# Patient Record
Sex: Female | Born: 1940 | Race: White | Hispanic: No | Smoking: Never smoker
Health system: Southern US, Community
[De-identification: ages and names within clinical notes are randomized; demographics above are authoritative.]

## PROBLEM LIST (undated history)

## (undated) DIAGNOSIS — T8859XA Other complications of anesthesia, initial encounter: Secondary | ICD-10-CM

## (undated) DIAGNOSIS — M545 Low back pain, unspecified: Secondary | ICD-10-CM

## (undated) DIAGNOSIS — I639 Cerebral infarction, unspecified: Secondary | ICD-10-CM

## (undated) DIAGNOSIS — M1990 Unspecified osteoarthritis, unspecified site: Secondary | ICD-10-CM

## (undated) DIAGNOSIS — F329 Major depressive disorder, single episode, unspecified: Secondary | ICD-10-CM

## (undated) DIAGNOSIS — I2510 Atherosclerotic heart disease of native coronary artery without angina pectoris: Principal | ICD-10-CM

## (undated) DIAGNOSIS — G8929 Other chronic pain: Secondary | ICD-10-CM

## (undated) DIAGNOSIS — R0600 Dyspnea, unspecified: Secondary | ICD-10-CM

## (undated) DIAGNOSIS — Z85828 Personal history of other malignant neoplasm of skin: Secondary | ICD-10-CM

## (undated) DIAGNOSIS — R112 Nausea with vomiting, unspecified: Secondary | ICD-10-CM

## (undated) DIAGNOSIS — R0609 Other forms of dyspnea: Secondary | ICD-10-CM

## (undated) DIAGNOSIS — I10 Essential (primary) hypertension: Secondary | ICD-10-CM

## (undated) DIAGNOSIS — R519 Headache, unspecified: Secondary | ICD-10-CM

## (undated) DIAGNOSIS — I252 Old myocardial infarction: Secondary | ICD-10-CM

## (undated) DIAGNOSIS — N6019 Diffuse cystic mastopathy of unspecified breast: Secondary | ICD-10-CM

## (undated) DIAGNOSIS — D509 Iron deficiency anemia, unspecified: Secondary | ICD-10-CM

## (undated) DIAGNOSIS — T4145XA Adverse effect of unspecified anesthetic, initial encounter: Secondary | ICD-10-CM

## (undated) DIAGNOSIS — F419 Anxiety disorder, unspecified: Secondary | ICD-10-CM

## (undated) DIAGNOSIS — M353 Polymyalgia rheumatica: Secondary | ICD-10-CM

## (undated) DIAGNOSIS — C801 Malignant (primary) neoplasm, unspecified: Secondary | ICD-10-CM

## (undated) DIAGNOSIS — K219 Gastro-esophageal reflux disease without esophagitis: Secondary | ICD-10-CM

## (undated) DIAGNOSIS — L409 Psoriasis, unspecified: Secondary | ICD-10-CM

## (undated) DIAGNOSIS — Z9861 Coronary angioplasty status: Principal | ICD-10-CM

## (undated) DIAGNOSIS — K279 Peptic ulcer, site unspecified, unspecified as acute or chronic, without hemorrhage or perforation: Secondary | ICD-10-CM

## (undated) DIAGNOSIS — D649 Anemia, unspecified: Secondary | ICD-10-CM

## (undated) DIAGNOSIS — F32A Depression, unspecified: Secondary | ICD-10-CM

## (undated) DIAGNOSIS — J189 Pneumonia, unspecified organism: Secondary | ICD-10-CM

## (undated) DIAGNOSIS — R51 Headache: Secondary | ICD-10-CM

## (undated) DIAGNOSIS — Z9889 Other specified postprocedural states: Secondary | ICD-10-CM

## (undated) DIAGNOSIS — Z9289 Personal history of other medical treatment: Secondary | ICD-10-CM

## (undated) DIAGNOSIS — M8580 Other specified disorders of bone density and structure, unspecified site: Secondary | ICD-10-CM

## (undated) HISTORY — PX: JOINT REPLACEMENT: SHX530

## (undated) HISTORY — DX: Old myocardial infarction: I25.2

## (undated) HISTORY — PX: CATARACT EXTRACTION W/ INTRAOCULAR LENS  IMPLANT, BILATERAL: SHX1307

## (undated) HISTORY — DX: Peptic ulcer, site unspecified, unspecified as acute or chronic, without hemorrhage or perforation: K27.9

## (undated) HISTORY — DX: Anemia, unspecified: D64.9

## (undated) HISTORY — DX: Depression, unspecified: F32.A

## (undated) HISTORY — DX: Dyspnea, unspecified: R06.00

## (undated) HISTORY — PX: VAGINAL HYSTERECTOMY: SUR661

## (undated) HISTORY — PX: MOHS SURGERY: SUR867

## (undated) HISTORY — DX: Other specified disorders of bone density and structure, unspecified site: M85.80

## (undated) HISTORY — PX: DILATION AND CURETTAGE OF UTERUS: SHX78

## (undated) HISTORY — DX: Major depressive disorder, single episode, unspecified: F32.9

## (undated) HISTORY — DX: Diffuse cystic mastopathy of unspecified breast: N60.19

## (undated) HISTORY — DX: Personal history of other malignant neoplasm of skin: Z85.828

## (undated) HISTORY — PX: LUMBAR DISC SURGERY: SHX700

## (undated) HISTORY — DX: Anxiety disorder, unspecified: F41.9

## (undated) HISTORY — DX: Essential (primary) hypertension: I10

## (undated) HISTORY — PX: TRANSTHORACIC ECHOCARDIOGRAM: SHX275

## (undated) HISTORY — DX: Coronary angioplasty status: Z98.61

## (undated) HISTORY — PX: BACK SURGERY: SHX140

## (undated) HISTORY — DX: Psoriasis, unspecified: L40.9

## (undated) HISTORY — DX: Other forms of dyspnea: R06.09

## (undated) HISTORY — PX: KNEE ARTHROSCOPY: SHX127

## (undated) HISTORY — PX: NASAL SINUS SURGERY: SHX719

## (undated) HISTORY — PX: APPENDECTOMY: SHX54

## (undated) HISTORY — PX: BREAST BIOPSY: SHX20

## (undated) HISTORY — DX: Unspecified osteoarthritis, unspecified site: M19.90

## (undated) HISTORY — PX: TUBAL LIGATION: SHX77

## (undated) HISTORY — PX: EXCISIONAL HEMORRHOIDECTOMY: SHX1541

## (undated) HISTORY — DX: Atherosclerotic heart disease of native coronary artery without angina pectoris: I25.10

---

## 1999-05-08 ENCOUNTER — Ambulatory Visit (HOSPITAL_BASED_OUTPATIENT_CLINIC_OR_DEPARTMENT_OTHER): Admission: RE | Admit: 1999-05-08 | Discharge: 1999-05-08 | Payer: Self-pay | Admitting: Plastic Surgery

## 1999-08-25 HISTORY — PX: CARDIAC CATHETERIZATION: SHX172

## 1999-11-24 ENCOUNTER — Encounter: Payer: Self-pay | Admitting: Internal Medicine

## 2000-03-17 ENCOUNTER — Encounter: Payer: Self-pay | Admitting: *Deleted

## 2000-03-17 ENCOUNTER — Ambulatory Visit (HOSPITAL_COMMUNITY): Admission: RE | Admit: 2000-03-17 | Discharge: 2000-03-17 | Payer: Self-pay | Admitting: *Deleted

## 2000-08-05 ENCOUNTER — Other Ambulatory Visit: Admission: RE | Admit: 2000-08-05 | Discharge: 2000-08-05 | Payer: Self-pay | Admitting: Internal Medicine

## 2000-08-05 ENCOUNTER — Encounter (INDEPENDENT_AMBULATORY_CARE_PROVIDER_SITE_OTHER): Payer: Self-pay | Admitting: Specialist

## 2000-08-10 ENCOUNTER — Ambulatory Visit (HOSPITAL_COMMUNITY): Admission: RE | Admit: 2000-08-10 | Discharge: 2000-08-10 | Payer: Self-pay | Admitting: Internal Medicine

## 2000-08-10 ENCOUNTER — Encounter: Payer: Self-pay | Admitting: Internal Medicine

## 2001-06-29 ENCOUNTER — Other Ambulatory Visit: Admission: RE | Admit: 2001-06-29 | Discharge: 2001-06-29 | Payer: Self-pay | Admitting: Family Medicine

## 2001-08-25 LAB — HM DEXA SCAN: HM Dexa Scan: NORMAL

## 2002-01-12 ENCOUNTER — Encounter: Payer: Self-pay | Admitting: Family Medicine

## 2002-06-06 ENCOUNTER — Encounter: Payer: Self-pay | Admitting: Family Medicine

## 2002-08-21 ENCOUNTER — Encounter: Payer: Self-pay | Admitting: Orthopedic Surgery

## 2002-08-25 ENCOUNTER — Encounter: Payer: Self-pay | Admitting: Orthopedic Surgery

## 2002-08-25 ENCOUNTER — Encounter (INDEPENDENT_AMBULATORY_CARE_PROVIDER_SITE_OTHER): Payer: Self-pay | Admitting: *Deleted

## 2002-08-27 ENCOUNTER — Inpatient Hospital Stay (HOSPITAL_COMMUNITY): Admission: RE | Admit: 2002-08-27 | Discharge: 2002-08-28 | Payer: Self-pay | Admitting: Orthopedic Surgery

## 2003-04-05 ENCOUNTER — Encounter (INDEPENDENT_AMBULATORY_CARE_PROVIDER_SITE_OTHER): Payer: Self-pay | Admitting: Specialist

## 2003-04-05 ENCOUNTER — Encounter: Payer: Self-pay | Admitting: Orthopedic Surgery

## 2003-04-06 ENCOUNTER — Inpatient Hospital Stay (HOSPITAL_COMMUNITY): Admission: RE | Admit: 2003-04-06 | Discharge: 2003-04-07 | Payer: Self-pay | Admitting: Orthopedic Surgery

## 2003-07-18 ENCOUNTER — Encounter: Payer: Self-pay | Admitting: Family Medicine

## 2003-07-18 ENCOUNTER — Other Ambulatory Visit: Admission: RE | Admit: 2003-07-18 | Discharge: 2003-07-18 | Payer: Self-pay | Admitting: Family Medicine

## 2003-08-25 HISTORY — PX: LAPAROSCOPIC CHOLECYSTECTOMY: SUR755

## 2004-02-20 ENCOUNTER — Encounter (INDEPENDENT_AMBULATORY_CARE_PROVIDER_SITE_OTHER): Payer: Self-pay | Admitting: Specialist

## 2004-02-22 ENCOUNTER — Inpatient Hospital Stay (HOSPITAL_COMMUNITY): Admission: RE | Admit: 2004-02-22 | Discharge: 2004-02-23 | Payer: Self-pay | Admitting: Orthopedic Surgery

## 2004-02-29 ENCOUNTER — Inpatient Hospital Stay (HOSPITAL_COMMUNITY): Admission: EM | Admit: 2004-02-29 | Discharge: 2004-03-04 | Payer: Self-pay | Admitting: Emergency Medicine

## 2004-03-01 ENCOUNTER — Encounter (INDEPENDENT_AMBULATORY_CARE_PROVIDER_SITE_OTHER): Payer: Self-pay | Admitting: *Deleted

## 2004-07-21 ENCOUNTER — Ambulatory Visit: Payer: Self-pay | Admitting: Family Medicine

## 2004-12-10 ENCOUNTER — Ambulatory Visit: Payer: Self-pay | Admitting: Family Medicine

## 2005-06-12 ENCOUNTER — Ambulatory Visit: Payer: Self-pay | Admitting: Family Medicine

## 2005-07-31 ENCOUNTER — Ambulatory Visit: Payer: Self-pay | Admitting: Family Medicine

## 2005-09-14 ENCOUNTER — Ambulatory Visit: Payer: Self-pay | Admitting: Family Medicine

## 2005-10-05 ENCOUNTER — Ambulatory Visit: Payer: Self-pay | Admitting: Family Medicine

## 2005-10-29 ENCOUNTER — Ambulatory Visit: Payer: Self-pay | Admitting: Family Medicine

## 2006-04-06 ENCOUNTER — Ambulatory Visit: Payer: Self-pay | Admitting: Family Medicine

## 2006-05-04 ENCOUNTER — Ambulatory Visit: Payer: Self-pay | Admitting: Gastroenterology

## 2006-05-11 ENCOUNTER — Emergency Department (HOSPITAL_COMMUNITY): Admission: EM | Admit: 2006-05-11 | Discharge: 2006-05-12 | Payer: Self-pay | Admitting: Emergency Medicine

## 2006-05-12 ENCOUNTER — Encounter (INDEPENDENT_AMBULATORY_CARE_PROVIDER_SITE_OTHER): Payer: Self-pay | Admitting: *Deleted

## 2006-05-12 ENCOUNTER — Ambulatory Visit: Payer: Self-pay | Admitting: Gastroenterology

## 2006-06-15 ENCOUNTER — Ambulatory Visit: Payer: Self-pay | Admitting: Gastroenterology

## 2006-12-23 ENCOUNTER — Ambulatory Visit: Payer: Self-pay | Admitting: Family Medicine

## 2006-12-23 ENCOUNTER — Telehealth (INDEPENDENT_AMBULATORY_CARE_PROVIDER_SITE_OTHER): Payer: Self-pay | Admitting: *Deleted

## 2006-12-23 LAB — CONVERTED CEMR LAB
Bacteria, UA: 0
Bilirubin Urine: NEGATIVE
Ketones, urine, test strip: NEGATIVE
Protein, U semiquant: NEGATIVE
RBC / HPF: 5
Urobilinogen, UA: 0.2

## 2007-01-10 ENCOUNTER — Encounter: Payer: Self-pay | Admitting: Family Medicine

## 2007-01-10 DIAGNOSIS — H349 Unspecified retinal vascular occlusion: Secondary | ICD-10-CM | POA: Insufficient documentation

## 2007-01-10 DIAGNOSIS — F329 Major depressive disorder, single episode, unspecified: Secondary | ICD-10-CM

## 2007-01-10 DIAGNOSIS — I10 Essential (primary) hypertension: Secondary | ICD-10-CM

## 2007-01-10 DIAGNOSIS — E786 Lipoprotein deficiency: Secondary | ICD-10-CM

## 2007-01-10 DIAGNOSIS — F3289 Other specified depressive episodes: Secondary | ICD-10-CM | POA: Insufficient documentation

## 2007-01-10 DIAGNOSIS — K869 Disease of pancreas, unspecified: Secondary | ICD-10-CM | POA: Insufficient documentation

## 2007-01-10 DIAGNOSIS — Z85828 Personal history of other malignant neoplasm of skin: Secondary | ICD-10-CM | POA: Insufficient documentation

## 2007-01-10 DIAGNOSIS — M19019 Primary osteoarthritis, unspecified shoulder: Secondary | ICD-10-CM | POA: Insufficient documentation

## 2007-01-10 DIAGNOSIS — K279 Peptic ulcer, site unspecified, unspecified as acute or chronic, without hemorrhage or perforation: Secondary | ICD-10-CM | POA: Insufficient documentation

## 2007-01-10 DIAGNOSIS — I2510 Atherosclerotic heart disease of native coronary artery without angina pectoris: Secondary | ICD-10-CM | POA: Insufficient documentation

## 2007-01-10 DIAGNOSIS — E785 Hyperlipidemia, unspecified: Secondary | ICD-10-CM | POA: Insufficient documentation

## 2007-01-18 ENCOUNTER — Encounter: Payer: Self-pay | Admitting: Family Medicine

## 2007-01-25 ENCOUNTER — Ambulatory Visit: Payer: Self-pay | Admitting: Family Medicine

## 2007-01-27 LAB — CONVERTED CEMR LAB
ALT: 20 units/L (ref 0–40)
BUN: 11 mg/dL (ref 6–23)
Basophils Relative: 0.8 % (ref 0.0–1.0)
CO2: 29 meq/L (ref 19–32)
Creatinine, Ser: 0.9 mg/dL (ref 0.4–1.2)
Eosinophils Absolute: 0.1 10*3/uL (ref 0.0–0.6)
Eosinophils Relative: 2.2 % (ref 0.0–5.0)
Glucose, Bld: 96 mg/dL (ref 70–99)
HCT: 34.2 % — ABNORMAL LOW (ref 36.0–46.0)
Hemoglobin: 11.7 g/dL — ABNORMAL LOW (ref 12.0–15.0)
Lymphocytes Relative: 32.1 % (ref 12.0–46.0)
MCV: 84.9 fL (ref 78.0–100.0)
Monocytes Absolute: 0.5 10*3/uL (ref 0.2–0.7)
Neutro Abs: 3.6 10*3/uL (ref 1.4–7.7)
Neutrophils Relative %: 57.1 % (ref 43.0–77.0)
Phosphorus: 4.5 mg/dL (ref 2.3–4.6)
Platelets: 290 10*3/uL (ref 150–400)
VLDL: 50 mg/dL — ABNORMAL HIGH (ref 0–40)
WBC: 6.3 10*3/uL (ref 4.5–10.5)

## 2007-02-01 ENCOUNTER — Encounter: Payer: Self-pay | Admitting: Family Medicine

## 2007-02-07 ENCOUNTER — Encounter: Payer: Self-pay | Admitting: Family Medicine

## 2007-02-11 DIAGNOSIS — Z87898 Personal history of other specified conditions: Secondary | ICD-10-CM | POA: Insufficient documentation

## 2007-02-21 ENCOUNTER — Encounter (INDEPENDENT_AMBULATORY_CARE_PROVIDER_SITE_OTHER): Payer: Self-pay | Admitting: *Deleted

## 2007-02-21 DIAGNOSIS — M8580 Other specified disorders of bone density and structure, unspecified site: Secondary | ICD-10-CM

## 2007-02-21 DIAGNOSIS — T380X5A Adverse effect of glucocorticoids and synthetic analogues, initial encounter: Secondary | ICD-10-CM

## 2007-02-23 ENCOUNTER — Ambulatory Visit: Payer: Self-pay | Admitting: Family Medicine

## 2007-02-23 LAB — FECAL OCCULT BLOOD, GUAIAC: Fecal Occult Blood: NEGATIVE

## 2007-02-28 ENCOUNTER — Encounter (INDEPENDENT_AMBULATORY_CARE_PROVIDER_SITE_OTHER): Payer: Self-pay | Admitting: *Deleted

## 2007-05-17 ENCOUNTER — Telehealth (INDEPENDENT_AMBULATORY_CARE_PROVIDER_SITE_OTHER): Payer: Self-pay | Admitting: *Deleted

## 2007-06-03 ENCOUNTER — Encounter: Payer: Self-pay | Admitting: Family Medicine

## 2007-06-03 ENCOUNTER — Telehealth: Payer: Self-pay | Admitting: Family Medicine

## 2007-06-03 ENCOUNTER — Ambulatory Visit: Payer: Self-pay | Admitting: Family Medicine

## 2007-06-03 LAB — CONVERTED CEMR LAB
Urine crystals, microscopic: 0 /hpf
Yeast, UA: 0

## 2007-06-04 ENCOUNTER — Encounter: Payer: Self-pay | Admitting: Family Medicine

## 2007-06-21 ENCOUNTER — Ambulatory Visit: Payer: Self-pay | Admitting: Family Medicine

## 2007-06-30 ENCOUNTER — Telehealth: Payer: Self-pay | Admitting: Family Medicine

## 2007-07-18 ENCOUNTER — Telehealth: Payer: Self-pay | Admitting: Family Medicine

## 2007-08-01 ENCOUNTER — Ambulatory Visit: Payer: Self-pay | Admitting: Family Medicine

## 2007-08-01 LAB — CONVERTED CEMR LAB
Casts: 0 /lpf
Yeast, UA: 0

## 2007-08-02 ENCOUNTER — Encounter: Payer: Self-pay | Admitting: Family Medicine

## 2007-08-07 DIAGNOSIS — N390 Urinary tract infection, site not specified: Secondary | ICD-10-CM

## 2007-08-25 HISTORY — PX: TOTAL KNEE ARTHROPLASTY: SHX125

## 2007-08-26 ENCOUNTER — Encounter: Payer: Self-pay | Admitting: Family Medicine

## 2007-09-02 ENCOUNTER — Encounter: Payer: Self-pay | Admitting: Family Medicine

## 2007-12-12 ENCOUNTER — Encounter: Payer: Self-pay | Admitting: Family Medicine

## 2008-01-12 ENCOUNTER — Encounter: Payer: Self-pay | Admitting: Family Medicine

## 2008-01-19 ENCOUNTER — Encounter (INDEPENDENT_AMBULATORY_CARE_PROVIDER_SITE_OTHER): Payer: Self-pay | Admitting: *Deleted

## 2008-01-26 ENCOUNTER — Ambulatory Visit: Payer: Self-pay | Admitting: Family Medicine

## 2008-01-30 ENCOUNTER — Encounter (INDEPENDENT_AMBULATORY_CARE_PROVIDER_SITE_OTHER): Payer: Self-pay | Admitting: *Deleted

## 2008-01-30 LAB — CONVERTED CEMR LAB
AST: 19 units/L (ref 0–37)
Alkaline Phosphatase: 61 units/L (ref 39–117)
Basophils Absolute: 0.1 10*3/uL (ref 0.0–0.1)
Chloride: 104 meq/L (ref 96–112)
Creatinine, Ser: 0.8 mg/dL (ref 0.4–1.2)
Direct LDL: 151.1 mg/dL
Eosinophils Absolute: 0.1 10*3/uL (ref 0.0–0.7)
GFR calc non Af Amer: 76 mL/min
HDL: 39 mg/dL (ref >39.0)
MCHC: 34.5 g/dL (ref 30.0–36.0)
MCV: 87 fL (ref 78.0–100.0)
Neutrophils Relative %: 56.3 % (ref 43.0–77.0)
Platelets: 252 10*3/uL (ref 150–400)
Potassium: 4.3 meq/L (ref 3.5–5.1)
RDW: 13.1 % (ref 11.5–14.6)
Sodium: 141 meq/L (ref 135–145)
Total Bilirubin: 0.8 mg/dL (ref 0.3–1.2)
Triglycerides: 293 mg/dL (ref 0–149)

## 2008-02-22 ENCOUNTER — Encounter: Payer: Self-pay | Admitting: Family Medicine

## 2008-03-07 ENCOUNTER — Encounter: Payer: Self-pay | Admitting: Family Medicine

## 2008-03-12 ENCOUNTER — Ambulatory Visit: Payer: Self-pay | Admitting: Family Medicine

## 2008-03-12 DIAGNOSIS — E559 Vitamin D deficiency, unspecified: Secondary | ICD-10-CM

## 2008-03-15 ENCOUNTER — Inpatient Hospital Stay (HOSPITAL_COMMUNITY): Admission: RE | Admit: 2008-03-15 | Discharge: 2008-03-20 | Payer: Self-pay | Admitting: Orthopedic Surgery

## 2008-03-16 LAB — CONVERTED CEMR LAB: Vit D, 1,25-Dihydroxy: 27 — ABNORMAL LOW (ref 30–89)

## 2008-04-21 ENCOUNTER — Ambulatory Visit: Payer: Self-pay | Admitting: Internal Medicine

## 2008-05-09 ENCOUNTER — Encounter: Payer: Self-pay | Admitting: Family Medicine

## 2008-05-10 ENCOUNTER — Telehealth (INDEPENDENT_AMBULATORY_CARE_PROVIDER_SITE_OTHER): Payer: Self-pay | Admitting: *Deleted

## 2008-06-05 ENCOUNTER — Encounter (INDEPENDENT_AMBULATORY_CARE_PROVIDER_SITE_OTHER): Payer: Self-pay | Admitting: *Deleted

## 2008-06-14 ENCOUNTER — Ambulatory Visit (HOSPITAL_COMMUNITY): Admission: RE | Admit: 2008-06-14 | Discharge: 2008-06-14 | Payer: Self-pay | Admitting: Orthopedic Surgery

## 2008-07-26 ENCOUNTER — Encounter: Payer: Self-pay | Admitting: Family Medicine

## 2008-08-24 HISTORY — PX: HIP ARTHROPLASTY: SHX981

## 2009-01-14 ENCOUNTER — Encounter: Payer: Self-pay | Admitting: Family Medicine

## 2009-01-18 ENCOUNTER — Encounter: Payer: Self-pay | Admitting: Family Medicine

## 2009-01-18 LAB — HM MAMMOGRAPHY: HM Mammogram: NORMAL

## 2009-01-28 ENCOUNTER — Ambulatory Visit: Payer: Self-pay | Admitting: Family Medicine

## 2009-01-30 LAB — CONVERTED CEMR LAB
ALT: 16 units/L (ref 0–35)
Albumin: 3.5 g/dL (ref 3.5–5.2)
BUN: 9 mg/dL (ref 6–23)
Basophils Absolute: 0 10*3/uL (ref 0.0–0.1)
Chloride: 111 meq/L (ref 96–112)
Cholesterol: 249 mg/dL — ABNORMAL HIGH (ref 0–200)
Eosinophils Absolute: 0.1 10*3/uL (ref 0.0–0.7)
HCT: 31.7 % — ABNORMAL LOW (ref 36.0–46.0)
Lymphs Abs: 1.2 10*3/uL (ref 0.7–4.0)
MCV: 85.2 fL (ref 78.0–100.0)
Monocytes Absolute: 0.3 10*3/uL (ref 0.1–1.0)
Monocytes Relative: 8.2 % (ref 3.0–12.0)
Phosphorus: 3.7 mg/dL (ref 2.3–4.6)
Platelets: 218 10*3/uL (ref 150.0–400.0)
RDW: 12.4 % (ref 11.5–14.6)
VLDL: 34 mg/dL (ref 0.0–40.0)

## 2009-01-31 ENCOUNTER — Ambulatory Visit: Payer: Self-pay | Admitting: Family Medicine

## 2009-02-05 LAB — CONVERTED CEMR LAB
Ferritin: 104.6 ng/mL (ref 10.0–291.0)
Iron: 37 ug/dL — ABNORMAL LOW (ref 42–145)
Transferrin: 177.5 mg/dL — ABNORMAL LOW (ref 212.0–360.0)

## 2009-02-06 ENCOUNTER — Ambulatory Visit: Payer: Self-pay | Admitting: Family Medicine

## 2009-02-06 ENCOUNTER — Ambulatory Visit: Payer: Self-pay | Admitting: Oncology

## 2009-02-06 LAB — CONVERTED CEMR LAB: OCCULT 1: NEGATIVE

## 2009-02-08 ENCOUNTER — Encounter (INDEPENDENT_AMBULATORY_CARE_PROVIDER_SITE_OTHER): Payer: Self-pay | Admitting: *Deleted

## 2009-02-28 ENCOUNTER — Encounter: Payer: Self-pay | Admitting: Family Medicine

## 2009-02-28 LAB — CBC WITH DIFFERENTIAL (CANCER CENTER ONLY)
BASO#: 0 10*3/uL (ref 0.0–0.2)
EOS%: 3.4 % (ref 0.0–7.0)
HGB: 11.7 g/dL (ref 11.6–15.9)
MCH: 28.1 pg (ref 26.0–34.0)
MCHC: 34.7 g/dL (ref 32.0–36.0)
MONO%: 6.3 % (ref 0.0–13.0)
NEUT#: 2.5 10*3/uL (ref 1.5–6.5)

## 2009-02-28 LAB — MORPHOLOGY - CHCC SATELLITE

## 2009-02-28 LAB — CMP (CANCER CENTER ONLY)
Albumin: 3.3 g/dL (ref 3.3–5.5)
BUN, Bld: 12 mg/dL (ref 7–22)
CO2: 26 mEq/L (ref 18–33)
Calcium: 9.1 mg/dL (ref 8.0–10.3)
Chloride: 98 mEq/L (ref 98–108)
Glucose, Bld: 103 mg/dL (ref 73–118)
Potassium: 4 mEq/L (ref 3.3–4.7)
Total Protein: 7 g/dL (ref 6.4–8.1)

## 2009-03-04 DIAGNOSIS — K222 Esophageal obstruction: Secondary | ICD-10-CM

## 2009-03-04 DIAGNOSIS — K219 Gastro-esophageal reflux disease without esophagitis: Secondary | ICD-10-CM

## 2009-03-04 LAB — PROTEIN ELECTROPHORESIS, SERUM
Albumin ELP: 58.4 % (ref 55.8–66.1)
Alpha-1-Globulin: 5.2 % — ABNORMAL HIGH (ref 2.9–4.9)
Beta 2: 4.9 % (ref 3.2–6.5)
Beta Globulin: 5.1 % (ref 4.7–7.2)

## 2009-03-04 LAB — IRON AND TIBC
Iron: 54 ug/dL (ref 42–145)
UIBC: 200 ug/dL

## 2009-03-04 LAB — FERRITIN: Ferritin: 167 ng/mL (ref 10–291)

## 2009-03-04 LAB — LACTATE DEHYDROGENASE: LDH: 132 U/L (ref 94–250)

## 2009-03-07 ENCOUNTER — Ambulatory Visit: Payer: Self-pay | Admitting: Gastroenterology

## 2009-03-07 DIAGNOSIS — R1319 Other dysphagia: Secondary | ICD-10-CM

## 2009-03-24 HISTORY — PX: ESOPHAGOGASTRODUODENOSCOPY: SHX1529

## 2009-04-01 ENCOUNTER — Ambulatory Visit: Payer: Self-pay | Admitting: Gastroenterology

## 2009-04-01 LAB — HM COLONOSCOPY

## 2009-04-02 ENCOUNTER — Ambulatory Visit: Payer: Self-pay | Admitting: Oncology

## 2009-04-04 ENCOUNTER — Encounter: Payer: Self-pay | Admitting: Family Medicine

## 2009-04-04 LAB — CBC WITH DIFFERENTIAL (CANCER CENTER ONLY)
BASO#: 0 10*3/uL (ref 0.0–0.2)
EOS%: 1.5 % (ref 0.0–7.0)
HCT: 32.9 % — ABNORMAL LOW (ref 34.8–46.6)
HGB: 11.5 g/dL — ABNORMAL LOW (ref 11.6–15.9)
LYMPH%: 29.8 % (ref 14.0–48.0)
MCH: 28.2 pg (ref 26.0–34.0)
MCHC: 35.1 g/dL (ref 32.0–36.0)
MCV: 80 fL — ABNORMAL LOW (ref 81–101)
MONO%: 4.7 % (ref 0.0–13.0)
NEUT%: 63.6 % (ref 39.6–80.0)

## 2009-06-26 ENCOUNTER — Encounter: Payer: Self-pay | Admitting: Family Medicine

## 2009-07-02 ENCOUNTER — Encounter: Payer: Self-pay | Admitting: Family Medicine

## 2009-07-03 ENCOUNTER — Ambulatory Visit: Payer: Self-pay | Admitting: Family Medicine

## 2009-07-31 ENCOUNTER — Inpatient Hospital Stay (HOSPITAL_COMMUNITY): Admission: RE | Admit: 2009-07-31 | Discharge: 2009-08-05 | Payer: Self-pay | Admitting: Orthopedic Surgery

## 2009-12-19 ENCOUNTER — Encounter: Payer: Self-pay | Admitting: Family Medicine

## 2010-01-07 ENCOUNTER — Encounter: Payer: Self-pay | Admitting: Family Medicine

## 2010-02-26 ENCOUNTER — Encounter: Payer: Self-pay | Admitting: Family Medicine

## 2010-02-28 DIAGNOSIS — R928 Other abnormal and inconclusive findings on diagnostic imaging of breast: Secondary | ICD-10-CM | POA: Insufficient documentation

## 2010-03-05 ENCOUNTER — Encounter: Payer: Self-pay | Admitting: Family Medicine

## 2010-03-11 ENCOUNTER — Encounter: Payer: Self-pay | Admitting: Family Medicine

## 2010-03-27 ENCOUNTER — Ambulatory Visit: Payer: Self-pay | Admitting: Oncology

## 2010-04-03 ENCOUNTER — Encounter: Payer: Self-pay | Admitting: Family Medicine

## 2010-04-03 LAB — CBC WITH DIFFERENTIAL (CANCER CENTER ONLY)
BASO#: 0 10*3/uL (ref 0.0–0.2)
Eosinophils Absolute: 0.1 10*3/uL (ref 0.0–0.5)
HGB: 11.5 g/dL — ABNORMAL LOW (ref 11.6–15.9)
LYMPH%: 36.4 % (ref 14.0–48.0)
MCH: 28.1 pg (ref 26.0–34.0)
MCV: 81 fL (ref 81–101)
MONO%: 7.3 % (ref 0.0–13.0)
NEUT%: 53.7 % (ref 39.6–80.0)
RBC: 4.09 10*6/uL (ref 3.70–5.32)

## 2010-04-16 ENCOUNTER — Ambulatory Visit: Payer: Self-pay | Admitting: Family Medicine

## 2010-04-16 DIAGNOSIS — F411 Generalized anxiety disorder: Secondary | ICD-10-CM

## 2010-04-16 DIAGNOSIS — D638 Anemia in other chronic diseases classified elsewhere: Secondary | ICD-10-CM

## 2010-04-17 ENCOUNTER — Encounter: Payer: Self-pay | Admitting: Family Medicine

## 2010-04-17 LAB — CONVERTED CEMR LAB
ALT: 12 units/L (ref 0–35)
BUN: 13 mg/dL (ref 6–23)
Basophils Absolute: 0.1 10*3/uL (ref 0.0–0.1)
Bilirubin, Direct: 0.2 mg/dL (ref 0.0–0.3)
Calcium: 9.5 mg/dL (ref 8.4–10.5)
Creatinine, Ser: 0.9 mg/dL (ref 0.4–1.2)
Direct LDL: 199.5 mg/dL
Eosinophils Relative: 1.5 % (ref 0.0–5.0)
GFR calc non Af Amer: 68.29 mL/min (ref >60)
Glucose, Bld: 100 mg/dL — ABNORMAL HIGH (ref 70–99)
HDL: 49.5 mg/dL (ref >39.00)
MCV: 84.7 fL (ref 78.0–100.0)
Monocytes Absolute: 0.4 10*3/uL (ref 0.1–1.0)
Neutrophils Relative %: 55.1 % (ref 43.0–77.0)
Platelets: 251 10*3/uL (ref 150.0–400.0)
RDW: 13.8 % (ref 11.5–14.6)
Sodium: 140 meq/L (ref 135–145)
Total Bilirubin: 0.7 mg/dL (ref 0.3–1.2)
Total CHOL/HDL Ratio: 6
Triglycerides: 244 mg/dL — ABNORMAL HIGH (ref 0.0–149.0)
Vit D, 25-Hydroxy: 22 ng/mL — ABNORMAL LOW (ref 30–89)
WBC: 5.6 10*3/uL (ref 4.5–10.5)

## 2010-06-04 ENCOUNTER — Ambulatory Visit: Payer: Self-pay | Admitting: Family Medicine

## 2010-06-10 LAB — CONVERTED CEMR LAB
ALT: 20 units/L (ref 0–35)
Cholesterol: 186 mg/dL (ref 0–200)
HDL: 43.7 mg/dL (ref >39.00)
Triglycerides: 184 mg/dL — ABNORMAL HIGH (ref 0.0–149.0)
VLDL: 36.8 mg/dL (ref 0.0–40.0)

## 2010-07-08 ENCOUNTER — Ambulatory Visit: Payer: Self-pay | Admitting: Family Medicine

## 2010-07-23 ENCOUNTER — Ambulatory Visit: Payer: Self-pay | Admitting: Family Medicine

## 2010-09-08 ENCOUNTER — Encounter: Payer: Self-pay | Admitting: Family Medicine

## 2010-09-11 ENCOUNTER — Encounter: Payer: Self-pay | Admitting: Family Medicine

## 2010-09-16 ENCOUNTER — Encounter (INDEPENDENT_AMBULATORY_CARE_PROVIDER_SITE_OTHER): Payer: Self-pay | Admitting: *Deleted

## 2010-09-23 NOTE — Letter (Signed)
Summary: [facility]  [facility]   Imported By: Edmonia James 01/14/2010 10:53:38  _____________________________________________________________________  External Attachment:    Type:   Image     Comment:   External Document

## 2010-09-23 NOTE — Assessment & Plan Note (Signed)
Summary: 3 month followup blood pressure/anxiety/rbh   Vital Signs:  Patient profile:   70 year old female Height:      64 inches Weight:      192.25 pounds BMI:     33.12 Temp:     97.9 degrees F oral Pulse rate:   76 / minute Pulse rhythm:   regular BP sitting:   124 / 66  (left arm) Cuff size:   large  Vitals Entered By: Ozzie Hoyle LPN (November 30, 624THL 8:39 AM) CC: three month f/u    History of Present Illness: here for 3 mo mo f/u of HTN and anxiety   wt is up 4 lb   bp great at 124/60 no side effects from losartan   on buspar 1/2 of 15 mg two times a day   she is ok overall  anxiety is about expected under the circumstances  she thinks the buspar is definitely helping   sleep is the major issue  cannot sleep  even when dead tired -- either cannot fall asleep or sleep 65min and wakes up  is light sleeper -- is awakened easily -- hears everything there are kids in the house  her mind never turns off  for now does not want a sleep aid  tried tylenol pm for a while    is not seeing a counselor  has severe situational stress was not helpful  she does talk to her best friend who lives away  does go to a church - that is supportive  she herself is not in danger of any sort   vit D is 31  gets d and calcium   she also needs px for zostavax to take to pharmacy  Allergies: 1)  ! Sulfa 2)  ! * Toprol 3)  ! Daypro 4)  ! Augmentin 5)  ! Lyrica 6)  ! Ace Inhibitors  Past History:  Past Medical History: Last updated: 04/16/2010 Anemia of chronic disease- saw heme/ now we are following Skin cancer, hx of- basal cell psoriasis Depression/ anxiety and stress rxn Hypertension Osteoarthritis Peptic ulcer disease/  Hpylori fibroxystic breasts Osteopenia   ortho-- Dr Collier Salina  heme Dr Chancy Milroy   Past Surgical History: Last updated: 04/02/2009 Cholecystectomy Hysterectomy- partial 1970s Back surgery x 3 Breast surgery Knee surgery x 3 Sinus  surgery Colonoscopy- tics (2001) EGD- ulcer, HP, GERD (2001) Treadmill- pos.  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 Eyes:  Denies blurring and eye irritation. CV:  Denies chest pain or discomfort and palpitations. Resp:  Denies cough and shortness of breath. GI:  Denies abdominal  pain, bloody stools, change in bowel habits, and indigestion. MS:  Complains of muscle aches and stiffness; denies cramps. Derm:  Denies itching and rash. Neuro:  Denies headaches, numbness, tingling, and tremors. Psych:  Complains of anxiety, depression, and panic attacks; denies sense of great danger and suicidal thoughts/plans. Endo:  Denies cold intolerance, excessive thirst, excessive urination, and heat intolerance. Heme:  Denies abnormal bruising and bleeding.  Physical Exam  General:  overweight but generally well appearing- wt gain noted   Head:  normocephalic, atraumatic, and no abnormalities observed.   Eyes:  vision grossly intact, pupils  equal, pupils round, and pupils reactive to light.  no conjunctival pallor, injection or icterus  Mouth:  pharynx pink and moist.   Neck:  supple with full rom and no masses or thyromegally, no JVD or carotid bruit  Chest Wall:  No deformities, masses, or tenderness noted. Lungs:  Normal respiratory effort, chest expands symmetrically. Lungs are clear to auscultation, no crackles or wheezes. Heart:  Normal rate and regular rhythm. S1 and S2 normal without gallop, murmur, click, rub or other extra sounds. Abdomen:  Bowel sounds positive,abdomen soft and non-tender without masses, organomegaly or hernias noted. no renal bruits Msk:  No deformity or scoliosis noted of thoracic or lumbar spine.   Pulses:  R and L carotid,radial,femoral,dorsalis pedis and posterior tibial pulses are full and equal bilaterally Extremities:  No clubbing, cyanosis, edema, or deformity noted with normal full range of motion of all joints.   Neurologic:  sensation intact to light touch, gait normal, and DTRs symmetrical and normal.   Skin:  Intact without suspicious lesions or rashes Cervical Nodes:  No lymphadenopathy noted Psych:  pt is tearful at times discussing her stressors at home nl eye contact and comm skills   Impression & Recommendations:  Problem # 1:  ANXIETY (ICD-300.00) Assessment Improved this is imp with buspar I enc her to start counseling again has good support  disc coping skills in detail today  Her updated medication list for this problem includes:    Buspirone Hcl 15 Mg Tabs (Buspirone hcl) .Marland Kitchen... 1/2 by mouth two times a day  Problem # 2:  HYPERTENSION (ICD-401.9) Assessment: Unchanged  well controlled with current med and no side eff disc healthy diet (low simple sugar/ choose complex carbs/ low sat fat) diet and exercise in detail  Her updated medication list for this problem includes:    Losartan Potassium-hctz 100-25 Mg Tabs (Losartan potassium-hctz) .Marland Kitchen... 1 by mouth once  daily  BP today: 124/66 Prior BP: 128/74 (04/16/2010)  Prior 10 Yr Risk Heart Disease: N/A (04/16/2010)  Labs Reviewed: K+: 4.6 (04/16/2010) Creat: : 0.9 (04/16/2010)   Chol: 186 (06/04/2010)   HDL: 43.70 (06/04/2010)   LDL: 106 (06/04/2010)   TG: 184.0 (06/04/2010)  Problem # 3:  UNSPECIFIED VITAMIN D DEFICIENCY (ICD-268.9) Assessment: Improved imp after high dose tx  asked pt to inc to 2000 international units daily to get it higher  disc imp to her bone health  Problem # 4:  Hx of HYPERCHOLESTEROLEMIA WITH HIGH HDL (ICD-272.0) Assessment: Unchanged  recent labs ok - LDL in low 100s disc low sat fat diet  Her updated medication list for this problem includes:    Lipitor 10 Mg Tabs (Atorvastatin calcium) .Marland Kitchen... 1 by mouth once daily  Labs Reviewed: SGOT: 23 (06/04/2010)   SGPT: 20 (06/04/2010)  Lipid Goals: Chol Goal: 200 (04/16/2010)   HDL Goal: 40 (04/16/2010)   LDL Goal: 100 (04/16/2010)   TG Goal:  150 (04/16/2010)  Prior 10 Yr Risk Heart Disease: N/A (04/16/2010)   HDL:43.70 (06/04/2010), 49.50 (04/16/2010)  LDL:106 (06/04/2010), DEL (01/26/2008)  Chol:186 (06/04/2010), 299 (04/16/2010)  Trig:184.0 (06/04/2010), 244.0 (04/16/2010)  Complete Medication List: 1)  Nexium 40 Mg Cpdr (Esomeprazole magnesium) .Marland Kitchen.. 1 by mouth once daily 2)  Aspirin 325 Mg Tabs (Aspirin) .Marland Kitchen.. 1tablet po once daily 3)  Caltrate 600 1500 Mg Tabs (Calcium carbonate) .... Two tablets by mouth daily 4)  Mvi With Iron  .Marland Kitchen.. 1 by mouth qd 5)  Levsin 0.125 Mg Tabs (Hyoscyamine sulfate) .... Take one by mouth as needed 6)  Cipro  .... As needed for uti from her urologist 7)  Buspirone Hcl 15 Mg Tabs (Buspirone hcl) .... 1/2 by mouth two times a day 8)  Losartan Potassium-hctz 100-25 Mg Tabs (Losartan potassium-hctz) .Marland Kitchen.. 1 by mouth once daily 9)  Vitamin D (ergocalciferol) 50000 Unit Caps (Ergocalciferol) .... One capsule by mouth every week for 10 weeks. 10)  Lipitor 10 Mg Tabs (Atorvastatin calcium)  .Marland Kitchen.. 1 by mouth once daily 11)  Zostavax 19400 Unt/0.46ml Solr (Zoster vaccine live) .... Inject once as directed  Patient Instructions: 1)  continue current medicines  2)  blood pressure is ok  3)  please call if you want to have referral to counseling at any time  4)  follow up in 6 months  5)  increase your vitamin D to 2000 international units daily  Prescriptions: ZOSTAVAX 96295 UNT/0.65ML SOLR (ZOSTER VACCINE LIVE) inject once as directed  #1 dose x 0   Entered and Authorized by:   Allena Earing MD   Signed by:   Allena Earing MD on 07/23/2010   Method used:   Print then Give to Patient   RxID:   [id number]    Orders Added: 1)  Est. Patient Level IV [id number]    Current Allergies (reviewed today): ! SULFA ! * TOPROL ! DAYPRO ! AUGMENTIN ! LYRICA ! ACE INHIBITORS

## 2010-09-23 NOTE — Letter (Signed)
Summary: [place]   Imported By: Edmonia James 04/15/2010 09:53:07  _____________________________________________________________________  External Attachment:    Type:   Image     Comment:   External Document

## 2010-09-23 NOTE — Assessment & Plan Note (Signed)
Summary: CHECK UP/CLE   Vital Signs:  Patient profile:   70 year old female Height:      64 inches Weight:      188.25 pounds BMI:     32.43 Temp:     98.2 degrees F oral Pulse rate:   84 / minute Pulse rhythm:   regular BP sitting:   128 / 74  (left arm) Cuff size:   large  Vitals Entered By: Ozzie Hoyle LPN (August 24, 624THL 9:32 AM) CC: check up LMP Hyst 1972, Lipid Management   History of Present Illness: here for check up of chronic med problems and to rev health mt list  has not been feeling very good  some episodes of feeling drained -- unsteady and dizzy -- like she is going to faint but she does not  tingly feeling going down  anxiety could play a role-- but does ? if plays part in this  no palpitations  not a hot flash  no LOC or falls  a few times in life years ago did faint   tremendous stress since mid summer  not seeing counselor and declines offer to refer  helps to scream and holler a bit and then cries a bit  she herself is not in danger  does not know if things will not help  does not talk to anyone -- husband is not sympathetic or helpful at all  legal and family problems  is caring for a 70 year old  does feel like she needs med for anx   wt is up 14lb  did have knee repl- that went fairly well    bp good control at 128/74-- is having dry cough from ace  ran out of it a week ago  has been taking her husb bp med instead -losartan hct 100-25 (not making her urinate more)   anemia is stable -- heme signed off / is chronic dz and we will follow for change  derm f/u- is due for that - hx of basal cell  she will make own appt   osteopenia -- dexa 6/08 on ca and D  Td09 ? pneumovax ? shingles status  hyst partial -- nl pap 04 no symptoms or problems   mam 5/10 then 7/11 showed small cyst -- and recommeded 6 mo f/u(already made)  no lumps on self exam   sees urol -- given as needed med for uti      Lipid Management History:  Positive NCEP/ATP III risk factors include female age 68 years old or older, hypertension, and ASHD (either angina/prior MI/prior CABG).  Negative NCEP/ATP III risk factors include non-tobacco-user status.     Allergies: 1)  ! Sulfa 2)  ! * Toprol 3)  ! Daypro 4)  ! Augmentin 5)  ! Lyrica 6)  ! Ace Inhibitors  Past History:  Past Surgical History: Last updated: 04/02/2009 Cholecystectomy Hysterectomy- partial 1970s Back surgery x 3 Breast surgery Knee surgery x 3 Sinus surgery Colonoscopy- tics (2001) EGD- ulcer, HP, GERD (2001) Treadmill- pos.  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 Eyes:  Denies blurring and eye irritation. CV:  Denies chest pain or discomfort and palpitations. Resp:  Denies cough, shortness of breath, and wheezing. GI:  Denies abdominal pain, change in bowel habits, diarrhea, indigestion, and nausea. GU:  Denies dysuria and  nocturia. MS:  Complains of joint pain, low back pain, and mid back pain; denies joint redness and joint swelling; TKN is doing well - now other knee is bothering her also chronic back pain. Derm:  Denies itching, lesion(s), poor wound healing, and rash. Neuro:  Denies numbness and tingling. Psych:  Denies anxiety and depression. Endo:  Denies cold intolerance, excessive thirst, excessive urination, and heat intolerance. Heme:  Denies abnormal bruising.  Physical Exam  General:  overweight but generally well appearing- wt gain noted   Head:  normocephalic, atraumatic, and no abnormalities observed.   Eyes:  vision grossly intact, pupils equal, pupils round, and pupils reactive to light.  no conjunctival pallor, injection or icterus  Ears:  R ear normal and L ear normal.   Nose:  no nasal discharge.   Mouth:  pharynx pink and moist.   Neck:  supple with full rom and no masses or thyromegally, no JVD or carotid bruit  Chest Wall:  No deformities, masses, or tenderness noted. Breasts:  No mass, nodules, thickening, tenderness, bulging, retraction, inflamation, nipple discharge or skin changes noted.   Lungs:  Normal respiratory effort, chest expands symmetrically. Lungs are clear to auscultation, no crackles or wheezes. Heart:  Normal rate and regular rhythm. S1 and S2 normal without gallop, murmur, click, rub or other extra sounds. Abdomen:  Bowel sounds positive,abdomen soft and non-tender without masses, organomegaly or hernias noted. Msk:  No deformity or scoliosis noted of thoracic or lumbar spine.  no new joint changes  Pulses:  R and L carotid,radial,femoral,dorsalis pedis and posterior tibial pulses are full and equal bilaterally Extremities:  No clubbing, cyanosis, edema, or deformity noted with normal full range of motion of all joints.   Neurologic:  sensation intact to light touch, gait normal, and DTRs symmetrical and normal.   Skin:  Intact without suspicious lesions or  rashes some skin tags  Cervical Nodes:  No lymphadenopathy noted Axillary Nodes:  No palpable lymphadenopathy Inguinal Nodes:  No significant adenopathy Psych:  is distressed and anxious today -- very talkative about her stressors  eye contact good/ insight is fair no SI  tearful at times   Impression & Recommendations:  Problem # 1:  GERD (ICD-530.81) Assessment Unchanged  this is stable  nexium refilled without change Her updated medication list for this problem includes:    Nexium 40 Mg Cpdr (Esomeprazole magnesium) .Marland Kitchen... 1 by mouth once daily    Levsin 0.125 Mg Tabs (Hyoscyamine sulfate) .Marland Kitchen... Take one by mouth as needed  Orders: Venipuncture [id number]) TLB-Lipid Panel (80061-LIPID) TLB-Renal Function Panel (80069-RENAL) TLB-CBC Platelet - w/Differential (85025-CBCD) TLB-Hepatic/Liver Function Pnl (80076-HEPATIC) TLB-TSH (Thyroid Stimulating Hormone) (84443-TSH) T-Vitamin D (25-Hydroxy) (  [phone]) Specimen Handling [phone])  Diagnostics Reviewed:  EGD: Location: [place]   (04/01/2009) Discussed lifestyle modifications, diet, antacids/medications, and preventive measures. Handout provided.   Problem # 2:  UNSPECIFIED VITAMIN D DEFICIENCY (ICD-268.9) Assessment: Unchanged check D level is taking otc daily  Orders: Venipuncture [id number]) TLB-Lipid Panel (80061-LIPID) TLB-Renal Function Panel (80069-RENAL) TLB-CBC Platelet - w/Differential (85025-CBCD) TLB-Hepatic/Liver Function Pnl (80076-HEPATIC) TLB-TSH (Thyroid Stimulating Hormone) (84443-TSH) T-Vitamin D (25-Hydroxy) [id number]) Specimen Handling (99000)  Problem # 3:  OSTEOPENIA (ICD-733.90) Assessment: Unchanged is due for dexa - but has many issues going on now enc ca and D disc further at 3 mo f/u Her updated medication list for this problem includes:    Caltrate 600 1500 Mg Tabs (Calcium carbonate) .Marland Kitchen..Marland Kitchen Two tablets by mouth daily  Problem # 4:  HYPERTENSION (ICD-401.9) Assessment:  Unchanged  this is good on new losartan / hct 100-25 with no cough (after getting ace cough)  refilled med f/u 3 mo  lab today enc good diet and exercise  The following medications were removed from the medication list:    Altace 2.5 Mg Caps (Ramipril) .Marland Kitchen... 1 by mouth once daily Her updated medication list for this problem includes:    Losartan Potassium-hctz 100-25 Mg Tabs (Losartan potassium-hctz) .Marland Kitchen... 1 by mouth once daily  Orders: Venipuncture [id number]) TLB-Lipid Panel (80061-LIPID) TLB-Renal Function Panel (80069-RENAL) TLB-CBC Platelet - w/Differential (85025-CBCD) TLB-Hepatic/Liver Function Pnl (80076-HEPATIC) TLB-TSH (Thyroid Stimulating Hormone) (84443-TSH) T-Vitamin D (25-Hydroxy) [id number])  BP today: 128/74 Prior BP: 158/94 (07/03/2009)  Labs Reviewed: K+: 4.3 (01/28/2009) Creat: : 0.7 (01/28/2009)   Chol: 249 (01/28/2009)   HDL: 42.70 (01/28/2009)   LDL: DEL (01/26/2008)   TG: 170.0 (01/28/2009)  Problem # 5:  ANEMIA OF CHRONIC DISEASE (ICD-285.29) this is stable with hb 11.5 range heme signed off- we will wtch for now presumed anemia of chronic diseast  Problem # 6:  Preventive Health Care (ICD-V70.0) Assessment: Comment Only pneumovax today will check with ins about shingles vaccine  will get flu shot in the fall   Problem # 7:  ANXIETY (ICD-300.00) Assessment: New this is new (pt denies depression) with severe situational stress pt declines counseling - though I think it would help because she has no support  will start on buspar-- disc side eff incl worse anx or dep  f/u 3 mo  disc coping tech/ stressors/ symptoms/ tx opt and side eff in detail spent 25 minutes face to face time with pt , over 50% of which was spent on counseling and coordination of care   Her updated medication list for this problem includes:    Buspirone Hcl 15 Mg Tabs (Buspirone hcl) .Marland Kitchen... 1/2 by mouth two times a day  Problem # 8:  DIZZINESS (ICD-780.4) Assessment:  New episodic and vague and not assoc with other symptoms or syncope  nl exam today suspect this is anxiety related if not imp with med -- however, would consider neurol or cardiac w/u adv to update asap if worse or if not imp with current anx tx  of note-- this preceeded new bp med  Complete Medication List: 1)  Nexium 40 Mg Cpdr (Esomeprazole magnesium) .Marland Kitchen.. 1 by mouth once daily 2)  Aspirin 325 Mg Tabs (Aspirin) .Marland Kitchen.. 1tablet po once daily 3)  Caltrate 600 1500 Mg Tabs (Calcium carbonate) .... Two tablets by mouth daily 4)  Mvi With Iron  .Marland Kitchen.. 1 by mouth qd 5)  Levsin 0.125 Mg Tabs (Hyoscyamine sulfate) .... Take one by mouth as needed 6)  Cipro  .... As needed for uti from her urologist 7)  Buspirone Hcl 15 Mg Tabs (Buspirone hcl) .... 1/2 by mouth two times a day 8)  Losartan Potassium-hctz 100-25 Mg Tabs (Losartan potassium-hctz) .Marland Kitchen.. 1 by mouth once daily  Other Orders: Pneumococcal Vaccine [id number]) Admin 1st Vaccine [phone]) Admin 1st Vaccine [facility]) [phone])  Lipid Assessment/Plan:      Based on NCEP/ATP III, the patient's risk factor category is "history of coronary disease, peripheral vascular disease, cerebrovascular disease, or aortic aneurysm".  The patient's lipid goals are as follows: Total cholesterol goal is 200; LDL cholesterol goal is 100; HDL cholesterol goal is 40; Triglyceride goal is 150.    Patient Instructions: 1)  start buspar two times a day for anxiety 2)  if worse or side effects- stop it and call  3)  start losartan hct 100-25 one daily for blood pressure 4)  labs today  5)  pneumonia vaccine today  6)  check with your insurance about shingles vaccine 7)  follow up in 3 months for blood pressure and to see how you are doing with anxiety   Prescriptions: LOSARTAN POTASSIUM-HCTZ 100-25 MG TABS (LOSARTAN POTASSIUM-HCTZ) 1 by mouth once daily  #30 x 11   Entered and Authorized by:   Allena Earing MD   Signed by:   Allena Earing MD on 04/16/2010    Method used:   Electron[facility] to        [facility]  [address] [id number] (retail)       [address], [place]  [address]       Ph: [phone]       Fax: [phone]   RxID:   [id number] BUSPIRONE HCL 15 MG TABS (BUSPIRONE HCL) 1/2 by mouth two times a day  #30 x 11   Entered and Authorized by:   Allena Earing MD   Signed by:   Allena Earing MD on 04/16/2010   Method used:   [facility] to        [facility]  [address] [id number] ([facility]ail[facility]       [address]       [place], [place]  [address]       Ph: [phone]       Fax: [phone]   RxID:   [id number] NEXIUM 40 MG CPDR (ESOMEPRAZOLE MAGNESIUM) 1 by mouth once daily  #30 x 11   Entered and Authorized by:   Allena Earing MD   Signed by:   Allena Earing MD on 04/16/2010   Method used:   [facility]        [facility]  [address] [id number]       [address]       [place], [place]  [address]       Ph: [phone]       Fax: [phone]   RxID:   [id number]   Current Allergies (reviewed today): ! SULFA ! * TOPROL ! DAYPRO ! AUGMENTIN ! LYRICA ! ACE INHIBITORS     Pneumovax Vaccine    Vaccine Type: Pneumovax    Site: left deltoid    Mfr: Merck    Dose: 0.5 ml    Route: IM    Given by: Ozzie Hoyle LPN    Exp. Date: 10/29/2011  Lot #: [id number]    VIS given: 03/21/96 version given April 16, 2010.

## 2010-09-23 NOTE — Miscellaneous (Signed)
Summary: Vitamin D 50000 iu rx and med list updated  Medications Added VITAMIN D (ERGOCALCIFEROL) 50000 UNIT CAPS (ERGOCALCIFEROL) one capsule by mouth every week for 10 weeks.       Clinical Lists Changes  Medications: Added new medication of VITAMIN D (ERGOCALCIFEROL) 50000 UNIT CAPS (ERGOCALCIFEROL) one capsule by mouth every week for 10 weeks. - Signed Rx of VITAMIN D (ERGOCALCIFEROL) 50000 UNIT CAPS (ERGOCALCIFEROL) one capsule by mouth every week for 10 weeks.;  #10 x 0;  Signed;  Entered by: Ozzie Hoyle LPN;  Authorized by: Allena Earing MD;  Method used: [facility]  [address] [id number] [address] [place], [place], [place]  [address], Ph: [phone], Fax: [phone]    Prescriptions: VITAMIN D (ERGOCALCIFEROL) 50000 UNIT CAPS (ERGOCALCIFEROL) one capsule by mouth every week for 10 weeks.  #10 x 0   Entered by:   Ozzie Hoyle LPN   Authorized by:   Allena Earing MD   Signed by:   Ozzie Hoyle LPN on [id number]   Method used:   [facility]        [facility]  [address] [id number]       [address]       [place], [place]  [address]       Ph: [phone]       Fax: [phone]   [place]:   [phone]  lab appointment scheduled as instructed 07/08/10 at [place]  August 25, 624THL 11:49 AM  Current Allergies: ! SULFA ! * TOPROL ! DAYPRO ! AUGMENTIN ! LYRICA ! ACE INHIBITORS

## 2010-09-23 NOTE — Letter (Signed)
Summary: [facility]   Imported By: Edmonia James 01/02/2010 14:01:17  _____________________________________________________________________  External Attachment:    Type:   Image     Comment:   External Document

## 2010-09-25 NOTE — Letter (Signed)
Summary: Results Follow up Letter  [place] [facility]  [address] [place], [place] [address]   Phone: [phone]  Fax: [phone]    09/16/2010 MRN: [id number]  MALLAK RAPIER [name], [place]  [id number]  Dear Ms. Gerlean Ren,  The following are the results of your recent test(s):  Test         Result    Pap Smear:        Normal _____  Not Normal _____ Comments: ______________________________________________________ Cholesterol: LDL(Bad cholesterol):         Your goal is less than:         HDL (Good cholesterol):       Your goal is more than: Comments:  ______________________________________________________ Mammogram:        Normal __X___  Not Normal _____ Comments: The additional views showed simple cysts. (Good news!) It is recommended that you have a repeat study in 6 months.  ___________________________________________________________________ Hemoccult:        Normal _____  Not normal _______ Comments:    _____________________________________________________________________ Other Tests:    We routinely do not discuss normal results over the telephone.  If you desire a copy of the results, or you have any questions about this information we can discuss them at your next office visit.   Sincerely,      Sheldon Silvan for  Dr. [name]

## 2010-10-08 ENCOUNTER — Encounter: Payer: Self-pay | Admitting: Family Medicine

## 2010-10-21 NOTE — Letter (Signed)
Summary: DMV Handicapped Placard  DMV Handicapped Placard   Imported By: Jamelle Haring 10/14/2010 09:19:44  _____________________________________________________________________  External Attachment:    Type:   Image     Comment:   External Document

## 2010-11-25 LAB — CBC
HCT: 22.9 % — ABNORMAL LOW (ref 36.0–46.0)
HCT: 26.6 % — ABNORMAL LOW (ref 36.0–46.0)
HCT: 28.8 % — ABNORMAL LOW (ref 36.0–46.0)
Hemoglobin: 11.3 g/dL — ABNORMAL LOW (ref 12.0–15.0)
Hemoglobin: 7.7 g/dL — ABNORMAL LOW (ref 12.0–15.0)
Hemoglobin: 8.8 g/dL — ABNORMAL LOW (ref 12.0–15.0)
Hemoglobin: 9.9 g/dL — ABNORMAL LOW (ref 12.0–15.0)
MCHC: 33.2 g/dL (ref 30.0–36.0)
MCHC: 33.2 g/dL (ref 30.0–36.0)
MCHC: 33.5 g/dL (ref 30.0–36.0)
MCHC: 33.6 g/dL (ref 30.0–36.0)
MCV: 87.2 fL (ref 78.0–100.0)
MCV: 88.1 fL (ref 78.0–100.0)
MCV: 88.8 fL (ref 78.0–100.0)
Platelets: 181 10*3/uL (ref 150–400)
Platelets: 188 10*3/uL (ref 150–400)
Platelets: 231 10*3/uL (ref 150–400)
RBC: 2.63 MIL/uL — ABNORMAL LOW (ref 3.87–5.11)
RBC: 3.02 MIL/uL — ABNORMAL LOW (ref 3.87–5.11)
RBC: 3.24 MIL/uL — ABNORMAL LOW (ref 3.87–5.11)
RDW: 13.2 % (ref 11.5–15.5)
RDW: 13.2 % (ref 11.5–15.5)
RDW: 13.3 % (ref 11.5–15.5)
RDW: 13.4 % (ref 11.5–15.5)
WBC: 9 10*3/uL (ref 4.0–10.5)
WBC: 9.3 10*3/uL (ref 4.0–10.5)
WBC: 9.5 10*3/uL (ref 4.0–10.5)

## 2010-11-25 LAB — BASIC METABOLIC PANEL
CO2: 25 mEq/L (ref 19–32)
Calcium: 9.1 mg/dL (ref 8.4–10.5)
Creatinine, Ser: 0.66 mg/dL (ref 0.4–1.2)
Glucose, Bld: 100 mg/dL — ABNORMAL HIGH (ref 70–99)

## 2010-11-25 LAB — PROTIME-INR
INR: 1 (ref 0.00–1.49)
INR: 1.69 — ABNORMAL HIGH (ref 0.00–1.49)
INR: 1.93 — ABNORMAL HIGH (ref 0.00–1.49)
Prothrombin Time: 13.1 s (ref 11.6–15.2)
Prothrombin Time: 13.9 seconds (ref 11.6–15.2)
Prothrombin Time: 19.7 s — ABNORMAL HIGH (ref 11.6–15.2)
Prothrombin Time: 21.9 s — ABNORMAL HIGH (ref 11.6–15.2)

## 2010-11-25 LAB — TYPE AND SCREEN
ABO/RH(D): A NEG
Antibody Screen: POSITIVE

## 2010-11-25 LAB — HEMOGLOBIN AND HEMATOCRIT, BLOOD: HCT: 26.8 % — ABNORMAL LOW (ref 36.0–46.0)

## 2011-01-06 NOTE — Op Note (Signed)
[name] [name], [name] [name]               ACCOUNT NO.:  [id number]   MEDICAL RECORD NO.:  [id number]          PATIENT TYPE:  INP   LOCATION:  0006                         FACILITY:  [facility]   PHYSICIAN:  Tarri Glenn, M.D.  DATE OF BIRTH:  1940-09-30   DATE OF PROCEDURE:  03/15/2008  DATE OF DISCHARGE:                               OPERATIVE REPORT   PREOP DIAGNOSIS:  Osteoarthritis, right knee.   POSTOP DIAGNOSIS:  Osteoarthritis, right knee.   OPERATION:  Osteonics total knee replacement, right.   SURGEON:  Tarri Glenn, MD.   ASSISTANT:  Margy Clarks, PA-C.   ANESTHESIA:  General.   PATHOLOGY AND JUSTIFICATION FOR PROCEDURE:  She actually has almost  symmetrical deformities in both knees with bone-on-bone abutment  medially and significant pain which she has coped with for a number of  years and is now refractory to nonsurgical treatment.   PROCEDURE:  Prophylactic antibiotics.  Satisfactory general anesthesia.  Foley catheter inserted.  Pneumatic tourniquet, right hip stabilizer and  Sure-Foot to right lower extremity.  Right leg was prepped with DuraPrep  from tourniquet to ankle, draped in a sterile field.  Ioban employed.  Time-out performed.  The leg was esmarched out sterilely and I made a  vertical midline incision with median parapatellar incision to open the  joint.  Osteophytes from around the femur and patella were removed.  Patella sized to 26.  I freed up the patellar mechanism enough the  patella could be everted and the knee flexed.  The pes anserinus and  medial collateral ligament were undermined off the proximal tibia,  working backward, because of a varus deformity, to the posterior tibia.  I made a 5/16ths inch drill hole on the distal femur followed by canal  finder and the axis aligner for a 5 degree valgus cut for her right  knee.  She had a slight flexion contracture.  Accordingly, I took 12 mm  from the usual tendon off her distal femur with the  incisor #7 and using  the sizing jig and also placed scribe lines on the cut surface of femur  to apply the distal femoral cutting jig and then made the anterior and  posterior cuts and posterior and anterior chamferings.  I went to the  tibia next and made a leveling cut, incised her tibia to #7 as well.  Using the baseplate, I made my initial intramedullary drill hole,  followed by step-cut drill, canal finder and then the intermedullary rod  for a 9 degree cut off of the proximal tibia.  I initially set it for 4  mm off what was actually a more depressed central portion with no  remaining articular cartilage of the lateral tibial plateau.  I then  placed a lamina spreader and removed remnants of bone and soft tissue  from the posterior condyles and then placed the guide for creating not  only the patellar groove but the notchplasty.  These two items were  completed and we then went through a trial reduction and I felt that  there was just simply not enough room in the knee  and I went back and  took an additional 2 mm of proximal tibia off.  We then went through  another trial reduction and found that this seemed to be an excellent  amount removed, with the knee going into full extension.  I then went  ahead and used the patellar 10 mm recess cutting jig, making the 10 mm  recess cut.  I then placed the guide for creating the 3 fixation drill  holes which were made.  A trial patella was then placed and excess bone  removed from around the patellar component.  I then returned to the  tibia and previously we checked for the 10 mm spacer.  I used the  external rod, splitting the bimalleolar distance and placed scribe lines  on the anterior tibia.  I then placed the tibial baseplate using new  scribe lines, fixing the 3 screws with 3 pins and we then used the  tripod apparatus for reaming for the tibial keel.  Because of a varus  deformity, I elected to use an 80 mm tibial extension and we  reamed up  to a 14.  I then went through a trial reduction with the tibial  component with an 80 mm x 14 mm extension and it fit nicely on the  tibia.  Accordingly, we went ahead and Water-Piked the knee while the  components were being opened and the final stem extension placed.  Methylmethacrylate was then mixed and when we had appropriate  consistency went ahead and placed on the undersurface of the tibial tray  but not on the tibial extension and on the posterior condyles of the  femoral component.  I then individually impacted the components,  starting first with the tibia trimming up excess methylmethacrylate and  then proceeding to the femoral component and finally the patellar,  holding it in with patellar holding clamp.  We then irrigated the wound  well and went through another trial reduction with a 10 mm spacer and  placed the final 10 mm posterior stabilized [place] component.  Knee had  full extension, excellent flexion and good medial and lateral stability.  A lateral release was required for the patella, Hemovac was then placed  and the wound closed in layers with interrupted #1 Vicryl in 2 layers of  the quadriceps and distally 2 layers in the synovium and capsule.  Subcutaneous tissue was closed with a combination of #1 and #2-0 Vicryl,  staples in the skin.  Betadine, Adaptic, dry sterile dressing were  applied.  Tourniquet was released with 1 hour and 42 minutes of  tourniquet time.  No blood loss.  She tolerated the procedure well and  was taken to the recovery room in satisfactory with no known  complications.           ______________________________  Tarri Glenn, M.D.     JA/MEDQ  D:  03/15/2008  T:  03/15/2008  Job:  [id number]

## 2011-01-06 NOTE — H&P (Signed)
[name], [name]               ACCOUNT NO.:  [id number]   MEDICAL RECORD NO.:  [id number]          PATIENT TYPE:  INP   LOCATION:  NA                           FACILITY:  [facility]   PHYSICIAN:  Tarri Glenn, M.D.  DATE OF BIRTH:  February 19, 1941   DATE OF ADMISSION:  03/15/2008  DATE OF DISCHARGE:                              HISTORY & PHYSICAL   CHIEF COMPLAINT:  Pain in my knees, more so on my right.   PRESENT ILLNESS:  This 70 year old white female seen by [name] for  continuing problems concerning pain in her knees, more so on the right  than the left.  We have attempted to treat her with nonsteroidal  antiinflammatories, Ultram for pain and corticosteroid injections into  her a right knee.  She is extremely active, intelligent and aware of the  complications of surgery and has decided to go ahead with total knee  replacement arthroplasty to the right knee due to the fact that her  arthritis is to the point where conservative care is no longer useful.   X-rays have revealed bone-on-bone deformity in the right knee.  Examination right knee reveals crepitus with range of motion and pain on  weightbearing and ambulation with a slight limp.   PAST MEDICAL HISTORY:  This patient's physician is Dr. [name].   She has no medical allergies, food allergies, latex allergies or metal  allergies.   CURRENT MEDICATIONS:  1. Nexium 40 mg twice a day.  2. Altace 250 mg 1 a day.  3. Mobic 1 a day.  4. trimethoprim 100 mg daily per her urologist for history of UTI.  5. She takes a multivitamin daily.  6. Caltrate daily.  7. Vitamin D once a week.  8. She also takes 81 mg of aspirin.  She will stop the multivitamin,      Caltrate D, aspirin and Mobic prior to surgery.   MEDICAL PROBLEMS:  She is treated for high blood pressure reflux and the  urinary tract infection.  She also has chronic anemia, has had a workup  by hematologist/oncologist.  That would be Dr. [name].  She has also had  hypercholesterolemia but is not taking medication for that.   PAST SURGERIES:  Include:  1. D and C in [place].  2. Breast biopsy (benign) 1980, 1982.  3. Lumbar back surgery 1986, 1987, 1989, 2004 and 2005.  4. She had hemorrhoidectomy done in 1978.  5. Hysterectomy in 1973.  6. Some sort of sinus surgery in the early 80s.  7. Tubal ligation in the 70s.   FAMILY HISTORY:  Positive for lung cancer in the father.  Alzheimer's in  the mother.  Knee replacement in a sister.  One brother with cardiac  bypass surgery.   SOCIAL HISTORY:  The patient is married.  She is an Glass blower/designer,  never any intake of tobacco products.  No alcohol intake.  She will have  family help her with her postoperative course at home.   REVIEW OF SYSTEMS:  CNS:  No seizures or paralysis, numbness, double  vision.  RESPIRATORY:  No productive cough, no hemoptysis, no shortness  of breath.  CARDIOVASCULAR:  No chest pain, no angina, no orthopnea.  GASTROINTESTINAL:  No nausea, vomiting, melena or bloody stool.  GENITOURINARY:  No discharge, dysuria or hematuria but she does have  frequent urinary tract infections and is currently under the care of a  urologist.  Her last urinalysis was negative.   PHYSICAL EXAMINATION:  Alert and cooperative, friendly 70 year old white  female who walks with a limp to the right.  VITAL SIGNS:  Blood pressure 130/72 seated right arm.  Pulse is 68  regular. Respirations 12 unlabored.  HEENT:  Normocephalic, PERRLA.  Wears glasses.  Oropharynx is clear.  CHEST:  Clear to auscultation.  No rhonchi or rales.  HEART:  Regular rate and rhythm.  No murmurs are heard.  ABDOMEN:  Obese, soft, nontender.  Liver, spleen not felt.  GENITALIA/RECTAL/ PELVIC/ BREASTS:  Not done, not pertinent to present  illness.  EXTREMITIES:  Right knee as in present illness above.   ADMITTING DIAGNOSES:  1. End-stage osteoarthritis of the right knee.  2. Hypertension.  3. Chronic  anemia.  4. Chronic urinary tract infections.  5. Hypercholesterolemia.  6. Mild reflux.   PLAN:  The patient undergo right total knee replacement arthroplasty.  She will have a short stay in the hospital and then have home physical  therapy provided by [place].      [name].    ______________________________  Tarri Glenn, M.D.    DLU/MEDQ  D:  03/08/2008  T:  03/08/2008  Job:  [id number]   cc:   Tarri Glenn, M.D.  Fax: [place]. Under[place], P.A.

## 2011-01-09 NOTE — Op Note (Signed)
NAMELIBIA, FYE                         ACCOUNT NO.:  [id number]   MEDICAL RECORD NO.:  [id number]                   PATIENT TYPE:  OBV   LOCATION:  [id number]                                 [facility]ACILITY:  [facility]   PHYSICIAN:  Tarri Glenn, M.D.               DATE OF BIRTH:  [birth year]   DATE OF PROCEDURE:  08/25/2002  DATE OF DISCHARGE:                                 OPERATIVE REPORT   PREOPERATIVE DIAGNOSIS:  Herniated nucleus pulposus, L4-5 right.   POSTOPERATIVE DIAGNOSES:  Herniated nucleus pulposus and nonrecessed  stenosis, L4-5 right.   OPERATION:  Microdiskectomy, L4-5 right with decompression of the L5 nerve  root and extensive lateral recess decompression.   SURGEON:  Tarri Glenn, M.D.   ASSISTANT:  Susa Day, M.D.   ANESTHESIA:  General.   INDICATIONS FOR PROCEDURE:  Presenting with progressive right leg pain, with  an MRI demonstrating some abnormalities on the left side.  She has had two  prior back operations, but the predominate finding was an HNP central and to  the right, where there appeared to be a fragment in the lateral recess at L4-  5.  See operative description for further details of surgery.   PROCEDURE:  After satisfactory general anesthesia, prophylactic antibiotics  and knee-chest position on the Florence frame, the back was prepped with  Duraprep and draped as a sterile field.  I first used three spinal needles  and lateral x-ray to determine tentatively the L4-5 interspace, and then  made my incision at this level, after completing the draping with Ioban.   The two spinous processes in the wound, which we felt were L4 and L5, were  then clamped with Kocher clamps.  A second lateral x-ray taken, confirming  that we were at L4-5 interspace near the more cephalad clamp.  I then  dissected soft tissue off the lamina at L4 and L5; this was a virgin  interspace.  This space was confirmed at this time.  She had a large amount  of shingling  at L4-5 and also a good bit of lateral recess bone.  This  required fairly significant decompression, with a combination of Kerrison  rongeurs, osteotome and curet.   When we had decompressed about as much as we felt we could safely do, we  brought in the microscope and completed the lateral recess decompression and  wide foraminotomy for the L5 nerve root.  The L5 nerve root was identified  and moved medially, and beneath it the disk herniation was found.  There was  a good bit of vascularity over it, which we coagulated with bipolar cautery.  The disk was opened and disk material over the L5 body was removed; this was  consistent with the MRI.  We then were able to enter the interspace,  although somewhat obliquely cephalad, and began removing a large amount of  disk material from it as well; using  a combination of nerve hook, Epstein  curet, straight and angled upright pituitaries.  We kept working until we  felt that we had removed all disk material obtainable, and we spent a good  bit of attention working underneath the nerve root and meninges over the  body of L5, to be sure that there was no fragment that we had left behind.  We did not find a discrete fragment in this area, but a good bit of scar.   At the conclusion of our diskectomy, the L4 foramen and  the L5 foramen were  both widely patent to hockey stick; and, the hockey stick also went smoothly  beneath the L5 nerve root, which was now freely mobile.  We then irrigated  the wound well with sterile saline, and packed the lateral gutter over the  interspace with Gelfoam.  There was no unusual bleeding.  Upon removal of  McCulloch retractors minimal bleeding was then corrected with cautery.  The  patient was given 3 mg of Toradol IV.  The fascia was closed with  interrupted #1 Vicryl, subcutaneous tissue with 2-0 Vicryl and skin with  staples.  Betadine Adaptic dry sterile dressing were applied.  At the time  of this dictation  she was on her way to the recovery room in satisfactory  condition with no known complications.                                               Tarri Glenn, M.D.    JA/MEDQ  D:  08/25/2002  T:  08/25/2002  Job:  [id number]

## 2011-01-09 NOTE — Op Note (Signed)
[name], [name]                         ACCOUNT NO.:  [id number]   MEDICAL RECORD NO.:  [id number]                   PATIENT TYPE:  AMB   LOCATION:  DAY                                  FACILITY:  [facility]   PHYSICIAN:  Tarri Glenn, M.D.               DATE OF BIRTH:  December 18, 1940   DATE OF PROCEDURE:  02/20/2004  DATE OF DISCHARGE:                                 OPERATIVE REPORT   PREOPERATIVE DIAGNOSIS:  Recurrent herniated nucleated pulposus, L4-5 right.   POSTOPERATIVE DIAGNOSIS:  Recurrent herniated nucleated pulposus, L4-5  right.   OPERATION:  Microdiskectomy, L4-5 right with excision of recurrent HNP.   SURGEON:  Laurice Record. Aplington, M.D.   ASSISTANT:  Dr. Tonita Cong   ANESTHESIA:  General.   PATHOLOGY AND JUSTIFICATION FOR PROCEDURE:  She has had multiple back  operations with the last microdiskectomy at L4-5 on the right in August of  last year.  She was doing well until recently went she redeveloped back and  right leg pain with an MRI with gadolinium on [name] 22, 2004, demonstrating  abnormalities at a number of levels but at L4-5 on the right a recurrent  disk fragment.  It was felt that this was the source of her problems with  the other findings being unchanged.  I had discussed her situation with Dr.  Tonita Cong, and he concurred with my feelings that because of her age and  multiple disk abnormalities, that she was not a candidate for a fusion.  All  of this was discussed with her.   PROCEDURE:  Prophylactic antibiotics, satisfactory general anesthesia, knee-  chest position on the Andrews frame, back was prepped with DuraPrep and with  3 spinal needles and a lateral x-ray, I tentatively located the L4-5 disk.  Then went through the old incision at this point and clamped the spinous  processes of L4 and L5 with Kocher clamps, and a lateral x-ray once again  confirmed that we were on L4 and L5 with the disk space midway between.  I  dissected soft tissue off of the  remaining lamina of L4 and L5, placed a  self-retaining McCullough retractor, and then used a combination of curettes  and 2 mm Kerrison rongeur to try and establish the previous bony perimeter  and free up the meninges.  Distally, we were unable to really find this  perimeter because of scar.  Proximally, I was able to remove a portion of  the lamina inferiorly of L4 and work laterally.  Part of the facet joint was  removed.  We worked down to the pedicle.  We were unable to remove any  additional bone but realizing that this was the pedicle and that the  interspace was just cephalad to it, we were able to dissect and find the  interspace.  I entered with a micropituitary and began removing chunks of  disk material which were small but  multiple, and by doing this, we were able  to free up the meninges and the L5 nerve root.  Used a combination of  Epstein curette and nerve hook to remove what appeared to be the fragments  of what I initially thought was a free fragment.  We worked cautiously but  aggressively, working across the midline until Physicist, medical and any  conceivable fibers of material that could be creating the problem was  removed.  At the conclusion of this, her meninges were mobile as was her L5  nerve root.  We were unable to get to the L4-5 foramen, but the L3-4 foramen  was patent.  There were no iatrogenic complications.  The wound was  irrigated with sterile saline.  Gelfoam was placed over the interspace and  over the meninges.  I then removed the self-retaining retractors.  There was  no unusual bleeding.  I continued closure with interrupted #1 Vicryl in the  fascia and deep subcutaneous tissue, 2-0 Vicryl in the superficial  subcutaneous tissue,  and staples on the skin.  I also infiltrated the subcutaneous tissue with  0.5% plain Marcaine.  Betadine, Adaptic dry sterile dressing were applied.  She was gently rolled on her bed and at the time of this dictation,  was on  her way to the recovery room in satisfactory condition with no known  complications.                                               Tarri Glenn, M.D.    JA/MEDQ  D:  02/20/2004  T:  02/20/2004  Job:  [id number]

## 2011-01-09 NOTE — Op Note (Signed)
NAMESHAUNIECE, PIPKINS                         ACCOUNT NO.:  [id number]   MEDICAL RECORD NO.:  [id number]                   PATIENT TYPE:  INP   LOCATION:  [id number]                                 FACILITY:  [facility]   PHYSICIAN:  Odis Hollingshead, M.D.            DATE OF BIRTH:  15-Dec-1940   DATE OF PROCEDURE:  03/02/2004  DATE OF DISCHARGE:                                 OPERATIVE REPORT   PREOPERATIVE DIAGNOSIS:  Biliary pancreatitis.   POSTOPERATIVE DIAGNOSIS:  Biliary pancreatitis.   PROCEDURE:  Laparoscopic cholecystectomy with intraoperative cholangiogram.   SURGEON:  Odis Hollingshead, MD   ASSISTANT:  Georgina Quint, MD   ANESTHESIA:  General.   INDICATION:  This is a 70 year old female admitted 2 days ago with biliary  pancreatitis.  She has gallstones on ultrasound.  Her pancreatitis has  resolved fairly nicely, and now she presents for laparoscopic  cholecystectomy.  The procedure and the risks were discussed with her  postoperatively.   TECHNIQUE:  She was brought to the operating room, placed supine on the  operating table, and a general anesthetic was administered.  The abdominal  wall was sterilely prepped and draped.  Dilute Marcaine solution was  infiltrated in the subumbilical region, and a small subumbilical incision  was made through the skin and subcutaneous tissue until the fascia was  identified.  The incision was made in the midline fascia.  The peritoneum  was grasped and an incision made in the peritoneum, and the peritoneal  cavity was entered under direct vision.  A pursestring suture of 0 Vicryl  was placed around the fascial edges.  A Hasson trocar was introduced into  the peritoneal cavity, and a pneumoperitoneum was created by insufflation of  CO2 gas.   The laparoscope was introduced, and the patient was placed in the reverse  Trendelenburg position with the right side tilted slightly upward.  No  specific liver abnormalities were noted.  An  11 mm trocar was placed through  an epigastric incision and two 5 mm trocars placed in the right mid lateral  abdomen.  The fundus of the gallbladder was grasped and retracted toward the  right shoulder.  It was no acutely inflamed but was somewhat edematous.  The  infundibulum was then mobilized using blunt dissection and select cautery.  The cystic duct was identified, isolated, and a window created around it.  A  clip was placed at the cystic duct gallbladder junction and a small incision  made in the cystic duct.  A cholangiocatheter was passed through the  anterior abdominal wall, placed into the cystic duct, and a cholangiogram  was performed.   Under real-time fluoroscopy, dilute contrast material was injected into the  cystic duct which was of moderate length.  The common hepatic, right and  left hepatic, and common bile duct spilled promptly.  The common bile duct  drained slowly but was able to drain.  I did not see any obvious lucent  filling defects.  The pancreatic duct was also visualized as well.   The cholangiocatheter was removed; the cystic duct was then clipped 3 times  on the staying-in side and divided sharply.  Anterior branch of the cystic  artery was identified, clipped, and divided, and the posterior branch and  clipped and divided.  The gallbladder was then dissected free from the liver  bed intact using electrocautery.  It was placed in an Endopouch bag.   The gallbladder fossa was irrigated and bleeding points controlled with the  cautery.  It was inspected again, and no bile leak or bleeding was noted.  The irrigation fluid in the perihepatic region was evacuated.  The  gallbladder was then removed through the subumbilical port, and then the  subumbilical fascial defect was closed under laparoscopic vision by  tightening up and tying down the pursestring suture.  The remaining trocars  were removed, and a pneumoperitoneum was released.  The skin incisions  were  closed with 4-0 Monocryl subcuticular stitches.  Steri-Strips and sterile  dressings were applied.   She tolerated the procedure well without any apparent complications and was  taken to the recovery room in satisfactory condition.                                               Odis Hollingshead, M.D.    Katina Degree  D:  03/02/2004  T:  03/02/2004  Job:  [id number]   cc:   Tarri Glenn, M.D.  [place]  [place] [address]  Fax: [place]. Glori Bickers, M.D. [facility]

## 2011-01-09 NOTE — Cardiovascular Report (Signed)
[place]  Patient:    LASONJA, SPEHAR                        MRN: [id number] Proc. Date: 03/17/00 Attending:  Allene Dillon, M.D. [facility] CC:         Ishmael Holter. Sarajane Jews, M.D.             Cardiac Cath Lab                        Cardiac Catheterization  PROCEDURE PERFORMED:  Left heart catheterization with coronary angiography and left ventriculography.  INDICATIONS:  The patient is a 70 year old woman with recent onset of chest pain.  An exercise treadmill test was positive for ST segment depression.  She was referred for cardiac catheterization.  PROCEDURAL NOTE:  A 6-French sheath was placed in the right femoral artery. Standard Judkins 6-French catheters were utilized.  Contrast was Omnipaque. There were no complications.  At the conclusion of the case, a Perclose vascular closure device was placed in the right femoral artery with good hemostasis.  There were no complications.  CATHETERIZATION RESULTS:  HEMODYNAMICS:  Left ventricular pressure 178/24, aortic pressure 178/94. There is no aortic valve gradient.  LEFT VENTRICULOGRAM:  Wall motion is normal.  Ejection fraction is estimated at 60%.  No mitral regurgitation.  CORONARY ANGIOGRAPHY (RIGHT DOMINANT):  The left main has an ostial 30% stenosis.  The LAD has an ostial 30% stenosis.  The LAD gives rise to three small to normal size diagonal branches.  Left circumflex has a less than ____ stenosis in the mid vessel.  It gives rise to a small OM1, small OM2, and a large OM3.  Right coronary artery is a large dominant vessel.  It has a 20% stenosis proximally.  It has a normal size posterior descending artery, a small first posterolateral branch, and normal size second and third posterolateral branches.  IMPRESSION: 1. Normal left ventricular systolic function. 2. Nonobstructive coronary artery disease. DD:  03/17/00 TD:  03/18/00 Job: [id number]

## 2011-01-09 NOTE — Assessment & Plan Note (Signed)
[facility]                           GASTROENTEROLOGY OFFICE NOTE   NAME:[name], [name] MONTAGNA                   MRN:          [id number]  DATE:05/04/2006                            DOB:          Aug 29, 1940    [name] [name] is referred through the courtesy of Dr. Glori Bickers for evaluation of  recurrent dysphagia.   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 Henrene Pastor.  At that  time she also had a negative colonoscopy, except for extensive  diverticulosis.  At that time she was iron-deficient and had a rather  extensive GI evaluation, including small bowel biopsy that was normal.   Since that time, she has had continued  acid reflux, since being off of  Nexium for the last year-and-a-half.  She has had recurrent dysphagia and  has been restarted on Nexium by Dr. Glori Bickers.  She has dysphagia in her upper  esophageus with solids, but not liquids.  She is status-post cholecystectomy  and apparently had gall-stone pancreatitis in 2005.  She denies any current  hepatobiliary complaints.  She has alternating diarrhea and constipation  with gas and bloating, and carries a diagnosis of IBS with previous  hemorrhoid surgery.  She relates that she did hemoccult cards in January  that were guaiac negative.  She is unclear as to what her recent hemoglobin  or iron levels were.  She has had a previous hysterectomy and does not  menstruate.  She specifically denies melena, hematochezia, anorexia, or  weight loss.  In fact, she has gained 50 pounds over the last two years.  She denies any specific food intolerances and does not use sorbitol or  fructose.   PAST MEDICAL HISTORY:  Remarkable for rather severe degenerative arthritis,  and she is followed by Dr. Shellia Carwin.  She has also had  hypercholesterolemia,  hypertension, chronic depression, and chronic  sinusitis.  She has had multiple surgical procedures, including six back  surgeries, negative cardiac catheterization, two arthroscopic knee  surgeries, cholecystectomy, hemorrhoid surgery, hysterectomy, tubal  ligation, appendectomy, and breast biopsies.   MEDICATIONS:  1. Altace 2.5 mg a day.  2. Nexium 40 mg a day.  3. Lyrica daily.  4. Multivitamins.  5. Caltrex.  6. Vitamin D twice a day.  7. Ecotrin 325 mg a day with frequent NSAID use.   ALLERGIES:  SHE DENIES DRUG ALLERGIES.   FAMILY HISTORY:  Noncontributory, except for all of her family with  atherosclerosis and multiple members with diabetes.  She has a 8 year old  daughter who has had colon polyps removed.   SOCIAL HISTORY:  She is married and lives with her husband.  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  She  does not smoke, or abuse ethanol.   REVIEW OF SYSTEMS:  Otherwise, noncontributory, except for rather marked  degenerative arthritis.  Since starting Lyrica she has had weight gain and  rather diffuse arthralgias in her wrists.  She does have some shortness of  breath with exertion, but denies angina or other cardiovascular problems.  She does have some mild urinary incontinence and chronic low back pain.  The  review of systems is otherwise noncontributory.   PHYSICAL EXAMINATION:  GENERAL:  She is a healthy-appearing white female in  no acute distress, appearing her stated age.  She is 5'5 tall and weighs  214 pounds.  Blood pressure is 142/80, pulse 90 and regular.  I could not  appreciate stigmata of chronic liver disease or thyromegaly.  CHEST:  Clear.  There were no murmurs, gallops, or rubs.  She was in a  regular rhythm.  ABDOMEN:  Her abdomen showed no definite organomegaly, masses, or  tenderness.  CARDIAC:  Valve sounds were normal.  EXTREMITIES:  Peripheral extremities were unremarkable.  RECTAL:   Deferred.  MENTAL STATUS:  Normal.  HEENT: EOMI. PERRLA. Sclerae are anicteric.  Conjunctivae are pink.  NECK:  Supple without thyromegaly, adenopathy or carotid bruits.   ASSESSMENT:  1. 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  2. Diverticulosis E. coli with rather classic irritable bowel syndrome.  3. Status-post cholecystectomy and history of gall-stone pancreatitis.  4. Exogenous obesity.  5. Degenerative arthritis with frequent NSAID use.  6. Status-post multiple surgical procedures.   RECOMMENDATION:  1. Standard anti-reflux maneuvers and regular Nexium for long-term      maintenance therapy, 40 mg before the first meal of the day.  2. Outpatient endoscopy and dilatation.  3. Information and dietary management of diverticulosis.  4. Continue other medications as per Dr. Glori Bickers.  5. Check follow-up CBC and iron studies.  6. The patient has had previous small bowel biopsies that were normal some      six to seven years ago, but I will check some celiac antibody      serologies.                                   Loralee Pacas. Sharlett Iles, MD, Marval Regal, [place]   DRP/MedQ  DD:  05/04/2006  DT:  05/04/2006  Job #:  [id number]   cc:   Marne A. Glori Bickers, MD  Tarri Glenn, M.D.

## 2011-01-09 NOTE — Discharge Summary (Signed)
[name] [name], [name] [name]                         ACCOUNT NO.:  [id number]   MEDICAL RECORD NO.:  [id number]                   PATIENT TYPE:  INP   LOCATION:  [id number]                                 FACILITY:  [facility]   PHYSICIAN:  Odis Hollingshead, M.D.            DATE OF BIRTH:  02-Feb-1941   DATE OF ADMISSION:  02/29/2004  DATE OF DISCHARGE:  03/04/2004                                 DISCHARGE SUMMARY   PRINCIPAL DISCHARGE DIAGNOSIS:  Biliary pancreatitis.   SECONDARY DIAGNOSES:  1. Hypertension.  2. Chronic anemia.  3. Lumbar spine degenerative joint disease.  4. Peptic ulcer disease.  5. Psoriasis.   PROCEDURE:  Laparoscopic cholecystectomy with intraoperative cholangiogram  on March 02, 2004.   REASON FOR ADMISSION:  [name] is a 70 year old female with acute onset  of pressure-type epigastric pain that progressively worsened and radiated to  her back.  She is recently postoperative from a L4-5 microdiskectomy by Dr.  Shellia Carwin.  She subsequently was seen in the emergency department diagnosed  with biliary pancreatitis and admitted.   HOSPITAL COURSE:  She was placed on bowel rest.  Liver functions were  elevated, amylase and lipase, and these slowly came down.  By March 02, 2004,  she is asymptomatic and she is taken to the operating room where she  underwent a laparoscopic cholecystectomy with intraoperative cholangiogram  which she tolerated well.  Postoperatively, she had an uneventful recovery.  By her second postoperative day, she was improving, hypertension was stable,  and she was able to be discharged.   DISPOSITION:  Discharged to home on March 04, 2004.   She was given discharge instructions, and activity was as per Dr.  Pearla Dubonnet previous instructions.   DISCHARGE MEDICATIONS:  1. She was told to continue her home medications.  2. Tylox for pain.   FOLLOWUP:  She will come back and see me in about two to three weeks.             Odis Hollingshead, M.D.    Katina Degree  D:  04/11/2004  T:  04/12/2004  Job:  [id number]   cc:   Marne A. Glori Bickers, M.D. [facility]   Tarri Glenn, M.D.  [address]  [place]  [place] [address]  Fax: [phone]

## 2011-01-09 NOTE — H&P (Signed)
NAMEMERAV, BURCHILL                         ACCOUNT NO.:  [id number]   MEDICAL RECORD NO.:  [id number]                   PATIENT TYPE:  INP   LOCATION:  [id number]                                 FACILITY:  [facility]   PHYSICIAN:  Odis Hollingshead, M.D.            DATE OF BIRTH:  September 13, 1940   DATE OF ADMISSION:  02/29/2004  DATE OF DISCHARGE:                                HISTORY & PHYSICAL   REASON FOR ADMISSION:  Epigastric pain.   HISTORY OF PRESENT ILLNESS:  This 70 year old female awoke this morning  early with some pressure-type epigastric pain that persisted and got worse.  It radiated through to her back and around her sides.  It was associated  with some nausea and some sweats.  She could not get any relief.  She did  not have any diarrhea from it.  Of note, she is about postop day #10 from an  L4-L5 microdiscectomy.  She has not had any pain like this before.  Does not  have any known gallbladder disease.   PAST MEDICAL HISTORY:  1. Chronic anemia.  2. 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  3. Hypertension.  4. Peptic ulcer disease.  5. Psoriasis.  6. Diverticulosis.   PREVIOUS OPERATIONS:  1. Total abdominal hysterectomy.  2. Hemorrhoidectomy.  3. Lumbar spine surgery x6.  4. Bilateral knee arthroscopy.  5. Breast biopsy x2.  6. D&C x2.  7. Sinus surgery.   ALLERGIES:  SULFA DRUGS.   MEDICATIONS:  1. Altace.  2. Nexium.  3. Aspirin.  4. Calcium with D.  5. Vitamins.  6. Percocet.  7. Robaxin.   SOCIAL HISTORY:  No tobacco use.  Occasional alcohol use.   FAMILY HISTORY:  Noncontributory at this time.   REVIEW OF SYSTEMS:  CARDIAC:  She denies any known heart attack.  She did  have a catheterization in the distant past, which showed maybe a 30%  blockage of one artery, but good function per her report.  No dysrhythmia.  PULMONARY:  No pneumonia, asthma, COPD.  GI:  No hepatitis.  GU:  No  difficulty with urination.   NEUROLOGIC:  No strokes or seizures.  ENDOCRINE:  Denies thyroid disease.  Denies diabetes.  HEMATOLOGIC:  She states she had  a blood clot in her right eye in the past, but no DVT.  No known bleeding  disorders.   PHYSICAL EXAMINATION:  GENERAL:  Well-developed, well-nourished female in no  acute distress.  Pleasant and cooperative.  VITAL SIGNS:  Temperature is 97.6, blood pressure 137/71, pulse 76.  HEENT:  Eyes - extraocular motions intact.  No icterus.  NECK:  Supple without palpable masses or thyroid enlargement.  RESPIRATORY:  Breath sounds equal and clear.  Respirations unlabored.  CARDIOVASCULAR:  Regular rate and rhythm.  No murmur.  No lower extremity  edema.  Good pedal pulses.  ABDOMEN:  Soft with epigastric tenderness and guarding.  No obvious masses  present.  Lower midline scar noted.  She has a diffuse rash on her abdominal  wall, consistent with her psoriasis.  BACK:  Healing lower back incision with also a diffuse rash consistent with  psoriasis.  MUSCULOSKELETAL:  She has scaly rashes the elbows and knees.  Good muscle  tone.  NEUROLOGIC:  She is alert and oriented, and answers questions appropriately.   LABORATORY DATA:  Hemoglobin 10.9, white blood cell count 9800, electrolytes  normal except for a sodium of 134, and a glucose of 117.  SGOT elevated at  307.  SGPT of 147.  Total bilirubin and alkaline phosphatase were normal.  Amylase significantly elevated at 1337.  Lipase pending.   Abdominal ultrasound demonstrates some gallstones with normal gallbladder  wall; some pericholecystic fluid present.   IMPRESSION:  1. Acute biliary pancreatitis plus acute cholecystitis.  2. Hypertension.  3. Chronic anemia.  4. Status post recent L4-L5 microdiscectomy.   PLAN:  Admission to the hospital, bowel rest, IV fluids, and empiric  antibiotics.  Will recheck her laboratory work in the morning.  Have  informed Dr. Pearla Dubonnet partner that she is here in the  hospital.  Hopefully we can do a laparoscopic cholecystectomy for her in the near  future.  As for the laparoscopic cholecystectomy, I did go over the  procedure and the risks with her.  The risks include, but are not limited  to, bleeding, infection, common bile duct injury, hepatic injury, small  intestinal injury, post-cholecystectomy diarrhea, and the risk of  anesthesia.  Both she and her family seem to understand the plan and are  agreeable to it.                                               Odis Hollingshead, M.D.    Katina Degree  D:  02/29/2004  T:  03/01/2004  Job:  [id number]   cc:   Marne A. Glori Bickers, M.D. [facility]   Tarri Glenn, M.D.  [address]  [place]  [place] [address]  Fax: [phone]

## 2011-01-09 NOTE — Op Note (Signed)
NAMEWILLINE, [name] [name]                         ACCOUNT NO.:  [id number]   MEDICAL RECORD NO.:  [id number]                   PATIENT TYPE:  AMB   LOCATION:  DAY                                  FACILITY:  [facility]   PHYSICIAN:  Tarri Glenn, M.D.               DATE OF BIRTH:  04-06-1941   DATE OF PROCEDURE:  04/05/2003  DATE OF DISCHARGE:                                 OPERATIVE REPORT   PREOPERATIVE DIAGNOSIS:  Recurrent herniated nucleated pulposis, L4-5 right.   POSTOPERATIVE DIAGNOSIS:  Recurrent herniated nucleated pulposis, L4-5  right.   OPERATION:  Microdiskectomy, L4-5 right with the excision of recurrent HNP.   SURGEON:  Laurice Record. [place], M.D.   ASSISTANT:  Kipp Brood. Gladstone Lighter, M.D.   ANESTHESIA:  General.   PATHOLOGY AND JUSTIFICATION FOR PROCEDURE:  Prior surgery was on August 25, 2002, and she had done well until recently with progressive right leg pain  and paresthesias.  An MRI with gadolinium on March 14, 2003, demonstrates  recurrent medial foraminal disk extrusion on the right at L4-5 with caudal  extension of the fragment into the right lateral recess.  Because of the  severity of symptoms, she is here today for the above-mentioned surgery.   PROCEDURE:  Prophylactic antibiotics, satisfactory general anesthesia, knee-  chest position on the [place] frame.  Back was prepped with DuraPrep, draped  in a sterile field, Ioban employed.  The old surgical scar was excised.  Incision was carried down to what I felt was the spinous processes of L4 and  L5, and these were tagged with Kocher clamps and lateral x-ray taken,  confirming that they were on L4 and L5 with the disk space just caudal to  the L4 clamp.  We then began carefully dissecting tissue off the residual  laminotomy site with a combination of curettes and rongeur and when we had  the bony perimeter outlined, began with a 2 mm Kerrison rongeur which was  used primarily but occasionally a 3 mm to remove  bone around the perimeter  until we were able to work around the previous postoperative scar.  The L5  nerve root was freed up in the foramen.  The L5 nerve root was freed up in  the foramen.  She had a large amount of scar present laterally, and I gently  dissected through this with a combination of Penfield 4 and curette.  What  we felt was the interspace was then located, and I placed a small pituitary  in it and took a confirmatory lateral x-ray.  In the meantime, I used  micropituitary and regular pituitary rongeurs as well as Epstein curette and  nerve hook to begin removing disk material from the interspace.  We removed  several large fragments as well as multiple portions of smaller disk  material, working centrally and laterally with both of [name] removing disk  material.  We dissected  around the previous scar which was quiet abundant,  making sure that all disk material obtainable was removed.  At the  conclusion of the case, the interspace seemed to be free of any obtainable  disk material; the L5 nerve root and the meninges were freely movable.  Bipolar cautery was used throughout the case.  At the conclusion, the wound  was dry.  It was well-irrigated, and then Gelfoam was placed over the  interspace, around the dura, and L5 nerve root.  Self-retaining McCullough  retractors were removed.  There was no unusual bleeding.  We closed the  wound with interrupted #1 Vicryl in the fascia and deep subcutaneous tissue,  and the subcutaneous tissue was then infiltrated with 0.5% plain Marcaine.  Closure was continued with interrupted 2-0  Vicryl in the superficial subcutaneous tissue and staples in the skin.  Betadine, Adaptic dry sterile dressing were applied.  She tolerated the  procedure well and was moved to her PACU bed and taken to the recovery room  in satisfactory condition with no known complications.                                               Tarri Glenn, M.D.     JA/MEDQ  D:  04/05/2003  T:  04/05/2003  Job:  [id number]

## 2011-01-09 NOTE — Assessment & Plan Note (Signed)
[facility]                           GASTROENTEROLOGY OFFICE NOTE   NAME:[name], [name] SQUYRES                   MRN:          [id number]  DATE:06/15/2006                            DOB:          05-Nov-1940    [name] [name] returns for followup of her endoscopy that was done on May 12, 2006.  She had a ring-like esophageus but biopsy showed no evidence of  eosinophilic esophagitis.  She was dilated with a #58 [place] Maloney dilator  and no longer has dysphagia, but does describe periodic globus sensation in  her throat without other GI symptoms at this time.  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  As mentioned above, esophageal biopsy  showed no evidence of increased eosinophils or Barrett's mucosa.   VITAL SIGNS:  All normal with blood pressure 148/90.  NECK:  Examination of the neck showed no thyromegaly or lymphadenopathy, or  other masses.   ASSESSMENT:  I certainly think that [name] has acid reflux disease and  probably has a globus sensation associated with acid reflux.  However, this  is a very minor problem for her at this time, and actually occurs usually  after yawning.   RECOMMENDATIONS:  1. Reflux regime with her and we will try Nexium 40 mg twice a day for a      couple of months to see how she does symptomatically.  2. She has mild iron sufficiency.  I have placed her on Tandem capsules      once a day.  We will check followup CBC when she returns for office      followup.  3. On review of her chart, she last had a colonoscopy in 2001, which was      normal, except for diverticulosis.  She probably needs to do repeat      Hemoccult cards and probably needs repeat followup colonoscopy and      return to clinic.   ADDENDUM:  Recent lab done on September 11 showed a hemoglobin of 11.1 with  16.6% iron saturation.  Her ferritin level was  normal at 104 and B12 and  folate levels were normal.       Loralee Pacas. Sharlett Iles, MD, Quentin Ore, FAGA      DRP/MedQ  DD:  06/15/2006  DT:  06/15/2006  Job #:  [id number]   cc:   Marne A. Glori Bickers, MD

## 2011-01-09 NOTE — Discharge Summary (Signed)
[name] [name], [name] [name]               ACCOUNT NO.:  [id number]   MEDICAL RECORD NO.:  [id number]          PATIENT TYPE:  INP   LOCATION:  [id number]                         FACILITY:  [facility]   PHYSICIAN:  Tarri Glenn, M.D.  DATE OF BIRTH:  01-Dec-1940   DATE OF ADMISSION:  03/15/2008  DATE OF DISCHARGE:  03/20/2008                               DISCHARGE SUMMARY   ADMITTING DIAGNOSES:  1. End-stage osteoarthritis of the right knee.  2. Hypertension.  3. Chronic anemia.  4. Chronic urinary tract infection.  5. Hypercholesterolemia.  6. Mild reflux.   DISCHARGE DIAGNOSIS:  1. End-stage osteoarthritis of the right knee.  2. Hypertension.  3. Chronic anemia.  4. Chronic urinary tract infection.  5. Hypercholesterolemia.  6. Mild reflux.  7. Acute postoperative anemia treated with transfusion.   OPERATIONS:  On March 15, 2008 the patient underwent [employer] total knee  replacement arthroplasty to the right knee.  Verner Chol, PA-C  assistant.   CONSULTS:  None.   BRIEF HISTORY:  This 70 year old lady had continuing problems concerning  pain in her knees.  She had more pain the right knee than the left knee.  Nostril anti-inflammatories as well as Ultram for pain and  corticosteroid injections the right knee did not really provide her with  long-term relief.  She is quite frustrated with her current condition  and she is aware of the complications of surgery and decided to go ahead  with right total knee replacement arthroplasty.  X-ray showed bone-on-  bone deformity of the right knee.   COURSE IN THE HOSPITAL:  The patient tolerated the surgical procedure  quite well.  She was very slow early on with her level of activity with  physical therapy.  PCA was used postoperatively for her knee, this was  discontinued on the second postop day.  Dressing was changed showing  that the wound was healing well.  No evidence of any gross drainage.  The patient began ambulating with physical  therapy on the total knee  protocol.  She was able to negotiate 80 feet early on but began having  lightheadedness and weakness.  She was transfused with 2 units of packed  cells which allowed her to have more of an increased level of activity  without lightheadedness and dizziness.  At the time of transfusion her  hemoglobin was 7.5, hematocrit was 22.1; after transfusion hemoglobin  was 9.9 with hematocrit of 29.2.  She was eventually able to walk in the  hall using mild walker and weightbearing as tolerated at least 130 feet.  Home Care was arranged through [facility] and once this is in  place it was felt that she could be maintained in her home environment.  Dr. Gladstone Lighter saw her on the day of discharge and felt that she was safe  to go home and was stable.  The INR was 1.9 at that time.  She was  discharged home with Home Health.   LABORATORY VALUES:  In the hospital hematologically showed a  preoperative hemoglobin which did show the chronic anemia, RBC was 3.78,  hemoglobin  was 10.8, hematocrit was 32.1.  Final blood counts showed the  RBC 3.42, hemoglobin 9.9, hematocrit 29.2.  Blood chemistries remained  normal throughout her hospitalization other than a slightly elevated  glucose.  Electrocardiogram showed normal sinus rhythm with left atrial  enlargement, nonspecific T-wave abnormality.  Chest X-Ray showed no  active lung disease.   CONDITION ON DISCHARGE:  Improved, stable.   PLAN:  The patient discharged to her home.  Continue with weightbearing  as tolerated on the operative extremity and continue with her home  medications.  She was given Percocet for pain, Robaxin as a muscle  relaxant.  Continue with Coumadin as a prophylaxis against DVT for 4  weeks after date of surgery.  She will stop taking her meloxicam,  acetaminophen #3 and Ecotrin.      [name].    ______________________________  Tarri Glenn, M.D.    DLU/MEDQ  D:   04/04/2008  T:  04/04/2008  Job:  [id number]

## 2011-01-19 ENCOUNTER — Encounter: Payer: Self-pay | Admitting: Family Medicine

## 2011-01-27 ENCOUNTER — Ambulatory Visit (INDEPENDENT_AMBULATORY_CARE_PROVIDER_SITE_OTHER): Payer: Medicare Other | Admitting: Family Medicine

## 2011-01-27 ENCOUNTER — Encounter: Payer: Self-pay | Admitting: Family Medicine

## 2011-01-27 DIAGNOSIS — E7800 Pure hypercholesterolemia, unspecified: Secondary | ICD-10-CM

## 2011-01-27 DIAGNOSIS — E559 Vitamin D deficiency, unspecified: Secondary | ICD-10-CM

## 2011-01-27 DIAGNOSIS — M1990 Unspecified osteoarthritis, unspecified site: Secondary | ICD-10-CM

## 2011-01-27 DIAGNOSIS — I10 Essential (primary) hypertension: Secondary | ICD-10-CM

## 2011-01-27 NOTE — Assessment & Plan Note (Signed)
Pt asked to add another 1000 iu vit D to her regimen- last D wa s22 Re check aug

## 2011-01-27 NOTE — Assessment & Plan Note (Signed)
Pt continues to have hip/back pain - with upcoming mri  One hip repl in past  celebrex not working after 2 months - so I adv her to stop it in light of risk factors

## 2011-01-27 NOTE — Progress Notes (Signed)
Subjective:    Patient ID: [name] [name], female    DOB: 1941-04-29, 70 y.o.   MRN: [id number]  HPI Here for 6 mon f/u of hyperlipidemia and HTN and anemia and vit D def  Overall is about the same  Stressed to the max - not a lot of change with that -- does not know what to expect   HTN is in good control with 128/76 No cp or ha or edema  Lab Results  Component Value Date   CHOL 186 06/04/2010   CHOL 299* 04/16/2010   CHOL 249* 01/28/2009   Lab Results  Component Value Date   HDL 43.70 06/04/2010   HDL 49.50 04/16/2010   HDL 42.70 01/28/2009   Lab Results  Component Value Date   LDLCALC 106* 06/04/2010   Lab Results  Component Value Date   TRIG 184.0* 06/04/2010   TRIG 244.0* 04/16/2010   TRIG 170.0* 01/28/2009   Lab Results  Component Value Date   CHOLHDL 4 06/04/2010   CHOLHDL 6 04/16/2010   CHOLHDL 6 01/28/2009   Lab Results  Component Value Date   LDLDIRECT 199.5 04/16/2010   LDLDIRECT 169.2 01/28/2009   LDLDIRECT 151.1 01/26/2008   She does watch her diet  Is taking lipitor - has labs scheduled before august    Wt stable with bmi of 33 No time to take care of herself or work on wt loss - not really concerned about it with present stressors   Anemic 11.7- last check chronic No symptoms - this is pretty good for her    Vit D last 22  --takes her calcium with the vitamin D daily  2 a day  No addn vit D at this time- does not remember adv to take it  No broken bones   Is on celebrex for hip - on it for 2 months - not helping hip Helps other aches and pain  This makes it very difficult to get around - is very frustrated   Patient Active Problem List  Diagnoses  . UNSPECIFIED VITAMIN D DEFICIENCY  . HYPERCHOLESTEROLEMIA WITH HIGH HDL  . ANEMIA OF CHRONIC DISEASE  . ANXIETY  . DEPRESSION  . RETINAL VEIN OCCLUSION  . HYPERTENSION  . CAD  . ESOPHAGEAL STRICTURE  . GERD  . PEPTIC ULCER DISEASE  . GALLSTONE PANCREATITIS  . URINARY TRACT INFECTION,  RECURRENT  . OSTEOARTHRITIS  . OSTEOPENIA  . DYSPHAGIA  . MAMMOGRAM, ABNORMAL  . SKIN CANCER, HX OF  . FIBROCYSTIC BREAST DISEASE, HX OF   Past Medical History  Diagnosis Date  . Anemia     of chronic disease  . Hx of skin cancer, basal cell   . Psoriasis   . Depression   . Anxiety   . Hypertension   . Arthritis     osteoarthritis  . Peptic ulcer disease     H pylori  . Fibrocystic breast   . Osteopenia    Past Surgical History  Procedure Date  . Cholecystectomy   . Abdominal hysterectomy     partial in 1970s  . Breast surgery   . Knee surgery     x3  . Nasal sinus surgery   . Esophagogastroduodenoscopy 03/2009    ulcer,HP, GERD stricture  . Spine surgery     back surgery x 3  . Spine surgery 2005    deg. disk lumber spine    History  Substance Use Topics  . Smoking status: Never Smoker   .  Smokeless tobacco: Not on file  . Alcohol Use: Yes     occasional   Family History  Problem Relation Age of Onset  . Arthritis Mother   . Emphysema Mother   . Hyperlipidemia Mother   . Hypertension Mother   . Heart disease Brother     CAD   Allergies  Allergen Reactions  . Ace Inhibitors     REACTION: cough  . [id number] Acid+Aspartame     REACTION: gi upset  . Oxaprozin     REACTION: unknown  . Pregabalin     REACTION: swelling  . Sulfonamide Derivatives     REACTION: UNKNOWN   Current Outpatient Prescriptions on File Prior to Visit  Medication Sig Dispense Refill  . aspirin 325 MG tablet Take 325 mg by mouth daily.        Marland Kitchen atorvastatin (LIPITOR) 10 MG tablet Take 10 mg by mouth daily.        . busPIRone (BUSPAR) 15 MG tablet Take 7.5-15 mg by mouth 2 (two) times daily.       . Calcium Carbonate (CALTRATE 600) 1500 MG TABS Take 2 tablets by mouth daily.        Marland Kitchen esomeprazole (NEXIUM) 40 MG capsule Take 40 mg by mouth daily before breakfast.        . hyoscyamine (LEVSIN, ANASPAZ) 0.125 MG tablet Take 0.125 mg by mouth as needed.         Marland Kitchen losartan-hydrochlorothiazide (HYZAAR) 100-25 MG per tablet Take 1 tablet by mouth daily.        . Multiple Vitamins-Iron (MULTIVITAMINS WITH IRON) TABS Take 1 tablet by mouth daily.        . ergocalciferol (VITAMIN D2) 50000 UNITS capsule Take 50,000 Units by mouth once a week. For 10 weeks             Review of Systems Review of Systems  Constitutional: Negative for fever, appetite change, fatigue and unexpected weight change.  Eyes: Negative for pain and visual disturbance.  Respiratory: Negative for cough and shortness of breath.   Cardiovascular: Negative. For cp or palp   Gastrointestinal: Negative for nausea, diarrhea and constipation.  Genitourinary: Negative for urgency and frequency.  Skin: Negative for pallor. or rash  MSK pos for joint pain/ hip  Neurological: Negative for weakness, light-headedness, numbness and headaches.  Hematological: Negative for adenopathy. Does not bruise/bleed easily.  Psychiatric/Behavioral: pos for severe stress and depressive symptoms at times          Objective:   Physical Exam  Constitutional: She appears well-developed and well-nourished. No distress.       Obese and well appearing   HENT:  Head: Normocephalic and atraumatic.  Right Ear: External ear normal.  Left Ear: External ear normal.  Nose: Nose normal.  Mouth/Throat: Oropharynx is clear and moist.  Eyes: Conjunctivae and EOM are normal. Pupils are equal, round, and reactive to light.  Neck: Normal range of motion. Neck supple. No JVD present. Carotid bruit is not present. Erythema present. No thyromegaly present.  Cardiovascular: Normal rate, regular rhythm, normal heart sounds and intact distal pulses.   Pulmonary/Chest: Effort normal and breath sounds normal. No respiratory distress. She has no wheezes.  Abdominal: Soft. Bowel sounds are normal. She exhibits no distension and no mass. There is no tenderness.  Musculoskeletal: She exhibits tenderness. She exhibits no  edema.       Poor rom both hips -- ambulation is difficult  Lymphadenopathy:    She has no cervical  adenopathy.  Neurological: She is alert. She has normal reflexes. Coordination normal.  Skin: Skin is warm and dry. No rash noted. No erythema. No pallor.  Psychiatric:       Pt is negative and somwhat bitter today- but anxious This has been her baseline for last few visits Discusses her situation at home having to care for grandchildren          Assessment & Plan:

## 2011-01-27 NOTE — Patient Instructions (Signed)
I recommend stopping celebrex if it is not helping tremendously  Take your caltrate twice daily Please add another vitamin D 3 supplement - over the counter 1000 iu day  This will help bone health and may help you feel better bp is good today  Try to eat a healthy diet  Labs and follow up as planned in august

## 2011-01-27 NOTE — Assessment & Plan Note (Signed)
Good control without changes  Rev numbers with pt

## 2011-01-27 NOTE — Assessment & Plan Note (Signed)
Pt is now on lipitor and tolerating it  Wants to wait on labs until aug- pre visit for her check up  Rev low sat fat diet

## 2011-03-12 LAB — HM MAMMOGRAPHY: HM Mammogram: NEGATIVE

## 2011-03-31 ENCOUNTER — Other Ambulatory Visit (HOSPITAL_COMMUNITY): Payer: Self-pay | Admitting: Orthopedic Surgery

## 2011-03-31 DIAGNOSIS — M1990 Unspecified osteoarthritis, unspecified site: Secondary | ICD-10-CM

## 2011-04-02 ENCOUNTER — Other Ambulatory Visit (HOSPITAL_COMMUNITY): Payer: Self-pay | Admitting: Orthopedic Surgery

## 2011-04-02 ENCOUNTER — Ambulatory Visit (HOSPITAL_COMMUNITY)
Admission: RE | Admit: 2011-04-02 | Discharge: 2011-04-02 | Disposition: A | Discharge status: Home / Self Care | Payer: Medicare Other | Source: Ambulatory Visit | Attending: Orthopedic Surgery | Admitting: Orthopedic Surgery

## 2011-04-02 DIAGNOSIS — M1990 Unspecified osteoarthritis, unspecified site: Secondary | ICD-10-CM

## 2011-04-02 DIAGNOSIS — M1610 Unilateral primary osteoarthritis, unspecified hip: Secondary | ICD-10-CM | POA: Insufficient documentation

## 2011-04-02 MED ORDER — METHYLPREDNISOLONE ACETATE 40 MG/ML INJ SUSP (RADIOLOG: 80.0000 mg | Freq: Once | INTRAMUSCULAR | Status: DC

## 2011-04-18 ENCOUNTER — Telehealth: Payer: Self-pay | Admitting: Family Medicine

## 2011-04-18 DIAGNOSIS — I10 Essential (primary) hypertension: Secondary | ICD-10-CM

## 2011-04-18 DIAGNOSIS — E7800 Pure hypercholesterolemia, unspecified: Secondary | ICD-10-CM

## 2011-04-18 DIAGNOSIS — M949 Disorder of cartilage, unspecified: Secondary | ICD-10-CM

## 2011-04-18 DIAGNOSIS — K219 Gastro-esophageal reflux disease without esophagitis: Secondary | ICD-10-CM

## 2011-04-18 DIAGNOSIS — E559 Vitamin D deficiency, unspecified: Secondary | ICD-10-CM

## 2011-04-18 DIAGNOSIS — D638 Anemia in other chronic diseases classified elsewhere: Secondary | ICD-10-CM

## 2011-04-18 DIAGNOSIS — M899 Disorder of bone, unspecified: Secondary | ICD-10-CM

## 2011-04-18 NOTE — Telephone Encounter (Signed)
Message copied by Abner Greenspan on Sat Apr 18, 2011  6:34 PM ------      Message from: Marchia Bond      Created: Wed Apr 15, 2011  1:30 PM      Regarding: cpx labs mon 8/27       Please order  future cpx labs for pt's upcomming lab appt.      Thanks      Aniceto Boss

## 2011-04-20 ENCOUNTER — Other Ambulatory Visit (INDEPENDENT_AMBULATORY_CARE_PROVIDER_SITE_OTHER): Payer: Medicare Other | Admitting: Family Medicine

## 2011-04-20 DIAGNOSIS — I10 Essential (primary) hypertension: Secondary | ICD-10-CM

## 2011-04-20 DIAGNOSIS — M899 Disorder of bone, unspecified: Secondary | ICD-10-CM

## 2011-04-20 DIAGNOSIS — K219 Gastro-esophageal reflux disease without esophagitis: Secondary | ICD-10-CM

## 2011-04-20 DIAGNOSIS — E7800 Pure hypercholesterolemia, unspecified: Secondary | ICD-10-CM

## 2011-04-20 DIAGNOSIS — E559 Vitamin D deficiency, unspecified: Secondary | ICD-10-CM

## 2011-04-20 DIAGNOSIS — M949 Disorder of cartilage, unspecified: Secondary | ICD-10-CM

## 2011-04-20 DIAGNOSIS — D638 Anemia in other chronic diseases classified elsewhere: Secondary | ICD-10-CM

## 2011-04-20 LAB — COMPREHENSIVE METABOLIC PANEL
Alkaline Phosphatase: 59 U/L (ref 39–117)
BUN: 16 mg/dL (ref 6–23)
CO2: 29 mEq/L (ref 19–32)
Creatinine, Ser: 0.8 mg/dL (ref 0.4–1.2)
GFR: 71.89 mL/min (ref >60.00)
Glucose, Bld: 98 mg/dL (ref 70–99)
Total Bilirubin: 0.3 mg/dL (ref 0.3–1.2)

## 2011-04-20 LAB — CBC WITH DIFFERENTIAL/PLATELET
Eosinophils Relative: 2.8 % (ref 0.0–5.0)
HCT: 30.1 % — ABNORMAL LOW (ref 36.0–46.0)
Hemoglobin: 10.3 g/dL — ABNORMAL LOW (ref 12.0–15.0)
Lymphocytes Relative: 25.1 % (ref 12.0–46.0)
Lymphs Abs: 1.7 10*3/uL (ref 0.7–4.0)
Monocytes Relative: 6.5 % (ref 3.0–12.0)
Neutro Abs: 4.3 10*3/uL (ref 1.4–7.7)
WBC: 6.6 10*3/uL (ref 4.5–10.5)

## 2011-04-20 LAB — LIPID PANEL
HDL: 54.9 mg/dL (ref >39.00)
Total CHOL/HDL Ratio: 3
Triglycerides: 135 mg/dL (ref 0.0–149.0)
VLDL: 27 mg/dL (ref 0.0–40.0)

## 2011-04-20 LAB — TSH: TSH: 1.4 u[IU]/mL (ref 0.35–5.50)

## 2011-04-21 LAB — VITAMIN D 25 HYDROXY (VIT D DEFICIENCY, FRACTURES): Vit D, 25-Hydroxy: 35 ng/mL (ref 30–89)

## 2011-04-22 ENCOUNTER — Ambulatory Visit (INDEPENDENT_AMBULATORY_CARE_PROVIDER_SITE_OTHER): Payer: Medicare Other | Admitting: Family Medicine

## 2011-04-22 ENCOUNTER — Encounter: Payer: Self-pay | Admitting: Family Medicine

## 2011-04-22 DIAGNOSIS — E559 Vitamin D deficiency, unspecified: Secondary | ICD-10-CM

## 2011-04-22 DIAGNOSIS — I10 Essential (primary) hypertension: Secondary | ICD-10-CM

## 2011-04-22 DIAGNOSIS — M949 Disorder of cartilage, unspecified: Secondary | ICD-10-CM

## 2011-04-22 DIAGNOSIS — E7800 Pure hypercholesterolemia, unspecified: Secondary | ICD-10-CM

## 2011-04-22 DIAGNOSIS — D638 Anemia in other chronic diseases classified elsewhere: Secondary | ICD-10-CM

## 2011-04-22 DIAGNOSIS — M899 Disorder of bone, unspecified: Secondary | ICD-10-CM

## 2011-04-22 DIAGNOSIS — F411 Generalized anxiety disorder: Secondary | ICD-10-CM

## 2011-04-22 MED ORDER — BUSPIRONE HCL 15 MG PO TABS
7.5000 mg | ORAL_TABLET | Freq: Two times a day (BID) | ORAL | Status: DC
Start: 2011-04-22 — End: 2012-05-02

## 2011-04-22 MED ORDER — ATORVASTATIN CALCIUM 10 MG PO TABS: 10.0000 mg | ORAL_TABLET | Freq: Every day | ORAL | Status: DC

## 2011-04-22 MED ORDER — ESOMEPRAZOLE MAGNESIUM 40 MG PO CPDR: 40.0000 mg | DELAYED_RELEASE_CAPSULE | Freq: Every day | ORAL | Status: DC

## 2011-04-22 MED ORDER — HYOSCYAMINE SULFATE 0.125 MG PO TABS: 0.1250 mg | ORAL_TABLET | ORAL | Status: DC | PRN

## 2011-04-22 MED ORDER — LOSARTAN POTASSIUM-HCTZ 100-25 MG PO TABS: 1.0000 | ORAL_TABLET | Freq: Every day | ORAL | Status: DC

## 2011-04-22 NOTE — Assessment & Plan Note (Signed)
Doing very well with lipitor  Rev labs with pt Rev low sat fat diet  She will let me know if it becomes too $$ for her to afford Will continue to monitor

## 2011-04-22 NOTE — Progress Notes (Signed)
Subjective:    Patient ID: [name] [name], female    DOB: [birth year], 70 y.o.   MRN: [id number]  HPI Here for check up of chronic health problems and also to rev health mt list   Doing about the same overall  Had inj in her hip - and helped some Will likely need another hip replacement  Also chronic back problems   Stress level is still through the roof - has learned to deal with it   Zoster status - had vaccine 2/12  Td 09 Pneumovax up to date  colonosc 8/10 was ok    Mam neg 7/12 No lumps or changes   dexa nl 03-- has had one since then -- thought is was with [facility] ?  Vit D level nl at 35  Lipids are good  Lab Results  Component Value Date   CHOL 163 04/20/2011   CHOL 186 06/04/2010   CHOL 299* 04/16/2010   Lab Results  Component Value Date   HDL 54.90 04/20/2011   HDL 43.70 06/04/2010   HDL 49.50 04/16/2010   Lab Results  Component Value Date   LDLCALC 81 04/20/2011   LDLCALC 106* 06/04/2010   Lab Results  Component Value Date   TRIG 135.0 04/20/2011   TRIG 184.0* 06/04/2010   TRIG 244.0* 04/16/2010   Lab Results  Component Value Date   CHOLHDL 3 04/20/2011   CHOLHDL 4 06/04/2010   CHOLHDL 6 04/16/2010   Lab Results  Component Value Date   LDLDIRECT 199.5 04/16/2010   LDLDIRECT 169.2 01/28/2009   LDLDIRECT 151.1 01/26/2008   still pays a lot for lipitor - but wants to stick with it  No side effects  Anemia worse at hb of 10.3 this check   Hx of partial hysterectomy in past  Lots of dermatology visits - has moles that are atypical - leg and thigh and wrist  Some are basal cell lesions  Had to tx psoriasis with sun in the past  She is very irritated by all the derm visits   Patient Active Problem List  Diagnoses  . UNSPECIFIED VITAMIN D DEFICIENCY  . HYPERCHOLESTEROLEMIA WITH HIGH HDL  . ANEMIA OF CHRONIC DISEASE  . ANXIETY  . DEPRESSION  . RETINAL VEIN OCCLUSION  . HYPERTENSION  . CAD  . ESOPHAGEAL STRICTURE  . GERD  . PEPTIC ULCER  DISEASE  . GALLSTONE PANCREATITIS  . URINARY TRACT INFECTION, RECURRENT  . OSTEOARTHRITIS  . OSTEOPENIA  . DYSPHAGIA  . MAMMOGRAM, ABNORMAL  . SKIN CANCER, HX OF  . FIBROCYSTIC BREAST DISEASE, HX OF   Past Medical History  Diagnosis Date  . Anemia     of chronic disease  . Hx of skin cancer, basal cell   . Psoriasis   . Depression   . Anxiety   . Hypertension   . Arthritis     osteoarthritis  . Peptic ulcer disease     H pylori  . Fibrocystic breast   . Osteopenia    Past Surgical History  Procedure Date  . Cholecystectomy   . Abdominal hysterectomy     partial in 1970s  . Breast surgery   . Knee surgery     x3  . Nasal sinus surgery   . Esophagogastroduodenoscopy 03/2009    ulcer,HP, GERD stricture  . Spine surgery     back surgery x 3  . Spine surgery 2005    deg. disk lumber spine    History  Substance Use  Topics  . Smoking status: Never Smoker   . Smokeless tobacco: Not on file  . Alcohol Use: Yes     occasional   Family History  Problem Relation Age of Onset  . Arthritis Mother   . Emphysema Mother   . Hyperlipidemia Mother   . Hypertension Mother   . Heart disease Brother     CAD   Allergies  Allergen Reactions  . Ace Inhibitors     REACTION: cough  . [id number] Acid+Aspartame     REACTION: gi upset  . Oxaprozin     REACTION: unknown  . Pregabalin     REACTION: swelling  . Sulfonamide Derivatives     REACTION: UNKNOWN   Current Outpatient Prescriptions on File Prior to Visit  Medication Sig Dispense Refill  . aspirin 325 MG tablet Take 325 mg by mouth daily.        . Calcium Carbonate (CALTRATE 600) 1500 MG TABS Take 2 tablets by mouth daily.        . Multiple Vitamins-Iron (MULTIVITAMINS WITH IRON) TABS Take 1 tablet by mouth daily.            Review of Systems Review of Systems  Constitutional: Negative for fever, appetite change,  and unexpected weight change. pos for fatigue  Eyes: Negative for pain  and visual disturbance.  Respiratory: Negative for cough and shortness of breath.   Cardiovascular: Negative.  for cp or palpitations Gastrointestinal: Negative for nausea, diarrhea and constipation.  Genitourinary: Negative for urgency and frequency.  Skin: Negative for pallor. or rash  Neurological: Negative for weakness, light-headedness, numbness and headaches.  Hematological: Negative for adenopathy. Does not bruise/bleed easily.  Psychiatric/Behavioral: pos for severe stress with some anxiety and fatigue/ no SI         Objective:   Physical Exam  Constitutional: She appears well-developed and well-nourished. No distress.       overwt and well appearing   HENT:  Head: Normocephalic and atraumatic.  Mouth/Throat: Oropharynx is clear and moist.  Eyes: Conjunctivae and EOM are normal. Pupils are equal, round, and reactive to light.  Neck: Normal range of motion. Neck supple. No JVD present. Carotid bruit is not present. No thyromegaly present.  Cardiovascular: Normal rate, regular rhythm, normal heart sounds and intact distal pulses.   Pulmonary/Chest: Effort normal and breath sounds normal. No respiratory distress. She has no wheezes.  Abdominal: Soft. Bowel sounds are normal. She exhibits no distension, no abdominal bruit and no mass. There is no tenderness.  Genitourinary: No breast swelling, tenderness, discharge or bleeding.  Musculoskeletal: Normal range of motion. She exhibits no edema and no tenderness.  Lymphadenopathy:    She has no cervical adenopathy.  Neurological: She is alert. She has normal reflexes. No cranial nerve deficit. Coordination normal.  Skin: Skin is warm and dry. No rash noted. No erythema. No pallor.  Psychiatric:       Pt seems generally down and somewhat negative - which is her baseline Good eye contact and comm skills           Assessment & Plan:

## 2011-04-22 NOTE — Assessment & Plan Note (Signed)
This is worse and I suspect with hb of 10.3 pt will need another iron infusion She declines heme ref or other tx at this time Disc symptoms to watch for and she is aware of the risks

## 2011-04-22 NOTE — Assessment & Plan Note (Signed)
Sent for last dexa Continue ca and D D level is nl now Disc safety and need for exercise

## 2011-04-22 NOTE — Assessment & Plan Note (Signed)
Improved with level in 30s Will continue current dose

## 2011-04-22 NOTE — Assessment & Plan Note (Signed)
Refilled buspar - doing ok with it  Stress continues

## 2011-04-22 NOTE — Patient Instructions (Signed)
Your anemia is quite a bit worse with HB of 10.3 so please let me know if or when you would like to return to hematology for further evaluation  Watch out for fatigue and dizziness and shortness of breath and chest pain  Continue dermatology follow ups Continue calcium and vitamin D Please send for last dexa - either [facility]

## 2011-04-29 ENCOUNTER — Telehealth: Payer: Self-pay | Admitting: *Deleted

## 2011-04-29 DIAGNOSIS — D638 Anemia in other chronic diseases classified elsewhere: Secondary | ICD-10-CM

## 2011-04-29 NOTE — Telephone Encounter (Signed)
Pt states that she was told to think about whether she wanted a hematology referral, and she does.  She says that it has been about a year since she has seen one.

## 2011-04-29 NOTE — Telephone Encounter (Signed)
Ok - will do referral

## 2011-05-08 ENCOUNTER — Other Ambulatory Visit: Payer: Self-pay | Admitting: Oncology

## 2011-05-08 ENCOUNTER — Encounter (HOSPITAL_BASED_OUTPATIENT_CLINIC_OR_DEPARTMENT_OTHER): Payer: Medicare Other | Admitting: Oncology

## 2011-05-08 DIAGNOSIS — D509 Iron deficiency anemia, unspecified: Secondary | ICD-10-CM

## 2011-05-08 DIAGNOSIS — D649 Anemia, unspecified: Secondary | ICD-10-CM

## 2011-05-08 LAB — CBC WITH DIFFERENTIAL/PLATELET
BASO%: 0.9 % (ref 0.0–2.0)
Eosinophils Absolute: 0.1 10*3/uL (ref 0.0–0.5)
HCT: 32.1 % — ABNORMAL LOW (ref 34.8–46.6)
LYMPH%: 26.7 % (ref 14.0–49.7)
MCHC: 34.7 g/dL (ref 31.5–36.0)
MCV: 87.1 fL (ref 79.5–101.0)
MONO#: 0.3 10*3/uL (ref 0.1–0.9)
NEUT%: 65.3 % (ref 38.4–76.8)
Platelets: 286 10*3/uL (ref 145–400)
WBC: 5.5 10*3/uL (ref 3.9–10.3)

## 2011-05-08 LAB — MORPHOLOGY: PLT EST: ADEQUATE

## 2011-05-11 LAB — COMPREHENSIVE METABOLIC PANEL
Albumin: 4.1 g/dL (ref 3.5–5.2)
Alkaline Phosphatase: 67 U/L (ref 39–117)
BUN: 14 mg/dL (ref 6–23)
CO2: 29 mEq/L (ref 19–32)
Calcium: 9.4 mg/dL (ref 8.4–10.5)
Chloride: 99 mEq/L (ref 96–112)
Glucose, Bld: 98 mg/dL (ref 70–99)
Potassium: 3.7 mEq/L (ref 3.5–5.3)

## 2011-05-11 LAB — LACTATE DEHYDROGENASE: LDH: 144 U/L (ref 94–250)

## 2011-05-11 LAB — FERRITIN: Ferritin: 272 ng/mL (ref 10–291)

## 2011-05-11 LAB — IRON AND TIBC
%SAT: 16 % — ABNORMAL LOW (ref 20–55)
Iron: 51 ug/dL (ref 42–145)
TIBC: 313 ug/dL (ref 250–470)

## 2011-05-11 LAB — VITAMIN B12: Vitamin B-12: 428 pg/mL (ref 211–911)

## 2011-05-21 LAB — BASIC METABOLIC PANEL
BUN: 6
CO2: 27
Calcium: 9.2
Creatinine, Ser: 0.73
GFR calc Af Amer: >60

## 2011-05-21 LAB — CBC
MCHC: 33.6
MCV: 85
Platelets: 246
RBC: 3.78 — ABNORMAL LOW
RDW: 13.4

## 2011-05-22 LAB — BASIC METABOLIC PANEL
CO2: 27
CO2: 29
CO2: 30
Calcium: 8.8
Chloride: 100
Chloride: 101
GFR calc Af Amer: >60
GFR calc non Af Amer: >60
Glucose, Bld: 106 — ABNORMAL HIGH
Glucose, Bld: 136 — ABNORMAL HIGH
Potassium: 3.5
Potassium: 4.2
Potassium: 4.5
Sodium: 136
Sodium: 136
Sodium: 138

## 2011-05-22 LAB — TYPE AND SCREEN: ABO/RH(D): A NEG

## 2011-05-22 LAB — CBC
HCT: 22.1 — ABNORMAL LOW
HCT: 24.9 — ABNORMAL LOW
HCT: 27.8 — ABNORMAL LOW
HCT: 29.2 — ABNORMAL LOW
Hemoglobin: 8.4 — ABNORMAL LOW
Hemoglobin: 9.3 — ABNORMAL LOW
Hemoglobin: 9.9 — ABNORMAL LOW
MCHC: 33.7
MCHC: 34
MCV: 84.6
MCV: 85.2
MCV: 86.3
Platelets: 181
RBC: 3.22 — ABNORMAL LOW
RDW: 13.9
RDW: 14.3
WBC: 10
WBC: 9.1

## 2011-05-22 LAB — PROTIME-INR
INR: 1
Prothrombin Time: 13.7

## 2011-08-07 ENCOUNTER — Telehealth: Payer: Self-pay | Admitting: *Deleted

## 2011-08-07 ENCOUNTER — Other Ambulatory Visit: Payer: Self-pay | Admitting: Oncology

## 2011-08-07 ENCOUNTER — Ambulatory Visit (HOSPITAL_BASED_OUTPATIENT_CLINIC_OR_DEPARTMENT_OTHER): Payer: Medicare Other | Admitting: Oncology

## 2011-08-07 ENCOUNTER — Other Ambulatory Visit (HOSPITAL_BASED_OUTPATIENT_CLINIC_OR_DEPARTMENT_OTHER): Payer: Medicare Other | Admitting: Lab

## 2011-08-07 VITALS — BP 156/72 | HR 102 | Temp 98.1°F | Ht 65.0 in | Wt 187.7 lb

## 2011-08-07 DIAGNOSIS — D649 Anemia, unspecified: Secondary | ICD-10-CM

## 2011-08-07 DIAGNOSIS — D638 Anemia in other chronic diseases classified elsewhere: Secondary | ICD-10-CM

## 2011-08-07 DIAGNOSIS — D509 Iron deficiency anemia, unspecified: Secondary | ICD-10-CM

## 2011-08-07 LAB — CBC WITH DIFFERENTIAL/PLATELET
Basophils Absolute: 0.1 10*3/uL (ref 0.0–0.1)
Eosinophils Absolute: 0.1 10*3/uL (ref 0.0–0.5)
HCT: 33.1 % — ABNORMAL LOW (ref 34.8–46.6)
HGB: 11.1 g/dL — ABNORMAL LOW (ref 11.6–15.9)
LYMPH%: 27.8 % (ref 14.0–49.7)
MCV: 88.6 fL (ref 79.5–101.0)
MONO%: 6.6 % (ref 0.0–14.0)
NEUT#: 4 10*3/uL (ref 1.5–6.5)
Platelets: 293 10*3/uL (ref 145–400)

## 2011-08-07 LAB — IRON AND TIBC
%SAT: 16 % — ABNORMAL LOW (ref 20–55)
Iron: 52 ug/dL (ref 42–145)

## 2011-08-07 NOTE — Telephone Encounter (Signed)
gave patient appointment for 11-18-2011 printed out calendar and gave to the patient

## 2011-08-11 ENCOUNTER — Telehealth: Payer: Self-pay | Admitting: *Deleted

## 2011-08-11 NOTE — Progress Notes (Signed)
OFFICE PROGRESS NOTE  CC  [name] Pardon, MD, [name] [name] [place] [address], [place] [address]  DIAGNOSIS: 70 year old female with anemia of chronic disease  PRIOR [place]  INTERVAL HISTORY: [name] [name] 70 y.o. female returns for followup visit today. She was last seen by me on 05/08/2011. Clinically she seems to be doing well without any significant problems. We did do a CBC on her today and her hemoglobin is very stable at 11.1. Back in September 2012 it was also 11.1. Clinically patient also seems to be doing well without any significant complaints. She continues to have no evidence of fevers chills night sweats headaches shortness of breath chest pains palpitations she does have some achiness otherwise no other complaints and remainder of the 10 point review of systems is negative.  MEDICAL HISTORY: Past Medical History  Diagnosis Date  . Anemia     of chronic disease  . Hx of skin cancer, basal cell   . Psoriasis   . Depression   . Anxiety   . Hypertension   . Arthritis     osteoarthritis  . Peptic ulcer disease     H pylori  . Fibrocystic breast   . Osteopenia     ALLERGIES:  is allergic to ace inhibitors; [id number] acid+aspartame; oxaprozin; pregabalin; and sulfonamide derivatives.  MEDICATIONS:  Current Outpatient Prescriptions  Medication Sig Dispense Refill  . aspirin 325 MG tablet Take 325 mg by mouth daily.        Marland Kitchen atorvastatin (LIPITOR) 10 MG tablet Take 1 tablet (10 mg total) by mouth daily.  30 tablet  11  . busPIRone (BUSPAR) 15 MG tablet Take 0.5-1 tablets (7.5-15 mg total) by mouth 2 (two) times daily.  60 tablet  11  . Calcium Carbonate (CALTRATE 600) 1500 MG TABS Take 2 tablets by mouth daily.        . cholecalciferol (VITAMIN D) 1000 UNITS tablet Take 1,000 Units by mouth daily.        Marland Kitchen esomeprazole (NEXIUM) 40 MG capsule Take 1 capsule (40 mg total) by  mouth daily before breakfast.  30 capsule  11  . hyoscyamine (LEVSIN, ANASPAZ) 0.125 MG tablet Take 1 tablet (0.125 mg total) by mouth as needed.  30 tablet  11  . losartan-hydrochlorothiazide (HYZAAR) 100-25 MG per tablet Take 1 tablet by mouth daily.  30 tablet  11  . Multiple Vitamins-Iron (MULTIVITAMINS WITH IRON) TABS Take 1 tablet by mouth daily.          SURGICAL HISTORY:  Past Surgical History  Procedure Date  . Cholecystectomy   . Abdominal hysterectomy     partial in 1970s  . Breast surgery   . Knee surgery     x3  . Nasal sinus surgery   . Esophagogastroduodenoscopy 03/2009    ulcer,HP, GERD stricture  . Spine surgery     back surgery x 3  . Spine surgery 2005    deg. disk lumber spine     REVIEW OF SYSTEMS:  Pertinent items are noted in HPI.   PHYSICAL EXAMINATION: General appearance: alert, cooperative and appears stated age Head: Normocephalic, without obvious abnormality, atraumatic Neck: no adenopathy, no carotid bruit, no JVD, supple, symmetrical, trachea midline and thyroid not enlarged, symmetric, no tenderness/mass/nodules Lymph nodes: Cervical, supraclavicular, and axillary nodes normal. Resp: clear to auscultation bilaterally and normal percussion bilaterally Back: symmetric, no curvature. ROM normal. No CVA tenderness. Cardio: regular rate and rhythm,  S1, S2 normal, no murmur, click, rub or gallop GI: soft, non-tender; bowel sounds normal; no masses,  no organomegaly Extremities: extremities normal, atraumatic, no cyanosis or edema Neurologic: Alert and oriented X 3, normal strength and tone. Normal symmetric reflexes. Normal coordination and gait  ECOG PERFORMANCE STATUS: 1 - Symptomatic but completely ambulatory  Blood pressure 156/72, pulse 102, temperature 98.1 F (36.7 C), temperature source Oral, height 5\' 5"  (1.651 m), weight 187 lb 11.2 oz (85.14 kg).  LABORATORY DATA: Lab Results  Component Value Date   WBC 6.3 08/07/2011   HGB 11.1*  08/07/2011   HCT 33.1* 08/07/2011   MCV 88.6 08/07/2011   PLT 293 08/07/2011      Chemistry      Component Value Date/Time   NA 137 05/08/2011 1000   NA 137 05/08/2011 1000   NA 137 05/08/2011 1000   NA 141 02/28/2009 1250   K 3.7 05/08/2011 1000   K 3.7 05/08/2011 1000   K 3.7 05/08/2011 1000   K 4.0 02/28/2009 1250   CL 99 05/08/2011 1000   CL 99 05/08/2011 1000   CL 99 05/08/2011 1000   CL 98 02/28/2009 1250   CO2 29 05/08/2011 1000   CO2 29 05/08/2011 1000   CO2 29 05/08/2011 1000   CO2 26 02/28/2009 1250   BUN 14 05/08/2011 1000   BUN 14 05/08/2011 1000   BUN 14 05/08/2011 1000   BUN 12 02/28/2009 1250   CREATININE 0.99 05/08/2011 1000   CREATININE 0.99 05/08/2011 1000   CREATININE 0.99 05/08/2011 1000   CREATININE 0.7 02/28/2009 1250      Component Value Date/Time   CALCIUM 9.4 05/08/2011 1000   CALCIUM 9.4 05/08/2011 1000   CALCIUM 9.4 05/08/2011 1000   CALCIUM 9.1 02/28/2009 1250   ALKPHOS 67 05/08/2011 1000   ALKPHOS 67 05/08/2011 1000   ALKPHOS 67 05/08/2011 1000   ALKPHOS 66 02/28/2009 1250   AST 13 05/08/2011 1000   AST 13 05/08/2011 1000   AST 13 05/08/2011 1000   AST 21 02/28/2009 1250   ALT 9 05/08/2011 1000   ALT 9 05/08/2011 1000   ALT 9 05/08/2011 1000   BILITOT 0.5 05/08/2011 1000   BILITOT 0.5 05/08/2011 1000   BILITOT 0.5 05/08/2011 1000   BILITOT 0.50 02/28/2009 1250       RADIOGRAPHIC STUDIES:  No results found.  ASSESSMENT: 70 year old female with anemia of chronic disease. Patient is on observation only as her hemoglobin has remained very stable. There is no need to intervene with any kind of treatment at this point. She is encouraged to the exercise and adapt a healthy lifestyle and reduce stress   PLAN: followup in 6 months time. However she can be seen sooner if need arises.   All questions were answered. The patient knows to call the clinic with any problems, questions or concerns. We can certainly see the patient much sooner if necessary.  I spent 20 minutes counseling  the patient face to face. The total time spent in the appointment was 30 minutes.    Marcy Panning, [name] [name] Medical/Oncology [facility] [phone] (beeper) [phone] (Office)  08/11/2011, 9:07 AM

## 2011-08-11 NOTE — Telephone Encounter (Signed)
patient confirmed over the phone  the new date and time on 11-19-2011 starting at 10:30am

## 2011-11-18 ENCOUNTER — Other Ambulatory Visit: Payer: Medicare Other | Admitting: Lab

## 2011-11-18 ENCOUNTER — Ambulatory Visit: Payer: Medicare Other | Admitting: Oncology

## 2011-11-19 ENCOUNTER — Telehealth: Payer: Self-pay | Admitting: *Deleted

## 2011-11-19 ENCOUNTER — Other Ambulatory Visit (HOSPITAL_BASED_OUTPATIENT_CLINIC_OR_DEPARTMENT_OTHER): Payer: Medicare Other | Admitting: Lab

## 2011-11-19 ENCOUNTER — Ambulatory Visit (HOSPITAL_BASED_OUTPATIENT_CLINIC_OR_DEPARTMENT_OTHER): Payer: Medicare Other | Admitting: Oncology

## 2011-11-19 ENCOUNTER — Encounter: Payer: Self-pay | Admitting: Oncology

## 2011-11-19 VITALS — BP 170/74 | HR 99 | Temp 98.5°F | Ht 65.0 in | Wt 188.1 lb

## 2011-11-19 DIAGNOSIS — D509 Iron deficiency anemia, unspecified: Secondary | ICD-10-CM

## 2011-11-19 DIAGNOSIS — D638 Anemia in other chronic diseases classified elsewhere: Secondary | ICD-10-CM

## 2011-11-19 DIAGNOSIS — D649 Anemia, unspecified: Secondary | ICD-10-CM

## 2011-11-19 LAB — CBC WITH DIFFERENTIAL/PLATELET
BASO%: 0.9 % (ref 0.0–2.0)
HCT: 31.6 % — ABNORMAL LOW (ref 34.8–46.6)
HGB: 10.7 g/dL — ABNORMAL LOW (ref 11.6–15.9)
MCHC: 33.8 g/dL (ref 31.5–36.0)
MONO#: 0.4 10*3/uL (ref 0.1–0.9)
NEUT%: 61.1 % (ref 38.4–76.8)
WBC: 5.5 10*3/uL (ref 3.9–10.3)
lymph#: 1.6 10*3/uL (ref 0.9–3.3)

## 2011-11-19 LAB — IRON AND TIBC
Iron: 41 ug/dL — ABNORMAL LOW (ref 42–145)
TIBC: 286 ug/dL (ref 250–470)
UIBC: 245 ug/dL (ref 125–400)

## 2011-11-19 NOTE — Patient Instructions (Signed)
We will see you back in 6 months.   We will call you with results of the blood tests.

## 2011-11-19 NOTE — Telephone Encounter (Signed)
gave patient appointment for 05-19-2012 starting at 10:00am printed out calendard and gave to the patient

## 2011-12-01 NOTE — Progress Notes (Signed)
OFFICE PROGRESS NOTE  CC  [name] Pardon, MD, MD [place] [address], [place] [address]  DIAGNOSIS: 70 year old female with anemia of chronic disease  [name] [name]  INTERVAL HISTORY: [name] [name] 71 y.o. female returns for followup visit today. She was last seen by me on 05/08/2011. Clinically she seems to be doing well without any significant problems. We did do a CBC on her today and her hemoglobin is 10.7. Previously it was 11.1. Clinically patient also seems to be doing well without any significant complaints. She continues to have no evidence of fevers chills night sweats headaches shortness of breath chest pains palpitations she does have some achiness otherwise no other complaints and remainder of the 10 point review of systems is negative.  MEDICAL HISTORY: Past Medical History  Diagnosis Date  . Anemia     of chronic disease  . Hx of skin cancer, basal cell   . Psoriasis   . Depression   . Anxiety   . Hypertension   . Arthritis     osteoarthritis  . Peptic ulcer disease     H pylori  . Fibrocystic breast   . Osteopenia     ALLERGIES:  is allergic to ace inhibitors; [id number] acid+aspartame; oxaprozin; pregabalin; and sulfonamide derivatives.  MEDICATIONS:  Current Outpatient Prescriptions  Medication Sig Dispense Refill  . aspirin 325 MG tablet Take 325 mg by mouth daily.        Marland Kitchen atorvastatin (LIPITOR) 10 MG tablet Take 1 tablet (10 mg total) by mouth daily.  30 tablet  11  . busPIRone (BUSPAR) 15 MG tablet Take 0.5-1 tablets (7.5-15 mg total) by mouth 2 (two) times daily.  60 tablet  11  . Calcium Carbonate (CALTRATE 600) 1500 MG TABS Take 2 tablets by mouth daily.        . cholecalciferol (VITAMIN D) 1000 UNITS tablet Take 1,000 Units by mouth daily.        Marland Kitchen esomeprazole (NEXIUM) 40 MG capsule Take 1 capsule (40 mg total) by mouth daily before breakfast.  30  capsule  11  . hyoscyamine (LEVSIN, ANASPAZ) 0.125 MG tablet Take 1 tablet (0.125 mg total) by mouth as needed.  30 tablet  11  . losartan-hydrochlorothiazide (HYZAAR) 100-25 MG per tablet Take 1 tablet by mouth daily.  30 tablet  11  . Multiple Vitamins-Iron (MULTIVITAMINS WITH IRON) TABS Take 1 tablet by mouth daily.          SURGICAL HISTORY:  Past Surgical History  Procedure Date  . Cholecystectomy   . Abdominal hysterectomy     partial in 1970s  . Breast surgery   . Knee surgery     x3  . Nasal sinus surgery   . Esophagogastroduodenoscopy 03/2009    ulcer,HP, GERD stricture  . Spine surgery     back surgery x 3  . Spine surgery 2005    deg. disk lumber spine     REVIEW OF SYSTEMS:  Pertinent items are noted in HPI.   PHYSICAL EXAMINATION: General appearance: alert, cooperative and appears stated age Head: Normocephalic, without obvious abnormality, atraumatic Neck: no adenopathy, no carotid bruit, no JVD, supple, symmetrical, trachea midline and thyroid not enlarged, symmetric, no tenderness/mass/nodules Lymph nodes: Cervical, supraclavicular, and axillary nodes normal. Resp: clear to auscultation bilaterally and normal percussion bilaterally Back: symmetric, no curvature. ROM normal. No CVA tenderness. Cardio: regular rate and rhythm, S1, S2 normal, no murmur, click, rub  or gallop GI: soft, non-tender; bowel sounds normal; no masses,  no organomegaly Extremities: extremities normal, atraumatic, no cyanosis or edema Neurologic: Alert and oriented X 3, normal strength and tone. Normal symmetric reflexes. Normal coordination and gait  ECOG PERFORMANCE STATUS: 1 - Symptomatic but completely ambulatory  Blood pressure 170/74, pulse 99, temperature 98.5 F (36.9 C), temperature source Oral, height 5\' 5"  (1.651 m), weight 188 lb 1.6 oz (85.322 kg).  LABORATORY DATA: Lab Results  Component Value Date   WBC 5.5 11/19/2011   HGB 10.7* 11/19/2011   HCT 31.6* 11/19/2011   MCV  87.7 11/19/2011   PLT 268 11/19/2011     RADIOGRAPHIC STUDIES:  No results found.  ASSESSMENT: 71 year old female with anemia of chronic disease. Patient is on observation only as her hemoglobin has remained pretty stable and she is aymptomatic. There is no need to intervene with any kind of treatment at this point. She is encouraged to the exercise and adapt a healthy lifestyle and reduce stress   PLAN: followup in 6 months time. However she can be seen sooner if need arises.   All questions were answered. The patient knows to call the clinic with any problems, questions or concerns. We can certainly see the patient much sooner if necessary.  I spent 20 minutes counseling the patient face to face. The total time spent in the appointment was 30 minutes.    Marcy Panning, MD Medical/Oncology [facility] [phone] (beeper) [phone] (Office)  12/01/2011, 5:20 PM

## 2011-12-02 ENCOUNTER — Telehealth: Payer: Self-pay | Admitting: *Deleted

## 2011-12-02 NOTE — Telephone Encounter (Signed)
Message copied by Lucile Crater on Wed Dec 02, 2011  9:20 AM ------      Message from: [name] [name]      Created: Tue Dec 01, 2011  3:49 PM       Call patient:[employer]

## 2011-12-02 NOTE — Telephone Encounter (Signed)
Per MD- notified pt [employer]. Pt verbalized understanding.

## 2011-12-22 ENCOUNTER — Other Ambulatory Visit (HOSPITAL_COMMUNITY): Payer: Self-pay | Admitting: Orthopedic Surgery

## 2011-12-22 DIAGNOSIS — M1611 Unilateral primary osteoarthritis, right hip: Secondary | ICD-10-CM

## 2011-12-30 ENCOUNTER — Ambulatory Visit (HOSPITAL_COMMUNITY)
Admission: RE | Admit: 2011-12-30 | Discharge: 2011-12-30 | Disposition: A | Discharge status: Home / Self Care | Payer: Medicare Other | Source: Ambulatory Visit | Attending: Orthopedic Surgery | Admitting: Orthopedic Surgery

## 2011-12-30 DIAGNOSIS — M169 Osteoarthritis of hip, unspecified: Secondary | ICD-10-CM | POA: Insufficient documentation

## 2011-12-30 DIAGNOSIS — M1611 Unilateral primary osteoarthritis, right hip: Secondary | ICD-10-CM

## 2011-12-30 DIAGNOSIS — M25559 Pain in unspecified hip: Secondary | ICD-10-CM | POA: Insufficient documentation

## 2011-12-30 DIAGNOSIS — M1610 Unilateral primary osteoarthritis, unspecified hip: Secondary | ICD-10-CM | POA: Insufficient documentation

## 2011-12-30 MED ORDER — METHYLPREDNISOLONE ACETATE 40 MG/ML INJ SUSP (RADIOLOG: 80.0000 mg | Freq: Once | INTRAMUSCULAR | Status: DC

## 2012-01-14 NOTE — Op Note (Signed)
[name] [name], RIBORDY               ACCOUNT NO.:  [id number]  MEDICAL RECORD NO.:  [id number]  LOCATION:  XRAY                         FACILITY:  [facility]  PHYSICIAN:  Tarri Glenn, M.D.  DATE OF BIRTH:  Nov 29, 1940  DATE OF PROCEDURE:  12/30/2011 DATE OF DISCHARGE:  12/30/2011                              OPERATIVE REPORT   PREOPERATIVE DIAGNOSIS:  Painful osteoarthritis of right hip.  POSTOPERATIVE DIAGNOSIS:  Painful osteoarthritis of right hip.  OPERATION:  Injection of right hip joint under fluoroscopy at [facility] Radiology Department with steroid and Xylocaine.  SURGEON:  Tarri Glenn, M.D.  ASSISTANT:  technician.  ANESTHESIA:  Local.  PROCEDURE:  She has an arthritic right hip and wanted to avoid, or at least delay, a total hip replacement and consequently requested a hip injection with steroid which has worked in the past.  Time-out performed.  Using the fluoro, I localized the hip joint and following Betadine prep and local Xylocaine worked a spinal needle down to the hip joint documented by x-ray.  I then injected 80 mg of Depo Medrol and 10 mL of lidocaine and removed the needle.  We then had her walk and her symptoms had been relieved.  She left the Radiology Department without complication.          ______________________________ Tarri Glenn, M.D.     JA/MEDQ  D:  01/13/2012  T:  01/14/2012  Job:  [id number]

## 2012-03-24 ENCOUNTER — Encounter: Payer: Self-pay | Admitting: Family Medicine

## 2012-04-14 ENCOUNTER — Other Ambulatory Visit: Payer: Self-pay | Admitting: Gastroenterology

## 2012-05-01 ENCOUNTER — Other Ambulatory Visit: Payer: Self-pay | Admitting: Family Medicine

## 2012-05-02 ENCOUNTER — Other Ambulatory Visit: Payer: Self-pay

## 2012-05-02 MED ORDER — LOSARTAN POTASSIUM-HCTZ 100-25 MG PO TABS: 1.0000 | ORAL_TABLET | Freq: Every day | ORAL | Status: DC

## 2012-05-02 MED ORDER — ATORVASTATIN CALCIUM 10 MG PO TABS: 10.0000 mg | ORAL_TABLET | Freq: Every day | ORAL | Status: DC

## 2012-05-02 MED ORDER — ESOMEPRAZOLE MAGNESIUM 40 MG PO CPDR: 40.0000 mg | DELAYED_RELEASE_CAPSULE | Freq: Every day | ORAL | Status: DC

## 2012-05-02 MED ORDER — BUSPIRONE HCL 15 MG PO TABS: 7.5000 mg | ORAL_TABLET | Freq: Two times a day (BID) | ORAL | Status: DC

## 2012-05-02 NOTE — Telephone Encounter (Signed)
Request for Losartan 100-25 0R, Nexium 40 mg 0R lipitor 10mg  0R, Buspar 15 mg 0R. Sent in to [facility] pharmacy. Patient has appointment scheduled for 05/30/12 for CPE.

## 2012-05-03 ENCOUNTER — Other Ambulatory Visit: Payer: Self-pay | Admitting: Family Medicine

## 2012-05-19 ENCOUNTER — Telehealth: Payer: Self-pay | Admitting: *Deleted

## 2012-05-19 ENCOUNTER — Encounter: Payer: Self-pay | Admitting: Oncology

## 2012-05-19 ENCOUNTER — Ambulatory Visit (HOSPITAL_BASED_OUTPATIENT_CLINIC_OR_DEPARTMENT_OTHER): Payer: Medicare Other | Admitting: Oncology

## 2012-05-19 ENCOUNTER — Other Ambulatory Visit (HOSPITAL_BASED_OUTPATIENT_CLINIC_OR_DEPARTMENT_OTHER): Payer: Medicare Other | Admitting: Lab

## 2012-05-19 VITALS — BP 162/69 | HR 97 | Temp 98.1°F | Resp 20 | Ht 65.0 in | Wt 187.5 lb

## 2012-05-19 DIAGNOSIS — D638 Anemia in other chronic diseases classified elsewhere: Secondary | ICD-10-CM

## 2012-05-19 LAB — FERRITIN: Ferritin: 200 ng/mL (ref 10–291)

## 2012-05-19 LAB — IRON AND TIBC
TIBC: 296 ug/dL (ref 250–470)
UIBC: 256 ug/dL (ref 125–400)

## 2012-05-19 LAB — CBC WITH DIFFERENTIAL/PLATELET
BASO%: 1.1 % (ref 0.0–2.0)
MCHC: 34.1 g/dL (ref 31.5–36.0)
MONO#: 0.3 10*3/uL (ref 0.1–0.9)
RBC: 3.66 10*6/uL — ABNORMAL LOW (ref 3.70–5.45)
WBC: 5.5 10*3/uL (ref 3.9–10.3)
lymph#: 1.6 10*3/uL (ref 0.9–3.3)

## 2012-05-19 NOTE — Telephone Encounter (Signed)
Gave patient appointment for [phone]

## 2012-05-19 NOTE — Progress Notes (Signed)
OFFICE PROGRESS NOTE  CC  [name], MD [place] [address], [place] [address]  DIAGNOSIS: 71 year old female with anemia of chronic disease  PRIOR [place]  INTERVAL HISTORY: Ahley Aguillon 71 y.o. female returns for followup visit today.Clinically she seems to be doing well without any significant problems.Clinically patient also seems to be doing well without any significant complaints. She continues to have no evidence of fevers chills night sweats headaches shortness of breath chest pains palpitations she does have some achiness otherwise no other complaints and remainder of the 10 point review of systems is negative.  MEDICAL HISTORY: Past Medical History  Diagnosis Date  . Anemia     of chronic disease  . Hx of skin cancer, basal cell   . Psoriasis   . Depression   . Anxiety   . Hypertension   . Arthritis     osteoarthritis  . Peptic ulcer disease     H pylori  . Fibrocystic breast   . Osteopenia     ALLERGIES:  is allergic to ace inhibitors; amoxicillin-pot clavulanate; oxaprozin; pregabalin; and sulfonamide derivatives.  MEDICATIONS:  Current Outpatient Prescriptions  Medication Sig Dispense Refill  . aspirin 325 MG tablet Take 325 mg by mouth daily.        Marland Kitchen atorvastatin (LIPITOR) 10 MG tablet Take 1 tablet (10 mg total) by mouth daily.  30 tablet  0  . busPIRone (BUSPAR) 15 MG tablet Take 0.5-1 tablets (7.5-15 mg total) by mouth 2 (two) times daily.  60 tablet  0  . Calcium Carbonate (CALTRATE 600) 1500 MG TABS Take 2 tablets by mouth daily.        . cholecalciferol (VITAMIN D) 1000 UNITS tablet Take 1,000 Units by mouth daily.        Marland Kitchen esomeprazole (NEXIUM) 40 MG capsule Take 1 capsule (40 mg total) by mouth daily before breakfast.  30 capsule  0  . hyoscyamine (LEVSIN, ANASPAZ) 0.125 MG tablet Take 1 tablet (0.125 mg total) by mouth as needed.  30 tablet  11  .  losartan-hydrochlorothiazide (HYZAAR) 100-25 MG per tablet Take 1 tablet by mouth daily.  30 tablet  0  . Multiple Vitamins-Iron (MULTIVITAMINS WITH IRON) TABS Take 1 tablet by mouth daily.          SURGICAL HISTORY:  Past Surgical History  Procedure Date  . Cholecystectomy   . Abdominal hysterectomy     partial in 1970s  . Breast surgery   . Knee surgery     x3  . Nasal sinus surgery   . Esophagogastroduodenoscopy 03/2009    ulcer,HP, GERD stricture  . Spine surgery     back surgery x 3  . Spine surgery 2005    deg. disk lumber spine     REVIEW OF SYSTEMS:  Pertinent items are noted in HPI.   PHYSICAL EXAMINATION: General appearance: alert, cooperative and appears stated age Head: Normocephalic, without obvious abnormality, atraumatic Neck: no adenopathy, no carotid bruit, no JVD, supple, symmetrical, trachea midline and thyroid not enlarged, symmetric, no tenderness/mass/nodules Lymph nodes: Cervical, supraclavicular, and axillary nodes normal. Resp: clear to auscultation bilaterally and normal percussion bilaterally Back: symmetric, no curvature. ROM normal. No CVA tenderness. Cardio: regular rate and rhythm, S1, S2 normal, no murmur, click, rub or gallop GI: soft, non-tender; bowel sounds normal; no masses,  no organomegaly Extremities: extremities normal, atraumatic, no cyanosis or edema Neurologic: Alert and oriented X 3, normal  strength and tone. Normal symmetric reflexes. Normal coordination and gait  ECOG PERFORMANCE STATUS: 1 - Symptomatic but completely ambulatory  Blood pressure 162/69, pulse 97, temperature 98.1 F (36.7 C), temperature source Oral, resp. rate 20, height 5\' 5"  (1.651 m), weight 187 lb 8 oz (85.049 kg).  LABORATORY DATA: Lab Results  Component Value Date   WBC 5.5 05/19/2012   HGB 10.9* 05/19/2012   HCT 31.9* 05/19/2012   MCV 87.4 05/19/2012   PLT 257 05/19/2012     RADIOGRAPHIC STUDIES:  No results found.  ASSESSMENT: 71 year old female  with anemia of chronic disease. Patient is on observation only as her hemoglobin has remained pretty stable and she is aymptomatic. There is no need to intervene with any kind of treatment at this point. She is encouraged to the exercise and adapt a healthy lifestyle and reduce stress   PLAN:  #1 I think overall patient is doing well her counts remained pretty stable. I have checked her iron studies today we will call her with the results. She has not required any treatments for me. Therefore my recommendation is for her to be seen on an as-needed basis with me or once a year. Certainly she should have her CBC performed every 6 months at her primary care physician's office.  All questions were answered. The patient knows to call the clinic with any problems, questions or concerns. We can certainly see the patient much sooner if necessary.  I spent 15 minutes counseling the patient face to face. The total time spent in the appointment was 30 minutes.    Marcy Panning, MD Medical/Oncology [facility] [phone] (beeper) [phone] (Office)  05/19/2012, 11:20 AM

## 2012-05-19 NOTE — Patient Instructions (Addendum)
Doing well blood count looks good.  Lab Results  Component Value Date   WBC 5.5 05/19/2012   HGB 10.9* 05/19/2012   HCT 31.9* 05/19/2012   MCV 87.4 05/19/2012   PLT 257 05/19/2012   I will see you back in 1 year  Please call if you think you need to be seen sooner

## 2012-05-24 ENCOUNTER — Telehealth: Payer: Self-pay | Admitting: Family Medicine

## 2012-05-24 DIAGNOSIS — D638 Anemia in other chronic diseases classified elsewhere: Secondary | ICD-10-CM

## 2012-05-24 DIAGNOSIS — I10 Essential (primary) hypertension: Secondary | ICD-10-CM

## 2012-05-24 DIAGNOSIS — E559 Vitamin D deficiency, unspecified: Secondary | ICD-10-CM

## 2012-05-24 DIAGNOSIS — E7800 Pure hypercholesterolemia, unspecified: Secondary | ICD-10-CM

## 2012-05-24 DIAGNOSIS — K219 Gastro-esophageal reflux disease without esophagitis: Secondary | ICD-10-CM

## 2012-05-24 DIAGNOSIS — M899 Disorder of bone, unspecified: Secondary | ICD-10-CM

## 2012-05-24 DIAGNOSIS — I2510 Atherosclerotic heart disease of native coronary artery without angina pectoris: Secondary | ICD-10-CM

## 2012-05-24 NOTE — Telephone Encounter (Signed)
Message copied by Abner Greenspan on Tue May 24, 2012 10:10 PM ------      Message from: Ellamae Sia      Created: Wed May 18, 2012  3:34 PM      Regarding: lab orders for, Wednesday, 10.2.13       Patient is scheduled for CPX labs, please order future labs, Thanks , Karna Christmas

## 2012-05-25 ENCOUNTER — Other Ambulatory Visit (INDEPENDENT_AMBULATORY_CARE_PROVIDER_SITE_OTHER): Payer: Medicare Other

## 2012-05-25 DIAGNOSIS — I2510 Atherosclerotic heart disease of native coronary artery without angina pectoris: Secondary | ICD-10-CM

## 2012-05-25 DIAGNOSIS — E7800 Pure hypercholesterolemia, unspecified: Secondary | ICD-10-CM

## 2012-05-25 DIAGNOSIS — E559 Vitamin D deficiency, unspecified: Secondary | ICD-10-CM

## 2012-05-25 DIAGNOSIS — M899 Disorder of bone, unspecified: Secondary | ICD-10-CM

## 2012-05-25 DIAGNOSIS — D638 Anemia in other chronic diseases classified elsewhere: Secondary | ICD-10-CM

## 2012-05-25 DIAGNOSIS — K219 Gastro-esophageal reflux disease without esophagitis: Secondary | ICD-10-CM

## 2012-05-25 DIAGNOSIS — I10 Essential (primary) hypertension: Secondary | ICD-10-CM

## 2012-05-25 LAB — COMPREHENSIVE METABOLIC PANEL
AST: 22 U/L (ref 0–37)
Alkaline Phosphatase: 63 U/L (ref 39–117)
BUN: 13 mg/dL (ref 6–23)
Calcium: 8.9 mg/dL (ref 8.4–10.5)
Chloride: 100 mEq/L (ref 96–112)
Creatinine, Ser: 0.9 mg/dL (ref 0.4–1.2)

## 2012-05-25 LAB — LIPID PANEL
HDL: 44.1 mg/dL (ref >39.00)
LDL Cholesterol: 97 mg/dL (ref 0–99)
Total CHOL/HDL Ratio: 4
Triglycerides: 187 mg/dL — ABNORMAL HIGH (ref 0.0–149.0)

## 2012-05-25 LAB — CBC WITH DIFFERENTIAL/PLATELET
Basophils Relative: 1.1 % (ref 0.0–3.0)
Eosinophils Relative: 2 % (ref 0.0–5.0)
MCV: 88.4 fl (ref 78.0–100.0)
Monocytes Absolute: 0.3 10*3/uL (ref 0.1–1.0)
Neutrophils Relative %: 59.1 % (ref 43.0–77.0)
RBC: 3.49 Mil/uL — ABNORMAL LOW (ref 3.87–5.11)

## 2012-05-30 ENCOUNTER — Encounter: Payer: Self-pay | Admitting: Family Medicine

## 2012-05-30 ENCOUNTER — Ambulatory Visit (INDEPENDENT_AMBULATORY_CARE_PROVIDER_SITE_OTHER): Payer: Medicare Other | Admitting: Family Medicine

## 2012-05-30 VITALS — BP 128/76 | HR 80 | Temp 98.4°F | Ht 64.25 in | Wt 187.2 lb

## 2012-05-30 DIAGNOSIS — M899 Disorder of bone, unspecified: Secondary | ICD-10-CM

## 2012-05-30 DIAGNOSIS — E559 Vitamin D deficiency, unspecified: Secondary | ICD-10-CM

## 2012-05-30 DIAGNOSIS — D638 Anemia in other chronic diseases classified elsewhere: Secondary | ICD-10-CM

## 2012-05-30 DIAGNOSIS — K589 Irritable bowel syndrome without diarrhea: Secondary | ICD-10-CM

## 2012-05-30 DIAGNOSIS — I10 Essential (primary) hypertension: Secondary | ICD-10-CM

## 2012-05-30 DIAGNOSIS — E7800 Pure hypercholesterolemia, unspecified: Secondary | ICD-10-CM

## 2012-05-30 MED ORDER — ESOMEPRAZOLE MAGNESIUM 40 MG PO CPDR: 40.0000 mg | DELAYED_RELEASE_CAPSULE | Freq: Every day | ORAL | Status: DC

## 2012-05-30 MED ORDER — HYOSCYAMINE SULFATE 0.125 MG PO TABS: 0.1250 mg | ORAL_TABLET | ORAL | Status: DC | PRN

## 2012-05-30 MED ORDER — BUSPIRONE HCL 15 MG PO TABS: 7.5000 mg | ORAL_TABLET | Freq: Two times a day (BID) | ORAL | Status: DC

## 2012-05-30 MED ORDER — LOSARTAN POTASSIUM-HCTZ 100-25 MG PO TABS: 1.0000 | ORAL_TABLET | Freq: Every day | ORAL | Status: DC

## 2012-05-30 MED ORDER — ATORVASTATIN CALCIUM 10 MG PO TABS: 10.0000 mg | ORAL_TABLET | Freq: Every day | ORAL | Status: DC

## 2012-05-30 NOTE — Progress Notes (Signed)
Subjective:    Patient ID: [name] [name], female    DOB: 03/09/1941, 71 y.o.   MRN: [id number]  HPI Here for check up of chronic medical conditions and to review health mt list  Nothing new going on   Wt is stable with bmi of 31  Anemia- sees heme Lab Results  Component Value Date   WBC 5.0 05/25/2012   HGB 10.1* 05/25/2012   HCT 30.9* 05/25/2012   MCV 88.4 05/25/2012   PLT 284.0 05/25/2012   saw Dr Chancy Milroy just a few weeks ago - was pleased with her progress- keeping an eye on her  Not taking iron   Osteopenia - last dexa a while ago  Is taking her ca and D for bone healthy  Cannot get any exercise due to chronic pain   bp is stable today  No cp or palpitations or headaches or edema  No side effects to medicines  BP Readings from Last 3 Encounters:  05/30/12 128/76  05/19/12 162/69  11/19/11 170/74      Had hyst in past  No hx of cervical cancer   Flu shot- does not want to get it , she has never taken it , she doubts its effectiveness and does not want to be counseled at all about that    colonosc 8/10- per pt normal  occ takes levsin if she really needs it for IBS symptoms   mammo 7/13- this summer  Self exam- no lumps , no exam recently   Patient Active Problem List  Diagnosis  . UNSPECIFIED VITAMIN D DEFICIENCY  . HYPERCHOLESTEROLEMIA WITH HIGH HDL  . ANEMIA OF CHRONIC DISEASE  . ANXIETY  . DEPRESSION  . RETINAL VEIN OCCLUSION  . HYPERTENSION  . CAD  . ESOPHAGEAL STRICTURE  . GERD  . PEPTIC ULCER DISEASE  . GALLSTONE PANCREATITIS  . URINARY TRACT INFECTION, RECURRENT  . OSTEOARTHRITIS  . OSTEOPENIA  . DYSPHAGIA  . MAMMOGRAM, ABNORMAL  . SKIN CANCER, HX OF  . FIBROCYSTIC BREAST DISEASE, HX OF   Past Medical History  Diagnosis Date  . Anemia     of chronic disease  . Hx of skin cancer, basal cell   . Psoriasis   . Depression   . Anxiety   . Hypertension   . Arthritis     osteoarthritis  . Peptic ulcer disease     H pylori  . Fibrocystic  breast   . Osteopenia    Past Surgical History  Procedure Date  . Cholecystectomy   . Abdominal hysterectomy     partial in 1970s  . Breast surgery   . Knee surgery     x3  . Nasal sinus surgery   . Esophagogastroduodenoscopy 03/2009    ulcer,HP, GERD stricture  . Spine surgery     back surgery x 3  . Spine surgery 2005    deg. disk lumber spine    History  Substance Use Topics  . Smoking status: Never Smoker   . Smokeless tobacco: Not on file  . Alcohol Use: Yes     occasional   Family History  Problem Relation Age of Onset  . Arthritis Mother   . Emphysema Mother   . Hyperlipidemia Mother   . Hypertension Mother   . Heart disease Brother     CAD   Allergies  Allergen Reactions  . Ace Inhibitors     REACTION: cough  . Amoxicillin-Pot Clavulanate     REACTION: gi upset  .  Oxaprozin     REACTION: unknown  . Pregabalin     REACTION: swelling  . Sulfonamide Derivatives     REACTION: UNKNOWN   Current Outpatient Prescriptions on File Prior to Visit  Medication Sig Dispense Refill  . aspirin 325 MG tablet Take 325 mg by mouth daily.        Marland Kitchen atorvastatin (LIPITOR) 10 MG tablet Take 1 tablet (10 mg total) by mouth daily.  30 tablet  0  . busPIRone (BUSPAR) 15 MG tablet Take 0.5-1 tablets (7.5-15 mg total) by mouth 2 (two) times daily.  60 tablet  0  . Calcium Carbonate (CALTRATE 600) 1500 MG TABS Take 2 tablets by mouth daily.        . cholecalciferol (VITAMIN D) 1000 UNITS tablet Take 1,000 Units by mouth daily.        Marland Kitchen esomeprazole (NEXIUM) 40 MG capsule Take 1 capsule (40 mg total) by mouth daily before breakfast.  30 capsule  0  . hyoscyamine (LEVSIN, ANASPAZ) 0.125 MG tablet Take 1 tablet (0.125 mg total) by mouth as needed.  30 tablet  11  . losartan-hydrochlorothiazide (HYZAAR) 100-25 MG per tablet Take 1 tablet by mouth daily.  30 tablet  0  . Multiple Vitamins-Iron (MULTIVITAMINS WITH IRON) TABS Take 1 tablet by mouth daily.            Review of  Systems Review of Systems  Constitutional: Negative for fever, appetite change,  and unexpected weight change. pos for fatigue  Eyes: Negative for pain and visual disturbance.  Respiratory: Negative for cough and shortness of breath.   Cardiovascular: Negative for cp or palpitations    Gastrointestinal: Negative for nausea, diarrhea and constipation.  Genitourinary: Negative for urgency and frequency.  Skin: Negative for pallor or rash   Neurological: Negative for weakness, light-headedness, numbness and headaches.  Hematological: Negative for adenopathy. Does not bruise/bleed easily.  Psychiatric/Behavioral: Negative for dysphoric mood. The patient is not nervous/anxious.  pos for very high stress at all times        Objective:   Physical Exam  Constitutional: She appears well-developed and well-nourished. No distress.       obese and well appearing   HENT:  Head: Normocephalic and atraumatic.  Right Ear: External ear normal.  Left Ear: External ear normal.  Nose: Nose normal.  Mouth/Throat: Oropharynx is clear and moist.  Eyes: Conjunctivae normal and EOM are normal. Pupils are equal, round, and reactive to light. Right eye exhibits no discharge. Left eye exhibits no discharge. No scleral icterus.  Neck: Normal range of motion. Neck supple. No JVD present. Carotid bruit is not present. No thyromegaly present.  Cardiovascular: Normal rate, regular rhythm and intact distal pulses.  Exam reveals no gallop.   Pulmonary/Chest: Effort normal and breath sounds normal. No respiratory distress. She has no wheezes. She has no rales.       No crackles   Abdominal: Soft. Bowel sounds are normal. She exhibits no distension, no abdominal bruit and no mass. There is no tenderness.  Genitourinary: No breast swelling, tenderness, discharge or bleeding.       Breast exam: No mass, nodules, thickening, tenderness, bulging, retraction, inflamation, nipple discharge or skin changes noted.  No axillary  or clavicular LA.  Chaperoned exam.    Musculoskeletal: She exhibits no edema and no tenderness.  Lymphadenopathy:    She has no cervical adenopathy.  Neurological: She is alert. She has normal reflexes. No cranial nerve deficit. She exhibits normal muscle tone.  Coordination normal.  Skin: Skin is warm and dry. No rash noted. No erythema. No pallor.  Psychiatric: Her affect is blunt.          Assessment & Plan:

## 2012-05-30 NOTE — Assessment & Plan Note (Signed)
Due for dexa  Disc ca and D Labs reviewed

## 2012-05-30 NOTE — Patient Instructions (Addendum)
We will refer you for bone density test at check out  Continue your calcium and vitamin D  Labs are stable  Try to stay as active as you can and eat a healthy diet  If you change your mind about a flu shot - call us or get it at a pharmacy

## 2012-05-30 NOTE — Assessment & Plan Note (Signed)
Refilled anti spasmotic today- does not use often utd colonosc

## 2012-05-30 NOTE — Assessment & Plan Note (Signed)
bp in fair control at this time  No changes needed  Disc lifstyle change with low sodium diet and exercise   Reviewed labs with pt today

## 2012-05-30 NOTE — Assessment & Plan Note (Signed)
Stable - under care of hematology  Rev lab No changes No iron suppl- this does not help

## 2012-05-30 NOTE — Assessment & Plan Note (Signed)
Level in 30s Enc to continue vit D daily and get outdoors

## 2012-05-30 NOTE — Assessment & Plan Note (Signed)
lipitor and diet Disc goals for lipids and reasons to control them Rev labs with pt Rev low sat fat diet in detail Lab Results  Component Value Date   CHOL 178 05/25/2012   HDL 44.10 05/25/2012   LDLCALC 97 05/25/2012   LDLDIRECT 199.5 04/16/2010   TRIG 187.0* 05/25/2012   CHOLHDL 4 05/25/2012

## 2012-07-28 ENCOUNTER — Other Ambulatory Visit (HOSPITAL_COMMUNITY): Payer: Self-pay | Admitting: Orthopedic Surgery

## 2012-07-28 DIAGNOSIS — M1611 Unilateral primary osteoarthritis, right hip: Secondary | ICD-10-CM

## 2012-08-04 ENCOUNTER — Ambulatory Visit (HOSPITAL_COMMUNITY)
Admission: RE | Admit: 2012-08-04 | Discharge: 2012-08-04 | Disposition: A | Discharge status: Home / Self Care | Payer: Medicare Other | Source: Ambulatory Visit | Attending: Orthopedic Surgery | Admitting: Orthopedic Surgery

## 2012-08-04 DIAGNOSIS — M169 Osteoarthritis of hip, unspecified: Secondary | ICD-10-CM | POA: Insufficient documentation

## 2012-08-04 DIAGNOSIS — M1611 Unilateral primary osteoarthritis, right hip: Secondary | ICD-10-CM

## 2012-08-04 DIAGNOSIS — M1610 Unilateral primary osteoarthritis, unspecified hip: Secondary | ICD-10-CM | POA: Insufficient documentation

## 2013-03-06 ENCOUNTER — Encounter: Payer: Self-pay | Admitting: Family Medicine

## 2013-03-06 ENCOUNTER — Ambulatory Visit (INDEPENDENT_AMBULATORY_CARE_PROVIDER_SITE_OTHER): Payer: Medicare Other | Admitting: Family Medicine

## 2013-03-06 VITALS — BP 138/78 | HR 93 | Temp 98.6°F | Ht 64.25 in | Wt 188.5 lb

## 2013-03-06 DIAGNOSIS — J019 Acute sinusitis, unspecified: Secondary | ICD-10-CM

## 2013-03-06 DIAGNOSIS — H109 Unspecified conjunctivitis: Secondary | ICD-10-CM | POA: Insufficient documentation

## 2013-03-06 DIAGNOSIS — B9689 Other specified bacterial agents as the cause of diseases classified elsewhere: Secondary | ICD-10-CM | POA: Insufficient documentation

## 2013-03-06 MED ORDER — LEVOFLOXACIN 500 MG PO TABS: 500.0000 mg | ORAL_TABLET | Freq: Every day | ORAL | Status: DC

## 2013-03-06 NOTE — Progress Notes (Signed)
Subjective:    Patient ID: [name] [name], female    DOB: 02/19/1941, 72 y.o.   MRN: [id number]  HPI Here for uri and eye symptoms Started with cough and post nasal drip - 1 week ago followed by ST  Chest got sore from coughing  Congested  Then itchy/ red eyes that are watering constantly - swollen lids (worse on right) - ? Pink eye , eyes were crusted shut this am   Recent exp to illness- was on an airplane  No fever   Patient Active Problem List   Diagnosis Date Noted  . Acute bacterial sinusitis 03/06/2013  . Conjunctivitis 03/06/2013  . IBS (irritable bowel syndrome) 05/30/2012  . ANEMIA OF CHRONIC DISEASE 04/16/2010  . ANXIETY 04/16/2010  . MAMMOGRAM, ABNORMAL 02/28/2010  . DYSPHAGIA 03/07/2009  . ESOPHAGEAL STRICTURE 03/04/2009  . GERD 03/04/2009  . UNSPECIFIED VITAMIN D DEFICIENCY 03/12/2008  . URINARY TRACT INFECTION, RECURRENT 08/07/2007  . OSTEOPENIA 02/21/2007  . FIBROCYSTIC BREAST DISEASE, HX OF 02/11/2007  . HYPERCHOLESTEROLEMIA WITH HIGH HDL 01/10/2007  . DEPRESSION 01/10/2007  . RETINAL VEIN OCCLUSION 01/10/2007  . HYPERTENSION 01/10/2007  . CAD 01/10/2007  . PEPTIC ULCER DISEASE 01/10/2007  . GALLSTONE PANCREATITIS 01/10/2007  . OSTEOARTHRITIS 01/10/2007  . SKIN CANCER, HX OF 01/10/2007   Past Medical History  Diagnosis Date  . Anemia     of chronic disease  . Hx of skin cancer, basal cell   . Psoriasis   . Depression   . Anxiety   . Hypertension   . Arthritis     osteoarthritis  . Peptic ulcer disease     H pylori  . Fibrocystic breast   . Osteopenia    Past Surgical History  Procedure Laterality Date  . Cholecystectomy    . Abdominal hysterectomy      partial in 1970s  . Breast surgery    . Knee surgery      x3  . Nasal sinus surgery    . Esophagogastroduodenoscopy  03/2009    ulcer,HP, GERD stricture  . Spine surgery      back surgery x 3  . Spine surgery  2005    deg. disk lumber spine    History  Substance Use Topics  .  Smoking status: Never Smoker   . Smokeless tobacco: Not on file  . Alcohol Use: Yes     Comment: occasional   Family History  Problem Relation Age of Onset  . Arthritis Mother   . Emphysema Mother   . Hyperlipidemia Mother   . Hypertension Mother   . Heart disease Brother     CAD   Allergies  Allergen Reactions  . Ace Inhibitors     REACTION: cough  . Amoxicillin-Pot Clavulanate     REACTION: gi upset  . Oxaprozin     REACTION: unknown  . Pregabalin     REACTION: swelling  . Sulfonamide Derivatives     REACTION: UNKNOWN   Current Outpatient Prescriptions on File Prior to Visit  Medication Sig Dispense Refill  . aspirin 325 MG tablet Take 325 mg by mouth daily.        Marland Kitchen atorvastatin (LIPITOR) 10 MG tablet Take 1 tablet (10 mg total) by mouth daily.  30 tablet  11  . busPIRone (BUSPAR) 15 MG tablet Take 0.5-1 tablets (7.5-15 mg total) by mouth 2 (two) times daily.  60 tablet  11  . Calcium Carbonate (CALTRATE 600) 1500 MG TABS Take 2 tablets by mouth daily.        Marland Kitchen  esomeprazole (NEXIUM) 40 MG capsule Take 1 capsule (40 mg total) by mouth daily before breakfast.  30 capsule  11  . hyoscyamine (LEVSIN, ANASPAZ) 0.125 MG tablet Take 1 tablet (0.125 mg total) by mouth as needed.  30 tablet  11  . losartan-hydrochlorothiazide (HYZAAR) 100-25 MG per tablet Take 1 tablet by mouth daily.  30 tablet  11  . Multiple Vitamins-Iron (MULTIVITAMINS WITH IRON) TABS Take 1 tablet by mouth daily.         No current facility-administered medications on file prior to visit.    Review of Systems Review of Systems  Constitutional: Negative for fever, appetite change, and unexpected weight change.  ENT pos for congestion , st/ facial pain , neg for ear pain or drainage  Eyes: Negative for pain and visual disturbance. pos for redness/ drainage/ irritation/ itching  Respiratory: Negative for wheeze  and shortness of breath.   Cardiovascular: Negative for cp or palpitations    Gastrointestinal:  Negative for nausea, diarrhea and constipation.  Genitourinary: Negative for urgency and frequency.  Skin: Negative for pallor or rash   Neurological: Negative for weakness, light-headedness, numbness and headaches.  Hematological: Negative for adenopathy. Does not bruise/bleed easily.  Psychiatric/Behavioral: Negative for dysphoric mood. The patient is not nervous/anxious.         Objective:   Physical Exam  Constitutional: She appears well-developed and well-nourished. No distress.  HENT:  Head: Normocephalic and atraumatic.  Right Ear: External ear normal.  Left Ear: External ear normal.  Mouth/Throat: Oropharynx is clear and moist. No oropharyngeal exudate.  Nares are injected and congested  Bilateral maxillary sinus tenderness TMs dull Clear post nasal drip   Eyes: EOM are normal. Pupils are equal, round, and reactive to light. Right eye exhibits discharge. Left eye exhibits discharge. No scleral icterus.  Mild conj erythema with clear discharge   Neck: Normal range of motion. Neck supple.  Cardiovascular: Normal rate and regular rhythm.   Pulmonary/Chest: Effort normal and breath sounds normal. No respiratory distress. She has no wheezes. She has no rales.  Harsh bs , good air exch  Lymphadenopathy:    She has no cervical adenopathy.  Neurological: She is alert. No cranial nerve deficit.  Skin: Skin is warm and dry. No rash noted. No pallor.  Psychiatric: She has a normal mood and affect.          Assessment & Plan:

## 2013-03-06 NOTE — Assessment & Plan Note (Signed)
Viral with uri  Disc symptomatic care - see instructions on AVS  Also disc hygiene and prev of transmission -see AVS Update if worse or any vision change

## 2013-03-06 NOTE — Assessment & Plan Note (Signed)
tx with levaquin  Disc symptomatic care - see instructions on AVS Update if not starting to improve in a week or if worsening

## 2013-03-06 NOTE — Patient Instructions (Addendum)
I think you have a viral pink eye related to a cold Also a sinus infection that has developed out of the cold Drink lots of fluids and rest Take levaquin as directed  Use lubricant eye drops to flush eyes whenever you can - and a clean cool washcloth when needed to wipe away drainage Wash hands/ linens frequently and try not to touch your eyes Update if not starting to improve in a week or if worsening

## 2013-03-17 ENCOUNTER — Encounter: Payer: Self-pay | Admitting: Family Medicine

## 2013-03-20 ENCOUNTER — Encounter: Payer: Self-pay | Admitting: *Deleted

## 2013-03-24 ENCOUNTER — Encounter (HOSPITAL_COMMUNITY): Payer: Self-pay | Admitting: Pharmacy Technician

## 2013-03-27 ENCOUNTER — Other Ambulatory Visit (HOSPITAL_COMMUNITY): Payer: Self-pay | Admitting: Orthopedic Surgery

## 2013-03-27 NOTE — Patient Instructions (Addendum)
[place]  03/27/2013   Your procedure is scheduled on: 04-04-2013  Report to [facility] at  600 AM.  Call this number if you have problems the morning of surgery [phone]   Remember:   Do not eat food or drink liquids :After Midnight.     Take these medicines the morning of surgery with A SIP OF WATER: buspirone, nexium, afrin nasal spray                                SEE [facility] PREPARING FOR SURGERY SHEET   Do not wear jewelry, make-up or nail polish.  Do not wear lotions, powders, or perfumes. You may wear deodorant.   Men may shave face and neck.  Do not bring valuables to the hospital. [place].  Contacts, dentures or bridgework may not be worn into surgery.  Leave suitcase in the car. After surgery it may be brought to your room.  For patients admitted to the hospital, checkout time is 11:00 AM the day of discharge.   Patients discharged the day of surgery will not be allowed to drive home.  Name and phone number of your driver:  Special Instructions: N/A   Please read over the following fact sheets that you were given: MRSA Information, blood fact sheet, incentive spirometer fact sheet  Call Zelphia Cairo RN pre op nurse if needed [phone]    [place].  PATIENT SIGNATURE___________________________________________  NURSE SIGNATURE_____________________________________________

## 2013-03-28 ENCOUNTER — Encounter (HOSPITAL_COMMUNITY): Payer: Self-pay

## 2013-03-28 ENCOUNTER — Ambulatory Visit (HOSPITAL_COMMUNITY)
Admission: RE | Admit: 2013-03-28 | Discharge: 2013-03-28 | Disposition: A | Discharge status: Home / Self Care | Payer: Medicare Other | Source: Ambulatory Visit | Attending: Orthopedic Surgery | Admitting: Orthopedic Surgery

## 2013-03-28 ENCOUNTER — Encounter (HOSPITAL_COMMUNITY)
Admission: RE | Admit: 2013-03-28 | Discharge: 2013-03-28 | Disposition: A | Discharge status: Home / Self Care | Payer: Medicare Other | Source: Ambulatory Visit | Attending: Orthopedic Surgery | Admitting: Orthopedic Surgery

## 2013-03-28 DIAGNOSIS — R9431 Abnormal electrocardiogram [ECG] [EKG]: Secondary | ICD-10-CM | POA: Insufficient documentation

## 2013-03-28 DIAGNOSIS — Z01810 Encounter for preprocedural cardiovascular examination: Secondary | ICD-10-CM | POA: Insufficient documentation

## 2013-03-28 DIAGNOSIS — M1710 Unilateral primary osteoarthritis, unspecified knee: Secondary | ICD-10-CM | POA: Insufficient documentation

## 2013-03-28 DIAGNOSIS — I700 Atherosclerosis of aorta: Secondary | ICD-10-CM | POA: Insufficient documentation

## 2013-03-28 DIAGNOSIS — Z01818 Encounter for other preprocedural examination: Secondary | ICD-10-CM | POA: Insufficient documentation

## 2013-03-28 DIAGNOSIS — Z01812 Encounter for preprocedural laboratory examination: Secondary | ICD-10-CM | POA: Insufficient documentation

## 2013-03-28 HISTORY — DX: Adverse effect of unspecified anesthetic, initial encounter: T41.45XA

## 2013-03-28 HISTORY — DX: Other complications of anesthesia, initial encounter: T88.59XA

## 2013-03-28 LAB — URINE MICROSCOPIC-ADD ON

## 2013-03-28 LAB — URINALYSIS, ROUTINE W REFLEX MICROSCOPIC
Nitrite: NEGATIVE
Specific Gravity, Urine: 1.027 (ref 1.005–1.030)
pH: 5 (ref 5.0–8.0)

## 2013-03-28 LAB — CBC
Hemoglobin: 10.3 g/dL — ABNORMAL LOW (ref 12.0–15.0)
MCH: 28.8 pg (ref 26.0–34.0)
Platelets: 291 10*3/uL (ref 150–400)
RBC: 3.58 MIL/uL — ABNORMAL LOW (ref 3.87–5.11)
WBC: 5.6 10*3/uL (ref 4.0–10.5)

## 2013-03-28 LAB — BASIC METABOLIC PANEL
CO2: 27 mEq/L (ref 19–32)
Calcium: 9.2 mg/dL (ref 8.4–10.5)
GFR calc non Af Amer: 55 mL/min — ABNORMAL LOW (ref >90)
Glucose, Bld: 114 mg/dL — ABNORMAL HIGH (ref 70–99)
Potassium: 3.5 mEq/L (ref 3.5–5.1)
Sodium: 135 mEq/L (ref 135–145)

## 2013-03-28 LAB — PROTIME-INR: Prothrombin Time: 13.1 seconds (ref 11.6–15.2)

## 2013-03-28 LAB — SURGICAL PCR SCREEN: Staphylococcus aureus: POSITIVE — AB

## 2013-03-28 LAB — APTT: aPTT: 29 seconds (ref 24–37)

## 2013-03-29 ENCOUNTER — Ambulatory Visit (INDEPENDENT_AMBULATORY_CARE_PROVIDER_SITE_OTHER): Payer: Medicare Other | Admitting: Family Medicine

## 2013-03-29 ENCOUNTER — Encounter: Payer: Self-pay | Admitting: Family Medicine

## 2013-03-29 VITALS — BP 124/76 | HR 95 | Temp 98.4°F | Ht 64.25 in | Wt 186.5 lb

## 2013-03-29 DIAGNOSIS — Z01818 Encounter for other preprocedural examination: Secondary | ICD-10-CM

## 2013-03-29 MED ORDER — MUPIROCIN CALCIUM 2 % NA OINT: TOPICAL_OINTMENT | Freq: Two times a day (BID) | NASAL | Status: DC

## 2013-03-29 NOTE — Patient Instructions (Addendum)
No restrictions for surgery  I will indicate that you have baseline anemia EKG and chest xray are reassuring - but if cough worsens please let me know  Hold on to this px for MRSA until you hear from the surgery office

## 2013-03-29 NOTE — Progress Notes (Signed)
Subjective:    Patient ID: [name] [name], female    DOB: [birth year], 72 y.o.   MRN: [id number]  HPI Getting ready for surgery upcoming   Her sinus infection is doing better  Dry cough persists and not wheezing  No fever  cxr was reassuring   EKG- no change from her prior   Planning hip replacement - she will have the less invasive approach  Lots of pain/ cannot walk without a cane and her gait is abnormal   Had labs - MRSA test was positive   CXR is clear - no acute changes/ she does have some atherosclerosis on aorta   Has anemia of chronic disease  Lab Results  Component Value Date   WBC 5.6 03/28/2013   HGB 10.3* 03/28/2013   HCT 31.3* 03/28/2013   MCV 87.4 03/28/2013   PLT 291 03/28/2013   She sees Dr. Chancy Milroy for that -and is stable from the fall  She will likely need a blood transfusion for surgery- she has had to have one with surgery before     Chemistry      Component Value Date/Time   NA 135 03/28/2013 1400   NA 141 02/28/2009 1250   K 3.5 03/28/2013 1400   K 4.0 02/28/2009 1250   CL 99 03/28/2013 1400   CL 98 02/28/2009 1250   CO2 27 03/28/2013 1400   CO2 26 02/28/2009 1250   BUN 14 03/28/2013 1400   BUN 12 02/28/2009 1250   CREATININE 1.00 03/28/2013 1400   CREATININE 0.7 02/28/2009 1250      Component Value Date/Time   CALCIUM 9.2 03/28/2013 1400   CALCIUM 9.1 02/28/2009 1250   ALKPHOS 63 05/25/2012 0941   ALKPHOS 66 02/28/2009 1250   AST 22 05/25/2012 0941   AST 21 02/28/2009 1250   ALT 15 05/25/2012 0941   ALT 19 02/28/2009 1250   BILITOT 0.5 05/25/2012 0941   BILITOT 0.50 02/28/2009 1250        Review of Systems Review of Systems  Constitutional: Negative for fever, appetite change, fatigue and unexpected weight change.  Eyes: Negative for pain and visual disturbance.  Respiratory: Negative for cough and shortness of breath.   Cardiovascular: Negative for cp or palpitations    Gastrointestinal: Negative for nausea, diarrhea and constipation.  Genitourinary: Negative for urgency and  frequency.  Skin: Negative for pallor or rash   MSK pos for R hip pain and difficulty with ambulation Neurological: Negative for weakness, light-headedness, numbness and headaches.  Hematological: Negative for adenopathy. Does not bruise/bleed easily.  Psychiatric/Behavioral: Negative for dysphoric mood. The patient is not nervous/anxious.         Objective:   Physical Exam  Constitutional: She appears well-developed and well-nourished. No distress.  overwt and well app  HENT:  Head: Normocephalic and atraumatic.  Right Ear: External ear normal.  Left Ear: External ear normal.  Mouth/Throat: Oropharynx is clear and moist.  Nares are boggy but clear  Eyes: Conjunctivae and EOM are normal. Pupils are equal, round, and reactive to light. No scleral icterus.  Neck: Normal range of motion. Neck supple. No JVD present. Carotid bruit is not present. No thyromegaly present.  Cardiovascular: Normal rate, regular rhythm, normal heart sounds and intact distal pulses.  Exam reveals no gallop.   Pulmonary/Chest: Breath sounds normal. No respiratory distress. She has no wheezes. She has no rales. She exhibits no tenderness.  Abdominal: She exhibits no abdominal bruit.  Musculoskeletal: She exhibits no edema.  Lymphadenopathy:  She has no cervical adenopathy.  Neurological: She is alert. She has normal reflexes. No cranial nerve deficit. She exhibits normal muscle tone. Coordination normal.  Skin: Skin is warm and dry. No rash noted.  Psychiatric: She has a normal mood and affect.          Assessment & Plan:

## 2013-03-30 NOTE — Assessment & Plan Note (Addendum)
Cleared for upcoming orthopedic surgery Pt aware with her baseline anemia of chronic dz she will likely need a blood transfusion (as she has in the past) Stable EKG and CXR Her sinus infection is much improved  Given px for nasal bactroban for MRSA- however her orthopedic physician may have already called this in

## 2013-03-30 NOTE — Progress Notes (Signed)
ua faxed 03-29-13 by epic to dr Alvan Dame

## 2013-03-31 NOTE — Progress Notes (Signed)
Medical clearance note dr tower on note

## 2013-04-02 NOTE — H&P (Signed)
TOTAL HIP ADMISSION H&P  Patient is admitted for right total hip arthroplasty, anterior approach.  Subjective:  Chief Complaint: Right hip OA / pain  HPI: [name] [name], 72 y.o. female, has a history of pain and functional disability in the right hip(s) due to arthritis and patient has failed non-surgical conservative treatments for greater than 12 weeks to include NSAID's and/or analgesics, use of assistive devices and activity modification.  Onset of symptoms was gradual starting 2.5 years ago with gradually worsening course since that time.The patient noted no past surgery on the right hip(s).  Patient currently rates pain in the right hip at 10 out of 10 with activity. Patient has worsening of pain with activity and weight bearing, trendelenberg gait, pain that interfers with activities of daily living and pain with passive range of motion. Patient has evidence of periarticular osteophytes and joint space narrowing by imaging studies. This condition presents safety issues increasing the risk of falls.  There is no current signs of infection.  Risks, benefits and expectations were discussed with the patient. Patient understand the risks, benefits and expectations and wishes to proceed with surgery.   D/C Plans:   Home with HHPT  Post-op Meds:    Rx given for ASA, Zanaflex, Iron, Colace and MiraLax  Tranexamic Acid:   To be given  Decadron:    To be given  FYI:    ASA post-op   Patient Active Problem List   Diagnosis Date Noted  . Preoperative examination 03/29/2013  . Acute bacterial sinusitis 03/06/2013  . Conjunctivitis 03/06/2013  . IBS (irritable bowel syndrome) 05/30/2012  . ANEMIA OF CHRONIC DISEASE 04/16/2010  . ANXIETY 04/16/2010  . MAMMOGRAM, ABNORMAL 02/28/2010  . DYSPHAGIA 03/07/2009  . ESOPHAGEAL STRICTURE 03/04/2009  . GERD 03/04/2009  . UNSPECIFIED VITAMIN D DEFICIENCY 03/12/2008  . URINARY TRACT INFECTION, RECURRENT 08/07/2007  . OSTEOPENIA 02/21/2007  .  FIBROCYSTIC BREAST DISEASE, HX OF 02/11/2007  . HYPERCHOLESTEROLEMIA WITH HIGH HDL 01/10/2007  . DEPRESSION 01/10/2007  . RETINAL VEIN OCCLUSION 01/10/2007  . HYPERTENSION 01/10/2007  . CAD 01/10/2007  . PEPTIC ULCER DISEASE 01/10/2007  . GALLSTONE PANCREATITIS 01/10/2007  . OSTEOARTHRITIS 01/10/2007  . SKIN CANCER, HX OF 01/10/2007   Past Medical History  Diagnosis Date  . Hx of skin cancer, basal cell 2013 and 2012  . Psoriasis   . Depression   . Anxiety   . Hypertension   . Arthritis     osteoarthritis  . Peptic ulcer disease     H pylori  . Fibrocystic breast   . Osteopenia   . Anemia     of chronic disease  . Complication of anesthesia 1980 and 1982    trouble waking up after breast biopsy    Past Surgical History  Procedure Laterality Date  . Knee surgery      x3  . Nasal sinus surgery    . Esophagogastroduodenoscopy  03/2009    ulcer,HP, GERD stricture  . Spine surgery      back surgery x 3  . Spine surgery  2005    deg. disk lumber spine   . Breast surgery    . Abdominal hysterectomy      partial in 1970s  . Cholecystectomy  2005  . Hip arthroplasty Left 2010  . Joint replacement Right 2009    knee  . Breast biopsy Left 1980 and 1982     Allergies  Allergen Reactions  . Ace Inhibitors     REACTION: cough  .  Amoxicillin-Pot Clavulanate     REACTION: gi upset  . Oxaprozin     REACTION: unknown  . Pregabalin     REACTION: swelling  . Sulfonamide Derivatives     REACTION: UNKNOWN    History  Substance Use Topics  . Smoking status: Never Smoker   . Smokeless tobacco: Never Used  . Alcohol Use: Yes     Comment: occasional    Family History  Problem Relation Age of Onset  . Arthritis Mother   . Emphysema Mother   . Hyperlipidemia Mother   . Hypertension Mother   . Heart disease Brother     CAD     Review of Systems  Constitutional: Positive for malaise/fatigue.  HENT: Positive for tinnitus.   Eyes: Negative.   Respiratory: Positive  for cough.   Cardiovascular: Negative.   Gastrointestinal: Positive for diarrhea and constipation.  Genitourinary: Positive for frequency.  Musculoskeletal: Positive for back pain and joint pain.  Skin: Negative.   Neurological: Negative.   Endo/Heme/Allergies: Negative.   Psychiatric/Behavioral: Positive for depression. The patient is nervous/anxious and has insomnia.     Objective:  Physical Exam  Constitutional: She is oriented to person, place, and time. She appears well-developed and well-nourished.  HENT:  Head: Normocephalic and atraumatic.  Mouth/Throat: Oropharynx is clear and moist.  Eyes: Pupils are equal, round, and reactive to light.  Neck: Neck supple. No JVD present. No tracheal deviation present. No thyromegaly present.  Cardiovascular: Normal rate, regular rhythm, normal heart sounds and intact distal pulses.   Respiratory: Effort normal and breath sounds normal. No stridor. No respiratory distress. She has no wheezes.  GI: Soft. There is no tenderness. There is no guarding.  Musculoskeletal:       Right hip: She exhibits decreased range of motion, decreased strength, tenderness and bony tenderness. She exhibits no swelling, no deformity and no laceration.  Lymphadenopathy:    She has no cervical adenopathy.  Neurological: She is alert and oriented to person, place, and time.  Skin: Skin is warm and dry.  Psychiatric: She has a normal mood and affect.    Labs:  Estimated body mass index is 31.89 kg/(m^2) as calculated from the following:   Height as of 05/30/12: 5' 4.25" (1.632 m).   Weight as of 05/30/12: 84.936 kg (187 lb 4 oz).   Imaging Review Plain radiographs demonstrate severe degenerative joint disease of the right hip(s). The bone quality appears to be good for age and reported activity level.  Assessment/Plan:  End stage arthritis, right hip(s)  The patient history, physical examination, clinical judgement of the provider and imaging studies are  consistent with end stage degenerative joint disease of the right hip(s) and total hip arthroplasty is deemed medically necessary. The treatment options including medical management, injection therapy, arthroscopy and arthroplasty were discussed at length. The risks and benefits of total hip arthroplasty were presented and reviewed. The risks due to aseptic loosening, infection, stiffness, dislocation/subluxation,  thromboembolic complications and other imponderables were discussed.  The patient acknowledged the explanation, agreed to proceed with the plan and consent was signed. Patient is being admitted for inpatient treatment for surgery, pain control, PT, OT, prophylactic antibiotics, VTE prophylaxis, progressive ambulation and ADL's and discharge planning.The patient is planning to be discharged home with home health services.    West Pugh Leighton Brickley   PAC  04/02/2013, 3:13 PM

## 2013-04-04 ENCOUNTER — Inpatient Hospital Stay (HOSPITAL_COMMUNITY): Payer: Medicare Other

## 2013-04-04 ENCOUNTER — Inpatient Hospital Stay (HOSPITAL_COMMUNITY)
Admission: RE | Admit: 2013-04-04 | Discharge: 2013-04-07 | DRG: 470 | Disposition: A | Discharge status: Home Health | Payer: Medicare Other | Source: Ambulatory Visit | Attending: Orthopedic Surgery | Admitting: Orthopedic Surgery

## 2013-04-04 ENCOUNTER — Encounter (HOSPITAL_COMMUNITY)
Admission: RE | Disposition: A | Discharge status: Home Health | Payer: Self-pay | Source: Ambulatory Visit | Attending: Orthopedic Surgery

## 2013-04-04 ENCOUNTER — Encounter (HOSPITAL_COMMUNITY): Payer: Self-pay | Admitting: Anesthesiology

## 2013-04-04 ENCOUNTER — Encounter (HOSPITAL_COMMUNITY): Payer: Self-pay | Admitting: *Deleted

## 2013-04-04 ENCOUNTER — Inpatient Hospital Stay (HOSPITAL_COMMUNITY): Payer: Medicare Other | Admitting: Anesthesiology

## 2013-04-04 DIAGNOSIS — D500 Iron deficiency anemia secondary to blood loss (chronic): Secondary | ICD-10-CM | POA: Diagnosis not present

## 2013-04-04 DIAGNOSIS — E663 Overweight: Secondary | ICD-10-CM | POA: Diagnosis present

## 2013-04-04 DIAGNOSIS — M1610 Unilateral primary osteoarthritis, unspecified hip: Principal | ICD-10-CM | POA: Diagnosis present

## 2013-04-04 DIAGNOSIS — Z96659 Presence of unspecified artificial knee joint: Secondary | ICD-10-CM

## 2013-04-04 DIAGNOSIS — M169 Osteoarthritis of hip, unspecified: Principal | ICD-10-CM | POA: Diagnosis present

## 2013-04-04 DIAGNOSIS — I10 Essential (primary) hypertension: Secondary | ICD-10-CM | POA: Diagnosis present

## 2013-04-04 DIAGNOSIS — F329 Major depressive disorder, single episode, unspecified: Secondary | ICD-10-CM | POA: Diagnosis present

## 2013-04-04 DIAGNOSIS — E669 Obesity, unspecified: Secondary | ICD-10-CM | POA: Diagnosis present

## 2013-04-04 DIAGNOSIS — D62 Acute posthemorrhagic anemia: Secondary | ICD-10-CM | POA: Diagnosis not present

## 2013-04-04 DIAGNOSIS — Z6830 Body mass index (BMI) 30.0-30.9, adult: Secondary | ICD-10-CM

## 2013-04-04 DIAGNOSIS — Z96649 Presence of unspecified artificial hip joint: Secondary | ICD-10-CM

## 2013-04-04 DIAGNOSIS — F3289 Other specified depressive episodes: Secondary | ICD-10-CM | POA: Diagnosis present

## 2013-04-04 HISTORY — PX: TOTAL HIP ARTHROPLASTY: SHX124

## 2013-04-04 SURGERY — ARTHROPLASTY, HIP, TOTAL, ANTERIOR APPROACH
Anesthesia: Spinal | Site: Hip | Laterality: Right | Wound class: Clean

## 2013-04-04 MED ORDER — POLYETHYLENE GLYCOL 3350 17 G PO PACK
17.0000 g | PACK | Freq: Two times a day (BID) | ORAL | Status: DC
  Administered 2013-04-04 – 2013-04-06 (×5): 17 g via ORAL

## 2013-04-04 MED ORDER — MIDAZOLAM HCL 5 MG/5ML IJ SOLN
INTRAMUSCULAR | Status: DC | PRN
  Administered 2013-04-04: 2 mg via INTRAVENOUS

## 2013-04-04 MED ORDER — FERROUS SULFATE 325 (65 FE) MG PO TABS
325.0000 mg | ORAL_TABLET | Freq: Three times a day (TID) | ORAL | Status: DC
  Administered 2013-04-05 – 2013-04-07 (×6): 325 mg via ORAL
  Filled 2013-04-04 (×11): qty 1

## 2013-04-04 MED ORDER — HYOSCYAMINE SULFATE 0.125 MG PO TABS
0.1250 mg | ORAL_TABLET | Freq: Four times a day (QID) | ORAL | Status: DC | PRN
  Filled 2013-04-04: qty 1

## 2013-04-04 MED ORDER — MENTHOL 3 MG MT LOZG: 1.0000 | LOZENGE | OROMUCOSAL | Status: DC | PRN

## 2013-04-04 MED ORDER — ATORVASTATIN CALCIUM 10 MG PO TABS
10.0000 mg | ORAL_TABLET | Freq: Every evening | ORAL | Status: DC
  Administered 2013-04-04 – 2013-04-06 (×3): 10 mg via ORAL
  Filled 2013-04-04 (×4): qty 1

## 2013-04-04 MED ORDER — ONDANSETRON HCL 4 MG PO TABS: 4.0000 mg | ORAL_TABLET | Freq: Four times a day (QID) | ORAL | Status: DC | PRN

## 2013-04-04 MED ORDER — ONDANSETRON HCL 4 MG/2ML IJ SOLN
INTRAMUSCULAR | Status: DC | PRN
  Administered 2013-04-04: 4 mg via INTRAVENOUS

## 2013-04-04 MED ORDER — PANTOPRAZOLE SODIUM 40 MG PO TBEC
80.0000 mg | DELAYED_RELEASE_TABLET | Freq: Every day | ORAL | Status: DC
  Administered 2013-04-05 – 2013-04-07 (×3): 80 mg via ORAL
  Filled 2013-04-04 (×3): qty 2

## 2013-04-04 MED ORDER — PHENYLEPHRINE HCL 10 MG/ML IJ SOLN
10.0000 mg | INTRAVENOUS | Status: DC | PRN
  Administered 2013-04-04: 30 ug/min via INTRAVENOUS

## 2013-04-04 MED ORDER — BUPIVACAINE HCL (PF) 0.5 % IJ SOLN
INTRAMUSCULAR | Status: DC | PRN
  Administered 2013-04-04: 3 mL

## 2013-04-04 MED ORDER — FLEET ENEMA 7-19 GM/118ML RE ENEM: 1.0000 | ENEMA | Freq: Once | RECTAL | Status: AC | PRN

## 2013-04-04 MED ORDER — METOCLOPRAMIDE HCL 5 MG/ML IJ SOLN
INTRAMUSCULAR | Status: DC | PRN
  Administered 2013-04-04: 10 mg via INTRAVENOUS

## 2013-04-04 MED ORDER — LOSARTAN POTASSIUM 50 MG PO TABS
100.0000 mg | ORAL_TABLET | Freq: Every day | ORAL | Status: DC
  Administered 2013-04-04 – 2013-04-07 (×3): 100 mg via ORAL
  Filled 2013-04-04 (×4): qty 2

## 2013-04-04 MED ORDER — METOCLOPRAMIDE HCL 5 MG PO TABS
5.0000 mg | ORAL_TABLET | Freq: Three times a day (TID) | ORAL | Status: DC | PRN
  Filled 2013-04-04: qty 2

## 2013-04-04 MED ORDER — HYDROCODONE-ACETAMINOPHEN 7.5-325 MG PO TABS
1.0000 | ORAL_TABLET | ORAL | Status: DC
  Administered 2013-04-04: 1 via ORAL
  Administered 2013-04-04 – 2013-04-07 (×13): 2 via ORAL
  Filled 2013-04-04 (×11): qty 2
  Filled 2013-04-04: qty 1
  Filled 2013-04-04 (×3): qty 2

## 2013-04-04 MED ORDER — DIPHENHYDRAMINE HCL 25 MG PO CAPS
25.0000 mg | ORAL_CAPSULE | Freq: Four times a day (QID) | ORAL | Status: DC | PRN
  Administered 2013-04-05: 25 mg via ORAL
  Filled 2013-04-04: qty 1

## 2013-04-04 MED ORDER — ALUM & MAG HYDROXIDE-SIMETH 200-200-20 MG/5ML PO SUSP
30.0000 mL | ORAL | Status: DC | PRN
  Administered 2013-04-05: 30 mL via ORAL
  Filled 2013-04-04: qty 30

## 2013-04-04 MED ORDER — METOCLOPRAMIDE HCL 5 MG/ML IJ SOLN: 5.0000 mg | Freq: Three times a day (TID) | INTRAMUSCULAR | Status: DC | PRN

## 2013-04-04 MED ORDER — KETAMINE HCL 10 MG/ML IJ SOLN
INTRAMUSCULAR | Status: DC | PRN
  Administered 2013-04-04: 25 mg via INTRAVENOUS

## 2013-04-04 MED ORDER — FENTANYL CITRATE 0.05 MG/ML IJ SOLN
INTRAMUSCULAR | Status: DC | PRN
  Administered 2013-04-04 (×2): 25 ug via INTRAVENOUS

## 2013-04-04 MED ORDER — DOCUSATE SODIUM 100 MG PO CAPS
100.0000 mg | ORAL_CAPSULE | Freq: Two times a day (BID) | ORAL | Status: DC
  Administered 2013-04-04 – 2013-04-07 (×6): 100 mg via ORAL

## 2013-04-04 MED ORDER — METHOCARBAMOL 100 MG/ML IJ SOLN
500.0000 mg | Freq: Four times a day (QID) | INTRAVENOUS | Status: DC | PRN
  Administered 2013-04-04 – 2013-04-07 (×2): 500 mg via INTRAVENOUS
  Filled 2013-04-04: qty 5

## 2013-04-04 MED ORDER — METHOCARBAMOL 500 MG PO TABS
500.0000 mg | ORAL_TABLET | Freq: Four times a day (QID) | ORAL | Status: DC | PRN
  Administered 2013-04-05 – 2013-04-06 (×3): 500 mg via ORAL
  Filled 2013-04-04 (×4): qty 1

## 2013-04-04 MED ORDER — ONDANSETRON HCL 4 MG/2ML IJ SOLN: 4.0000 mg | Freq: Four times a day (QID) | INTRAMUSCULAR | Status: DC | PRN

## 2013-04-04 MED ORDER — DOCUSATE SODIUM 100 MG PO CAPS: 100.0000 mg | ORAL_CAPSULE | Freq: Two times a day (BID) | ORAL | Status: DC

## 2013-04-04 MED ORDER — ASPIRIN EC 325 MG PO TBEC
325.0000 mg | DELAYED_RELEASE_TABLET | Freq: Two times a day (BID) | ORAL | Status: DC
  Administered 2013-04-05 – 2013-04-07 (×5): 325 mg via ORAL
  Filled 2013-04-04 (×7): qty 1

## 2013-04-04 MED ORDER — DEXAMETHASONE SODIUM PHOSPHATE 10 MG/ML IJ SOLN
INTRAMUSCULAR | Status: DC | PRN
  Administered 2013-04-04: 10 mg via INTRAVENOUS

## 2013-04-04 MED ORDER — MUPIROCIN 2 % EX OINT
TOPICAL_OINTMENT | CUTANEOUS | Status: AC
  Filled 2013-04-04: qty 22

## 2013-04-04 MED ORDER — HYDROCHLOROTHIAZIDE 25 MG PO TABS
25.0000 mg | ORAL_TABLET | Freq: Every day | ORAL | Status: DC
  Administered 2013-04-04 – 2013-04-07 (×3): 25 mg via ORAL
  Filled 2013-04-04 (×4): qty 1

## 2013-04-04 MED ORDER — DEXAMETHASONE SODIUM PHOSPHATE 10 MG/ML IJ SOLN
10.0000 mg | Freq: Once | INTRAMUSCULAR | Status: DC
  Filled 2013-04-04: qty 1

## 2013-04-04 MED ORDER — ZOLPIDEM TARTRATE 5 MG PO TABS
5.0000 mg | ORAL_TABLET | Freq: Every evening | ORAL | Status: DC | PRN
  Administered 2013-04-04: 5 mg via ORAL
  Filled 2013-04-04: qty 1

## 2013-04-04 MED ORDER — TRANEXAMIC ACID 100 MG/ML IV SOLN
1000.0000 mg | Freq: Once | INTRAVENOUS | Status: AC
  Administered 2013-04-04: 1000 mg via INTRAVENOUS
  Filled 2013-04-04: qty 10

## 2013-04-04 MED ORDER — HYDROMORPHONE HCL PF 1 MG/ML IJ SOLN
0.5000 mg | INTRAMUSCULAR | Status: DC | PRN
  Administered 2013-04-04 (×3): 1 mg via INTRAVENOUS
  Administered 2013-04-04: 0.5 mg via INTRAVENOUS
  Administered 2013-04-05: 1 mg via INTRAVENOUS
  Filled 2013-04-04 (×5): qty 1

## 2013-04-04 MED ORDER — DEXAMETHASONE SODIUM PHOSPHATE 10 MG/ML IJ SOLN: 10.0000 mg | Freq: Once | INTRAMUSCULAR | Status: DC

## 2013-04-04 MED ORDER — BUSPIRONE HCL 15 MG PO TABS
7.5000 mg | ORAL_TABLET | Freq: Two times a day (BID) | ORAL | Status: DC
  Administered 2013-04-04 – 2013-04-07 (×6): 7.5 mg via ORAL
  Filled 2013-04-04 (×7): qty 1

## 2013-04-04 MED ORDER — LACTATED RINGERS IV SOLN
INTRAVENOUS | Status: DC
  Administered 2013-04-04: 11:00:00 via INTRAVENOUS
  Administered 2013-04-04: 1000 mL via INTRAVENOUS
  Administered 2013-04-04: 10:00:00 via INTRAVENOUS

## 2013-04-04 MED ORDER — PROMETHAZINE HCL 25 MG/ML IJ SOLN: 6.2500 mg | INTRAMUSCULAR | Status: DC | PRN

## 2013-04-04 MED ORDER — FENTANYL CITRATE 0.05 MG/ML IJ SOLN
25.0000 ug | INTRAMUSCULAR | Status: DC | PRN
  Administered 2013-04-04: 50 ug via INTRAVENOUS

## 2013-04-04 MED ORDER — CEFAZOLIN SODIUM-DEXTROSE 2-3 GM-% IV SOLR
2.0000 g | INTRAVENOUS | Status: AC
  Administered 2013-04-04: 2 g via INTRAVENOUS

## 2013-04-04 MED ORDER — MUPIROCIN 2 % EX OINT
TOPICAL_OINTMENT | Freq: Two times a day (BID) | CUTANEOUS | Status: DC
  Administered 2013-04-04: 1 via NASAL

## 2013-04-04 MED ORDER — MEPERIDINE HCL 50 MG/ML IJ SOLN: 6.2500 mg | INTRAMUSCULAR | Status: DC | PRN

## 2013-04-04 MED ORDER — PHENOL 1.4 % MT LIQD: 1.0000 | OROMUCOSAL | Status: DC | PRN

## 2013-04-04 MED ORDER — CEFAZOLIN SODIUM-DEXTROSE 2-3 GM-% IV SOLR
2.0000 g | Freq: Four times a day (QID) | INTRAVENOUS | Status: AC
  Administered 2013-04-04 (×2): 2 g via INTRAVENOUS
  Filled 2013-04-04 (×2): qty 50

## 2013-04-04 MED ORDER — PHENYLEPHRINE HCL 10 MG/ML IJ SOLN
INTRAMUSCULAR | Status: DC | PRN
  Administered 2013-04-04: 40 ug via INTRAVENOUS
  Administered 2013-04-04 (×2): 80 ug via INTRAVENOUS

## 2013-04-04 MED ORDER — PROPOFOL INFUSION 10 MG/ML OPTIME
INTRAVENOUS | Status: DC | PRN
  Administered 2013-04-04: 50 ug/kg/min via INTRAVENOUS

## 2013-04-04 MED ORDER — OXYMETAZOLINE HCL 0.05 % NA SOLN
2.0000 | Freq: Two times a day (BID) | NASAL | Status: DC | PRN
  Filled 2013-04-04: qty 15

## 2013-04-04 MED ORDER — 0.9 % SODIUM CHLORIDE (POUR BTL) OPTIME
TOPICAL | Status: DC | PRN
  Administered 2013-04-04: 1000 mL

## 2013-04-04 MED ORDER — SODIUM CHLORIDE 0.9 % IV SOLN
100.0000 mL/h | INTRAVENOUS | Status: DC
  Administered 2013-04-04 – 2013-04-05 (×2): 100 mL/h via INTRAVENOUS
  Filled 2013-04-04 (×9): qty 1000

## 2013-04-04 MED ORDER — BISACODYL 10 MG RE SUPP: 10.0000 mg | Freq: Every day | RECTAL | Status: DC | PRN

## 2013-04-04 MED ORDER — LOSARTAN POTASSIUM-HCTZ 100-25 MG PO TABS: 1.0000 | ORAL_TABLET | Freq: Every morning | ORAL | Status: DC

## 2013-04-04 MED ORDER — STERILE WATER FOR IRRIGATION IR SOLN
Status: DC | PRN
  Administered 2013-04-04: 3000 mL

## 2013-04-04 SURGICAL SUPPLY — 40 items
ADH SKN CLS APL DERMABOND .7 (GAUZE/BANDAGES/DRESSINGS) ×1
BAG SPEC THK2 15X12 ZIP CLS (MISCELLANEOUS) ×2
BAG ZIPLOCK 12X15 (MISCELLANEOUS) ×4 IMPLANT
BLADE SAW SGTL 18X1.27X75 (BLADE) ×2 IMPLANT
CAPT HIP PF MOP ×1 IMPLANT
CLOTH BEACON ORANGE TIMEOUT ST (SAFETY) ×2 IMPLANT
DERMABOND ADVANCED (GAUZE/BANDAGES/DRESSINGS) ×1
DERMABOND ADVANCED .7 DNX12 (GAUZE/BANDAGES/DRESSINGS) ×1 IMPLANT
DRAPE C-ARM 42X120 X-RAY (DRAPES) ×2 IMPLANT
DRAPE STERI IOBAN 125X83 (DRAPES) ×2 IMPLANT
DRAPE U-SHAPE 47X51 STRL (DRAPES) ×6 IMPLANT
DRSG AQUACEL AG ADV 3.5X10 (GAUZE/BANDAGES/DRESSINGS) ×2 IMPLANT
DRSG TEGADERM 4X4.75 (GAUZE/BANDAGES/DRESSINGS) ×1 IMPLANT
DURAPREP 26ML APPLICATOR (WOUND CARE) ×2 IMPLANT
ELECT BLADE TIP CTD 4 INCH (ELECTRODE) ×2 IMPLANT
ELECT REM PT RETURN 9FT ADLT (ELECTROSURGICAL) ×2
ELECTRODE REM PT RTRN 9FT ADLT (ELECTROSURGICAL) ×1 IMPLANT
EVACUATOR 1/8 PVC DRAIN (DRAIN) IMPLANT
FACESHIELD LNG OPTICON STERILE (SAFETY) ×8 IMPLANT
GAUZE SPONGE 2X2 8PLY STRL LF (GAUZE/BANDAGES/DRESSINGS) ×1 IMPLANT
GLOVE BIOGEL PI IND STRL 7.5 (GLOVE) ×1 IMPLANT
GLOVE BIOGEL PI IND STRL 8 (GLOVE) ×1 IMPLANT
GLOVE BIOGEL PI INDICATOR 7.5 (GLOVE) ×1
GLOVE BIOGEL PI INDICATOR 8 (GLOVE) ×1
GLOVE ECLIPSE 8.0 STRL XLNG CF (GLOVE) ×2 IMPLANT
GLOVE ORTHO TXT STRL SZ7.5 (GLOVE) ×4 IMPLANT
GOWN BRE IMP PREV XXLGXLNG (GOWN DISPOSABLE) ×2 IMPLANT
GOWN STRL NON-REIN LRG LVL3 (GOWN DISPOSABLE) ×2 IMPLANT
KIT BASIN OR (CUSTOM PROCEDURE TRAY) ×2 IMPLANT
PACK TOTAL JOINT (CUSTOM PROCEDURE TRAY) ×2 IMPLANT
PADDING CAST COTTON 6X4 STRL (CAST SUPPLIES) ×2 IMPLANT
SPONGE GAUZE 2X2 STER 10/PKG (GAUZE/BANDAGES/DRESSINGS) ×1
SUCTION FRAZIER 12FR DISP (SUCTIONS) ×2 IMPLANT
SUT MNCRL AB 4-0 PS2 18 (SUTURE) ×2 IMPLANT
SUT VIC AB 1 CT1 36 (SUTURE) ×8 IMPLANT
SUT VIC AB 2-0 CT1 27 (SUTURE) ×4
SUT VIC AB 2-0 CT1 TAPERPNT 27 (SUTURE) ×2 IMPLANT
SUT VLOC 180 0 24IN GS25 (SUTURE) ×2 IMPLANT
TOWEL OR 17X26 10 PK STRL BLUE (TOWEL DISPOSABLE) ×4 IMPLANT
TRAY FOLEY CATH 14FRSI W/METER (CATHETERS) ×2 IMPLANT

## 2013-04-04 NOTE — Interval H&P Note (Signed)
History and Physical Interval Note:  04/04/2013 8:54 AM  [name] [name]  has presented today for surgery, with the diagnosis of Right Hip Osteoarthritis  The various methods of treatment have been discussed with the patient and family. After consideration of risks, benefits and other options for treatment, the patient has consented to  Procedure(s): RIGHT TOTAL HIP ARTHROPLASTY ANTERIOR APPROACH (Right) as a surgical intervention .  The patient's history has been reviewed, patient examined, no change in status, stable for surgery.  I have reviewed the patient's chart and labs.  Questions were answered to the patient's satisfaction.     Mauri Pole

## 2013-04-04 NOTE — Anesthesia Postprocedure Evaluation (Signed)
  Anesthesia Post-op Note  Patient: [name] [name]  Procedure(s) Performed: Procedure(s) (LRB): RIGHT TOTAL HIP ARTHROPLASTY ANTERIOR APPROACH (Right)  Patient Location: PACU  Anesthesia Type: Spinal  Level of Consciousness: awake and alert   Airway and Oxygen Therapy: Patient Spontanous Breathing  Post-op Pain: mild  Post-op Assessment: Post-op Vital signs reviewed, Patient's Cardiovascular Status Stable, Respiratory Function Stable, Patent Airway and No signs of Nausea or vomiting  Last Vitals:  Filed Vitals:   04/04/13 1400  BP: 148/72  Pulse: 84  Temp: 36.7 C  Resp: 16    Post-op Vital Signs: stable   Complications: No apparent anesthesia complications

## 2013-04-04 NOTE — Transfer of Care (Signed)
Immediate Anesthesia Transfer of Care Note  Patient: [name] [name]  Procedure(s) Performed: Procedure(s): RIGHT TOTAL HIP ARTHROPLASTY ANTERIOR APPROACH (Right)  Patient Location: PACU  Anesthesia Type:Spinal  Level of Consciousness: awake, alert , oriented and patient cooperative  Airway & Oxygen Therapy: Patient Spontanous Breathing and Patient connected to face mask oxygen  Post-op Assessment: Report given to PACU RN and Post -op Vital signs reviewed and stable  Post vital signs: Reviewed and stable  Complications: No apparent anesthesia complications

## 2013-04-04 NOTE — Preoperative (Signed)
Beta Blockers   Reason not to administer Beta Blockers:Not Applicable, not on home BB 

## 2013-04-04 NOTE — Progress Notes (Signed)
Utilization review completed.  

## 2013-04-04 NOTE — Anesthesia Procedure Notes (Signed)
Spinal  Patient location during procedure: OR Staffing Anesthesiologist: Dantavious Snowball Performed by: anesthesiologist  Preanesthetic Checklist Completed: patient identified, site marked, surgical consent, pre-op evaluation, timeout performed, IV checked, risks and benefits discussed and monitors and equipment checked Spinal Block Patient position: sitting Prep: Betadine Patient monitoring: heart rate, continuous pulse ox and blood pressure Approach: right paramedian Location: L2-3 Injection technique: single-shot Needle Needle type: Spinocan  Needle gauge: 22 G Needle length: 9 cm Additional Notes Expiration date of kit checked and confirmed. Patient tolerated procedure well, without complications.     

## 2013-04-04 NOTE — Anesthesia Preprocedure Evaluation (Addendum)
Anesthesia Evaluation  Patient identified by MRN, date of birth, ID band Patient awake    Reviewed: Allergy & Precautions, H&P , NPO status , Patient's Chart, lab work & pertinent test results  Airway Mallampati: II TM Distance: >3 FB Neck ROM: Full    Dental no notable dental hx.    Pulmonary neg pulmonary ROS,  breath sounds clear to auscultation  Pulmonary exam normal       Cardiovascular hypertension, Pt. on medications - CAD negative cardio ROS  Rhythm:Regular Rate:Normal     Neuro/Psych negative neurological ROS  negative psych ROS   GI/Hepatic negative GI ROS, Neg liver ROS,   Endo/Other  negative endocrine ROS  Renal/GU negative Renal ROS  negative genitourinary   Musculoskeletal negative musculoskeletal ROS (+)   Abdominal   Peds negative pediatric ROS (+)  Hematology negative hematology ROS (+)   Anesthesia Other Findings   Reproductive/Obstetrics negative OB ROS                          Anesthesia Physical Anesthesia Plan  ASA: II  Anesthesia Plan: Spinal   Post-op Pain Management:    Induction:   Airway Management Planned: Simple Face Mask  Additional Equipment:   Intra-op Plan:   Post-operative Plan:   Informed Consent: I have reviewed the patients History and Physical, chart, labs and discussed the procedure including the risks, benefits and alternatives for the proposed anesthesia with the patient or authorized representative who has indicated his/her understanding and acceptance.   Dental advisory given  Plan Discussed with: CRNA  Anesthesia Plan Comments:         Anesthesia Quick Evaluation

## 2013-04-04 NOTE — Op Note (Signed)
NAME:  [name] [name]                ACCOUNT NO.: [id number]      MEDICAL RECORD NO.: [id number]      FACILITY:  [facility]      PHYSICIAN:  Paralee Cancel D  DATE OF BIRTH:  08/17/1941     DATE OF PROCEDURE:  04/04/2013                                 OPERATIVE REPORT         PREOPERATIVE DIAGNOSIS: Right  hip osteoarthritis.      POSTOPERATIVE DIAGNOSIS:  Right hip osteoarthritis.  History of left total hip replacement     PROCEDURE:  Right total hip replacement through an anterior approach   utilizing DePuy THR system, component size 76mm pinnacle cup, a size 32+4 neutral   Altrex liner, a size 5 Hi Tri Lock stem with a 32+5 metal ball.      SURGEON:  Pietro Cassis. Alvan Dame, M.D.      ASSISTANT:  Molli Barrows, PA-C      ANESTHESIA:  Spinal.      SPECIMENS:  None.      COMPLICATIONS:  None.      BLOOD LOSS:  350 cc     DRAINS:  One Hemovac.      INDICATION OF THE PROCEDURE:  [name] [name] is a 72 y.o. female who had   presented to office for evaluation of right hip pain.  Radiographs revealed   progressive degenerative changes with bone-on-bone   articulation to the  hip joint.  The patient had painful limited range of   motion significantly affecting their overall quality of life.  The patient was failing to    respond to conservative measures, and at this point was ready   to proceed with more definitive measures.  The patient has noted progressive   degenerative changes in his hip, progressive problems and dysfunction   with regarding the hip prior to surgery.  Consent was obtained for   benefit of pain relief.  Specific risk of infection, DVT, component   failure, dislocation, need for revision surgery, as well discussion of   the anterior versus posterior approach were reviewed.  Consent was   obtained for benefit of anterior pain relief through an anterior   approach.      PROCEDURE IN DETAIL:  The patient was brought to operative theater.   Once  adequate anesthesia, preoperative antibiotics, 2gm Ancef administered.   The patient was positioned supine on the OSI Hanna table.  Once adequate   padding of boney process was carried out, we had predraped out the hip, and  used fluoroscopy to confirm orientation of the pelvis and position.      The right hip was then prepped and draped from proximal iliac crest to   mid thigh with shower curtain technique.      Time-out was performed identifying the patient, planned procedure, and   extremity.     An incision was then made 2 cm distal and lateral to the   anterior superior iliac spine extending over the orientation of the   tensor fascia lata muscle and sharp dissection was carried down to the   fascia of the muscle and protractor placed in the soft tissues.      The fascia was then incised.  The muscle belly  was identified and swept   laterally and retractor placed along the superior neck.  Following   cauterization of the circumflex vessels and removing some pericapsular   fat, a second cobra retractor was placed on the inferior neck.  A third   retractor was placed on the anterior acetabulum after elevating the   anterior rectus.  A L-capsulotomy was along the line of the   superior neck to the trochanteric fossa, then extended proximally and   distally.  Tag sutures were placed and the retractors were then placed   intracapsular.  We then identified the trochanteric fossa and   orientation of my neck cut, confirmed this radiographically   and then made a neck osteotomy with the femur on traction.  The femoral   head was removed without difficulty or complication.  Traction was let   off and retractors were placed posterior and anterior around the   acetabulum.      The labrum and foveal tissue were debrided.  I began reaming with a 7mm   reamer and reamed up to 57mm reamer with good bony bed preparation and a 50   cup was chosen.  The final 52mm Pinnacle cup was then impacted  under fluoroscopy  to confirm the depth of penetration and orientation with respect to   abduction.  A screw was placed followed by the hole eliminator.  The final   32+4 neutral Altrex liner was impacted with good visualized rim fit.  The cup was positioned anatomically within the acetabular portion of the pelvis.      At this point, the femur was rolled at 80 degrees.  Further capsule was   released off the inferior aspect of the femoral neck.  I then   released the superior capsule proximally.  The hook was placed laterally   along the femur and elevated manually and held in position with the bed   hook.  The leg was then extended and adducted with the leg rolled to 100   degrees of external rotation.  Once the proximal femur was fully   exposed, I used a box osteotome to set orientation.  I then began   broaching with the starting chili pepper broach and passed this by hand and then broached up to 5.  With the 5 broach in place I chose a high offset neck and did a trial reduction with the +1 then +5 ball.  The offset was appropriate, leg lengths   appeared to be equal, confirmed radiographically compared to the other hip that had been done in past.   Given these findings, I went ahead and dislocated the hip, repositioned all   retractors and positioned the right hip in the extended and abducted position.  The final 5 Hi Tri Lock stem was   chosen and it was impacted down to the level of neck cut.  Based on this   and the trial reduction, a 32+5 metal ball was chosen and   impacted onto a clean and dry trunnion, and the hip was reduced.  The   hip had been irrigated throughout the case again at this point.  I did   reapproximate the superior capsular leaflet to the anterior leaflet   using #1 Vicryl, placed a medium Hemovac drain deep.  The fascia of the   tensor fascia lata muscle was then reapproximated using #1 Vicryl.  The   remaining wound was closed with 2-0 Vicryl and running 4-0  Monocryl.   The  hip was cleaned, dried, and dressed sterilely using Dermabond and   Aquacel dressing.  Drain site dressed separately.  She was then brought   to recovery room in stable condition tolerating the procedure well.    Molli Barrows, PA-C was present for the entirety of the case involved from   preoperative positioning, perioperative retractor management, general   facilitation of the case, as well as primary wound closure as assistant.            Pietro Cassis Alvan Dame, M.D.            MDO/MEDQ  D:  06/16/2011  T:  06/16/2011  Job:  [id number]      Electronically Signed by Paralee Cancel M.D. on 06/22/2011 09:15:38 AM

## 2013-04-05 ENCOUNTER — Encounter (HOSPITAL_COMMUNITY): Payer: Self-pay | Admitting: Orthopedic Surgery

## 2013-04-05 DIAGNOSIS — E663 Overweight: Secondary | ICD-10-CM | POA: Diagnosis present

## 2013-04-05 DIAGNOSIS — D500 Iron deficiency anemia secondary to blood loss (chronic): Secondary | ICD-10-CM | POA: Diagnosis not present

## 2013-04-05 LAB — CBC
HCT: 24.4 % — ABNORMAL LOW (ref 36.0–46.0)
RDW: 12.6 % (ref 11.5–15.5)
WBC: 11.8 10*3/uL — ABNORMAL HIGH (ref 4.0–10.5)

## 2013-04-05 LAB — BASIC METABOLIC PANEL
BUN: 9 mg/dL (ref 6–23)
Chloride: 99 mEq/L (ref 96–112)
GFR calc Af Amer: >90 mL/min (ref >90)
Potassium: 3.9 mEq/L (ref 3.5–5.1)

## 2013-04-05 NOTE — Progress Notes (Signed)
   Subjective: 1 Day Post-Op Procedure(s) (LRB): RIGHT TOTAL HIP ARTHROPLASTY ANTERIOR APPROACH (Right)   Patient reports pain as mild, pain well controlled. No events throughout the night.  Objective:   VITALS:   Filed Vitals:   04/05/13 0500  BP: 118/61  Pulse: 93  Temp: 98.5 F (36.9 C)  Resp: 18    Neurovascular intact Dorsiflexion/Plantar flexion intact Incision: dressing C/D/I No cellulitis present Compartment soft  LABS  Recent Labs  04/05/13 0410  HGB 8.1*  HCT 24.4*  WBC 11.8*  PLT 194     Recent Labs  04/05/13 0410  NA 134*  K 3.9  BUN 9  CREATININE 0.77  GLUCOSE 162*     Assessment/Plan: 1 Day Post-Op Procedure(s) (LRB): RIGHT TOTAL HIP ARTHROPLASTY ANTERIOR APPROACH (Right) HV drain d/c'ed Foley cath d/c'ed Advance diet Up with therapy D/C IV fluids Discharge home with home health, eventually when ready  Expected ABLA  Treated with iron and will observe  Obese (BMI 30-39.9)  Estimated body mass index is 31.91 kg/(m^2) as calculated from the following:   Height as of this encounter: 5\' 4"  (1.626 m).   Weight as of this encounter: 84.369 kg (186 lb). Patient also counseled that weight may inhibit the healing process Patient counseled that losing weight will help with future health issues     West Pugh. Quint Chestnut   PAC  04/05/2013, 2:20 PM

## 2013-04-05 NOTE — Evaluation (Signed)
Physical Therapy Evaluation Patient Details Name: [name] [name] MRN: [id number] DOB: 12/19/1940 Today's Date: 04/05/2013 Time: [id number] PT Time Calculation (min): 30 min  PT Assessment / Plan / Recommendation History of Present Illness     Clinical Impression  Pt s/p R THR presents with decreased R LE strength/ROM and post op pain limiting functional mobility.    PT Assessment  Patient needs continued PT services    Follow Up Recommendations  Home health PT    Does the patient have the potential to tolerate intense rehabilitation      Barriers to Discharge        Equipment Recommendations  Rolling walker with 5" wheels    Recommendations for Other Services OT consult   Frequency 7X/week    Precautions / Restrictions Precautions Precautions: Fall Restrictions Weight Bearing Restrictions: No Other Position/Activity Restrictions: WBAT   Pertinent Vitals/Pain 5/10      Mobility  Bed Mobility Bed Mobility: Supine to Sit Supine to Sit: 3: Mod assist Details for Bed Mobility Assistance: cues for sequence and use of L LE to self assist Transfers Transfers: Sit to Stand;Stand to Sit Sit to Stand: 4: Min assist;3: Mod assist Stand to Sit: 4: Min assist;3: Mod assist Details for Transfer Assistance: cues for LE management and use of UEs to self assist Ambulation/Gait Ambulation/Gait Assistance: 4: Min assist;3: Mod assist Ambulation Distance (Feet): 41 Feet Assistive device: Rolling walker Ambulation/Gait Assistance Details: cues for sequence, posture and position from RW Gait Pattern: Step-to pattern;Trunk flexed;Antalgic;Decreased step length - right;Decreased step length - left    Exercises Total Joint Exercises Ankle Circles/Pumps: AROM;10 reps;Both;Supine Quad Sets: AROM;10 reps;Both;Supine Heel Slides: AAROM;15 reps;Supine;Right Hip ABduction/ADduction: AAROM;Right;10 reps;Supine   PT Diagnosis: Difficulty walking  PT Problem List: Decreased  strength;Decreased range of motion;Decreased activity tolerance;Decreased mobility;Decreased knowledge of use of DME;Pain;Decreased safety awareness PT Treatment Interventions: DME instruction;Gait training;Stair training;Functional mobility training;Therapeutic activities;Therapeutic exercise;Patient/family education     PT Goals(Current goals can be found in the care plan section) Acute Rehab PT Goals Patient Stated Goal: Resume previous lifestyle with decreased pain PT Goal Formulation: With patient Time For Goal Achievement: 04/12/13 Potential to Achieve Goals: Good  Visit Information  Last PT Received On: 04/05/13 Assistance Needed: +1       Prior [place] expects to be discharged to:: Private residence Living Arrangements: Spouse/significant other;Children Available Help at Discharge: Family Type of Home: House Home Access: Stairs to enter Technical brewer of Steps: 3 Entrance Stairs-Rails: Right;Left;Can reach both [place]: One [place]: [place] - single point Prior Function Level of Independence: Independent;Independent with assistive device(s) Communication Communication: No difficulties Dominant Hand: Right    Cognition  Cognition Arousal/Alertness: Awake/alert Behavior During Therapy: WFL for tasks assessed/performed Overall Cognitive Status: Within Functional Limits for tasks assessed    Extremity/Trunk Assessment Upper Extremity Assessment Upper Extremity Assessment: Overall WFL for tasks assessed Lower Extremity Assessment Lower Extremity Assessment: RLE deficits/detail RLE Deficits / Details: hip strength 2+/5 with AAROM at hip to 80 flex and 15 abd   Balance    End of Session PT - End of Session Equipment Utilized During Treatment: Gait belt Activity Tolerance: Patient tolerated treatment well Patient left: in chair;with call bell/phone within reach Nurse Communication: Mobility status  GP      [name] [name] 04/05/2013, 12:40 PM

## 2013-04-05 NOTE — Progress Notes (Signed)
Physical Therapy Treatment Patient Details Name: [name] [name] MRN: [id number] DOB: 1940/12/18 Today's Date: 04/05/2013 Time: [id number] PT Time Calculation (min): 23 min  PT Assessment / Plan / Recommendation  History of Present Illness     PT Comments     Follow Up Recommendations  Home health PT     Does the patient have the potential to tolerate intense rehabilitation     Barriers to Discharge        Equipment Recommendations  Rolling walker with 5" wheels    Recommendations for Other Services OT consult  Frequency 7X/week   Progress towards PT Goals Progress towards PT goals: Progressing toward goals  Plan Current plan remains appropriate    Precautions / Restrictions Precautions Precautions: Fall Restrictions Weight Bearing Restrictions: No Other Position/Activity Restrictions: WBAT   Pertinent Vitals/Pain 4/10; premed, ice pack provided    Mobility  Bed Mobility Bed Mobility: Sit to Supine Sit to Supine: 4: Min assist Details for Bed Mobility Assistance: cues for sequence and use of L LE to self assist Transfers Transfers: Sit to Stand;Stand to Sit Sit to Stand: 4: Min assist Stand to Sit: 4: Min assist Details for Transfer Assistance: cues for LE management and use of UEs to self assist Ambulation/Gait Ambulation/Gait Assistance: 4: Min assist Ambulation Distance (Feet): 170 Feet Assistive device: Rolling walker Ambulation/Gait Assistance Details: cues for initial sequence, posture and position from RW Gait Pattern: Step-to pattern;Step-through pattern;Antalgic    Exercises     PT Diagnosis:    PT Problem List:   PT Treatment Interventions:     PT Goals (current goals can now be found in the care plan section) Acute Rehab PT Goals Patient Stated Goal: Resume previous lifestyle with decreased pain PT Goal Formulation: With patient Time For Goal Achievement: 04/12/13 Potential to Achieve Goals: Good  Visit Information  Last PT Received On:  04/05/13 Assistance Needed: +1    Subjective Data  Patient Stated Goal: Resume previous lifestyle with decreased pain   Cognition  Cognition Arousal/Alertness: Awake/alert Behavior During Therapy: WFL for tasks assessed/performed Overall Cognitive Status: Within Functional Limits for tasks assessed    Balance     End of Session PT - End of Session Equipment Utilized During Treatment: Gait belt Activity Tolerance: Patient tolerated treatment well Patient left: with call bell/phone within reach;in bed Nurse Communication: Mobility status   GP     [name] [name] 04/05/2013, 5:22 PM

## 2013-04-05 NOTE — Progress Notes (Signed)
[facility]   [facility] is providing the following services: RW.  Patient declined commode.  She already has one at home.   If patient discharges after hours, please call [phone].   Linward Headland 04/05/2013, 8:58 AM

## 2013-04-06 ENCOUNTER — Telehealth: Payer: Self-pay | Admitting: Family Medicine

## 2013-04-06 LAB — BASIC METABOLIC PANEL
BUN: 7 mg/dL (ref 6–23)
CO2: 31 mEq/L (ref 19–32)
Chloride: 95 mEq/L — ABNORMAL LOW (ref 96–112)
Glucose, Bld: 107 mg/dL — ABNORMAL HIGH (ref 70–99)
Potassium: 3.4 mEq/L — ABNORMAL LOW (ref 3.5–5.1)

## 2013-04-06 LAB — CBC
HCT: 22.4 % — ABNORMAL LOW (ref 36.0–46.0)
Hemoglobin: 7.4 g/dL — ABNORMAL LOW (ref 12.0–15.0)
MCHC: 33 g/dL (ref 30.0–36.0)
RBC: 2.61 MIL/uL — ABNORMAL LOW (ref 3.87–5.11)
WBC: 10.1 10*3/uL (ref 4.0–10.5)

## 2013-04-06 LAB — PREPARE RBC (CROSSMATCH)

## 2013-04-06 MED ORDER — FUROSEMIDE 10 MG/ML IJ SOLN
10.0000 mg | Freq: Once | INTRAMUSCULAR | Status: DC
  Filled 2013-04-06: qty 1

## 2013-04-06 MED ORDER — DSS 100 MG PO CAPS: 100.0000 mg | ORAL_CAPSULE | Freq: Two times a day (BID) | ORAL | Status: DC

## 2013-04-06 MED ORDER — ACETAMINOPHEN 325 MG PO TABS
ORAL_TABLET | ORAL | Status: AC
  Filled 2013-04-06: qty 2

## 2013-04-06 MED ORDER — HYDROCODONE-ACETAMINOPHEN 7.5-325 MG PO TABS: 1.0000 | ORAL_TABLET | ORAL | Status: DC | PRN

## 2013-04-06 MED ORDER — FUROSEMIDE 10 MG/ML IJ SOLN
10.0000 mg | Freq: Once | INTRAMUSCULAR | Status: AC
  Administered 2013-04-06: 10 mg via INTRAVENOUS
  Filled 2013-04-06: qty 1

## 2013-04-06 MED ORDER — TIZANIDINE HCL 4 MG PO CAPS: 4.0000 mg | ORAL_CAPSULE | Freq: Three times a day (TID) | ORAL | Status: DC | PRN

## 2013-04-06 MED ORDER — FERROUS SULFATE 325 (65 FE) MG PO TABS: 325.0000 mg | ORAL_TABLET | Freq: Three times a day (TID) | ORAL | Status: DC

## 2013-04-06 MED ORDER — ASPIRIN 325 MG PO TBEC: 325.0000 mg | DELAYED_RELEASE_TABLET | Freq: Two times a day (BID) | ORAL | Status: AC

## 2013-04-06 MED ORDER — ACETAMINOPHEN 325 MG PO TABS
650.0000 mg | ORAL_TABLET | ORAL | Status: DC | PRN
  Administered 2013-04-06: 650 mg via ORAL

## 2013-04-06 MED ORDER — POLYETHYLENE GLYCOL 3350 17 G PO PACK: 17.0000 g | PACK | Freq: Two times a day (BID) | ORAL | Status: DC

## 2013-04-06 NOTE — Progress Notes (Signed)
Physical Therapy Treatment Patient Details Name: [name] [name] MRN: [id number] DOB: 1940-11-14 Today's Date: 04/06/2013 Time: [id number] PT Time Calculation (min): 13 min  PT Assessment / Plan / Recommendation  History of Present Illness     PT Comments   OOB deferred pending blood transfusion.  Will follow in pm after first unit.  Follow Up Recommendations  Home health PT     Does the patient have the potential to tolerate intense rehabilitation     Barriers to Discharge        Equipment Recommendations  Rolling walker with 5" wheels    Recommendations for Other Services OT consult  Frequency 7X/week   Progress towards PT Goals    Plan Current plan remains appropriate    Precautions / Restrictions Precautions Precautions: Fall Restrictions Weight Bearing Restrictions: No Other Position/Activity Restrictions: WBAT   Pertinent Vitals/Pain 5/10; premed, pt declines ice pack    Mobility       Exercises Total Joint Exercises Ankle Circles/Pumps: AROM;Both;Supine;15 reps Quad Sets: AROM;Both;Supine;15 reps Heel Slides: AAROM;Supine;Right;20 reps Hip ABduction/ADduction: AAROM;Right;Supine;15 reps   PT Diagnosis:    PT Problem List:   PT Treatment Interventions:     PT Goals (current goals can now be found in the care plan section) Acute Rehab PT Goals Patient Stated Goal: Resume previous lifestyle with decreased pain PT Goal Formulation: With patient Time For Goal Achievement: 04/12/13 Potential to Achieve Goals: Good  Visit Information  Last PT Received On: 04/06/13 Assistance Needed: +1    Subjective Data  Subjective: I had an awful night Patient Stated Goal: Resume previous lifestyle with decreased pain   Cognition  Cognition Arousal/Alertness: Awake/alert Behavior During Therapy: WFL for tasks assessed/performed Overall Cognitive Status: Within Functional Limits for tasks assessed    Balance     End of Session PT - End of Session Activity  Tolerance: Patient tolerated treatment well Patient left: in chair;with call bell/phone within reach Nurse Communication: Mobility status   GP     Natividad Schlosser 04/06/2013, 12:44 PM

## 2013-04-06 NOTE — Progress Notes (Signed)
   Subjective: 2 Days Post-Op Procedure(s) (LRB): RIGHT TOTAL HIP ARTHROPLASTY ANTERIOR APPROACH (Right)   Patient reports pain as mild, pain well controlled. No events throughout the night. A little dizzy when getting up this morning. States that she has a history anemia and says that she doesn't recover her H&H quickly.   Objective:   VITALS:   Filed Vitals:   04/06/13 0509  BP: 130/74  Pulse: 89  Temp: 98.8 F (37.1 C)  Resp: 17    Neurovascular intact Dorsiflexion/Plantar flexion intact Incision: dressing C/D/I No cellulitis present Compartment soft  LABS  Recent Labs  04/05/13 0410 04/06/13 0415  HGB 8.1* 7.4*  HCT 24.4* 22.4*  WBC 11.8* 10.1  PLT 194 172     Recent Labs  04/05/13 0410 04/06/13 0415  NA 134* 132*  K 3.9 3.4*  BUN 9 7  CREATININE 0.77 0.78  GLUCOSE 162* 107*     Assessment/Plan: 2 Days Post-Op Procedure(s) (LRB): RIGHT TOTAL HIP ARTHROPLASTY ANTERIOR APPROACH (Right) Transfuse 2 units of blood today PT will work with her in between the units of blood and after if available. Up with therapy Discharge home with home health if she is doing well after 2 units of blood and does well with PT.  ABLA  Treated with 2 units of blood.  Obese (BMI 30-39.9)  Estimated body mass index is 31.91 kg/(m^2) as calculated from the following:      Height as of this encounter: 5\' 4"  (1.626 m).      Weight as of this encounter: 84.369 kg (186 lb).  Patient also counseled that weight may inhibit the healing process  Patient counseled that losing weight will help with future health issues     West Pugh. Isis Costanza   PAC  04/06/2013, 10:16 AM

## 2013-04-06 NOTE — Telephone Encounter (Signed)
Pt just had hip replacement on Tuesday. Her daughter Deloris Ping called because she is concerned about her mother's blood levels.  The doctors at the hospital will not give her a blood transfusion because they said this is typical for her hemoglobin to be low after surgery.  They are giving her iron which is making her constipated as well as the pain meds that are making her constipated  She knows that you typically have her get blood when it gets low like that.  The patient is scared and very uncomfortable. The doctor states he never saw anything stating she needed blood after the surgery.  Her daughter is not sure if Dr. Glori Bickers put in an order for her mom to get blood after surgery.       7.2 hemoglobin this morning

## 2013-04-06 NOTE — Telephone Encounter (Signed)
Spoke with the nurses at the nurse station where pt is at and they said they couldn't give me any information because of HIPPA, I advise them I didn't want any information and explained this phone note to her, she then said that has nothing to do with them and I need to call pt's orthopedic doctor.   I called [facility] and let the Triage nurse know the situation and asked that Dr. Alvan Dame look at this phone note in [facility] and requested that pt have a blood transfusion. Triage nurse said she will send Dr. Alvan Dame a email  I also sent this phone note to Dr. Alvan Dame

## 2013-04-06 NOTE — Care Management Note (Addendum)
    Page 1 of 2   04/07/2013     2:12:46 PM   CARE MANAGEMENT NOTE 04/07/2013  Patient:  [name] [name], [name] [name]   Account Number:  [id number]  Date Initiated:  04/05/2013  Documentation initiated by:  Sherrin Daisy  Subjective/Objective Assessment:   DX RT HIP OSTEOARTHRITIS; TOTAL HIP REPLACEMNT-ANTERIOR APPROACH RIGHT     Action/Plan:   CM  spoke with patient. Plans are for patient to return to her home in [place] [facility] where spouse and grandson will be caregivers. She will need RW. Requesting same physical therapist that she had several yrs ago for [facility] therapy.   Anticipated DC Date:  04/06/2013   Anticipated DC Plan:  [place]  CM consult      PAC Choice  [place]   Choice offered to / List presented to:  C-1 Patient   DME arranged  Vassie Moselle      DME agency  [place] arranged  [place]   Status of service:  Completed, signed off Medicare Important Message given?  NA - LOS <3 / Initial given by admissions (If response is "NO", the following Medicare IM given date fields will be blank) Date Medicare IM given:   Date Additional Medicare IM given:    Discharge Disposition:  [place]  Per UR Regulation:  Reviewed for med. necessity/level of care/duration of stay  If discussed at [place] of Stay Meetings, dates discussed:    Comments:  04/07/2013 Sherrin Daisy BSN RN CCM [phone] [facility] will provide HHpt services with 48hrs of discharge.

## 2013-04-06 NOTE — Progress Notes (Signed)
Physical Therapy Treatment Patient Details Name: [name] [name] MRN: [id number] DOB: December 14, 1940 Today's Date: 04/06/2013 Time: [id number] PT Time Calculation (min): 27 min  PT Assessment / Plan / Recommendation  History of Present Illness     PT Comments   Pt feeling better than am but fatigued and moving slowly.  Follow Up Recommendations  Home health PT     Does the patient have the potential to tolerate intense rehabilitation     Barriers to Discharge        Equipment Recommendations  Rolling walker with 5" wheels    Recommendations for Other Services OT consult  Frequency 7X/week   Progress towards PT Goals Progress towards PT goals: Progressing toward goals  Plan Current plan remains appropriate    Precautions / Restrictions Precautions Precautions: Fall Restrictions Weight Bearing Restrictions: No Other Position/Activity Restrictions: WBAT   Pertinent Vitals/Pain 4/10; premed, ice packs provided    Mobility  Bed Mobility Bed Mobility: Sit to Supine Sit to Supine: 4: Min assist Details for Bed Mobility Assistance: cues for sequence and use of L LE to self assist Transfers Transfers: Sit to Stand;Stand to Sit Sit to Stand: 4: Min assist Stand to Sit: 4: Min assist Details for Transfer Assistance: cues for LE management and use of UEs to self assist Ambulation/Gait Ambulation/Gait Assistance: 4: Min assist Ambulation Distance (Feet): 44 Feet (twice) Assistive device: Rolling walker Ambulation/Gait Assistance Details: cues for initial sequence, posture and position from RW Gait Pattern: Step-to pattern;Step-through pattern    Exercises     PT Diagnosis:    PT Problem List:   PT Treatment Interventions:     PT Goals (current goals can now be found in the care plan section) Acute Rehab PT Goals Patient Stated Goal: Resume previous lifestyle with decreased pain PT Goal Formulation: With patient Time For Goal Achievement: 04/12/13 Potential to Achieve  Goals: Good  Visit Information  Last PT Received On: 04/06/13 Assistance Needed: +1    Subjective Data  Subjective: I feel a little better but still tired Patient Stated Goal: Resume previous lifestyle with decreased pain   Cognition  Cognition Arousal/Alertness: Awake/alert Behavior During Therapy: WFL for tasks assessed/performed Overall Cognitive Status: Within Functional Limits for tasks assessed    Balance     End of Session PT - End of Session Equipment Utilized During Treatment: Gait belt Activity Tolerance: Patient tolerated treatment well Patient left: in bed;with call bell/phone within reach Nurse Communication: Mobility status   GP     Christoph Copelan 04/06/2013, 4:44 PM

## 2013-04-06 NOTE — Telephone Encounter (Signed)
I cannot do hospital orders -  Pt has hx of anemia (sees hematology) and she usually does get transfused with large joint surgeries -- the iron will not usually do the trick (orthopedic docs can look at epic and see her hematology notes)  I am out of the office today - please send copy of these notes to her nurses station today so they can go to the attn of the orthopedist when they round please  thanks

## 2013-04-06 NOTE — Progress Notes (Signed)
OT Cancellation Note  Patient Details Name: [name] [name] MRN: [id number] DOB: Jul 29, 1941   Cancelled Treatment:    Reason Eval/Treat Not Completed: Medical issues which prohibited therapy  Blood pending.  Will check back if schedule permits.    [name] 04/06/2013, 11:51 AM Lesle Chris, OTR/L [phone] 04/06/2013

## 2013-04-07 LAB — BASIC METABOLIC PANEL
BUN: 7 mg/dL (ref 6–23)
Chloride: 95 mEq/L — ABNORMAL LOW (ref 96–112)
GFR calc Af Amer: >90 mL/min (ref >90)
Glucose, Bld: 99 mg/dL (ref 70–99)
Potassium: 3.9 mEq/L (ref 3.5–5.1)

## 2013-04-07 LAB — TYPE AND SCREEN: Unit division: 0

## 2013-04-07 LAB — CBC
HCT: 33.2 % — ABNORMAL LOW (ref 36.0–46.0)
Hemoglobin: 11.3 g/dL — ABNORMAL LOW (ref 12.0–15.0)
WBC: 10.5 10*3/uL (ref 4.0–10.5)

## 2013-04-07 NOTE — Progress Notes (Addendum)
   Subjective: 3 Days Post-Op Procedure(s) (LRB): RIGHT TOTAL HIP ARTHROPLASTY ANTERIOR APPROACH (Right)   Patient reports pain as mild, pain well controlled. Do to the blood taking longer to administer she stayed throughout the night.  No events throughout the night. Ready to be discharged home if labs are good after receiving blood yesterday.  Objective:   VITALS:   Filed Vitals:   04/07/13 0447  BP: 150/73  Pulse: 88  Temp: 98.4 F (36.9 C)  Resp: 16    Neurovascular intact Dorsiflexion/Plantar flexion intact Incision: dressing C/D/I No cellulitis present Compartment soft  LABS  Recent Labs  04/05/13 0410 04/06/13 0415 04/07/13 0820  HGB 8.1* 7.4* 11.3*  HCT 24.4* 22.4* 33.2*  WBC 11.8* 10.1 10.5  PLT 194 172 208     Recent Labs  04/05/13 0410 04/06/13 0415 04/07/13 0820  NA 134* 132* 132*  K 3.9 3.4* 3.9  BUN 9 7 7   CREATININE 0.77 0.78 0.79  GLUCOSE 162* 107* 99     Assessment/Plan: 3 Days Post-Op Procedure(s) (LRB): RIGHT TOTAL HIP ARTHROPLASTY ANTERIOR APPROACH (Right) Up with therapy Discharge home with home health Follow up in 2 weeks at [facility]. Follow up with OLIN,Beola Vasallo D in 2 weeks.  Contact information:  [facility] [address] [place] [address] [phone]    ABLA  Treated with 2 units of blood yesterday, will check labs this morning.   Obese (BMI 30-39.9)  Estimated body mass index is 31.91 kg/(m^2) as calculated from the following:      Height as of this encounter: 5\' 4"  (1.626 m).      Weight as of this encounter: 84.369 kg (186 lb).  Patient also counseled that weight may inhibit the healing process  Patient counseled that losing weight will help with future health issues   West Pugh. Domitila Stetler   PAC  04/07/2013, 10:42 AM

## 2013-04-07 NOTE — Progress Notes (Signed)
Physical Therapy Treatment Patient Details Name: [name] [name] MRN: [id number] DOB: 12-07-1940 Today's Date: 04/07/2013 Time: [id number] PT Time Calculation (min): 33 min  PT Assessment / Plan / Recommendation  History of Present Illness admitted for R direct anterior tha (R)   PT Comments   Pt agreeable to participate with PT but reports extreme fatigue "I hope I don't just fall out"  Follow Up Recommendations  Home health PT     Does the patient have the potential to tolerate intense rehabilitation     Barriers to Discharge        Equipment Recommendations  Rolling walker with 5" wheels    Recommendations for Other Services OT consult  Frequency 7X/week   Progress towards PT Goals Progress towards PT goals: Progressing toward goals  Plan Current plan remains appropriate    Precautions / Restrictions Precautions Precautions: Fall Restrictions Weight Bearing Restrictions: No Other Position/Activity Restrictions: WBAT   Pertinent Vitals/Pain 5-6/10; ice packs provided, RN providing pain meds    Mobility  Bed Mobility Bed Mobility:  (Pt declined bed) Transfers Transfers: Sit to Stand;Stand to Sit Sit to Stand: 4: Min guard;From chair/3-in-1;With upper extremity assist;With armrests Stand to Sit: 4: Min guard;With upper extremity assist;To chair/3-in-1 Details for Transfer Assistance: cues for LE management and use of UEs on armrests to self assist Ambulation/Gait Ambulation/Gait Assistance: 4: Min guard Ambulation Distance (Feet): 150 Feet Assistive device: Rolling walker Ambulation/Gait Assistance Details: min cues for posture, sequence and stride length Gait Pattern: Step-to pattern;Step-through pattern Stairs: Yes Stairs Assistance: 4: Min assist Stairs Assistance Details (indicate cue type and reason): min cues for sequence Stair Management Technique: Two rails;Forwards Number of Stairs: 2    Exercises     PT Diagnosis:    PT Problem List:   PT Treatment  Interventions:     PT Goals (current goals can now be found in the care plan section) Acute Rehab PT Goals Patient Stated Goal: Resume previous lifestyle with decreased pain PT Goal Formulation: With patient Time For Goal Achievement: 04/12/13 Potential to Achieve Goals: Good  Visit Information  Last PT Received On: 04/07/13 Assistance Needed: +1 History of Present Illness: admitted for R direct anterior tha (R)    Subjective Data  Subjective: I'm just so tired Patient Stated Goal: Resume previous lifestyle with decreased pain   Cognition  Cognition Arousal/Alertness: Awake/alert Behavior During Therapy: WFL for tasks assessed/performed Overall Cognitive Status: Within Functional Limits for tasks assessed    Balance     End of Session PT - End of Session Equipment Utilized During Treatment: Gait belt Activity Tolerance: Patient tolerated treatment well Patient left: in chair;with call bell/phone within reach Nurse Communication: Mobility status   GP     [name] [name] 04/07/2013, 12:27 PM

## 2013-04-07 NOTE — Evaluation (Signed)
Occupational Therapy Evaluation Patient Details Name: [name] [name] MRN: [id number] DOB: [name] 27, 1941 Today's Date: 04/07/2013 Time: [id number] OT Time Calculation (min): 25 min  OT Assessment / Plan / Recommendation History of present illness admitted for R direct anterior tha    Clinical Impression   Pt was admitted for the above.  All education was completed.  Pt does not need any further OT    OT Assessment  Patient does not need any further OT services    Follow Up Recommendations  No OT follow up    Barriers to Discharge      Equipment Recommendations  3 in 1 bedside comode (or bucket for existing 3:1)    Recommendations for Other Services    Frequency       Precautions / Restrictions Precautions Precautions: Fall Restrictions Other Position/Activity Restrictions: WBAT   Pertinent Vitals/Pain Soreness only RLE    ADL  Grooming: Wash/dry hands;Wash/dry face;Teeth care;Set up Where Assessed - Grooming: Supported sitting Upper Body Bathing: Set up Where Assessed - Upper Body Bathing: Supported sitting Lower Body Bathing: Minimal assistance Where Assessed - Lower Body Bathing: Supported sit to stand Upper Body Dressing: Set up Where Assessed - Upper Body Dressing: Supported sitting Lower Body Dressing: Maximal assistance Where Assessed - Lower Body Dressing: Supported sit to Lobbyist: Magazine features editor Method: Arts development officer: Therapist, occupational and Hygiene: Minimal assistance Where Assessed - Best boy and Hygiene: Sit to stand from 3-in-1 or toilet Equipment Used: Rolling walker Transfers/Ambulation Related to ADLs: spt only to 3:1.  Pt has urgency ADL Comments: Pt is familiar with AE from previous sx and has reacher at home.  Will have family help.  She has a tub bench and doesn't need to practice this.      OT Diagnosis:    OT Problem List:   OT Treatment  Interventions:     OT Goals(Current goals can be found in the care plan section)    Visit Information  Last OT Received On: 04/07/13 Assistance Needed: +1 History of Present Illness: admitted for R direct anterior tha (R)       Prior Fl[place]agan expects to be discharged to:: Private residence Living Arrangements: Spouse/significant other;Children Home Equipment: Blain - single point Additional Comments: 3:1 is missing bucket; pt needs this Prior Function Level of Independence: Needs assistance (LB adls (has AE but usually had help)) Communication Communication: No difficulties Dominant Hand: Right         Vision/Perception     Cognition  Cognition Arousal/Alertness: Awake/alert Behavior During Therapy: WFL for tasks assessed/performed Overall Cognitive Status: Within Functional Limits for tasks assessed    Extremity/Trunk Assessment Upper Extremity Assessment Upper Extremity Assessment: Overall WFL for tasks assessed     Mobility Bed Mobility Sit to Supine: 4: Min assist;HOB elevated;With rail Details for Bed Mobility Assistance: cues for using arms to come forward Transfers Sit to Stand: 4: Min guard;From bed;From chair/3-in-1;With upper extremity assist Details for Transfer Assistance: cues for LE management     Exercise     Balance     End of Session OT - End of Session Activity Tolerance: Patient tolerated treatment well Patient left: in chair;with call bell/phone within reach (lab person present)  GO     [name] [name] 04/07/2013, 8:24 AM Lesle Chris, OTR/L [phone] 04/07/2013

## 2013-04-10 ENCOUNTER — Telehealth: Payer: Self-pay | Admitting: Family Medicine

## 2013-04-10 NOTE — Discharge Summary (Signed)
Physician Discharge Summary  Patient ID: REANN CURTAIN MRN: [id number] DOB/AGE: 73/01/1941 73 y.o.  Admit date: 04/04/2013 Discharge date: 04/07/2013   Procedures:  Procedure(s) (LRB): RIGHT TOTAL HIP ARTHROPLASTY ANTERIOR APPROACH (Right)  Attending Physician:  Dr. Paralee Cancel   Admission Diagnoses:   Right hip OA / pain  Discharge Diagnoses:  Principal Problem:   S/P right THA, AA Active Problems:   Expected blood loss anemia   Obese  Past Medical History  Diagnosis Date  . Hx of skin cancer, basal cell 2013 and 2012  . Psoriasis   . Depression   . Anxiety   . Hypertension   . Arthritis     osteoarthritis  . Peptic ulcer disease     H pylori  . Fibrocystic breast   . Osteopenia   . Anemia     of chronic disease  . Complication of anesthesia 1980 and 1982    trouble waking up after breast biopsy    HPI: [name] [name], 72 y.o. female, has a history of pain and functional disability in the right hip(s) due to arthritis and patient has failed non-surgical conservative treatments for greater than 12 weeks to include NSAID's and/or analgesics, use of assistive devices and activity modification. Onset of symptoms was gradual starting 2.5 years ago with gradually worsening course since that time.The patient noted no past surgery on the right hip(s). Patient currently rates pain in the right hip at 10 out of 10 with activity. Patient has worsening of pain with activity and weight bearing, trendelenberg gait, pain that interfers with activities of daily living and pain with passive range of motion. Patient has evidence of periarticular osteophytes and joint space narrowing by imaging studies. This condition presents safety issues increasing the risk of falls. There is no current signs of infection. Risks, benefits and expectations were discussed with the patient. Patient understand the risks, benefits and expectations and wishes to proceed with surgery.  PCP: [name], MD     Discharged Condition: good  Hospital Course:  Patient underwent the above stated procedure on 04/04/2013. Patient tolerated the procedure well and brought to the recovery room in good condition and subsequently to the floor.  POD #1 BP: 118/61 ; Pulse: 93 ; Temp: 98.5 F (36.9 C) ; Resp: 18  Pt's foley was removed, as well as the hemovac drain removed. IV was changed to a saline lock. Patient reports pain as mild, pain well controlled. No events throughout the night. Neurovascular intact, dorsiflexion/plantar flexion intact, incision: dressing C/D/I, no cellulitis present and compartment soft.   LABS  Basename    HGB  8.1  HCT  24.4   POD #2  BP: 130/74 ; Pulse: 89 ; Temp: 98.8 F (37.1 C) ; Resp: 17 Patient reports pain as mild, pain well controlled. No events throughout the night. A little dizzy when getting up this morning. States that she has a history anemia and says that she doesn't recover her H&H quickly.  We have discussed blood transfusion do to symptomatic anemia and will proceed. Neurovascular intact, dorsiflexion/plantar flexion intact, incision: dressing C/D/I, no cellulitis present and compartment soft.   LABS  Basename    HGB  7.4  HCT  22.4   POD #3  BP: 150/73 ; Pulse: 88 ; Temp: 98.4 F (36.9 C) ; Resp: 16  Patient reports pain as mild, pain well controlled. She states that she feels better after receiving the blood.  No other events throughout the night. Ready to  be discharged home since symptoms resolved and labs are closer to normal. Neurovascular intact, dorsiflexion/plantar flexion intact, incision: dressing C/D/I, no cellulitis present and compartment soft.   LABS  Basename    HGB  11.3  HCT  33.2    Discharge Exam: General appearance: alert, cooperative and no distress Extremities: Homans sign is negative, no sign of DVT, no edema, redness or tenderness in the calves or thighs and no ulcers, gangrene or trophic changes  Disposition:   Home-Health  Care Svc with follow up in 2 weeks   Follow-up Information   Follow up with Mauri Pole, MD. Schedule an appointment as soon as possible for a visit in 2 weeks.   Specialty:  Orthopedic Surgery   Contact information:   [address] [place] [address] [phone]       Discharge Orders   Future Appointments Provider Department Dept Phone   05/19/2013 10:00 AM [facility] [place] [id number]   05/19/2013 10:30 AM Deatra Robinson, MD [place] [phone]   06/19/2013 9:00 AM [facility] [place] [facility] [place] [phone]   06/28/2013 12:00 PM Abner Greenspan, MD [place] [facility] [place]   Future Orders Complete By Expires   Call MD / Call 911  As directed    Comments:     If you experience chest pain or shortness of breath, CALL 911 and be transported to the hospital emergency room.  If you develope a fever above 101 F, pus (white drainage) or increased drainage or redness at the wound, or calf pain, call your surgeon's office.   Change dressing  As directed    Comments:     Maintain surgical dressing for 10-14 days, then replace with 4x4 guaze and tape. Keep the area dry and clean.   Constipation Prevention  As directed    Comments:     Drink plenty of fluids.  Prune juice may be helpful.  You may use a stool softener, such as Colace (over the counter) 100 mg twice a day.  Use MiraLax (over the counter) for constipation as needed.   Diet - low sodium heart healthy  As directed    Discharge instructions  As directed    Comments:     Maintain surgical dressing for 10-14 days, then replace with gauze and tape. Keep the area dry and clean until follow up. Follow up in 2 weeks at [facility]. Call with any questions or concerns.   Increase activity slowly as tolerated  As directed    TED hose  As directed    Comments:     Use stockings (TED  hose) for 2 weeks on both leg(s).  You may remove them at night for sleeping.   Weight bearing as tolerated  As directed         Medication List    STOP taking these medications       aspirin 325 MG tablet  Replaced by:  aspirin 325 MG EC tablet     multivitamins with iron Tabs tablet     mupirocin nasal ointment 2 %  Commonly known as:  BACTROBAN NASAL     traMADol 50 MG tablet  Commonly known as:  ULTRAM     TYLENOL ARTHRITIS PAIN 650 MG CR tablet  Generic drug:  acetaminophen      TAKE these medications       aspirin 325 MG EC tablet  Take 1 tablet (325 mg total) by mouth 2 (two) times daily.     atorvastatin 10 MG tablet  Commonly known as:  LIPITOR  Take 10 mg by mouth every evening.     busPIRone 15 MG tablet  Commonly known as:  BUSPAR  Take 7.5 mg by mouth 2 (two) times daily.     CALTRATE 600 PLUS-VIT D PO  Take 1 tablet by mouth 2 (two) times daily.     DSS 100 MG Caps  Take 100 mg by mouth 2 (two) times daily.     esomeprazole 40 MG capsule  Commonly known as:  NEXIUM  Take 40 mg by mouth daily before breakfast.     ferrous sulfate 325 (65 FE) MG tablet  Take 1 tablet (325 mg total) by mouth 3 (three) times daily after meals.     HYDROcodone-acetaminophen 7.5-325 MG per tablet  Commonly known as:  NORCO  Take 1-2 tablets by mouth every 4 (four) hours as needed for pain.     hyoscyamine 0.125 MG tablet  Commonly known as:  LEVSIN, ANASPAZ  Take 0.125 mg by mouth every 6 (six) hours as needed for cramping.     losartan-hydrochlorothiazide 100-25 MG per tablet  Commonly known as:  HYZAAR  Take 1 tablet by mouth every morning.     oxymetazoline 0.05 % nasal spray  Commonly known as:  AFRIN  Place 2 sprays into the nose 2 (two) times daily as needed for congestion.     polyethylene glycol packet  Commonly known as:  MIRALAX / GLYCOLAX  Take 17 g by mouth 2 (two) times daily.     tiZANidine 4 MG capsule  Commonly known as:  ZANAFLEX  Take  1 capsule (4 mg total) by mouth 3 (three) times daily as needed for muscle spasms.         Signed: [place]. Maron Stanzione   PAC  04/10/2013, 7:06 AM

## 2013-04-10 NOTE — Telephone Encounter (Signed)
Patient's daughter (okay to talk to her per designated party release) would like for Dr. Glori Bickers to call her regarding her mom's health.  [id number] Home [phone] Cell

## 2013-04-10 NOTE — Telephone Encounter (Signed)
I spoke to her about her mother (youngest daughter Deloris Ping) We disc likelyhood of depression-pt is very irritable/ snappy/ unmotivated/ negative and tearful (no indication of SI however) She thinks this has gone on for a while but she hides it well  Is recovering from orthopedic surgery currently - and doubts she will mention it to me  Made plan for she or her sister to bring her in for a visit to discuss (or direct ref to counseling) They will update me and see me at the visit

## 2013-04-15 ENCOUNTER — Ambulatory Visit: Payer: Self-pay | Admitting: Internal Medicine

## 2013-04-15 DIAGNOSIS — Z0289 Encounter for other administrative examinations: Secondary | ICD-10-CM

## 2013-04-17 ENCOUNTER — Telehealth: Payer: Self-pay | Admitting: Family Medicine

## 2013-04-17 NOTE — Telephone Encounter (Signed)
Office Message [place], [place] [address] p. [phone] f. [phone] To: [facility] (After Hours Triage) Fax: [phone] From: [facility] Date/ Time: 04/15/2013 8:41 AM Taken By: Jeb Levering, CSR Caller: Spencer: not collected Patient: [name] [name], [name] [name] DOB: 1940-12-28 Phone: [phone] Reason for Call: See info below Regarding Appointment: Yes Appt Date: 04/15/2013 Appt Time: 9:45:00 AM Provider: [name] (Family Practice) Reason: Cancel Appointment Details: cancle appontment. Outcome: Instructed patient to call back tomorrow.

## 2013-04-17 NOTE — Telephone Encounter (Signed)
[facility] Triage Call Report Triage Record Num: [id number] Operator: Noemi Chapel Patient Name: [name] [name] Call Date & Time: 04/14/2013 9:38:06PM Patient Phone: [phone] PCP: [name]. [place] Patient Gender: Female PCP Fax : Patient DOB: May 05, 1941 Practice Name: [place] Reason for Call: Per Emergent Line: Caller: Merie/Patient; PCP: [name] ([facility]); CB#: [phone]; Call re vaginal bleeding ; Caller reports she just voided and after wiping herself, she noticed some bright red blood running down her legs onto the floor. She has cleaned herself and put on a Depends. No further bleeding at present. Caller denies pain of any type. Caller had a hip replacement on 8/12 and was seen in office today for post-op visit. Bandage was removed and MD was pleased with her progress. She is taking (2) 325mg  ASA q day. Per Vaginal Bleeding: No Menopausal Hormone Therapy, Any vaginal bleeding including spotting AND 1 year or more post menopausal, See Provider within 72 hours. Caller given home care with callback parameters and advised she should be seen in the office on Monday 8/25. EPIC record reviewed. Caller would like to be seen in the am. Appointment scheduled for Sat 8/23 at 9:45 with Dr Lubertha Sayres at [place] location. Caller is agreeable. Vaginal Bleeding: No Menopausal Hormone Therapy (MHT) Protocol(s) Used: Recommended Outcome per Protocol: See Provider within 19 Hours Reason for Outcome: Any vaginal bleeding including spotting AND 1 year or more post menopausal or had total hysterectomy Care Advice: ~ Call provider if symptoms worsen or new symptoms develop. 04/14/2013 10:01:01PM Page 1 of 1 [email or web address]

## 2013-04-17 NOTE — Telephone Encounter (Signed)
Please make sure she gets an appt this week if she can get here-thanks  (looks like she had a sat am appt and cancelled it)

## 2013-04-17 NOTE — Telephone Encounter (Signed)
Pt scheduled appt (13min) for Wednesday 8/27, pt thinks she had rectal bleeding not vaginal bleeding, but she canceled her sat. Clinic appt because the bleeding stopped. Pt wanted you to see her to discuss that rectal bleeding and a few more issues she has been having so I scheduled her a 53min appt on Wed

## 2013-04-19 ENCOUNTER — Encounter: Payer: Self-pay | Admitting: Family Medicine

## 2013-04-19 ENCOUNTER — Ambulatory Visit (INDEPENDENT_AMBULATORY_CARE_PROVIDER_SITE_OTHER): Payer: Medicare Other | Admitting: Family Medicine

## 2013-04-19 VITALS — BP 124/64 | HR 87 | Temp 98.5°F | Ht 64.25 in | Wt 182.0 lb

## 2013-04-19 DIAGNOSIS — L538 Other specified erythematous conditions: Secondary | ICD-10-CM

## 2013-04-19 DIAGNOSIS — K625 Hemorrhage of anus and rectum: Secondary | ICD-10-CM | POA: Insufficient documentation

## 2013-04-19 DIAGNOSIS — L989 Disorder of the skin and subcutaneous tissue, unspecified: Secondary | ICD-10-CM

## 2013-04-19 DIAGNOSIS — L304 Erythema intertrigo: Secondary | ICD-10-CM | POA: Insufficient documentation

## 2013-04-19 MED ORDER — CLOTRIMAZOLE-BETAMETHASONE 1-0.05 % EX CREA: TOPICAL_CREAM | Freq: Two times a day (BID) | CUTANEOUS | Status: DC

## 2013-04-19 NOTE — Progress Notes (Signed)
Subjective:    Patient ID: [name] [name], female    DOB: 10-Nov-1940, 72 y.o.   MRN: [id number]  HPI Here for several issues   Had a recent hip replacement   Anemia - had blood transfusion with surgery- hb 11.3 at discharge  That is much better   She thinks she had some rectal bleeding last friday After going to the bathroom - she noticed some bright red blood -she thinks she had a hemorrhoid (has had some before) It stopped  Had just had surgery and also had pain med  That am she had urgency to have a bm - with cold sweat and stomach cramping -and she ended up having a small bm  Saw ortho that day- felt a little weak all day long   Has some rash/ chafing like rash under arm L and also in groin area  She used some bactroban ointment and it is improved  Some itching but improved   Has a spot on inner R ankle - a little red  Was worried about infection  Not painful    Patient Active Problem List   Diagnosis Date Noted  . Intertrigo 04/19/2013  . Skin lesion of right leg 04/19/2013  . Rectal bleeding 04/19/2013  . Expected blood loss anemia 04/05/2013  . Obese 04/05/2013  . 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 Preoperative examination 03/29/2013  . Acute bacterial sinusitis 03/06/2013  . Conjunctivitis 03/06/2013  . IBS (irritable bowel syndrome) 05/30/2012  . ANEMIA OF CHRONIC DISEASE 04/16/2010  . ANXIETY 04/16/2010  . MAMMOGRAM, ABNORMAL 02/28/2010  . DYSPHAGIA 03/07/2009  . ESOPHAGEAL STRICTURE 03/04/2009  . GERD 03/04/2009  . UNSPECIFIED VITAMIN D DEFICIENCY 03/12/2008  . URINARY TRACT INFECTION, RECURRENT 08/07/2007  . OSTEOPENIA 02/21/2007  . FIBROCYSTIC BREAST DISEASE, HX OF 02/11/2007  . HYPERCHOLESTEROLEMIA WITH HIGH HDL 01/10/2007  . DEPRESSION 01/10/2007  . RETINAL VEIN OCCLUSION 01/10/2007  . HYPERTENSION 01/10/2007  . CAD 01/10/2007  . PEPTIC ULCER DISEASE 01/10/2007  . GALLSTONE PANCREATITIS 01/10/2007  . OSTEOARTHRITIS 01/10/2007  . SKIN CANCER, HX OF  01/10/2007   Past Medical History  Diagnosis Date  . Hx of skin cancer, basal cell 2013 and 2012  . Psoriasis   . Depression   . Anxiety   . Hypertension   . Arthritis     osteoarthritis  . Peptic ulcer disease     H pylori  . Fibrocystic breast   . Osteopenia   . Anemia     of chronic disease  . Complication of anesthesia 1980 and 1982    trouble waking up after breast biopsy   Past Surgical History  Procedure Laterality Date  . Knee surgery      x3  . Nasal sinus surgery    . Esophagogastroduodenoscopy  03/2009    ulcer,HP, GERD stricture  . Spine surgery      back surgery x 3  . Spine surgery  2005    deg. disk lumber spine   . Breast surgery    . Abdominal hysterectomy      partial in 1970s  . Cholecystectomy  2005  . Hip arthroplasty Left 2010  . Joint replacement Right 2009    knee  . Breast biopsy Left 1980 and 1982  . Total hip arthroplasty Right 04/04/2013    Procedure: RIGHT TOTAL HIP ARTHROPLASTY ANTERIOR APPROACH;  Surgeon: Mauri Pole, MD;  Location: [facility];  Service: Orthopedics;  Laterality: Right;   History  Substance Use Topics  .  Smoking status: Never Smoker   . Smokeless tobacco: Never Used  . Alcohol Use: Yes     Comment: occasional   Family History  Problem Relation Age of Onset  . Arthritis Mother   . Emphysema Mother   . Hyperlipidemia Mother   . Hypertension Mother   . Heart disease Brother     CAD   Allergies  Allergen Reactions  . Ace Inhibitors     REACTION: cough  . Amoxicillin-Pot Clavulanate     REACTION: gi upset  . Oxaprozin     REACTION: unknown  . Pregabalin     REACTION: swelling  . Sulfonamide Derivatives     REACTION: UNKNOWN   Current Outpatient Prescriptions on File Prior to Visit  Medication Sig Dispense Refill  . aspirin EC 325 MG EC tablet Take 1 tablet (325 mg total) by mouth 2 (two) times daily.  60 tablet  0  . atorvastatin (LIPITOR) 10 MG tablet Take 10 mg by mouth every evening.      .  busPIRone (BUSPAR) 15 MG tablet Take 7.5 mg by mouth 2 (two) times daily.      . Calcium-Vitamin D (CALTRATE 600 PLUS-VIT D PO) Take 1 tablet by mouth 2 (two) times daily.      Marland Kitchen docusate sodium 100 MG CAPS Take 100 mg by mouth 2 (two) times daily.  10 capsule  0  . esomeprazole (NEXIUM) 40 MG capsule Take 40 mg by mouth daily before breakfast.      . ferrous sulfate 325 (65 FE) MG tablet Take 1 tablet (325 mg total) by mouth 3 (three) times daily after meals.    3  . HYDROcodone-acetaminophen (NORCO) 7.5-325 MG per tablet Take 1-2 tablets by mouth every 4 (four) hours as needed for pain.  120 tablet  0  . hyoscyamine (LEVSIN, ANASPAZ) 0.125 MG tablet Take 0.125 mg by mouth every 6 (six) hours as needed for cramping.      Marland Kitchen losartan-hydrochlorothiazide (HYZAAR) 100-25 MG per tablet Take 1 tablet by mouth every morning.      Marland Kitchen oxymetazoline (AFRIN) 0.05 % nasal spray Place 2 sprays into the nose 2 (two) times daily as needed for congestion.      . polyethylene glycol (MIRALAX / GLYCOLAX) packet Take 17 g by mouth 2 (two) times daily.  14 each  0  . tiZANidine (ZANAFLEX) 4 MG capsule Take 1 capsule (4 mg total) by mouth 3 (three) times daily as needed for muscle spasms.  50 capsule  0   No current facility-administered medications on file prior to visit.    Review of Systems Review of Systems  Constitutional: Negative for fever, appetite change,  and unexpected weight change. pos for fatigue that is improving  Eyes: Negative for pain and visual disturbance.  Respiratory: Negative for cough and shortness of breath.   Cardiovascular: Negative for cp or palpitations    Gastrointestinal: Negative for nausea, diarrhea and constipation. pos for occ urgent bm and hemorrhoid issues while on pain med  Genitourinary: Negative for urgency and frequency.  Skin: Negative for pallor and pos for rash Neurological: Negative for weakness, light-headedness, numbness and headaches.  Hematological: Negative for  adenopathy. Does not bruise/bleed easily.  Psychiatric/Behavioral: Negative for dysphoric mood. The patient is not nervous/anxious.  (despite daughters earlier call about mood disorder-pt denies it)- disc stressors        Objective:   Physical Exam  Constitutional: She appears well-developed and well-nourished. No distress.  overwt and well app  HENT:  Head: Normocephalic and atraumatic.  Mouth/Throat: Oropharynx is clear and moist.  Eyes: Conjunctivae and EOM are normal. Pupils are equal, round, and reactive to light. No scleral icterus.  Neck: Normal range of motion. Neck supple. Carotid bruit is not present.  Cardiovascular: Normal rate, regular rhythm, normal heart sounds and intact distal pulses.  Exam reveals no gallop.   Pulmonary/Chest: Effort normal and breath sounds normal. No respiratory distress. She has no wheezes. She has no rales.  Abdominal: Soft. Bowel sounds are normal. She exhibits no distension, no abdominal bruit and no mass. There is no tenderness.  Musculoskeletal: She exhibits no edema and no tenderness.  Small raised area/ pink on med R ankle consistent with a healing abrasion-no s of infection  Lymphadenopathy:    She has no cervical adenopathy.  Neurological: She is alert. She has normal reflexes. No cranial nerve deficit. She exhibits normal muscle tone. Coordination normal.  Skin: Skin is warm and dry. Rash noted. No erythema. No pallor.  Areas of erythema and scale with demarcated borders and satellite lesions under L arm and in groin- consistent with candidal intertrigo  Psychiatric: She has a normal mood and affect.          Assessment & Plan:

## 2013-04-19 NOTE — Patient Instructions (Addendum)
Use the generic lotrisone cream on rash areas twice daily until better  Try not to scratch and keep cool If bleeding returns please let me know Spot on ankle looks ok - I do not think you have cellulitis- but if it gets worse, inform me (it is ok to use antibiotic ointment on it) Take care of yourself

## 2013-04-20 NOTE — Assessment & Plan Note (Signed)
Looks like a healing abrasion -no ulceration  No signs of infection  Will watch for redness or swelling bactroban prn

## 2013-04-20 NOTE — Assessment & Plan Note (Signed)
brb after straining for bm- with pain med s/p hip surgery Strongly suspec hemorrhoids Disc use of stool softeners or miralax to minimize constipaton Will update if this re occurs

## 2013-04-20 NOTE — Assessment & Plan Note (Signed)
Suspect candidal, also with itching Trial of lotrisone Update if not starting to improve in a week or if worsening

## 2013-05-19 ENCOUNTER — Other Ambulatory Visit (HOSPITAL_BASED_OUTPATIENT_CLINIC_OR_DEPARTMENT_OTHER): Payer: Medicare Other | Admitting: Lab

## 2013-05-19 ENCOUNTER — Telehealth: Payer: Self-pay | Admitting: Oncology

## 2013-05-19 ENCOUNTER — Encounter: Payer: Self-pay | Admitting: Oncology

## 2013-05-19 ENCOUNTER — Ambulatory Visit (HOSPITAL_BASED_OUTPATIENT_CLINIC_OR_DEPARTMENT_OTHER): Payer: Medicare Other | Admitting: Oncology

## 2013-05-19 VITALS — BP 137/74 | HR 87 | Temp 98.0°F | Resp 20 | Ht 64.25 in | Wt 183.0 lb

## 2013-05-19 DIAGNOSIS — D638 Anemia in other chronic diseases classified elsewhere: Secondary | ICD-10-CM

## 2013-05-19 LAB — CBC WITH DIFFERENTIAL/PLATELET
Basophils Absolute: 0.1 10*3/uL (ref 0.0–0.1)
EOS%: 2.7 % (ref 0.0–7.0)
Eosinophils Absolute: 0.2 10*3/uL (ref 0.0–0.5)
HCT: 31.2 % — ABNORMAL LOW (ref 34.8–46.6)
HGB: 10.3 g/dL — ABNORMAL LOW (ref 11.6–15.9)
MCH: 28.5 pg (ref 25.1–34.0)
MCV: 86.3 fL (ref 79.5–101.0)
NEUT%: 65.2 % (ref 38.4–76.8)
lymph#: 1.4 10*3/uL (ref 0.9–3.3)

## 2013-05-19 LAB — IRON AND TIBC CHCC
%SAT: 15 % — ABNORMAL LOW (ref 21–57)
TIBC: 255 ug/dL (ref 236–444)

## 2013-05-19 NOTE — Patient Instructions (Addendum)
I will see you back in 6 months  We will check iron studies in 6 months and determine if you require procrit  Epoetin Alfa injection What is this medicine? EPOETIN ALFA (e POE e tin AL fa) helps your body make more red blood cells. This medicine is used to treat anemia caused by chronic kidney failure, cancer chemotherapy, or HIV-therapy. It may also be used before surgery if you have anemia. This medicine may be used for other purposes; ask your health care provider or pharmacist if you have questions. What should I tell my health care provider before I take this medicine? They need to know if you have any of these conditions: -blood clotting disorders -cancer patient not on chemotherapy -cystic fibrosis -heart disease, such as angina or heart failure -hemoglobin level of 12 g/dL or greater -high blood pressure -low levels of folate, iron, or vitamin B12 -seizures -an unusual or allergic reaction to erythropoietin, albumin, benzyl alcohol, hamster proteins, other medicines, foods, dyes, or preservatives -pregnant or trying to get pregnant -breast-feeding How should I use this medicine? This medicine is for injection into a vein or under the skin. It is usually given by a health care professional in a hospital or clinic setting. If you get this medicine at home, you will be taught how to prepare and give this medicine. Use exactly as directed. Take your medicine at regular intervals. Do not take your medicine more often than directed. It is important that you put your used needles and syringes in a special sharps container. Do not put them in a trash can. If you do not have a sharps container, call your pharmacist or healthcare provider to get one. Talk to your pediatrician regarding the use of this medicine in children. While this drug may be prescribed for selected conditions, precautions do apply. Overdosage: If you think you have taken too much of this medicine contact a poison control  center or emergency room at once. NOTE: This medicine is only for you. Do not share this medicine with others. What if I miss a dose? If you miss a dose, take it as soon as you can. If it is almost time for your next dose, take only that dose. Do not take double or extra doses. What may interact with this medicine? Do not take this medicine with any of the following medications: -darbepoetin alfa This list may not describe all possible interactions. Give your health care provider a list of all the medicines, herbs, non-prescription drugs, or dietary supplements you use. Also tell them if you smoke, drink alcohol, or use illegal drugs. Some items may interact with your medicine. What should I watch for while using this medicine? Visit your prescriber or health care professional for regular checks on your progress and for the needed blood tests and blood pressure measurements. It is especially important for the doctor to make sure your hemoglobin level is in the desired range, to limit the risk of potential side effects and to give you the best benefit. Keep all appointments for any recommended tests. Check your blood pressure as directed. Ask your doctor what your blood pressure should be and when you should contact him or her. As your body makes more red blood cells, you may need to take iron, folic acid, or vitamin B supplements. Ask your doctor or health care provider which products are right for you. If you have kidney disease continue dietary restrictions, even though this medication can make you feel better. Talk  with your doctor or health care professional about the foods you eat and the vitamins that you take. What side effects may I notice from receiving this medicine? Side effects that you should report to your doctor or health care professional as soon as possible: -allergic reactions like skin rash, itching or hives, swelling of the face, lips, or tongue -breathing problems -changes in  vision -chest pain -confusion, trouble speaking or understanding -feeling faint or lightheaded, falls -high blood pressure -muscle aches or pains -pain, swelling, warmth in the leg -rapid weight gain -severe headaches -sudden numbness or weakness of the face, arm or leg -trouble walking, dizziness, loss of balance or coordination -seizures (convulsions) -swelling of the ankles, feet, hands -unusually weak or tired Side effects that usually do not require medical attention (report to your doctor or health care professional if they continue or are bothersome): -diarrhea -fever, chills (flu-like symptoms) -headaches -nausea, vomiting -redness, stinging, or swelling at site where injected This list may not describe all possible side effects. Call your doctor for medical advice about side effects. You may report side effects to FDA at 1-800-FDA-1088. Where should I keep my medicine? Keep out of the reach of children. Store in a refrigerator between 2 and 8 degrees C (36 and 46 degrees F). Do not freeze or shake. Throw away any unused portion if using a single-dose vial. Multi-dose vials can be kept in the refrigerator for up to 21 days after the initial dose. Throw away unused medicine. NOTE: This sheet is a summary. It may not cover all possible information. If you have questions about this medicine, talk to your doctor, pharmacist, or health care provider.  2013, [employer]. (07/24/2008 10:25:44 AM)

## 2013-05-19 NOTE — Telephone Encounter (Signed)
, °

## 2013-05-31 NOTE — Progress Notes (Signed)
OFFICE PROGRESS NOTE  CC  [name], MD [facility] [address] [place] [address]  DIAGNOSIS: 72 year old female with anemia of chronic disease  PRIOR THERAPY:observation  CURRENT THERAPY:observation  INTERVAL HISTORY: [name] [name] 72 y.o. female returns for followup visit today.Clinically she seems to be doing well without any significant problems.Clinically patient also seems to be doing well without any significant complaints. She continues to have no evidence of fevers chills night sweats headaches shortness of breath chest pains palpitations she does have some achiness otherwise no other complaints and remainder of the 10 point review of systems is negative.  MEDICAL HISTORY: Past Medical History  Diagnosis Date  . Hx of skin cancer, basal cell 2013 and 2012  . Psoriasis   . Depression   . Anxiety   . Hypertension   . Arthritis     osteoarthritis  . Peptic ulcer disease     H pylori  . Fibrocystic breast   . Osteopenia   . Anemia     of chronic disease  . Complication of anesthesia 1980 and 1982    trouble waking up after breast biopsy    ALLERGIES:  is allergic to ace inhibitors; amoxicillin-pot clavulanate; oxaprozin; pregabalin; and sulfonamide derivatives.  MEDICATIONS:  Current Outpatient Prescriptions  Medication Sig Dispense Refill  . atorvastatin (LIPITOR) 10 MG tablet Take 10 mg by mouth every evening.      . busPIRone (BUSPAR) 15 MG tablet Take 7.5 mg by mouth 2 (two) times daily.      . Calcium-Vitamin D (CALTRATE 600 PLUS-VIT D PO) Take 1 tablet by mouth 2 (two) times daily.      . clotrimazole-betamethasone (LOTRISONE) cream Apply topically 2 (two) times daily. To affected areas until cleared  30 g  0  . docusate sodium 100 MG CAPS Take 100 mg by mouth 2 (two) times daily.  10 capsule  0  . esomeprazole (NEXIUM) 40 MG capsule Take 40 mg by mouth daily before breakfast.      . HYDROcodone-acetaminophen (NORCO) 7.5-325  MG per tablet Take 1-2 tablets by mouth every 4 (four) hours as needed for pain.  120 tablet  0  . hyoscyamine (LEVSIN, ANASPAZ) 0.125 MG tablet Take 0.125 mg by mouth every 6 (six) hours as needed for cramping.      Marland Kitchen losartan-hydrochlorothiazide (HYZAAR) 100-25 MG per tablet Take 1 tablet by mouth every morning.      Marland Kitchen oxymetazoline (AFRIN) 0.05 % nasal spray Place 2 sprays into the nose 2 (two) times daily as needed for congestion.      Marland Kitchen tiZANidine (ZANAFLEX) 4 MG capsule Take 1 capsule (4 mg total) by mouth 3 (three) times daily as needed for muscle spasms.  50 capsule  0   No current facility-administered medications for this visit.    SURGICAL HISTORY:  Past Surgical History  Procedure Laterality Date  . Knee surgery      x3  . Nasal sinus surgery    . Esophagogastroduodenoscopy  03/2009    ulcer,HP, GERD stricture  . Spine surgery      back surgery x 3  . Spine surgery  2005    deg. disk lumber spine   . Breast surgery    . Abdominal hysterectomy      partial in 1970s  . Cholecystectomy  2005  . Hip arthroplasty Left 2010  . Joint replacement Right 2009    knee  . Breast biopsy Left 1980 and  1982  . 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   PHYSICAL EXAMINATION: General appearance: alert, cooperative and appears stated age Head: Normocephalic, without obvious abnormality, atraumatic Neck: no adenopathy, no carotid bruit, no JVD, supple, symmetrical, trachea midline and thyroid not enlarged, symmetric, no tenderness/mass/nodules Lymph nodes: Cervical, supraclavicular, and axillary nodes normal. Resp: clear to auscultation bilaterally and normal percussion bilaterally Back: symmetric, no curvature. ROM normal. No CVA tenderness. Cardio: regular rate and rhythm,  S1, S2 normal, no murmur, click, rub or gallop GI: soft, non-tender; bowel sounds normal; no masses,  no organomegaly Extremities: extremities normal, atraumatic, no cyanosis or edema Neurologic: Alert and oriented X 3, normal strength and tone. Normal symmetric reflexes. Normal coordination and gait  ECOG PERFORMANCE STATUS: 1 - Symptomatic but completely ambulatory  Blood pressure 137/74, pulse 87, temperature 98 F (36.7 C), temperature source Oral, resp. rate 20, height 5' 4.25" (1.632 m), weight 183 lb (83.008 kg).  LABORATORY DATA: Lab Results  Component Value Date   WBC 6.1 05/19/2013   HGB 10.3* 05/19/2013   HCT 31.2* 05/19/2013   MCV 86.3 05/19/2013   PLT 247 05/19/2013     RADIOGRAPHIC STUDIES:  No results found.  ASSESSMENT: 72 year old female with anemia of chronic disease. Patient is on observation only as her hemoglobin has remained pretty stable and she is aymptomatic. There is no need to intervene with any kind of treatment at this point. She is encouraged to the exercise and adapt a healthy lifestyle and reduce stress   PLAN:  #1 I think overall patient is doing well her counts remained pretty stable. I have checked her iron studies today we will call her with the results. She has not required any treatments for me. Therefore my recommendation is for her to be seen on an as-needed basis with me or once a year. Certainly she should have her CBC performed every 6 months at her primary care physician's office.  All questions were answered. The patient knows to call the clinic with any problems, questions or concerns. We can certainly see the patient much sooner if necessary.  I spent 15 minutes counseling the patient face to face. The total time spent in the appointment was 30 minutes.    Marcy Panning, MD Medical/Oncology [facility] [phone] (beeper) [phone] (Office)  05/31/2013, 7:48 AM

## 2013-06-01 ENCOUNTER — Other Ambulatory Visit: Payer: Self-pay | Admitting: Family Medicine

## 2013-06-19 ENCOUNTER — Other Ambulatory Visit: Payer: Medicare Other

## 2013-06-23 ENCOUNTER — Encounter: Payer: Medicare Other | Admitting: Family Medicine

## 2013-06-25 ENCOUNTER — Other Ambulatory Visit: Payer: Self-pay | Admitting: Family Medicine

## 2013-06-25 DIAGNOSIS — E7800 Pure hypercholesterolemia, unspecified: Secondary | ICD-10-CM

## 2013-06-25 DIAGNOSIS — D638 Anemia in other chronic diseases classified elsewhere: Secondary | ICD-10-CM

## 2013-06-25 DIAGNOSIS — M899 Disorder of bone, unspecified: Secondary | ICD-10-CM

## 2013-06-25 DIAGNOSIS — I10 Essential (primary) hypertension: Secondary | ICD-10-CM

## 2013-06-25 DIAGNOSIS — E559 Vitamin D deficiency, unspecified: Secondary | ICD-10-CM

## 2013-06-25 NOTE — Telephone Encounter (Signed)
Message copied by Abner Greenspan on Sun Jun 25, 2013  5:56 PM ------      Message from: Ellamae Sia      Created: Wed Jun 21, 2013  3:00 PM      Regarding: Lab orders for Monday, 11.3.14       Patient is scheduled for CPX labs, please order future labs, Thanks , Terri       ------

## 2013-06-26 ENCOUNTER — Other Ambulatory Visit (INDEPENDENT_AMBULATORY_CARE_PROVIDER_SITE_OTHER): Payer: Medicare Other

## 2013-06-26 DIAGNOSIS — I10 Essential (primary) hypertension: Secondary | ICD-10-CM

## 2013-06-26 DIAGNOSIS — M899 Disorder of bone, unspecified: Secondary | ICD-10-CM

## 2013-06-26 DIAGNOSIS — D638 Anemia in other chronic diseases classified elsewhere: Secondary | ICD-10-CM

## 2013-06-26 DIAGNOSIS — E7800 Pure hypercholesterolemia, unspecified: Secondary | ICD-10-CM

## 2013-06-26 DIAGNOSIS — E559 Vitamin D deficiency, unspecified: Secondary | ICD-10-CM

## 2013-06-26 LAB — CBC WITH DIFFERENTIAL/PLATELET
Basophils Absolute: 0.1 10*3/uL (ref 0.0–0.1)
Eosinophils Absolute: 0.1 10*3/uL (ref 0.0–0.7)
HCT: 32.9 % — ABNORMAL LOW (ref 36.0–46.0)
Hemoglobin: 11.1 g/dL — ABNORMAL LOW (ref 12.0–15.0)
Lymphocytes Relative: 17.8 % (ref 12.0–46.0)
Monocytes Absolute: 0.5 10*3/uL (ref 0.1–1.0)
Monocytes Relative: 6.7 % (ref 3.0–12.0)
Neutro Abs: 5.3 10*3/uL (ref 1.4–7.7)
Neutrophils Relative %: 73.7 % (ref 43.0–77.0)
Platelets: 259 10*3/uL (ref 150.0–400.0)
RDW: 14.3 % (ref 11.5–14.6)

## 2013-06-26 LAB — TSH: TSH: 1.1 u[IU]/mL (ref 0.35–5.50)

## 2013-06-26 LAB — LIPID PANEL
Cholesterol: 194 mg/dL (ref 0–200)
Total CHOL/HDL Ratio: 3
Triglycerides: 248 mg/dL — ABNORMAL HIGH (ref 0.0–149.0)
VLDL: 49.6 mg/dL — ABNORMAL HIGH (ref 0.0–40.0)

## 2013-06-26 NOTE — Addendum Note (Signed)
Addended by: Ellamae Sia on: 06/26/2013 10:05 AM   Modules accepted: Orders

## 2013-06-28 ENCOUNTER — Encounter: Payer: Medicare Other | Admitting: Family Medicine

## 2013-06-30 ENCOUNTER — Encounter: Payer: Medicare Other | Admitting: Family Medicine

## 2013-06-30 ENCOUNTER — Telehealth: Payer: Self-pay | Admitting: Family Medicine

## 2013-06-30 NOTE — Telephone Encounter (Signed)
Pt called b/c she has a [facility] apptmt today at noon, but forgot about it until just now.  She has had the lab work drawn and wants to discuss a few issues w/you so would like to r/s the appmt to possibly one day next week or the week after.  She can come any day/any time except on Fridays, she can only come after 10:00 a.m.  Can you accommodate her? Thank you.

## 2013-06-30 NOTE — Telephone Encounter (Signed)
If she wants to do medicare PE and discuss issues it has to be a 45 min appt so - whenever you can find space to do that is ok

## 2013-07-01 ENCOUNTER — Other Ambulatory Visit: Payer: Self-pay | Admitting: Family Medicine

## 2013-07-03 NOTE — Telephone Encounter (Signed)
done

## 2013-07-03 NOTE — Telephone Encounter (Signed)
Please refill for 6 mo 

## 2013-07-03 NOTE — Telephone Encounter (Signed)
Electronic refill request, please advise  

## 2013-07-04 NOTE — Telephone Encounter (Signed)
Scheduled for 07/17/2013 for 45 min apptmt

## 2013-07-17 ENCOUNTER — Encounter: Payer: Self-pay | Admitting: Family Medicine

## 2013-07-17 ENCOUNTER — Ambulatory Visit (INDEPENDENT_AMBULATORY_CARE_PROVIDER_SITE_OTHER): Payer: Medicare Other | Admitting: Family Medicine

## 2013-07-17 ENCOUNTER — Telehealth: Payer: Self-pay | Admitting: Family Medicine

## 2013-07-17 VITALS — BP 132/78 | HR 76 | Temp 98.4°F | Ht 64.0 in | Wt 188.5 lb

## 2013-07-17 DIAGNOSIS — M353 Polymyalgia rheumatica: Secondary | ICD-10-CM

## 2013-07-17 DIAGNOSIS — Z23 Encounter for immunization: Secondary | ICD-10-CM

## 2013-07-17 DIAGNOSIS — Z0000 Encounter for general adult medical examination without abnormal findings: Secondary | ICD-10-CM

## 2013-07-17 DIAGNOSIS — M899 Disorder of bone, unspecified: Secondary | ICD-10-CM

## 2013-07-17 DIAGNOSIS — IMO0001 Reserved for inherently not codable concepts without codable children: Secondary | ICD-10-CM

## 2013-07-17 DIAGNOSIS — M791 Myalgia, unspecified site: Secondary | ICD-10-CM

## 2013-07-17 DIAGNOSIS — E7800 Pure hypercholesterolemia, unspecified: Secondary | ICD-10-CM

## 2013-07-17 DIAGNOSIS — I10 Essential (primary) hypertension: Secondary | ICD-10-CM

## 2013-07-17 DIAGNOSIS — E559 Vitamin D deficiency, unspecified: Secondary | ICD-10-CM

## 2013-07-17 DIAGNOSIS — E669 Obesity, unspecified: Secondary | ICD-10-CM

## 2013-07-17 MED ORDER — PREDNISONE 20 MG PO TABS: 20.0000 mg | ORAL_TABLET | Freq: Every day | ORAL | Status: DC

## 2013-07-17 NOTE — Assessment & Plan Note (Signed)
Reviewed health habits including diet and exercise and skin cancer prevention Reviewed appropriate screening tests for age  Also reviewed health mt list, fam hx and immunization status , as well as social and family history   See HPI Flu vaccine today dexa planned

## 2013-07-17 NOTE — Telephone Encounter (Signed)
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

## 2013-07-17 NOTE — Progress Notes (Signed)
Pre-visit discussion using our clinic review tool. No additional management support is needed unless otherwise documented below in the visit note.  

## 2013-07-17 NOTE — Assessment & Plan Note (Signed)
New c/o today- but pt mentions once she was tx with prednisone and it helped  Is in shoulder girdle and quadriceps ESR and CPK today If nl- ? If from statin or other cause  Pending results

## 2013-07-17 NOTE — Assessment & Plan Note (Signed)
Discussed how this problem influences overall health and the risks it imposes  Reviewed plan for weight loss with lower calorie diet (via better food choices and also portion control or program like weight watchers) and exercise building up to or more than 30 minutes 5 days per week including some aerobic activity    Pt states that due to chronic pain issues - exercise is out of the question

## 2013-07-17 NOTE — Assessment & Plan Note (Signed)
Schedule dexa- is overdue No fractures Disc need for calcium/ vitamin D/ wt bearing exercise and bone density test every 2 y to monitor Disc safety/ fracture risk in detail   D is low-will inc by 2000 iu daily

## 2013-07-17 NOTE — Assessment & Plan Note (Signed)
BP: 132/78 mmHg  bp in fair control at this time  No changes needed Disc lifstyle change with low sodium diet and exercise  Lab reviewed

## 2013-07-17 NOTE — Patient Instructions (Addendum)
Flu vaccine today  We will refer you for a bone density test at check out  Increase your vitamin D by another 2000 iu daily  Take care of yourself  Reduce fat in diet the best you can to reduce triglyceride level  Labs today for muscle pain - to see if there is a source

## 2013-07-17 NOTE — Assessment & Plan Note (Signed)
D level of 24 inst to add 2000 iu daily to current regimen Disc imp to bone and overall health Will continue to follow

## 2013-07-17 NOTE — Assessment & Plan Note (Signed)
Overall improved but trig high Disc goals for lipids and reasons to control them Rev labs with pt Rev low fat diet in detail - unsure if motivated for change   If cpk high or muscle pain continues need to consider if lipitor is a cause

## 2013-07-17 NOTE — Progress Notes (Signed)
Subjective:    Patient ID: [name] [name], female    DOB: [birth year], 72 y.o.   MRN: [id number]  HPI I have personally reviewed the Medicare Annual Wellness questionnaire and have noted 1. The patient's medical and social history 2. Their use of alcohol, tobacco or illicit drugs 3. Their current medications and supplements 4. The patient's functional ability including ADL's, fall risks, home safety risks and hearing or visual             impairment. 5. Diet and physical activities 6. Evidence for depression or mood disorders  The patients weight, height, BMI have been recorded in the chart and visual acuity is per eye clinic.  I have made referrals, counseling and provided education to the patient based review of the above and I have provided the pt with a written personalized care plan for preventive services.  Is doing ok overall   See scanned forms.  Routine anticipatory guidance given to patient.  See health maintenance. Flu-will get the vaccine today  Shingles vaccine 2/12  PNA vaccine 2/12  Tetanus 6/09  Colonosocpy 8/10 with a 10 year recall  Breast cancer screening mammogram 7/14  No lumps on self exam  No gyn problems at all and no hx of abn paps  Advance directive- has a living will and POA  Cognitive function addressed- see scanned forms- and if abnormal then additional documentation follows. -- no memory problems per pt   Had her hip done  That did well Now aches all over - esp shoulder girdle and also thighs Has tramdol from her orthopedic  In the past prednisone helped     PMH and SH reviewed  Meds, vitals, and allergies reviewed.   ROS: See HPI.  Otherwise negative.    Osteopenia dexa 1/03 - she is overdue  D level is down to 24 -she is taking ca plus D otc -- takes 1-2 per day    Hx of anemia of chronic dz Sees heme- Dr Chancy Milroy  Lab Results  Component Value Date   WBC 7.3 06/26/2013   HGB 11.1* 06/26/2013   HCT 32.9* 06/26/2013   MCV 86.9 06/26/2013   PLT 259.0 06/26/2013     Wt is up 5 lb  bmi is 32  Diet is her normal - gets a balanced diet  No regular exercise - too many chronic pain issues   Hyperlipidemia Lab Results  Component Value Date   CHOL 194 06/26/2013   CHOL 178 05/25/2012   CHOL 163 04/20/2011   Lab Results  Component Value Date   HDL 58.00 06/26/2013   HDL 44.10 05/25/2012   HDL 54.90 04/20/2011   Lab Results  Component Value Date   LDLCALC 97 05/25/2012   LDLCALC 81 04/20/2011   LDLCALC 106* 06/04/2010   Lab Results  Component Value Date   TRIG 248.0* 06/26/2013   TRIG 187.0* 05/25/2012   TRIG 135.0 04/20/2011   Lab Results  Component Value Date   CHOLHDL 3 06/26/2013   CHOLHDL 4 05/25/2012   CHOLHDL 3 04/20/2011   Lab Results  Component Value Date   LDLDIRECT 100.1 06/26/2013   LDLDIRECT 199.5 04/16/2010   LDLDIRECT 169.2 01/28/2009   the ratio is better  Triglycerides are high - she knows to avoid fried foods (may have eaten something fatty right before test)  bp is stable today  No cp or palpitations or headaches or edema  No side effects to medicines  BP Readings from Last 3 Encounters:  07/17/13 132/78  05/19/13 137/74  04/19/13 124/64     Lab Results  Component Value Date   TSH 1.10 06/26/2013      Chemistry      Component Value Date/Time   NA 132* 04/07/2013 0820   NA 141 02/28/2009 1250   K 3.9 04/07/2013 0820   K 4.0 02/28/2009 1250   CL 95* 04/07/2013 0820   CL 98 02/28/2009 1250   CO2 31 04/07/2013 0820   CO2 26 02/28/2009 1250   BUN 7 04/07/2013 0820   BUN 12 02/28/2009 1250   CREATININE 0.79 04/07/2013 0820   CREATININE 0.7 02/28/2009 1250      Component Value Date/Time   CALCIUM 9.2 04/07/2013 0820   CALCIUM 9.1 02/28/2009 1250   ALKPHOS 63 05/25/2012 0941   ALKPHOS 66 02/28/2009 1250   AST 22 05/25/2012 0941   AST 21 02/28/2009 1250   ALT 15 05/25/2012 0941   ALT 19 02/28/2009 1250   BILITOT 0.5 05/25/2012 0941   BILITOT 0.50 02/28/2009 1250       Patient Active Problem List   Diagnosis Date  Noted  . Encounter for Medicare annual wellness exam 07/17/2013  . Intertrigo 04/19/2013  . Skin lesion of right leg 04/19/2013  . Rectal bleeding 04/19/2013  . Expected blood loss anemia 04/05/2013  . Obese 04/05/2013  . 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 Preoperative examination 03/29/2013  . Conjunctivitis 03/06/2013  . IBS (irritable bowel syndrome) 05/30/2012  . ANEMIA OF CHRONIC DISEASE 04/16/2010  . ANXIETY 04/16/2010  . DYSPHAGIA 03/07/2009  . ESOPHAGEAL STRICTURE 03/04/2009  . GERD 03/04/2009  . UNSPECIFIED VITAMIN D DEFICIENCY 03/12/2008  . URINARY TRACT INFECTION, RECURRENT 08/07/2007  . OSTEOPENIA 02/21/2007  . FIBROCYSTIC BREAST DISEASE, HX OF 02/11/2007  . HYPERCHOLESTEROLEMIA WITH HIGH HDL 01/10/2007  . DEPRESSION 01/10/2007  . RETINAL VEIN OCCLUSION 01/10/2007  . HYPERTENSION 01/10/2007  . CAD 01/10/2007  . PEPTIC ULCER DISEASE 01/10/2007  . GALLSTONE PANCREATITIS 01/10/2007  . OSTEOARTHRITIS 01/10/2007  . SKIN CANCER, HX OF 01/10/2007   Past Medical History  Diagnosis Date  . Hx of skin cancer, basal cell 2013 and 2012  . Psoriasis   . Depression   . Anxiety   . Hypertension   . Arthritis     osteoarthritis  . Peptic ulcer disease     H pylori  . Fibrocystic breast   . Osteopenia   . Anemia     of chronic disease  . Complication of anesthesia 1980 and 1982    trouble waking up after breast biopsy   Past Surgical History  Procedure Laterality Date  . Knee surgery      x3  . Nasal sinus surgery    . Esophagogastroduodenoscopy  03/2009    ulcer,HP, GERD stricture  . Spine surgery      back surgery x 3  . Spine surgery  2005    deg. disk lumber spine   . Breast surgery    . Abdominal hysterectomy      partial in 1970s  . Cholecystectomy  2005  . Hip arthroplasty Left 2010  . Joint replacement Right 2009    knee  . Breast biopsy Left 1980 and 1982  . Total hip arthroplasty Right 04/04/2013    Procedure: RIGHT TOTAL HIP ARTHROPLASTY ANTERIOR  APPROACH;  Surgeon: Mauri Pole, MD;  Location: [facility];  Service: Orthopedics;  Laterality: Right;   History  Substance Use Topics  . Smoking status: Never Smoker   .  Smokeless tobacco: Never Used  . Alcohol Use: Yes     Comment: occasional   Family History  Problem Relation Age of Onset  . Arthritis Mother   . Emphysema Mother   . Hyperlipidemia Mother   . Hypertension Mother   . Heart disease Brother     CAD   Allergies  Allergen Reactions  . Ace Inhibitors     REACTION: cough  . Amoxicillin-Pot Clavulanate     REACTION: gi upset  . Oxaprozin     REACTION: unknown  . Pregabalin     REACTION: swelling  . Sulfonamide Derivatives     REACTION: UNKNOWN   Current Outpatient Prescriptions on File Prior to Visit  Medication Sig Dispense Refill  . atorvastatin (LIPITOR) 10 MG tablet Take 10 mg by mouth every evening.      . busPIRone (BUSPAR) 15 MG tablet TAKE 1/2 TO 1 TABLET BY MOUTH TWICE A DAY  60 tablet  5  . Calcium-Vitamin D (CALTRATE 600 PLUS-VIT D PO) Take 1 tablet by mouth 2 (two) times daily.      . hyoscyamine (LEVSIN, ANASPAZ) 0.125 MG tablet Take 0.125 mg by mouth every 6 (six) hours as needed for cramping.      Marland Kitchen losartan-hydrochlorothiazide (HYZAAR) 100-25 MG per tablet TAKE 1 TABLET BY MOUTH DAILY.  30 tablet  5  . NEXIUM 40 MG capsule TAKE 1 CAPSULE (40 MG TOTAL) BY MOUTH DAILY BEFORE BREAKFAST.  30 capsule  5  . oxymetazoline (AFRIN) 0.05 % nasal spray Place 2 sprays into the nose 2 (two) times daily as needed for congestion.       No current facility-administered medications on file prior to visit.      Review of Systems Review of Systems  Constitutional: Negative for fever, appetite change, fatigue and unexpected weight change.  Eyes: Negative for pain and visual disturbance.  Respiratory: Negative for cough and shortness of breath.   Cardiovascular: Negative for cp or palpitations    Gastrointestinal: Negative for nausea, diarrhea and constipation.   Genitourinary: Negative for urgency and frequency.  Skin: Negative for pallor or rash  MSK pos for muscle aches and pains - worse lately ./neg for joint swelling   Neurological: Negative for weakness, light-headedness, numbness and headaches.  Hematological: Negative for adenopathy. Does not bruise/bleed easily.  Psychiatric/Behavioral: Negative for dysphoric mood. The patient is not nervous/anxious.  pos for a lot of stressors        Objective:   Physical Exam  Constitutional: She appears well-developed and well-nourished. No distress.  HENT:  Head: Normocephalic and atraumatic.  Right Ear: External ear normal.  Left Ear: External ear normal.  Mouth/Throat: Oropharynx is clear and moist.  Eyes: Conjunctivae and EOM are normal. Pupils are equal, round, and reactive to light. No scleral icterus.  Neck: Normal range of motion. Neck supple. No JVD present. Carotid bruit is not present. No thyromegaly present.  Cardiovascular: Normal rate, regular rhythm and intact distal pulses.  Exam reveals no gallop.   Murmur heard. Pulmonary/Chest: Effort normal and breath sounds normal. No respiratory distress. She has no wheezes. She exhibits no tenderness.  Abdominal: Soft. Bowel sounds are normal. She exhibits no distension, no abdominal bruit and no mass. There is no tenderness.  Genitourinary: No breast swelling, tenderness, discharge or bleeding.  Breast exam: No mass, nodules, thickening, tenderness, bulging, retraction, inflamation, nipple discharge or skin changes noted.  No axillary or clavicular LA.   Musculoskeletal: Normal range of motion. She exhibits tenderness. She  exhibits no edema.  Gait improved from hip surgery-no cane Generalized tenderness without acute joint changes in shoulder girdle and quadriceps  Lymphadenopathy:    She has no cervical adenopathy.  Neurological: She is alert. She has normal reflexes. No cranial nerve deficit. She exhibits normal muscle tone. Coordination  normal.  Skin: Skin is warm and dry. No rash noted. No erythema. No pallor.  Psychiatric: She has a normal mood and affect.  Baseline affect - seems generally fatigued and stressed            Assessment & Plan:

## 2013-07-19 MED ORDER — PREDNISONE 20 MG PO TABS: 20.0000 mg | ORAL_TABLET | Freq: Every day | ORAL | Status: DC

## 2013-07-19 NOTE — Telephone Encounter (Signed)
Pt advised of Dr. Marliss Coots comments, Rx sent to pharmacy. Pt will call back to schedule f/u

## 2013-08-07 ENCOUNTER — Ambulatory Visit (INDEPENDENT_AMBULATORY_CARE_PROVIDER_SITE_OTHER): Payer: Medicare Other | Admitting: Internal Medicine

## 2013-08-07 ENCOUNTER — Encounter: Payer: Self-pay | Admitting: Internal Medicine

## 2013-08-07 VITALS — BP 134/62 | HR 84 | Temp 97.9°F | Ht 64.0 in | Wt 189.0 lb

## 2013-08-07 DIAGNOSIS — J069 Acute upper respiratory infection, unspecified: Secondary | ICD-10-CM

## 2013-08-07 MED ORDER — AZITHROMYCIN 250 MG PO TABS: ORAL_TABLET | ORAL | Status: DC

## 2013-08-07 NOTE — Patient Instructions (Signed)

## 2013-08-07 NOTE — Progress Notes (Signed)
HPI  Pt presents to the clinic today with c/o cough and nasal congestion. This started about 2 weeks ago. She is blowing yellow mucous out of her nose and coughing thick green mucous out of her chest. She denies fever, but has had chills and body aches. She has taken Alka Selzter plus, pain medication and cough syrup without much relief. She has also used Afrin nasal spray. She has had sick contacts.She has no history of allergies or asthma.  Review of Systems      Past Medical History  Diagnosis Date  . Hx of skin cancer, basal cell 2013 and 2012  . Psoriasis   . Depression   . Anxiety   . Hypertension   . Arthritis     osteoarthritis  . Peptic ulcer disease     H pylori  . Fibrocystic breast   . Osteopenia   . Anemia     of chronic disease  . Complication of anesthesia 1980 and 1982    trouble waking up after breast biopsy    Family History  Problem Relation Age of Onset  . Arthritis Mother   . Emphysema Mother   . Hyperlipidemia Mother   . Hypertension Mother   . Heart disease Brother     CAD    History   Social History  . Marital Status: Married    Spouse Name: N/A    Number of Children: N/A  . Years of Education: N/A   Occupational History  . Not on file.   Social History Main Topics  . Smoking status: Never Smoker   . Smokeless tobacco: Never Used  . Alcohol Use: Yes     Comment: occasional  . Drug Use: No  . Sexual Activity: Not Currently   Other Topics Concern  . Not on file   Social History Narrative  . No narrative on file    Allergies  Allergen Reactions  . Ace Inhibitors     REACTION: cough  . Amoxicillin-Pot Clavulanate     REACTION: gi upset  . Oxaprozin     REACTION: unknown  . Pregabalin     REACTION: swelling  . Sulfonamide Derivatives     REACTION: UNKNOWN     Constitutional: Positive headache, fatigue. Denies fever or abrupt weight changes.  HEENT:  Positive nasal congestion or sore throat. Denies eye redness, eye pain,  pressure behind the eyes, facial pain, ear pain, ringing in the ears, wax buildup, runny nose or bloody nose. Respiratory: Positive cough. Denies difficulty breathing or shortness of breath.  Cardiovascular: Denies chest pain, chest tightness, palpitations or swelling in the hands or feet.   No other specific complaints in a complete review of systems (except as listed in HPI above).  Objective:   BP 134/62  Pulse 84  Temp(Src) 97.9 F (36.6 C) (Oral)  Ht 5\' 4"  (1.626 m)  Wt 189 lb (85.73 kg)  BMI 32.43 kg/m2  SpO2 95% Wt Readings from Last 3 Encounters:  08/07/13 189 lb (85.73 kg)  07/17/13 188 lb 8 oz (85.503 kg)  05/19/13 183 lb (83.008 kg)     General: Appears her stated age, well developed, well nourished in NAD. HEENT: Head: normal shape and size; Eyes: sclera white, no icterus, conjunctiva pink, PERRLA and EOMs intact; Ears: Tm's gray and intact, normal light reflex; Nose: mucosa pink and moist, septum midline; Throat/Mouth: + PND. Teeth present, mucosa erythematous and moist, no exudate noted, no lesions or ulcerations noted.  Neck: Mild cervical lymphadenopathy. Neck  supple, trachea midline. No massses, lumps or thyromegaly present.  Cardiovascular: Normal rate and rhythm. S1,S2 noted.  No murmur, rubs or gallops noted. No JVD or BLE edema. No carotid bruits noted. Pulmonary/Chest: Normal effort and positive vesicular breath sounds. No respiratory distress. No wheezes, rales or ronchi noted.      Assessment & Plan:   Upper Respiratory Infection:  Get some rest and drink plenty of water Do salt water gargles for the sore throat eRx for Azithromax x 5 days Continue Delsym cough syrup  RTC as needed or if symptoms persist.

## 2013-08-07 NOTE — Progress Notes (Signed)
Pre-visit discussion using our clinic review tool. No additional management support is needed unless otherwise documented below in the visit note.  

## 2013-08-14 ENCOUNTER — Encounter: Payer: Self-pay | Admitting: Family Medicine

## 2013-08-14 ENCOUNTER — Ambulatory Visit (INDEPENDENT_AMBULATORY_CARE_PROVIDER_SITE_OTHER): Payer: Medicare Other | Admitting: Family Medicine

## 2013-08-14 VITALS — BP 142/64 | HR 92 | Temp 98.2°F | Ht 64.0 in | Wt 188.8 lb

## 2013-08-14 DIAGNOSIS — J069 Acute upper respiratory infection, unspecified: Secondary | ICD-10-CM

## 2013-08-14 DIAGNOSIS — M353 Polymyalgia rheumatica: Secondary | ICD-10-CM

## 2013-08-14 MED ORDER — PREDNISONE 10 MG PO TABS: ORAL_TABLET | ORAL | Status: DC

## 2013-08-14 NOTE — Patient Instructions (Addendum)
Take 20 mg of prednisone daily for 2 more weeks  Then decrease to 15 mg daily for 1 week (1 1/2 of the 10 mg pills) Then decrease to 10 mg daily for 2 weeks  Follow up with me at that point (about 5 weeks)- stay on 10 mg until you see me  If cough increases or other problems please let me know

## 2013-08-14 NOTE — Progress Notes (Signed)
Pre-visit discussion using our clinic review tool. No additional management support is needed unless otherwise documented below in the visit note.  

## 2013-08-14 NOTE — Progress Notes (Signed)
Subjective:    Patient ID: [name] [name], female    DOB: [birth year], 72 y.o.   MRN: [id number]  HPI Here for f/u of suspected PMR  Disc diffuse muscle pain last visit incl shoulder girdle   ESR was 49 CPK was nl at 85  Started on prednisone 20 Has definitely helped her symptoms - like a "miracle" Neck and shoulders especially are so much better  Thigh and arm pain are better too  Hands feel better too  No swollen joints  Was in with uri last Monday also  Has been 3 weeks  Has improved  Coughing fits are not nearly as bad  Still has some cough and ears still feel funny   Patient Active Problem List   Diagnosis Date Noted  . Encounter for Medicare annual wellness exam 07/17/2013  . Muscle pain 07/17/2013  . PMR (polymyalgia rheumatica) 07/17/2013  . Intertrigo 04/19/2013  . Rectal bleeding 04/19/2013  . Expected blood loss anemia 04/05/2013  . Obese 04/05/2013  . 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 Conjunctivitis 03/06/2013  . IBS (irritable bowel syndrome) 05/30/2012  . ANEMIA OF CHRONIC DISEASE 04/16/2010  . ANXIETY 04/16/2010  . DYSPHAGIA 03/07/2009  . ESOPHAGEAL STRICTURE 03/04/2009  . GERD 03/04/2009  . UNSPECIFIED VITAMIN D DEFICIENCY 03/12/2008  . URINARY TRACT INFECTION, RECURRENT 08/07/2007  . OSTEOPENIA 02/21/2007  . FIBROCYSTIC BREAST DISEASE, HX OF 02/11/2007  . HYPERCHOLESTEROLEMIA WITH HIGH HDL 01/10/2007  . DEPRESSION 01/10/2007  . RETINAL VEIN OCCLUSION 01/10/2007  . HYPERTENSION 01/10/2007  . CAD 01/10/2007  . PEPTIC ULCER DISEASE 01/10/2007  . GALLSTONE PANCREATITIS 01/10/2007  . OSTEOARTHRITIS 01/10/2007  . SKIN CANCER, HX OF 01/10/2007   Past Medical History  Diagnosis Date  . Hx of skin cancer, basal cell 2013 and 2012  . Psoriasis   . Depression   . Anxiety   . Hypertension   . Arthritis     osteoarthritis  . Peptic ulcer disease     H pylori  . Fibrocystic breast   . Osteopenia   . Anemia     of chronic disease  . Complication  of anesthesia 1980 and 1982    trouble waking up after breast biopsy   Past Surgical History  Procedure Laterality Date  . Knee surgery      x3  . Nasal sinus surgery    . Esophagogastroduodenoscopy  03/2009    ulcer,HP, GERD stricture  . Spine surgery      back surgery x 3  . Spine surgery  2005    deg. disk lumber spine   . Breast surgery    . Abdominal hysterectomy      partial in 1970s  . Cholecystectomy  2005  . Hip arthroplasty Left 2010  . Joint replacement Right 2009    knee  . Breast biopsy Left 1980 and 1982  . Total hip arthroplasty Right 04/04/2013    Procedure: RIGHT TOTAL HIP ARTHROPLASTY ANTERIOR APPROACH;  Surgeon: Mauri Pole, MD;  Location: [facility];  Service: Orthopedics;  Laterality: Right;   History  Substance Use Topics  . Smoking status: Never Smoker   . Smokeless tobacco: Never Used  . Alcohol Use: Yes     Comment: occasional   Family History  Problem Relation Age of Onset  . Arthritis Mother   . Emphysema Mother   . Hyperlipidemia Mother   . Hypertension Mother   . Heart disease Brother     CAD   Allergies  Allergen  Reactions  . Ace Inhibitors     REACTION: cough  . Amoxicillin-Pot Clavulanate     REACTION: gi upset  . Oxaprozin     REACTION: unknown  . Pregabalin     REACTION: swelling  . Sulfonamide Derivatives     REACTION: UNKNOWN   Current Outpatient Prescriptions on File Prior to Visit  Medication Sig Dispense Refill  . atorvastatin (LIPITOR) 10 MG tablet Take 10 mg by mouth every evening.      . busPIRone (BUSPAR) 15 MG tablet TAKE 1/2 TO 1 TABLET BY MOUTH TWICE A DAY  60 tablet  5  . Calcium-Vitamin D (CALTRATE 600 PLUS-VIT D PO) Take 1 tablet by mouth 2 (two) times daily.      . hyoscyamine (LEVSIN, ANASPAZ) 0.125 MG tablet Take 0.125 mg by mouth every 6 (six) hours as needed for cramping.      Marland Kitchen losartan-hydrochlorothiazide (HYZAAR) 100-25 MG per tablet TAKE 1 TABLET BY MOUTH DAILY.  30 tablet  5  . NEXIUM 40 MG capsule  TAKE 1 CAPSULE (40 MG TOTAL) BY MOUTH DAILY BEFORE BREAKFAST.  30 capsule  5  . oxymetazoline (AFRIN) 0.05 % nasal spray Place 2 sprays into the nose 2 (two) times daily as needed for congestion.      . predniSONE (DELTASONE) 20 MG tablet Take 1 tablet (20 mg total) by mouth daily with breakfast.  30 tablet  0  . traMADol (ULTRAM) 50 MG tablet        No current facility-administered medications on file prior to visit.      Review of Systems    Review of Systems  Constitutional: Negative for fever, appetite change, and unexpected weight change. pos for fatigue Eyes: Negative for pain and visual disturbance.  Respiratory: Negative for wheeze  and shortness of breath.   Cardiovascular: Negative for cp or palpitations    Gastrointestinal: Negative for nausea, diarrhea and constipation.  Genitourinary: Negative for urgency and frequency.  Skin: Negative for pallor or rash   Neurological: Negative for weakness, light-headedness, numbness and headaches.  Hematological: Negative for adenopathy. Does not bruise/bleed easily.  Psychiatric/Behavioral: Negative for dysphoric mood. The patient is not nervous/anxious.      Objective:   Physical Exam  Constitutional: She appears well-developed and well-nourished. No distress.  obese and well appearing   HENT:  Head: Normocephalic and atraumatic.  Right Ear: External ear normal.  Left Ear: External ear normal.  Mouth/Throat: Oropharynx is clear and moist. No oropharyngeal exudate.  Nares are injected and congested  Clear rhinorrhea No facial tenderness  Eyes: Conjunctivae and EOM are normal. Pupils are equal, round, and reactive to light. Right eye exhibits no discharge. Left eye exhibits no discharge.  Neck: Normal range of motion. Neck supple. No JVD present. No thyromegaly present.  Cardiovascular: Normal rate and regular rhythm.   Pulmonary/Chest: Effort normal and breath sounds normal. No respiratory distress. She has no wheezes. She has  no rales.  Harsh bs Good air exch occ dry cough  Musculoskeletal: Normal range of motion. She exhibits no edema and no tenderness.  No shoulder girdle tenderness Nl rom neck and back  Lymphadenopathy:    She has no cervical adenopathy.  Neurological: She is alert.  Skin: Skin is warm and dry. No rash noted. No erythema. No pallor.  Psychiatric: She has a normal mood and affect.          Assessment & Plan:

## 2013-08-17 NOTE — Assessment & Plan Note (Signed)
Disc symptomatic care - see instructions on AVS  Rest /fluids/ nasal saline / guifenesin Update if not starting to improve in a week or if worsening

## 2013-08-17 NOTE — Assessment & Plan Note (Signed)
Rev labs Much imp hx and exam on predniosne 20  Will begin to slowly taper as tolerated in 2 weeks - to 15 and then 10 mg daily  Then f/u  Will update if symptoms return Disc side eff of prednisone in detail

## 2013-08-24 ENCOUNTER — Other Ambulatory Visit: Payer: Self-pay | Admitting: Family Medicine

## 2013-08-25 NOTE — Telephone Encounter (Signed)
Please refill times 5 

## 2013-08-25 NOTE — Telephone Encounter (Signed)
done

## 2013-08-25 NOTE — Telephone Encounter (Signed)
Electronic refill request, please advise  

## 2013-09-04 ENCOUNTER — Encounter: Payer: Self-pay | Admitting: Family Medicine

## 2013-09-08 LAB — HM DEXA SCAN

## 2013-09-11 ENCOUNTER — Encounter: Payer: Self-pay | Admitting: Family Medicine

## 2013-09-11 ENCOUNTER — Encounter: Payer: Self-pay | Admitting: *Deleted

## 2013-09-27 ENCOUNTER — Ambulatory Visit (INDEPENDENT_AMBULATORY_CARE_PROVIDER_SITE_OTHER): Payer: Medicare Other | Admitting: Family Medicine

## 2013-09-27 ENCOUNTER — Encounter: Payer: Self-pay | Admitting: Family Medicine

## 2013-09-27 VITALS — BP 142/79 | HR 85 | Temp 98.0°F | Ht 64.0 in | Wt 193.0 lb

## 2013-09-27 DIAGNOSIS — M949 Disorder of cartilage, unspecified: Secondary | ICD-10-CM

## 2013-09-27 DIAGNOSIS — M899 Disorder of bone, unspecified: Secondary | ICD-10-CM

## 2013-09-27 DIAGNOSIS — M353 Polymyalgia rheumatica: Secondary | ICD-10-CM

## 2013-09-27 MED ORDER — RALOXIFENE HCL 60 MG PO TABS: 60.0000 mg | ORAL_TABLET | Freq: Every day | ORAL | Status: DC

## 2013-09-27 NOTE — Patient Instructions (Signed)
Go back up to 15 mg daily of prednisone for 1 month- then if you can , decrease back to 10 mg  Follow up with me in about 6-7 weeks  If no improvement -let me know  Here is px and info on evista- for osteoporosis - see what you think

## 2013-09-27 NOTE — Progress Notes (Signed)
Subjective:    Patient ID: [name] [name], female    DOB: Apr 07, 1941, 73 y.o.   MRN: [id number]  HPI Here for follow up of PMR  On prednisone  Improved with 20 mg greatly - then we began to wean -now on 10 mg   (was on 20 and then 15 for 2 weeks)   Wt is up 5 lb (also went through the holidays) and then a beach trip   Some pain has come back - for the most part in her anterior thigh muscles -with stiffness and also upper arms  Esp when rising from a chair   Hands and wrists are much better - works with her hands a lot  Known arthritis in her neck - that responds also   Had her dexa -osteopenia of forearm is down 11  With T score -1.8  Cannot image hip or LS  No hx of fragility fractures Does take PPI Wants to avoid bisphosphenate due to esophageal problems Would consider evista   Patient Active Problem List   Diagnosis Date Noted  . Viral upper respiratory illness 08/14/2013  . Encounter for Medicare annual wellness exam 07/17/2013  . Muscle pain 07/17/2013  . PMR (polymyalgia rheumatica) 07/17/2013  . Intertrigo 04/19/2013  . Rectal bleeding 04/19/2013  . Expected blood loss anemia 04/05/2013  . Obese 04/05/2013  . 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 Conjunctivitis 03/06/2013  . IBS (irritable bowel syndrome) 05/30/2012  . ANEMIA OF CHRONIC DISEASE 04/16/2010  . ANXIETY 04/16/2010  . DYSPHAGIA 03/07/2009  . ESOPHAGEAL STRICTURE 03/04/2009  . GERD 03/04/2009  . UNSPECIFIED VITAMIN D DEFICIENCY 03/12/2008  . URINARY TRACT INFECTION, RECURRENT 08/07/2007  . OSTEOPENIA 02/21/2007  . FIBROCYSTIC BREAST DISEASE, HX OF 02/11/2007  . HYPERCHOLESTEROLEMIA WITH HIGH HDL 01/10/2007  . DEPRESSION 01/10/2007  . RETINAL VEIN OCCLUSION 01/10/2007  . HYPERTENSION 01/10/2007  . CAD 01/10/2007  . PEPTIC ULCER DISEASE 01/10/2007  . GALLSTONE PANCREATITIS 01/10/2007  . OSTEOARTHRITIS 01/10/2007  . SKIN CANCER, HX OF 01/10/2007   Past Medical History  Diagnosis Date  . Hx of  skin cancer, basal cell 2013 and 2012  . Psoriasis   . Depression   . Anxiety   . Hypertension   . Arthritis     osteoarthritis  . Peptic ulcer disease     H pylori  . Fibrocystic breast   . Osteopenia   . Anemia     of chronic disease  . Complication of anesthesia 1980 and 1982    trouble waking up after breast biopsy   Past Surgical History  Procedure Laterality Date  . Knee surgery      x3  . Nasal sinus surgery    . Esophagogastroduodenoscopy  03/2009    ulcer,HP, GERD stricture  . Spine surgery      back surgery x 3  . Spine surgery  2005    deg. disk lumber spine   . Breast surgery    . Abdominal hysterectomy      partial in 1970s  . Cholecystectomy  2005  . Hip arthroplasty Left 2010  . Joint replacement Right 2009    knee  . Breast biopsy Left 1980 and 1982  . Total hip arthroplasty Right 04/04/2013    Procedure: RIGHT TOTAL HIP ARTHROPLASTY ANTERIOR APPROACH;  Surgeon: Mauri Pole, MD;  Location: [facility];  Service: Orthopedics;  Laterality: Right;   History  Substance Use Topics  . Smoking status: Never Smoker   . Smokeless tobacco:  Never Used  . Alcohol Use: Yes     Comment: occasional   Family History  Problem Relation Age of Onset  . Arthritis Mother   . Emphysema Mother   . Hyperlipidemia Mother   . Hypertension Mother   . Heart disease Brother     CAD   Allergies  Allergen Reactions  . Ace Inhibitors     REACTION: cough  . Amoxicillin-Pot Clavulanate     REACTION: gi upset  . Oxaprozin     REACTION: unknown  . Pregabalin     REACTION: swelling  . Sulfonamide Derivatives     REACTION: UNKNOWN   Current Outpatient Prescriptions on File Prior to Visit  Medication Sig Dispense Refill  . atorvastatin (LIPITOR) 10 MG tablet Take 10 mg by mouth every evening.      . busPIRone (BUSPAR) 15 MG tablet TAKE 1/2 TO 1 TABLET BY MOUTH TWICE A DAY  60 tablet  5  . Calcium-Vitamin D (CALTRATE 600 PLUS-VIT D PO) Take 1 tablet by mouth 2 (two) times  daily.      . hyoscyamine (LEVSIN, ANASPAZ) 0.125 MG tablet Take 0.125 mg by mouth every 6 (six) hours as needed for cramping.      . hyoscyamine (LEVSIN, ANASPAZ) 0.125 MG tablet TAKE 1 TABLET BY MOUTH AS NEEDED.  30 tablet  4  . losartan-hydrochlorothiazide (HYZAAR) 100-25 MG per tablet TAKE 1 TABLET BY MOUTH DAILY.  30 tablet  5  . NEXIUM 40 MG capsule TAKE 1 CAPSULE (40 MG TOTAL) BY MOUTH DAILY BEFORE BREAKFAST.  30 capsule  5  . oxymetazoline (AFRIN) 0.05 % nasal spray Place 2 sprays into the nose 2 (two) times daily as needed for congestion.      . predniSONE (DELTASONE) 10 MG tablet Take by mouth as directed  60 tablet  1  . predniSONE (DELTASONE) 20 MG tablet Take 1 tablet (20 mg total) by mouth daily with breakfast.  30 tablet  0  . traMADol (ULTRAM) 50 MG tablet        No current facility-administered medications on file prior to visit.    Review of Systems Review of Systems  Constitutional: Negative for fever, appetite change, fatigue and unexpected weight change.  Eyes: Negative for pain and visual disturbance.  Respiratory: Negative for cough and shortness of breath.   Cardiovascular: Negative for cp or palpitations    Gastrointestinal: Negative for nausea, diarrhea and constipation.  Genitourinary: Negative for urgency and frequency.  Skin: Negative for pallor or rash   MSK pos for muscle aches and also arthritis  Neurological: Negative for weakness, light-headedness, numbness and headaches.  Hematological: Negative for adenopathy. Does not bruise/bleed easily.  Psychiatric/Behavioral: Negative for dysphoric mood. The patient is not nervous/anxious.         Objective:   Physical Exam  Constitutional: She appears well-developed and well-nourished. No distress.  obese and well appearing   HENT:  Head: Normocephalic and atraumatic.  Mouth/Throat: Oropharynx is clear and moist.  Eyes: Conjunctivae and EOM are normal. Pupils are equal, round, and reactive to light. No  scleral icterus.  Neck: Normal range of motion. Neck supple.  Cardiovascular: Normal rate and regular rhythm.   Pulmonary/Chest: Effort normal and breath sounds normal.  Musculoskeletal: She exhibits tenderness. She exhibits no edema.  Tenderness in musculature of upper arms and thighs  Some joint changes of OA   Lymphadenopathy:    She has no cervical adenopathy.  Neurological: She is alert. She has normal reflexes. No  cranial nerve deficit. She exhibits normal muscle tone. Coordination normal.  Skin: Skin is warm and dry. No rash noted. No erythema. No pallor.  Psychiatric: She has a normal mood and affect.          Assessment & Plan:

## 2013-09-27 NOTE — Progress Notes (Signed)
Pre-visit discussion using our clinic review tool. No additional management support is needed unless otherwise documented below in the visit note.  

## 2013-09-28 NOTE — Assessment & Plan Note (Signed)
Pt has risk factors and recent dec in BMD of arm (cannot use hips or spine for her assessment) Disc opt for tx  Afraid of bisphosphenates due to sensitive gerd  Will try evista- given info on drug and side eff to review and also px to try  Will re check dexa 2 y Disc need for calcium/ vitamin D/ wt bearing exercise and bone density test every 2 y to monitor Disc safety/ fracture risk in detail

## 2013-09-28 NOTE — Assessment & Plan Note (Signed)
Pt is symptomatic again at 10 mg  - will adv to 15 mg for another month and then cut to 10  F/u planned  Rev PMR /tx/ symptoms  Asked pt to update if no improvement

## 2013-10-20 ENCOUNTER — Other Ambulatory Visit: Payer: Self-pay | Admitting: Family Medicine

## 2013-10-20 MED ORDER — ATORVASTATIN CALCIUM 10 MG PO TABS: 10.0000 mg | ORAL_TABLET | Freq: Every evening | ORAL | Status: DC

## 2013-10-20 MED ORDER — BUSPIRONE HCL 15 MG PO TABS: ORAL_TABLET | ORAL | Status: DC

## 2013-10-20 MED ORDER — ESOMEPRAZOLE MAGNESIUM 40 MG PO CPDR: DELAYED_RELEASE_CAPSULE | ORAL | Status: DC

## 2013-10-20 MED ORDER — LOSARTAN POTASSIUM-HCTZ 100-25 MG PO TABS: ORAL_TABLET | ORAL | Status: DC

## 2013-10-20 NOTE — Telephone Encounter (Signed)
Pharmacy requesting 90 day supply on Rxs done

## 2013-11-07 ENCOUNTER — Ambulatory Visit (INDEPENDENT_AMBULATORY_CARE_PROVIDER_SITE_OTHER): Payer: Medicare Other | Admitting: Family Medicine

## 2013-11-07 ENCOUNTER — Encounter: Payer: Self-pay | Admitting: Family Medicine

## 2013-11-07 VITALS — BP 116/60 | HR 86 | Temp 98.0°F | Ht 64.0 in | Wt 195.0 lb

## 2013-11-07 DIAGNOSIS — M353 Polymyalgia rheumatica: Secondary | ICD-10-CM

## 2013-11-07 LAB — SEDIMENTATION RATE: SED RATE: 18 mm/h (ref 0–22)

## 2013-11-07 MED ORDER — PREDNISONE 10 MG PO TABS: ORAL_TABLET | ORAL | Status: DC

## 2013-11-07 NOTE — Progress Notes (Signed)
Pre visit review using our clinic review tool, if applicable. No additional management support is needed unless otherwise documented below in the visit note. 

## 2013-11-07 NOTE — Progress Notes (Signed)
Subjective:    Patient ID: [name] [name], female    DOB: 14-[name]-1941, 73 y.o.   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 Viral upper respiratory illness 08/14/2013  . Encounter for Medicare annual wellness exam 07/17/2013  . Muscle pain 07/17/2013  . PMR (polymyalgia rheumatica) 07/17/2013  . Intertrigo 04/19/2013  . Rectal bleeding 04/19/2013  . Expected blood loss anemia 04/05/2013  . Obese 04/05/2013  . 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 Conjunctivitis 03/06/2013  . IBS (irritable bowel syndrome) 05/30/2012  . ANEMIA OF CHRONIC DISEASE 04/16/2010  . ANXIETY 04/16/2010  . DYSPHAGIA 03/07/2009  . ESOPHAGEAL STRICTURE 03/04/2009  . GERD 03/04/2009  . UNSPECIFIED VITAMIN D DEFICIENCY 03/12/2008  . URINARY TRACT INFECTION, RECURRENT 08/07/2007  . OSTEOPENIA 02/21/2007  . FIBROCYSTIC BREAST DISEASE, HX OF 02/11/2007  . HYPERCHOLESTEROLEMIA WITH HIGH HDL 01/10/2007  . DEPRESSION 01/10/2007  . RETINAL VEIN OCCLUSION 01/10/2007  . HYPERTENSION 01/10/2007  . CAD 01/10/2007  . PEPTIC ULCER DISEASE 01/10/2007  . GALLSTONE PANCREATITIS 01/10/2007  . OSTEOARTHRITIS 01/10/2007  . SKIN CANCER, HX OF 01/10/2007   Past Medical History  Diagnosis Date  . Hx of skin cancer, basal cell 2013 and 2012  . Psoriasis   . Depression   . Anxiety   . Hypertension   . Arthritis     osteoarthritis  . Peptic ulcer disease     H pylori  . Fibrocystic breast   . Osteopenia   . Anemia     of chronic disease  . Complication of anesthesia 1980 and 1982    trouble waking up after breast biopsy   Past Surgical History  Procedure Laterality Date  . Knee surgery      x3  . Nasal sinus surgery    . Esophagogastroduodenoscopy  03/2009      ulcer,HP, GERD stricture  . Spine surgery      back surgery x 3  . Spine surgery  2005    deg. disk lumber spine   . Breast surgery    . Abdominal hysterectomy      partial in 1970s  . Cholecystectomy  2005  . Hip arthroplasty Left 2010  . Joint replacement Right 2009    knee  . Breast biopsy Left 1980 and 1982  . Total hip arthroplasty Right 04/04/2013    Procedure: RIGHT TOTAL HIP ARTHROPLASTY ANTERIOR APPROACH;  Surgeon: Mauri Pole, MD;  Location: [facility];  Service: Orthopedics;  Laterality: Right;   History  Substance Use Topics  . Smoking status: Never Smoker   . Smokeless tobacco: Never Used  . Alcohol Use: Yes     Comment: occasional   Family History  Problem Relation Age of Onset  . Arthritis Mother   . Emphysema Mother   . Hyperlipidemia Mother   . Hypertension Mother   . Heart disease Brother     CAD   Allergies  Allergen Reactions  . Ace Inhibitors     REACTION: cough  . Amoxicillin-Pot Clavulanate     REACTION: gi upset  . Oxaprozin     REACTION: unknown  . Pregabalin  REACTION: swelling  . Sulfonamide Derivatives     REACTION: UNKNOWN   Current Outpatient Prescriptions on File Prior to Visit  Medication Sig Dispense Refill  . atorvastatin (LIPITOR) 10 MG tablet Take 1 tablet (10 mg total) by mouth every evening.  90 tablet  0  . busPIRone (BUSPAR) 15 MG tablet TAKE 1/2 TO 1 TABLET BY MOUTH TWICE A DAY  180 tablet  0  . Calcium-Vitamin D (CALTRATE 600 PLUS-VIT D PO) Take 1 tablet by mouth 2 (two) times daily.      [facility] esomeprazole (NEXIUM) 40 MG capsule TAKE 1 CAPSULE (40 MG TOTAL) BY MOUTH DAILY BEFORE BREAKFAST.  90 capsule  0  . hyoscyamine (LEVSIN, ANASPAZ) 0.125 MG tablet TAKE 1 TABLET BY MOUTH AS NEEDED.  30 tablet  4  . losartan-hydrochlorothiazide (HYZAAR) 100-25 MG per tablet TAKE 1 TABLET BY MOUTH DAILY.  90 tablet  0  . oxymetazoline (AFRIN) 0.05 % nasal spray Place 2 sprays into the nose 2 (two) times daily as needed for congestion.       . predniSONE (DELTASONE) 10 MG tablet Take by mouth as directed  60 tablet  1  . traMADol (ULTRAM) 50 MG tablet        No current facility-administered medications on file prior to visit.      Review of Systems Review of Systems  Constitutional: Negative for fever, appetite change, fatigue and unexpected weight change.  Eyes: Negative for pain and visual disturbance.  Respiratory: Negative for cough and shortness of breath.   Cardiovascular: Negative for cp or palpitations    Gastrointestinal: Negative for nausea, diarrhea and constipation.  Genitourinary: Negative for urgency and frequency.  Skin: Negative for pallor or rash   MSK pos for oa with no swollen joints presently  Neurological: Negative for weakness, light-headedness, numbness and headaches.  Hematological: Negative for adenopathy. Does not bruise/bleed easily.  Psychiatric/Behavioral: Negative for dysphoric mood. The patient is not nervous/anxious.         Objective:   Physical Exam  Constitutional: She appears well-developed and well-nourished. No distress.  obese and well appearing  As swelling of the face from prednisone  HENT:  Head: Normocephalic.  Mouth/Throat: Oropharynx is clear and moist.  Eyes: Conjunctivae and EOM are normal. Pupils are equal, round, and reactive to light. No scleral icterus.  Neck: Normal range of motion. Neck supple.  Cardiovascular: Normal rate.   Pulmonary/Chest: Effort normal and breath sounds normal.  Musculoskeletal: She exhibits no edema and no tenderness.  No bony or myofascial tenderness today  Lymphadenopathy:    She has no cervical adenopathy.  Neurological: She is alert.  Skin: Skin is warm and dry. No rash noted. No erythema. No pallor.  Psychiatric: She has a normal mood and affect.          Assessment & Plan:

## 2013-11-07 NOTE — Patient Instructions (Signed)
Go down to 10 mg of prednisone for 1 month  If doing well after a month take it down to 5 mg daily (1/2 of a 10) After 2 weeks of 5 mg daily - then call and let me know how you are doing to see if we can wean it more  The goal is to get you off of it  Lab today   Take care of yourself

## 2013-11-08 ENCOUNTER — Encounter: Payer: Self-pay | Admitting: *Deleted

## 2013-11-08 NOTE — Assessment & Plan Note (Signed)
Further improvement Dec presnisone to 10 mg for 1 mo and then 5 for 2 weeks and then update  If doing well will then dec in 1 mg intervals  Esr today Enc exercise

## 2013-11-14 ENCOUNTER — Telehealth: Payer: Self-pay | Admitting: Oncology

## 2013-11-14 NOTE — Telephone Encounter (Signed)
, °

## 2013-11-17 ENCOUNTER — Other Ambulatory Visit: Payer: Self-pay | Admitting: Hematology and Oncology

## 2013-11-17 ENCOUNTER — Telehealth: Payer: Self-pay

## 2013-11-17 ENCOUNTER — Ambulatory Visit (HOSPITAL_BASED_OUTPATIENT_CLINIC_OR_DEPARTMENT_OTHER): Payer: Medicare Other | Admitting: Hematology and Oncology

## 2013-11-17 ENCOUNTER — Other Ambulatory Visit: Payer: Self-pay

## 2013-11-17 ENCOUNTER — Telehealth: Payer: Self-pay | Admitting: Oncology

## 2013-11-17 ENCOUNTER — Ambulatory Visit (HOSPITAL_COMMUNITY)
Admission: RE | Admit: 2013-11-17 | Discharge: 2013-11-17 | Disposition: A | Discharge status: Home / Self Care | Payer: Medicare Other | Source: Ambulatory Visit | Attending: Hematology and Oncology | Admitting: Hematology and Oncology

## 2013-11-17 ENCOUNTER — Telehealth: Payer: Self-pay | Admitting: Hematology and Oncology

## 2013-11-17 ENCOUNTER — Ambulatory Visit (HOSPITAL_BASED_OUTPATIENT_CLINIC_OR_DEPARTMENT_OTHER): Payer: Medicare Other

## 2013-11-17 ENCOUNTER — Encounter: Payer: Self-pay | Admitting: *Deleted

## 2013-11-17 ENCOUNTER — Other Ambulatory Visit: Payer: Self-pay | Admitting: *Deleted

## 2013-11-17 ENCOUNTER — Other Ambulatory Visit (HOSPITAL_BASED_OUTPATIENT_CLINIC_OR_DEPARTMENT_OTHER): Payer: Medicare Other

## 2013-11-17 VITALS — BP 136/75 | HR 102 | Temp 98.3°F | Resp 18 | Ht 64.0 in | Wt 196.3 lb

## 2013-11-17 DIAGNOSIS — D72829 Elevated white blood cell count, unspecified: Secondary | ICD-10-CM

## 2013-11-17 DIAGNOSIS — R059 Cough, unspecified: Secondary | ICD-10-CM

## 2013-11-17 DIAGNOSIS — R319 Hematuria, unspecified: Secondary | ICD-10-CM

## 2013-11-17 DIAGNOSIS — D638 Anemia in other chronic diseases classified elsewhere: Secondary | ICD-10-CM

## 2013-11-17 DIAGNOSIS — R05 Cough: Secondary | ICD-10-CM

## 2013-11-17 DIAGNOSIS — R918 Other nonspecific abnormal finding of lung field: Secondary | ICD-10-CM

## 2013-11-17 DIAGNOSIS — D539 Nutritional anemia, unspecified: Secondary | ICD-10-CM

## 2013-11-17 DIAGNOSIS — R7989 Other specified abnormal findings of blood chemistry: Secondary | ICD-10-CM

## 2013-11-17 DIAGNOSIS — N390 Urinary tract infection, site not specified: Secondary | ICD-10-CM

## 2013-11-17 DIAGNOSIS — M898X9 Other specified disorders of bone, unspecified site: Secondary | ICD-10-CM

## 2013-11-17 LAB — CBC WITH DIFFERENTIAL/PLATELET
BASO%: 0.7 % (ref 0.0–2.0)
Basophils Absolute: 0.1 10*3/uL (ref 0.0–0.1)
EOS ABS: 0.1 10*3/uL (ref 0.0–0.5)
EOS%: 0.7 % (ref 0.0–7.0)
HEMATOCRIT: 32.3 % — AB (ref 34.8–46.6)
HEMOGLOBIN: 10.7 g/dL — AB (ref 11.6–15.9)
LYMPH%: 7.3 % — AB (ref 14.0–49.7)
MCH: 29.5 pg (ref 25.1–34.0)
MCHC: 33.3 g/dL (ref 31.5–36.0)
MCV: 88.5 fL (ref 79.5–101.0)
MONO#: 0.7 10*3/uL (ref 0.1–0.9)
MONO%: 5 % (ref 0.0–14.0)
NEUT%: 86.3 % — AB (ref 38.4–76.8)
NEUTROS ABS: 11.6 10*3/uL — AB (ref 1.5–6.5)
PLATELETS: 274 10*3/uL (ref 145–400)
RBC: 3.65 10*6/uL — ABNORMAL LOW (ref 3.70–5.45)
RDW: 13.9 % (ref 11.2–14.5)
WBC: 13.4 10*3/uL — ABNORMAL HIGH (ref 3.9–10.3)
lymph#: 1 10*3/uL (ref 0.9–3.3)

## 2013-11-17 LAB — IRON AND TIBC CHCC
%SAT: 16 % — AB (ref 21–57)
IRON: 49 ug/dL (ref 41–142)
TIBC: 306 ug/dL (ref 236–444)
UIBC: 258 ug/dL (ref 120–384)

## 2013-11-17 LAB — COMPREHENSIVE METABOLIC PANEL (CC13)
ALBUMIN: 3.6 g/dL (ref 3.5–5.0)
ALT: 15 U/L (ref 0–55)
AST: 15 U/L (ref 5–34)
Alkaline Phosphatase: 69 U/L (ref 40–150)
Anion Gap: 12 mEq/L — ABNORMAL HIGH (ref 3–11)
BUN: 17.3 mg/dL (ref 7.0–26.0)
CO2: 23 mEq/L (ref 22–29)
Calcium: 9.4 mg/dL (ref 8.4–10.4)
Chloride: 100 mEq/L (ref 98–109)
Creatinine: 1.1 mg/dL (ref 0.6–1.1)
Glucose: 120 mg/dl (ref 70–140)
Potassium: 4 mEq/L (ref 3.5–5.1)
Sodium: 135 mEq/L — ABNORMAL LOW (ref 136–145)
Total Bilirubin: 0.67 mg/dL (ref 0.20–1.20)
Total Protein: 7.2 g/dL (ref 6.4–8.3)

## 2013-11-17 LAB — URINALYSIS, MICROSCOPIC - CHCC
BLOOD: NEGATIVE
Bilirubin (Urine): NEGATIVE
Glucose: NEGATIVE mg/dL
Ketones: NEGATIVE mg/dL
Nitrite: POSITIVE
PH: 6 (ref 4.6–8.0)
PROTEIN: NEGATIVE mg/dL
SPECIFIC GRAVITY, URINE: 1.01 (ref 1.003–1.035)
UROBILINOGEN UR: 0.2 mg/dL (ref 0.2–1)

## 2013-11-17 LAB — FERRITIN CHCC: Ferritin: 194 ng/ml (ref 9–269)

## 2013-11-17 LAB — RETICULOCYTES
IMMATURE RETIC FRACT: 4.1 % (ref 1.60–10.00)
RBC: 3.63 10*6/uL — ABNORMAL LOW (ref 3.70–5.45)
RETIC %: 1.68 % (ref 0.70–2.10)
RETIC CT ABS: 60.98 10*3/uL (ref 33.70–90.70)

## 2013-11-17 LAB — LACTATE DEHYDROGENASE (CC13): LDH: 264 U/L — ABNORMAL HIGH (ref 125–245)

## 2013-11-17 MED ORDER — LEVOFLOXACIN 500 MG PO TABS
500.0000 mg | ORAL_TABLET | Freq: Every day | ORAL | Status: DC
Start: 2013-11-17 — End: 2013-11-24

## 2013-11-17 NOTE — Telephone Encounter (Signed)
, °

## 2013-11-17 NOTE — Telephone Encounter (Signed)
Advised patient per Dr Owens Loffler that she has pneumonia and Dr Owens Loffler would like her to have a repeat chest x-ray next Monday or Tuesday.  Also advised patient that levaquin has been sent in to pharmacy and verified allergies and pharmacy.  Encouraged her to call back with a fever >100.  Patient will call back with any questions or concerns.

## 2013-11-17 NOTE — Telephone Encounter (Signed)
per 2nd pof pt needs cxr 3/30 or 3/31 and 2wk f/u. pt already on schedule for 2wk f/u. s/w pt re cxr and also confirmed 4/10 appts. cxr walkin appt.

## 2013-11-18 ENCOUNTER — Encounter: Payer: Self-pay | Admitting: Hematology and Oncology

## 2013-11-18 NOTE — Progress Notes (Signed)
[facility]  OFFICE PROGRESS NOTE  CC  [name] Pardon, MD [facility] [address], [place] [address]  DIAGNOSIS: 74 year old female with anemia of chronic disease  PRIOR THERAPY:observation  CURRENT THERAPY:observation  INTERVAL HISTORY: [name] [name] 73 y.o. female returns for followup visit today of her anemia.Her record has been reviewed.The etiology is unclear,has been listed as anemia of chronic disease. Patient states she feels fairly well.She had URI/sinusitis for 3 weeks in 07/2013 and got treated with Z pack.  Due to  to generalized bone/joint aching  mostly neck shoulders and hands was placed on taper steroids start at 20 mg daily since December 2014,currently on 10 mg daily.She denies any fever.Polymyalgia rheumatica is listed as diagnosis.  Her cough which seemed to improve has now return and it is still non productive.She denies any fever,and she thinks it is due to allergies.She states she can carry a sinus infection all year around.She has increased sinus drainage.  Energy as usual,denies signs of bleeding.She had prior colonoscopy and endoscopy.She reports constipation alternating with loose BMs.She has abdominal pain not severe which starts after eating like stomach spasms.She has occasional hemorrhoidal bleeding.  Weight is up 10 pounds.  Patient has received prior blood transfusions due to significant anemia during orthopedic surgery.Also has received IV Iron before.  Patient denies signs of bleeding,denies urinary complains.    Remainder of the 10 point review of systems is negative.  MEDICAL HISTORY: Past Medical History  Diagnosis Date  . Hx of skin cancer, basal cell 2013 and 2012  . Psoriasis   . Depression   . Anxiety   . Hypertension   . Arthritis     osteoarthritis  . Peptic ulcer disease     H pylori  . Fibrocystic breast   . Osteopenia   . Anemia     of chronic disease  . Complication of anesthesia 1980 and 1982    trouble  waking up after breast biopsy    ALLERGIES:  is allergic to ace inhibitors; amoxicillin-pot clavulanate; oxaprozin; pregabalin; and sulfonamide derivatives.  MEDICATIONS:  Current Outpatient Prescriptions  Medication Sig Dispense Refill  . atorvastatin (LIPITOR) 10 MG tablet Take 1 tablet (10 mg total) by mouth every evening.  90 tablet  0  . busPIRone (BUSPAR) 15 MG tablet TAKE 1/2 TO 1 TABLET BY MOUTH TWICE A DAY  180 tablet  0  . Calcium-Vitamin D (CALTRATE 600 PLUS-VIT D PO) Take 1 tablet by mouth 2 (two) times daily.      [facility] esomeprazole (NEXIUM) 40 MG capsule TAKE 1 CAPSULE (40 MG TOTAL) BY MOUTH DAILY BEFORE BREAKFAST.  90 capsule  0  . hyoscyamine (LEVSIN, ANASPAZ) 0.125 MG tablet TAKE 1 TABLET BY MOUTH AS NEEDED.  30 tablet  4  . levofloxacin (LEVAQUIN) 500 MG tablet Take 1 tablet (500 mg total) by mouth daily.  10 tablet  0  . losartan-hydrochlorothiazide (HYZAAR) 100-25 MG per tablet TAKE 1 TABLET BY MOUTH DAILY.  90 tablet  0  . oxymetazoline (AFRIN) 0.05 % nasal spray Place 2 sprays into the nose 2 (two) times daily as needed for congestion.      . predniSONE (DELTASONE) 10 MG tablet Take by mouth as directed  60 tablet  1  . traMADol (ULTRAM) 50 MG tablet        No current facility-administered medications for this visit.    SURGICAL HISTORY:  Past Surgical History  Procedure Laterality Date  . Knee surgery  x3  . Nasal sinus surgery    . Esophagogastroduodenoscopy  03/2009    ulcer,HP, GERD stricture  . Spine surgery      back surgery x 3  . Spine surgery  2005    deg. disk lumber spine   . Breast surgery    . Abdominal hysterectomy      partial in 1970s  . Cholecystectomy  2005  . Hip arthroplasty Left 2010  . Joint replacement Right 2009    knee  . Breast biopsy Left 1980 and 1982  . Total hip arthroplasty Right 04/04/2013    Procedure: RIGHT TOTAL HIP ARTHROPLASTY ANTERIOR APPROACH;  Surgeon: Mauri Pole, MD;  Location: [facility];  Service: Orthopedics;   Laterality: Right;    REVIEW OF SYSTEMS:  Pertinent items are noted in HPI.   PHYSICAL EXAMINATION: General appearance: alert, cooperative and appears stated age Head: Normocephalic, without obvious abnormality, atraumatic Neck: no adenopathy, no carotid bruit, no JVD, supple, symmetrical, trachea midline and thyroid not enlarged, symmetric, no tenderness/mass/nodules Lymph nodes: Cervical, supraclavicular, and axillary nodes normal. Resp: clear to auscultation bilaterally and normal percussion bilaterally Back: symmetric, no curvature. ROM normal. No CVA tenderness. Cardio: regular rate and rhythm, S1, S2 normal, no murmur, click, rub or gallop GI: soft, non-tender; bowel sounds normal; no masses,  no organomegaly Extremities: extremities normal, atraumatic, no cyanosis or edema Neurologic: Alert and oriented X 3, normal strength and tone. Normal symmetric reflexes. Normal coordination and gait  ECOG PERFORMANCE STATUS: 1 - Symptomatic but completely ambulatory  Blood pressure 136/75, pulse 102, temperature 98.3 F (36.8 C), temperature source Oral, resp. rate 18, height _0  (1.626 m), weight 196 lb 4.8 oz (89.041 kg).  LABORATORY DATA: Lab Results  Component Value Date   WBC 13.4* 11/17/2013   HGB 10.7* 11/17/2013   HCT 32.3* 11/17/2013   MCV 88.5 11/17/2013   PLT 274 11/17/2013     RADIOGRAPHIC STUDIES:  No results found.  ASSESSMENT: 73 year old female with anemia of chronic disease                             Leucocytosis  PLAN: 1.Further evaluation of anemia with Retic count,LDH,ANA,SPEP with IFE with QIG.  Patient with anemia and bony/arthritic complains on steroids.Plasma cell dysracia,autoimmune process  In the differential.  Ferritin has been normal previously and no signs of blood loss on Colonoscopy/endoscopy. Ferritin,Erythropoeitin level pending.Iron studies pending. Prior ESR elevated  2.Leucocytosis  WBC has been normal previously.  Could be due to  ongoing infection or due to steroids.  Patient asymptomatic but will check UA due to  Increased WBC?UTI or to look for signs of blood  Will order chest xray due to the persistent cough.  Will need follow up next week   All questions were answered. The patient knows to call the clinic with any problems, questions or concerns. We can certainly see the patient much sooner if necessary.  I spent 15 minutes counseling the patient face to face. The total time spent in the appointment was 30 minutes.    Amada Kingfisher, M.D. Oncology/Hematology [facility] [phone] (Office)  11/18/2013, 1:25 PM     chest xray today indicates right lung infiltrate cannot be excluded will treat with Levaquin 500 mg one daily for 10 days,order chest xray for next week. Need to call for fever or dyspnea. Clinic nurse tells me that to get sputum culture induced will need Pulmonary evaluation.  UA nitrites positive leucocyte esterace  Trace ordered urine culture. Will follow up with patient next week.

## 2013-11-19 LAB — URINE CULTURE

## 2013-11-20 ENCOUNTER — Other Ambulatory Visit: Payer: Self-pay

## 2013-11-20 LAB — ERYTHROPOIETIN: Erythropoietin: 8.2 m[IU]/mL (ref 2.6–18.5)

## 2013-11-20 NOTE — Progress Notes (Unsigned)
Informed Dr.Khans nurse Dr.Faidas had want patient for repeat Xray on 11/20/13 or 11/21/13, POF was sent, no order was placed. Asked for clarification for Xray to be ordered. Dr.Khan would not like repeat  Xray to be done at this time per [name] [name].

## 2013-11-21 ENCOUNTER — Telehealth: Payer: Self-pay

## 2013-11-21 ENCOUNTER — Other Ambulatory Visit: Payer: Self-pay

## 2013-11-21 LAB — SPEP & IFE WITH QIG
ALBUMIN ELP: 55.4 % — AB (ref 55.8–66.1)
Alpha-1-Globulin: 6.9 % — ABNORMAL HIGH (ref 2.9–4.9)
Alpha-2-Globulin: 12.8 % — ABNORMAL HIGH (ref 7.1–11.8)
BETA GLOBULIN: 7.2 % (ref 4.7–7.2)
Beta 2: 5.4 % (ref 3.2–6.5)
Gamma Globulin: 12.3 % (ref 11.1–18.8)
IGM, SERUM: 71 mg/dL (ref 52–322)
IgA: 221 mg/dL (ref 69–380)
IgG (Immunoglobin G), Serum: 828 mg/dL (ref 690–1700)
Total Protein, Serum Electrophoresis: 6.9 g/dL (ref 6.0–8.3)

## 2013-11-21 LAB — ANA: Anti Nuclear Antibody(ANA): NEGATIVE

## 2013-11-21 LAB — VITAMIN B12: Vitamin B-12: 441 pg/mL (ref 211–911)

## 2013-11-21 NOTE — Telephone Encounter (Signed)
Patient called to inquire about the Xray Dr.Faidas had discussed for patient to do, patient stated she called to see about the appointment but no orders were in. Informed patient the order was discussed with Dr.Khan and Dr.Khan does not feel a second Xray is necessary at this time. Patient verbalized understanding. Patient requested for this nurse to call radiology to inform them she wasn't going to be coming today. Patient denies any further questions at this time. Patient informed to call back with any other questions or concerns. Called radiology and informed [name], [name] was not going to have an XRay ordered for today and patient was informed.

## 2013-11-22 ENCOUNTER — Telehealth: Payer: Self-pay | Admitting: Oncology

## 2013-11-22 NOTE — Telephone Encounter (Signed)
, °

## 2013-11-23 ENCOUNTER — Ambulatory Visit (INDEPENDENT_AMBULATORY_CARE_PROVIDER_SITE_OTHER): Payer: Medicare Other | Admitting: Family Medicine

## 2013-11-23 ENCOUNTER — Encounter: Payer: Self-pay | Admitting: Family Medicine

## 2013-11-23 ENCOUNTER — Telehealth: Payer: Self-pay | Admitting: Oncology

## 2013-11-23 VITALS — BP 126/70 | HR 97 | Temp 98.1°F | Ht 64.0 in | Wt 195.5 lb

## 2013-11-23 DIAGNOSIS — J189 Pneumonia, unspecified organism: Secondary | ICD-10-CM

## 2013-11-23 MED ORDER — CEFDINIR 300 MG PO CAPS: 600.0000 mg | ORAL_CAPSULE | Freq: Every day | ORAL | Status: DC

## 2013-11-23 NOTE — Telephone Encounter (Signed)
Charting error.

## 2013-11-23 NOTE — Telephone Encounter (Signed)
, °

## 2013-11-23 NOTE — Progress Notes (Signed)
Pre visit review using our clinic review tool, if applicable. No additional management support is needed unless otherwise documented below in the visit note. 

## 2013-11-23 NOTE — Progress Notes (Signed)
Date:  11/23/2013   Name:  [name] [name]   DOB:  1941/06/18   MRN:  [id number] Gender: female Age: 73 y.o.  Primary Physician:  [name], MD   Chief Complaint: Sinusitis and Cough   Subjective:   History of Present Illness:  [name] [name] is a 73 y.o. very pleasant female patient who presents with the following:  Elderly female who appears older than her stated age recently was seen by her hematology oncology consult, and at that point she was not feeling well, and her workup included a chest x-ray which was consistent with a possible RIGHT middle lobe pneumonia. Therefore, they placed her on Levaquin. The patient tells me she has a runny nose and congestion as well as cough. She does not feel any better at all, and thinks that she mentioned a little bit worse. She remains afebrile. She is coughing quite a bit.  She is on prednisone chronically now for about 5 months. 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  Maxillary sinus pain Feels like in a tunnel Some pain in the ears.   Coughing a lot.  Coughed all night long last   Pred. 20 mg starting in December, then tapering off.   Cough? pna on cxr   Past Medical History, Surgical History, Social History, Family History, Problem List, Medications, and Allergies have been reviewed and updated if relevant.  Review of Systems: ROS: GEN: Acute illness details above GI: Tolerating PO intake GU: maintaining adequate hydration and urination Pulm: No SOB Interactive and getting along well at home.  Otherwise, ROS is as per the HPI.   Objective:   Physical Examination: BP 126/70  Pulse 97  Temp(Src) 98.1 F (36.7 C) (Oral)  Ht 5\' 4"  (1.626 m)  Wt 195 lb 8 oz (88.678 kg)  BMI 33.54 kg/m2  SpO2 97%   GEN: A and O x 3. WDWN. NAD.    ENT: Nose clear, ext NML.  No LAD.  No JVD.  TM's clear. Oropharynx clear.  PULM: Normal WOB, no distress. No crackles, wheezes, rhonchi. CV: RRR, no M/G/R, No rubs, No JVD.     EXT: warm and well-perfused, No c/c/e. PSYCH: Pleasant and conversant.    Laboratory and Imaging Data: Dg Chest 2 View  11/17/2013   CLINICAL DATA:  Cough.  EXAM: CHEST  2 VIEW  COMPARISON:  DG CHEST 2 VIEW dated 03/28/2013  FINDINGS: Mediastinum and hilar structures are normal. Mild right middle lobe infiltrate cannot be excluded. Lungs are otherwise clear. No pleural effusion or pneumothorax. Heart size and pulmonary vascularity stable. No acute bony abnormality.  IMPRESSION: Mild right middle lobe infiltrate cannot be excluded.   Electronically Signed   By: Marcello Moores  Register   On: 11/17/2013 14:11    Assessment & Plan:   CAP (community acquired pneumonia)  I am not convinced this patient has a bacterial pneumonia. Clinically she is afebrile, she is oxygenating normally at 97 percent, she has normal respirations, and her lungs exam is clear. More likely, suspect that she has a viral infection with upper respiratory symptoms and some viral bronchitis. Given her overall state of health, her prior lung x-ray, and relative immunocompromise state on chronic prednisone, think that we do need to cover her with antibiotics, and then to place her on some Omnicef. D/C Levaquin, and change coverage somewhat.   Patient's Medications  New Prescriptions   CEFDINIR (OMNICEF) 300 MG CAPSULE    Take 2 capsules (600 mg total) by mouth  daily.  Previous Medications   ATORVASTATIN (LIPITOR) 10 MG TABLET    Take 1 tablet (10 mg total) by mouth every evening.   BUSPIRONE (BUSPAR) 15 MG TABLET    TAKE 1/2 TO 1 TABLET BY MOUTH TWICE A DAY   CALCIUM-VITAMIN D (CALTRATE 600 PLUS-VIT D PO)    Take 1 tablet by mouth 2 (two) times daily.   ESOMEPRAZOLE (NEXIUM) 40 MG CAPSULE    TAKE 1 CAPSULE (40 MG TOTAL) BY MOUTH DAILY BEFORE BREAKFAST.   HYOSCYAMINE (LEVSIN, ANASPAZ) 0.125 MG TABLET    TAKE 1 TABLET BY MOUTH AS NEEDED.   LOSARTAN-HYDROCHLOROTHIAZIDE (HYZAAR) 100-25 MG PER TABLET    TAKE 1 TABLET BY MOUTH DAILY.    OXYMETAZOLINE (AFRIN) 0.05 % NASAL SPRAY    Place 2 sprays into the nose 2 (two) times daily as needed for congestion.   PREDNISONE (DELTASONE) 10 MG TABLET    Take by mouth as directed   TRAMADOL (ULTRAM) 50 MG TABLET      Modified Medications   No medications on file  Discontinued Medications   LEVOFLOXACIN (LEVAQUIN) 500 MG TABLET    Take 1 tablet (500 mg total) by mouth daily.   There are no Patient Instructions on file for this visit.  Signed,  [name] Deed. Denarius Sesler, MD, [place] at [facility] [place] [address] Phone: [phone] Fax: [phone]

## 2013-12-01 ENCOUNTER — Other Ambulatory Visit: Payer: Medicare Other

## 2013-12-01 ENCOUNTER — Ambulatory Visit: Payer: Medicare Other

## 2013-12-05 ENCOUNTER — Encounter: Payer: Self-pay | Admitting: Oncology

## 2013-12-05 ENCOUNTER — Ambulatory Visit (HOSPITAL_BASED_OUTPATIENT_CLINIC_OR_DEPARTMENT_OTHER): Payer: Medicare Other | Admitting: Oncology

## 2013-12-05 ENCOUNTER — Other Ambulatory Visit: Payer: Self-pay | Admitting: Family Medicine

## 2013-12-05 VITALS — BP 150/63 | HR 101 | Temp 98.4°F | Resp 18 | Ht 64.0 in | Wt 197.3 lb

## 2013-12-05 DIAGNOSIS — D638 Anemia in other chronic diseases classified elsewhere: Secondary | ICD-10-CM

## 2013-12-05 NOTE — Progress Notes (Signed)
[facility] OFFICE PROGRESS NOTE  Patient Care Team: Abner Greenspan, MD as PCP - General  DIAGNOSIS: 73 year old female with history of anemia of chronic disease on observation only  SUMMARY OF ONCOLOGIC HISTORY: Observation  INTERVAL HISTORY: [name] [name] 74 y.o. female returns for followup visit today. She was last seen by one of my partners/[facility]. At that time she had an upper respiratory tract infection possibly a pneumonia. She was started on oral antibiotics had a chest x-ray done. We discussed the results of this today. She also had multiple other laboratory data performed. I have gone over this with her detail. Basically everything patient had what sounds like possibly a bronchitis/walking pneumonia. Since her last visit she has been seen by her primary care doctor. Who extended the course of antibiotics. Patient today tells me that she is feeling much better. She is really without any other complaints. She is gong to be seeing her primary doctor back again in a few days. I've encouraged her to continue to be followed.  I have reviewed the past medical history, past surgical history, social history and family history with the patient and they are unchanged from previous note.  ALLERGIES:  is allergic to ace inhibitors; amoxicillin-pot clavulanate; oxaprozin; pregabalin; and sulfonamide derivatives.  MEDICATIONS:  Current Outpatient Prescriptions  Medication Sig Dispense Refill  . atorvastatin (LIPITOR) 10 MG tablet Take 1 tablet (10 mg total) by mouth every evening.  90 tablet  0  . busPIRone (BUSPAR) 15 MG tablet TAKE 1/2 TO 1 TABLET BY MOUTH TWICE A DAY  180 tablet  0  . Calcium-Vitamin D (CALTRATE 600 PLUS-VIT D PO) Take 1 tablet by mouth 2 (two) times daily.      Marland Kitchen esomeprazole (NEXIUM) 40 MG capsule TAKE 1 CAPSULE (40 MG TOTAL) BY MOUTH DAILY BEFORE BREAKFAST.  90 capsule  0  . hyoscyamine (LEVSIN, ANASPAZ) 0.125 MG tablet TAKE 1 TABLET BY MOUTH AS NEEDED.   30 tablet  4  . losartan-hydrochlorothiazide (HYZAAR) 100-25 MG per tablet TAKE 1 TABLET BY MOUTH DAILY.  90 tablet  0  . Melatonin 3 MG CAPS Take 6 mg by mouth at bedtime.      Marland Kitchen oxymetazoline (AFRIN) 0.05 % nasal spray Place 2 sprays into the nose 2 (two) times daily as needed for congestion.      . predniSONE (DELTASONE) 10 MG tablet Take by mouth as directed  60 tablet  1  . traMADol (ULTRAM) 50 MG tablet       . cefdinir (OMNICEF) 300 MG capsule Take 2 capsules (600 mg total) by mouth daily.  20 capsule  0   No current facility-administered medications for this visit.    REVIEW OF SYSTEMS:   Constitutional: Denies fevers, chills or abnormal weight loss Eyes: Denies blurriness of vision Ears, nose, mouth, throat, and face: Denies mucositis or sore throat Respiratory: Denies cough, dyspnea or wheezes Cardiovascular: Denies palpitation, chest discomfort or lower extremity swelling Gastrointestinal:  Denies nausea, heartburn or change in bowel habits Skin: Denies abnormal skin rashes Lymphatics: Denies new lymphadenopathy or easy bruising Neurological:Denies numbness, tingling or new weaknesses Behavioral/Psych: Mood is stable, no new changes  All other systems were reviewed with the patient and are negative.  PHYSICAL EXAMINATION: ECOG PERFORMANCE STATUS: 1 - Symptomatic but completely ambulatory  Filed Vitals:   12/05/13 0924  BP: 150/63  Pulse: 101  Temp: 98.4 F (36.9 C)  Resp: 18   Filed Weights   12/05/13 0924  Weight: 197 lb 4.8 oz (89.495 kg)    GENERAL:alert, no distress and comfortable SKIN: skin color, texture, turgor are normal, no rashes or significant lesions EYES: normal, Conjunctiva are pink and non-injected, sclera clear OROPHARYNX:no exudate, no erythema and lips, buccal mucosa, and tongue normal  NECK: supple, thyroid normal size, non-tender, without nodularity LYMPH:  no palpable lymphadenopathy in the cervical, axillary or inguinal LUNGS: clear to  auscultation and percussion with normal breathing effort HEART: regular rate & rhythm and no murmurs and no lower extremity edema ABDOMEN:abdomen soft, non-tender and normal bowel sounds Musculoskeletal:no cyanosis of digits and no clubbing  NEURO: alert & oriented x 3 with fluent speech, no focal motor/sensory deficits  LABORATORY DATA:  I have reviewed the data as listed    Component Value Date/Time   NA 135* 11/17/2013 1237   NA 132* 04/07/2013 0820   NA 141 02/28/2009 1250   K 4.0 11/17/2013 1237   K 3.9 04/07/2013 0820   K 4.0 02/28/2009 1250   CL 95* 04/07/2013 0820   CL 98 02/28/2009 1250   CO2 23 11/17/2013 1237   CO2 31 04/07/2013 0820   CO2 26 02/28/2009 1250   GLUCOSE 120 11/17/2013 1237   GLUCOSE 99 04/07/2013 0820   GLUCOSE 103 02/28/2009 1250   BUN 17.3 11/17/2013 1237   BUN 7 04/07/2013 0820   BUN 12 02/28/2009 1250   CREATININE 1.1 11/17/2013 1237   CREATININE 0.79 04/07/2013 0820   CREATININE 0.7 02/28/2009 1250   CALCIUM 9.4 11/17/2013 1237   CALCIUM 9.2 04/07/2013 0820   CALCIUM 9.1 02/28/2009 1250   PROT 7.2 11/17/2013 1237   PROT 7.3 05/25/2012 0941   PROT 7.0 02/28/2009 1250   ALBUMIN 3.6 11/17/2013 1237   ALBUMIN 3.7 05/25/2012 0941   AST 15 11/17/2013 1237   AST 22 05/25/2012 0941   AST 21 02/28/2009 1250   ALT 15 11/17/2013 1237   ALT 15 05/25/2012 0941   ALT 19 02/28/2009 1250   ALKPHOS 69 11/17/2013 1237   ALKPHOS 63 05/25/2012 0941   ALKPHOS 66 02/28/2009 1250   BILITOT 0.67 11/17/2013 1237   BILITOT 0.5 05/25/2012 0941   BILITOT 0.50 02/28/2009 1250   GFRNONAA 81* 04/07/2013 0820   GFRAA >90 04/07/2013 0820    No results found for this basename: SPEP, UPEP,  kappa and lambda light chains    Lab Results  Component Value Date   WBC 13.4* 11/17/2013   NEUTROABS 11.6* 11/17/2013   HGB 10.7* 11/17/2013   HCT 32.3* 11/17/2013   MCV 88.5 11/17/2013   PLT 274 11/17/2013      Chemistry      Component Value Date/Time   NA 135* 11/17/2013 1237   NA 132* 04/07/2013 0820   NA 141 02/28/2009  1250   K 4.0 11/17/2013 1237   K 3.9 04/07/2013 0820   K 4.0 02/28/2009 1250   CL 95* 04/07/2013 0820   CL 98 02/28/2009 1250   CO2 23 11/17/2013 1237   CO2 31 04/07/2013 0820   CO2 26 02/28/2009 1250   BUN 17.3 11/17/2013 1237   BUN 7 04/07/2013 0820   BUN 12 02/28/2009 1250   CREATININE 1.1 11/17/2013 1237   CREATININE 0.79 04/07/2013 0820   CREATININE 0.7 02/28/2009 1250      Component Value Date/Time   CALCIUM 9.4 11/17/2013 1237   CALCIUM 9.2 04/07/2013 0820   CALCIUM 9.1 02/28/2009 1250   ALKPHOS 69 11/17/2013 1237   ALKPHOS 63 05/25/2012 0941  ALKPHOS 66 02/28/2009 1250   AST 15 11/17/2013 1237   AST 22 05/25/2012 0941   AST 21 02/28/2009 1250   ALT 15 11/17/2013 1237   ALT 15 05/25/2012 0941   ALT 19 02/28/2009 1250   BILITOT 0.67 11/17/2013 1237   BILITOT 0.5 05/25/2012 0941   BILITOT 0.50 02/28/2009 1250       RADIOGRAPHIC STUDIES: I have personally reviewed the radiological images as listed and agreed with the findings in the report. No results found.    ASSESSMENT & PLAN:  73 year old female who has been followed in hematology oncology for ongoing history of anemia of chronic disease. She has never really required any kind of therapy for this. Recently seen on a routine followup where she was suffering from what sounds like tracheobronchitis possibly a walking pneumonia. She was worked up for this. She was also placed on oral antibiotics. She since then has improved significantly. She has ongoing follow with her primary care doctor. She was noted to have an elevated white count. I do think this may have been secondary to her acute infection. Clinically patient has improved quite a bit. She has not had any fevers or anything. At this time my recommendation is for her to be seen by her primary care doctor as needed. In the meantime I will set her up to see me back in 1 year or if needed sooner.  No orders of the defined types were placed in this encounter.   All questions were answered. The patient  knows to call the clinic with any problems, questions or concerns. No barriers to learning was detected. I spent 10 minutes counseling the patient face to face. The total time spent in the appointment was 15 minutes and more than 50% was on counseling and review of test results     Deatra Robinson, MD 12/05/2013 10:05 AM

## 2013-12-05 NOTE — Telephone Encounter (Signed)
Electronic refill request, Rx not on med list, please advise

## 2013-12-05 NOTE — Patient Instructions (Signed)
Doing well  Keep appointment with Dr. Glori Bickers  We will see you back in 1 year

## 2013-12-05 NOTE — Telephone Encounter (Signed)
She uses it intermittently-please refill times one, thanks

## 2013-12-06 NOTE — Telephone Encounter (Signed)
Ok to refill 

## 2013-12-06 NOTE — Telephone Encounter (Signed)
Will refill electronically  

## 2013-12-08 ENCOUNTER — Telehealth: Payer: Self-pay | Admitting: Oncology

## 2013-12-08 NOTE — Telephone Encounter (Signed)
, °

## 2013-12-13 ENCOUNTER — Telehealth: Payer: Self-pay

## 2013-12-13 MED ORDER — PREDNISONE 5 MG PO TABS: 7.5000 mg | ORAL_TABLET | Freq: Every day | ORAL | Status: DC

## 2013-12-13 NOTE — Telephone Encounter (Signed)
Go up to 7.5 mg (that would be 1 and 1/2 of the 5 mg pills) and let me know how you are feeling in a week  Thanks for letting me know

## 2013-12-13 NOTE — Telephone Encounter (Signed)
Pt left v/m; pt seen 11/07/13; pt has been taking prednisone 5 mg for 2 1/2 weeks and now pt experiencing muscle pain in upper legs and shoulder. Pain is not severe but not as comfortable as when taking larger doses of med. Pt wants to know what to do?Please advise.

## 2013-12-13 NOTE — Telephone Encounter (Signed)
Pt has 10mg  tabs of prednisone and she is cutting them in 1/2 to take 5mg  so pt wanted to know should she try to cut them in 1/4 tabs or should we just send in a new Rx, please advise

## 2013-12-13 NOTE — Telephone Encounter (Signed)
Spoke with Dr. Glori Bickers and we are sending in 5mg  tabs of prednisone with Sig of taking 1.5 tabs of prednisone. Rx sent and pt notified

## 2014-02-23 ENCOUNTER — Other Ambulatory Visit: Payer: Self-pay | Admitting: Family Medicine

## 2014-02-26 NOTE — Telephone Encounter (Signed)
Please refill all for 6 mo

## 2014-02-26 NOTE — Telephone Encounter (Signed)
Last OV with you was 11/07/13--Buspar last filled 10/20/13 #90 supply-please advise

## 2014-02-26 NOTE — Telephone Encounter (Signed)
Rx sent through e-scribe  

## 2014-07-30 ENCOUNTER — Encounter: Payer: Self-pay | Admitting: Family Medicine

## 2014-07-30 ENCOUNTER — Ambulatory Visit (INDEPENDENT_AMBULATORY_CARE_PROVIDER_SITE_OTHER): Payer: Medicare Other | Admitting: Family Medicine

## 2014-07-30 VITALS — BP 128/68 | HR 84 | Temp 98.6°F | Ht 64.0 in | Wt 199.5 lb

## 2014-07-30 DIAGNOSIS — M353 Polymyalgia rheumatica: Secondary | ICD-10-CM

## 2014-07-30 MED ORDER — PREDNISONE 20 MG PO TABS: 20.0000 mg | ORAL_TABLET | Freq: Every day | ORAL | Status: DC

## 2014-07-30 NOTE — Progress Notes (Signed)
Pre visit review using our clinic review tool, if applicable. No additional management support is needed unless otherwise documented below in the visit note. 

## 2014-07-30 NOTE — Assessment & Plan Note (Signed)
Recent exacerbation Start prednisone 20 mg for 2 wk and then go down to 10 mg  Rev side eff in detail Fu 1 mo

## 2014-07-30 NOTE — Progress Notes (Signed)
Subjective:    Patient ID: [name] [name], female    DOB: 02-17-1941, 73 y.o.   MRN: [id number]  HPI Here with muscle pain all over  Thinks PMR is back  Weaned off prednisone in march - and ESR got down to 18   Having a lot more pain -in upper body/shoulder girdle  Only severe for a couple of weeks (mild for a few months)  No new activity or exercise  Has been on and off low dose statin and she does not think that is the cause  (no imp with stopping it in the past)  Hated prednisone but it got her under control   No headache   Patient Active Problem List   Diagnosis Date Noted  . Unspecified deficiency anemia 11/17/2013  . Viral upper respiratory illness 08/14/2013  . Encounter for Medicare annual wellness exam 07/17/2013  . Muscle pain 07/17/2013  . PMR (polymyalgia rheumatica) 07/17/2013  . Intertrigo 04/19/2013  . Rectal bleeding 04/19/2013  . Expected blood loss anemia 04/05/2013  . Obese 04/05/2013  . 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 Conjunctivitis 03/06/2013  . IBS (irritable bowel syndrome) 05/30/2012  . ANEMIA OF CHRONIC DISEASE 04/16/2010  . ANXIETY 04/16/2010  . DYSPHAGIA 03/07/2009  . ESOPHAGEAL STRICTURE 03/04/2009  . GERD 03/04/2009  . UNSPECIFIED VITAMIN D DEFICIENCY 03/12/2008  . URINARY TRACT INFECTION, RECURRENT 08/07/2007  . OSTEOPENIA 02/21/2007  . FIBROCYSTIC BREAST DISEASE, HX OF 02/11/2007  . HYPERCHOLESTEROLEMIA WITH HIGH HDL 01/10/2007  . DEPRESSION 01/10/2007  . RETINAL VEIN OCCLUSION 01/10/2007  . HYPERTENSION 01/10/2007  . CAD 01/10/2007  . PEPTIC ULCER DISEASE 01/10/2007  . GALLSTONE PANCREATITIS 01/10/2007  . OSTEOARTHRITIS 01/10/2007  . SKIN CANCER, HX OF 01/10/2007   Past Medical History  Diagnosis Date  . Hx of skin cancer, basal cell 2013 and 2012  . Psoriasis   . Depression   . Anxiety   . Hypertension   . Arthritis     osteoarthritis  . Peptic ulcer disease     H pylori  . Fibrocystic breast   . Osteopenia   . Anemia      of chronic disease  . Complication of anesthesia 1980 and 1982    trouble waking up after breast biopsy   Past Surgical History  Procedure Laterality Date  . Knee surgery      x3  . Nasal sinus surgery    . Esophagogastroduodenoscopy  03/2009    ulcer,HP, GERD stricture  . Spine surgery      back surgery x 3  . Spine surgery  2005    deg. disk lumber spine   . Breast surgery    . Abdominal hysterectomy      partial in 1970s  . Cholecystectomy  2005  . Hip arthroplasty Left 2010  . Joint replacement Right 2009    knee  . Breast biopsy Left 1980 and 1982  . Total hip arthroplasty Right 04/04/2013    Procedure: RIGHT TOTAL HIP ARTHROPLASTY ANTERIOR APPROACH;  Surgeon: Mauri Pole, MD;  Location: [facility];  Service: Orthopedics;  Laterality: Right;   History  Substance Use Topics  . Smoking status: Never Smoker   . Smokeless tobacco: Never Used  . Alcohol Use: 0.0 oz/week    0 Not specified per week     Comment: occasional   Family History  Problem Relation Age of Onset  . Arthritis Mother   . Emphysema Mother   . Hyperlipidemia Mother   . Hypertension  Mother   . Heart disease Brother     CAD   Allergies  Allergen Reactions  . Ace Inhibitors     REACTION: cough  . Amoxicillin-Pot Clavulanate     REACTION: gi upset  . Oxaprozin     REACTION: unknown  . Pregabalin     REACTION: swelling  . Sulfonamide Derivatives     REACTION: UNKNOWN   Current Outpatient Prescriptions on File Prior to Visit  Medication Sig Dispense Refill  . atorvastatin (LIPITOR) 10 MG tablet Take 1 tablet (10 mg total) by mouth every evening. 90 tablet 0  . busPIRone (BUSPAR) 15 MG tablet TAKE 1/2 TO 1 TABLET BY MOUTH TWICE A DAY 180 tablet 1  . Calcium-Vitamin D (CALTRATE 600 PLUS-VIT D PO) Take 1 tablet by mouth 2 (two) times daily.    . hyoscyamine (LEVSIN, ANASPAZ) 0.125 MG tablet TAKE 1 TABLET BY MOUTH AS NEEDED. 30 tablet 4  . losartan-hydrochlorothiazide (HYZAAR) 100-25 MG per  tablet TAKE 1 TABLET BY MOUTH DAILY. 90 tablet 1  . Melatonin 3 MG CAPS Take 6 mg by mouth at bedtime.    Marland Kitchen NEXIUM 40 MG capsule TAKE 1 CAPSULE (40 MG TOTAL) BY MOUTH DAILY BEFORE BREAKFAST. 90 capsule 1  . oxymetazoline (AFRIN) 0.05 % nasal spray Place 2 sprays into the nose 2 (two) times daily as needed for congestion.    . traMADol (ULTRAM) 50 MG tablet      No current facility-administered medications on file prior to visit.      Review of Systems Review of Systems  Constitutional: Negative for fever, appetite change, fatigue and unexpected weight change.  Eyes: Negative for pain and visual disturbance.  Respiratory: Negative for cough and shortness of breath.   Cardiovascular: Negative for cp or palpitations    Gastrointestinal: Negative for nausea, diarrhea and constipation.  Genitourinary: Negative for urgency and frequency.  Skin: Negative for pallor or rash   MSK pos for muscle pain - worse in upper half of body/ neg for weakness or joint swelling, pos for OA  Neurological: Negative for weakness, light-headedness, numbness and headaches.  Hematological: Negative for adenopathy. Does not bruise/bleed easily.  Psychiatric/Behavioral: Negative for dysphoric mood. The patient is not nervous/anxious.         Objective:   Physical Exam  Constitutional: She appears well-developed and well-nourished. No distress.  obese and well appearing   HENT:  Head: Normocephalic and atraumatic.  Eyes: Conjunctivae and EOM are normal. Pupils are equal, round, and reactive to light. No scleral icterus.  Neck: Normal range of motion. Neck supple. No JVD present. Carotid bruit is not present. No thyromegaly present.  Cardiovascular: Normal rate, regular rhythm, normal heart sounds and intact distal pulses.  Exam reveals no gallop.   Pulmonary/Chest: Effort normal and breath sounds normal. No respiratory distress. She has no wheezes. She has no rales.  Musculoskeletal: She exhibits tenderness.  She exhibits no edema.  Diffuse tenderness of shoulder girdle/ upper extremities, neck, chest wall and quadriceps   Lymphadenopathy:    She has no cervical adenopathy.  Neurological: She is alert. She has normal reflexes. No cranial nerve deficit. She exhibits normal muscle tone. Coordination normal.  Skin: Skin is warm and dry. No rash noted. No erythema. No pallor.  Psychiatric: She has a normal mood and affect.          Assessment & Plan:   Problem List Items Addressed This Visit      Other   PMR (polymyalgia rheumatica) -  Primary    Recent exacerbation Start prednisone 20 mg for 2 wk and then go down to 10 mg  Rev side eff in detail Fu 1 mo

## 2014-07-30 NOTE — Patient Instructions (Signed)
Start prednisone 20 mg daily for 2 weeks (let me know if no improvement in pain)  Then cut to 1/2 pill daily (10 mg)  Follow up with me in about 1 month   Stay as active as you can be and take care of yourself

## 2014-08-07 ENCOUNTER — Telehealth: Payer: Self-pay | Admitting: Hematology and Oncology

## 2014-08-07 NOTE — Telephone Encounter (Signed)
, °

## 2014-08-08 ENCOUNTER — Encounter: Payer: Self-pay | Admitting: Family Medicine

## 2014-08-23 ENCOUNTER — Other Ambulatory Visit: Payer: Self-pay | Admitting: Family Medicine

## 2014-08-24 ENCOUNTER — Other Ambulatory Visit: Payer: Self-pay | Admitting: Family Medicine

## 2014-08-27 ENCOUNTER — Telehealth: Payer: Self-pay | Admitting: Family Medicine

## 2014-08-27 NOTE — Telephone Encounter (Signed)
Go down to 5 mg  Follow up with me in about 2 weeks please

## 2014-08-27 NOTE — Telephone Encounter (Signed)
Patient advised.   Patient will call back for 2 week follow up appt, she was in the car at that time.

## 2014-08-27 NOTE — Telephone Encounter (Signed)
Patient was told to call back and let you know how she's doing.  Patient said the pain is considerably better with 10 mg of prednisone.  Patient wants to know if you want her to continue on the 10 mg, or cut it to 5 mg.  She, also, wants to know if you want her to make an appointment to see you.

## 2014-08-31 ENCOUNTER — Other Ambulatory Visit: Payer: Self-pay | Admitting: Family Medicine

## 2014-08-31 NOTE — Telephone Encounter (Signed)
Please refill both for a year  

## 2014-08-31 NOTE — Telephone Encounter (Signed)
Electronic refill request, please advise  

## 2014-08-31 NOTE — Telephone Encounter (Signed)
done

## 2014-11-10 ENCOUNTER — Other Ambulatory Visit: Payer: Self-pay | Admitting: Family Medicine

## 2014-12-07 ENCOUNTER — Other Ambulatory Visit: Payer: Self-pay | Admitting: *Deleted

## 2014-12-07 DIAGNOSIS — D539 Nutritional anemia, unspecified: Secondary | ICD-10-CM

## 2014-12-10 ENCOUNTER — Ambulatory Visit (HOSPITAL_BASED_OUTPATIENT_CLINIC_OR_DEPARTMENT_OTHER): Payer: Medicare Other | Admitting: Hematology and Oncology

## 2014-12-10 ENCOUNTER — Telehealth: Payer: Self-pay | Admitting: Hematology and Oncology

## 2014-12-10 ENCOUNTER — Other Ambulatory Visit (HOSPITAL_BASED_OUTPATIENT_CLINIC_OR_DEPARTMENT_OTHER): Payer: Medicare Other

## 2014-12-10 VITALS — BP 159/63 | HR 93 | Temp 98.0°F | Resp 18 | Ht 64.0 in | Wt 201.9 lb

## 2014-12-10 DIAGNOSIS — M353 Polymyalgia rheumatica: Secondary | ICD-10-CM

## 2014-12-10 DIAGNOSIS — D638 Anemia in other chronic diseases classified elsewhere: Secondary | ICD-10-CM

## 2014-12-10 DIAGNOSIS — D539 Nutritional anemia, unspecified: Secondary | ICD-10-CM

## 2014-12-10 LAB — CBC WITH DIFFERENTIAL/PLATELET
BASO%: 0.5 % (ref 0.0–2.0)
BASOS ABS: 0 10*3/uL (ref 0.0–0.1)
EOS ABS: 0.1 10*3/uL (ref 0.0–0.5)
EOS%: 1.1 % (ref 0.0–7.0)
HEMATOCRIT: 32.2 % — AB (ref 34.8–46.6)
HEMOGLOBIN: 10.5 g/dL — AB (ref 11.6–15.9)
LYMPH#: 1.1 10*3/uL (ref 0.9–3.3)
LYMPH%: 17.7 % (ref 14.0–49.7)
MCH: 29.2 pg (ref 25.1–34.0)
MCHC: 32.6 g/dL (ref 31.5–36.0)
MCV: 89.7 fL (ref 79.5–101.0)
MONO#: 0.5 10*3/uL (ref 0.1–0.9)
MONO%: 7 % (ref 0.0–14.0)
NEUT#: 4.8 10*3/uL (ref 1.5–6.5)
NEUT%: 73.7 % (ref 38.4–76.8)
Platelets: 269 10*3/uL (ref 145–400)
RBC: 3.59 10*6/uL — ABNORMAL LOW (ref 3.70–5.45)
RDW: 13 % (ref 11.2–14.5)
WBC: 6.5 10*3/uL (ref 3.9–10.3)

## 2014-12-10 LAB — COMPREHENSIVE METABOLIC PANEL (CC13)
ALT: 13 U/L (ref 0–55)
AST: 18 U/L (ref 5–34)
Albumin: 3.5 g/dL (ref 3.5–5.0)
Alkaline Phosphatase: 61 U/L (ref 40–150)
Anion Gap: 12 mEq/L — ABNORMAL HIGH (ref 3–11)
BILIRUBIN TOTAL: 0.47 mg/dL (ref 0.20–1.20)
BUN: 15.7 mg/dL (ref 7.0–26.0)
CHLORIDE: 104 meq/L (ref 98–109)
CO2: 25 mEq/L (ref 22–29)
Calcium: 9.2 mg/dL (ref 8.4–10.4)
Creatinine: 0.9 mg/dL (ref 0.6–1.1)
EGFR: 59 mL/min/{1.73_m2} — ABNORMAL LOW (ref >90)
Glucose: 104 mg/dl (ref 70–140)
Potassium: 3.7 mEq/L (ref 3.5–5.1)
Sodium: 141 mEq/L (ref 136–145)
Total Protein: 6.8 g/dL (ref 6.4–8.3)

## 2014-12-10 NOTE — Assessment & Plan Note (Signed)
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 ( anemia of chronic inflammation) I reviewed her blood counts with her. There is no indication for treatment because she is asymptomatic with a hemoglobin greater than 10 g. Return to clinic in 1 year for follow-up

## 2014-12-10 NOTE — Progress Notes (Signed)
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 She has a diagnosis of polymyalgia rheumatica which causes her to have diffuse aches and pains throughout her joints and muscles. She usually gets prednisone and that would resolve her symptoms but later on down the line her symptoms have returned.  REVIEW OF SYSTEMS:   Constitutional: Denies fevers, chills or abnormal weight loss Eyes: Denies blurriness of vision Ears, nose, mouth, throat, and face: Denies mucositis or sore throat Respiratory: Denies cough, dyspnea or wheezes Cardiovascular: Denies palpitation, chest discomfort or lower extremity swelling Gastrointestinal:  Denies nausea, heartburn or change in bowel habits Skin: Denies abnormal skin rashes Lymphatics: Denies new lymphadenopathy or easy bruising Neurological:Denies numbness, tingling or new weaknesses Behavioral/Psych: Mood is stable, no new changes  Breast:  denies any pain or lumps or nodules in either breasts All other systems were reviewed with the patient and are negative.  I have reviewed the past medical history, past surgical history, social history and family history with the patient and they are unchanged from previous note.  ALLERGIES:  is allergic to ace inhibitors; amoxicillin-pot clavulanate; oxaprozin; pregabalin; and sulfonamide derivatives.  MEDICATIONS:  Current Outpatient Prescriptions  Medication Sig Dispense Refill  . atorvastatin (LIPITOR) 10 MG tablet Take 1 tablet (10 mg total) by mouth daily. *Please schedule a follow up appointment with Dr Glori Bickers for refills. 30 tablet 1  . busPIRone (BUSPAR) 15 MG tablet TAKE 1/2 TO 1 TABLET BY MOUTH TWICE A DAY 180 tablet 3   . Calcium-Vitamin D (CALTRATE 600 PLUS-VIT D PO) Take 1 tablet by mouth 2 (two) times daily.    . hyoscyamine (LEVSIN, ANASPAZ) 0.125 MG tablet TAKE 1 TABLET BY MOUTH AS NEEDED. 30 tablet 4  . losartan-hydrochlorothiazide (HYZAAR) 100-25 MG per tablet TAKE 1 TABLET BY MOUTH DAILY. 90 tablet 3  . Melatonin 3 MG CAPS Take 6 mg by mouth at bedtime.    Marland Kitchen NEXIUM 40 MG capsule TAKE 1 CAPSULE (40 MG TOTAL) BY MOUTH DAILY BEFORE BREAKFAST. 90 capsule 0  . predniSONE (DELTASONE) 20 MG tablet Take 1 tablet (20 mg total) by mouth daily. 30 tablet 1  . traMADol (ULTRAM) 50 MG tablet     . oxymetazoline (AFRIN) 0.05 % nasal spray Place 2 sprays into the nose 2 (two) times daily as needed for congestion.     No current facility-administered medications for this visit.    PHYSICAL EXAMINATION: ECOG PERFORMANCE STATUS: 1 - Symptomatic but completely ambulatory  Filed Vitals:   12/10/14 1027  BP: 159/63  Pulse: 93  Temp: 98 F (36.7 C)  Resp: 18   Filed Weights   12/10/14 1027  Weight: 201 lb 14.4 oz (91.581 kg)    GENERAL:alert, no distress and comfortable SKIN: skin color, texture, turgor are normal, no rashes or significant lesions EYES: normal, Conjunctiva are pink and non-injected, sclera clear OROPHARYNX:no exudate, no erythema and lips, buccal mucosa, and tongue normal  NECK: supple, thyroid normal size, non-tender, without nodularity LYMPH:  no palpable lymphadenopathy in the cervical, axillary or inguinal LUNGS: clear to auscultation and percussion with normal breathing effort HEART: regular rate & rhythm and no murmurs and no lower extremity edema ABDOMEN:abdomen soft, non-tender and normal bowel sounds Musculoskeletal: Muscle and joint  pains  NEURO: alert & oriented x 3 with fluent speech, no focal motor/sensory deficits  LABORATORY DATA:  I have reviewed the data as listed   Chemistry      Component Value Date/Time   NA 135* 11/17/2013 1237   NA 132* 04/07/2013 0820   NA  141 02/28/2009 1250   K 4.0 11/17/2013 1237   K 3.9 04/07/2013 0820   K 4.0 02/28/2009 1250   CL 95* 04/07/2013 0820   CL 98 02/28/2009 1250   CO2 23 11/17/2013 1237   CO2 31 04/07/2013 0820   CO2 26 02/28/2009 1250   BUN 17.3 11/17/2013 1237   BUN 7 04/07/2013 0820   BUN 12 02/28/2009 1250   CREATININE 1.1 11/17/2013 1237   CREATININE 0.79 04/07/2013 0820   CREATININE 0.7 02/28/2009 1250      Component Value Date/Time   CALCIUM 9.4 11/17/2013 1237   CALCIUM 9.2 04/07/2013 0820   CALCIUM 9.1 02/28/2009 1250   ALKPHOS 69 11/17/2013 1237   ALKPHOS 63 05/25/2012 0941   ALKPHOS 66 02/28/2009 1250   AST 15 11/17/2013 1237   AST 22 05/25/2012 0941   AST 21 02/28/2009 1250   ALT 15 11/17/2013 1237   ALT 15 05/25/2012 0941   ALT 19 02/28/2009 1250   BILITOT 0.67 11/17/2013 1237   BILITOT 0.5 05/25/2012 0941   BILITOT 0.50 02/28/2009 1250       Lab Results  Component Value Date   WBC 6.5 12/10/2014   HGB 10.5* 12/10/2014   HCT 32.2* 12/10/2014   MCV 89.7 12/10/2014   PLT 269 12/10/2014   NEUTROABS 4.8 12/10/2014    ASSESSMENT & PLAN:  Anemia of chronic disease 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 ( anemia of chronic inflammation), patient has chronic polymyalgia rheumatica I reviewed her blood counts with her. There is no indication for treatment because she is asymptomatic with a hemoglobin greater than 10 g.  If her hemoglobin drops below 10 g, I would like to see her back sooner and she might require a bone marrow biopsy. Return to clinic in 1 year for follow-up    Orders Placed This Encounter  Procedures  . CBC with Differential    Standing Status: Future     Number of Occurrences:      Standing Expiration Date: 12/10/2015  . Iron and TIBC [facility]    Standing Status: Future     Number of Occurrences:      Standing Expiration Date: 12/10/2015  . Ferritin    Standing Status: Future     Number of Occurrences:      Standing Expiration  Date: 12/10/2015   The patient has a good understanding of the overall plan. she agrees with it. She will call with any problems that may develop before her next visit here.   Rulon Eisenmenger, MD

## 2014-12-10 NOTE — Telephone Encounter (Signed)
per pof to sch pt appt-gave pt copy of sch °

## 2014-12-14 ENCOUNTER — Ambulatory Visit: Payer: Medicare Other | Admitting: Family Medicine

## 2014-12-22 ENCOUNTER — Other Ambulatory Visit: Payer: Self-pay | Admitting: Family Medicine

## 2015-03-06 ENCOUNTER — Telehealth: Payer: Self-pay

## 2015-03-06 MED ORDER — PREDNISONE 20 MG PO TABS: 20.0000 mg | ORAL_TABLET | Freq: Every day | ORAL | Status: DC

## 2015-03-06 NOTE — Telephone Encounter (Signed)
Pt is hurting all over; pt said Dr Glori Bickers understands her situation and that pt stays very busy all the time and does not want to schedule appt; pt just wants prednisone refilled to [facility]. Pt last seen 07/30/14, no future appt scheduled. Pt did say if she has to make appt she will but she wants request sent to Dr Glori Bickers first.Please advise.

## 2015-03-06 NOTE — Telephone Encounter (Signed)
She is having an exacerbation of PMR  I will [facility]  Stay on that dose and f/u with me in 1 mo

## 2015-03-07 NOTE — Telephone Encounter (Signed)
Pt notified Rx sent to pharmacy and advise of Dr. Marliss Coots comments. F/u appt scheduled in one month

## 2015-03-30 ENCOUNTER — Other Ambulatory Visit: Payer: Self-pay | Admitting: Family Medicine

## 2015-04-09 ENCOUNTER — Encounter: Payer: Self-pay | Admitting: Family Medicine

## 2015-04-09 ENCOUNTER — Ambulatory Visit (INDEPENDENT_AMBULATORY_CARE_PROVIDER_SITE_OTHER): Payer: Medicare Other | Admitting: Family Medicine

## 2015-04-09 VITALS — BP 112/70 | HR 87 | Temp 98.3°F | Ht 64.0 in | Wt 195.0 lb

## 2015-04-09 DIAGNOSIS — M353 Polymyalgia rheumatica: Secondary | ICD-10-CM | POA: Diagnosis not present

## 2015-04-09 DIAGNOSIS — M859 Disorder of bone density and structure, unspecified: Secondary | ICD-10-CM | POA: Diagnosis not present

## 2015-04-09 DIAGNOSIS — T380X5A Adverse effect of glucocorticoids and synthetic analogues, initial encounter: Secondary | ICD-10-CM

## 2015-04-09 DIAGNOSIS — M8580 Other specified disorders of bone density and structure, unspecified site: Secondary | ICD-10-CM

## 2015-04-09 MED ORDER — RALOXIFENE HCL 60 MG PO TABS: 60.0000 mg | ORAL_TABLET | Freq: Every day | ORAL | Status: DC

## 2015-04-09 MED ORDER — PREDNISONE 10 MG PO TABS: 10.0000 mg | ORAL_TABLET | Freq: Every day | ORAL | Status: DC

## 2015-04-09 NOTE — Assessment & Plan Note (Signed)
Better on 20 mg for the past mo  Cut to 10 for 1 mo and then 5 mg for 1 mo (update if worse pain) Then will update -if doing will will wean gradually from 5 to 0   Disc side eff of prednisone   In addn to PMR- has OA and also wonder if some fibromyalgia-unsure  Enc to stay active

## 2015-04-09 NOTE — Progress Notes (Signed)
Subjective:    Patient ID: [name] [name], female    DOB: [birth year], 74 y.o.   MRN: [id number]  HPI Here for PMR f/u   Was doing well until mid July - called with c/o of pain all over again      (it had been "a right good while" since she needed it)  Not quite a year  Started on prednisone 20 mg daily  She is quite a bit better "can move relatively easy"- getting up and down is a whole lot easier  Still a little discomfort in neck - shoulders - nothing like it was   She tends to be tender in legs as well - to the touch-improved   Aware she has OA Wonder if poss fibromyalgia - she has taken lyrica from ortho in the past   Doing ok with prednisone - not a good sleeper anyway/has not gained any weight    Of note - dexa 1/15 osteopenia Disc evista -never did start it  Was afraid of GI side eff with bisphosphenates   Patient Active Problem List   Diagnosis Date Noted  . Deficiency anemia 11/17/2013  . Viral upper respiratory illness 08/14/2013  . Encounter for Medicare annual wellness exam 07/17/2013  . Muscle pain 07/17/2013  . PMR (polymyalgia rheumatica) 07/17/2013  . Intertrigo 04/19/2013  . Rectal bleeding 04/19/2013  . Expected blood loss anemia 04/05/2013  . Obese 04/05/2013  . 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 Conjunctivitis 03/06/2013  . IBS (irritable bowel syndrome) 05/30/2012  . Anemia of chronic disease 04/16/2010  . ANXIETY 04/16/2010  . DYSPHAGIA 03/07/2009  . ESOPHAGEAL STRICTURE 03/04/2009  . GERD 03/04/2009  . UNSPECIFIED VITAMIN D DEFICIENCY 03/12/2008  . URINARY TRACT INFECTION, RECURRENT 08/07/2007  . OSTEOPENIA 02/21/2007  . FIBROCYSTIC BREAST DISEASE, HX OF 02/11/2007  . HYPERCHOLESTEROLEMIA WITH HIGH HDL 01/10/2007  . DEPRESSION 01/10/2007  . RETINAL VEIN OCCLUSION 01/10/2007  . HYPERTENSION 01/10/2007  . CAD 01/10/2007  . PEPTIC ULCER DISEASE 01/10/2007  . GALLSTONE PANCREATITIS 01/10/2007  . OSTEOARTHRITIS 01/10/2007  . SKIN CANCER, HX OF  01/10/2007   Past Medical History  Diagnosis Date  . Hx of skin cancer, basal cell 2013 and 2012  . Psoriasis   . Depression   . Anxiety   . Hypertension   . Arthritis     osteoarthritis  . Peptic ulcer disease     H pylori  . Fibrocystic breast   . Osteopenia   . Anemia     of chronic disease  . Complication of anesthesia 1980 and 1982    trouble waking up after breast biopsy   Past Surgical History  Procedure Laterality Date  . Knee surgery      x3  . Nasal sinus surgery    . Esophagogastroduodenoscopy  03/2009    ulcer,HP, GERD stricture  . Spine surgery      back surgery x 3  . Spine surgery  2005    deg. disk lumber spine   . Breast surgery    . Abdominal hysterectomy      partial in 1970s  . Cholecystectomy  2005  . Hip arthroplasty Left 2010  . Joint replacement Right 2009    knee  . Breast biopsy Left 1980 and 1982  . Total hip arthroplasty Right 04/04/2013    Procedure: RIGHT TOTAL HIP ARTHROPLASTY ANTERIOR APPROACH;  Surgeon: Mauri Pole, MD;  Location: [facility];  Service: Orthopedics;  Laterality: Right;   Social History  Substance Use Topics  . Smoking status: Never Smoker   . Smokeless tobacco: Never Used  . Alcohol Use: 0.0 oz/week    0 Standard drinks or equivalent per week     Comment: occasional   Family History  Problem Relation Age of Onset  . Arthritis Mother   . Emphysema Mother   . Hyperlipidemia Mother   . Hypertension Mother   . Heart disease Brother     CAD   Allergies  Allergen Reactions  . Ace Inhibitors     REACTION: cough  . Amoxicillin-Pot Clavulanate     REACTION: gi upset  . Oxaprozin     REACTION: unknown  . Pregabalin     REACTION: swelling  . Sulfonamide Derivatives     REACTION: UNKNOWN   Current Outpatient Prescriptions on File Prior to Visit  Medication Sig Dispense Refill  . atorvastatin (LIPITOR) 10 MG tablet Take 1 tablet (10 mg total) by mouth daily. *Please schedule a follow up appointment with Dr  Glori Bickers for refills. 30 tablet 1  . busPIRone (BUSPAR) 15 MG tablet TAKE 1/2 TO 1 TABLET BY MOUTH TWICE A DAY 180 tablet 3  . Calcium-Vitamin D (CALTRATE 600 PLUS-VIT D PO) Take 1 tablet by mouth 2 (two) times daily.    Marland Kitchen esomeprazole (NEXIUM) 40 MG capsule TAKE 1 CAPSULE (40 MG TOTAL) BY MOUTH DAILY BEFORE BREAKFAST. 90 capsule 1  . hyoscyamine (LEVSIN, ANASPAZ) 0.125 MG tablet TAKE 1 TABLET BY MOUTH AS NEEDED. 30 tablet 4  . losartan-hydrochlorothiazide (HYZAAR) 100-25 MG per tablet TAKE 1 TABLET BY MOUTH DAILY. 90 tablet 3  . Melatonin 3 MG CAPS Take 6 mg by mouth at bedtime.    Marland Kitchen oxymetazoline (AFRIN) 0.05 % nasal spray Place 2 sprays into the nose 2 (two) times daily as needed for congestion.    . predniSONE (DELTASONE) 20 MG tablet Take 1 tablet (20 mg total) by mouth daily. 30 tablet 1  . traMADol (ULTRAM) 50 MG tablet      No current facility-administered medications on file prior to visit.     Review of Systems    Review of Systems  Constitutional: Negative for fever, appetite change, fatigue and unexpected weight change.  Eyes: Negative for pain and visual disturbance.  Respiratory: Negative for cough and shortness of breath.   Cardiovascular: Negative for cp or palpitations    Gastrointestinal: Negative for nausea, diarrhea and constipation.  Genitourinary: Negative for urgency and frequency.  Skin: Negative for pallor or rash   Neurological: Negative for weakness, light-headedness, numbness and headaches.  Hematological: Negative for adenopathy. Does not bruise/bleed easily.  Psychiatric/Behavioral: Negative for dysphoric mood. The patient is not nervous/anxious.      Objective:   Physical Exam  Constitutional: She appears well-developed and well-nourished. No distress.  obese and well appearing   HENT:  Head: Normocephalic and atraumatic.  Mouth/Throat: Oropharynx is clear and moist.  Eyes: Conjunctivae and EOM are normal. Pupils are equal, round, and reactive to light.  No scleral icterus.  Neck: Normal range of motion. Neck supple. No JVD present. No thyromegaly present.  Cardiovascular: Normal rate and regular rhythm.   Pulmonary/Chest: Effort normal and breath sounds normal.  Musculoskeletal: She exhibits tenderness. She exhibits no edema.  Diffuse tenderness over neck/ trapezius/shoulders and upper arms  Limited rom knees and hips   No swollen/warm or erythematous joints   Lymphadenopathy:    She has no cervical adenopathy.  Neurological: She is alert. She has normal reflexes. No cranial nerve  deficit. She exhibits normal muscle tone. Coordination normal.  Skin: Skin is warm and dry. No rash noted. No erythema. No pallor.  Psychiatric: She has a normal mood and affect.          Assessment & Plan:   Problem List Items Addressed This Visit    PMR (polymyalgia rheumatica)    Better on 20 mg for the past mo  Cut to 10 for 1 mo and then 5 mg for 1 mo (update if worse pain) Then will update -if doing will will wean gradually from 5 to 0   Disc side eff of prednisone   In addn to PMR- has OA and also wonder if some fibromyalgia-unsure  Enc to stay active       Steroid-induced osteopenia    In pt who is post menop On prednisone for PMR  Again disc tx- will try evista - given info and px  Update if side eff  Disc need for calcium/ vitamin D/ wt bearing exercise and bone density test every 2 y to monitor Disc safety/ fracture risk in detail

## 2015-04-09 NOTE — Patient Instructions (Signed)
Cut the prednisone to 10 mg daily for 1 month  Then 5 mg daily for 1 month (if pain suddenly increases let me know)  Then let me know how you are doing and we will slowly try to get you off of it gradually from 5 mg to 0  In the meantime please try the evista (update if problems or side effects) to prevent fractures from low bone mass (since prednisone makes that worse)   Take care of yourself

## 2015-04-09 NOTE — Progress Notes (Signed)
Pre visit review using our clinic review tool, if applicable. No additional management support is needed unless otherwise documented below in the visit note. 

## 2015-04-09 NOTE — Assessment & Plan Note (Signed)
In pt who is post menop On prednisone for PMR  Again disc tx- will try evista - given info and px  Update if side eff  Disc need for calcium/ vitamin D/ wt bearing exercise and bone density test every 2 y to monitor Disc safety/ fracture risk in detail

## 2015-08-20 ENCOUNTER — Ambulatory Visit (INDEPENDENT_AMBULATORY_CARE_PROVIDER_SITE_OTHER): Payer: Medicare Other | Admitting: Family Medicine

## 2015-08-20 ENCOUNTER — Encounter (INDEPENDENT_AMBULATORY_CARE_PROVIDER_SITE_OTHER): Payer: Self-pay

## 2015-08-20 ENCOUNTER — Encounter: Payer: Self-pay | Admitting: Family Medicine

## 2015-08-20 VITALS — BP 108/64 | HR 95 | Temp 98.3°F | Ht 64.0 in | Wt 191.0 lb

## 2015-08-20 DIAGNOSIS — R5382 Chronic fatigue, unspecified: Secondary | ICD-10-CM | POA: Diagnosis not present

## 2015-08-20 DIAGNOSIS — E559 Vitamin D deficiency, unspecified: Secondary | ICD-10-CM

## 2015-08-20 NOTE — Progress Notes (Signed)
Pre visit review using our clinic review tool, if applicable. No additional management support is needed unless otherwise documented below in the visit note. 

## 2015-08-20 NOTE — Progress Notes (Signed)
Subjective:    Patient ID: [name] [name], female    DOB: 19-[name]-1941, 74 y.o.   MRN: [id number]  HPI Here for fatigue   Is exhausted/tired/fatigued  Worse since 12/17 -not able to do much   Did loose someone her family was close to - went to a funeral and a church meeting  Did not have to do a lot for xmas  Has plenty of work to do - not a lot of motivation   No new stressors other than that   Does have a 75 yo at home/ also has to do a lot for her husband   Sleeps poorly - (chronic)-no change in that - sleeps 2-3 hours at a time  Does not snore a lot that she knows  Never feels refreshed because she has never slept well   Hx of anemia - sees Dr Jeanine Luz (anemia of chronic dz)  Last Hb 10.5   Lab Results  Component Value Date   TSH 1.10 06/26/2013      Chemistry      Component Value Date/Time   NA 141 12/10/2014 1012   NA 132* 04/07/2013 0820   NA 141 02/28/2009 1250   K 3.7 12/10/2014 1012   K 3.9 04/07/2013 0820   K 4.0 02/28/2009 1250   CL 95* 04/07/2013 0820   CL 98 02/28/2009 1250   CO2 25 12/10/2014 1012   CO2 31 04/07/2013 0820   CO2 26 02/28/2009 1250   BUN 15.7 12/10/2014 1012   BUN 7 04/07/2013 0820   BUN 12 02/28/2009 1250   CREATININE 0.9 12/10/2014 1012   CREATININE 0.79 04/07/2013 0820   CREATININE 0.7 02/28/2009 1250      Component Value Date/Time   CALCIUM 9.2 12/10/2014 1012   CALCIUM 9.2 04/07/2013 0820   CALCIUM 9.1 02/28/2009 1250   ALKPHOS 61 12/10/2014 1012   ALKPHOS 63 05/25/2012 0941   ALKPHOS 66 02/28/2009 1250   AST 18 12/10/2014 1012   AST 22 05/25/2012 0941   AST 21 02/28/2009 1250   ALT 13 12/10/2014 1012   ALT 15 05/25/2012 0941   ALT 19 02/28/2009 1250   BILITOT 0.47 12/10/2014 1012   BILITOT 0.5 05/25/2012 0941   BILITOT 0.50 02/28/2009 1250      Hx of D def as well- level in 26s   Patient Active Problem List   Diagnosis Date Noted  . Chronic fatigue 08/20/2015  . Deficiency anemia 11/17/2013  . Viral upper  respiratory illness 08/14/2013  . Encounter for Medicare annual wellness exam 07/17/2013  . Muscle pain 07/17/2013  . PMR (polymyalgia rheumatica) ([facility]) 07/17/2013  . Intertrigo 04/19/2013  . Rectal bleeding 04/19/2013  . Expected blood loss anemia 04/05/2013  . Obese 04/05/2013  . 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 Conjunctivitis 03/06/2013  . IBS (irritable bowel syndrome) 05/30/2012  . Anemia of chronic disease 04/16/2010  . ANXIETY 04/16/2010  . DYSPHAGIA 03/07/2009  . ESOPHAGEAL STRICTURE 03/04/2009  . GERD 03/04/2009  . UNSPECIFIED VITAMIN D DEFICIENCY 03/12/2008  . URINARY TRACT INFECTION, RECURRENT 08/07/2007  . 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 FIBROCYSTIC BREAST DISEASE, HX OF 02/11/2007  . HYPERCHOLESTEROLEMIA WITH HIGH HDL 01/10/2007  . DEPRESSION 01/10/2007  . RETINAL VEIN OCCLUSION 01/10/2007  . HYPERTENSION 01/10/2007  . CAD 01/10/2007  . PEPTIC ULCER DISEASE 01/10/2007  . GALLSTONE PANCREATITIS 01/10/2007  . OSTEOARTHRITIS 01/10/2007  . SKIN CANCER, HX OF 01/10/2007   Past Medical History  Diagnosis Date  . Hx of skin  cancer, basal cell 2013 and 2012  . Psoriasis   . Depression   . Anxiety   . Hypertension   . Arthritis     osteoarthritis  . Peptic ulcer disease     H pylori  . Fibrocystic breast   . Osteopenia   . Anemia     of chronic disease  . Complication of anesthesia 1980 and 1982    trouble waking up after breast biopsy   Past Surgical History  Procedure Laterality Date  . Knee surgery      x3  . Nasal sinus surgery    . Esophagogastroduodenoscopy  03/2009    ulcer,HP, GERD stricture  . Spine surgery      back surgery x 3  . Spine surgery  2005    deg. disk lumber spine   . Breast surgery    . Abdominal hysterectomy      partial in 1970s  . Cholecystectomy  2005  . Hip arthroplasty Left 2010  . Joint replacement Right 2009    knee  . Breast biopsy Left 1980 and 1982  . Total hip arthroplasty Right 04/04/2013    Procedure:  RIGHT TOTAL HIP ARTHROPLASTY ANTERIOR APPROACH;  Surgeon: Mauri Pole, MD;  Location: [facility];  Service: Orthopedics;  Laterality: Right;   Social History  Substance Use Topics  . Smoking status: Never Smoker   . Smokeless tobacco: Never Used  . Alcohol Use: 0.0 oz/week    0 Standard drinks or equivalent per week     Comment: occasional   Family History  Problem Relation Age of Onset  . Arthritis Mother   . Emphysema Mother   . Hyperlipidemia Mother   . Hypertension Mother   . Heart disease Brother     CAD   Allergies  Allergen Reactions  . Ace Inhibitors     REACTION: cough  . Amoxicillin-Pot Clavulanate     REACTION: gi upset  . Oxaprozin     REACTION: unknown  . Pregabalin     REACTION: swelling  . Sulfonamide Derivatives     REACTION: UNKNOWN   Current Outpatient Prescriptions on File Prior to Visit  Medication Sig Dispense Refill  . busPIRone (BUSPAR) 15 MG tablet TAKE 1/2 TO 1 TABLET BY MOUTH TWICE A DAY 180 tablet 3  . Calcium-Vitamin D (CALTRATE 600 PLUS-VIT D PO) Take 1 tablet by mouth 2 (two) times daily.    Marland Kitchen esomeprazole (NEXIUM) 40 MG capsule TAKE 1 CAPSULE (40 MG TOTAL) BY MOUTH DAILY BEFORE BREAKFAST. 90 capsule 1  . hyoscyamine (LEVSIN, ANASPAZ) 0.125 MG tablet TAKE 1 TABLET BY MOUTH AS NEEDED. 30 tablet 4  . losartan-hydrochlorothiazide (HYZAAR) 100-25 MG per tablet TAKE 1 TABLET BY MOUTH DAILY. 90 tablet 3  . Melatonin 3 MG CAPS Take 6 mg by mouth at bedtime.    Marland Kitchen oxymetazoline (AFRIN) 0.05 % nasal spray Place 2 sprays into the nose 2 (two) times daily as needed for congestion.    . raloxifene (EVISTA) 60 MG tablet Take 1 tablet (60 mg total) by mouth daily. 30 tablet 11  . traMADol (ULTRAM) 50 MG tablet     . atorvastatin (LIPITOR) 10 MG tablet Take 1 tablet (10 mg total) by mouth daily. *Please schedule a follow up appointment with Dr Glori Bickers for refills. (Patient not taking: Reported on 08/20/2015) 30 tablet 1   No current facility-administered  medications on file prior to visit.     Review of Systems Review of Systems  Constitutional: Negative  for fever, appetite change,  and unexpected weight change. pos for fatigue without malaise , pos for chronic low appetite  Eyes: Negative for pain and visual disturbance.  Respiratory: Negative for cough and shortness of breath.   Cardiovascular: Negative for cp or palpitations    Gastrointestinal: Negative for nausea, diarrhea and constipation. neg for blood in stool or dark stool or abdominal pain  Genitourinary: Negative for urgency and frequency. neg for dysuria  Skin: Negative for pallor or rash   Neurological: Negative for weakness, light-headedness, numbness and headaches.  Hematological: Negative for adenopathy. Does not bruise/bleed easily.  Psychiatric/Behavioral: Negative for dysphoric mood. The patient is not nervous/anxious.  pos for stressors         Objective:   Physical Exam  Constitutional: She appears well-developed and well-nourished. No distress.  obese and well appearing   HENT:  Head: Normocephalic and atraumatic.  Mouth/Throat: Oropharynx is clear and moist.  Eyes: EOM are normal. Pupils are equal, round, and reactive to light.  Mild conj pallor No icterus  Neck: Normal range of motion. Neck supple. No JVD present. Carotid bruit is not present. No thyromegaly present.  Cardiovascular: Normal rate, regular rhythm, normal heart sounds and intact distal pulses.  Exam reveals no gallop.   Pulmonary/Chest: Effort normal and breath sounds normal. No respiratory distress. She has no wheezes. She has no rales.  No crackles  Abdominal: Soft. Bowel sounds are normal. She exhibits no distension, no abdominal bruit and no mass. There is no tenderness.  Musculoskeletal: She exhibits no edema.  Lymphadenopathy:    She has no cervical adenopathy.  Neurological: She is alert. She has normal reflexes.  Skin: Skin is warm and dry. No rash noted. No erythema.  Fair  complexion-no pallor  Psychiatric: Her speech is normal and behavior is normal. Her affect is blunt. Thought content is not paranoid. Cognition and memory are normal. She expresses no homicidal and no suicidal ideation.          Assessment & Plan:   Problem List Items Addressed This Visit      Other   Chronic fatigue - Primary    Suspect multifactorial - with chronic health problems and stressors (caring for husband and 31 year old) ? If depression could play a role    Reviewed stressors/ coping techniques/symptoms/ support sources/ tx options and side effects in detail today Lab today- hx of moderate anemia - sees hematology yearly for anemia of chronic disease  Also tsh/B12/chem panel Update       Relevant Orders   CBC with Differential/Platelet   Comprehensive metabolic panel   TSH   Vitamin B12   VITAMIN D 25 Hydroxy (Vit-D Deficiency, Fractures)

## 2015-08-20 NOTE — Assessment & Plan Note (Signed)
Suspect multifactorial - with chronic health problems and stressors (caring for husband and 75 year old) ? If depression could play a role    Reviewed stressors/ coping techniques/symptoms/ support sources/ tx options and side effects in detail today Lab today- hx of moderate anemia - sees hematology yearly for anemia of chronic disease  Also tsh/B12/chem panel Update

## 2015-08-20 NOTE — Patient Instructions (Signed)
Your fatigue may be multifactorial  Try to eat regular meals and drink enough fluids Lab today-checking for anemia/blood sugar/thyroid/vit D/ vit B12- let's see how things look Try to take care of yourself

## 2015-08-21 LAB — COMPREHENSIVE METABOLIC PANEL
ALBUMIN: 3.5 g/dL (ref 3.5–5.2)
ALT: 7 U/L (ref 0–35)
AST: 12 U/L (ref 0–37)
Alkaline Phosphatase: 54 U/L (ref 39–117)
BILIRUBIN TOTAL: 0.2 mg/dL (ref 0.2–1.2)
BUN: 24 mg/dL — ABNORMAL HIGH (ref 6–23)
CALCIUM: 9.2 mg/dL (ref 8.4–10.5)
CO2: 25 meq/L (ref 19–32)
Chloride: 104 mEq/L (ref 96–112)
Creatinine, Ser: 1.21 mg/dL — ABNORMAL HIGH (ref 0.40–1.20)
GFR: 45.98 mL/min — AB (ref >60.00)
Glucose, Bld: 166 mg/dL — ABNORMAL HIGH (ref 70–99)
Potassium: 4.1 mEq/L (ref 3.5–5.1)
Sodium: 140 mEq/L (ref 135–145)
Total Protein: 6.8 g/dL (ref 6.0–8.3)

## 2015-08-21 LAB — CBC WITH DIFFERENTIAL/PLATELET
BASOS ABS: 0 10*3/uL (ref 0.0–0.1)
BASOS PCT: 0.2 % (ref 0.0–3.0)
Eosinophils Absolute: 0 10*3/uL (ref 0.0–0.7)
Eosinophils Relative: 0 % (ref 0.0–5.0)
HEMATOCRIT: 30.2 % — AB (ref 36.0–46.0)
Hemoglobin: 9.9 g/dL — ABNORMAL LOW (ref 12.0–15.0)
LYMPHS PCT: 8.8 % — AB (ref 12.0–46.0)
Lymphs Abs: 0.6 10*3/uL — ABNORMAL LOW (ref 0.7–4.0)
MCHC: 32.7 g/dL (ref 30.0–36.0)
MCV: 88.3 fl (ref 78.0–100.0)
MONOS PCT: 1.4 % — AB (ref 3.0–12.0)
Monocytes Absolute: 0.1 10*3/uL (ref 0.1–1.0)
Neutro Abs: 6.6 10*3/uL (ref 1.4–7.7)
Neutrophils Relative %: 89.6 % — ABNORMAL HIGH (ref 43.0–77.0)
Platelets: 417 10*3/uL — ABNORMAL HIGH (ref 150.0–400.0)
RBC: 3.42 Mil/uL — ABNORMAL LOW (ref 3.87–5.11)
RDW: 13.1 % (ref 11.5–15.5)
WBC: 7.4 10*3/uL (ref 4.0–10.5)

## 2015-08-21 LAB — VITAMIN D 25 HYDROXY (VIT D DEFICIENCY, FRACTURES): VITD: 11.58 ng/mL — AB (ref 30.00–100.00)

## 2015-08-21 LAB — VITAMIN B12: Vitamin B-12: 306 pg/mL (ref 211–911)

## 2015-08-21 LAB — TSH: TSH: 0.51 u[IU]/mL (ref 0.35–4.50)

## 2015-08-23 ENCOUNTER — Telehealth: Payer: Self-pay | Admitting: *Deleted

## 2015-08-23 DIAGNOSIS — D638 Anemia in other chronic diseases classified elsewhere: Secondary | ICD-10-CM

## 2015-08-23 MED ORDER — VITAMIN D (ERGOCALCIFEROL) 1.25 MG (50000 UNIT) PO CAPS: 50000.0000 [IU] | ORAL_CAPSULE | ORAL | Status: DC

## 2015-08-23 NOTE — Telephone Encounter (Signed)
-----   Message from Abner Greenspan, MD sent at 08/22/2015  1:25 PM EST ----- Cr (kidney number) is up slightly - please try to drink more water  Thyroid is ok  D level is low - please call in ergocalciferol 50,000 iu 1 po Q week for 12 weeks #12 no refills - re check in 12 weeks please  B12 level is low normal- can try a B complex vit daily otc if she does not already take one  Anemia is mildly worse with Hb of 9.9- I think Dr Jeanine Luz wanted to see her back if it went under 10 - do you want me to do a referral?

## 2015-08-23 NOTE — Telephone Encounter (Signed)
Placed on [place] wq. Sent Rhys Martini a message that patient needed to be seen before April when her followup is.

## 2015-08-23 NOTE — Telephone Encounter (Signed)
Ref to hematology for f/u

## 2015-08-23 NOTE — Telephone Encounter (Signed)
Pt notified of lab results and Dr. Marliss Coots comments. Rx sent to pharmacy, pt declined to scheduled lab appt for now due to other issues, she will call back and schedule it later  Pt does agree with referral back to Dr. Jinny Blossom office, please put referral in  Pt request we call her on cell about referral (cell # [phone])

## 2015-08-27 ENCOUNTER — Other Ambulatory Visit: Payer: Self-pay

## 2015-08-27 DIAGNOSIS — D638 Anemia in other chronic diseases classified elsewhere: Secondary | ICD-10-CM

## 2015-08-28 ENCOUNTER — Other Ambulatory Visit (HOSPITAL_BASED_OUTPATIENT_CLINIC_OR_DEPARTMENT_OTHER): Payer: Medicare Other

## 2015-08-28 DIAGNOSIS — D638 Anemia in other chronic diseases classified elsewhere: Secondary | ICD-10-CM | POA: Diagnosis not present

## 2015-08-28 LAB — CBC WITH DIFFERENTIAL/PLATELET
BASO%: 0.3 % (ref 0.0–2.0)
Basophils Absolute: 0 10*3/uL (ref 0.0–0.1)
EOS ABS: 0 10*3/uL (ref 0.0–0.5)
EOS%: 0.4 % (ref 0.0–7.0)
HCT: 32.7 % — ABNORMAL LOW (ref 34.8–46.6)
HGB: 10.5 g/dL — ABNORMAL LOW (ref 11.6–15.9)
LYMPH%: 18.5 % (ref 14.0–49.7)
MCH: 29.1 pg (ref 25.1–34.0)
MCHC: 32.1 g/dL (ref 31.5–36.0)
MCV: 90.6 fL (ref 79.5–101.0)
MONO#: 0.6 10*3/uL (ref 0.1–0.9)
MONO%: 9 % (ref 0.0–14.0)
NEUT%: 71.8 % (ref 38.4–76.8)
NEUTROS ABS: 4.9 10*3/uL (ref 1.5–6.5)
NRBC: 0 % (ref 0–0)
PLATELETS: 396 10*3/uL (ref 145–400)
RBC: 3.61 10*6/uL — AB (ref 3.70–5.45)
RDW: 12.9 % (ref 11.2–14.5)
WBC: 6.8 10*3/uL (ref 3.9–10.3)
lymph#: 1.3 10*3/uL (ref 0.9–3.3)

## 2015-08-28 LAB — IRON AND TIBC
%SAT: 13 % — ABNORMAL LOW (ref 21–57)
Iron: 35 ug/dL — ABNORMAL LOW (ref 41–142)
TIBC: 276 ug/dL (ref 236–444)
UIBC: 241 ug/dL (ref 120–384)

## 2015-08-28 LAB — FERRITIN: FERRITIN: 232 ng/mL (ref 9–269)

## 2015-08-28 LAB — C-REACTIVE PROTEIN: CRP: 1.5 mg/dL — AB (ref <0.60)

## 2015-08-30 ENCOUNTER — Telehealth: Payer: Self-pay | Admitting: Hematology and Oncology

## 2015-08-30 ENCOUNTER — Encounter: Payer: Self-pay | Admitting: Hematology and Oncology

## 2015-08-30 ENCOUNTER — Ambulatory Visit (HOSPITAL_BASED_OUTPATIENT_CLINIC_OR_DEPARTMENT_OTHER): Payer: Medicare Other | Admitting: Hematology and Oncology

## 2015-08-30 VITALS — BP 146/76 | HR 102 | Temp 97.9°F | Resp 18 | Ht 64.0 in | Wt 188.1 lb

## 2015-08-30 DIAGNOSIS — D638 Anemia in other chronic diseases classified elsewhere: Secondary | ICD-10-CM | POA: Diagnosis not present

## 2015-08-30 DIAGNOSIS — N289 Disorder of kidney and ureter, unspecified: Secondary | ICD-10-CM | POA: Diagnosis not present

## 2015-08-30 DIAGNOSIS — M353 Polymyalgia rheumatica: Secondary | ICD-10-CM

## 2015-08-30 NOTE — Telephone Encounter (Signed)
Appointments made and avs printed for patient °

## 2015-08-30 NOTE — Addendum Note (Signed)
Addended by: Prentiss Bells on: 08/30/2015 02:06 PM   Modules accepted: Medications

## 2015-08-30 NOTE — Assessment & Plan Note (Signed)
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 ( anemia of chronic inflammation), patient has chronic polymyalgia rheumatica I reviewed her blood counts with her. There is no indication for treatment because she is asymptomatic with a hemoglobin greater than 10 g.  If her hemoglobin drops below 10 g, I would like to see her back sooner and she might require a bone marrow biopsy. Return to clinic in 1 year for follow-up

## 2015-08-30 NOTE — Progress Notes (Signed)
Patient Care Team: Abner Greenspan, MD as PCP - General  DIAGNOSIS: normocytic anemia  CHIEF COMPLIANT: follow-up on anemia  INTERVAL HISTORY: [name] [name] is a 75 yr old with above-mentioned history of chronic anemia related to inflammation who is here because of recent blood work revealed her hemoglobin to be 9.9. Because of this drop in hemoglobin to below 10, she was referred to see [name] back sooner. Workup in December also revealed that she had mild renal disease with a creatinine of 1.2.  REVIEW OF SYSTEMS:   Constitutional: Denies fevers, chills or abnormal weight loss Eyes: Denies blurriness of vision Ears, nose, mouth, throat, and face: Denies mucositis or sore throat Respiratory: Denies cough, dyspnea or wheezes Cardiovascular: Denies palpitation, chest discomfort Gastrointestinal:  Denies nausea, heartburn or change in bowel habits Skin: Denies abnormal skin rashes Lymphatics: Denies new lymphadenopathy or easy bruising Neurological:Denies numbness, tingling or new weaknesses Behavioral/Psych: Mood is stable, no new changes  Extremities: No lower extremity edema All other systems were reviewed with the patient and are negative.  I have reviewed the past medical history, past surgical history, social history and family history with the patient and they are unchanged from previous note.  ALLERGIES:  is allergic to ace inhibitors; amoxicillin-pot clavulanate; oxaprozin; pregabalin; and sulfonamide derivatives.  MEDICATIONS:  Current Outpatient Prescriptions  Medication Sig Dispense Refill  . atorvastatin (LIPITOR) 10 MG tablet Take 1 tablet (10 mg total) by mouth daily. *Please schedule a follow up appointment with Dr Glori Bickers for refills. (Patient not taking: Reported on 08/20/2015) 30 tablet 1  . busPIRone (BUSPAR) 15 MG tablet TAKE 1/2 TO 1 TABLET BY MOUTH TWICE A DAY 180 tablet 3  . Calcium-Vitamin D (CALTRATE 600 PLUS-VIT D PO) Take 1 tablet by mouth 2 (two) times daily.     Marland Kitchen esomeprazole (NEXIUM) 40 MG capsule TAKE 1 CAPSULE (40 MG TOTAL) BY MOUTH DAILY BEFORE BREAKFAST. 90 capsule 1  . hyoscyamine (LEVSIN, ANASPAZ) 0.125 MG tablet TAKE 1 TABLET BY MOUTH AS NEEDED. 30 tablet 4  . losartan-hydrochlorothiazide (HYZAAR) 100-25 MG per tablet TAKE 1 TABLET BY MOUTH DAILY. 90 tablet 3  . Melatonin 3 MG CAPS Take 6 mg by mouth at bedtime.    Marland Kitchen oxymetazoline (AFRIN) 0.05 % nasal spray Place 2 sprays into the nose 2 (two) times daily as needed for congestion.    . raloxifene (EVISTA) 60 MG tablet Take 1 tablet (60 mg total) by mouth daily. 30 tablet 11  . traMADol (ULTRAM) 50 MG tablet     . Vitamin D, Ergocalciferol, (DRISDOL) 50000 units CAPS capsule Take 1 capsule (50,000 Units total) by mouth every 7 (seven) days. 4 capsule 2   No current facility-administered medications for this visit.    PHYSICAL EXAMINATION: ECOG PERFORMANCE STATUS: 1 - Symptomatic but completely ambulatory  Filed Vitals:   08/30/15 1024  BP: 146/76  Pulse: 102  Temp: 97.9 F (36.6 C)  Resp: 18   Filed Weights   08/30/15 1024  Weight: 188 lb 1.6 oz (85.322 kg)    GENERAL:alert, no distress and comfortable SKIN: skin color, texture, turgor are normal, no rashes or significant lesions EYES: normal, Conjunctiva are pink and non-injected, sclera clear OROPHARYNX:no exudate, no erythema and lips, buccal mucosa, and tongue normal  NECK: supple, thyroid normal size, non-tender, without nodularity LYMPH:  no palpable lymphadenopathy in the cervical, axillary or inguinal LUNGS: clear to auscultation and percussion with normal breathing effort HEART: regular rate & rhythm and no murmurs and  no lower extremity edema ABDOMEN:abdomen soft, non-tender and normal bowel sounds MUSCULOSKELETAL:no cyanosis of digits and no clubbing  NEURO: alert & oriented x 3 with fluent speech, no focal motor/sensory deficits EXTREMITIES: No lower extremity edema  LABORATORY DATA:  I have reviewed the data  as listed   Chemistry      Component Value Date/Time   NA 140 08/20/2015 1556   NA 141 12/10/2014 1012   NA 141 02/28/2009 1250   K 4.1 08/20/2015 1556   K 3.7 12/10/2014 1012   K 4.0 02/28/2009 1250   CL 104 08/20/2015 1556   CL 98 02/28/2009 1250   CO2 25 08/20/2015 1556   CO2 25 12/10/2014 1012   CO2 26 02/28/2009 1250   BUN 24* 08/20/2015 1556   BUN 15.7 12/10/2014 1012   BUN 12 02/28/2009 1250   CREATININE 1.21* 08/20/2015 1556   CREATININE 0.9 12/10/2014 1012   CREATININE 0.7 02/28/2009 1250      Component Value Date/Time   CALCIUM 9.2 08/20/2015 1556   CALCIUM 9.2 12/10/2014 1012   CALCIUM 9.1 02/28/2009 1250   ALKPHOS 54 08/20/2015 1556   ALKPHOS 61 12/10/2014 1012   ALKPHOS 66 02/28/2009 1250   AST 12 08/20/2015 1556   AST 18 12/10/2014 1012   AST 21 02/28/2009 1250   ALT 7 08/20/2015 1556   ALT 13 12/10/2014 1012   ALT 19 02/28/2009 1250   BILITOT 0.2 08/20/2015 1556   BILITOT 0.47 12/10/2014 1012   BILITOT 0.50 02/28/2009 1250       Lab Results  Component Value Date   WBC 6.8 08/28/2015   HGB 10.5* 08/28/2015   HCT 32.7* 08/28/2015   MCV 90.6 08/28/2015   PLT 396 08/28/2015   NEUTROABS 4.9 08/28/2015     ASSESSMENT & PLAN:  Anemia of chronic disease 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 ( anemia of chronic inflammation), patient has chronic polymyalgia rheumatica I reviewed her blood counts with her.  Her hemoglobin has improved to 10.5. C-reactive protein 1.5 elevated, iron studies show ferritin 232, iron saturation 13%  I discussed with her that since her hemoglobin has improved, we can continue to watch and monitor it. I also discussed with her that anemia tends to fluctuate up and down. If it is persistently below 10 g, then we can consider doing a bone marrow biopsy. At this point we can safely watch and monitor.  Return to clinic in 1 year for follow-up  No orders of the defined types were placed in this  encounter.   The patient has a good understanding of the overall plan. she agrees with it. she will call with any problems that may develop before the next visit here.   Rulon Eisenmenger, MD 08/30/2015

## 2015-09-09 ENCOUNTER — Other Ambulatory Visit: Payer: Self-pay | Admitting: Family Medicine

## 2015-09-09 NOTE — Telephone Encounter (Signed)
It is ok  Please refill for a year

## 2015-09-09 NOTE — Telephone Encounter (Signed)
Received refill request electronically See allergy/contraindiction Last office visit 08/20/15 Is it okay to refill?

## 2015-09-10 NOTE — Telephone Encounter (Signed)
done

## 2015-09-13 ENCOUNTER — Telehealth: Payer: Self-pay | Admitting: Family Medicine

## 2015-09-13 NOTE — Telephone Encounter (Signed)
[place] Patient Name: ARDYCE IRELAN DOB: Oct 14, 1940 Initial Comment Caller states, low hemoglobin count a month ago, the hematologist did not prescribe anything, she is having general pain now and wants to know if prednisone will help again. Nurse Assessment Guidelines Guideline Title Affirmed Question Affirmed Notes Final Disposition User Clinical Call [place], Therapist, sports, Amy Comments SHE STATES THAT SHE HAD A LOW HGB LEVEL. THE HEMATOLOGIST DID NOT PRESCRIBE ANYTHING. SHE STATES THAT THE INFLAMMATION COULD CAUSE A FLUCTUATION IN THE HEMOGLOBIN. SHE IS WANTING TO KNOW IF SHE SHOULD GET ON SOME PREDNISONE WOULD HELP. SHE STATES UPPER ARM, SHOULDER AND HER BACK IS HURTING. SHE IS WANTING DR. Glori Bickers TO LET HER KNOW IF THIS WOULD BE AN OPTION FOR HER OR NOT.

## 2015-09-13 NOTE — Telephone Encounter (Signed)
She has a history of polymyalgia rheumatica  I do not want to start prednisone again unless symptoms are greatly worsening and sed rate is up  Please make lab appt next wk to check her sed rate (dx PMR)  Let's see if this is the issue

## 2015-09-16 ENCOUNTER — Other Ambulatory Visit: Payer: Medicare Other

## 2015-09-16 NOTE — Telephone Encounter (Signed)
Pt notified of Dr. Marliss Coots comments/ recommendations, lab appt scheduled for today

## 2015-09-17 ENCOUNTER — Other Ambulatory Visit (INDEPENDENT_AMBULATORY_CARE_PROVIDER_SITE_OTHER): Payer: Medicare Other

## 2015-09-17 DIAGNOSIS — M353 Polymyalgia rheumatica: Secondary | ICD-10-CM

## 2015-09-17 LAB — SEDIMENTATION RATE: SED RATE: 35 mm/h — AB (ref 0–22)

## 2015-09-20 MED ORDER — PREDNISONE 20 MG PO TABS: 20.0000 mg | ORAL_TABLET | Freq: Every day | ORAL | Status: DC

## 2015-09-20 NOTE — Addendum Note (Signed)
Addended by: Lurlean Nanny on: 09/20/2015 04:30 PM   Modules accepted: Orders

## 2015-10-08 ENCOUNTER — Encounter: Payer: Self-pay | Admitting: Family Medicine

## 2015-10-08 ENCOUNTER — Ambulatory Visit (INDEPENDENT_AMBULATORY_CARE_PROVIDER_SITE_OTHER): Payer: Medicare Other | Admitting: Family Medicine

## 2015-10-08 VITALS — BP 122/80 | HR 88 | Temp 98.5°F | Ht 64.0 in | Wt 192.2 lb

## 2015-10-08 DIAGNOSIS — I10 Essential (primary) hypertension: Secondary | ICD-10-CM

## 2015-10-08 DIAGNOSIS — R739 Hyperglycemia, unspecified: Secondary | ICD-10-CM

## 2015-10-08 DIAGNOSIS — E2839 Other primary ovarian failure: Secondary | ICD-10-CM

## 2015-10-08 DIAGNOSIS — M859 Disorder of bone density and structure, unspecified: Secondary | ICD-10-CM

## 2015-10-08 DIAGNOSIS — T380X5A Adverse effect of glucocorticoids and synthetic analogues, initial encounter: Secondary | ICD-10-CM

## 2015-10-08 DIAGNOSIS — E559 Vitamin D deficiency, unspecified: Secondary | ICD-10-CM

## 2015-10-08 DIAGNOSIS — R5382 Chronic fatigue, unspecified: Secondary | ICD-10-CM

## 2015-10-08 DIAGNOSIS — J069 Acute upper respiratory infection, unspecified: Secondary | ICD-10-CM

## 2015-10-08 DIAGNOSIS — M8580 Other specified disorders of bone density and structure, unspecified site: Secondary | ICD-10-CM

## 2015-10-08 DIAGNOSIS — M353 Polymyalgia rheumatica: Secondary | ICD-10-CM | POA: Diagnosis not present

## 2015-10-08 DIAGNOSIS — E7800 Pure hypercholesterolemia, unspecified: Secondary | ICD-10-CM

## 2015-10-08 MED ORDER — PREDNISONE 10 MG PO TABS: 10.0000 mg | ORAL_TABLET | Freq: Every day | ORAL | Status: DC

## 2015-10-08 NOTE — Progress Notes (Signed)
Pre visit review using our clinic review tool, if applicable. No additional management support is needed unless otherwise documented below in the visit note. 

## 2015-10-08 NOTE — Progress Notes (Signed)
Subjective:    Patient ID: MARILENE GOLDBERG, female    DOB: 15-Nov-1940, 75 y.o.   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  Last dexa was 1/15- due for 2 year   No falls or fractures in the past year   Fatigue - is improving  On vit D Back to hematology  Lab Results  Component Value Date   WBC 6.8 08/28/2015   HGB 10.5* 08/28/2015   HCT 32.7* 08/28/2015   MCV 90.6 08/28/2015   PLT 396 08/28/2015    Also taking some b12 also   Sinus drainage since Sunday  pnd and runny nose  Congestion - uses afrin occ (knows not to use it regularly)  Thinks she has a cold   Lab Results  Component Value Date   CREATININE 1.21* 08/20/2015   BUN 24* 08/20/2015   NA 140 08/20/2015   K 4.1 08/20/2015   CL 104 08/20/2015   CO2 25 08/20/2015   Trying to drink enough fluids    Patient Active Problem List   Diagnosis Date Noted  . Viral URI 10/08/2015  . Estrogen deficiency 10/08/2015  . Chronic fatigue 08/20/2015  . Deficiency anemia 11/17/2013  . Viral upper respiratory illness 08/14/2013  . Encounter for Medicare annual wellness exam 07/17/2013  . Muscle pain 07/17/2013  . PMR (polymyalgia rheumatica) (HCC) 07/17/2013  . Intertrigo 04/19/2013  . Rectal bleeding 04/19/2013  . Expected blood loss anemia 04/05/2013  . Obese 04/05/2013  . 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 Conjunctivitis 03/06/2013  . IBS (irritable bowel syndrome) 05/30/2012  . Anemia of chronic disease 04/16/2010  . ANXIETY 04/16/2010  . DYSPHAGIA 03/07/2009  . ESOPHAGEAL STRICTURE 03/04/2009  . GERD 03/04/2009  . Vitamin D deficiency 03/12/2008  . URINARY TRACT  INFECTION, RECURRENT 08/07/2007  . 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 FIBROCYSTIC BREAST DISEASE, HX OF 02/11/2007  . HYPERCHOLESTEROLEMIA WITH HIGH HDL 01/10/2007  . DEPRESSION 01/10/2007  . RETINAL VEIN OCCLUSION 01/10/2007  . Essential hypertension 01/10/2007  . CAD 01/10/2007  . PEPTIC ULCER DISEASE 01/10/2007  . GALLSTONE PANCREATITIS 01/10/2007  . OSTEOARTHRITIS 01/10/2007  . SKIN CANCER, HX OF 01/10/2007   Past Medical History  Diagnosis Date  . Hx of skin cancer, basal cell 2013 and 2012  . Psoriasis   . Depression   . Anxiety   . Hypertension   . Arthritis     osteoarthritis  . Peptic ulcer disease     H pylori  . Fibrocystic breast   . Osteopenia   . Anemia     of chronic disease  . Complication of anesthesia 1980 and 1982    trouble waking up after breast biopsy   Past Surgical History  Procedure Laterality Date  . Knee surgery      x3  . Nasal sinus surgery    . Esophagogastroduodenoscopy  03/2009    ulcer,HP, GERD stricture  . Spine surgery      back surgery x 3  . Spine surgery  2005    deg. disk lumber spine   . Breast  surgery    . Abdominal hysterectomy      partial in 1970s  . Cholecystectomy  2005  . Hip arthroplasty Left 2010  . Joint replacement Right 2009    knee  . Breast biopsy Left 1980 and 1982  . Total hip arthroplasty Right 04/04/2013    Procedure: RIGHT TOTAL HIP ARTHROPLASTY ANTERIOR APPROACH;  Surgeon: Mauri Pole, MD;  Location: [facility];  Service: Orthopedics;  Laterality: Right;   Social History  Substance Use Topics  . Smoking status: Never Smoker   . Smokeless tobacco: Never Used  . Alcohol Use: 0.0 oz/week    0 Standard drinks or equivalent per week     Comment: occasional   Family History  Problem Relation Age of Onset  . Arthritis Mother   . Emphysema Mother   . Hyperlipidemia Mother   . Hypertension Mother   . Heart disease Brother     CAD   Allergies  Allergen Reactions  . Ace Inhibitors      REACTION: cough  . Amoxicillin-Pot Clavulanate     REACTION: gi upset  . Oxaprozin     REACTION: unknown  . Pregabalin     REACTION: swelling  . Sulfonamide Derivatives     REACTION: UNKNOWN   Current Outpatient Prescriptions on File Prior to Visit  Medication Sig Dispense Refill  . amoxicillin (AMOXIL) 500 MG capsule     . atorvastatin (LIPITOR) 10 MG tablet Take 1 tablet (10 mg total) by mouth daily. *Please schedule a follow up appointment with Dr Glori Bickers for refills. 30 tablet 1  . busPIRone (BUSPAR) 15 MG tablet TAKE 1/2 TO 1 TABLET BY MOUTH TWICE A DAY 180 tablet 3  . Calcium-Vitamin D (CALTRATE 600 PLUS-VIT D PO) Take 1 tablet by mouth 2 (two) times daily.    . diclofenac sodium (VOLTAREN) 1 % GEL APPLY 4 GRAMS TO LOWER EXTREMITY JOINTS 4 TIMES A DAY AS NEEDED *AWAITING PA*  1  . esomeprazole (NEXIUM) 40 MG capsule TAKE 1 CAPSULE (40 MG TOTAL) BY MOUTH DAILY BEFORE BREAKFAST. 90 capsule 1  . hyoscyamine (LEVSIN, ANASPAZ) 0.125 MG tablet TAKE 1 TABLET BY MOUTH AS NEEDED. 30 tablet 4  . losartan-hydrochlorothiazide (HYZAAR) 100-25 MG tablet TAKE 1 TABLET BY MOUTH DAILY. 90 tablet 3  . Melatonin 3 MG CAPS Take 6 mg by mouth at bedtime.    Marland Kitchen oxymetazoline (AFRIN) 0.05 % nasal spray Place 2 sprays into the nose 2 (two) times daily as needed for congestion.    . raloxifene (EVISTA) 60 MG tablet Take 1 tablet (60 mg total) by mouth daily. 30 tablet 11  . traMADol (ULTRAM) 50 MG tablet     . Vitamin D, Ergocalciferol, (DRISDOL) 50000 units CAPS capsule Take 1 capsule (50,000 Units total) by mouth every 7 (seven) days. 4 capsule 2   No current facility-administered medications on file prior to visit.     Review of Systems  Constitutional: Positive for appetite change and fatigue. Negative for fever.  HENT: Positive for congestion, postnasal drip, rhinorrhea, sinus pressure, sneezing and sore throat. Negative for ear pain.   Eyes: Negative for pain and discharge.  Respiratory: Positive  for cough. Negative for shortness of breath, wheezing and stridor.   Cardiovascular: Negative for chest pain.  Gastrointestinal: Negative for nausea, vomiting and diarrhea.  Genitourinary: Negative for urgency, frequency and hematuria.  Musculoskeletal: Positive for myalgias and arthralgias.  Skin: Negative for rash.  Neurological: Positive for headaches. Negative for dizziness, weakness and light-headedness.  Psychiatric/Behavioral: Negative for confusion and dysphoric mood.       Objective:   Physical Exam  Constitutional: She appears well-developed and well-nourished. No distress.  overwt and well app  HENT:  Head: Normocephalic and atraumatic.  Right Ear: External ear normal.  Left Ear: External ear normal.  Mouth/Throat: Oropharynx is clear and moist.  Nares are injected and congested  No sinus tenderness Clear rhinorrhea and post nasal drip   Eyes: Conjunctivae and EOM are normal. Pupils are equal, round, and reactive to light. Right eye exhibits no discharge. Left eye exhibits no discharge.  Neck: Normal range of motion. Neck supple.  Cardiovascular: Normal rate and normal heart sounds.   Pulmonary/Chest: Effort normal and breath sounds normal. No respiratory distress. She has no wheezes. She has no rales. She exhibits no tenderness.  Musculoskeletal: She exhibits tenderness. She exhibits no edema.  Mild tenderness in trapezius/cervical musculature and quadriceps areas  No acute joint swelling or deformity Pt states she is much improved   Lymphadenopathy:    She has no cervical adenopathy.  Neurological: She is alert. She has normal reflexes. No cranial nerve deficit. She exhibits normal muscle tone. Coordination normal.  Skin: Skin is warm and dry. No rash noted.  Psychiatric: She has a normal mood and affect.          Assessment & Plan:   Problem List Items Addressed This Visit      Cardiovascular and Mediastinum   Essential hypertension - Primary    bp in  fair control at this time  BP Readings from Last 1 Encounters:  10/08/15 122/80   No changes needed Disc lifstyle change with low sodium diet and exercise  Labs planned         Respiratory   Viral URI    Reassuring exam Disc symptomatic care - see instructions on AVS Update if not starting to improve in a week or if worsening          Musculoskeletal and Integument   Steroid-induced osteopenia    Worrisome in light of continued need for prednisone for PMR Disc need for calcium/ vitamin D/ wt bearing exercise and bone density test every 2 y to monitor Disc safety/ fracture risk in detail   Will disc dexa at f/u (study ordered)        Other   Chronic fatigue    Suspect multifactorial Rev labs Disc supplements and vit D  Also activity/exercise       Estrogen deficiency   Relevant Orders   DG Bone Density   PMR (polymyalgia rheumatica) (HCC)    Improved shoulder girdle and leg pain on prednisone 20  Lab Results  Component Value Date   ESRSEDRATE 35* 09/17/2015    Will continue 2 more weeks and then attempt to cut to 10 mg daily  Aware of side eff/long term eff of prednisone -need to watch bone density and glucose  F/u planned -will wean slowly if able       Vitamin D deficiency    In pt at risk of OP Check with next labs Disc intake/supplementation

## 2015-10-08 NOTE — Patient Instructions (Addendum)
Schedule non fasting labs for 2  mo (for blood sugar and vitamin D and sed rate) and then f/u Continue prednisone 20 mg daily for 2 more weeks and then cut it to 10 mg daily  Stay active  Continue current medicines

## 2015-10-10 DIAGNOSIS — T380X5A Adverse effect of glucocorticoids and synthetic analogues, initial encounter: Secondary | ICD-10-CM

## 2015-10-10 DIAGNOSIS — R739 Hyperglycemia, unspecified: Secondary | ICD-10-CM | POA: Insufficient documentation

## 2015-10-10 NOTE — Assessment & Plan Note (Signed)
bp in fair control at this time  BP Readings from Last 1 Encounters:  10/08/15 122/80   No changes needed Disc lifstyle change with low sodium diet and exercise  Labs planned

## 2015-10-10 NOTE — Assessment & Plan Note (Signed)
In pt at risk of OP Check with next labs Disc intake/supplementation

## 2015-10-10 NOTE — Assessment & Plan Note (Signed)
Suspect multifactorial Rev labs Disc supplements and vit D  Also activity/exercise

## 2015-10-10 NOTE — Addendum Note (Signed)
Addended by: Ellamae Sia on: 10/10/2015 10:04 AM   Modules accepted: Orders

## 2015-10-10 NOTE — Assessment & Plan Note (Signed)
Reassuring exam Disc symptomatic care - see instructions on AVS Update if not starting to improve in a week or if worsening

## 2015-10-10 NOTE — Assessment & Plan Note (Addendum)
Worrisome in light of continued need for prednisone for PMR Disc need for calcium/ vitamin D/ wt bearing exercise and bone density test every 2 y to monitor Disc safety/ fracture risk in detail   Will disc dexa at f/u (study ordered)

## 2015-10-10 NOTE — Assessment & Plan Note (Signed)
Improved shoulder girdle and leg pain on prednisone 20  Lab Results  Component Value Date   ESRSEDRATE 35* 09/17/2015    Will continue 2 more weeks and then attempt to cut to 10 mg daily  Aware of side eff/long term eff of prednisone -need to watch bone density and glucose  F/u planned -will wean slowly if able

## 2015-10-14 ENCOUNTER — Encounter: Payer: Self-pay | Admitting: Gastroenterology

## 2015-10-15 ENCOUNTER — Other Ambulatory Visit: Payer: Self-pay | Admitting: Family Medicine

## 2015-10-15 NOTE — Telephone Encounter (Signed)
Patient due for CPE.  Left message to call back and schedule.  Refill x30 days.

## 2015-10-16 LAB — HM MAMMOGRAPHY: HM MAMMO: NORMAL

## 2015-10-17 ENCOUNTER — Encounter: Payer: Self-pay | Admitting: Family Medicine

## 2015-10-18 ENCOUNTER — Encounter: Payer: Self-pay | Admitting: Family Medicine

## 2015-10-18 ENCOUNTER — Encounter: Payer: Self-pay | Admitting: *Deleted

## 2015-10-31 ENCOUNTER — Encounter: Payer: Self-pay | Admitting: *Deleted

## 2015-11-29 ENCOUNTER — Other Ambulatory Visit: Payer: Self-pay | Admitting: Family Medicine

## 2015-12-10 ENCOUNTER — Telehealth: Payer: Self-pay

## 2015-12-10 ENCOUNTER — Other Ambulatory Visit (INDEPENDENT_AMBULATORY_CARE_PROVIDER_SITE_OTHER): Payer: Medicare Other

## 2015-12-10 DIAGNOSIS — M353 Polymyalgia rheumatica: Secondary | ICD-10-CM | POA: Diagnosis not present

## 2015-12-10 DIAGNOSIS — E7800 Pure hypercholesterolemia, unspecified: Secondary | ICD-10-CM | POA: Diagnosis not present

## 2015-12-10 DIAGNOSIS — E2839 Other primary ovarian failure: Secondary | ICD-10-CM

## 2015-12-10 DIAGNOSIS — R5382 Chronic fatigue, unspecified: Secondary | ICD-10-CM | POA: Diagnosis not present

## 2015-12-10 DIAGNOSIS — E559 Vitamin D deficiency, unspecified: Secondary | ICD-10-CM | POA: Diagnosis not present

## 2015-12-10 DIAGNOSIS — M859 Disorder of bone density and structure, unspecified: Secondary | ICD-10-CM

## 2015-12-10 DIAGNOSIS — R739 Hyperglycemia, unspecified: Secondary | ICD-10-CM

## 2015-12-10 DIAGNOSIS — J069 Acute upper respiratory infection, unspecified: Secondary | ICD-10-CM

## 2015-12-10 DIAGNOSIS — T380X5A Adverse effect of glucocorticoids and synthetic analogues, initial encounter: Principal | ICD-10-CM

## 2015-12-10 DIAGNOSIS — M8580 Other specified disorders of bone density and structure, unspecified site: Secondary | ICD-10-CM

## 2015-12-10 NOTE — Telephone Encounter (Signed)
Done and in IN box 

## 2015-12-10 NOTE — Telephone Encounter (Signed)
[name] [name] [phone]  [place] dropped off a disability parking placard to be filled out. Call when ready. Placed in RX boxes in the front.

## 2015-12-10 NOTE — Telephone Encounter (Signed)
Pt notified form ready for pick-up and copy sent to scanning

## 2015-12-10 NOTE — Telephone Encounter (Signed)
Form placed in Dr. Marliss Coots inbox

## 2015-12-11 ENCOUNTER — Other Ambulatory Visit: Payer: Medicare Other

## 2015-12-11 LAB — VITAMIN D 25 HYDROXY (VIT D DEFICIENCY, FRACTURES): VITD: 24.64 ng/mL — ABNORMAL LOW (ref 30.00–100.00)

## 2015-12-11 LAB — SEDIMENTATION RATE: SED RATE: 31 mm/h — AB (ref 0–22)

## 2015-12-11 LAB — LIPID PANEL
CHOL/HDL RATIO: 5
CHOLESTEROL: 202 mg/dL — AB (ref 0–200)
HDL: 37.6 mg/dL — AB (ref >39.00)
NonHDL: 164.06
Triglycerides: 313 mg/dL — ABNORMAL HIGH (ref 0.0–149.0)
VLDL: 62.6 mg/dL — AB (ref 0.0–40.0)

## 2015-12-11 LAB — HEMOGLOBIN A1C: Hgb A1c MFr Bld: 6 % (ref 4.6–6.5)

## 2015-12-11 LAB — LDL CHOLESTEROL, DIRECT: LDL DIRECT: 107 mg/dL

## 2015-12-17 ENCOUNTER — Other Ambulatory Visit: Payer: Medicare Other

## 2015-12-17 ENCOUNTER — Ambulatory Visit: Payer: Medicare Other | Admitting: Hematology and Oncology

## 2016-03-09 ENCOUNTER — Telehealth: Payer: Self-pay | Admitting: Family Medicine

## 2016-03-09 DIAGNOSIS — M353 Polymyalgia rheumatica: Secondary | ICD-10-CM

## 2016-03-09 MED ORDER — PREDNISONE 10 MG PO TABS: ORAL_TABLET | ORAL | Status: DC

## 2016-03-09 NOTE — Telephone Encounter (Signed)
Please let her know I sent the px for prednisone  Also did rheum ref Will cc to PCP

## 2016-03-09 NOTE — Telephone Encounter (Signed)
If this is still PMR then prednisone is about the only thing that helps  Has she seen a rheumatologist in the past?  I wonder how much of this is the polymyalgia and how much may be arthritis Let me know - I would like to refer her  Also get her started back on prednisone if needed Please ask her how quickly pain rebounded when she finished the prednisone-thanks

## 2016-03-09 NOTE — Telephone Encounter (Signed)
Patient notified as instructed by telephone and verbalized understanding. Patient stated that Dr. Glori Bickers is her PCP.

## 2016-03-09 NOTE — Telephone Encounter (Addendum)
Pt is having constant dull pain in shoulders, arms and legs; pt took all refills of prednisone and finished last refill last month. Pt wants different med for pain. Pain level now is 8; pt would schedule appt but do not see 30 min f/u in next 2 weeks. Pt said Tramadol does not help her pain and she does not take that med anymore. Pt request cb. [facility].

## 2016-03-09 NOTE — Telephone Encounter (Signed)
Please call patient and triage.

## 2016-03-09 NOTE — Telephone Encounter (Signed)
Patient would like a return call from the [facility] concerning her medications because she is in a lot of pain.

## 2016-03-09 NOTE — Telephone Encounter (Signed)
Patient notified as instructed by telephone and verbalized understanding. Patient stated that she has not seen a rheumatologist and would like a referral. Patient stated that her pain started back within 2 weeks of stopping the Prednisone and has been hurting for about a month now. Patient stated that she took all of the Prednisone and would like a new script sent to [facility]. Advised patient that she will hear back from the referral coordinator regarding the referral.

## 2016-06-19 ENCOUNTER — Other Ambulatory Visit: Payer: Self-pay | Admitting: Family Medicine

## 2016-06-22 NOTE — Telephone Encounter (Signed)
Please refill times one  

## 2016-06-22 NOTE — Telephone Encounter (Signed)
done

## 2016-06-22 NOTE — Telephone Encounter (Signed)
Received refill electronically Last office visit 10/08/15 Medication is not on medication list

## 2016-07-18 ENCOUNTER — Other Ambulatory Visit: Payer: Self-pay | Admitting: Family Medicine

## 2016-08-11 ENCOUNTER — Ambulatory Visit (INDEPENDENT_AMBULATORY_CARE_PROVIDER_SITE_OTHER): Payer: Medicare Other

## 2016-08-11 VITALS — BP 110/66 | HR 103 | Temp 97.4°F | Ht 64.0 in | Wt 204.5 lb

## 2016-08-11 DIAGNOSIS — Z0000 Encounter for general adult medical examination without abnormal findings: Secondary | ICD-10-CM | POA: Diagnosis not present

## 2016-08-11 NOTE — Progress Notes (Signed)
Pre visit review using our clinic review tool, if applicable. No additional management support is needed unless otherwise documented below in the visit note. 

## 2016-08-11 NOTE — Progress Notes (Signed)
PCP notes:   Health maintenance:  Flu vaccine - pt is ill today PCV13 - pt is ill today  Abnormal screenings:   Fall risk - hx of fall with injury Hearing screen - pt will have hearing assessed at next appt  Patient concerns:   Pt is not feeling well today. Also, pt reports SOB when walking short distances. Pt declined an acute visit with PCP.   Nurse concerns:  Pt stated she recently had labs taken at Dr. Trudie Reed' office. Patient record authorization obtained and sent to office for results to be sent to PCP for review.   Next PCP appt:   08/19/2016 @ 1730  I reviewed health advisor's note, was available for consultation, and agree with documentation and plan. [name] MD

## 2016-08-11 NOTE — Patient Instructions (Signed)
[name] [name] , Thank you for taking time to come for your Medicare Wellness Visit. I appreciate your ongoing commitment to your health goals. Please review the following plan we discussed and let me know if I can assist you in the future.   These are the goals we discussed: Goals    . water          Starting 08/11/2016, I will attempt to drink at least 6-8 glasses of water daily.        This is a list of the screening recommended for you and due dates:  Health Maintenance  Topic Date Due  . Flu Shot  11/21/2016*  . Pneumonia vaccines (2 of 2 - PCV13) 11/21/2016*  . Mammogram  10/15/2016  . Tetanus Vaccine  01/25/2018  . DEXA scan (bone density measurement)  Completed  . Shingles Vaccine  Addressed  *Topic was postponed. The date shown is not the original due date.   Preventive Care for Adults  A healthy lifestyle and preventive care can promote health and wellness. Preventive health guidelines for adults include the following key practices.  . A routine yearly physical is a good way to check with your health care provider about your health and preventive screening. It is a chance to share any concerns and updates on your health and to receive a thorough exam.  . Visit your dentist for a routine exam and preventive care every 6 months. Brush your teeth twice a day and floss once a day. Good oral hygiene prevents tooth decay and gum disease.  . The frequency of eye exams is based on your age, health, family medical history, use  of contact lenses, and other factors. Follow your health care provider's ecommendations for frequency of eye exams.  . Eat a healthy diet. Foods like vegetables, fruits, whole grains, low-fat dairy products, and lean protein foods contain the nutrients you need without too many calories. Decrease your intake of foods high in solid fats, added sugars, and salt. Eat the right amount of calories for you. Get information about a proper diet from your health care  provider, if necessary.  . Regular physical exercise is one of the most important things you can do for your health. Most adults should get at least 150 minutes of moderate-intensity exercise (any activity that increases your heart rate and causes you to sweat) each week. In addition, most adults need muscle-strengthening exercises on 2 or more days a week.  Silver Sneakers may be a benefit available to you. To determine eligibility, you may visit the website: www.silversneakers.com or contact program at 252-657-8081 Mon-Fri between 8AM-8PM.   . Maintain a healthy weight. The body mass index (BMI) is a screening tool to identify possible weight problems. It provides an estimate of body fat based on height and weight. Your health care provider can find your BMI and can help you achieve or maintain a healthy weight.   For adults 20 years and older: ? A BMI below 18.5 is considered underweight. ? A BMI of 18.5 to 24.9 is normal. ? A BMI of 25 to 29.9 is considered overweight. ? A BMI of 30 and above is considered obese.   . Maintain normal blood lipids and cholesterol levels by exercising and minimizing your intake of saturated fat. Eat a balanced diet with plenty of fruit and vegetables. Blood tests for lipids and cholesterol should begin at age 35 and be repeated every 5 years. If your lipid or cholesterol levels are high, you  are over 26, or you are at high risk for heart disease, you may need your cholesterol levels checked more frequently. Ongoing high lipid and cholesterol levels should be treated with medicines if diet and exercise are not working.  . If you smoke, find out from your health care provider how to quit. If you do not use tobacco, please do not start.  . If you choose to drink alcohol, please do not consume more than 2 drinks per day. One drink is considered to be 12 ounces (355 mL) of beer, 5 ounces (148 mL) of wine, or 1.5 ounces (44 mL) of liquor.  . If you are 76-76 years  old, ask your health care provider if you should take aspirin to prevent strokes.  . Use sunscreen. Apply sunscreen liberally and repeatedly throughout the day. You should seek shade when your shadow is shorter than you. Protect yourself by wearing long sleeves, pants, a wide-brimmed hat, and sunglasses year round, whenever you are outdoors.  . Once a month, do a whole body skin exam, using a mirror to look at the skin on your back. Tell your health care provider of new moles, moles that have irregular borders, moles that are larger than a pencil eraser, or moles that have changed in shape or color.

## 2016-08-11 NOTE — Progress Notes (Signed)
Subjective:   [name] [name] is a 75 y.o. female who presents for Medicare Annual (Subsequent) preventive examination.  Review of Systems:  N/A Cardiac Risk Factors include: advanced age (>36men, >65 women);obesity (BMI >30kg/m2)     Objective:     Vitals: BP 110/66 (BP Location: Left Arm, Patient Position: Sitting, Cuff Size: Normal)   Pulse (!) 103   Temp 97.4 F (36.3 C) (Oral)   Ht 5\' 4"  (1.626 m) Comment: no shoes  Wt 204 lb 8 oz (92.8 kg)   SpO2 94%   BMI 35.10 kg/m   Body mass index is 35.1 kg/m.   Tobacco History  Smoking Status  . Never Smoker  Smokeless Tobacco  . Never Used     Counseling given: No   Past Medical History:  Diagnosis Date  . Anemia    of chronic disease  . Anxiety   . Arthritis    osteoarthritis  . Complication of anesthesia 1980 and 1982   trouble waking up after breast biopsy  . Depression   . Fibrocystic breast   . Hx of skin cancer, basal cell 2013 and 2012  . Hypertension   . Osteopenia   . Peptic ulcer disease    H pylori  . Psoriasis    Past Surgical History:  Procedure Laterality Date  . ABDOMINAL HYSTERECTOMY     partial in 1970s  . BREAST BIOPSY Left 1980 and 1982  . BREAST SURGERY    . CATARACT EXTRACTION, BILATERAL  July and August2017  . CHOLECYSTECTOMY  2005  . ESOPHAGOGASTRODUODENOSCOPY  03/2009   ulcer,HP, GERD stricture  . HIP ARTHROPLASTY Left 2010  . JOINT REPLACEMENT Right 2009   knee  . KNEE SURGERY     x3  . NASAL SINUS SURGERY    . SPINE SURGERY     back surgery x 3  . SPINE SURGERY  2005   deg. disk lumber spine   . TOTAL HIP ARTHROPLASTY Right 04/04/2013   Procedure: RIGHT TOTAL HIP ARTHROPLASTY ANTERIOR APPROACH;  Surgeon: Mauri Pole, MD;  Location: [facility];  Service: Orthopedics;  Laterality: Right;   Family History  Problem Relation Age of Onset  . Arthritis Mother   . Emphysema Mother   . Hyperlipidemia Mother   . Hypertension Mother   . Heart disease Brother     CAD    History  Sexual Activity  . Sexual activity: Not Currently    Outpatient Encounter Prescriptions as of 08/11/2016  Medication Sig  . busPIRone (BUSPAR) 15 MG tablet TAKE 1/2 TO 1 TABLET BY MOUTH TWICE A DAY  . Calcium-Vitamin D (CALTRATE 600 PLUS-VIT D PO) Take 1 tablet by mouth 2 (two) times daily.  Marland Kitchen esomeprazole (NEXIUM) 40 MG capsule TAKE 1 CAPSULE EVERY DAY  . FOLIC ACID PO Take 1 tablet by mouth daily.  . hyoscyamine (LEVSIN, ANASPAZ) 0.125 MG tablet TAKE 1 TABLET BY MOUTH AS NEEDED.  Marland Kitchen losartan-hydrochlorothiazide (HYZAAR) 100-25 MG tablet TAKE 1 TABLET BY MOUTH DAILY.  . Melatonin 3 MG CAPS Take 6 mg by mouth at bedtime.  . methotrexate (50 MG/ML) 1 g injection Inject 0.4 mg into the vein once a week.  Marland Kitchen oxymetazoline (AFRIN) 0.05 % nasal spray Place 2 sprays into the nose 2 (two) times daily as needed for congestion.  Marland Kitchen amoxicillin (AMOXIL) 500 MG capsule Take 2,000 mg by mouth as needed.   Marland Kitchen atorvastatin (LIPITOR) 10 MG tablet Take 1 tablet (10 mg total) by mouth daily. *Please schedule  a follow up appointment with Dr Glori Bickers for refills. (Patient not taking: Reported on 08/11/2016)  . clotrimazole-betamethasone (LOTRISONE) cream APPLY TO AFFECTED AREA TWICE A DAY UNTIL CLEARED (Patient not taking: Reported on 08/11/2016)  . diclofenac sodium (VOLTAREN) 1 % GEL APPLY 4 GRAMS TO LOWER EXTREMITY JOINTS 4 TIMES A DAY AS NEEDED *AWAITING PA*  . predniSONE (DELTASONE) 10 MG tablet Take 2 tabs by mouth once daily for 2 weeks and then decrease to 1 tablet daily (Patient not taking: Reported on 08/11/2016)  . raloxifene (EVISTA) 60 MG tablet Take 1 tablet (60 mg total) by mouth daily. (Patient not taking: Reported on 08/11/2016)  . traMADol (ULTRAM) 50 MG tablet   . Vitamin D, Ergocalciferol, (DRISDOL) 50000 units CAPS capsule Take 1 capsule (50,000 Units total) by mouth every 7 (seven) days. (Patient not taking: Reported on 08/11/2016)   No facility-administered encounter medications on  file as of 08/11/2016.     Activities of Daily Living In your present state of health, do you have any difficulty performing the following activities: 08/11/2016  Hearing? N  Vision? N  Difficulty concentrating or making decisions? N  Walking or climbing stairs? Y  Dressing or bathing? N  Doing errands, shopping? N  Preparing Food and eating ? N  Using the Toilet? N  In the past six months, have you accidently leaked urine? Y  Do you have problems with loss of bowel control? Y  Managing your Medications? N  Managing your Finances? N  Housekeeping or managing your Housekeeping? N  Some recent data might be hidden    Patient Care Team: Abner Greenspan, MD as PCP - General Suella Broad, MD as Consulting Physician (Physical Medicine and Rehabilitation) Gavin Pound, MD as Consulting Physician (Rheumatology) Devra Dopp, MD as Referring Physician (Dermatology) Nicholas Lose, MD as Consulting Physician (Hematology and Oncology)    Assessment:    Vision Screening Comments: Last vision in Sept 2017 with Dr. Alain Honey  Exercise Activities and Dietary recommendations Current Exercise Habits: The patient does not participate in regular exercise at present, Exercise limited by: None identified;orthopedic condition(s);respiratory conditions(s)  Goals    . water          Starting 08/11/2016, I will attempt to drink at least 6-8 glasses of water daily.       Fall Risk Fall Risk  08/11/2016 07/17/2013  Falls in the past year? Yes No  Number falls in past yr: 1 -  Injury with Fall? Yes -  Follow up Falls evaluation completed -   Depression Screen PHQ 2/9 Scores 08/11/2016 07/17/2013  PHQ - 2 Score 1 0     Cognitive Function MMSE - Mini Mental State Exam 08/11/2016  Orientation to time 5  Orientation to Place 5  Registration 3  Attention/ Calculation 0  Recall 3  Language- name 2 objects 0  Language- repeat 1  Language- follow 3 step command 3  Language-  read & follow direction 0  Write a sentence 0  Copy design 0  Total score 20     PLEASE NOTE: A Mini-Cog screen was completed. Maximum score is 20. A value of 0 denotes this part of Folstein MMSE was not completed or the patient failed this part of the Mini-Cog screening.   Mini-Cog Screening Orientation to Time - Max 5 pts Orientation to Place - Max 5 pts Registration - Max 3 pts Recall - Max 3 pts Language Repeat - Max 1 pts Language Follow 3 Step Command -  Max 3 pts     Immunization History  Administered Date(s) Administered  . Influenza,inj,Quad PF,36+ Mos 07/17/2013  . Pneumococcal Polysaccharide-23 01/26/2008, 04/16/2010  . Td 03/28/1998, 01/26/2008   Screening Tests Health Maintenance  Topic Date Due  . INFLUENZA VACCINE  11/21/2016 (Originally 03/24/2016)  . PNA vac Low Risk Adult (2 of 2 - PCV13) 11/21/2016 (Originally 04/17/2011)  . MAMMOGRAM  10/15/2016  . TETANUS/TDAP  01/25/2018  . DEXA SCAN  Completed  . ZOSTAVAX  Addressed      Plan:     I have personally reviewed and addressed the Medicare Annual Wellness questionnaire and have noted the following in the patient's chart:  A. Medical and social history B. Use of alcohol, tobacco or illicit drugs  C. Current medications and supplements D. Functional ability and status E.  Nutritional status F.  Physical activity G. Advance directives H. List of other physicians I.  Hospitalizations, surgeries, and ER visits in previous 12 months J.  [place] to include hearing, vision, cognitive, depression L. Referrals and appointments - none  In addition, I have reviewed and discussed with patient certain preventive protocols, quality metrics, and best practice recommendations. A written personalized care plan for preventive services as well as general preventive health recommendations were provided to patient.  See attached scanned questionnaire for additional information.   Signed,   Lindell Noe,  MHA, BS, LPN Health Coach

## 2016-08-12 ENCOUNTER — Other Ambulatory Visit: Payer: Medicare Other

## 2016-08-12 ENCOUNTER — Ambulatory Visit: Payer: Medicare Other

## 2016-08-13 ENCOUNTER — Ambulatory Visit: Payer: Medicare Other

## 2016-08-19 ENCOUNTER — Ambulatory Visit (INDEPENDENT_AMBULATORY_CARE_PROVIDER_SITE_OTHER)
Admission: RE | Admit: 2016-08-19 | Discharge: 2016-08-19 | Disposition: A | Discharge status: Home / Self Care | Payer: Medicare Other | Source: Ambulatory Visit | Attending: Family Medicine | Admitting: Family Medicine

## 2016-08-19 ENCOUNTER — Encounter: Payer: Self-pay | Admitting: Family Medicine

## 2016-08-19 ENCOUNTER — Ambulatory Visit (INDEPENDENT_AMBULATORY_CARE_PROVIDER_SITE_OTHER): Payer: Medicare Other | Admitting: Family Medicine

## 2016-08-19 VITALS — BP 122/78 | HR 100 | Temp 98.5°F | Ht 64.0 in | Wt 204.4 lb

## 2016-08-19 DIAGNOSIS — R0602 Shortness of breath: Secondary | ICD-10-CM

## 2016-08-19 DIAGNOSIS — I447 Left bundle-branch block, unspecified: Secondary | ICD-10-CM

## 2016-08-19 DIAGNOSIS — R0609 Other forms of dyspnea: Secondary | ICD-10-CM | POA: Diagnosis not present

## 2016-08-19 DIAGNOSIS — E785 Hyperlipidemia, unspecified: Secondary | ICD-10-CM | POA: Diagnosis not present

## 2016-08-19 DIAGNOSIS — I10 Essential (primary) hypertension: Secondary | ICD-10-CM | POA: Diagnosis not present

## 2016-08-19 DIAGNOSIS — D638 Anemia in other chronic diseases classified elsewhere: Secondary | ICD-10-CM | POA: Diagnosis not present

## 2016-08-19 DIAGNOSIS — E786 Lipoprotein deficiency: Secondary | ICD-10-CM

## 2016-08-19 DIAGNOSIS — I2510 Atherosclerotic heart disease of native coronary artery without angina pectoris: Secondary | ICD-10-CM

## 2016-08-19 MED ORDER — LOSARTAN POTASSIUM-HCTZ 100-25 MG PO TABS: 1.0000 | ORAL_TABLET | Freq: Every day | ORAL | 3 refills | Status: DC

## 2016-08-19 MED ORDER — RALOXIFENE HCL 60 MG PO TABS: 60.0000 mg | ORAL_TABLET | Freq: Every day | ORAL | 3 refills | Status: DC

## 2016-08-19 MED ORDER — ESOMEPRAZOLE MAGNESIUM 40 MG PO CPDR: 40.0000 mg | DELAYED_RELEASE_CAPSULE | Freq: Every day | ORAL | 3 refills | Status: DC

## 2016-08-19 NOTE — Progress Notes (Signed)
Pre visit review using our clinic review tool, if applicable. No additional management support is needed unless otherwise documented below in the visit note. 

## 2016-08-19 NOTE — Progress Notes (Signed)
Subjective:    Patient ID: [name] [name], female    DOB: [birth year], 75 y.o.   MRN: [id number]  HPI  Here for acute visit for sob on exertion and fatigue   (originally a health mt exam but decided on office visit due to symptoms)    She has felt lousy for 2 -3 weeks  Sinus problems in the beginning  No energy  She gets out of breath easily -not functioning    Last cardiac visit was 2001 - had a catheterization - mild cad/ nothing req treatment  She was put on toprol and she did not tolerate it  Had not had an MI Has hx of hyperlipidemia    Wt Readings from Last 3 Encounters:  08/19/16 204 lb 6.4 oz (92.7 kg)  08/11/16 204 lb 8 oz (92.8 kg)  10/08/15 192 lb 4 oz (87.2 kg)  up from a year ago  On prednison bmi is 35.0  BP Readings from Last 3 Encounters:  08/19/16 122/78  08/11/16 110/66  10/08/15 122/80    Seeing rheumatologist  On 10 mg of prednisone for a while  Also on methotrexate - dr Trudie Reed   No new change in blood count   Heart disease in family - brother  High chol and HTN in the rest of her family   No chest pressure or pain  Cannot walk more than a parking space w/o sob  Can hear herself breathe (? If wheeze)  She gets sob when she lies flat  No pedal edema at all  A little nauseated at times No sweating     Hx of hyperlipidemia Lab Results  Component Value Date   CHOL 202 (H) 12/10/2015   HDL 37.60 (L) 12/10/2015   LDLCALC 97 05/25/2012   LDLDIRECT 107.0 12/10/2015   TRIG 313.0 (H) 12/10/2015   CHOLHDL 5 12/10/2015    Hx of anemia  Of chronic dz  At rheumatology office last Hb was 10.4 From our lab Lab Results  Component Value Date   WBC 6.8 08/28/2015   HGB 10.5 (L) 08/28/2015   HCT 32.7 (L) 08/28/2015   MCV 90.6 08/28/2015   PLT 396 08/28/2015    EKG shows NSR with rate of 94 and LBBB The LBBB is new   Pulse ox is 95% on RA  Patient Active Problem List   Diagnosis Date Noted  . Dyspnea on exertion 08/19/2016  . Left  bundle branch block (LBBB) on electrocardiogram 08/19/2016  . Steroid-induced hyperglycemia 10/10/2015  . Estrogen deficiency 10/08/2015  . Chronic fatigue 08/20/2015  . Deficiency anemia 11/17/2013  . Encounter for Medicare annual wellness exam 07/17/2013  . Muscle pain 07/17/2013  . PMR (polymyalgia rheumatica) (HCC) 07/17/2013  . Intertrigo 04/19/2013  . Rectal bleeding 04/19/2013  . Expected blood loss anemia 04/05/2013  . Obese 04/05/2013  . 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 Conjunctivitis 03/06/2013  . IBS (irritable bowel syndrome) 05/30/2012  . Anemia of chronic disease 04/16/2010  . ANXIETY 04/16/2010  . DYSPHAGIA 03/07/2009  . ESOPHAGEAL STRICTURE 03/04/2009  . GERD 03/04/2009  . Vitamin D deficiency 03/12/2008  . URINARY TRACT INFECTION, RECURRENT 08/07/2007  . 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 FIBROCYSTIC BREAST DISEASE, HX OF 02/11/2007  . HYPERCHOLESTEROLEMIA WITH HIGH HDL 01/10/2007  . DEPRESSION 01/10/2007  . RETINAL VEIN OCCLUSION 01/10/2007  . Essential hypertension 01/10/2007  . CAD 01/10/2007  . PEPTIC ULCER DISEASE 01/10/2007  . GALLSTONE PANCREATITIS 01/10/2007  . OSTEOARTHRITIS 01/10/2007  . SKIN CANCER,  HX OF 01/10/2007   Past Medical History:  Diagnosis Date  . Anemia    of chronic disease  . Anxiety   . Arthritis    osteoarthritis  . Complication of anesthesia 1980 and 1982   trouble waking up after breast biopsy  . Depression   . Fibrocystic breast   . Hx of skin cancer, basal cell 2013 and 2012  . Hypertension   . Osteopenia   . Peptic ulcer disease    H pylori  . Psoriasis    Past Surgical History:  Procedure Laterality Date  . ABDOMINAL HYSTERECTOMY     partial in 1970s  . BREAST BIOPSY Left 1980 and 1982  . BREAST SURGERY    . CATARACT EXTRACTION, BILATERAL  July and August2017  . CHOLECYSTECTOMY  2005  . ESOPHAGOGASTRODUODENOSCOPY  03/2009   ulcer,HP, GERD stricture  . HIP ARTHROPLASTY Left 2010  . JOINT REPLACEMENT  Right 2009   knee  . KNEE SURGERY     x3  . NASAL SINUS SURGERY    . SPINE SURGERY     back surgery x 3  . SPINE SURGERY  2005   deg. disk lumber spine   . TOTAL HIP ARTHROPLASTY Right 04/04/2013   Procedure: RIGHT TOTAL HIP ARTHROPLASTY ANTERIOR APPROACH;  Surgeon: Mauri Pole, MD;  Location: [facility];  Service: Orthopedics;  Laterality: Right;   Social History  Substance Use Topics  . Smoking status: Never Smoker  . Smokeless tobacco: Never Used  . Alcohol use 0.0 oz/week     Comment: occasional   Family History  Problem Relation Age of Onset  . Arthritis Mother   . Emphysema Mother   . Hyperlipidemia Mother   . Hypertension Mother   . Heart disease Brother     CAD   Allergies  Allergen Reactions  . Ace Inhibitors     REACTION: cough  . Amoxicillin-Pot Clavulanate     REACTION: gi upset  . Oxaprozin     REACTION: unknown  . Pregabalin     REACTION: swelling  . Sulfonamide Derivatives     REACTION: UNKNOWN   Current Outpatient Prescriptions on File Prior to Visit  Medication Sig Dispense Refill  . busPIRone (BUSPAR) 15 MG tablet TAKE 1/2 TO 1 TABLET BY MOUTH TWICE A DAY 180 tablet 0  . Calcium-Vitamin D (CALTRATE 600 PLUS-VIT D PO) Take 1 tablet by mouth 2 (two) times daily.    . diclofenac sodium (VOLTAREN) 1 % GEL APPLY 4 GRAMS TO LOWER EXTREMITY JOINTS 4 TIMES A DAY AS NEEDED *AWAITING PA*  1  . FOLIC ACID PO Take 1 tablet by mouth daily.    . hyoscyamine (LEVSIN, ANASPAZ) 0.125 MG tablet TAKE 1 TABLET BY MOUTH AS NEEDED. 30 tablet 4  . Melatonin 3 MG CAPS Take 6 mg by mouth at bedtime.    . methotrexate (50 MG/ML) 1 g injection Inject 0.4 mg into the vein once a week.    Marland Kitchen oxymetazoline (AFRIN) 0.05 % nasal spray Place 2 sprays into the nose 2 (two) times daily as needed for congestion.    . predniSONE (DELTASONE) 10 MG tablet Take 2 tabs by mouth once daily for 2 weeks and then decrease to 1 tablet daily 60 tablet 1  . traMADol (ULTRAM) 50 MG tablet     .  Vitamin D, Ergocalciferol, (DRISDOL) 50000 units CAPS capsule Take 1 capsule (50,000 Units total) by mouth every 7 (seven) days. 4 capsule 2  . amoxicillin (AMOXIL) 500  MG capsule Take 2,000 mg by mouth as needed.     Marland Kitchen atorvastatin (LIPITOR) 10 MG tablet Take 1 tablet (10 mg total) by mouth daily. *Please schedule a follow up appointment with Dr Glori Bickers for refills. (Patient not taking: Reported on 08/19/2016) 30 tablet 1  . clotrimazole-betamethasone (LOTRISONE) cream APPLY TO AFFECTED AREA TWICE A DAY UNTIL CLEARED (Patient not taking: Reported on 08/19/2016) 30 g 1   No current facility-administered medications on file prior to visit.     Review of Systems Review of Systems  Constitutional: Negative for fever, appetite change,  and unexpected weight change. Pos for fatigue acute on chronic  ENT pos for sinus pressure and congestion with pnd  Eyes: Negative for pain and visual disturbance.  Respiratory: pos for sob on exertion/ pos for occ throat clearing and cough   Cardiovascular: Negative for cp or palpitations   pos for orthopnea, neg for pnd or pedal edema Gastrointestinal: Negative for , diarrhea and constipation. pos for occ nausea/neg for abd pain or dark stools  Genitourinary: Negative for urgency and frequency.  Skin: Negative for pallor or rash   Neurological: Negative for weakness, light-headedness, numbness and headaches.  Hematological: Negative for adenopathy. Does not bruise/bleed easily.  Psychiatric/Behavioral: Negative for dysphoric mood. The patient is not nervous/anxious.  pos for stressors        Objective:   Physical Exam  Constitutional: She appears well-developed and well-nourished. No distress.  Obese Notably sob on exertion   HENT:  Head: Normocephalic and atraumatic.  Right Ear: External ear normal.  Left Ear: External ear normal.  Mouth/Throat: Oropharynx is clear and moist.  Nares are congested and boggy  Clear pnd  Mild maxillary sinus tenderness    Eyes: Conjunctivae and EOM are normal. Pupils are equal, round, and reactive to light.  Neck: Normal range of motion. Neck supple. No JVD present. Carotid bruit is not present. No thyromegaly present.  Cardiovascular: Regular rhythm, normal heart sounds and intact distal pulses.  Exam reveals no gallop.   No murmur heard. Pulse 92 at rest    Pulmonary/Chest: Effort normal and breath sounds normal. No respiratory distress. She has no wheezes. She has no rales. She exhibits no tenderness.  No crackles  Abdominal: Soft. Bowel sounds are normal. She exhibits no distension, no abdominal bruit and no mass. There is no tenderness. There is no rebound and no guarding.  Musculoskeletal: She exhibits no edema.  Lymphadenopathy:    She has no cervical adenopathy.  Neurological: She is alert. She has normal reflexes. No cranial nerve deficit. She exhibits normal muscle tone. Coordination normal.  Skin: Skin is warm and dry. No rash noted.  Psychiatric: She has a normal mood and affect.  Pt seems generally tired and stressed           Assessment & Plan:   Problem List Items Addressed This Visit      Cardiovascular and Mediastinum   Essential hypertension - Primary    bp in fair control at this time  BP Readings from Last 1 Encounters:  08/19/16 122/78   No changes needed Disc lifstyle change with low sodium diet and exercise        Relevant Medications   losartan-hydrochlorothiazide (HYZAAR) 100-25 MG tablet   Coronary atherosclerosis    Per pt hx of mild CAD on cath years ago  She is now exp sob on exertion with orthopnea  Also change of EKG to LBBB Ref to cardiology  Relevant Medications   losartan-hydrochlorothiazide (HYZAAR) 100-25 MG tablet     Other   Left bundle branch block (LBBB) on electrocardiogram    This is new Sob on exertion as well  Ref to cardiology for further eval       Relevant Orders   Ambulatory referral to Cardiology   Hyperlipidemia with  low HDL    Disc goals for lipids and reasons to control them Rev labs with pt Rev low sat fat diet in detail Labs in April- LDL under 100 and HDL low at 37.6      Relevant Medications   losartan-hydrochlorothiazide (HYZAAR) 100-25 MG tablet   Dyspnea on exertion    For 2-3 weeks  Some orthopnea  Change in EKG to LBBB  Remote hx of cath in the past with mild CAD ? (per pt)  Hyperlipidemia with atorvastatin  cxr today  Ref to cardiology  inst to go to ED if symptoms suddenly worsen       Anemia of chronic disease    This has been stable  To return to hematology if Hb under 10         Other Visit Diagnoses    Shortness of breath on exertion       Relevant Orders   EKG 12-Lead (Completed)   DG Chest 2 View (Completed)   Ambulatory referral to Cardiology

## 2016-08-19 NOTE — Patient Instructions (Addendum)
We will contact you with chest xray report tomorrow when it returns  We will also place an urgent cardiac referral for your symptoms and EKG change If you get chest pain or your symptoms suddenly worsen-please go to the ER

## 2016-08-20 ENCOUNTER — Telehealth: Payer: Self-pay

## 2016-08-20 MED ORDER — AZITHROMYCIN 250 MG PO TABS: ORAL_TABLET | ORAL | 0 refills | Status: DC

## 2016-08-20 NOTE — Telephone Encounter (Signed)
Pt left /vm; pt seen 08/19/16; pt wants to know if there is med can be sent to [facility] for continuing sinus problem. Pt request cb. Pt also has cardiologist appt on 08/25/16.

## 2016-08-20 NOTE — Assessment & Plan Note (Signed)
This has been stable  To return to hematology if Hb under 10

## 2016-08-20 NOTE — Assessment & Plan Note (Signed)
Disc goals for lipids and reasons to control them Rev labs with pt Rev low sat fat diet in detail Labs in April- LDL under 100 and HDL low at 37.6

## 2016-08-20 NOTE — Assessment & Plan Note (Signed)
bp in fair control at this time  BP Readings from Last 1 Encounters:  08/19/16 122/78   No changes needed Disc lifstyle change with low sodium diet and exercise

## 2016-08-20 NOTE — Assessment & Plan Note (Signed)
For 2-3 weeks  Some orthopnea  Change in EKG to LBBB  Remote hx of cath in the past with mild CAD ? (per pt)  Hyperlipidemia with atorvastatin  cxr today  Ref to cardiology  inst to go to ED if symptoms suddenly worsen

## 2016-08-20 NOTE — Assessment & Plan Note (Signed)
Per pt hx of mild CAD on cath years ago  She is now exp sob on exertion with orthopnea  Also change of EKG to LBBB Ref to cardiology

## 2016-08-20 NOTE — Telephone Encounter (Signed)
Pt notified Rx sent and advise her of Dr. Marliss Coots comments

## 2016-08-20 NOTE — Telephone Encounter (Signed)
We can treat for a sinus infection  Since she cannot take sulfa or augmentin I will send in zpak  Alert me if no improvement

## 2016-08-20 NOTE — Assessment & Plan Note (Signed)
This is new Sob on exertion as well  Ref to cardiology for further eval

## 2016-08-24 DIAGNOSIS — I2510 Atherosclerotic heart disease of native coronary artery without angina pectoris: Secondary | ICD-10-CM

## 2016-08-24 DIAGNOSIS — Z9861 Coronary angioplasty status: Secondary | ICD-10-CM

## 2016-08-24 HISTORY — DX: Atherosclerotic heart disease of native coronary artery without angina pectoris: Z98.61

## 2016-08-24 HISTORY — DX: Atherosclerotic heart disease of native coronary artery without angina pectoris: I25.10

## 2016-08-24 HISTORY — PX: TRANSTHORACIC ECHOCARDIOGRAM: SHX275

## 2016-08-25 ENCOUNTER — Encounter (INDEPENDENT_AMBULATORY_CARE_PROVIDER_SITE_OTHER): Payer: Self-pay

## 2016-08-25 ENCOUNTER — Encounter: Payer: Self-pay | Admitting: Cardiology

## 2016-08-25 ENCOUNTER — Telehealth (HOSPITAL_COMMUNITY): Payer: Self-pay | Admitting: *Deleted

## 2016-08-25 ENCOUNTER — Ambulatory Visit (INDEPENDENT_AMBULATORY_CARE_PROVIDER_SITE_OTHER): Payer: Medicare Other | Admitting: Cardiology

## 2016-08-25 VITALS — BP 120/66 | HR 92 | Ht 64.0 in | Wt 202.0 lb

## 2016-08-25 DIAGNOSIS — I2510 Atherosclerotic heart disease of native coronary artery without angina pectoris: Secondary | ICD-10-CM | POA: Diagnosis not present

## 2016-08-25 DIAGNOSIS — D649 Anemia, unspecified: Secondary | ICD-10-CM | POA: Diagnosis not present

## 2016-08-25 DIAGNOSIS — R0609 Other forms of dyspnea: Secondary | ICD-10-CM | POA: Diagnosis not present

## 2016-08-25 NOTE — Patient Instructions (Signed)
Medication Instructions:  Your physician recommends that you continue on your current medications as directed. Please refer to the Current Medication list given to you today.   Labwork: TODAY BMET, BNP, CBC, TSH  Testing/Procedures: 1. Your physician has requested that you have a lexiscan myoview. For further information please visit [email or web address]. Please follow instruction sheet, as given.  2. Your physician has requested that you have an echocardiogram. Echocardiography is a painless test that uses sound waves to create images of your heart. It provides your doctor with information about the size and shape of your heart and how well your heart's chambers and valves are working. This procedure takes approximately one hour. There are no restrictions for this procedure.    Follow-Up: 2 WEEK WITH BRITTANY SIMMONS, PAC   Any Other Special Instructions Will Be Listed Below (If Applicable).     If you need a refill on your cardiac medications before your next appointment, please call your pharmacy.

## 2016-08-25 NOTE — Telephone Encounter (Signed)
Pt called to stated that she is still not feeling well - she is calling to let you know per your instruction.  She said she is still coughing and has drainage.  She also reported that she is scheduled for a stress test and echocardiogram scheduled for Thursday per her cardiology visit today.  Thanks.

## 2016-08-25 NOTE — Telephone Encounter (Signed)
Left message on voicemail per DPR in reference to upcoming appointment scheduled on 08/27/16 at 0730 with detailed instructions given per Myocardial Perfusion Study Information Sheet for the test. LM to arrive 15 minutes early, and that it is imperative to arrive on time for appointment to keep from having the test rescheduled. If you need to cancel or reschedule your appointment, please call the office within 24 hours of your appointment. Failure to do so may result in a cancellation of your appointment, and a $50 no show fee. Phone number given for call back for any questions.

## 2016-08-25 NOTE — Telephone Encounter (Signed)
Is her phlegm/mucous clear or green? Any wheezing or facial pain or fever?  Thanks

## 2016-08-25 NOTE — Progress Notes (Signed)
08/25/2016 [name] [name]   03-03-1941  [id number]  Primary Physician [name], MD Primary Cardiologist: New    Reason for Visit/CC: New Patient Evaluation for Exertional Dyspnea, Fatigue and new LBBB Referring MD:  Dr. [name], MD   HPI:  [name] [name] is a 76 y/o female, who presents to clinic as a new patient for evaluation given recent symptoms of exertional dyspnea and fatigue. She was referred by her PCP, Dr. Glori Bickers, for evaluation. She was seen by PCP on 12/28 and noted recent symptoms. She was also found to have a new LBBB on EKG. EKG today shows NSR with nonspecific intraventricular block.   Per records, she has a h/o mild nonobstructive CAD that was noted on cath back in 2001. There was 30% LM stenosis, 30% ostial LAD and 20% proximal RCA stenosis. LVEF was normal at that time at 60%. She has not been followed by a cardiologist since that time. Of note, she also has a h/o HLD but not currently on a statin. No h/o DM nor tobacco use. No family h/o of CAD.   Symptoms have been ongoing for the last 3-4 months. She is asymptomatic at rest. Only short of breath with activity. She notes symptoms of orthopnea for the last 2-3 weeks. She has been sleeping in her recliner. No LEE or weight gain. She has a dry cough. No chest pain, palpitations, dizziness, syncope, near syncope. BP is well controlled at 120/66.     Current Meds  Medication Sig  . amoxicillin (AMOXIL) 500 MG capsule Take 2,000 mg by mouth as needed.   Marland Kitchen atorvastatin (LIPITOR) 10 MG tablet Take 1 tablet (10 mg total) by mouth daily. *Please schedule a follow up appointment with Dr Glori Bickers for refills.  . busPIRone (BUSPAR) 15 MG tablet TAKE 1/2 TO 1 TABLET BY MOUTH TWICE A DAY  . clotrimazole-betamethasone (LOTRISONE) cream APPLY TO AFFECTED AREA TWICE A DAY UNTIL CLEARED  . esomeprazole (NEXIUM) 40 MG capsule Take 1 capsule (40 mg total) by mouth daily.  Marland Kitchen FOLIC ACID PO Take 1 tablet by mouth daily.  . hyoscyamine  (LEVSIN, ANASPAZ) 0.125 MG tablet TAKE 1 TABLET BY MOUTH AS NEEDED.  Marland Kitchen losartan-hydrochlorothiazide (HYZAAR) 100-25 MG tablet Take 1 tablet by mouth daily.  . Melatonin 3 MG CAPS Take 6 mg by mouth at bedtime.  . methotrexate (50 MG/ML) 1 g injection Inject 0.4 mg into the vein once a week.  Marland Kitchen oxymetazoline (AFRIN) 0.05 % nasal spray Place 2 sprays into the nose 2 (two) times daily as needed for congestion.  . predniSONE (DELTASONE) 10 MG tablet Take 10 mg by mouth daily with breakfast.  . raloxifene (EVISTA) 60 MG tablet Take 1 tablet (60 mg total) by mouth daily.  . [DISCONTINUED] predniSONE (DELTASONE) 10 MG tablet Take 2 tabs by mouth once daily for 2 weeks and then decrease to 1 tablet daily (Patient taking differently: Take 10 mg by mouth daily with breakfast. )   Allergies  Allergen Reactions  . Ace Inhibitors     REACTION: cough  . Amoxicillin-Pot Clavulanate     REACTION: gi upset  . Oxaprozin     REACTION: unknown  . Pregabalin     REACTION: swelling  . Sulfonamide Derivatives     REACTION: UNKNOWN   Past Medical History:  Diagnosis Date  . Anemia    of chronic disease  . Anxiety   . Arthritis    osteoarthritis  . Complication of anesthesia 1980 and 1982  trouble waking up after breast biopsy  . Depression   . Fibrocystic breast   . Hx of skin cancer, basal cell 2013 and 2012  . Hypertension   . Osteopenia   . Peptic ulcer disease    H pylori  . Psoriasis    Family History  Problem Relation Age of Onset  . Arthritis Mother   . Emphysema Mother   . Hyperlipidemia Mother   . Hypertension Mother   . Heart disease Brother     CAD   Past Surgical History:  Procedure Laterality Date  . ABDOMINAL HYSTERECTOMY     partial in 1970s  . BREAST BIOPSY Left 1980 and 1982  . BREAST SURGERY    . CATARACT EXTRACTION, BILATERAL  July and August2017  . CHOLECYSTECTOMY  2005  . ESOPHAGOGASTRODUODENOSCOPY  03/2009   ulcer,HP, GERD stricture  . HIP ARTHROPLASTY Left  2010  . JOINT REPLACEMENT Right 2009   knee  . KNEE SURGERY     x3  . NASAL SINUS SURGERY    . SPINE SURGERY     back surgery x 3  . SPINE SURGERY  2005   deg. disk lumber spine   . TOTAL HIP ARTHROPLASTY Right 04/04/2013   Procedure: RIGHT TOTAL HIP ARTHROPLASTY ANTERIOR APPROACH;  Surgeon: Mauri Pole, MD;  Location: [facility];  Service: Orthopedics;  Laterality: Right;   Social History   Social History  . Marital status: Married    Spouse name: N/A  . Number of children: N/A  . Years of education: N/A   Occupational History  . Not on file.   Social History Main Topics  . Smoking status: Never Smoker  . Smokeless tobacco: Never Used  . Alcohol use 0.0 oz/week     Comment: occasional  . Drug use: No  . Sexual activity: Not Currently   Other Topics Concern  . Not on file   Social History Narrative  . No narrative on file     Review of Systems: General: negative for chills, fever, night sweats or weight changes.  Cardiovascular: negative for chest pain, dyspnea on exertion, edema, orthopnea, palpitations, paroxysmal nocturnal dyspnea or shortness of breath Dermatological: negative for rash Respiratory: negative for cough or wheezing Urologic: negative for hematuria Abdominal: negative for nausea, vomiting, diarrhea, bright red blood per rectum, melena, or hematemesis Neurologic: negative for visual changes, syncope, or dizziness All other systems reviewed and are otherwise negative except as noted above.   Physical Exam:  Blood pressure 120/66, pulse 92, height 5\' 4"  (1.626 m), weight 202 lb (91.6 kg).  General appearance: alert, cooperative and no distress Neck: no carotid bruit and no JVD Lungs: clear to auscultation bilaterally Heart: regular rate and rhythm, S1, S2 normal, no murmur, click, rub or gallop Extremities: extremities normal, atraumatic, no cyanosis or edema Pulses: 2+ and symmetric Skin: Skin color, texture, turgor normal. No rashes or  lesions Neurologic: Grossly normal  EKG NSR with Intraventricular block. 95 bpm   ASSESSMENT AND PLAN:   1. Exertional Dyspnea + Fatigue w/ Abnormal EKG: we will obtain a NST to r/o ischemia plus a 2D echo to assess LVF, wall motion, valve anatomy and the pericardium. We will also obtain a CBC to r/o anemia, as well as a BNP and TSH. F/u in 1-2 weeks.   The patient has been seen and examined by Dr. Curt Bears, DOD, who also agrees with the above assessment and plan.   [name] Theroux PA-C 08/25/2016 11:59 AM

## 2016-08-25 NOTE — Telephone Encounter (Signed)
Clear phlegm, no wheezing, fever or facial pain.  Patient says everything is just as it was, drainage and cough.

## 2016-08-26 ENCOUNTER — Telehealth: Payer: Self-pay | Admitting: Cardiology

## 2016-08-26 LAB — BASIC METABOLIC PANEL
BUN/Creatinine Ratio: 21 (ref 12–28)
BUN: 23 mg/dL (ref 8–27)
CALCIUM: 9 mg/dL (ref 8.7–10.3)
CHLORIDE: 105 mmol/L (ref 96–106)
CO2: 21 mmol/L (ref 18–29)
Creatinine, Ser: 1.12 mg/dL — ABNORMAL HIGH (ref 0.57–1.00)
GFR calc non Af Amer: 47 mL/min/{1.73_m2} — ABNORMAL LOW (ref >59)
GFR, EST AFRICAN AMERICAN: 55 mL/min/{1.73_m2} — AB (ref >59)
GLUCOSE: 120 mg/dL — AB (ref 65–99)
Potassium: 3.9 mmol/L (ref 3.5–5.2)
Sodium: 145 mmol/L — ABNORMAL HIGH (ref 134–144)

## 2016-08-26 LAB — CBC
Hematocrit: 30.1 % — ABNORMAL LOW (ref 34.0–46.6)
Hemoglobin: 9.7 g/dL — ABNORMAL LOW (ref 11.1–15.9)
MCH: 29.4 pg (ref 26.6–33.0)
MCHC: 32.2 g/dL (ref 31.5–35.7)
MCV: 91 fL (ref 79–97)
PLATELETS: 395 10*3/uL — AB (ref 150–379)
RBC: 3.3 x10E6/uL — AB (ref 3.77–5.28)
RDW: 16.2 % — AB (ref 12.3–15.4)
WBC: 9.6 10*3/uL (ref 3.4–10.8)

## 2016-08-26 LAB — BRAIN NATRIURETIC PEPTIDE

## 2016-08-26 LAB — TSH: TSH: 0.991 u[IU]/mL (ref 0.450–4.500)

## 2016-08-26 MED ORDER — BENZONATATE 200 MG PO CAPS: 200.0000 mg | ORAL_CAPSULE | Freq: Three times a day (TID) | ORAL | 1 refills | Status: DC | PRN

## 2016-08-26 NOTE — Telephone Encounter (Signed)
Please try otc zyrtec or allegra or claritin for the pnd  Also nasal saline irrigation in the am (I like simply saline)  For cough -please send in (if she wants it)- tessalon 200 mg 1 po tid prn cough (#30 1 ref) - remind her to swallow whole and do not bite pill. Alert me if mucous turns green/yellow/fever develops / or worse wheeze/cough or other symptoms   I rev her cardiology note and plan-thanks

## 2016-08-26 NOTE — Telephone Encounter (Signed)
Reviewed with [employer] (Publishing copy) and Lanny Hurst from our lab will call [place].

## 2016-08-26 NOTE — Addendum Note (Signed)
Addended by: Tammi Sou on: 08/26/2016 09:16 AM   Modules accepted: Orders

## 2016-08-26 NOTE — Telephone Encounter (Signed)
Pt notified of Dr. Marliss Coots comments/ instructions and verbalized understanding. Rx sent to pharmacy and pt advise to keep [name] updated if sxs worsen

## 2016-08-26 NOTE — Telephone Encounter (Signed)
New Message  Joey voiced he is calling in regards to pt labs.  Please f/u

## 2016-08-27 ENCOUNTER — Ambulatory Visit (HOSPITAL_COMMUNITY): Payer: Medicare Other | Attending: Cardiovascular Disease

## 2016-08-27 ENCOUNTER — Ambulatory Visit (HOSPITAL_BASED_OUTPATIENT_CLINIC_OR_DEPARTMENT_OTHER): Payer: Medicare Other

## 2016-08-27 ENCOUNTER — Other Ambulatory Visit: Payer: Self-pay

## 2016-08-27 DIAGNOSIS — I351 Nonrheumatic aortic (valve) insufficiency: Secondary | ICD-10-CM | POA: Diagnosis not present

## 2016-08-27 DIAGNOSIS — I2510 Atherosclerotic heart disease of native coronary artery without angina pectoris: Secondary | ICD-10-CM | POA: Diagnosis not present

## 2016-08-27 DIAGNOSIS — R0609 Other forms of dyspnea: Secondary | ICD-10-CM

## 2016-08-27 DIAGNOSIS — R5383 Other fatigue: Secondary | ICD-10-CM | POA: Diagnosis not present

## 2016-08-27 DIAGNOSIS — R9439 Abnormal result of other cardiovascular function study: Secondary | ICD-10-CM | POA: Insufficient documentation

## 2016-08-27 DIAGNOSIS — I10 Essential (primary) hypertension: Secondary | ICD-10-CM | POA: Diagnosis not present

## 2016-08-27 DIAGNOSIS — I447 Left bundle-branch block, unspecified: Secondary | ICD-10-CM | POA: Diagnosis not present

## 2016-08-27 LAB — ECHOCARDIOGRAM COMPLETE
HEIGHTINCHES: 64 in
WEIGHTICAEL: 3232 [oz_av]

## 2016-08-27 MED ORDER — REGADENOSON 0.4 MG/5ML IV SOLN
0.4000 mg | Freq: Once | INTRAVENOUS | Status: AC
  Administered 2016-08-27: 0.4 mg via INTRAVENOUS

## 2016-08-27 MED ORDER — TECHNETIUM TC 99M TETROFOSMIN IV KIT
32.5000 | PACK | Freq: Once | INTRAVENOUS | Status: AC | PRN
  Administered 2016-08-27: 32.5 via INTRAVENOUS
  Filled 2016-08-27: qty 33

## 2016-08-27 NOTE — Telephone Encounter (Signed)
BNP resulted.

## 2016-08-28 ENCOUNTER — Telehealth: Payer: Self-pay | Admitting: Emergency Medicine

## 2016-08-28 ENCOUNTER — Ambulatory Visit (HOSPITAL_COMMUNITY): Payer: Medicare Other | Attending: Cardiovascular Disease

## 2016-08-28 HISTORY — PX: NM MYOVIEW LTD: HXRAD82

## 2016-08-28 LAB — MYOCARDIAL PERFUSION IMAGING
CHL CUP NUCLEAR SDS: 6
CHL CUP NUCLEAR SRS: 11
CHL CUP NUCLEAR SSS: 16
CSEPPHR: 108 {beats}/min
LV dias vol: 93 mL (ref 46–106)
LVSYSVOL: 56 mL
RATE: 0.57
Rest HR: 86 {beats}/min
TID: 1.02

## 2016-08-28 MED ORDER — TECHNETIUM TC 99M TETROFOSMIN IV KIT
32.4000 | PACK | Freq: Once | INTRAVENOUS | Status: AC | PRN
  Administered 2016-08-28: 32.4 via INTRAVENOUS
  Filled 2016-08-28: qty 33

## 2016-08-28 NOTE — Telephone Encounter (Signed)
Patient called to cancel upcoming appointments for lab and Dr Lindi Adie due to current cardiac work up.  Patient instructed to call for any concerns in the meantime and will call to reschedule once cardiac workup complete and she is ready to follow up.Patient agreed with plan and verbalized understanding.

## 2016-08-31 ENCOUNTER — Telehealth: Payer: Self-pay | Admitting: Cardiology

## 2016-08-31 NOTE — Telephone Encounter (Signed)
Returned pts call and advised her that she had her last test on Friday and as soon as [place] got the results, we would call her with them.  She verbalized understanding and appreciation.

## 2016-08-31 NOTE — Telephone Encounter (Signed)
New message  Pt verbalized that she is calling for results 08/27/16 and 08/28/16

## 2016-09-01 ENCOUNTER — Other Ambulatory Visit: Payer: Medicare Other

## 2016-09-01 ENCOUNTER — Ambulatory Visit: Payer: Medicare Other | Admitting: Hematology and Oncology

## 2016-09-08 ENCOUNTER — Encounter: Payer: Self-pay | Admitting: *Deleted

## 2016-09-08 ENCOUNTER — Ambulatory Visit (INDEPENDENT_AMBULATORY_CARE_PROVIDER_SITE_OTHER): Payer: Medicare Other | Admitting: Cardiology

## 2016-09-08 ENCOUNTER — Encounter: Payer: Self-pay | Admitting: Cardiology

## 2016-09-08 VITALS — BP 144/76 | HR 100 | Ht 64.0 in | Wt 201.8 lb

## 2016-09-08 DIAGNOSIS — R9439 Abnormal result of other cardiovascular function study: Secondary | ICD-10-CM

## 2016-09-08 MED ORDER — NITROGLYCERIN 0.4 MG SL SUBL: 0.4000 mg | SUBLINGUAL_TABLET | SUBLINGUAL | 5 refills | Status: DC | PRN

## 2016-09-08 MED ORDER — METOPROLOL TARTRATE 25 MG PO TABS: 12.5000 mg | ORAL_TABLET | Freq: Two times a day (BID) | ORAL | 5 refills | Status: DC

## 2016-09-08 NOTE — Patient Instructions (Addendum)
Medication Instructions:   START TAKING METOPROLOL 12.5  TWICE A DAY   START TAKING NITROGLYCERIN SUBLINGUAL   0.4 MG AS NEEDED FOR CHEST PAIN  If you need a refill on your cardiac medications before your next appointment, please call your pharmacy.  Labwork:  CBC AND BMET TODAY    Testing/Procedures: SEE  LETTER  FOR CATHERIZATION     Follow-Up: WILL BE DETERMINED AFTER CATHERIZATION     Any Other Special Instructions Will Be Listed Below (If Applicable).

## 2016-09-08 NOTE — Progress Notes (Signed)
09/08/2016 ITA FRITZSCHE   September 04, 1940  [id number]  Primary Physician [name], MD Primary Cardiologist: New  Reason for Visit/CC: Exertional Dyspnea, Abnormal EKG, Stress Test and 2D echo  HPI:  Mrs. Venturino presents back to clinic, with her daughter, for f/u and to review recent test results. I first evaluated her on 08/25/16 as a new patient. Dr. Curt Bears was also present as DOD. He also examined her and recommended ischemic w/u.  To summarize, she is a 76 y/o female who was referred by her PCP, Dr. Glori Bickers, for evaluation. She was seen by PCP on 08/20/16 and noted recent symptoms of exertional dyspnea. She was also found to have a new LBBB on EKG.  Per records, she has a h/o mild nonobstructive CAD that was noted on cath back in 2001. There was 30% LM stenosis, 30% ostial LAD and 20% proximal RCA stenosis. LVEF was normal at that time at 60%. She had not been followed by a cardiologist since that time. Of note, she also has a h/o HLD but not currently on a statin. No h/o DM nor tobacco use. No family h/o of CAD.   Symptoms have been ongoing for the last 3-4 months. She is asymptomatic at rest. Only short of breath with activity. She notes symptoms of orthopnea for the last 2-3 weeks. She has been sleeping in her recliner. No LEE or weight gain. She has a dry cough. No chest pain, palpitations, dizziness, syncope, near syncope.  At her new patient evaluation, we ordered a pro-BNP which was WNL. 2D echo showed reduced LVEF at 45-50% (previously 60%). There is also hypokinesis of the anteroseptal and inferoseptal myocardium, grade 1 DD and moderate aortic insufficiency. Her stress test is intermediate risk, with EF estimate of 30-44%, with a small inferior apical and septal wall infarct w/o ischemia. Findings suggest prior MI. Infarct territory correlates to area of WMA noted on echo.  She continues to note exertional dyspnea. She gets short of breath walking short distances. She denies resting  dyspnea. She also denies CP. In clinic today, BP is 144/76. Pulse rate is 100. She is not on a BB.   Of note, she also has a h/o chronic anemia and has been followed by hematology. Hgb has been stable, over the past 2 years, in the 9-10 range. She denies melena.    Current Meds  Medication Sig  . amoxicillin (AMOXIL) 500 MG capsule Take 2,000 mg by mouth as needed.   Marland Kitchen atorvastatin (LIPITOR) 10 MG tablet Take 1 tablet (10 mg total) by mouth daily. *Please schedule a follow up appointment with Dr Glori Bickers for refills. (Patient not taking: Reported on 09/08/2016)  . benzonatate (TESSALON) 200 MG capsule Take 1 capsule (200 mg total) by mouth 3 (three) times daily as needed for cough. (Patient not taking: Reported on 09/08/2016)  . busPIRone (BUSPAR) 15 MG tablet TAKE 1/2 TO 1 TABLET BY MOUTH TWICE A DAY (Patient taking differently: Take 15mg s in the morning and 7.5mg s at night)  . clotrimazole-betamethasone (LOTRISONE) cream APPLY TO AFFECTED AREA TWICE A DAY UNTIL CLEARED (Patient taking differently: APPLY TO AFFECTED AREA TWICE A DAY AS NEEDED IRRITATION)  . esomeprazole (NEXIUM) 40 MG capsule Take 1 capsule (40 mg total) by mouth daily.  . hyoscyamine (LEVSIN, ANASPAZ) 0.125 MG tablet TAKE 1 TABLET BY MOUTH AS NEEDED. (Patient taking differently: TAKE 1 TABLET BY MOUTH AS NEEDED STOMACH SPASMS)  . losartan-hydrochlorothiazide (HYZAAR) 100-25 MG tablet Take 1 tablet by mouth daily.  Marland Kitchen  Melatonin 3 MG CAPS Take 6 mg by mouth at bedtime.  . methotrexate (50 MG/ML) 1 g injection Inject 0.4 mg into the vein once a week.  Marland Kitchen oxymetazoline (AFRIN) 0.05 % nasal spray Place 2 sprays into the nose 2 (two) times daily as needed for congestion.  . predniSONE (DELTASONE) 10 MG tablet Take 10 mg by mouth daily with breakfast.  . raloxifene (EVISTA) 60 MG tablet Take 1 tablet (60 mg total) by mouth daily.  . [DISCONTINUED] FOLIC ACID PO Take 1 tablet by mouth daily.   Allergies  Allergen Reactions  . Ace  Inhibitors     REACTION: cough  . Amoxicillin-Pot Clavulanate     REACTION: gi upset Has patient had a PCN reaction causing immediate rash, facial/tongue/throat swelling, SOB or lightheadedness with hypotension: No Has patient had a PCN reaction causing severe rash involving mucus membranes or skin necrosis: No Has patient had a PCN reaction that required hospitalization No Has patient had a PCN reaction occurring within the last 10 years: No If all of the above answers are "NO", then may proceed with Cephalosporin use.   Marland Kitchen Oxaprozin     REACTION: unknown  . Pregabalin     REACTION: swelling  . Sulfonamide Derivatives     REACTION: UNKNOWN   Past Medical History:  Diagnosis Date  . Anemia    of chronic disease  . Anxiety   . Arthritis    osteoarthritis  . Complication of anesthesia 1980 and 1982   trouble waking up after breast biopsy  . Depression   . Fibrocystic breast   . Hx of skin cancer, basal cell 2013 and 2012  . Hypertension   . Osteopenia   . Peptic ulcer disease    H pylori  . Psoriasis    Family History  Problem Relation Age of Onset  . Arthritis Mother   . Emphysema Mother   . Hyperlipidemia Mother   . Hypertension Mother   . Heart disease Brother     CAD   Past Surgical History:  Procedure Laterality Date  . ABDOMINAL HYSTERECTOMY     partial in 1970s  . BREAST BIOPSY Left 1980 and 1982  . BREAST SURGERY    . CATARACT EXTRACTION, BILATERAL  July and August2017  . CHOLECYSTECTOMY  2005  . ESOPHAGOGASTRODUODENOSCOPY  03/2009   ulcer,HP, GERD stricture  . HIP ARTHROPLASTY Left 2010  . JOINT REPLACEMENT Right 2009   knee  . KNEE SURGERY     x3  . NASAL SINUS SURGERY    . SPINE SURGERY     back surgery x 3  . SPINE SURGERY  2005   deg. disk lumber spine   . TOTAL HIP ARTHROPLASTY Right 04/04/2013   Procedure: RIGHT TOTAL HIP ARTHROPLASTY ANTERIOR APPROACH;  Surgeon: Mauri Pole, MD;  Location: [facility];  Service: Orthopedics;  Laterality:  Right;   Social History   Social History  . Marital status: Married    Spouse name: N/A  . Number of children: N/A  . Years of education: N/A   Occupational History  . Not on file.   Social History Main Topics  . Smoking status: Never Smoker  . Smokeless tobacco: Never Used  . Alcohol use 0.0 oz/week     Comment: occasional  . Drug use: No  . Sexual activity: Not Currently   Other Topics Concern  . Not on file   Social History Narrative  . No narrative on file     Review  of Systems: General: negative for chills, fever, night sweats or weight changes.  Cardiovascular: negative for chest pain, dyspnea on exertion, edema, orthopnea, palpitations, paroxysmal nocturnal dyspnea or shortness of breath Dermatological: negative for rash Respiratory: negative for cough or wheezing Urologic: negative for hematuria Abdominal: negative for nausea, vomiting, diarrhea, bright red blood per rectum, melena, or hematemesis Neurologic: negative for visual changes, syncope, or dizziness All other systems reviewed and are otherwise negative except as noted above.   Physical Exam:  Blood pressure (!) 144/76, pulse 100, height 5\' 4"  (1.626 m), weight 201 lb 12.8 oz (91.5 kg).  General appearance: alert, cooperative and no distress Neck: no carotid bruit and no JVD Lungs: clear to auscultation bilaterally Heart: regular rate and rhythm, S1, S2 normal, no murmur, click, rub or gallop Extremities: extremities normal, atraumatic, no cyanosis or edema Pulses: 2+ and symmetric Skin: Skin color, texture, turgor normal. No rashes or lesions Neurologic: Grossly normal  EKG not performed   ASSESSMENT AND PLAN:   1. Exertional Dyspnea, Abnormal EKG, Stress Test and 2D echo: as outlined above, in HPI, suspect patient has underlying obstructive CAD explaining her recent symptoms, reduced EF, abnormal EKG, stress test and 2D echo with regional WMAs. She denies resting symptoms but notes exertional  dyspnea. Previous LHC in 2001 showed CAD but it was nonobstructive at that time. Recommend repeat LHC this [facility] at [facility]. I discussed indication for cath as well as procedural details and possible risk, including risk for death, MI, stroke, vascular injury, bleeding, nephrotoxicity and allergic reaction. She understands these risk and agrees to proceed with definitive testing. Her BP is mildly elevated and her pulse rate is 100. We will start her on a low dose BB, metoprolol 12.5 mg BID (I feel that 25 mg BID is more reasonable, but patient wishes to avoid starting on higher dose as she had issues with fatigue with BBs in the past). Will also give Rx for PRN SLNTG. We discussed proper use of SLNTG. Patient advised to go to ER if development of unstable symptoms, unrelieved with NTG. She and her daughter verbalized understanding.     Lyda Jester PA-C 09/08/2016 1:03 PM

## 2016-09-09 LAB — BASIC METABOLIC PANEL
BUN/Creatinine Ratio: 21 (ref 12–28)
BUN: 21 mg/dL (ref 8–27)
CALCIUM: 9 mg/dL (ref 8.7–10.3)
CO2: 22 mmol/L (ref 18–29)
Chloride: 100 mmol/L (ref 96–106)
Creatinine, Ser: 0.98 mg/dL (ref 0.57–1.00)
GFR calc Af Amer: 64 mL/min/{1.73_m2} (ref >59)
GFR, EST NON AFRICAN AMERICAN: 56 mL/min/{1.73_m2} — AB (ref >59)
Glucose: 100 mg/dL — ABNORMAL HIGH (ref 65–99)
POTASSIUM: 3.8 mmol/L (ref 3.5–5.2)
SODIUM: 142 mmol/L (ref 134–144)

## 2016-09-09 LAB — CBC
HEMATOCRIT: 32.3 % — AB (ref 34.0–46.6)
HEMOGLOBIN: 10.6 g/dL — AB (ref 11.1–15.9)
MCH: 30.6 pg (ref 26.6–33.0)
MCHC: 32.8 g/dL (ref 31.5–35.7)
MCV: 93 fL (ref 79–97)
Platelets: 331 10*3/uL (ref 150–379)
RBC: 3.46 x10E6/uL — ABNORMAL LOW (ref 3.77–5.28)
RDW: 15.6 % — AB (ref 12.3–15.4)
WBC: 13.2 10*3/uL — AB (ref 3.4–10.8)

## 2016-09-11 ENCOUNTER — Ambulatory Visit (HOSPITAL_COMMUNITY)
Admission: RE | Admit: 2016-09-11 | Discharge: 2016-09-12 | Disposition: A | Discharge status: Home / Self Care | Payer: Medicare Other | Source: Ambulatory Visit | Attending: Cardiology | Admitting: Cardiology

## 2016-09-11 ENCOUNTER — Encounter (HOSPITAL_COMMUNITY)
Admission: RE | Disposition: A | Discharge status: Home / Self Care | Payer: Self-pay | Source: Ambulatory Visit | Attending: Cardiology

## 2016-09-11 ENCOUNTER — Encounter (HOSPITAL_COMMUNITY): Payer: Self-pay | Admitting: Cardiology

## 2016-09-11 DIAGNOSIS — M8580 Other specified disorders of bone density and structure, unspecified site: Secondary | ICD-10-CM | POA: Insufficient documentation

## 2016-09-11 DIAGNOSIS — I252 Old myocardial infarction: Secondary | ICD-10-CM | POA: Diagnosis not present

## 2016-09-11 DIAGNOSIS — F329 Major depressive disorder, single episode, unspecified: Secondary | ICD-10-CM | POA: Insufficient documentation

## 2016-09-11 DIAGNOSIS — Z8711 Personal history of peptic ulcer disease: Secondary | ICD-10-CM | POA: Insufficient documentation

## 2016-09-11 DIAGNOSIS — E785 Hyperlipidemia, unspecified: Secondary | ICD-10-CM | POA: Diagnosis present

## 2016-09-11 DIAGNOSIS — Z23 Encounter for immunization: Secondary | ICD-10-CM | POA: Diagnosis not present

## 2016-09-11 DIAGNOSIS — I447 Left bundle-branch block, unspecified: Secondary | ICD-10-CM | POA: Diagnosis not present

## 2016-09-11 DIAGNOSIS — I351 Nonrheumatic aortic (valve) insufficiency: Secondary | ICD-10-CM | POA: Diagnosis not present

## 2016-09-11 DIAGNOSIS — Z85828 Personal history of other malignant neoplasm of skin: Secondary | ICD-10-CM | POA: Diagnosis not present

## 2016-09-11 DIAGNOSIS — I110 Hypertensive heart disease with heart failure: Secondary | ICD-10-CM | POA: Insufficient documentation

## 2016-09-11 DIAGNOSIS — K219 Gastro-esophageal reflux disease without esophagitis: Secondary | ICD-10-CM | POA: Diagnosis not present

## 2016-09-11 DIAGNOSIS — F419 Anxiety disorder, unspecified: Secondary | ICD-10-CM | POA: Diagnosis not present

## 2016-09-11 DIAGNOSIS — Z880 Allergy status to penicillin: Secondary | ICD-10-CM | POA: Insufficient documentation

## 2016-09-11 DIAGNOSIS — Z882 Allergy status to sulfonamides status: Secondary | ICD-10-CM | POA: Diagnosis not present

## 2016-09-11 DIAGNOSIS — I5042 Chronic combined systolic (congestive) and diastolic (congestive) heart failure: Secondary | ICD-10-CM | POA: Diagnosis not present

## 2016-09-11 DIAGNOSIS — Z96651 Presence of right artificial knee joint: Secondary | ICD-10-CM | POA: Diagnosis not present

## 2016-09-11 DIAGNOSIS — I2510 Atherosclerotic heart disease of native coronary artery without angina pectoris: Secondary | ICD-10-CM | POA: Diagnosis not present

## 2016-09-11 DIAGNOSIS — I209 Angina pectoris, unspecified: Secondary | ICD-10-CM | POA: Diagnosis present

## 2016-09-11 DIAGNOSIS — Z8719 Personal history of other diseases of the digestive system: Secondary | ICD-10-CM | POA: Diagnosis not present

## 2016-09-11 DIAGNOSIS — I10 Essential (primary) hypertension: Secondary | ICD-10-CM | POA: Diagnosis present

## 2016-09-11 DIAGNOSIS — Z8249 Family history of ischemic heart disease and other diseases of the circulatory system: Secondary | ICD-10-CM | POA: Diagnosis not present

## 2016-09-11 DIAGNOSIS — G8929 Other chronic pain: Secondary | ICD-10-CM | POA: Diagnosis not present

## 2016-09-11 DIAGNOSIS — Z96641 Presence of right artificial hip joint: Secondary | ICD-10-CM | POA: Diagnosis not present

## 2016-09-11 DIAGNOSIS — R9439 Abnormal result of other cardiovascular function study: Secondary | ICD-10-CM | POA: Diagnosis present

## 2016-09-11 DIAGNOSIS — M1990 Unspecified osteoarthritis, unspecified site: Secondary | ICD-10-CM | POA: Insufficient documentation

## 2016-09-11 DIAGNOSIS — D649 Anemia, unspecified: Secondary | ICD-10-CM | POA: Diagnosis not present

## 2016-09-11 DIAGNOSIS — R0609 Other forms of dyspnea: Secondary | ICD-10-CM | POA: Diagnosis not present

## 2016-09-11 DIAGNOSIS — E786 Lipoprotein deficiency: Secondary | ICD-10-CM

## 2016-09-11 DIAGNOSIS — Z955 Presence of coronary angioplasty implant and graft: Secondary | ICD-10-CM

## 2016-09-11 HISTORY — PX: CARDIAC CATHETERIZATION: SHX172

## 2016-09-11 HISTORY — DX: Low back pain, unspecified: M54.50

## 2016-09-11 HISTORY — DX: Low back pain: M54.5

## 2016-09-11 HISTORY — DX: Unspecified osteoarthritis, unspecified site: M19.90

## 2016-09-11 HISTORY — DX: Other chronic pain: G89.29

## 2016-09-11 HISTORY — DX: Headache, unspecified: R51.9

## 2016-09-11 HISTORY — DX: Personal history of other medical treatment: Z92.89

## 2016-09-11 HISTORY — DX: Iron deficiency anemia, unspecified: D50.9

## 2016-09-11 HISTORY — DX: Polymyalgia rheumatica: M35.3

## 2016-09-11 HISTORY — DX: Headache: R51

## 2016-09-11 LAB — PROTIME-INR
INR: 0.98
PROTHROMBIN TIME: 13 s (ref 11.4–15.2)

## 2016-09-11 LAB — POCT ACTIVATED CLOTTING TIME
Activated Clotting Time: 241 s
Activated Clotting Time: 367 seconds

## 2016-09-11 SURGERY — LEFT HEART CATH AND CORONARY ANGIOGRAPHY

## 2016-09-11 MED ORDER — SODIUM CHLORIDE 0.9% FLUSH
3.0000 mL | Freq: Two times a day (BID) | INTRAVENOUS | Status: DC
  Administered 2016-09-12: 11:00:00 3 mL via INTRAVENOUS

## 2016-09-11 MED ORDER — ADENOSINE 12 MG/4ML IV SOLN
INTRAVENOUS | Status: AC
  Filled 2016-09-11: qty 16

## 2016-09-11 MED ORDER — HYDROCODONE-ACETAMINOPHEN 5-325 MG PO TABS
1.0000 | ORAL_TABLET | Freq: Two times a day (BID) | ORAL | Status: DC | PRN
  Administered 2016-09-11 – 2016-09-12 (×3): 1 via ORAL
  Filled 2016-09-11 (×3): qty 1

## 2016-09-11 MED ORDER — HYDROCHLOROTHIAZIDE 25 MG PO TABS
25.0000 mg | ORAL_TABLET | Freq: Every day | ORAL | Status: DC
  Administered 2016-09-12: 25 mg via ORAL
  Filled 2016-09-11: qty 1

## 2016-09-11 MED ORDER — METOPROLOL TARTRATE 12.5 MG HALF TABLET
12.5000 mg | ORAL_TABLET | Freq: Two times a day (BID) | ORAL | Status: DC
  Administered 2016-09-11 – 2016-09-12 (×2): 12.5 mg via ORAL
  Filled 2016-09-11 (×2): qty 1

## 2016-09-11 MED ORDER — ANGIOPLASTY BOOK
Freq: Once | Status: AC
  Administered 2016-09-12: 06:00:00
  Filled 2016-09-11: qty 1

## 2016-09-11 MED ORDER — LOSARTAN POTASSIUM 50 MG PO TABS
100.0000 mg | ORAL_TABLET | Freq: Every day | ORAL | Status: DC
  Administered 2016-09-12: 100 mg via ORAL
  Filled 2016-09-11: qty 2

## 2016-09-11 MED ORDER — ATORVASTATIN CALCIUM 10 MG PO TABS
10.0000 mg | ORAL_TABLET | Freq: Every day | ORAL | Status: DC
  Administered 2016-09-12: 10 mg via ORAL
  Filled 2016-09-11: qty 1

## 2016-09-11 MED ORDER — HEPARIN (PORCINE) IN NACL 2-0.9 UNIT/ML-% IJ SOLN
INTRAMUSCULAR | Status: DC | PRN
  Administered 2016-09-11: 11:00:00 via INTRA_ARTERIAL

## 2016-09-11 MED ORDER — SODIUM CHLORIDE 0.9 % IV SOLN: 250.0000 mL | INTRAVENOUS | Status: DC | PRN

## 2016-09-11 MED ORDER — ADENOSINE (DIAGNOSTIC) 140MCG/KG/MIN
INTRAVENOUS | Status: DC | PRN
  Administered 2016-09-11: 140 ug/kg/min via INTRAVENOUS

## 2016-09-11 MED ORDER — FENTANYL CITRATE (PF) 100 MCG/2ML IJ SOLN
INTRAMUSCULAR | Status: AC
  Filled 2016-09-11: qty 2

## 2016-09-11 MED ORDER — LIDOCAINE HCL (PF) 1 % IJ SOLN
INTRAMUSCULAR | Status: DC | PRN
  Administered 2016-09-11: 2 mL

## 2016-09-11 MED ORDER — FOLIC ACID 1 MG PO TABS
1.0000 mg | ORAL_TABLET | Freq: Every day | ORAL | Status: DC
  Administered 2016-09-12: 11:00:00 1 mg via ORAL
  Filled 2016-09-11: qty 1

## 2016-09-11 MED ORDER — LABETALOL HCL 5 MG/ML IV SOLN: 10.0000 mg | INTRAVENOUS | Status: AC | PRN

## 2016-09-11 MED ORDER — LIDOCAINE HCL (PF) 1 % IJ SOLN
INTRAMUSCULAR | Status: AC
  Filled 2016-09-11: qty 30

## 2016-09-11 MED ORDER — SODIUM CHLORIDE 0.9 % WEIGHT BASED INFUSION
3.0000 mL/kg/h | INTRAVENOUS | Status: DC
  Administered 2016-09-11: 3 mL/kg/h via INTRAVENOUS

## 2016-09-11 MED ORDER — ASPIRIN 81 MG PO CHEW
81.0000 mg | CHEWABLE_TABLET | ORAL | Status: AC
  Administered 2016-09-11: 81 mg via ORAL

## 2016-09-11 MED ORDER — ASPIRIN 81 MG PO CHEW
CHEWABLE_TABLET | ORAL | Status: AC
  Filled 2016-09-11: qty 1

## 2016-09-11 MED ORDER — ONDANSETRON HCL 4 MG/2ML IJ SOLN: 4.0000 mg | Freq: Four times a day (QID) | INTRAMUSCULAR | Status: DC | PRN

## 2016-09-11 MED ORDER — CLOPIDOGREL BISULFATE 300 MG PO TABS
ORAL_TABLET | ORAL | Status: AC
  Filled 2016-09-11: qty 2

## 2016-09-11 MED ORDER — CLOPIDOGREL BISULFATE 75 MG PO TABS
75.0000 mg | ORAL_TABLET | Freq: Every day | ORAL | Status: DC
  Administered 2016-09-12: 75 mg via ORAL
  Filled 2016-09-11: qty 1

## 2016-09-11 MED ORDER — SODIUM CHLORIDE 0.9 % IV SOLN
INTRAVENOUS | Status: AC
  Administered 2016-09-11: 13:00:00 via INTRAVENOUS

## 2016-09-11 MED ORDER — VERAPAMIL HCL 2.5 MG/ML IV SOLN
INTRAVENOUS | Status: AC
  Filled 2016-09-11: qty 2

## 2016-09-11 MED ORDER — HEPARIN (PORCINE) IN NACL 2-0.9 UNIT/ML-% IJ SOLN
INTRAMUSCULAR | Status: AC
  Filled 2016-09-11: qty 1000

## 2016-09-11 MED ORDER — SODIUM CHLORIDE 0.9% FLUSH: 3.0000 mL | INTRAVENOUS | Status: DC | PRN

## 2016-09-11 MED ORDER — FENTANYL CITRATE (PF) 100 MCG/2ML IJ SOLN
INTRAMUSCULAR | Status: DC | PRN
  Administered 2016-09-11 (×2): 25 ug via INTRAVENOUS

## 2016-09-11 MED ORDER — ASPIRIN 81 MG PO CHEW
81.0000 mg | CHEWABLE_TABLET | Freq: Every day | ORAL | Status: DC
  Administered 2016-09-12: 81 mg via ORAL
  Filled 2016-09-11: qty 1

## 2016-09-11 MED ORDER — SODIUM CHLORIDE 0.9 % WEIGHT BASED INFUSION: 1.0000 mL/kg/h | INTRAVENOUS | Status: DC

## 2016-09-11 MED ORDER — HEPARIN (PORCINE) IN NACL 2-0.9 UNIT/ML-% IJ SOLN
INTRAMUSCULAR | Status: DC | PRN
  Administered 2016-09-11: 1000 mL

## 2016-09-11 MED ORDER — MIDAZOLAM HCL 2 MG/2ML IJ SOLN
INTRAMUSCULAR | Status: AC
  Filled 2016-09-11: qty 2

## 2016-09-11 MED ORDER — IOPAMIDOL (ISOVUE-370) INJECTION 76%
INTRAVENOUS | Status: AC
  Filled 2016-09-11: qty 100

## 2016-09-11 MED ORDER — NITROGLYCERIN 0.4 MG SL SUBL: 0.4000 mg | SUBLINGUAL_TABLET | SUBLINGUAL | Status: DC | PRN

## 2016-09-11 MED ORDER — SODIUM CHLORIDE 0.9% FLUSH: 3.0000 mL | Freq: Two times a day (BID) | INTRAVENOUS | Status: DC

## 2016-09-11 MED ORDER — PREDNISONE 5 MG PO TABS
10.0000 mg | ORAL_TABLET | Freq: Every day | ORAL | Status: DC
  Administered 2016-09-12: 10 mg via ORAL
  Filled 2016-09-11: qty 2

## 2016-09-11 MED ORDER — MORPHINE SULFATE (PF) 2 MG/ML IV SOLN
2.0000 mg | INTRAVENOUS | Status: DC | PRN
  Administered 2016-09-11: 17:00:00 2 mg via INTRAVENOUS
  Filled 2016-09-11: qty 1

## 2016-09-11 MED ORDER — ACETAMINOPHEN 500 MG PO TABS: 1000.0000 mg | ORAL_TABLET | Freq: Four times a day (QID) | ORAL | Status: DC | PRN

## 2016-09-11 MED ORDER — HEPARIN SODIUM (PORCINE) 1000 UNIT/ML IJ SOLN
INTRAMUSCULAR | Status: DC | PRN
  Administered 2016-09-11 (×2): 4000 [IU] via INTRAVENOUS
  Administered 2016-09-11: 5000 [IU] via INTRAVENOUS

## 2016-09-11 MED ORDER — PANTOPRAZOLE SODIUM 40 MG PO TBEC
40.0000 mg | DELAYED_RELEASE_TABLET | Freq: Every day | ORAL | Status: DC
  Administered 2016-09-11 – 2016-09-12 (×2): 40 mg via ORAL
  Filled 2016-09-11 (×2): qty 1

## 2016-09-11 MED ORDER — BENZONATATE 100 MG PO CAPS: 200.0000 mg | ORAL_CAPSULE | Freq: Three times a day (TID) | ORAL | Status: DC | PRN

## 2016-09-11 MED ORDER — HEPARIN SODIUM (PORCINE) 1000 UNIT/ML IJ SOLN
INTRAMUSCULAR | Status: AC
  Filled 2016-09-11: qty 1

## 2016-09-11 MED ORDER — MELATONIN 3 MG PO TABS
6.0000 mg | ORAL_TABLET | Freq: Every day | ORAL | Status: DC
  Administered 2016-09-11: 22:00:00 6 mg via ORAL
  Filled 2016-09-11: qty 2

## 2016-09-11 MED ORDER — HYDRALAZINE HCL 20 MG/ML IJ SOLN: 5.0000 mg | INTRAMUSCULAR | Status: AC | PRN

## 2016-09-11 MED ORDER — LOSARTAN POTASSIUM-HCTZ 100-25 MG PO TABS: 1.0000 | ORAL_TABLET | Freq: Every day | ORAL | Status: DC

## 2016-09-11 MED ORDER — INFLUENZA VAC SPLIT QUAD 0.5 ML IM SUSY
0.5000 mL | PREFILLED_SYRINGE | INTRAMUSCULAR | Status: AC
  Administered 2016-09-12: 11:00:00 0.5 mL via INTRAMUSCULAR
  Filled 2016-09-11: qty 0.5

## 2016-09-11 MED ORDER — MIDAZOLAM HCL 2 MG/2ML IJ SOLN
INTRAMUSCULAR | Status: DC | PRN
  Administered 2016-09-11 (×3): 1 mg via INTRAVENOUS

## 2016-09-11 MED ORDER — IOPAMIDOL (ISOVUE-370) INJECTION 76%
INTRAVENOUS | Status: DC | PRN
  Administered 2016-09-11: 115 mL via INTRAVENOUS

## 2016-09-11 MED ORDER — RALOXIFENE HCL 60 MG PO TABS
60.0000 mg | ORAL_TABLET | Freq: Every day | ORAL | Status: DC
  Administered 2016-09-11 – 2016-09-12 (×2): 60 mg via ORAL
  Filled 2016-09-11 (×2): qty 1

## 2016-09-11 SURGICAL SUPPLY — 19 items
BALLN MOZEC 2.50X14 (BALLOONS) ×2
BALLN ~~LOC~~ MOZEC 3.0X10 (BALLOONS) ×3
BALLOON MOZEC 2.50X14 (BALLOONS) IMPLANT
BALLOON ~~LOC~~ MOZEC 3.0X10 (BALLOONS) IMPLANT
CATH MICROCATH NAVVUS (MICROCATHETER) IMPLANT
CATH OPTITORQUE TIG 4.0 5F (CATHETERS) ×2 IMPLANT
CATH VISTA GUIDE 6FR XBLAD3.5 (CATHETERS) ×2 IMPLANT
DEVICE RAD COMP TR BAND LRG (VASCULAR PRODUCTS) ×2 IMPLANT
GLIDESHEATH SLEND A-KIT 6F 22G (SHEATH) ×2 IMPLANT
GUIDEWIRE INQWIRE 1.5J.035X260 (WIRE) IMPLANT
INQWIRE 1.5J .035X260CM (WIRE) ×3
KIT ENCORE 26 ADVANTAGE (KITS) ×2 IMPLANT
KIT HEART LEFT (KITS) ×3 IMPLANT
MICROCATHETER NAVVUS (MICROCATHETER) ×3
PACK CARDIAC CATHETERIZATION (CUSTOM PROCEDURE TRAY) ×3 IMPLANT
STENT SYNERGY DES 2.75X16 (Permanent Stent) ×2 IMPLANT
TRANSDUCER W/STOPCOCK (MISCELLANEOUS) ×3 IMPLANT
TUBING CIL FLEX 10 FLL-RA (TUBING) ×3 IMPLANT
WIRE ASAHI PROWATER 180CM (WIRE) ×2 IMPLANT

## 2016-09-11 NOTE — Care Management Note (Addendum)
Case Management Note  Patient Details  Name: [name] [name] MRN: [id number] Date of Birth: 06/06/1941  Subjective/Objective:    S/p coronary stent intervention, patient will be on plavix,she lives with spouse, pta indep, she has a pcp, Dr. Hassie Bruce with [facility].  NCM will cont to follow for dc needs.                Action/Plan:   Expected Discharge Date:                  Expected Discharge Plan:  Home/Self Care  In-House Referral:     Discharge planning Services  CM Consult  Post Acute Care Choice:    Choice offered to:     DME Arranged:    DME Agency:     HH Arranged:    HH Agency:     Status of Service:  In process, will continue to follow  If discussed at Long Length of Stay Meetings, dates discussed:    Additional Comments:  Zenon Mayo, RN 09/11/2016, 2:07 PM

## 2016-09-11 NOTE — Progress Notes (Signed)
TR BAND REMOVAL  LOCATION:    right radial  DEFLATED PER PROTOCOL:    Yes.    TIME BAND OFF / DRESSING APPLIED: 1645   SITE UPON ARRIVAL:    Level 0  SITE AFTER BAND REMOVAL:    Level 0  CIRCULATION SENSATION AND MOVEMENT:    Within Normal Limits   Yes.    COMMENTS:     

## 2016-09-11 NOTE — Interval H&P Note (Signed)
History and Physical Interval Note:  09/11/2016 10:17 AM  [name]  has presented today for surgery, with the diagnosis of abnormal stress test & Echo done to evaluate exertional dyspnea    The various methods of treatment have been discussed with the patient and family. After consideration of risks, benefits and other options for treatment, the patient has consented to  Procedure(s): Left Heart Cath and Coronary Angiography (N/A) with possible Percutaneous Coronary Intervention as a surgical intervention .  The patient's history has been reviewed, patient examined, no change in status, stable for surgery.  I have reviewed the patient's chart and labs.  Questions were answered to the patient's satisfaction.    Cath Lab Visit (complete for each Cath Lab visit)  Clinical Evaluation Leading to the Procedure:   ACS: No.  Non-ACS:    Anginal Classification: CCS II  Anti-ischemic medical therapy: Minimal Therapy (1 class of medications)  Non-Invasive Test Results: Intermediate-risk stress test findings: cardiac mortality 1-3%/year  Prior CABG: No previous CABG    Glenetta Hew

## 2016-09-11 NOTE — H&P (View-Only) (Signed)
09/08/2016 [name] [name]   Aug 22, 1941  [id number]  Primary Physician [name], MD Primary Cardiologist: New  Reason for Visit/CC: Exertional Dyspnea, Abnormal EKG, Stress Test and 2D echo  HPI:  [name] [name] presents back to clinic, with her daughter, for f/u and to review recent test results. I first evaluated her on 08/25/16 as a new patient. Dr. Curt Bears was also present as DOD. He also examined her and recommended ischemic w/u.  To summarize, she is a 76 y/o female who was referred by her PCP, Dr. Glori Bickers, for evaluation. She was seen by PCP on 08/20/16 and noted recent symptoms of exertional dyspnea. She was also found to have a new LBBB on EKG.  Per records, she has a h/o mild nonobstructive CAD that was noted on cath back in 2001. There was 30% LM stenosis, 30% ostial LAD and 20% proximal RCA stenosis. LVEF was normal at that time at 60%. She had not been followed by a cardiologist since that time. Of note, she also has a h/o HLD but not currently on a statin. No h/o DM nor tobacco use. No family h/o of CAD.   Symptoms have been ongoing for the last 3-4 months. She is asymptomatic at rest. Only short of breath with activity. She notes symptoms of orthopnea for the last 2-3 weeks. She has been sleeping in her recliner. No LEE or weight gain. She has a dry cough. No chest pain, palpitations, dizziness, syncope, near syncope.  At her new patient evaluation, we ordered a pro-BNP which was WNL. 2D echo showed reduced LVEF at 45-50% (previously 60%). There is also hypokinesis of the anteroseptal and inferoseptal myocardium, grade 1 DD and moderate aortic insufficiency. Her stress test is intermediate risk, with EF estimate of 30-44%, with a small inferior apical and septal wall infarct w/o ischemia. Findings suggest prior MI. Infarct territory correlates to area of WMA noted on echo.  She continues to note exertional dyspnea. She gets short of breath walking short distances. She denies resting  dyspnea. She also denies CP. In clinic today, BP is 144/76. Pulse rate is 100. She is not on a BB.   Of note, she also has a h/o chronic anemia and has been followed by hematology. Hgb has been stable, over the past 2 years, in the 9-10 range. She denies melena.    Current Meds  Medication Sig  . amoxicillin (AMOXIL) 500 MG capsule Take 2,000 mg by mouth as needed.   Marland Kitchen atorvastatin (LIPITOR) 10 MG tablet Take 1 tablet (10 mg total) by mouth daily. *Please schedule a follow up appointment with Dr Glori Bickers for refills. (Patient not taking: Reported on 09/08/2016)  . benzonatate (TESSALON) 200 MG capsule Take 1 capsule (200 mg total) by mouth 3 (three) times daily as needed for cough. (Patient not taking: Reported on 09/08/2016)  . busPIRone (BUSPAR) 15 MG tablet TAKE 1/2 TO 1 TABLET BY MOUTH TWICE A DAY (Patient taking differently: Take 15mg s in the morning and 7.5mg s at night)  . clotrimazole-betamethasone (LOTRISONE) cream APPLY TO AFFECTED AREA TWICE A DAY UNTIL CLEARED (Patient taking differently: APPLY TO AFFECTED AREA TWICE A DAY AS NEEDED IRRITATION)  . esomeprazole (NEXIUM) 40 MG capsule Take 1 capsule (40 mg total) by mouth daily.  . hyoscyamine (LEVSIN, ANASPAZ) 0.125 MG tablet TAKE 1 TABLET BY MOUTH AS NEEDED. (Patient taking differently: TAKE 1 TABLET BY MOUTH AS NEEDED STOMACH SPASMS)  . losartan-hydrochlorothiazide (HYZAAR) 100-25 MG tablet Take 1 tablet by mouth daily.  Marland Kitchen  Melatonin 3 MG CAPS Take 6 mg by mouth at bedtime.  . methotrexate (50 MG/ML) 1 g injection Inject 0.4 mg into the vein once a week.  Marland Kitchen oxymetazoline (AFRIN) 0.05 % nasal spray Place 2 sprays into the nose 2 (two) times daily as needed for congestion.  . predniSONE (DELTASONE) 10 MG tablet Take 10 mg by mouth daily with breakfast.  . raloxifene (EVISTA) 60 MG tablet Take 1 tablet (60 mg total) by mouth daily.  . [DISCONTINUED] FOLIC ACID PO Take 1 tablet by mouth daily.   Allergies  Allergen Reactions  . Ace  Inhibitors     REACTION: cough  . Amoxicillin-Pot Clavulanate     REACTION: gi upset Has patient had a PCN reaction causing immediate rash, facial/tongue/throat swelling, SOB or lightheadedness with hypotension: No Has patient had a PCN reaction causing severe rash involving mucus membranes or skin necrosis: No Has patient had a PCN reaction that required hospitalization No Has patient had a PCN reaction occurring within the last 10 years: No If all of the above answers are "NO", then may proceed with Cephalosporin use.   Marland Kitchen Oxaprozin     REACTION: unknown  . Pregabalin     REACTION: swelling  . Sulfonamide Derivatives     REACTION: UNKNOWN   Past Medical History:  Diagnosis Date  . Anemia    of chronic disease  . Anxiety   . Arthritis    osteoarthritis  . Complication of anesthesia 1980 and 1982   trouble waking up after breast biopsy  . Depression   . Fibrocystic breast   . Hx of skin cancer, basal cell 2013 and 2012  . Hypertension   . Osteopenia   . Peptic ulcer disease    H pylori  . Psoriasis    Family History  Problem Relation Age of Onset  . Arthritis Mother   . Emphysema Mother   . Hyperlipidemia Mother   . Hypertension Mother   . Heart disease Brother     CAD   Past Surgical History:  Procedure Laterality Date  . ABDOMINAL HYSTERECTOMY     partial in 1970s  . BREAST BIOPSY Left 1980 and 1982  . BREAST SURGERY    . CATARACT EXTRACTION, BILATERAL  July and August2017  . CHOLECYSTECTOMY  2005  . ESOPHAGOGASTRODUODENOSCOPY  03/2009   ulcer,HP, GERD stricture  . HIP ARTHROPLASTY Left 2010  . JOINT REPLACEMENT Right 2009   knee  . KNEE SURGERY     x3  . NASAL SINUS SURGERY    . SPINE SURGERY     back surgery x 3  . SPINE SURGERY  2005   deg. disk lumber spine   . TOTAL HIP ARTHROPLASTY Right 04/04/2013   Procedure: RIGHT TOTAL HIP ARTHROPLASTY ANTERIOR APPROACH;  Surgeon: Mauri Pole, MD;  Location: [facility];  Service: Orthopedics;  Laterality:  Right;   Social History   Social History  . Marital status: Married    Spouse name: N/A  . Number of children: N/A  . Years of education: N/A   Occupational History  . Not on file.   Social History Main Topics  . Smoking status: Never Smoker  . Smokeless tobacco: Never Used  . Alcohol use 0.0 oz/week     Comment: occasional  . Drug use: No  . Sexual activity: Not Currently   Other Topics Concern  . Not on file   Social History Narrative  . No narrative on file     Review  of Systems: General: negative for chills, fever, night sweats or weight changes.  Cardiovascular: negative for chest pain, dyspnea on exertion, edema, orthopnea, palpitations, paroxysmal nocturnal dyspnea or shortness of breath Dermatological: negative for rash Respiratory: negative for cough or wheezing Urologic: negative for hematuria Abdominal: negative for nausea, vomiting, diarrhea, bright red blood per rectum, melena, or hematemesis Neurologic: negative for visual changes, syncope, or dizziness All other systems reviewed and are otherwise negative except as noted above.   Physical Exam:  Blood pressure (!) 144/76, pulse 100, height 5\' 4"  (1.626 m), weight 201 lb 12.8 oz (91.5 kg).  General appearance: alert, cooperative and no distress Neck: no carotid bruit and no JVD Lungs: clear to auscultation bilaterally Heart: regular rate and rhythm, S1, S2 normal, no murmur, click, rub or gallop Extremities: extremities normal, atraumatic, no cyanosis or edema Pulses: 2+ and symmetric Skin: Skin color, texture, turgor normal. No rashes or lesions Neurologic: Grossly normal  EKG not performed   ASSESSMENT AND PLAN:   1. Exertional Dyspnea, Abnormal EKG, Stress Test and 2D echo: as outlined above, in HPI, suspect patient has underlying obstructive CAD explaining her recent symptoms, reduced EF, abnormal EKG, stress test and 2D echo with regional WMAs. She denies resting symptoms but notes exertional  dyspnea. Previous LHC in 2001 showed CAD but it was nonobstructive at that time. Recommend repeat LHC this week at [facility] I discussed indication for cath as well as procedural details and possible risk, including risk for death, MI, stroke, vascular injury, bleeding, nephrotoxicity and allergic reaction. She understands these risk and agrees to proceed with definitive testing. Her BP is mildly elevated and her pulse rate is 100. We will start her on a low dose BB, metoprolol 12.5 mg BID (I feel that 25 mg BID is more reasonable, but patient wishes to avoid starting on higher dose as she had issues with fatigue with BBs in the past). Will also give Rx for PRN SLNTG. We discussed proper use of SLNTG. Patient advised to go to ER if development of unstable symptoms, unrelieved with NTG. She and her daughter verbalized understanding.     Lyda Jester PA-C 09/08/2016 1:03 PM

## 2016-09-12 ENCOUNTER — Other Ambulatory Visit: Payer: Self-pay

## 2016-09-12 DIAGNOSIS — I252 Old myocardial infarction: Secondary | ICD-10-CM | POA: Diagnosis not present

## 2016-09-12 DIAGNOSIS — I10 Essential (primary) hypertension: Secondary | ICD-10-CM | POA: Diagnosis not present

## 2016-09-12 DIAGNOSIS — I351 Nonrheumatic aortic (valve) insufficiency: Secondary | ICD-10-CM | POA: Diagnosis not present

## 2016-09-12 DIAGNOSIS — I447 Left bundle-branch block, unspecified: Secondary | ICD-10-CM | POA: Diagnosis not present

## 2016-09-12 DIAGNOSIS — I209 Angina pectoris, unspecified: Secondary | ICD-10-CM | POA: Diagnosis not present

## 2016-09-12 DIAGNOSIS — R0609 Other forms of dyspnea: Secondary | ICD-10-CM | POA: Diagnosis not present

## 2016-09-12 DIAGNOSIS — R9439 Abnormal result of other cardiovascular function study: Secondary | ICD-10-CM

## 2016-09-12 DIAGNOSIS — I2510 Atherosclerotic heart disease of native coronary artery without angina pectoris: Secondary | ICD-10-CM | POA: Diagnosis not present

## 2016-09-12 LAB — CBC
HCT: 30 % — ABNORMAL LOW (ref 36.0–46.0)
Hemoglobin: 9.4 g/dL — ABNORMAL LOW (ref 12.0–15.0)
MCH: 29.8 pg (ref 26.0–34.0)
MCHC: 31.3 g/dL (ref 30.0–36.0)
MCV: 95.2 fL (ref 78.0–100.0)
PLATELETS: 221 10*3/uL (ref 150–400)
RBC: 3.15 MIL/uL — AB (ref 3.87–5.11)
RDW: 15.1 % (ref 11.5–15.5)
WBC: 8.4 10*3/uL (ref 4.0–10.5)

## 2016-09-12 LAB — BASIC METABOLIC PANEL
Anion gap: 7 (ref 5–15)
BUN: 17 mg/dL (ref 6–20)
CHLORIDE: 107 mmol/L (ref 101–111)
CO2: 25 mmol/L (ref 22–32)
CREATININE: 1.11 mg/dL — AB (ref 0.44–1.00)
Calcium: 8.6 mg/dL — ABNORMAL LOW (ref 8.9–10.3)
GFR calc non Af Amer: 47 mL/min — ABNORMAL LOW (ref >60)
GFR, EST AFRICAN AMERICAN: 54 mL/min — AB (ref >60)
Glucose, Bld: 99 mg/dL (ref 65–99)
Potassium: 3.6 mmol/L (ref 3.5–5.1)
SODIUM: 139 mmol/L (ref 135–145)

## 2016-09-12 MED ORDER — ASPIRIN 81 MG PO CHEW: 81.0000 mg | CHEWABLE_TABLET | Freq: Every day | ORAL | 11 refills | Status: DC

## 2016-09-12 MED ORDER — PANTOPRAZOLE SODIUM 40 MG PO TBEC: 40.0000 mg | DELAYED_RELEASE_TABLET | Freq: Every day | ORAL | 11 refills | Status: DC

## 2016-09-12 MED ORDER — CLOPIDOGREL BISULFATE 75 MG PO TABS: 75.0000 mg | ORAL_TABLET | Freq: Every day | ORAL | 11 refills | Status: DC

## 2016-09-12 MED ORDER — ATORVASTATIN CALCIUM 10 MG PO TABS: 10.0000 mg | ORAL_TABLET | Freq: Every day | ORAL | 1 refills | Status: DC

## 2016-09-12 NOTE — Progress Notes (Signed)
Patient ambulated 500 ft without any assistance.  No complaints of chest pain or shortness of breath

## 2016-09-12 NOTE — Progress Notes (Signed)
Progress Note  Patient Name: [name] [name] Date of Encounter: 09/12/2016  Primary Cardiologist: new seen by DOD Dr. Curt Bears 08/25/16  Subjective   Feeling well. No chest pain, sob or palpitations.   Inpatient Medications    Scheduled Meds: . aspirin  81 mg Oral Daily  . atorvastatin  10 mg Oral Daily  . clopidogrel  75 mg Oral Q breakfast  . folic acid  1 mg Oral Daily  . hydrochlorothiazide  25 mg Oral Daily  . Influenza vac split quadrivalent PF  0.5 mL Intramuscular Tomorrow-1000  . losartan  100 mg Oral Daily  . Melatonin  6 mg Oral QHS  . metoprolol tartrate  12.5 mg Oral BID  . pantoprazole  40 mg Oral Daily  . predniSONE  10 mg Oral Q breakfast  . raloxifene  60 mg Oral Daily  . sodium chloride flush  3 mL Intravenous Q12H   Continuous Infusions:  PRN Meds: sodium chloride, acetaminophen, HYDROcodone-acetaminophen, morphine injection, nitroGLYCERIN, ondansetron (ZOFRAN) IV, sodium chloride flush   Vital Signs    Vitals:   09/11/16 1622 09/11/16 1939 09/12/16 0420 09/12/16 0718  BP: (!) 138/58 (!) 120/43 119/75 (!) 138/57  Pulse: 81 93 84 82  Resp: 15 20 14 15   Temp:  98.3 F (36.8 C) 97.5 F (36.4 C) 98.2 F (36.8 C)  TempSrc:  Oral Oral Oral  SpO2: 100% 98% 97% 96%  Weight:   207 lb 7.3 oz (94.1 kg)   Height:        Intake/Output Summary (Last 24 hours) at 09/12/16 0845 Last data filed at 09/12/16 0721  Gross per 24 hour  Intake             1220 ml  Output              775 ml  Net              445 ml   Filed Weights   09/11/16 0717 09/12/16 0420  Weight: 201 lb (91.2 kg) 207 lb 7.3 oz (94.1 kg)    Telemetry    NSR - Personally Reviewed  ECG    Sinus rhythm - Personally Reviewed  Physical Exam   GEN: No acute distress.  Neck: No JVD Cardiac: RRR, no murmurs, rubs, or gallops. Right Radial cath site stable Respiratory: Clear to auscultation bilaterally. GI: Soft, nontender, non-distended  MS: No edema; No deformity. Neuro:   AAOx3. Psych: Normal affect  Labs    Chemistry Recent Labs Lab 09/08/16 1104 09/12/16 0340  NA 142 139  K 3.8 3.6  CL 100 107  CO2 22 25  GLUCOSE 100* 99  BUN 21 17  CREATININE 0.98 1.11*  CALCIUM 9.0 8.6*  GFRNONAA 56* 47*  GFRAA 64 54*  ANIONGAP  --  7     Hematology Recent Labs Lab 09/08/16 1104 09/12/16 0340  WBC 13.2* 8.4  RBC 3.46* 3.15*  HGB  --  9.4*  HCT 32.3* 30.0*  MCV 93 95.2  MCH 30.6 29.8  MCHC 32.8 31.3  RDW 15.6* 15.1  PLT 331 221    Cardiac EnzymesNo results for input(s): TROPONINI in the last 168 hours. No results for input(s): TROPIPOC in the last 168 hours.   BNPNo results for input(s): BNP, PROBNP in the last 168 hours.   DDimer No results for input(s): DDIMER in the last 168 hours.   Radiology    No results found.  Cardiac Studies   Cath  09/11/16  Mid  Cx lesion, 90 %stenosed.  A STENT SYNERGY DES [id number] drug eluting stent was successfully placed.  Post intervention, there is a 0% residual stenosis.  _________________________________________  Colon Flattery 1st Diag lesion, 70 %stenosed. - Not good PCI/PTCA target  Ost 2nd Diag to 2nd Diag lesion, 40 %stenosed. Ost 3rd Diag to 3rd Diag lesion, 60 %stenosed.  Ost LAD lesion, 50 %stenosed. Dist LAD lesion, 50 %stenosed. - Combined FFR 8.93-8.1  LV end diastolic pressure is mildly elevated.   Successful DES stenting of the most significant culprit lesion in the circumflex without complication.   Plan:  Patient would prefer to be monitored overnight, her 4 we will admit her under outpatient with extended stay overnight.  Dual antiplatelet therapy for minimum of 3 months, with and the stop aspirin. -- If necessary, could barely hold Plavix at 6 months for surgery  Expected discharge tomorrow  I have written for her chronic pain medications  Prescribing statin along with additional cardiac risk factor modification medications  Patient Profile     76 y.o. female withi hx of  HTN  And  depression who seen in clinic 08/25/16 for evaluation for Exertional Dyspnea, Fatigue and new LBBB. She was referred by her PCP, Dr. Glori Bickers. She was also found to have a new LBBB on EKG.  Per records, she has a h/o mild nonobstructive CAD that was noted on cath back in 2001. There was 30% LM stenosis, 30% ostial LAD and 20% proximal RCA stenosis. LVEF was normal at that time at 60%. She has not been followed by a cardiologist since that time. Of note, she also has a h/o HLD but not currently on a statin. No h/o DM nor tobacco use. No family h/o of CAD.   her new patient evaluation, we ordered a pro-BNP which was WNL. 2D echo showed reduced LVEF at 45-50% (previously 60%). There is also hypokinesis of the anteroseptal and inferoseptal myocardium, grade 1 DD and moderate aortic insufficiency. Her stress test is intermediate risk, with EF estimate of 30-44%, with a small inferior apical and septal wall infarct w/o ischemia. Findings suggest prior MI. Infarct territory correlates to area of WMA noted on echo.  Suspect patient has underlying obstructive CAD explaining her recent symptoms, reduced EF, abnormal EKG, stress test and 2D echo with regional WMAs and schedule for outpatient cath.  She denies resting symptoms but notes exertional dyspnea.  Assessment & Plan    1. Exertional Dyspnea, Abnormal EKG, Stress Test and 2D echo:  - s/p Successful DES stenting of the most significant culprit lesion in the circumflex without complication. 70% stenosed 1st dig - not good target for PCI. Mild to moderate disease elsewhere as above.  - Continue ASA, plavix, statin and BB.   2. HLD - 12/10/2015: Cholesterol 202; HDL 37.60; Triglycerides 313.0; VLDL 62.6 Will need Lipid check as outpatient. She was off statin in past due to RA. Continue lipitor 10mg  for now. Consider increase dose as outpatient.   3. h/o chronic anemia and has been followed by hematology - Hgb minimally reduced to 9.4 from 10.6. CBC as  outpatient.   4. Chronic combined CHF - Euvolemic Continue ACE and ARB.   5. HTN - Stable. Continue current medications.   Signed, Leanor Kail, PA  09/12/2016, 8:45 AM    I have seen, examined the patient, and reviewed the above assessment and plan.  On exam, wrist looks good.  Ambulating slowly.  Changes to above are made where necessary.   The importance of close  follow-up, medical compliance, and cardiac rehab were discussed with the patient.  DC to home today with close outpatient follow-up.  Co Sign: Thompson Grayer, MD 09/12/2016 9:45 AM

## 2016-09-12 NOTE — Progress Notes (Signed)
Rounding done, no needs at this time.

## 2016-09-12 NOTE — Discharge Summary (Signed)
Discharge Summary    Patient ID: [name] [name],  MRN: [id number], DOB/AGE: October 12, 1940 76 y.o.  Admit date: 09/11/2016 Discharge date: 09/12/2016  Primary Care Provider: [facility] Primary Cardiologist: New seen by DOD Dr. Curt Bears 08/25/16  Discharge Diagnoses    Principal Problem:   Intermediate Risk Nuclear stress test: EF 35-40%, Small Inferior-apical & septal wall. Inferoapex HK. Active Problems:   Hyperlipidemia with low HDL   Essential hypertension   Dyspnea on exertion   Left bundle branch block (LBBB) on electrocardiogram   Angina, class III (HCC)   CAD s/p stent     Elevated Scr   Chronic combined CHF Allergies Allergies  Allergen Reactions  . Ace Inhibitors     REACTION: cough  . Amoxicillin-Pot Clavulanate     REACTION: gi upset Has patient had a PCN reaction causing immediate rash, facial/tongue/throat swelling, SOB or lightheadedness with hypotension: No Has patient had a PCN reaction causing severe rash involving mucus membranes or skin necrosis: No Has patient had a PCN reaction that required hospitalization No Has patient had a PCN reaction occurring within the last 10 years: No If all of the above answers are "NO", then may proceed with Cephalosporin use.   Marland Kitchen Oxaprozin     REACTION: unknown  . Pregabalin     REACTION: swelling  . Sulfonamide Derivatives     REACTION: UNKNOWN    Diagnostic Studies/Procedures   Cath  09/11/16  Mid Cx lesion, 90 %stenosed.  A STENT SYNERGY DES [id number] drug eluting stent was successfully placed.  Post intervention, there is a 0% residual stenosis.  _________________________________________  Colon Flattery 1st Diag lesion, 70 %stenosed. - Not good PCI/PTCA target  Ost 2nd Diag to 2nd Diag lesion, 40 %stenosed. Ost 3rd Diag to 3rd Diag lesion, 60 %stenosed.  Ost LAD lesion, 50 %stenosed. Dist LAD lesion, 50 %stenosed. - Combined FFR 3.53-2.9  LV end diastolic pressure is mildly elevated.  Successful DES stenting of  the most significant culprit lesion in the circumflex without complication.   Plan:  Patient would prefer to be monitored overnight, her 4 we will admit her under outpatient with extended stay overnight.  Dual antiplatelet therapy for minimum of 3 months, with and the stop aspirin. -- If necessary, could barely hold Plavix at 6 months for surgery  Expected discharge tomorrow  I have written for her chronic pain medications  Prescribing statin along with additional cardiac risk factor modification medications   History of Present Illness     76 y.o. female withi hx of HTN  And  depression who seen in clinic 08/25/16 for evaluation for Exertional Dyspnea, Fatigue and new LBBB. She was referred by her PCP, Dr. Glori Bickers. She was also found to have a new LBBB on EKG.  Per records, she has a h/o mild nonobstructive CAD that was noted on cath back in 2001. There was 30% LM stenosis, 30% ostial LAD and 20% proximal RCA stenosis. LVEF was normal at that time at 60%. She has not been followed by a cardiologist since that time. Of note, she also has a h/o HLD but not currently on a statin. No h/o DM nor tobacco use. No family h/o of CAD.   her new patient evaluation, we ordered a pro-BNP which was WNL. 2D echo showed reduced LVEF at 45-50% (previously 60%). There is also hypokinesis of the anteroseptal and inferoseptal myocardium, grade 1 DD and moderate aortic insufficiency. Her stress test is intermediate risk, with EF estimate of 30-44%,  with a small inferior apical and septal wall infarct w/o ischemia. Findings suggest prior MI. Infarct territory correlates to area of WMA noted on echo.  Suspect patient has underlying obstructive CAD explaining her recent symptoms, reduced EF, abnormal EKG, stress test and 2D echo with regional WMAs and schedule for outpatient cath.  She denies resting symptoms but notes exertional dyspnea.  Hospital Course     Consultants: CR  1. Exertional Dyspnea, Abnormal  EKG, Stress Test and 2D echo:  - s/p Successful DES stenting of the most significant culprit lesion in the circumflex without complication. 70% stenosed 1st dig - not good target for PCI. Mild to moderate disease elsewhere as above.  - Continue ASA, plavix, statin and BB.   2. HLD - 12/10/2015: Cholesterol 202; HDL 37.60; Triglycerides 313.0; VLDL 62.6 Will need Lipid check as outpatient. She was off statin in past due to RA. Continue lipitor 10mg  for now. Consider increase dose as outpatient vs lipid clinic referral.   3. h/o chronic anemia and has been followed by hematology - Hgb minimally reduced to 9.4 from 10.6. CBC as outpatient.   4. Chronic combined CHF - Euvolemic Continue ACE and ARB.   5. HTN - Stable. Continue current medications.   6. Elevated Scr - minimally increased to 1.11 post cath. BMET as outpatient   The patient has been seen by Dr. Rayann Heman today and deemed ready for discharge home. All follow-up appointments have been scheduled. Discharge medications are listed below.  _____________   Discharge Vitals Blood pressure (!) 138/57, pulse 82, temperature 98.2 F (36.8 C), temperature source Oral, resp. rate 15, height 5\' 4"  (1.626 m), weight 207 lb 7.3 oz (94.1 kg), SpO2 96 %.  Filed Weights   09/11/16 0717 09/12/16 0420  Weight: 201 lb (91.2 kg) 207 lb 7.3 oz (94.1 kg)    Labs & Radiologic Studies     CBC  Recent Labs  09/12/16 0340  WBC 8.4  HGB 9.4*  HCT 30.0*  MCV 95.2  PLT 818   Basic Metabolic Panel  Recent Labs  09/12/16 0340  NA 139  K 3.6  CL 107  CO2 25  GLUCOSE 99  BUN 17  CREATININE 1.11*  CALCIUM 8.6*   Liver Function Tests No results for input(s): AST, ALT, ALKPHOS, BILITOT, PROT, ALBUMIN in the last 72 hours. No results for input(s): LIPASE, AMYLASE in the last 72 hours. Cardiac Enzymes No results for input(s): CKTOTAL, CKMB, CKMBINDEX, TROPONINI in the last 72 hours. BNP Invalid input(s): POCBNP D-Dimer No results  for input(s): DDIMER in the last 72 hours. Hemoglobin A1C No results for input(s): HGBA1C in the last 72 hours. Fasting Lipid Panel No results for input(s): CHOL, HDL, LDLCALC, TRIG, CHOLHDL, LDLDIRECT in the last 72 hours. Thyroid Function Tests No results for input(s): TSH, T4TOTAL, T3FREE, THYROIDAB in the last 72 hours.  Invalid input(s): FREET3  Dg Chest 2 View  Result Date: 08/19/2016 CLINICAL DATA:  Acute onset of shortness of breath on exertion. Initial encounter. EXAM: CHEST  2 VIEW COMPARISON:  Chest radiograph performed 11/17/2013 FINDINGS: The lungs are well-aerated and clear. There is no evidence of focal opacification, pleural effusion or pneumothorax. The heart is borderline normal in size. No acute osseous abnormalities are seen. IMPRESSION: No acute cardiopulmonary process seen. Electronically Signed   By: Garald Balding M.D.   On: 08/19/2016 19:49    Disposition   Pt is being discharged home today in good condition.  Follow-up Plans & Appointments  Follow-up Information    Lyda Jester, PA-C Follow up on 09/24/2016.   Specialties:  Cardiology, Radiology Why:  2:30 PM  Contact information: [place] [address] [phone]          Discharge Instructions    Diet - low sodium heart healthy    Complete by:  As directed    Discharge instructions    Complete by:  As directed    No driving for 48 horus. No lifting over 5 lbs for 1 week. No sexual activity for 1 week. Keep procedure site clean & dry. If you notice increased pain, swelling, bleeding or pus, call/return!  You may shower, but no soaking baths/hot tubs/pools for 1 week.   Increase activity slowly    Complete by:  As directed       Discharge Medications   Current Discharge Medication List    START taking these medications   Details  aspirin 81 MG chewable tablet Chew 1 tablet (81 mg total) by mouth daily. Qty: 30 tablet, Refills: 11    clopidogrel (PLAVIX)  75 MG tablet Take 1 tablet (75 mg total) by mouth daily with breakfast. Qty: 30 tablet, Refills: 11    pantoprazole (PROTONIX) 40 MG tablet Take 1 tablet (40 mg total) by mouth daily. Qty: 30 tablet, Refills: 11      CONTINUE these medications which have NOT CHANGED   Details  acetaminophen (TYLENOL) 500 MG tablet Take 1,000 mg by mouth every 6 (six) hours as needed for moderate pain.    busPIRone (BUSPAR) 15 MG tablet TAKE 1/2 TO 1 TABLET BY MOUTH TWICE A DAY Qty: 180 tablet, Refills: 0    folic acid (FOLVITE) 1 MG tablet Take 1 mg by mouth daily.    HYDROcodone-acetaminophen (NORCO/VICODIN) 5-325 MG tablet Take 1 tablet by mouth 2 (two) times daily as needed for pain.    losartan-hydrochlorothiazide (HYZAAR) 100-25 MG tablet Take 1 tablet by mouth daily. Qty: 90 tablet, Refills: 3    Melatonin 3 MG CAPS Take 6 mg by mouth at bedtime.    methotrexate (50 MG/ML) 1 g injection Inject 0.4 mg into the vein once a week.    oxymetazoline (AFRIN) 0.05 % nasal spray Place 2 sprays into the nose 2 (two) times daily as needed for congestion.    predniSONE (DELTASONE) 10 MG tablet Take 10 mg by mouth daily with breakfast.    raloxifene (EVISTA) 60 MG tablet Take 1 tablet (60 mg total) by mouth daily. Qty: 90 tablet, Refills: 3    amoxicillin (AMOXIL) 500 MG capsule Take 2,000 mg by mouth as needed.     atorvastatin (LIPITOR) 10 MG tablet Take 1 tablet (10 mg total) by mouth daily. *Please schedule a follow up appointment with Dr Glori Bickers for refills. Qty: 30 tablet, Refills: 1    benzonatate (TESSALON) 200 MG capsule Take 1 capsule (200 mg total) by mouth 3 (three) times daily as needed for cough. Qty: 30 capsule, Refills: 1    clotrimazole-betamethasone (LOTRISONE) cream APPLY TO AFFECTED AREA TWICE A DAY UNTIL CLEARED Qty: 30 g, Refills: 1    hyoscyamine (LEVSIN, ANASPAZ) 0.125 MG tablet TAKE 1 TABLET BY MOUTH AS NEEDED. Qty: 30 tablet, Refills: 4    metoprolol tartrate (LOPRESSOR)  25 MG tablet Take 0.5 tablets (12.5 mg total) by mouth 2 (two) times daily. Qty: 30 tablet, Refills: 5    nitroGLYCERIN (NITROSTAT) 0.4 MG SL tablet Place 1 tablet (0.4 mg total) under the tongue  every 5 (five) minutes as needed for chest pain. Qty: 25 tablet, Refills: 5      STOP taking these medications     esomeprazole (NEXIUM) 40 MG capsule           Outstanding Labs/Studies   BEMT, CBC and lipid profile  Duration of Discharge Encounter   Greater than 30 minutes including physician time.  Signed, Bhagat,Bhavinkumar PA-C 09/12/2016, 9:58 AM   Trude Mcburney

## 2016-09-12 NOTE — Progress Notes (Signed)
CARDIAC REHAB PHASE I   PRE:  Rate/Rhythm: 86 nsr with occ pvcs   BP:  Sitting: 133/54      SaO2: 96 ra  MODE:  Ambulation: 400 ft   POST:  Rate/Rhythm: 116 st with occ pvc  BP:  Sitting: 154/66     SaO2: 95 ra 0915-1000 Patient ambulated in hallway x 1 assist accompanied by cardiac rehab RN and patients daughter. C/O chronic back pain and shortness of breath. Patient states her shortness of breath began 6 months ago. Dr. Rayann Heman notified. Strongly encouraged and promoted phase 2 cardiac rehab. With patients permission order placed. Discharge education completed including antiplatet therapy, activity progression, ntg, diet, and risk factors.  Bretta Fees English PayneRN, BSN 09/12/2016 10:03 AM

## 2016-09-17 LAB — PRO B NATRIURETIC PEPTIDE: NT-Pro BNP: 604 pg/mL (ref 0–738)

## 2016-09-17 LAB — SPECIMEN STATUS REPORT

## 2016-09-24 ENCOUNTER — Ambulatory Visit: Payer: Medicare Other | Admitting: Cardiology

## 2016-09-29 ENCOUNTER — Ambulatory Visit (INDEPENDENT_AMBULATORY_CARE_PROVIDER_SITE_OTHER): Payer: Medicare Other | Admitting: Cardiology

## 2016-09-29 ENCOUNTER — Encounter: Payer: Self-pay | Admitting: Cardiology

## 2016-09-29 VITALS — BP 162/88 | HR 79 | Ht 64.0 in | Wt 202.1 lb

## 2016-09-29 DIAGNOSIS — Z955 Presence of coronary angioplasty implant and graft: Secondary | ICD-10-CM | POA: Diagnosis not present

## 2016-09-29 DIAGNOSIS — Z951 Presence of aortocoronary bypass graft: Secondary | ICD-10-CM

## 2016-09-29 DIAGNOSIS — Z5181 Encounter for therapeutic drug level monitoring: Secondary | ICD-10-CM | POA: Diagnosis not present

## 2016-09-29 DIAGNOSIS — D649 Anemia, unspecified: Secondary | ICD-10-CM | POA: Diagnosis not present

## 2016-09-29 MED ORDER — ATORVASTATIN CALCIUM 20 MG PO TABS: 20.0000 mg | ORAL_TABLET | Freq: Every day | ORAL | 6 refills | Status: DC

## 2016-09-29 MED ORDER — METOPROLOL TARTRATE 25 MG PO TABS: 25.0000 mg | ORAL_TABLET | Freq: Two times a day (BID) | ORAL | 5 refills | Status: DC

## 2016-09-29 NOTE — Progress Notes (Signed)
09/29/2016 [name] [name]   1940-09-08  [id number]  Primary Physician [name] Pardon, MD Primary Cardiologist: Dr. Ellyn [name]    Reason for Visit/CC: [facility] F/u for CAD   HPI:  [name] [name] presents to clinic today for post hospital f/u after recent PCI.   She first presented to our practice 08/25/16 as new patient after being referred by her PCP, Dr. Glori [name], for cardiac examination given new development of exertional dyspnea. A pro- BNP was checked and was normal. 2D echo showed  reduced LVEF at 45-50% (previously 60%) with hypokinesis of the anteroseptal and inferoseptal myocardium, grade 1 DD and moderate aortic insufficiency. She underwent ischemic w/u and her stress test was ruled intermediate risk, with EF estimate of 30-44%, with a small inferior apical and septal wall infarct w/o ischemia. Findings suggested prior MI. Infarct territory correlated to area of WMA noted on echo.   Subsequently, she was referred for definitive LHC. This was performed by Dr. Ellyn [name] at [facility] on 09/11/16. She was found to have a 90% mid Cx lesion, treated with PCI utilizing a DES. There was also a 70% 1st diagonal lesion, that was not a good PCI/PTCA target, as well as 40% 2nd diag, 60% 3rd diag and a 50% ostial LAD lesion and 50% distal LAD lesion that was negative by FFR testing. Medical therapy was recommended for her residual disease. She was placed on DAPT with ASA + Plavix and continued on a beta blocker and statin.   Today in f/u, she reports that she had done well. Her dyspnea has improved significantly, however she continues to have mild dyspnea. Exercise tolerance is greatly improved. She does have chronic anemia with baseline Hgb ~9-10 range, which may be contributing. She denies any melena after addition of Plavix but does note occasional abdominal/ mid epigastric discomfort. She is on Protonix and reports full compliance. She notes she had a gastric ulcer many years ago that resolved.   She reports full  medication compliance. She is on a low dose of Lipitor, 10 mg. She reports h/o intolerance to higher doses in the past due to myalgias, but she is willing to retry a higher dose. Her LDL 11/2015 was 107 mg/dL. Her BP is also elevated in clinic today, in the 563J systolic.    Current Meds  Medication Sig  . acetaminophen (TYLENOL) 500 MG tablet Take 1,000 mg by mouth every 6 (six) hours as needed for moderate pain.  Marland Kitchen amoxicillin (AMOXIL) 500 MG capsule Take 2,000 mg by mouth as needed.   Marland Kitchen aspirin 81 MG chewable tablet Chew 1 tablet (81 mg total) by mouth daily.  Marland Kitchen atorvastatin (LIPITOR) 20 MG tablet Take 1 tablet (20 mg total) by mouth daily.  . busPIRone (BUSPAR) 15 MG tablet TAKE 1/2 TO 1 TABLET BY MOUTH TWICE A DAY (Patient taking differently: Take 15mg s in the morning and 7.5mg s at night)  . clopidogrel (PLAVIX) 75 MG tablet Take 1 tablet (75 mg total) by mouth daily with breakfast.  . clotrimazole-betamethasone (LOTRISONE) cream APPLY TO AFFECTED AREA TWICE A DAY UNTIL CLEARED (Patient taking differently: APPLY TO AFFECTED AREA TWICE A DAY AS NEEDED IRRITATION)  . folic acid (FOLVITE) 1 MG tablet Take 1 mg by mouth daily.  Marland Kitchen HYDROcodone-acetaminophen (NORCO/VICODIN) 5-325 MG tablet Take 1 tablet by mouth 2 (two) times daily as needed for pain.  . hyoscyamine (LEVSIN, ANASPAZ) 0.125 MG tablet TAKE 1 TABLET BY MOUTH AS NEEDED. (Patient taking differently: TAKE 1 TABLET BY MOUTH AS NEEDED STOMACH  SPASMS)  . losartan-hydrochlorothiazide (HYZAAR) 100-25 MG tablet Take 1 tablet by mouth daily.  . Melatonin 3 MG CAPS Take 6 mg by mouth at bedtime.  . methotrexate (50 MG/ML) 1 g injection Inject 0.4 mg into the vein once a week.  . metoprolol tartrate (LOPRESSOR) 25 MG tablet Take 1 tablet (25 mg total) by mouth 2 (two) times daily.  . nitroGLYCERIN (NITROSTAT) 0.4 MG SL tablet Place 1 tablet (0.4 mg total) under the tongue every 5 (five) minutes as needed for chest pain.  Marland Kitchen oxymetazoline (AFRIN)  0.05 % nasal spray Place 2 sprays into the nose 2 (two) times daily as needed for congestion.  . pantoprazole (PROTONIX) 40 MG tablet Take 1 tablet (40 mg total) by mouth daily.  . predniSONE (DELTASONE) 10 MG tablet Take 10 mg by mouth daily with breakfast.  . raloxifene (EVISTA) 60 MG tablet Take 1 tablet (60 mg total) by mouth daily.  . [DISCONTINUED] atorvastatin (LIPITOR) 10 MG tablet Take 1 tablet (10 mg total) by mouth daily.  . [DISCONTINUED] metoprolol tartrate (LOPRESSOR) 25 MG tablet Take 0.5 tablets (12.5 mg total) by mouth 2 (two) times daily.   Allergies  Allergen Reactions  . Ace Inhibitors     REACTION: cough  . Amoxicillin-Pot Clavulanate     REACTION: gi upset Has patient had a PCN reaction causing immediate rash, facial/tongue/throat swelling, SOB or lightheadedness with hypotension: No Has patient had a PCN reaction causing severe rash involving mucus membranes or skin necrosis: No Has patient had a PCN reaction that required hospitalization No Has patient had a PCN reaction occurring within the last 10 years: No If all of the above answers are "NO", then may proceed with Cephalosporin use.   Marland Kitchen Oxaprozin     REACTION: unknown  . Pregabalin     REACTION: swelling  . Sulfonamide Derivatives     REACTION: UNKNOWN   Past Medical History:  Diagnosis Date  . Anemia    of chronic disease  . Anxiety   . Arthritis    "psoratic arthritis; new dx" (09/11/2016)  . Chronic lower back pain   . Complication of anesthesia 1980 and 1982   trouble waking up after breast biopsy  . Coronary artery disease   . Depression   . Fibrocystic breast   . Headache    "monthly" (09/11/2016)  . High cholesterol   . History of blood transfusion    "several since age 82; I've had them after most of my surgeries" (09/11/2016)  . Hx of skin cancer, basal cell 2013 and 2012   right inner, lower leg; several scattered around my body  . Hypertension   . Iron deficiency anemia    "I've had  an iron infusion"  . Myocardial infarction    "doctor said there are signs I've had a heart attack; don't know when" (09/11/2016)  . Osteoarthritis   . Osteopenia   . Peptic ulcer disease    H pylori  . PMR (polymyalgia rheumatica) (HCC)   . Psoriasis    Family History  Problem Relation Age of Onset  . Arthritis Mother   . Emphysema Mother   . Hyperlipidemia Mother   . Hypertension Mother   . Heart disease Brother     CAD   Past Surgical History:  Procedure Laterality Date  . APPENDECTOMY    . BACK SURGERY    . BREAST BIOPSY Left 1980 and 1982   "benign"  . CARDIAC CATHETERIZATION N/A 09/11/2016   Procedure: Left  Heart Cath and Coronary Angiography;  Surgeon: Leonie Man, MD;  Location: [facility];  Service: Cardiovascular;  Laterality: N/A;  . CARDIAC CATHETERIZATION N/A 09/11/2016   Procedure: Intravascular Pressure Wire/FFR Study;  Surgeon: Leonie Man, MD;  Location: [facility];  Service: Cardiovascular;  Laterality: N/A;  . CARDIAC CATHETERIZATION N/A 09/11/2016   Procedure: Coronary Stent Intervention;  Surgeon: Leonie Man, MD;  Location: [facility];  Service: Cardiovascular;  Laterality: N/A;  . CARDIAC CATHETERIZATION  2001  . CATARACT EXTRACTION W/ INTRAOCULAR LENS  IMPLANT, BILATERAL  July -  August 2017  . CORONARY ANGIOPLASTY    . DILATION AND CURETTAGE OF UTERUS  X 3  . ESOPHAGOGASTRODUODENOSCOPY  03/2009   ulcer,HP, GERD stricture  . EXCISIONAL HEMORRHOIDECTOMY    . HIP ARTHROPLASTY Left 2010  . JOINT REPLACEMENT    . KNEE ARTHROSCOPY Bilateral   . LAPAROSCOPIC CHOLECYSTECTOMY  2005  . LUMBAR DISC SURGERY  11/1984; 1987; 1989; 2004; 2005 X 2   deg. disk lumber spine   . MOHS SURGERY Right    "inside my lower leg"  . NASAL SINUS SURGERY  1970s  . NM MYOVIEW LTD  08/28/2016   EF ~35-40%.  ? Prior Small Inferior-apical & septal infarct (w/o ischemia).  Difuse HK - worse in inferoapex  . TOTAL HIP ARTHROPLASTY Right 04/04/2013     Procedure: RIGHT TOTAL HIP ARTHROPLASTY ANTERIOR APPROACH;  Surgeon: Mauri Pole, MD;  Location: [facility];  Service: Orthopedics;  Laterality: Right;  . TOTAL KNEE ARTHROPLASTY Right 2009  . TRANSTHORACIC ECHOCARDIOGRAM  08/2016   mild conc LVH. EF 45-50% - anteroseptal & inferoseptal HK. Mod AI.  Marland Kitchen TUBAL LIGATION    . VAGINAL HYSTERECTOMY  1970s   partial    Social History   Social History  . Marital status: Married    Spouse name: N/A  . Number of children: N/A  . Years of education: N/A   Occupational History  . Not on file.   Social History Main Topics  . Smoking status: Never Smoker  . Smokeless tobacco: Never Used  . Alcohol use 0.0 oz/week     Comment: 09/11/2016 "might have a margarita q 3-4 months"  . Drug use: No  . Sexual activity: Not Currently   Other Topics Concern  . Not on file   Social History Narrative  . No narrative on file     Review of Systems: General: negative for chills, fever, night sweats or weight changes.  Cardiovascular: negative for chest pain, dyspnea on exertion, edema, orthopnea, palpitations, paroxysmal nocturnal dyspnea or shortness of breath Dermatological: negative for rash Respiratory: negative for cough or wheezing Urologic: negative for hematuria Abdominal: negative for nausea, vomiting, diarrhea, bright red blood per rectum, melena, or hematemesis Neurologic: negative for visual changes, syncope, or dizziness All other systems reviewed and are otherwise negative except as noted above.   Physical Exam:  Blood pressure (!) 162/88, pulse 79, height 5\' 4"  (1.626 m), weight 202 lb 1.9 oz (91.7 kg), SpO2 97 %.  General appearance: alert, cooperative and no distress Neck: no carotid bruit and no JVD Lungs: clear to auscultation bilaterally Heart: regular rate and rhythm, S1, S2 normal, no murmur, click, rub or gallop Extremities: extremities normal, atraumatic, no cyanosis or edema Pulses: 2+ and symmetric Skin: Skin color,  texture, turgor normal. No rashes or lesions Neurologic: Grossly normal  EKG not performed   ASSESSMENT AND PLAN:   1. CAD: s/p PCI  to Lcx with residual CAD as outlined above. Dyspnea improved. No chest pain. Continue DAPT, BB and statin.  We will check a f/u CBC today given DAPT with mild occasional abdominal-mid epigastric pain and h/o anemia and prior PUD.   2. HLD: we will increase Lipitor to 20 mg. Will see how she tolerates higher dose. Recheck FLP and HFTs in 4 weeks. If LDL is not at goal of < 70, we will first try to titrate statin if no side effects +/- addition of Zetia. If unable to tolerate high dose of statin, may also consider fereral to lipid clinic for PCSK9-inhibitors.   3. HTN: BP is elevated in clinic today. We will increase her metoprolol to 25 mg BID. She will check her BP closely at home and will notify us of any persistent systolic BP readings > 798.   She is interested in outpatient cardiac rehab. We will place referral today.   PLAN  F/u with Dr. Ellyn [name] in 6 weeks.   Emiel Kielty PA-C 09/29/2016 1:24 PM

## 2016-09-29 NOTE — Patient Instructions (Addendum)
Medication Instructions:   START TAKING METOPROLOL LOPRESSOR  25 MG TWICE A DAY  START TAKING  LIPITOR  20 MG ONCE A DAY    If you need a refill on your cardiac medications before your next appointment, please call your pharmacy.  Labwork: TODAY CBC  RETURN IN 4 WEEKS FASTING LFT AN D LIPIDS    Testing/Procedures:  NONE ORDERED  TODAY    Follow-Up: You have been referred to Cardiac Rehab someone will  Contact you from the rehabilitation clinic  In 3 months with Dr Ellyn Hack     Any Other Special Instructions Will Be Listed Below (If Applicable).

## 2016-09-30 LAB — CBC
Hematocrit: 31 % — ABNORMAL LOW (ref 34.0–46.6)
Hemoglobin: 9.9 g/dL — ABNORMAL LOW (ref 11.1–15.9)
MCH: 29.8 pg (ref 26.6–33.0)
MCHC: 31.9 g/dL (ref 31.5–35.7)
MCV: 93 fL (ref 79–97)
PLATELETS: 277 10*3/uL (ref 150–379)
RBC: 3.32 x10E6/uL — ABNORMAL LOW (ref 3.77–5.28)
RDW: 15.4 % (ref 12.3–15.4)
WBC: 11.7 10*3/uL — ABNORMAL HIGH (ref 3.4–10.8)

## 2016-10-02 ENCOUNTER — Telehealth (HOSPITAL_COMMUNITY): Payer: Self-pay | Admitting: Family Medicine

## 2016-10-02 NOTE — Telephone Encounter (Signed)
S/w [place] coverage for Cardiac Rehab Program. No Deductible, Out of Pocket $3200.00 pt has met $2355.95 with the balance of $844.05. No limit of sessions with 10% co-insurance.... Reference # [id number].... KJ

## 2016-10-15 ENCOUNTER — Inpatient Hospital Stay (HOSPITAL_COMMUNITY): Admission: RE | Admit: 2016-10-15 | Payer: Medicare Other | Source: Ambulatory Visit

## 2016-10-19 ENCOUNTER — Ambulatory Visit (HOSPITAL_COMMUNITY): Payer: Medicare Other

## 2016-10-21 ENCOUNTER — Ambulatory Visit (HOSPITAL_COMMUNITY): Payer: Medicare Other

## 2016-10-23 ENCOUNTER — Ambulatory Visit (HOSPITAL_COMMUNITY): Payer: Medicare Other

## 2016-10-24 ENCOUNTER — Other Ambulatory Visit: Payer: Self-pay | Admitting: Family Medicine

## 2016-10-26 ENCOUNTER — Ambulatory Visit (HOSPITAL_COMMUNITY): Payer: Medicare Other

## 2016-10-27 ENCOUNTER — Telehealth: Payer: Self-pay | Admitting: Cardiology

## 2016-10-27 ENCOUNTER — Other Ambulatory Visit (INDEPENDENT_AMBULATORY_CARE_PROVIDER_SITE_OTHER): Payer: Medicare Other

## 2016-10-27 DIAGNOSIS — Z5181 Encounter for therapeutic drug level monitoring: Secondary | ICD-10-CM | POA: Diagnosis not present

## 2016-10-27 LAB — HEPATIC FUNCTION PANEL
ALT: 9 IU/L (ref 0–32)
AST: 16 IU/L (ref 0–40)
Albumin: 3.6 g/dL (ref 3.5–4.8)
Alkaline Phosphatase: 47 IU/L (ref 39–117)
BILIRUBIN TOTAL: 0.5 mg/dL (ref 0.0–1.2)
Bilirubin, Direct: 0.18 mg/dL (ref 0.00–0.40)
Total Protein: 6.2 g/dL (ref 6.0–8.5)

## 2016-10-27 LAB — LIPID PANEL
Chol/HDL Ratio: 3.5 ratio units (ref 0.0–4.4)
Cholesterol, Total: 128 mg/dL (ref 100–199)
HDL: 37 mg/dL — ABNORMAL LOW (ref >39)
LDL Calculated: 52 mg/dL (ref 0–99)
Triglycerides: 197 mg/dL — ABNORMAL HIGH (ref 0–149)
VLDL Cholesterol Cal: 39 mg/dL (ref 5–40)

## 2016-10-27 NOTE — Telephone Encounter (Signed)
Faxed to DR [facility] office and called to notify Juliann Pulse this is ok for procedure-also notified that pt is taking Plavix and cannot stop

## 2016-10-27 NOTE — Telephone Encounter (Signed)
I don't think this patient is actually my clinic patient. I did a PCI on her. She does not have a cardiac lesion that would require her to be on an antibiotic for dental procedure.  Glenetta Hew, MD

## 2016-10-27 NOTE — Telephone Encounter (Signed)
New message     What dental office are you calling from? Dr Ennis Forts 1. What is your office phone and fax number?  Fax [phone]  What type of procedure is the patient having performed? Pt is in chair to have a filling if needed What date is procedure scheduled? Pt is in chair 2. What is your question (ex. Antibiotics prior to procedure, holding medication-we need to know how long dentist wants pt to hold med)? Pt already took antibiotic.  Calling to get cardiac clearance since pt has stents

## 2016-10-28 ENCOUNTER — Ambulatory Visit (HOSPITAL_COMMUNITY): Payer: Medicare Other

## 2016-10-30 ENCOUNTER — Ambulatory Visit (HOSPITAL_COMMUNITY): Payer: Medicare Other

## 2016-11-02 ENCOUNTER — Ambulatory Visit (HOSPITAL_COMMUNITY): Payer: Medicare Other

## 2016-11-04 ENCOUNTER — Ambulatory Visit (HOSPITAL_COMMUNITY): Payer: Medicare Other

## 2016-11-06 ENCOUNTER — Ambulatory Visit (HOSPITAL_COMMUNITY): Payer: Medicare Other

## 2016-11-09 ENCOUNTER — Ambulatory Visit (HOSPITAL_COMMUNITY): Payer: Medicare Other

## 2016-11-11 ENCOUNTER — Ambulatory Visit (HOSPITAL_COMMUNITY): Payer: Medicare Other

## 2016-11-13 ENCOUNTER — Ambulatory Visit (HOSPITAL_COMMUNITY): Payer: Medicare Other

## 2016-11-16 ENCOUNTER — Ambulatory Visit (HOSPITAL_COMMUNITY): Payer: Medicare Other

## 2016-11-18 ENCOUNTER — Ambulatory Visit (HOSPITAL_COMMUNITY): Payer: Medicare Other

## 2016-11-20 ENCOUNTER — Ambulatory Visit (HOSPITAL_COMMUNITY): Payer: Medicare Other

## 2016-11-23 ENCOUNTER — Ambulatory Visit (HOSPITAL_COMMUNITY): Payer: Medicare Other

## 2016-11-25 ENCOUNTER — Ambulatory Visit (HOSPITAL_COMMUNITY): Payer: Medicare Other

## 2016-11-27 ENCOUNTER — Ambulatory Visit (HOSPITAL_COMMUNITY): Payer: Medicare Other

## 2016-11-30 ENCOUNTER — Ambulatory Visit (HOSPITAL_COMMUNITY): Payer: Medicare Other

## 2016-12-02 ENCOUNTER — Ambulatory Visit (HOSPITAL_COMMUNITY): Payer: Medicare Other

## 2016-12-04 ENCOUNTER — Ambulatory Visit (HOSPITAL_COMMUNITY): Payer: Medicare Other

## 2016-12-07 ENCOUNTER — Ambulatory Visit (HOSPITAL_COMMUNITY): Payer: Medicare Other

## 2016-12-09 ENCOUNTER — Ambulatory Visit (HOSPITAL_COMMUNITY): Payer: Medicare Other

## 2016-12-11 ENCOUNTER — Ambulatory Visit (HOSPITAL_COMMUNITY): Payer: Medicare Other

## 2016-12-14 ENCOUNTER — Ambulatory Visit (HOSPITAL_COMMUNITY): Payer: Medicare Other

## 2016-12-16 ENCOUNTER — Ambulatory Visit (HOSPITAL_COMMUNITY): Payer: Medicare Other

## 2016-12-16 LAB — HM MAMMOGRAPHY

## 2016-12-18 ENCOUNTER — Ambulatory Visit (HOSPITAL_COMMUNITY): Payer: Medicare Other

## 2016-12-18 ENCOUNTER — Encounter: Payer: Self-pay | Admitting: Family Medicine

## 2016-12-21 ENCOUNTER — Ambulatory Visit (HOSPITAL_COMMUNITY): Payer: Medicare Other

## 2016-12-22 ENCOUNTER — Encounter: Payer: Self-pay | Admitting: *Deleted

## 2016-12-23 ENCOUNTER — Ambulatory Visit (HOSPITAL_COMMUNITY): Payer: Medicare Other

## 2016-12-25 ENCOUNTER — Ambulatory Visit (HOSPITAL_COMMUNITY): Payer: Medicare Other

## 2016-12-28 ENCOUNTER — Ambulatory Visit (HOSPITAL_COMMUNITY): Payer: Medicare Other

## 2016-12-30 ENCOUNTER — Ambulatory Visit (HOSPITAL_COMMUNITY): Payer: Medicare Other

## 2017-01-01 ENCOUNTER — Ambulatory Visit (HOSPITAL_COMMUNITY): Payer: Medicare Other

## 2017-01-04 ENCOUNTER — Ambulatory Visit (HOSPITAL_COMMUNITY): Payer: Medicare Other

## 2017-01-06 ENCOUNTER — Ambulatory Visit (HOSPITAL_COMMUNITY): Payer: Medicare Other

## 2017-01-08 ENCOUNTER — Ambulatory Visit (HOSPITAL_COMMUNITY): Payer: Medicare Other

## 2017-01-11 ENCOUNTER — Ambulatory Visit (HOSPITAL_COMMUNITY): Payer: Medicare Other

## 2017-01-13 ENCOUNTER — Ambulatory Visit (HOSPITAL_COMMUNITY): Payer: Medicare Other

## 2017-01-15 ENCOUNTER — Ambulatory Visit (HOSPITAL_COMMUNITY): Payer: Medicare Other

## 2017-01-18 ENCOUNTER — Ambulatory Visit (HOSPITAL_COMMUNITY): Payer: Medicare Other

## 2017-01-20 ENCOUNTER — Ambulatory Visit (HOSPITAL_COMMUNITY): Payer: Medicare Other

## 2017-01-22 ENCOUNTER — Ambulatory Visit (HOSPITAL_COMMUNITY): Payer: Medicare Other

## 2017-01-25 ENCOUNTER — Ambulatory Visit (HOSPITAL_COMMUNITY): Payer: Medicare Other

## 2017-01-27 ENCOUNTER — Ambulatory Visit: Payer: Medicare Other | Admitting: Cardiology

## 2017-01-27 ENCOUNTER — Encounter: Payer: Self-pay | Admitting: Cardiology

## 2017-01-27 ENCOUNTER — Ambulatory Visit (INDEPENDENT_AMBULATORY_CARE_PROVIDER_SITE_OTHER): Payer: Medicare Other | Admitting: Cardiology

## 2017-01-27 ENCOUNTER — Ambulatory Visit (HOSPITAL_COMMUNITY): Payer: Medicare Other

## 2017-01-27 VITALS — BP 94/48 | HR 72 | Ht 64.0 in | Wt 201.0 lb

## 2017-01-27 DIAGNOSIS — Z9861 Coronary angioplasty status: Secondary | ICD-10-CM

## 2017-01-27 DIAGNOSIS — E669 Obesity, unspecified: Secondary | ICD-10-CM

## 2017-01-27 DIAGNOSIS — I209 Angina pectoris, unspecified: Secondary | ICD-10-CM

## 2017-01-27 DIAGNOSIS — E785 Hyperlipidemia, unspecified: Secondary | ICD-10-CM | POA: Diagnosis not present

## 2017-01-27 DIAGNOSIS — I10 Essential (primary) hypertension: Secondary | ICD-10-CM | POA: Diagnosis not present

## 2017-01-27 DIAGNOSIS — I351 Nonrheumatic aortic (valve) insufficiency: Secondary | ICD-10-CM | POA: Diagnosis not present

## 2017-01-27 DIAGNOSIS — D539 Nutritional anemia, unspecified: Secondary | ICD-10-CM

## 2017-01-27 DIAGNOSIS — E786 Lipoprotein deficiency: Secondary | ICD-10-CM

## 2017-01-27 DIAGNOSIS — R0609 Other forms of dyspnea: Secondary | ICD-10-CM

## 2017-01-27 DIAGNOSIS — I2510 Atherosclerotic heart disease of native coronary artery without angina pectoris: Secondary | ICD-10-CM | POA: Diagnosis not present

## 2017-01-27 MED ORDER — LOSARTAN POTASSIUM-HCTZ 100-25 MG PO TABS: ORAL_TABLET | ORAL | 3 refills | Status: DC

## 2017-01-27 NOTE — Patient Instructions (Addendum)
MEDICATION CHANGE    STOP ASPIRIN  DECREASE TO 1/2 TABLET OF LOSARTAN-HCTZ 100/25 MG - BY MOUTH DAILY    Your physician recommends that you schedule a follow-up appointment in [place] [address] WITH B. Simmons PA    SCHEDULE AT [address] [place] [name] 2019----Your physician has requested that you have an echocardiogram. Echocardiography is a painless test that uses sound waves to create images of your heart. It provides your doctor with information about the size and shape of your heart and how well your heart's chambers and valves are working. This procedure takes approximately one hour. There are no restrictions for this procedure.   Your physician wants you to follow-up in [name] 2019 with Dr Ellyn Hack after echo is completed.You will receive a reminder letter in the mail two months in advance. If you don't receive a letter, please call our office to schedule the follow-up appointment.   If you need a refill on your cardiac medications before your next appointment, please call your pharmacy.

## 2017-01-27 NOTE — Progress Notes (Signed)
PCP: Abner Greenspan, MD  Clinic Note: Chief Complaint  Patient presents with  . Follow-up    Shortness of breath with exertion - notably improved  . Coronary Artery Disease    HPI: [name] [name] is a 76 y.o. female with a PMH below who presents today for four-month/second post hospital follow-up CAD-PCI.  [name] [name] was initially seen by [name] Henri, PA on January 2 with complaints of roughly 3 months of exertional dyspnea concerning for possible angina and LBBB found on EKG . Dr. Curt Bears was the DOD, and discussed the patient with [name] [name]. Prior to that, she had a history of nonobstructive CAD from a cath back in 2001. She had a history of hyperlipidemia, but will not on a statin.  At her new patient evaluation, we ordered a pro-BNP which was WNL. 2D echo showed reduced LVEF at 45-50% (previously 60%). There is also hypokinesis of the anteroseptal and inferoseptal myocardium, grade 1 DD and moderate aortic insufficiency. Her stress test is intermediate risk, with EF estimate of 30-44%, with a small inferior apical and septal wall infarct w/o ischemia. Findings suggest prior MI. Infarct territory correlates to area of WMA noted on echo.  when she was seen back on January 16 for follow-up, she continued to note exertional dyspnea walking short distances. This was concerning for progressive angina in the setting of the abnormal echocardiogram and stress test. Of note, she also had chronic anemia followed by hematology for non-GI bleed related anemia. With these findings, she was referred for cardiac catheterization performed on the 19th.  Recent Hospitalizations: 09/11/2017 - for cardiac catheterization - PCI  Studies Personally Reviewed - (if available, images/films reviewed: From Epic Chart or Care Everywhere)  Cardiac cath 09/11/2016:   Mid Cx lesion, 90 %stenosed.  A STENT SYNERGY DES [id number] drug eluting stent was successfully placed.  Post intervention, there is a 0%  residual stenosis.  _________________________________________  Colon [place] 1st Diag lesion, 70 %stenosed. - Not good PCI/PTCA target  Ost 2nd Diag to 2nd Diag lesion, 40 %stenosed. Ost 3rd Diag to 3rd Diag lesion, 60 %stenosed.  Ost LAD lesion, 50 %stenosed. Dist LAD lesion, 50 %stenosed. - Combined FFR 0.01-7.4  LV end diastolic pressure is mildly elevated.    Successful DES stenting of the most significant culprit lesion in the circumflex without complication.  Diagnostic Diagram  09/11/2016:      Post-Intervention Diagram  Mid Cx lesion, 90 %stenosed     STENT SYNERGY DES 2.75X16 drug eluting stent      Pre-cath echo and Myoview [name] 2018:  Echo 08/27/2016: EF 45-50% - HK of anteroseptal & inferoseptal wall.  Gr 1 DD.  Mod AI.    Myoview 08/28/2016: EF 39%. Small inferior apical and septal wall infarct with no ischemia. Diffuse hypokinesis worse in inferoapex. Consistent with prior myocardial infarction. INTERMEDIATE RISK.  She was seen again by [name] [name] on February 6. She noted doing well with notable improvement of her dyspnea. She does have some mild dyspnea. Much improved exercise tolerance. Stable anemia. No melena with Plavix. ->   Lipitor was increased to 20 mm daily to see how she tolerated. Next Consideration will be Zetia versus PCSK9 inhibitor.  Metoprolol increased to 20 mg twice a day.  Cardiac rehabilitation consult placed  Interval History:  [name] presents today overall feeling quite well. She still has a little bit exertional dyspnea but is only when she is doing "quite a lot. 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 She has had both hip surgery done the replacements, but has had some significant residual pain in left hip and thigh. She has much better exercise tolerance than before. 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 She has noted however feeling a little bit lightheaded and dizzy on occasion often positional/orthostatic.     From a cardiac standpoint, she denies any resting or exertional chest tightness or pressure. Notably improved exercise tolerance with minimal dyspnea on the she overexerts. Minimal edema. No PND, orthopnea. No palpitations, weakness or syncope/near syncope. No TIA/amaurosis fugax symptoms. No claudication.  ROS: A comprehensive was performed.pertinent symptoms noted in history of present illness  Review of Systems  Constitutional: Negative for malaise/fatigue.  HENT: Negative for congestion and nosebleeds.   Respiratory: Positive for shortness of breath. Negative for cough and wheezing.   Gastrointestinal: Negative for blood in stool and melena.  Genitourinary: Negative for hematuria.  Musculoskeletal: Positive for back pain and joint pain. Negative for falls.       Per history of present illness  Neurological: Negative for weakness.  Endo/Heme/Allergies: Bruises/bleeds easily.  Psychiatric/Behavioral: The patient is nervous/anxious (Much better with her GG-Son now more independent.).   All other systems reviewed and are negative.   I have reviewed and (if needed) personally updated the patient's problem list, medications, allergies, past medical and surgical history, social and family history.   Past Medical History:  Diagnosis Date  . Anemia    of chronic disease  . Anxiety   . Arthritis    "psoratic arthritis; new dx" (09/11/2016)  . Chronic lower back pain   . Complication of anesthesia 1980 and 1982   trouble waking up after breast biopsy  . Coronary artery disease   . Depression   . Fibrocystic breast   . Headache    "monthly" (09/11/2016)  . High cholesterol   . History of blood transfusion    "several since age 65; I've had them after most of my surgeries" (09/11/2016)  . Hx of skin cancer, basal  cell 2013 and 2012   right inner, lower leg; several scattered around my body  . Hypertension   . Iron deficiency anemia    "I've had an iron infusion"  . Myocardial infarction [facility])    "doctor said there are signs I've had a heart attack; don't know when" (09/11/2016)  . Osteoarthritis   . Osteopenia   . Peptic ulcer disease    H pylori  . PMR (polymyalgia rheumatica) (HCC)   . Psoriasis     Past Surgical History:  Procedure Laterality Date  . APPENDECTOMY    . BACK SURGERY    . BREAST BIOPSY Left 1980 and 1982   "benign"  . CARDIAC CATHETERIZATION N/A 09/11/2016   Procedure: Left Heart Cath and Coronary Angiography;  Surgeon: Leonie Man, MD;  Location: [place] [facility];  Service: Cardiovascular;  Laterality: N/A;  . CARDIAC CATHETERIZATION N/A 09/11/2016   Procedure: Intravascular Pressure Wire/FFR Study;  Surgeon: Leonie Man, MD;  Location: [place] [facility];  Service: Cardiovascular;  Laterality: N/A;  . CARDIAC CATHETERIZATION N/A 09/11/2016   Procedure: Coronary Stent Intervention;  Surgeon: Leonie Man, MD;  Location: [place] [facility];  Service: Cardiovascular;  Laterality: N/A;  . CARDIAC CATHETERIZATION  2001  .  CATARACT EXTRACTION W/ INTRAOCULAR LENS  IMPLANT, BILATERAL  July -  August 2017  . CORONARY ANGIOPLASTY    . DILATION AND CURETTAGE OF UTERUS  X 3  . ESOPHAGOGASTRODUODENOSCOPY  03/2009   ulcer,HP, GERD stricture  . EXCISIONAL HEMORRHOIDECTOMY    . HIP ARTHROPLASTY Left 2010  . JOINT REPLACEMENT    . KNEE ARTHROSCOPY Bilateral   . LAPAROSCOPIC CHOLECYSTECTOMY  2005  . LUMBAR DISC SURGERY  11/1984; 1987; 1989; 2004; 2005 X 2   deg. disk lumber spine   . MOHS SURGERY Right    "inside my lower leg"  . NASAL SINUS SURGERY  1970s  . NM MYOVIEW LTD  08/28/2016   EF ~35-40%.  ? Prior Small Inferior-apical & septal infarct (w/o ischemia).  Difuse HK - worse in inferoapex  . TOTAL HIP ARTHROPLASTY Right 04/04/2013   Procedure: RIGHT TOTAL HIP  ARTHROPLASTY ANTERIOR APPROACH;  Surgeon: Mauri Pole, MD;  Location: [facility];  Service: Orthopedics;  Laterality: Right;  . TOTAL KNEE ARTHROPLASTY Right 2009  . TRANSTHORACIC ECHOCARDIOGRAM  08/2016   mild conc LVH. EF 45-50% - anteroseptal & inferoseptal HK. Mod AI.  Marland Kitchen TUBAL LIGATION    . VAGINAL HYSTERECTOMY  1970s   partial     Current Meds  Medication Sig  . acetaminophen (TYLENOL) 500 MG tablet Take 1,000 mg by mouth every 6 (six) hours as needed for moderate pain.  Marland Kitchen amoxicillin (AMOXIL) 500 MG capsule Take 2,000 mg by mouth as needed.   Marland Kitchen aspirin 81 MG chewable tablet Chew 1 tablet (81 mg total) by mouth daily.  Marland Kitchen atorvastatin (LIPITOR) 20 MG tablet Take 1 tablet (20 mg total) by mouth daily.  . busPIRone (BUSPAR) 15 MG tablet TAKE 1/2 TO 1 TABLET BY MOUTH TWICE A DAY  . clopidogrel (PLAVIX) 75 MG tablet Take 1 tablet (75 mg total) by mouth daily with breakfast.  . clotrimazole-betamethasone (LOTRISONE) cream APPLY TO AFFECTED AREA TWICE A DAY UNTIL CLEARED (Patient taking differently: APPLY TO AFFECTED AREA TWICE A DAY AS NEEDED IRRITATION)  . folic acid (FOLVITE) 1 MG tablet Take 1 mg by mouth daily.  Marland Kitchen HYDROcodone-acetaminophen (NORCO/VICODIN) 5-325 MG tablet Take 1 tablet by mouth 2 (two) times daily as needed for pain.  . hyoscyamine (LEVSIN, ANASPAZ) 0.125 MG tablet TAKE 1 TABLET BY MOUTH AS NEEDED. (Patient taking differently: TAKE 1 TABLET BY MOUTH AS NEEDED STOMACH SPASMS)  . losartan-hydrochlorothiazide (HYZAAR) 100-25 MG tablet TAKE 1/2 TABLET ( TOTAL 50/12.5 MG) OF DOSE  BY MOUTH DAILY  . Melatonin 3 MG CAPS Take 6 mg by mouth at bedtime.  . methotrexate (50 MG/ML) 1 g injection Inject 0.6 mg into the vein once a week.   . metoprolol tartrate (LOPRESSOR) 25 MG tablet Take 1 tablet (25 mg total) by mouth 2 (two) times daily.  . nitroGLYCERIN (NITROSTAT) 0.4 MG SL tablet Place 1 tablet (0.4 mg total) under the tongue every 5 (five) minutes as needed for chest pain.  Marland Kitchen  oxymetazoline (AFRIN) 0.05 % nasal spray Place 2 sprays into the nose 2 (two) times daily as needed for congestion.  . pantoprazole (PROTONIX) 40 MG tablet Take 1 tablet (40 mg total) by mouth daily.  . predniSONE (DELTASONE) 5 MG tablet Take 5 mg by mouth daily with breakfast.   . raloxifene (EVISTA) 60 MG tablet Take 1 tablet (60 mg total) by mouth daily.  . [DISCONTINUED] losartan-hydrochlorothiazide (HYZAAR) 100-25 MG tablet Take 1 tablet by mouth daily.  - indicates full Medical compliance  Allergies  Allergen Reactions  . Ace Inhibitors     REACTION: cough  . Amoxicillin-Pot Clavulanate     REACTION: gi upset Has patient had a PCN reaction causing immediate rash, facial/tongue/throat swelling, SOB or lightheadedness with hypotension: No Has patient had a PCN reaction causing severe rash involving mucus membranes or skin necrosis: No Has patient had a PCN reaction that required hospitalization No Has patient had a PCN reaction occurring within the last 10 years: No If all of the above answers are "NO", then may proceed with Cephalosporin use.   Marland Kitchen Oxaprozin     REACTION: unknown  . Pregabalin     REACTION: swelling  . Sulfonamide Derivatives     REACTION: UNKNOWN    Social History   Social History  . Marital status: Married    Spouse name: N/A  . Number of children: N/A  . Years of education: N/A   Social History Main Topics  . Smoking status: Never Smoker  . Smokeless tobacco: Never Used  . Alcohol use 0.0 oz/week     Comment: 09/11/2016 "might have a margarita q 3-4 months"  . Drug use: No  . Sexual activity: Not Currently   Other Topics Concern  . None   Social History Narrative  . None    family history includes Arthritis in her mother; Emphysema in her mother; Heart disease in her brother; Hyperlipidemia in her mother; Hypertension in her mother.  Wt Readings from Last 3 Encounters:  01/27/17 201 lb (91.2 kg)  09/29/16 202 lb 1.9 oz (91.7 kg)  09/12/16  207 lb 7.3 oz (94.1 kg)    PHYSICAL EXAM BP (!) 94/48 (BP Location: Right Arm, Patient Position: Sitting, Cuff Size: Normal)   Pulse 72   Ht 5\' 4"  (1.626 m)   Wt 201 lb (91.2 kg)   BMI 34.50 kg/m  General appearance: alert, cooperative, appears stated age, no distress. moderately obese HEENT: [place]/AT, EOMI, MMM, anicteric sclera Neck: no adenopathy, no carotid bruit and no JVD Lungs: clear to auscultation bilaterally, normal percussion bilaterally and non-labored Heart: regular rate and rhythm, S1 &S2 normal, no murmur, click, rub or gallop; Nondisplaced PMI Abdomen: soft, non-tender; bowel sounds normal; no masses,  no organomegaly; no HJR Extremities: extremities normal, atraumatic, no cyanosis, and edema - trace Pulses: 2+ and symmetric;  Skin: Normal skin turgor. No rashes or lesions. Mild bruises throughout. Neurologic: Mental status: Alert & oriented x 3, thought content appropriate; non-focal exam.  Pleasant mood & affect.    Adult ECG Report N/a  Other studies Reviewed: Additional studies/ records that were reviewed today include:  Recent Labs:   CBC Latest Ref Rng & Units 09/29/2016 09/12/2016 09/08/2016  WBC 3.4 - 10.8 x10E3/uL 11.7(H) 8.4 13.2(H)  Hemoglobin 11.1 - 15.9 g/dL 9.9(L) 9.4(L) 10.6(L)  Hematocrit 34.0 - 46.6 % 31.0(L) 30.0(L) 32.3(L)  Platelets 150 - 379 x10E3/uL 277 221 331   Lab Results  Component Value Date   CHOL 128 10/27/2016   HDL 37 (L) 10/27/2016   LDLCALC 52 10/27/2016   LDLDIRECT 107.0 12/10/2015   TRIG 197 (H) 10/27/2016   CHOLHDL 3.5 10/27/2016   ASSESSMENT / PLAN: Problem List Items Addressed This Visit    Angina, class III ([place])    Notably improved symptoms following PCI of circumflex      Relevant Medications   losartan-hydrochlorothiazide (HYZAAR) 100-25 MG tablet   Aortic regurgitation (Chronic)    Barely auscultated on exam. With her blood pressure where it is, probably not  overly concerning. We can follow up with Echo in ~[name]  2019.  Main of treatment here is blood pressure control which is adequate with beta blocker and ARB/HCTZ.      Relevant Medications   losartan-hydrochlorothiazide (HYZAAR) 100-25 MG tablet   CAD S/P percutaneous coronary angioplasty (Chronic)    Notably improved symptoms. No recurrent angina. Exercise tolerance improved. She saw her Plavix, and with her having history of anemia and frequent bruising, we will simply stop aspirin at this time. We will continue Plavix for the rest of the year. If stable at that time would simply continue Plavix alone. She is on low-dose Lopressor as well as ARB and statin.      Relevant Medications   losartan-hydrochlorothiazide (HYZAAR) 100-25 MG tablet   Deficiency anemia   Dyspnea on exertion (Chronic)    Notably improved. She has some moderate disease in other coronary arteries. Probably not symptomatic. Some of her dyspnea on exertion is probably due to deconditioning. I recommended that she continue to walk.      Essential hypertension - Primary (Chronic)    Actually borderline hypotensive at this point. I had backed down on her losartan-HCTZ down to 50/12.5 to avoid hypotension related syncope.      Relevant Medications   losartan-hydrochlorothiazide (HYZAAR) 100-25 MG tablet   Hyperlipidemia with low HDL (Chronic)    At this point now her LDL goal is less than 70 with an HDL goal greater than 40. She is on 20 of atorvastatin which she seems to be tolerating relatively well. Most recent labs were from March showing a calculated LDL of 52 which is well within goal.      Relevant Medications   losartan-hydrochlorothiazide (HYZAAR) 100-25 MG tablet   Obesity (BMI 30.0-34.9) (Chronic)    The patient understands the need to lose weight with diet and exercise. We have discussed specific strategies for this. This is potentially one reason for her exertional dyspnea.         Current medicines are reviewed at length with the patient today. (+/-  concerns) n/a The following changes have been made: - d/c ASA & reduce ARB.   Patient Instructions  MEDICATION CHANGE    STOP ASPIRIN  DECREASE TO 1/2 TABLET OF LOSARTAN-HCTZ 100/25 MG - BY MOUTH DAILY    Your physician recommends that you schedule a follow-up appointment in OCT 2081 WITH B. Simmons PA    SCHEDULE AT [address] [place] [name] 2019----Your physician has requested that you have an echocardiogram. Echocardiography is a painless test that uses sound waves to create images of your heart. It provides your doctor with information about the size and shape of your heart and how well your heart's chambers and valves are working. This procedure takes approximately one hour. There are no restrictions for this procedure.   Your physician wants you to follow-up in [name] 2019 with Dr Ellyn Hack after echo is completed.You will receive a reminder letter in the mail two months in advance. If you don't receive a letter, please call our office to schedule the follow-up appointment.   If you need a refill on your cardiac medications before your next appointment, please call your pharmacy.    Studies Ordered:   No orders of the defined types were placed in this encounter.     Glenetta Hew, M.D., M.S. Interventional Cardiologist   Pager # [phone] Phone # [phone] [address] [place], [place] [address]

## 2017-01-29 ENCOUNTER — Ambulatory Visit (HOSPITAL_COMMUNITY): Payer: Medicare Other

## 2017-01-29 ENCOUNTER — Encounter: Payer: Self-pay | Admitting: Cardiology

## 2017-01-29 NOTE — Assessment & Plan Note (Signed)
Notably improved. She has some moderate disease in other coronary arteries. Probably not symptomatic. Some of her dyspnea on exertion is probably due to deconditioning. I recommended that she continue to walk.

## 2017-01-29 NOTE — Assessment & Plan Note (Signed)
Actually borderline hypotensive at this point. I had backed down on her losartan-HCTZ down to 50/12.5 to avoid hypotension related syncope.

## 2017-01-29 NOTE — Assessment & Plan Note (Signed)
At this point now her LDL goal is less than 70 with an HDL goal greater than 40. She is on 20 of atorvastatin which she seems to be tolerating relatively well. Most recent labs were from March showing a calculated LDL of 52 which is well within goal.

## 2017-01-29 NOTE — Assessment & Plan Note (Signed)
The patient understands the need to lose weight with diet and exercise. We have discussed specific strategies for this. This is potentially one reason for her exertional dyspnea.

## 2017-01-29 NOTE — Assessment & Plan Note (Signed)
Notably improved symptoms following PCI of circumflex

## 2017-01-29 NOTE — Assessment & Plan Note (Signed)
Barely auscultated on exam. With her blood pressure where it is, probably not overly concerning. We can follow up with Echo in ~[name] 2019.  Main of treatment here is blood pressure control which is adequate with beta blocker and ARB/HCTZ.

## 2017-01-29 NOTE — Assessment & Plan Note (Signed)
Notably improved symptoms. No recurrent angina. Exercise tolerance improved. She saw her Plavix, and with her having history of anemia and frequent bruising, we will simply stop aspirin at this time. We will continue Plavix for the rest of the year. If stable at that time would simply continue Plavix alone. She is on low-dose Lopressor as well as ARB and statin.

## 2017-02-01 ENCOUNTER — Ambulatory Visit (HOSPITAL_COMMUNITY): Payer: Medicare Other

## 2017-02-02 ENCOUNTER — Other Ambulatory Visit: Payer: Self-pay | Admitting: Family Medicine

## 2017-02-03 ENCOUNTER — Ambulatory Visit (HOSPITAL_COMMUNITY): Payer: Medicare Other

## 2017-02-04 NOTE — Addendum Note (Signed)
Addended by: Raiford Simmonds on: 02/04/2017 10:28 AM   Modules accepted: Orders

## 2017-02-05 ENCOUNTER — Ambulatory Visit (HOSPITAL_COMMUNITY): Payer: Medicare Other

## 2017-02-08 ENCOUNTER — Ambulatory Visit (HOSPITAL_COMMUNITY): Payer: Medicare Other

## 2017-02-10 ENCOUNTER — Ambulatory Visit (HOSPITAL_COMMUNITY): Payer: Medicare Other

## 2017-02-12 ENCOUNTER — Ambulatory Visit (HOSPITAL_COMMUNITY): Payer: Medicare Other

## 2017-02-15 ENCOUNTER — Telehealth: Payer: Self-pay | Admitting: Cardiology

## 2017-02-15 ENCOUNTER — Ambulatory Visit (HOSPITAL_COMMUNITY): Payer: Medicare Other

## 2017-02-15 MED ORDER — LOSARTAN POTASSIUM-HCTZ 50-12.5 MG PO TABS: 1.0000 | ORAL_TABLET | Freq: Every day | ORAL | 3 refills | Status: DC

## 2017-02-15 NOTE — Telephone Encounter (Signed)
Patient calling,states that Dr. Ellyn Hack instructed her to cut her Losartan medication in 1/2. Patient states that the "little tablets dont cut in half very well and I have just a mess." Patient would like to know if Dr. Ellyn Hack could write a new prescription for the lower dosage, thanks.

## 2017-02-15 NOTE — Telephone Encounter (Signed)
Patient called stating that she would like to have a new precription for her Losartan Hydrochlorothiazide 100-25 mg daily called in and changed to her new dose of 50-12.5 mg. She stated that it is too hard for her to split the pill in half. The new order has been called in for her. Patient verbalized her appreciation.

## 2017-02-17 ENCOUNTER — Other Ambulatory Visit (HOSPITAL_COMMUNITY): Payer: Medicare Other

## 2017-03-06 ENCOUNTER — Other Ambulatory Visit: Payer: Self-pay | Admitting: Family Medicine

## 2017-03-07 ENCOUNTER — Other Ambulatory Visit: Payer: Self-pay | Admitting: Cardiology

## 2017-03-08 MED ORDER — HYOSCYAMINE SULFATE 0.125 MG PO TABS: ORAL_TABLET | ORAL | 5 refills | Status: DC

## 2017-03-08 MED ORDER — CLOTRIMAZOLE-BETAMETHASONE 1-0.05 % EX CREA: TOPICAL_CREAM | CUTANEOUS | 5 refills | Status: DC

## 2017-03-08 NOTE — Telephone Encounter (Signed)
Please refill all times 5

## 2017-03-08 NOTE — Telephone Encounter (Signed)
REFILL 

## 2017-03-08 NOTE — Telephone Encounter (Addendum)
Pt had CPE on 08/19/17, lotrisone cream last filled on 06/22/16 #30g with 1 additional refill, please advise

## 2017-04-12 ENCOUNTER — Other Ambulatory Visit: Payer: Self-pay | Admitting: Cardiology

## 2017-04-12 NOTE — Telephone Encounter (Signed)
Rx(s) sent to pharmacy electronically.  

## 2017-04-30 ENCOUNTER — Telehealth: Payer: Self-pay | Admitting: Cardiology

## 2017-04-30 NOTE — Telephone Encounter (Signed)
Pt wants to know when is her Echo supposed to be scheduled? She said at her last office visit  Dr Ellyn Hack said he wanted her to have an Echo.

## 2017-04-30 NOTE — Telephone Encounter (Signed)
Spoke to patient.  Patient states someone called her saying she needed echo scheduled now.   She doesn't think she needs an echo this soon.    Per last OV note: patient needs f/u echo [name] 2019.    Echo cancelled and message sent to scheduler to reschedule for correct date.  Patient is aware.   Patient has f/u in October with PA as recommended.

## 2017-05-11 ENCOUNTER — Other Ambulatory Visit (HOSPITAL_COMMUNITY): Payer: Medicare Other

## 2017-05-21 ENCOUNTER — Encounter (INDEPENDENT_AMBULATORY_CARE_PROVIDER_SITE_OTHER): Payer: Self-pay

## 2017-05-21 ENCOUNTER — Ambulatory Visit (INDEPENDENT_AMBULATORY_CARE_PROVIDER_SITE_OTHER): Payer: Medicare Other | Admitting: Cardiology

## 2017-05-21 ENCOUNTER — Encounter: Payer: Self-pay | Admitting: Cardiology

## 2017-05-21 VITALS — BP 130/64 | HR 74 | Ht 64.0 in | Wt 204.8 lb

## 2017-05-21 DIAGNOSIS — I2510 Atherosclerotic heart disease of native coronary artery without angina pectoris: Secondary | ICD-10-CM | POA: Diagnosis not present

## 2017-05-21 NOTE — Patient Instructions (Addendum)
Medication Instructions:   HERE ARE SOME RECCOMENDATION FOR MEDICATION   BLOOD FLOW - IMDUR ([place])  [place]     If you need a refill on your cardiac medications before your next appointment, please call your pharmacy.  Labwork:NONE ORDERED  TODAY   Testing/Procedures: NONE ORDERED  TODAY    Follow-Up: AS  SCHEDULED WITH  DR [facility] IN january   Any Other Special Instructions Will Be Listed Below (If Applicable).

## 2017-05-21 NOTE — Progress Notes (Signed)
05/21/2017 [name] [name]   07/21/1941  [id number]  Primary Physician Tower, [name], MD Primary Cardiologist: Dr. Ellyn Hack   Reason for Visit/CC: f/u for CAD  HPI:  [name] presents to clinic today for routien f/u for CAD.   She first presented to our practice 08/25/16 as new patient after being referred by her PCP, Dr. Glori Bickers, for cardiac examination given new development of exertional dyspnea. A pro- BNP was checked and was normal. 2D echo showed  reduced LVEF at 45-50% (previously 60%) with hypokinesis of the anteroseptal and inferoseptal myocardium, grade 1 DD and moderate aortic insufficiency. She underwent ischemic w/u and her stress test was ruled intermediate risk, with EF estimate of 30-44%, with a small inferior apical and septal wall infarct w/o ischemia. Findings suggested prior MI. Infarct territory correlated to area of WMA noted on echo.   Subsequently, she was referred for definitive LHC. This was performed by Dr. Ellyn Hack at [facility] on 09/11/16. She was found to have a 90% mid Cx lesion, treated with PCI utilizing a DES. There was also a 70% 1st diagonal lesion, that was not a good PCI/PTCA target, as well as 40% 2nd diag, 60% 3rd diag and a 50% ostial LAD lesion and 50% distal LAD lesion that was negative by FFR testing. Medical therapy was recommended for her residual disease. She was placed on DAPT with ASA + Plavix and continued on a beta blocker and statin.   She also has chronic anemia, followed by hematology with baseline Hgb in the 8-10 range, rheumatoid arthritis and aortic insuffiencey. She was last seen by Dr. Ellyn Hack 01/2017. He stopped her ASA given her anemia and frequent spontaneous bruising. She has continued on Plavix. He also reduced the dose of her losartan-HCTz given soft BP.   She presents back for f/u. She denies CP but notes chronic exertional dyspnea. She does not know how much of this is related to her chronic anemia. She denies resting dyspnea but still gets  short winded if she has to walk several feet. She is ok with ADLs. She noted some improvement right after her stent was placed in January. She reports full med compliance. She complaints of multi joint pain, which she thinks is also statin related, on top of RA.   Current Meds  Medication Sig  . acetaminophen (TYLENOL) 500 MG tablet Take 1,000 mg by mouth every 6 (six) hours as needed for moderate pain.  Marland Kitchen amoxicillin (AMOXIL) 500 MG capsule Take 2,000 mg by mouth as needed.   Marland Kitchen atorvastatin (LIPITOR) 20 MG tablet TAKE 1 TABLET BY MOUTH EVERY DAY  . busPIRone (BUSPAR) 15 MG tablet TAKE 1/2 TO 1 TABLET BY MOUTH TWICE A DAY  . clopidogrel (PLAVIX) 75 MG tablet Take 1 tablet (75 mg total) by mouth daily with breakfast.  . clotrimazole-betamethasone (LOTRISONE) cream APPLY TO AFFECTED AREA TWICE A DAY UNTIL CLEARED  . folic acid (FOLVITE) 1 MG tablet Take 1 mg by mouth daily.  Marland Kitchen HYDROcodone-acetaminophen (NORCO/VICODIN) 5-325 MG tablet Take 1 tablet by mouth 2 (two) times daily as needed for pain.  . hyoscyamine (LEVSIN, ANASPAZ) 0.125 MG tablet TAKE 1 TABLET BY MOUTH AS NEEDED STOMACH SPASMS  . losartan-hydrochlorothiazide (HYZAAR) 50-12.5 MG tablet Take 1 tablet by mouth daily.  . Melatonin 3 MG CAPS Take 6 mg by mouth at bedtime.  . methotrexate (50 MG/ML) 1 g injection Inject 0.8 mg into the vein once a week.   . metoprolol tartrate (LOPRESSOR) 25 MG tablet Take  1 tablet (25 mg total) by mouth 2 (two) times daily.  Marland Kitchen oxymetazoline (AFRIN) 0.05 % nasal spray Place 2 sprays into the nose 2 (two) times daily as needed for congestion.  . pantoprazole (PROTONIX) 40 MG tablet Take 1 tablet (40 mg total) by mouth daily.  . predniSONE (DELTASONE) 5 MG tablet Take 5 mg by mouth daily with breakfast.   . raloxifene (EVISTA) 60 MG tablet Take 1 tablet (60 mg total) by mouth daily.   Allergies  Allergen Reactions  . Ace Inhibitors     REACTION: cough  . Amoxicillin-Pot Clavulanate     REACTION: gi  upset Has patient had a PCN reaction causing immediate rash, facial/tongue/throat swelling, SOB or lightheadedness with hypotension: No Has patient had a PCN reaction causing severe rash involving mucus membranes or skin necrosis: No Has patient had a PCN reaction that required hospitalization No Has patient had a PCN reaction occurring within the last 10 years: No If all of the above answers are "NO", then may proceed with Cephalosporin use.   Marland Kitchen Oxaprozin     REACTION: unknown  . Pregabalin     REACTION: swelling  . Sulfonamide Derivatives     REACTION: UNKNOWN   Past Medical History:  Diagnosis Date  . Anemia    of chronic disease  . Anxiety   . Arthritis    "psoratic arthritis; new dx" (09/11/2016)  . Chronic lower back pain   . Complication of anesthesia 1980 and 1982   trouble waking up after breast biopsy  . Coronary artery disease   . Depression   . Fibrocystic breast   . Headache    "monthly" (09/11/2016)  . High cholesterol   . History of blood transfusion    "several since age 4; I've had them after most of my surgeries" (09/11/2016)  . Hx of skin cancer, basal cell 2013 and 2012   right inner, lower leg; several scattered around my body  . Hypertension   . Iron deficiency anemia    "I've had an iron infusion"  . Myocardial infarction [facility])    "doctor said there are signs I've had a heart attack; don't know when" (09/11/2016)  . Osteoarthritis   . Osteopenia   . Peptic ulcer disease    H pylori  . PMR (polymyalgia rheumatica) (HCC)   . Psoriasis    Family History  Problem Relation Age of Onset  . Arthritis Mother   . Emphysema Mother   . Hyperlipidemia Mother   . Hypertension Mother   . Heart disease Brother        CAD   Past Surgical History:  Procedure Laterality Date  . APPENDECTOMY    . BACK SURGERY    . BREAST BIOPSY Left 1980 and 1982   "benign"  . CARDIAC CATHETERIZATION N/A 09/11/2016   Procedure: Left Heart Cath and Coronary Angiography;   Surgeon: Leonie Man, MD;  Location: [place];  Service: Cardiovascular;  Laterality: N/A;  . CARDIAC CATHETERIZATION N/A 09/11/2016   Procedure: Intravascular Pressure Wire/FFR Study;  Surgeon: Leonie Man, MD;  Location: [place] [facility];  Service: Cardiovascular;  Laterality: N/A;  . CARDIAC CATHETERIZATION N/A 09/11/2016   Procedure: Coronary Stent Intervention;  Surgeon: Leonie Man, MD;  Location: [place] [facility];  Service: Cardiovascular;  Laterality: N/A;  . CARDIAC CATHETERIZATION  2001  . CATARACT EXTRACTION W/ INTRAOCULAR LENS  IMPLANT, BILATERAL  July -  August 2017  . CORONARY ANGIOPLASTY    .  DILATION AND CURETTAGE OF UTERUS  X 3  . ESOPHAGOGASTRODUODENOSCOPY  03/2009   ulcer,HP, GERD stricture  . EXCISIONAL HEMORRHOIDECTOMY    . HIP ARTHROPLASTY Left 2010  . JOINT REPLACEMENT    . KNEE ARTHROSCOPY Bilateral   . LAPAROSCOPIC CHOLECYSTECTOMY  2005  . LUMBAR DISC SURGERY  11/1984; 1987; 1989; 2004; 2005 X 2   deg. disk lumber spine   . MOHS SURGERY Right    "inside my lower leg"  . NASAL SINUS SURGERY  1970s  . NM MYOVIEW LTD  08/28/2016   EF ~35-40%.  ? Prior Small Inferior-apical & septal infarct (w/o ischemia).  Difuse HK - worse in inferoapex  . TOTAL HIP ARTHROPLASTY Right 04/04/2013   Procedure: RIGHT TOTAL HIP ARTHROPLASTY ANTERIOR APPROACH;  Surgeon: Mauri Pole, MD;  Location: [facility];  Service: Orthopedics;  Laterality: Right;  . TOTAL KNEE ARTHROPLASTY Right 2009  . TRANSTHORACIC ECHOCARDIOGRAM  08/2016   mild conc LVH. EF 45-50% - anteroseptal & inferoseptal HK. Mod AI.  Marland Kitchen TUBAL LIGATION    . VAGINAL HYSTERECTOMY  1970s   partial    Social History   Social History  . Marital status: Married    Spouse name: N/A  . Number of children: N/A  . Years of education: N/A   Occupational History  . Not on file.   Social History Main Topics  . Smoking status: Never Smoker  . Smokeless tobacco: Never Used  . Alcohol use 0.0 oz/week      Comment: 09/11/2016 "might have a margarita q 3-4 months"  . Drug use: No  . Sexual activity: Not Currently   Other Topics Concern  . Not on file   Social History Narrative  . No narrative on file     Review of Systems: General: negative for chills, fever, night sweats or weight changes.  Cardiovascular: negative for chest pain, dyspnea on exertion, edema, orthopnea, palpitations, paroxysmal nocturnal dyspnea or shortness of breath Dermatological: negative for rash Respiratory: negative for cough or wheezing Urologic: negative for hematuria Abdominal: negative for nausea, vomiting, diarrhea, bright red blood per rectum, melena, or hematemesis Neurologic: negative for visual changes, syncope, or dizziness All other systems reviewed and are otherwise negative except as noted above.   Physical Exam:  Blood pressure 130/64, pulse 74, height 5\' 4"  (1.626 m), weight 204 lb 12.8 oz (92.9 kg), SpO2 98 %.  General appearance: alert, cooperative and no distress Neck: no carotid bruit and no JVD Lungs: clear to auscultation bilaterally Heart: regular rate and rhythm, S1, S2 normal, no murmur, click, rub or gallop Extremities: extremities normal, atraumatic, no cyanosis or edema Pulses: 2+ and symmetric Skin: Skin color, texture, turgor normal. No rashes or lesions Neurologic: Grossly normal  EKG not peformed -- personally reviewed   ASSESSMENT AND PLAN:   1. CAD: s/p PCI to the mid LCx 08/2016. There was also a 70% 1st diagonal lesion, that was not a good PCI/PTCA target, as well as 40% 2nd diag, 60% 3rd diag and a 50% ostial LAD lesion and 50% distal LAD lesion that was negative by FFR testing. Medical therapy was recommended for her residual disease. Continue Plavix. ASA was discontinued recently by Dr. Ellyn Hack given chronic anemia. Continue BB and statin therapy. She has some residual exertional dyspnea but no chest pain. ? How much of this is related to her chronic anemia with baseline  Hgb in the 9 range. I recommended consideration of Imdur given her known residual coronary disease to be treated  medically, however pt is resistant to starting new medications. Pt wants to consider and will f/u if symptoms worsen.   2. HTN: controlled on current regimen.   3. HLD:  LDL is controlled on low dose statin therapy, Lipitor 20 mg. However she complains of chronic multi joint pain. ? How much of this is related to RA vs some statin effects. We discussed consideration of PCSK9 inhibitor therapy but pt would prefer just to continue Lipitor for now. She will notify us if she reconsiders.   4. Aortic Insuffiencey: scheduled for f/u echo in [name]. 2019. Was noted to be mild at time of last assessment.   5. Chronic anemia: followed by hematology. Doing ok on Plavix. Pt notes most recent CBC showed Hgb ~9, which is her baseline.   7. RA: followed by rheumatology.    Follow-Up: keep appt for f/u echo and clinic f/u with Dr. Ellyn Hack in January.   Brittainy Ladoris Gene, MHS [facility] 05/21/2017 1:33 PM

## 2017-07-01 ENCOUNTER — Other Ambulatory Visit: Payer: Self-pay | Admitting: Physician Assistant

## 2017-07-16 ENCOUNTER — Other Ambulatory Visit: Payer: Self-pay | Admitting: Cardiology

## 2017-07-27 ENCOUNTER — Other Ambulatory Visit: Payer: Self-pay | Admitting: Family Medicine

## 2017-07-28 NOTE — Telephone Encounter (Signed)
Will refill electronically  

## 2017-07-28 NOTE — Telephone Encounter (Signed)
Last OV here was 08/19/16 and no future appts., last filled on 03/08/17 #30g with 5 additional refills, please advise

## 2017-08-12 ENCOUNTER — Other Ambulatory Visit: Payer: Self-pay

## 2017-08-12 MED ORDER — LOSARTAN POTASSIUM-HCTZ 50-12.5 MG PO TABS: 1.0000 | ORAL_TABLET | Freq: Every day | ORAL | 2 refills | Status: DC

## 2017-08-12 NOTE — Telephone Encounter (Signed)
Rx(s) sent to pharmacy electronically.  

## 2017-08-24 HISTORY — PX: TRANSTHORACIC ECHOCARDIOGRAM: SHX275

## 2017-08-30 ENCOUNTER — Other Ambulatory Visit: Payer: Self-pay | Admitting: Family Medicine

## 2017-08-30 NOTE — Telephone Encounter (Signed)
Please schedule f/u or PE and refill until then 

## 2017-08-30 NOTE — Telephone Encounter (Signed)
Pt hasn't been seen in over a year and no future appts., please advise  

## 2017-09-01 NOTE — Telephone Encounter (Signed)
appt scheduled and med refilled 

## 2017-09-07 ENCOUNTER — Other Ambulatory Visit: Payer: Self-pay

## 2017-09-07 ENCOUNTER — Ambulatory Visit (HOSPITAL_COMMUNITY): Payer: Medicare Other | Attending: Cardiology

## 2017-09-07 DIAGNOSIS — I5030 Unspecified diastolic (congestive) heart failure: Secondary | ICD-10-CM | POA: Diagnosis not present

## 2017-09-07 DIAGNOSIS — I061 Rheumatic aortic insufficiency: Secondary | ICD-10-CM | POA: Diagnosis not present

## 2017-09-07 DIAGNOSIS — R0609 Other forms of dyspnea: Secondary | ICD-10-CM | POA: Diagnosis not present

## 2017-09-07 DIAGNOSIS — Z9861 Coronary angioplasty status: Secondary | ICD-10-CM

## 2017-09-07 DIAGNOSIS — I351 Nonrheumatic aortic (valve) insufficiency: Secondary | ICD-10-CM | POA: Diagnosis present

## 2017-09-07 DIAGNOSIS — I2510 Atherosclerotic heart disease of native coronary artery without angina pectoris: Secondary | ICD-10-CM | POA: Diagnosis present

## 2017-09-15 ENCOUNTER — Ambulatory Visit: Payer: Medicare Other | Admitting: Cardiology

## 2017-10-01 ENCOUNTER — Encounter: Payer: Self-pay | Admitting: Cardiology

## 2017-10-01 ENCOUNTER — Ambulatory Visit: Payer: Medicare Other | Admitting: Cardiology

## 2017-10-01 VITALS — BP 122/70 | HR 88 | Ht 65.0 in | Wt 202.6 lb

## 2017-10-01 DIAGNOSIS — I2510 Atherosclerotic heart disease of native coronary artery without angina pectoris: Secondary | ICD-10-CM

## 2017-10-01 DIAGNOSIS — I10 Essential (primary) hypertension: Secondary | ICD-10-CM

## 2017-10-01 DIAGNOSIS — Z9861 Coronary angioplasty status: Secondary | ICD-10-CM | POA: Diagnosis not present

## 2017-10-01 DIAGNOSIS — I351 Nonrheumatic aortic (valve) insufficiency: Secondary | ICD-10-CM

## 2017-10-01 DIAGNOSIS — E786 Lipoprotein deficiency: Secondary | ICD-10-CM

## 2017-10-01 DIAGNOSIS — R0609 Other forms of dyspnea: Secondary | ICD-10-CM | POA: Diagnosis not present

## 2017-10-01 DIAGNOSIS — I447 Left bundle-branch block, unspecified: Secondary | ICD-10-CM

## 2017-10-01 DIAGNOSIS — Z01810 Encounter for preprocedural cardiovascular examination: Secondary | ICD-10-CM | POA: Diagnosis not present

## 2017-10-01 DIAGNOSIS — E785 Hyperlipidemia, unspecified: Secondary | ICD-10-CM | POA: Diagnosis not present

## 2017-10-01 DIAGNOSIS — I209 Angina pectoris, unspecified: Secondary | ICD-10-CM

## 2017-10-01 MED ORDER — ISOSORBIDE MONONITRATE ER 30 MG PO TB24: 30.0000 mg | ORAL_TABLET | Freq: Every day | ORAL | 3 refills | Status: DC

## 2017-10-01 NOTE — Progress Notes (Signed)
PCP: Abner Greenspan, MD  Clinic Note: Chief Complaint  Patient presents with  . Echo f/u visit  . Coronary Artery Disease  . Shortness of Breath    HPI: [name] [name] is a 77 y.o. female with a PMH below who presents today for four-month follow-up for recent CAD-PCI evaluation this past summer.  She was initially seen by Ellen Henri, PA-C back in January 2018 -> noted several months of exertional dyspnea concerning for possible angina. Noted to have LBBB. -- Echo & Myoview -->  2-D echo showed EF 45-50% (down from 60%. Anterior septal, anteroseptal hypokinesis with GI 1 DD and moderate aortic insufficiency.  Nuclear stress test was intermediate risk with EF 30-40% and small inferior apical and septal wall infarct with ischemia. Suggest prior infarct with no ischemia. Ischemic area correlates with echo WMA  Cardiac cath 09/11/2016: Mid LCx 90% --> DES PCI  Ostial D1 70% (not PTCA target). Moderate ostial and mD2.  LAD tandem 50% stenoses w/ negative FFR (0.86).  Seen post cath 01/27/2017: Doing well. Still noted exertional dyspnea but doing overall better. Bothered by arthritis.   [name] [name] was last seen on 05/21/2017 by Ellen Henri, PA. --> She denied any chest tightness or pressure but had continued chronic exertional dyspnea. No resting dyspnea but gets winded walking several feet. She did get some improvement immediately at the stent but not dramatic. -- Declined Imdur; prefers staying on statin as opposed to considering PCSK9 inhibitor despite complaints of multiple joint pain  Recent Hospitalizations: None  Studies Personally Reviewed - (if available, images/films reviewed: From Epic Chart or Care Everywhere)  2D Echo 09/07/2017: Normal LV systolic function; mild diastolic dysfunction; mild   LVH; sclerotic aortic valve with mild AI.  Interval History: [name] [name] returns today pretty stable. We began the visit by me telling the results of her nuclear stress  test, and then see her simply reporting with the question "then while still so short of breath ?"   She still says that she gets winded if she walks more than 100 feet. She can get short of breath just simply walking across the floor of her house. But this has not changed since before the stent. What may be concerns or more now is that she has significant left shoulder pain having shoulder surgery for the last cardiac evaluation. She is now a year out and is considering the possibility of rethinking surgery.  She tends to think that her dyspnea may very well be related to her anemia - I had stopped her aspirin at my last visit.  She denies any resting dyspnea, and has not had any resting or exertional chest tightness/pressure.   No PND, orthopnea or edema. No palpitations, lightheadedness, dizziness, weakness or syncope/near syncope. No TIA/amaurosis fugax symptoms. No melena, hematochezia, hematuria, or epstaxis. No claudication.  ROS: A comprehensive was performed. Review of Systems  Constitutional: Negative for malaise/fatigue (Just when she gets tired).  HENT: Negative for congestion and nosebleeds.   Respiratory: Positive for shortness of breath. Negative for cough.   Cardiovascular:       Per history of present illness  Gastrointestinal: Negative for abdominal pain, blood in stool and melena.  Genitourinary: Negative for hematuria.  Musculoskeletal: Positive for joint pain (Diffuse).  Neurological: Positive for dizziness (Occasional, positional). Negative for focal weakness and weakness.  Endo/Heme/Allergies: Negative for environmental allergies. Bruises/bleeds easily.  Psychiatric/Behavioral: Negative for depression and memory loss. The patient is not nervous/anxious.   All other systems  reviewed and are negative.  I have reviewed and (if needed) personally updated the patient's problem list, medications, allergies, past medical and surgical history, social and family history.    Past Medical History:  Diagnosis Date  . Anemia    of chronic disease  . Anxiety   . Arthritis    "psoratic arthritis; new dx" (09/11/2016)  . CAD S/P percutaneous coronary angioplasty 08/2017   mLCx 90% -> 0% (2.75 mm x 16 mm) Synergy DES PCI Ostial D1 70% (not PTCA target). Moderate ostial and mD2.  LAD tandem 50% stenoses w/ negative FFR (0.86)  . Chronic lower back pain   . Complication of anesthesia 1980 and 1982   trouble waking up after breast biopsy  . Depression   . Fibrocystic breast   . Headache    "monthly" (09/11/2016)  . High cholesterol   . History of blood transfusion    "several since age 22; I've had them after most of my surgeries" (09/11/2016)  . Hx of skin cancer, basal cell 2013 and 2012   right inner, lower leg; several scattered around my body  . Hypertension   . Iron deficiency anemia    "I've had an iron infusion"  . Myocardial infarction [facility])    "doctor said there are signs I've had a heart attack; don't know when" (09/11/2016)  . Osteoarthritis   . Osteopenia   . Peptic ulcer disease    H pylori  . PMR (polymyalgia rheumatica) (HCC)   . Psoriasis     Past Surgical History:  Procedure Laterality Date  . APPENDECTOMY    . BACK SURGERY    . BREAST BIOPSY Left 1980 and 1982   "benign"  . CARDIAC CATHETERIZATION N/A 09/11/2016   Procedure: Left Heart Cath and Coronary Angiography;  Surgeon: Leonie Man, MD;  Location: [facility];  Service: Cardiovascular: mLCx 90% * (PCI). Ostial D1 70% (not PTCA target). Mod ostial and mD2.  LAD tandem 50% stenoses w/ negative FFR (0.86)  . CARDIAC CATHETERIZATION N/A 09/11/2016   Procedure: Intravascular Pressure Wire/FFR Study;  Surgeon: Leonie Man, MD;  Location: [facility];  Service: Cardiovascular: --  LAD tandem 50% stenoses w/ negative FFR (0.86  . CARDIAC CATHETERIZATION N/A 09/11/2016   Procedure: Coronary Stent Intervention;  Surgeon: Leonie Man, MD;  Location: [facility];  Service: Cardiovascular: -- DES PCI mLCx 90%--0% SYNERGY DES 2.75 MM X 16 MM.  Marland Kitchen CARDIAC CATHETERIZATION  2001  . CATARACT EXTRACTION W/ INTRAOCULAR LENS  IMPLANT, BILATERAL  July -  August 2017  . DILATION AND CURETTAGE OF UTERUS  X 3  . ESOPHAGOGASTRODUODENOSCOPY  03/2009   ulcer,HP, GERD stricture  . EXCISIONAL HEMORRHOIDECTOMY    . HIP ARTHROPLASTY Left 2010  . JOINT REPLACEMENT    . KNEE ARTHROSCOPY Bilateral   . LAPAROSCOPIC CHOLECYSTECTOMY  2005  . LUMBAR DISC SURGERY  11/1984; 1987; 1989; 2004; 2005 X 2   deg. disk lumber spine   . MOHS SURGERY Right    "inside my lower leg"  . NASAL SINUS SURGERY  1970s  . NM MYOVIEW LTD  08/28/2016   EF ~35-40%.  ? Prior Small Inferior-apical & septal infarct (w/o ischemia).  Difuse HK - worse in inferoapex  . NM MYOVIEW LTD  08/2016   intermediate risk with EF 30-40% and small inferior apical and septal wall infarct with ischemia. Suggest prior infarct with no ischemia. Ischemic area correlates with echo WMA  . TOTAL  HIP ARTHROPLASTY Right 04/04/2013   Procedure: RIGHT TOTAL HIP ARTHROPLASTY ANTERIOR APPROACH;  Surgeon: Mauri Pole, MD;  Location: [facility];  Service: Orthopedics;  Laterality: Right;  . TOTAL KNEE ARTHROPLASTY Right 2009  . TRANSTHORACIC ECHOCARDIOGRAM  08/2016   mild conc LVH. EF 45-50% - anteroseptal & inferoseptal HK. Mod AI.  Marland Kitchen TRANSTHORACIC ECHOCARDIOGRAM  08/2017   (post PCI) --> EF normalized:  Normal LV systolic function; mild diastolic dysfunction; mild   LVH; sclerotic aortic valve with mild AI.  Marland Kitchen TUBAL LIGATION    . VAGINAL HYSTERECTOMY  1970s   partial     Diagnostic Diagram  09/11/2016:                                                     Post-Intervention Diagram  Mid Cx lesion, 90 %stenosed                                                 STENT SYNERGY DES 2.75X16 drug eluting stent                                 Current Meds  Medication Sig  . acetaminophen (TYLENOL) 500 MG tablet Take 1,000 mg  by mouth every 6 (six) hours as needed for moderate pain.  Marland Kitchen amoxicillin (AMOXIL) 500 MG capsule Take 2,000 mg by mouth as needed.   Marland Kitchen atorvastatin (LIPITOR) 20 MG tablet TAKE 1 TABLET BY MOUTH EVERY DAY  . busPIRone (BUSPAR) 15 MG tablet TAKE 1/2 TO 1 TABLET BY MOUTH TWICE A DAY  . clopidogrel (PLAVIX) 75 MG tablet TAKE 1 TABLET (75 MG TOTAL) BY MOUTH DAILY WITH BREAKFAST.  . clotrimazole-betamethasone (LOTRISONE) cream APPLY TO AFFECTED AREA TWICE A DAY UNTIL CLEAR  . folic acid (FOLVITE) 1 MG tablet Take 1 mg by mouth daily.  Marland Kitchen HYDROcodone-acetaminophen (NORCO/VICODIN) 5-325 MG tablet Take 1 tablet by mouth 2 (two) times daily as needed for pain.  . hyoscyamine (LEVSIN, ANASPAZ) 0.125 MG tablet TAKE 1 TABLET BY MOUTH AS NEEDED STOMACH SPASMS  . losartan-hydrochlorothiazide (HYZAAR) 50-12.5 MG tablet Take 1 tablet by mouth daily.  . Melatonin 3 MG CAPS Take 6 mg by mouth at bedtime.  . methotrexate (50 MG/ML) 1 g injection Inject 0.8 mg into the vein once a week.   Marland Kitchen oxymetazoline (AFRIN) 0.05 % nasal spray Place 2 sprays into the nose 2 (two) times daily as needed for congestion.  . pantoprazole (PROTONIX) 40 MG tablet TAKE 1 TABLET (40 MG TOTAL) BY MOUTH DAILY.  Marland Kitchen predniSONE (DELTASONE) 5 MG tablet Take 5 mg by mouth daily with breakfast.   . raloxifene (EVISTA) 60 MG tablet TAKE 1 TABLET (60 MG TOTAL) BY MOUTH DAILY.    Allergies  Allergen Reactions  . Ace Inhibitors     REACTION: cough  . Amoxicillin-Pot Clavulanate     REACTION: gi upset Has patient had a PCN reaction causing immediate rash, facial/tongue/throat swelling, SOB or lightheadedness with hypotension: No Has patient had a PCN reaction causing severe rash involving mucus membranes or skin necrosis: No Has patient had a PCN reaction that required hospitalization No Has patient  had a PCN reaction occurring within the last 10 years: No If all of the above answers are "NO", then may proceed with Cephalosporin use.   Marland Kitchen  Oxaprozin     REACTION: unknown  . Pregabalin     REACTION: swelling  . Sulfonamide Derivatives     REACTION: UNKNOWN    Social History   Tobacco Use  . Smoking status: Never Smoker  . Smokeless tobacco: Never Used  Substance Use Topics  . Alcohol use: Yes    Alcohol/week: 0.0 oz    Comment: 09/11/2016 "might have a margarita q 3-4 months"  . Drug use: No   Social History   Social History Narrative  . Not on file    family history includes Arthritis in her mother; Emphysema in her mother; Heart disease in her brother; Hyperlipidemia in her mother; Hypertension in her mother.  Wt Readings from Last 3 Encounters:  10/01/17 202 lb 9.6 oz (91.9 kg)  05/21/17 204 lb 12.8 oz (92.9 kg)  01/27/17 201 lb (91.2 kg)    PHYSICAL EXAM BP 122/70 (BP Location: Left Arm, Patient Position: Sitting, Cuff Size: Large)   Pulse 88   Ht 5\' 5"  (1.651 m)   Wt 202 lb 9.6 oz (91.9 kg)   BMI 33.71 kg/m  Physical Exam  Constitutional: She is oriented to person, place, and time. She appears well-developed and well-nourished. No distress.  HENT:  Head: Normocephalic and atraumatic.  Mouth/Throat: No oropharyngeal exudate.  Eyes: No scleral icterus.  Neck: Normal range of motion. Neck supple. No hepatojugular reflux and no JVD present. Carotid bruit is not present.  Cardiovascular: Normal rate, regular rhythm and intact distal pulses. PMI is not displaced. Exam reveals no gallop and no friction rub.  Murmur heard.  Harsh crescendo-decrescendo early systolic murmur is present with a grade of 1/6 at the upper right sternal border radiating to the neck. Pulmonary/Chest: Effort normal and breath sounds normal. No respiratory distress. She has no rales.  Abdominal: Soft. Bowel sounds are normal. She exhibits no distension. There is no tenderness.  Musculoskeletal: Normal range of motion. She exhibits edema (Trivial).  Significant left shoulder pain with internal and external rotation and  abduction/abduction  Neurological: She is alert and oriented to person, place, and time.  Psychiatric: She has a normal mood and affect. Her behavior is normal. Judgment and thought content normal.  Nursing note and vitals reviewed.    Adult ECG Report n/a  Other studies Reviewed: Additional studies/ records that were reviewed today include:  Recent Labs:   Lab Results  Component Value Date   CREATININE 1.11 (H) 09/12/2016   BUN 17 09/12/2016   NA 139 09/12/2016   K 3.6 09/12/2016   CL 107 09/12/2016   CO2 25 09/12/2016   Lab Results  Component Value Date   CHOL 128 10/27/2016   HDL 37 (L) 10/27/2016   LDLCALC 52 10/27/2016   LDLDIRECT 107.0 12/10/2015   TRIG 197 (H) 10/27/2016   CHOLHDL 3.5 10/27/2016    ASSESSMENT / PLAN: Problem List Items Addressed This Visit    Angina, class III ([place])    Really atypical anginal symptoms if anything. She has not had any further chest pain or pressure, but continues to have exertional dyspnea. It's hard to say that the exertional dyspnea is related to cardiac issues. The LAD FFR was negative and is unlikely to have been worse. I don't think is been an acute change note enough to warrant reassessing the circumflex stent.  She could have microvascular disease, and may just simply be symptomatic because her anemia with her CAD. Plan: Add Imdur. Continue metoprolol at current dose, but low threshold to increase if necessary.      Relevant Medications   isosorbide mononitrate (IMDUR) 30 MG 24 hr tablet   Aortic regurgitation (Chronic)    Not auscultated on exam very much. Mild sclerosis with mild regurgitation noted. Continue to monitor every couple years unless he worsens on exam.      Relevant Medications   isosorbide mononitrate (IMDUR) 30 MG 24 hr tablet   CAD S/P percutaneous coronary angioplasty - Primary (Chronic)    Really hard to tell what her true symptoms were. She did have a significant circumduction lesion and other  lesions that were moderate. Now 1 year out: Can stop aspirin. Continue beta blocker and ARB along with statin. Continue off of aspirin.      Relevant Medications   isosorbide mononitrate (IMDUR) 30 MG 24 hr tablet   Dyspnea on exertion (Chronic)    Persistent symptoms. I'm a little bit concerned because she said that her symptoms have definitely improved in June, however now she is saying that she doesn't note is much benefit. Low threshold to consider stress testing. But for now we'll add Imdur. I think this is probably at least in some part related to anemia along with existing CAD related to demand ischemia Symptoms.  I also think that a lot of her dyspnea is secondary to deconditioning and obesity. --> Needs to be less sedentary      Essential hypertension (Chronic)    Blood pressure well controlled on current medications. No change      Relevant Medications   isosorbide mononitrate (IMDUR) 30 MG 24 hr tablet   Hyperlipidemia with low HDL (Chronic)    LDL level is good. Continue current dose atorvastatin.      Relevant Medications   isosorbide mononitrate (IMDUR) 30 MG 24 hr tablet   Left bundle branch block (LBBB) on electrocardiogram (Chronic)    Usually LBBB would not go along with circumflex lesions, more likely to be anterior LAD lesions.  No longer new finding.      Preop cardiovascular exam    She is 1 year out from rest rosacea and with no true anginal symptoms. She still has dyspnea however. Echo looks good. Now off of aspirin daily, but is on clopidogrel.  Planned surgery would be left shoulder rotator cuff surgery. The only concerning feature is her continued dyspnea with exertion. He does seem to be euvolemic.  As she is now had an echo that essentially normal, and no real changes symptoms since last cath, and I'll see any reason to consider ischemic evaluation are to her having shoulder surgery. My recommendation would be to go forth shoulder surgery, if  necessary she can hold Plavix 5-7 days preoperatively restart when safe post op. - This is likely resistant needs to be taken since she is quite symptomatic with this shoulder.        Complex medical decision making with preoperative cardiac risk assessment and multiple ongoing medical conditions.   PREOPERATIVE CARDIAC RISK ASSESSMENT   Revised Cardiac Risk Index:  High Risk Surgery: no; LOW RISK  Defined as Intraperitoneal, intrathoracic or suprainguinal vascular  Active CAD: Probably not 0 no real change in Sx since PCI.;   CHF: no; echo just done - normal   Cerebrovascular Disease: no;   Diabetes: no; On Insulin: - no  CKD (Cr >~  2): no;   Total: Essentially a score of 0 Estimated Risk of Adverse Outcome: Simply Because of Exertional Dyspnea, Would Be Low to Intermediate Risk  Estimated Risk of MI, PE, VF/VT (Echo EF) %   ACC/AHA Guidelines for "Clearance":   Step 1 - Need for Emergency Surgery: No: elective  If Yes - go straight to OR with perioperative surveillance  Step 2 - Active Cardiac Conditions (Unstable Angina, Decompensated HF, Significant  Arrhytmias - Complete HB, Mobitz II, Symptomatic VT or SVT, Severe Aortic Stenosis - mean gradient > 40 mmHg, Valve area < 1.0 cm2):   No: Stable DOE - normal ECho.  If Yes - Evaluate & Treat per ACC/AHA Guidelines  Step 3 -  Low Risk Surgery: Yes  If Yes --> proceed to OR  If No --> Step 4  Step 4 - Functional Capacity >= 4 METS without symptoms: No: dyspnea   If Yes --> proceed to OR  If No --> Step 5  Step 5 --  Clinical Risk Factors (CRF)   3 or more: Yes  If Yes -- assess Surgical Risk, -- no  (High Risk Non-cardiac), Intraabdominal or thoracic vascular surgery consider testing if it will change management.  Intermediate Risk: Proceed to OR with HR control, or consider testing if it will change management  1-2 or more CRFs: Yes  If Yes -- assess Surgical Risk, --   (High Risk Non-cardiac),  Intraabdominal or thoracic vascular surgery --> Proceed to OR, or consider testing if it will change management.  Intermediate Risk: Proceed to OR with HR control, or consider testing if it will change management   Current medicines are reviewed at length with the patient today. (+/- concerns) n/a The following changes have been made: add imdur  Patient Instructions  MEDICATIONS  ISOSORBIDE MONO 30 MG DAILY     CARDIAC CLEARANCE FOR SHOULDER SURGERY IF YOU DECIDE TO HAVE IT DONE . NEED TO STOP PLAVIX  5 DAYS PRIOR TO SURGERY.    Your physician recommends that you schedule a follow-up appointment in 3 - [place].     Studies Ordered:   No orders of the defined types were placed in this encounter.     Glenetta Hew, M.D., M.S. Interventional Cardiologist   Pager # [phone] Phone # [phone] [address] [place], [place] [address]   Thank you for choosing [facility] at [facility]!!

## 2017-10-01 NOTE — Patient Instructions (Addendum)
MEDICATIONS  ISOSORBIDE MONO 30 MG DAILY     CARDIAC CLEARANCE FOR SHOULDER SURGERY IF YOU DECIDE TO HAVE IT DONE . NEED TO STOP PLAVIX  5 DAYS PRIOR TO SURGERY.    Your physician recommends that you schedule a follow-up appointment in 3 - [place].

## 2017-10-06 ENCOUNTER — Telehealth: Payer: Self-pay | Admitting: Family Medicine

## 2017-10-06 ENCOUNTER — Encounter: Payer: Self-pay | Admitting: Cardiology

## 2017-10-06 DIAGNOSIS — E785 Hyperlipidemia, unspecified: Secondary | ICD-10-CM

## 2017-10-06 DIAGNOSIS — T380X5A Adverse effect of glucocorticoids and synthetic analogues, initial encounter: Secondary | ICD-10-CM

## 2017-10-06 DIAGNOSIS — R739 Hyperglycemia, unspecified: Secondary | ICD-10-CM

## 2017-10-06 DIAGNOSIS — E559 Vitamin D deficiency, unspecified: Secondary | ICD-10-CM

## 2017-10-06 DIAGNOSIS — D638 Anemia in other chronic diseases classified elsewhere: Secondary | ICD-10-CM

## 2017-10-06 DIAGNOSIS — I10 Essential (primary) hypertension: Secondary | ICD-10-CM

## 2017-10-06 DIAGNOSIS — E786 Lipoprotein deficiency: Secondary | ICD-10-CM

## 2017-10-06 DIAGNOSIS — M353 Polymyalgia rheumatica: Secondary | ICD-10-CM

## 2017-10-06 NOTE — Assessment & Plan Note (Signed)
Not auscultated on exam very much. Mild sclerosis with mild regurgitation noted. Continue to monitor every couple years unless he worsens on exam.

## 2017-10-06 NOTE — Assessment & Plan Note (Signed)
Really atypical anginal symptoms if anything. She has not had any further chest pain or pressure, but continues to have exertional dyspnea. It's hard to say that the exertional dyspnea is related to cardiac issues. The LAD FFR was negative and is unlikely to have been worse. I don't think is been an acute change note enough to warrant reassessing the circumflex stent.  She could have microvascular disease, and may just simply be symptomatic because her anemia with her CAD. Plan: Add Imdur. Continue metoprolol at current dose, but low threshold to increase if necessary.

## 2017-10-06 NOTE — Assessment & Plan Note (Signed)
Really hard to tell what her true symptoms were. She did have a significant circumduction lesion and other lesions that were moderate. Now 1 year out: Can stop aspirin. Continue beta blocker and ARB along with statin. Continue off of aspirin.

## 2017-10-06 NOTE — Assessment & Plan Note (Signed)
Persistent symptoms. I'm a little bit concerned because she said that her symptoms have definitely improved in June, however now she is saying that she doesn't note is much benefit. Low threshold to consider stress testing. But for now we'll add Imdur. I think this is probably at least in some part related to anemia along with existing CAD related to demand ischemia Symptoms.  I also think that a lot of her dyspnea is secondary to deconditioning and obesity. --> Needs to be less sedentary

## 2017-10-06 NOTE — Assessment & Plan Note (Signed)
Usually LBBB would not go along with circumflex lesions, more likely to be anterior LAD lesions.  No longer new finding.

## 2017-10-06 NOTE — Assessment & Plan Note (Signed)
LDL level is good. Continue current dose atorvastatin.

## 2017-10-06 NOTE — Telephone Encounter (Signed)
-----   Message from Eustace Pen, LPN sent at [phone]/[phone]  4:06 PM EST ----- Regarding: Labs 2/18 Lab orders needed. Thank you.  Insurance:  [facility] [employer]

## 2017-10-06 NOTE — Assessment & Plan Note (Signed)
She is 1 year out from rest rosacea and with no true anginal symptoms. She still has dyspnea however. Echo looks good. Now off of aspirin daily, but is on clopidogrel.  Planned surgery would be left shoulder rotator cuff surgery. The only concerning feature is her continued dyspnea with exertion. He does seem to be euvolemic.  As she is now had an echo that essentially normal, and no real changes symptoms since last cath, and I'll see any reason to consider ischemic evaluation are to her having shoulder surgery. My recommendation would be to go forth shoulder surgery, if necessary she can hold Plavix 5-7 days preoperatively restart when safe post op. - This is likely resistant needs to be taken since she is quite symptomatic with this shoulder.

## 2017-10-06 NOTE — Assessment & Plan Note (Signed)
Blood pressure well controlled on current medications. No change

## 2017-10-08 ENCOUNTER — Other Ambulatory Visit: Payer: Self-pay | Admitting: *Deleted

## 2017-10-08 MED ORDER — METOPROLOL TARTRATE 25 MG PO TABS: 25.0000 mg | ORAL_TABLET | Freq: Two times a day (BID) | ORAL | 3 refills | Status: DC

## 2017-10-12 ENCOUNTER — Ambulatory Visit (INDEPENDENT_AMBULATORY_CARE_PROVIDER_SITE_OTHER): Payer: Medicare Other

## 2017-10-12 VITALS — BP 118/74 | HR 75 | Temp 97.9°F | Ht 64.5 in | Wt 203.0 lb

## 2017-10-12 DIAGNOSIS — D638 Anemia in other chronic diseases classified elsewhere: Secondary | ICD-10-CM

## 2017-10-12 DIAGNOSIS — T380X5A Adverse effect of glucocorticoids and synthetic analogues, initial encounter: Secondary | ICD-10-CM | POA: Diagnosis not present

## 2017-10-12 DIAGNOSIS — E785 Hyperlipidemia, unspecified: Secondary | ICD-10-CM

## 2017-10-12 DIAGNOSIS — E786 Lipoprotein deficiency: Secondary | ICD-10-CM

## 2017-10-12 DIAGNOSIS — I10 Essential (primary) hypertension: Secondary | ICD-10-CM

## 2017-10-12 DIAGNOSIS — R739 Hyperglycemia, unspecified: Secondary | ICD-10-CM

## 2017-10-12 DIAGNOSIS — Z0000 Encounter for general adult medical examination without abnormal findings: Secondary | ICD-10-CM

## 2017-10-12 DIAGNOSIS — E559 Vitamin D deficiency, unspecified: Secondary | ICD-10-CM

## 2017-10-12 DIAGNOSIS — Z23 Encounter for immunization: Secondary | ICD-10-CM | POA: Diagnosis not present

## 2017-10-12 LAB — LIPID PANEL
CHOL/HDL RATIO: 3
CHOLESTEROL: 151 mg/dL (ref 0–200)
HDL: 44 mg/dL (ref >39.00)
NonHDL: 106.52
TRIGLYCERIDES: 205 mg/dL — AB (ref 0.0–149.0)
VLDL: 41 mg/dL — AB (ref 0.0–40.0)

## 2017-10-12 LAB — COMPREHENSIVE METABOLIC PANEL
ALBUMIN: 3.5 g/dL (ref 3.5–5.2)
ALK PHOS: 43 U/L (ref 39–117)
ALT: 14 U/L (ref 0–35)
AST: 19 U/L (ref 0–37)
BUN: 18 mg/dL (ref 6–23)
CALCIUM: 9.2 mg/dL (ref 8.4–10.5)
CHLORIDE: 104 meq/L (ref 96–112)
CO2: 29 mEq/L (ref 19–32)
CREATININE: 1.02 mg/dL (ref 0.40–1.20)
GFR: 55.69 mL/min — ABNORMAL LOW (ref >60.00)
Glucose, Bld: 92 mg/dL (ref 70–99)
Potassium: 3.9 mEq/L (ref 3.5–5.1)
Sodium: 139 mEq/L (ref 135–145)
TOTAL PROTEIN: 6.4 g/dL (ref 6.0–8.3)
Total Bilirubin: 0.5 mg/dL (ref 0.2–1.2)

## 2017-10-12 LAB — CBC WITH DIFFERENTIAL/PLATELET
BASOS ABS: 0 10*3/uL (ref 0.0–0.1)
BASOS PCT: 0.9 % (ref 0.0–3.0)
EOS ABS: 0.1 10*3/uL (ref 0.0–0.7)
Eosinophils Relative: 1.1 % (ref 0.0–5.0)
HEMATOCRIT: 31.6 % — AB (ref 36.0–46.0)
HEMOGLOBIN: 10.2 g/dL — AB (ref 12.0–15.0)
LYMPHS PCT: 35 % (ref 12.0–46.0)
Lymphs Abs: 1.9 10*3/uL (ref 0.7–4.0)
MCHC: 32.2 g/dL (ref 30.0–36.0)
MCV: 94.3 fl (ref 78.0–100.0)
MONO ABS: 0.2 10*3/uL (ref 0.1–1.0)
Monocytes Relative: 4.4 % (ref 3.0–12.0)
Neutro Abs: 3.3 10*3/uL (ref 1.4–7.7)
Neutrophils Relative %: 58.6 % (ref 43.0–77.0)
Platelets: 243 10*3/uL (ref 150.0–400.0)
RBC: 3.35 Mil/uL — AB (ref 3.87–5.11)
RDW: 14.7 % (ref 11.5–15.5)
WBC: 5.5 10*3/uL (ref 4.0–10.5)

## 2017-10-12 LAB — LDL CHOLESTEROL, DIRECT: LDL DIRECT: 74 mg/dL

## 2017-10-12 LAB — TSH: TSH: 3.09 u[IU]/mL (ref 0.35–4.50)

## 2017-10-12 LAB — HEMOGLOBIN A1C: Hgb A1c MFr Bld: 5.8 % (ref 4.6–6.5)

## 2017-10-12 LAB — VITAMIN D 25 HYDROXY (VIT D DEFICIENCY, FRACTURES): VITD: 12.27 ng/mL — AB (ref 30.00–100.00)

## 2017-10-12 NOTE — Patient Instructions (Signed)
[name] [name] , Thank you for taking time to come for your Medicare Wellness Visit. I appreciate your ongoing commitment to your health goals. Please review the following plan we discussed and let me know if I can assist you in the future.   These are the goals we discussed: Goals    . Follow up with Primary Care Provider     Starting 10/12/2017, I will continue to take medications as prescribed and to keep appointments with PCP as scheduled.        This is a list of the screening recommended for you and due dates:  Health Maintenance  Topic Date Due  . Mammogram  12/16/2017  . Tetanus Vaccine  01/25/2018  . Flu Shot  Completed  . DEXA scan (bone density measurement)  Completed  . Pneumonia vaccines  Completed   Preventive Care for Adults  A healthy lifestyle and preventive care can promote health and wellness. Preventive health guidelines for adults include the following key practices.  . A routine yearly physical is a good way to check with your health care provider about your health and preventive screening. It is a chance to share any concerns and updates on your health and to receive a thorough exam.  . Visit your dentist for a routine exam and preventive care every 6 months. Brush your teeth twice a day and floss once a day. Good oral hygiene prevents tooth decay and gum disease.  . The frequency of eye exams is based on your age, health, family medical history, use  of contact lenses, and other factors. Follow your health care provider's recommendations for frequency of eye exams.  . Eat a healthy diet. Foods like vegetables, fruits, whole grains, low-fat dairy products, and lean protein foods contain the nutrients you need without too many calories. Decrease your intake of foods high in solid fats, added sugars, and salt. Eat the right amount of calories for you. Get information about a proper diet from your health care provider, if necessary.  . Regular physical exercise is one  of the most important things you can do for your health. Most adults should get at least 150 minutes of moderate-intensity exercise (any activity that increases your heart rate and causes you to sweat) each week. In addition, most adults need muscle-strengthening exercises on 2 or more days a week.  Silver Sneakers may be a benefit available to you. To determine eligibility, you may visit the website: www.silversneakers.com or contact program at 574-146-7450 Mon-Fri between 8AM-8PM.   . Maintain a healthy weight. The body mass index (BMI) is a screening tool to identify possible weight problems. It provides an estimate of body fat based on height and weight. Your health care provider can find your BMI and can help you achieve or maintain a healthy weight.   For adults 20 years and older: ? A BMI below 18.5 is considered underweight. ? A BMI of 18.5 to 24.9 is normal. ? A BMI of 25 to 29.9 is considered overweight. ? A BMI of 30 and above is considered obese.   . Maintain normal blood lipids and cholesterol levels by exercising and minimizing your intake of saturated fat. Eat a balanced diet with plenty of fruit and vegetables. Blood tests for lipids and cholesterol should begin at age 51 and be repeated every 5 years. If your lipid or cholesterol levels are high, you are over 50, or you are at high risk for heart disease, you may need your cholesterol levels checked  more frequently. Ongoing high lipid and cholesterol levels should be treated with medicines if diet and exercise are not working.  . If you smoke, find out from your health care provider how to quit. If you do not use tobacco, please do not start.  . If you choose to drink alcohol, please do not consume more than 2 drinks per day. One drink is considered to be 12 ounces (355 mL) of beer, 5 ounces (148 mL) of wine, or 1.5 ounces (44 mL) of liquor.  . If you are 96-51 years old, ask your health care provider if you should take aspirin  to prevent strokes.  . Use sunscreen. Apply sunscreen liberally and repeatedly throughout the day. You should seek shade when your shadow is shorter than you. Protect yourself by wearing long sleeves, pants, a wide-brimmed hat, and sunglasses year round, whenever you are outdoors.  . Once a month, do a whole body skin exam, using a mirror to look at the skin on your back. Tell your health care provider of new moles, moles that have irregular borders, moles that are larger than a pencil eraser, or moles that have changed in shape or color.

## 2017-10-12 NOTE — Progress Notes (Signed)
PCP notes:   Health maintenance:  Flu vaccine - administered PCV13 - administered  Abnormal screenings:   Hearing - failed  Hearing Screening   125Hz  250Hz  500Hz  1000Hz  2000Hz  3000Hz  4000Hz  6000Hz  8000Hz   Right ear:   40 40 40  0    Left ear:   40 40 40  0     Depression score: 7 Depression screen [facility] 2/9 10/12/2017 08/11/2016 07/17/2013  Decreased Interest 0 0 0  Down, Depressed, Hopeless 0 1 0  PHQ - 2 Score 0 1 0  Altered sleeping 2 - -  Tired, decreased energy 2 - -  Change in appetite 2 - -  Feeling bad or failure about yourself  0 - -  Trouble concentrating 1 - -  Moving slowly or fidgety/restless 0 - -  Suicidal thoughts 0 - -  PHQ-9 Score 7 - -  Difficult doing work/chores Somewhat difficult - -   Patient concerns:   Urinary and fecal incontinence - patient wears pad daily; states PCP may not be aware  Nurse concerns:  None  Next PCP appt:   10/13/17 @ 1030  I reviewed health advisor's note, was available for consultation, and agree with documentation and plan. [name] MD

## 2017-10-12 NOTE — Progress Notes (Signed)
Subjective:   [name] [name] is a 77 y.o. female who presents for Medicare Annual (Subsequent) preventive examination.  Review of Systems:  N/A Cardiac Risk Factors include: advanced age (>70men, >76 women);obesity (BMI >30kg/m2);dyslipidemia;hypertension     Objective:     Vitals: BP 118/74 (BP Location: Right Arm, Patient Position: Sitting, Cuff Size: Normal)   Pulse 75   Temp 97.9 F (36.6 C) (Oral)   Ht 5' 4.5" (1.638 m) Comment: no shoes  Wt 203 lb (92.1 kg)   SpO2 93%   BMI 34.31 kg/m   Body mass index is 34.31 kg/m.  Advanced Directives 10/12/2017 09/11/2016 09/11/2016 08/11/2016 08/30/2015 12/10/2014 04/04/2013  Does Patient Have a Medical Advance Directive? Yes Yes Yes Yes Yes Yes Patient has advance directive, copy not in chart  Type of Advance Directive [place];Living will Healthcare Power of [place];Living will - Advance instruction for mental health treatment [place];Living will  Does patient want to make changes to medical advance directive? - No - Patient declined - - - - -  Copy of [place] in Chart? Yes No - copy requested No - copy requested No - copy requested No - copy requested No - copy requested Copy requested from family  Pre-existing out of facility DNR order (yellow form or pink MOST form) - - - - - - No    Tobacco Social History   Tobacco Use  Smoking Status Never Smoker  Smokeless Tobacco Never Used     Counseling given: No   Clinical Intake:  Pre-visit preparation completed: Yes  Pain Score: 6      Nutritional Status: BMI > 30  Obese Nutritional Risks: None Diabetes: No  How often do you need to have someone help you when you read instructions, pamphlets, or other written materials from your doctor or pharmacy?: 1 - Never What is the last grade level you completed in school?: 12th grade  Interpreter Needed?:  No  Comments: pt lives with spouse Information entered by :: LPinson, LPN  Past Medical History:  Diagnosis Date  . Anemia    of chronic disease  . Anxiety   . Arthritis    "psoratic arthritis; new dx" (09/11/2016)  . CAD S/P percutaneous coronary angioplasty 08/2017   mLCx 90% -> 0% (2.75 mm x 16 mm) Synergy DES PCI Ostial D1 70% (not PTCA target). Moderate ostial and mD2.  LAD tandem 50% stenoses w/ negative FFR (0.86)  . Chronic lower back pain   . Complication of anesthesia 1980 and 1982   trouble waking up after breast biopsy  . Depression   . Fibrocystic breast   . Headache    "monthly" (09/11/2016)  . High cholesterol   . History of blood transfusion    "several since age 20; I've had them after most of my surgeries" (09/11/2016)  . Hx of skin cancer, basal cell 2013 and 2012   right inner, lower leg; several scattered around my body  . Hypertension   . Iron deficiency anemia    "I've had an iron infusion"  . Myocardial infarction [facility])    "doctor said there are signs I've had a heart attack; don't know when" (09/11/2016)  . Osteoarthritis   . Osteopenia   . Peptic ulcer disease    H pylori  . PMR (polymyalgia rheumatica) (HCC)   . Psoriasis    Past Surgical History:  Procedure Laterality Date  .  APPENDECTOMY    . BACK SURGERY    . BREAST BIOPSY Left 1980 and 1982   "benign"  . CARDIAC CATHETERIZATION N/A 09/11/2016   Procedure: Left Heart Cath and Coronary Angiography;  Surgeon: Leonie Man, MD;  Location: [place] [facility];  Service: Cardiovascular: mLCx 90% * (PCI). Ostial D1 70% (not PTCA target). Mod ostial and mD2.  LAD tandem 50% stenoses w/ negative FFR (0.86)  . CARDIAC CATHETERIZATION N/A 09/11/2016   Procedure: Intravascular Pressure Wire/FFR Study;  Surgeon: Leonie Man, MD;  Location: [place] [facility];  Service: Cardiovascular: --  LAD tandem 50% stenoses w/ negative FFR (0.86  . CARDIAC CATHETERIZATION N/A 09/11/2016   Procedure: Coronary  Stent Intervention;  Surgeon: Leonie Man, MD;  Location: [place] [facility];  Service: Cardiovascular: -- DES PCI mLCx 90%--0% SYNERGY DES 2.75 MM X 16 MM.  Marland Kitchen CARDIAC CATHETERIZATION  2001  . CATARACT EXTRACTION W/ INTRAOCULAR LENS  IMPLANT, BILATERAL  July -  August 2017  . DILATION AND CURETTAGE OF UTERUS  X 3  . ESOPHAGOGASTRODUODENOSCOPY  03/2009   ulcer,HP, GERD stricture  . EXCISIONAL HEMORRHOIDECTOMY    . HIP ARTHROPLASTY Left 2010  . JOINT REPLACEMENT    . KNEE ARTHROSCOPY Bilateral   . LAPAROSCOPIC CHOLECYSTECTOMY  2005  . LUMBAR DISC SURGERY  11/1984; 1987; 1989; 2004; 2005 X 2   deg. disk lumber spine   . MOHS SURGERY Right    "inside my lower leg"  . NASAL SINUS SURGERY  1970s  . NM MYOVIEW LTD  08/28/2016   EF ~35-40%.  ? Prior Small Inferior-apical & septal infarct (w/o ischemia).  Difuse HK - worse in inferoapex  . NM MYOVIEW LTD  08/2016   intermediate risk with EF 30-40% and small inferior apical and septal wall infarct with ischemia. Suggest prior infarct with no ischemia. Ischemic area correlates with echo WMA  . TOTAL HIP ARTHROPLASTY Right 04/04/2013   Procedure: RIGHT TOTAL HIP ARTHROPLASTY ANTERIOR APPROACH;  Surgeon: Mauri Pole, MD;  Location: [facility];  Service: Orthopedics;  Laterality: Right;  . TOTAL KNEE ARTHROPLASTY Right 2009  . TRANSTHORACIC ECHOCARDIOGRAM  08/2016   mild conc LVH. EF 45-50% - anteroseptal & inferoseptal HK. Mod AI.  Marland Kitchen TRANSTHORACIC ECHOCARDIOGRAM  08/2017   (post PCI) --> EF normalized:  Normal LV systolic function; mild diastolic dysfunction; mild   LVH; sclerotic aortic valve with mild AI.  Marland Kitchen TUBAL LIGATION    . VAGINAL HYSTERECTOMY  1970s   partial    Family History  Problem Relation Age of Onset  . Arthritis Mother   . Emphysema Mother   . Hyperlipidemia Mother   . Hypertension Mother   . Heart disease Brother        CAD   Social History   Socioeconomic History  . Marital status: Married    Spouse name: None  .  Number of children: None  . Years of education: None  . Highest education level: None  Social Needs  . Financial resource strain: None  . Food insecurity - worry: None  . Food insecurity - inability: None  . Transportation needs - medical: None  . Transportation needs - non-medical: None  Occupational History  . None  Tobacco Use  . Smoking status: Never Smoker  . Smokeless tobacco: Never Used  Substance and Sexual Activity  . Alcohol use: Yes    Alcohol/week: 0.0 oz    Comment: 09/11/2016 "might have a margarita q 3-4 months"  .  Drug use: No  . Sexual activity: Not Currently  Other Topics Concern  . None  Social History Narrative  . None    Outpatient Encounter Medications as of 10/12/2017  Medication Sig  . acetaminophen (TYLENOL) 500 MG tablet Take 1,000 mg by mouth every 6 (six) hours as needed for moderate pain.  Marland Kitchen amoxicillin (AMOXIL) 500 MG capsule Take 2,000 mg by mouth as needed.   Marland Kitchen atorvastatin (LIPITOR) 20 MG tablet TAKE 1 TABLET BY MOUTH EVERY DAY  . busPIRone (BUSPAR) 15 MG tablet TAKE 1/2 TO 1 TABLET BY MOUTH TWICE A DAY  . clopidogrel (PLAVIX) 75 MG tablet TAKE 1 TABLET (75 MG TOTAL) BY MOUTH DAILY WITH BREAKFAST.  . clotrimazole-betamethasone (LOTRISONE) cream APPLY TO AFFECTED AREA TWICE A DAY UNTIL CLEAR  . folic acid (FOLVITE) 1 MG tablet Take 1 mg by mouth daily.  Marland Kitchen HYDROcodone-acetaminophen (NORCO/VICODIN) 5-325 MG tablet Take 1 tablet by mouth 2 (two) times daily as needed for pain.  . hyoscyamine (LEVSIN, ANASPAZ) 0.125 MG tablet TAKE 1 TABLET BY MOUTH AS NEEDED STOMACH SPASMS  . isosorbide mononitrate (IMDUR) 30 MG 24 hr tablet Take 1 tablet (30 mg total) by mouth daily.  Marland Kitchen losartan-hydrochlorothiazide (HYZAAR) 50-12.5 MG tablet Take 1 tablet by mouth daily.  . Melatonin 3 MG CAPS Take 6 mg by mouth at bedtime.  . methotrexate (50 MG/ML) 1 g injection Inject 0.8 mg into the vein once a week.   . metoprolol tartrate (LOPRESSOR) 25 MG tablet Take 1 tablet  (25 mg total) by mouth 2 (two) times daily.  Marland Kitchen oxymetazoline (AFRIN) 0.05 % nasal spray Place 2 sprays into the nose 2 (two) times daily as needed for congestion.  . pantoprazole (PROTONIX) 40 MG tablet TAKE 1 TABLET (40 MG TOTAL) BY MOUTH DAILY.  Marland Kitchen predniSONE (DELTASONE) 5 MG tablet Take 5 mg by mouth daily with breakfast.   . raloxifene (EVISTA) 60 MG tablet TAKE 1 TABLET (60 MG TOTAL) BY MOUTH DAILY.  . nitroGLYCERIN (NITROSTAT) 0.4 MG SL tablet Place 1 tablet (0.4 mg total) under the tongue every 5 (five) minutes as needed for chest pain.   No facility-administered encounter medications on file as of 10/12/2017.     Activities of Daily Living In your present state of health, do you have any difficulty performing the following activities: 10/12/2017  Hearing? N  Vision? N  Difficulty concentrating or making decisions? N  Walking or climbing stairs? Y  Dressing or bathing? N  Doing errands, shopping? N  Preparing Food and eating ? N  Using the Toilet? N  In the past six months, have you accidently leaked urine? Y  Comment wears brief daily  Do you have problems with loss of bowel control? Y  Managing your Medications? N  Managing your Finances? N  Housekeeping or managing your Housekeeping? N  Some recent data might be hidden    Patient Care Team: Tower, [name], MD as PCP - Geoffry Paradise, MD as Consulting Physician (Physical Medicine and Rehabilitation) Gavin Pound, MD as Consulting Physician (Rheumatology) [place], Jennefer Bravo, MD as Referring Physician (Dermatology) Nicholas Lose, MD as Consulting Physician (Hematology and Oncology)    Assessment:   This is a routine wellness examination for [place].  Exercise Activities and Dietary recommendations Current Exercise Habits: The patient does not participate in regular exercise at present, Exercise limited by: None identified  Goals    . Follow up with Primary Care Provider     Starting 10/12/2017, I will  continue to take medications as prescribed and to keep appointments with PCP as scheduled.        Fall Risk Fall Risk  10/12/2017 08/11/2016 07/17/2013  Falls in the past year? No Yes No  Comment - pt had accidental fall in kitchen caused injury to right knee and hip; pt denies tripping or losing balance -  Number falls in past yr: - 1 -  Injury with Fall? - Yes -  Follow up - Falls evaluation completed -   Depression Screen PHQ 2/9 Scores 10/12/2017 08/11/2016 07/17/2013  PHQ - 2 Score 0 1 0  PHQ- 9 Score 7 - -     Cognitive Function MMSE - Mini Mental State Exam 10/12/2017 08/11/2016  Orientation to time 5 5  Orientation to Place 5 5  Registration 3 3  Attention/ Calculation 0 0  Recall 3 3  Language- name 2 objects 0 0  Language- repeat 1 1  Language- follow 3 step command 3 3  Language- read & follow direction 0 0  Write a sentence 0 0  Copy design 0 0  Total score 20 20        Immunization History  Administered Date(s) Administered  . Influenza,inj,Quad PF,6+ Mos 07/17/2013, 09/12/2016, 10/12/2017  . Pneumococcal Conjugate-13 10/12/2017  . Pneumococcal Polysaccharide-23 01/26/2008, 04/16/2010  . Td 03/28/1998, 01/26/2008    Screening Tests Health Maintenance  Topic Date Due  . MAMMOGRAM  12/16/2017  . TETANUS/TDAP  01/25/2018  . INFLUENZA VACCINE  Completed  . DEXA SCAN  Completed  . PNA vac Low Risk Adult  Completed       Plan:     I have personally reviewed, addressed, and noted the following in the patient's chart:  A. Medical and social history B. Use of alcohol, tobacco or illicit drugs  C. Current medications and supplements D. Functional ability and status E.  Nutritional status F.  Physical activity G. Advance directives H. List of other physicians I.  Hospitalizations, surgeries, and ER visits in previous 12 months J.  [place] to include hearing, vision, cognitive, depression L. Referrals and appointments - none  In  addition, I have reviewed and discussed with patient certain preventive protocols, quality metrics, and best practice recommendations. A written personalized care plan for preventive services as well as general preventive health recommendations were provided to patient.  See attached scanned questionnaire for additional information.   Signed,   Lindell Noe, MHA, BS, LPN Health Coach

## 2017-10-13 ENCOUNTER — Ambulatory Visit (INDEPENDENT_AMBULATORY_CARE_PROVIDER_SITE_OTHER): Payer: Medicare Other | Admitting: Family Medicine

## 2017-10-13 ENCOUNTER — Encounter: Payer: Medicare Other | Admitting: Family Medicine

## 2017-10-13 ENCOUNTER — Encounter: Payer: Self-pay | Admitting: Family Medicine

## 2017-10-13 VITALS — BP 128/76 | HR 84 | Temp 98.3°F | Ht 64.5 in | Wt 203.0 lb

## 2017-10-13 DIAGNOSIS — R739 Hyperglycemia, unspecified: Secondary | ICD-10-CM | POA: Diagnosis not present

## 2017-10-13 DIAGNOSIS — Z0000 Encounter for general adult medical examination without abnormal findings: Secondary | ICD-10-CM

## 2017-10-13 DIAGNOSIS — T380X5A Adverse effect of glucocorticoids and synthetic analogues, initial encounter: Secondary | ICD-10-CM

## 2017-10-13 DIAGNOSIS — I10 Essential (primary) hypertension: Secondary | ICD-10-CM

## 2017-10-13 DIAGNOSIS — E669 Obesity, unspecified: Secondary | ICD-10-CM | POA: Diagnosis not present

## 2017-10-13 DIAGNOSIS — E559 Vitamin D deficiency, unspecified: Secondary | ICD-10-CM

## 2017-10-13 DIAGNOSIS — M8580 Other specified disorders of bone density and structure, unspecified site: Secondary | ICD-10-CM | POA: Diagnosis not present

## 2017-10-13 DIAGNOSIS — M353 Polymyalgia rheumatica: Secondary | ICD-10-CM

## 2017-10-13 DIAGNOSIS — E786 Lipoprotein deficiency: Secondary | ICD-10-CM | POA: Diagnosis not present

## 2017-10-13 DIAGNOSIS — D638 Anemia in other chronic diseases classified elsewhere: Secondary | ICD-10-CM | POA: Diagnosis not present

## 2017-10-13 DIAGNOSIS — E66811 Obesity, class 1: Secondary | ICD-10-CM

## 2017-10-13 DIAGNOSIS — E785 Hyperlipidemia, unspecified: Secondary | ICD-10-CM

## 2017-10-13 DIAGNOSIS — M19019 Primary osteoarthritis, unspecified shoulder: Secondary | ICD-10-CM | POA: Diagnosis not present

## 2017-10-13 MED ORDER — ERGOCALCIFEROL 1.25 MG (50000 UT) PO CAPS: 50000.0000 [IU] | ORAL_CAPSULE | ORAL | 0 refills | Status: DC

## 2017-10-13 MED ORDER — RALOXIFENE HCL 60 MG PO TABS: 60.0000 mg | ORAL_TABLET | Freq: Every day | ORAL | 3 refills | Status: DC

## 2017-10-13 NOTE — Progress Notes (Signed)
Subjective:    Patient ID: [name] [name], female    DOB: 10/04/1940, 77 y.o.   MRN: [id number]  HPI Here for health maintenance exam and to review chronic medical problems    Feeling "not the best" but ok   Is in talks with ortho about L shoulder surgery (replacement)  Needs to f/u with heme first regarding her anemia    Wt Readings from Last 3 Encounters:  10/13/17 203 lb (92.1 kg)  10/12/17 203 lb (92.1 kg)  10/01/17 202 lb 9.6 oz (91.9 kg)  wt is stable Exercise is limited  34.31 kg/m   Had amw yesterday  Missed 4000 Hz bilat hearing screen  (does not bother her)  PHQ-9 score 7- has stressors   Mammogram 4/18 nl (already has her appt? Unsure)  Self breast exam -no lumps or changes   bp is stable today  Hx of CAD and retinal vein occ No cp or palpitations or headaches or edema  No side effects to medicines  BP Readings from Last 3 Encounters:  10/13/17 128/76  10/12/17 118/74  10/01/17 122/70      Tetanus shot due in June for 10 y booster   dexa 2/17-mild osteopenia in forearm only - wants to put off her next one due to other things  May have had one fall in the past year - slipped off a stool  No fractures  On chronic steroids for PMR Vit D is quite low at 12.2  (she quit the women's health initiative and stopped)  Takes evista 60 mg -no problems with it   Had flu shot yesterday   Zoster status -interested in shingrix if avail and affordable   Anemia of chronic dz  Lab Results  Component Value Date   WBC 5.5 10/12/2017   HGB 10.2 (L) 10/12/2017   HCT 31.6 (L) 10/12/2017   MCV 94.3 10/12/2017   PLT 243.0 10/12/2017   to return to heme if Hb under 10 - but needs to be seen regarding planning for shoulder surgery   Hyperlipidemia Lab Results  Component Value Date   CHOL 151 10/12/2017   CHOL 128 10/27/2016   CHOL 202 (H) 12/10/2015   Lab Results  Component Value Date   HDL 44.00 10/12/2017   HDL 37 (L) 10/27/2016   HDL 37.60 (L) 12/10/2015     Lab Results  Component Value Date   LDLCALC 52 10/27/2016   LDLCALC 97 05/25/2012   LDLCALC 81 04/20/2011   Lab Results  Component Value Date   TRIG 205.0 (H) 10/12/2017   TRIG 197 (H) 10/27/2016   TRIG 313.0 (H) 12/10/2015   Lab Results  Component Value Date   CHOLHDL 3 10/12/2017   CHOLHDL 3.5 10/27/2016   CHOLHDL 5 12/10/2015   Lab Results  Component Value Date   LDLDIRECT 74.0 10/12/2017   LDLDIRECT 107.0 12/10/2015   LDLDIRECT 100.1 06/26/2013   atorvastatin and diet  Overall improved HDL is up a bit  Taking her medication  Eats well   Hx of hyperglycemia Lab Results  Component Value Date   HGBA1C 5.8 10/12/2017  down from 6.0  PMR On prednisone 5 mg   Lab Results  Component Value Date   CREATININE 1.02 10/12/2017   BUN 18 10/12/2017   NA 139 10/12/2017   K 3.9 10/12/2017   CL 104 10/12/2017   CO2 29 10/12/2017   Lab Results  Component Value Date   ALT 14 10/12/2017   AST 19 10/12/2017  ALKPHOS 43 10/12/2017   BILITOT 0.5 10/12/2017    Lab Results  Component Value Date   TSH 3.09 10/12/2017    Patient Active Problem List   Diagnosis Date Noted  . Preop cardiovascular exam 10/01/2017  . CAD S/P percutaneous coronary angioplasty 01/27/2017  . Aortic regurgitation 01/27/2017  . Angina, class III ([place]) 09/11/2016  . Dyspnea on exertion 08/19/2016  . Left bundle branch block (LBBB) on electrocardiogram 08/19/2016  . Steroid-induced hyperglycemia 10/10/2015  . Estrogen deficiency 10/08/2015  . Chronic fatigue 08/20/2015  . Deficiency anemia 11/17/2013  . Encounter for Medicare annual wellness exam 07/17/2013  . Muscle pain 07/17/2013  . PMR (polymyalgia rheumatica) (HCC) 07/17/2013  . Intertrigo 04/19/2013  . Rectal bleeding 04/19/2013  . Expected blood loss anemia 04/05/2013  . Obesity (BMI 30.0-34.9) 04/05/2013  . 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 IBS (irritable bowel syndrome) 05/30/2012  . Anemia of chronic disease 04/16/2010  .  ANXIETY 04/16/2010  . DYSPHAGIA 03/07/2009  . ESOPHAGEAL STRICTURE 03/04/2009  . GERD 03/04/2009  . Vitamin D deficiency 03/12/2008  . URINARY TRACT INFECTION, RECURRENT 08/07/2007  . 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 FIBROCYSTIC BREAST DISEASE, HX OF 02/11/2007  . Hyperlipidemia with low HDL 01/10/2007  . DEPRESSION 01/10/2007  . RETINAL VEIN OCCLUSION 01/10/2007  . Essential hypertension 01/10/2007  . PEPTIC ULCER DISEASE 01/10/2007  . GALLSTONE PANCREATITIS 01/10/2007  . OSTEOARTHRITIS 01/10/2007  . SKIN CANCER, HX OF 01/10/2007   Past Medical History:  Diagnosis Date  . Anemia    of chronic disease  . Anxiety   . Arthritis    "psoratic arthritis; new dx" (09/11/2016)  . CAD S/P percutaneous coronary angioplasty 08/2017   mLCx 90% -> 0% (2.75 mm x 16 mm) Synergy DES PCI Ostial D1 70% (not PTCA target). Moderate ostial and mD2.  LAD tandem 50% stenoses w/ negative FFR (0.86)  . Chronic lower back pain   . Complication of anesthesia 1980 and 1982   trouble waking up after breast biopsy  . Depression   . Fibrocystic breast   . Headache    "monthly" (09/11/2016)  . High cholesterol   . History of blood transfusion    "several since age 61; I've had them after most of my surgeries" (09/11/2016)  . Hx of skin cancer, basal cell 2013 and 2012   right inner, lower leg; several scattered around my body  . Hypertension   . Iron deficiency anemia    "I've had an iron infusion"  . Myocardial infarction [facility])    "doctor said there are signs I've had a heart attack; don't know when" (09/11/2016)  . Osteoarthritis   . Osteopenia   . Peptic ulcer disease    H pylori  . PMR (polymyalgia rheumatica) (HCC)   . Psoriasis    Past Surgical History:  Procedure Laterality Date  . APPENDECTOMY    . BACK SURGERY    . BREAST BIOPSY Left 1980 and 1982   "benign"  . CARDIAC CATHETERIZATION N/A 09/11/2016   Procedure: Left Heart Cath and Coronary Angiography;  Surgeon: Leonie Man, MD;  Location: [place] [facility];  Service: Cardiovascular: mLCx 90% * (PCI). Ostial D1 70% (not PTCA target). Mod ostial and mD2.  LAD tandem 50% stenoses w/ negative FFR (0.86)  . CARDIAC CATHETERIZATION N/A 09/11/2016   Procedure: Intravascular Pressure Wire/FFR Study;  Surgeon: Leonie Man, MD;  Location: [place] [facility];  Service: Cardiovascular: --  LAD tandem 50% stenoses w/ negative FFR (  0.86  . CARDIAC CATHETERIZATION N/A 09/11/2016   Procedure: Coronary Stent Intervention;  Surgeon: Leonie Man, MD;  Location: [place] [facility];  Service: Cardiovascular: -- DES PCI mLCx 90%--0% SYNERGY DES 2.75 MM X 16 MM.  Marland Kitchen CARDIAC CATHETERIZATION  2001  . CATARACT EXTRACTION W/ INTRAOCULAR LENS  IMPLANT, BILATERAL  July -  August 2017  . DILATION AND CURETTAGE OF UTERUS  X 3  . ESOPHAGOGASTRODUODENOSCOPY  03/2009   ulcer,HP, GERD stricture  . EXCISIONAL HEMORRHOIDECTOMY    . HIP ARTHROPLASTY Left 2010  . JOINT REPLACEMENT    . KNEE ARTHROSCOPY Bilateral   . LAPAROSCOPIC CHOLECYSTECTOMY  2005  . LUMBAR DISC SURGERY  11/1984; 1987; 1989; 2004; 2005 X 2   deg. disk lumber spine   . MOHS SURGERY Right    "inside my lower leg"  . NASAL SINUS SURGERY  1970s  . NM MYOVIEW LTD  08/28/2016   EF ~35-40%.  ? Prior Small Inferior-apical & septal infarct (w/o ischemia).  Difuse HK - worse in inferoapex  . NM MYOVIEW LTD  08/2016   intermediate risk with EF 30-40% and small inferior apical and septal wall infarct with ischemia. Suggest prior infarct with no ischemia. Ischemic area correlates with echo WMA  . TOTAL HIP ARTHROPLASTY Right 04/04/2013   Procedure: RIGHT TOTAL HIP ARTHROPLASTY ANTERIOR APPROACH;  Surgeon: Mauri Pole, MD;  Location: [facility];  Service: Orthopedics;  Laterality: Right;  . TOTAL KNEE ARTHROPLASTY Right 2009  . TRANSTHORACIC ECHOCARDIOGRAM  08/2016   mild conc LVH. EF 45-50% - anteroseptal & inferoseptal HK. Mod AI.  Marland Kitchen TRANSTHORACIC ECHOCARDIOGRAM  08/2017    (post PCI) --> EF normalized:  Normal LV systolic function; mild diastolic dysfunction; mild   LVH; sclerotic aortic valve with mild AI.  Marland Kitchen TUBAL LIGATION    . VAGINAL HYSTERECTOMY  1970s   partial    Social History   Tobacco Use  . Smoking status: Never Smoker  . Smokeless tobacco: Never Used  Substance Use Topics  . Alcohol use: Yes    Alcohol/week: 0.0 oz    Comment: 09/11/2016 "might have a margarita q 3-4 months"  . Drug use: No   Family History  Problem Relation Age of Onset  . Arthritis Mother   . Emphysema Mother   . Hyperlipidemia Mother   . Hypertension Mother   . Heart disease Brother        CAD   Allergies  Allergen Reactions  . Ace Inhibitors     REACTION: cough  . Amoxicillin-Pot Clavulanate     REACTION: gi upset Has patient had a PCN reaction causing immediate rash, facial/tongue/throat swelling, SOB or lightheadedness with hypotension: No Has patient had a PCN reaction causing severe rash involving mucus membranes or skin necrosis: No Has patient had a PCN reaction that required hospitalization No Has patient had a PCN reaction occurring within the last 10 years: No If all of the above answers are "NO", then may proceed with Cephalosporin use.   Marland Kitchen Oxaprozin     REACTION: unknown  . Pregabalin     REACTION: swelling  . Sulfonamide Derivatives     REACTION: UNKNOWN   Current Outpatient Medications on File Prior to Visit  Medication Sig Dispense Refill  . acetaminophen (TYLENOL) 500 MG tablet Take 1,000 mg by mouth every 6 (six) hours as needed for moderate pain.    Marland Kitchen amoxicillin (AMOXIL) 500 MG capsule Take 2,000 mg by mouth as needed.     Marland Kitchen  atorvastatin (LIPITOR) 20 MG tablet TAKE 1 TABLET BY MOUTH EVERY DAY 30 tablet 2  . busPIRone (BUSPAR) 15 MG tablet TAKE 1/2 TO 1 TABLET BY MOUTH TWICE A DAY 180 tablet 1  . clopidogrel (PLAVIX) 75 MG tablet TAKE 1 TABLET (75 MG TOTAL) BY MOUTH DAILY WITH BREAKFAST. 30 tablet 4  . clotrimazole-betamethasone  (LOTRISONE) cream APPLY TO AFFECTED AREA TWICE A DAY UNTIL CLEAR 45 g 5  . folic acid (FOLVITE) 1 MG tablet Take 1 mg by mouth daily.    Marland Kitchen HYDROcodone-acetaminophen (NORCO/VICODIN) 5-325 MG tablet Take 1 tablet by mouth 2 (two) times daily as needed for pain.    . hyoscyamine (LEVSIN, ANASPAZ) 0.125 MG tablet TAKE 1 TABLET BY MOUTH AS NEEDED STOMACH SPASMS 30 tablet 5  . isosorbide mononitrate (IMDUR) 30 MG 24 hr tablet Take 1 tablet (30 mg total) by mouth daily. 30 tablet 3  . losartan-hydrochlorothiazide (HYZAAR) 50-12.5 MG tablet Take 1 tablet by mouth daily. 90 tablet 2  . Melatonin 3 MG CAPS Take 6 mg by mouth at bedtime.    . methotrexate (50 MG/ML) 1 g injection Inject 0.8 mg into the vein once a week.     . metoprolol tartrate (LOPRESSOR) 25 MG tablet Take 1 tablet (25 mg total) by mouth 2 (two) times daily. 180 tablet 3  . oxymetazoline (AFRIN) 0.05 % nasal spray Place 2 sprays into the nose 2 (two) times daily as needed for congestion.    . pantoprazole (PROTONIX) 40 MG tablet TAKE 1 TABLET (40 MG TOTAL) BY MOUTH DAILY. 30 tablet 4  . predniSONE (DELTASONE) 5 MG tablet Take 5 mg by mouth daily with breakfast.     . nitroGLYCERIN (NITROSTAT) 0.4 MG SL tablet Place 1 tablet (0.4 mg total) under the tongue every 5 (five) minutes as needed for chest pain. 25 tablet 5   No current facility-administered medications on file prior to visit.     Review of Systems  Constitutional: Positive for fatigue. Negative for activity change, appetite change, fever and unexpected weight change.  HENT: Negative for congestion, ear pain, rhinorrhea, sinus pressure and sore throat.   Eyes: Negative for pain, redness and visual disturbance.  Respiratory: Negative for cough, shortness of breath and wheezing.   Cardiovascular: Negative for chest pain and palpitations.  Gastrointestinal: Negative for abdominal pain, blood in stool, constipation and diarrhea.  Endocrine: Negative for polydipsia and polyuria.    Genitourinary: Negative for dysuria, frequency and urgency.  Musculoskeletal: Positive for arthralgias. Negative for back pain and myalgias.       Left shoulder pain with poor rom   Skin: Negative for pallor and rash.  Allergic/Immunologic: Negative for environmental allergies.  Neurological: Negative for dizziness, tremors, syncope, weakness and headaches.  Hematological: Negative for adenopathy. Does not bruise/bleed easily.  Psychiatric/Behavioral: Negative for decreased concentration and dysphoric mood. The patient is nervous/anxious.        Stressors are high        Objective:   Physical Exam  Constitutional: She appears well-developed and well-nourished. No distress.  obese and well appearing   HENT:  Head: Normocephalic and atraumatic.  Right Ear: External ear normal.  Left Ear: External ear normal.  Mouth/Throat: Oropharynx is clear and moist.  Round "moon" face from prednisone   Eyes: Conjunctivae and EOM are normal. Pupils are equal, round, and reactive to light. No scleral icterus.  Neck: Normal range of motion. Neck supple. No JVD present. Carotid bruit is not present. No thyromegaly present.  Cardiovascular: Normal rate, regular rhythm, normal heart sounds and intact distal pulses. Exam reveals no gallop.  Pulmonary/Chest: Effort normal and breath sounds normal. No respiratory distress. She has no wheezes. She exhibits no tenderness.  Abdominal: Soft. Bowel sounds are normal. She exhibits no distension, no abdominal bruit and no mass. There is no tenderness.  Genitourinary: No breast swelling, tenderness, discharge or bleeding.  Genitourinary Comments: Breast exam: No mass, nodules, thickening, tenderness, bulging, retraction, inflamation, nipple discharge or skin changes noted.  No axillary or clavicular LA.      Musculoskeletal: Normal range of motion. She exhibits no edema or tenderness.  No kyphosis   Lymphadenopathy:    She has no cervical adenopathy.   Neurological: She is alert. She has normal reflexes. No cranial nerve deficit. She exhibits normal muscle tone. Coordination normal.  Skin: Skin is warm and dry. No rash noted. No erythema. No pallor.  Solar lentigines diffusely sks as well  Psychiatric: She has a normal mood and affect.          Assessment & Plan:   Problem List Items Addressed This Visit      Cardiovascular and Mediastinum   Essential hypertension (Chronic)    bp in fair control at this time  BP Readings from Last 1 Encounters:  10/13/17 128/76   No changes needed Disc lifstyle change with low sodium diet and exercise  Continue cardiology f/u        Musculoskeletal and Integument   Shoulder arthritis    L shoulder  Planning joint replacement      Steroid-induced osteopenia    Continue evista  D is low -will px high dose weekly tx  Due for dexa any time-pt wants to put off another year  No falls or fx  Disc need for calcium/ vitamin D/ wt bearing exercise and bone density test every 2 y to monitor Disc safety/ fracture risk in detail          Other   Anemia of chronic disease    Planning shoulder replacement Pt would like to check in with hematology before surgery to see what can be done about blood ct  Last hb 10.2 fairly stable      Relevant Orders   Ambulatory referral to Hematology   Hyperlipidemia with low HDL (Chronic)    Disc goals for lipids and reasons to control them Rev labs with pt Rev low sat fat diet in detail Continue atorvastatin and diet       Obesity (BMI 30.0-34.9) (Chronic)   PMR (polymyalgia rheumatica) (HCC)    Currently on prednisone 5 mg daily and methotrexate from rheumatology      Routine general medical examination at a health care facility - Primary    Reviewed health habits including diet and exercise and skin cancer prevention Reviewed appropriate screening tests for age  Also reviewed health mt list, fam hx and immunization status , as well as  social and family history   See HPI Labs reviewed amw reviewed  Declines dexa one more year  Planning for shoulder surgery  Enc wt loss  Disc shingrix - will get on wait list at pharmacy      Steroid-induced hyperglycemia    Lab Results  Component Value Date   HGBA1C 5.8 10/12/2017   Continue to follow Still on 5 mg of prednisone  disc imp of low glycemic diet and wt loss to prevent DM2       Vitamin D deficiency    Level  of 12.2  Will tx with high dose weekly tx for 12 weeks 4000 iu D3 otc daily indefinitely  Disc imp to overall health and bone health

## 2017-10-13 NOTE — Patient Instructions (Addendum)
Vitamin D is very low  Take the px once weekly for 12 weeks  Take 4000 iu daily of D3 over the counter from now on   If you are interested in the new shingles vaccine (Shingrix) - call your local pharmacy to check on coverage and availability  You will need to be on a waiting list    We will refer yo to hematology

## 2017-10-14 ENCOUNTER — Inpatient Hospital Stay: Payer: Medicare Other | Attending: Hematology and Oncology | Admitting: Hematology and Oncology

## 2017-10-14 ENCOUNTER — Inpatient Hospital Stay: Payer: Medicare Other

## 2017-10-14 DIAGNOSIS — D638 Anemia in other chronic diseases classified elsewhere: Secondary | ICD-10-CM | POA: Diagnosis not present

## 2017-10-14 DIAGNOSIS — Z79899 Other long term (current) drug therapy: Secondary | ICD-10-CM | POA: Insufficient documentation

## 2017-10-14 DIAGNOSIS — M353 Polymyalgia rheumatica: Secondary | ICD-10-CM | POA: Diagnosis not present

## 2017-10-14 DIAGNOSIS — Z7952 Long term (current) use of systemic steroids: Secondary | ICD-10-CM | POA: Insufficient documentation

## 2017-10-14 DIAGNOSIS — Z0000 Encounter for general adult medical examination without abnormal findings: Secondary | ICD-10-CM | POA: Insufficient documentation

## 2017-10-14 LAB — IRON AND TIBC
Iron: 35 ug/dL (ref 28–170)
Saturation Ratios: 11 % (ref 10.4–31.8)
TIBC: 316 ug/dL (ref 250–450)
UIBC: 281 ug/dL

## 2017-10-14 LAB — VITAMIN B12: VITAMIN B 12: 361 pg/mL (ref 180–914)

## 2017-10-14 LAB — FERRITIN: FERRITIN: 149 ng/mL (ref 11–307)

## 2017-10-14 NOTE — Assessment & Plan Note (Signed)
Reviewed health habits including diet and exercise and skin cancer prevention Reviewed appropriate screening tests for age  Also reviewed health mt list, fam hx and immunization status , as well as social and family history   See HPI Labs reviewed amw reviewed  Declines dexa one more year  Planning for shoulder surgery  Enc wt loss  Disc shingrix - will get on wait list at pharmacy

## 2017-10-14 NOTE — Assessment & Plan Note (Signed)
Disc goals for lipids and reasons to control them Rev labs with pt Rev low sat fat diet in detail Continue atorvastatin and diet

## 2017-10-14 NOTE — Assessment & Plan Note (Signed)
Currently on prednisone 5 mg daily and methotrexate from rheumatology

## 2017-10-14 NOTE — Assessment & Plan Note (Signed)
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 ( anemia of chronic inflammation), patient has chronic polymyalgia rheumatica I reviewed her blood counts with her.  Lab review 10/12/2017: Hemoglobin 10.2, MCV 94, normal WBC and platelet counts, creatinine 1.02  I discussed with her that there is no further investigation is required at this time. She will return back to see [name] on an as-needed basis.

## 2017-10-14 NOTE — Assessment & Plan Note (Signed)
bp in fair control at this time  BP Readings from Last 1 Encounters:  10/13/17 128/76   No changes needed Disc lifstyle change with low sodium diet and exercise  Continue cardiology f/u

## 2017-10-14 NOTE — Assessment & Plan Note (Signed)
Lab Results  Component Value Date   HGBA1C 5.8 10/12/2017   Continue to follow Still on 5 mg of prednisone  disc imp of low glycemic diet and wt loss to prevent DM2

## 2017-10-14 NOTE — Assessment & Plan Note (Signed)
Planning shoulder replacement Pt would like to check in with hematology before surgery to see what can be done about blood ct  Last hb 10.2 fairly stable

## 2017-10-14 NOTE — Assessment & Plan Note (Signed)
Level of 12.2  Will tx with high dose weekly tx for 12 weeks 4000 iu D3 otc daily indefinitely  Disc imp to overall health and bone health

## 2017-10-14 NOTE — Assessment & Plan Note (Signed)
L shoulder  Planning joint replacement

## 2017-10-14 NOTE — Assessment & Plan Note (Signed)
Continue evista  D is low -will px high dose weekly tx  Due for dexa any time-pt wants to put off another year  No falls or fx  Disc need for calcium/ vitamin D/ wt bearing exercise and bone density test every 2 y to monitor Disc safety/ fracture risk in detail

## 2017-10-14 NOTE — Progress Notes (Signed)
Patient Care Team: Tower, [name], MD as PCP - Geoffry Paradise, MD as Consulting Physician (Physical Medicine and Rehabilitation) Gavin Pound, MD as Consulting Physician (Rheumatology) Haverstock, Jennefer Bravo, MD as Referring Physician (Dermatology) Nicholas Lose, MD as Consulting Physician (Hematology and Oncology)  DIAGNOSIS:  Encounter Diagnosis  Name Primary?  Marland Kitchen Anemia of chronic disease     CHIEF COMPLIANT: Follow-up to discuss the management of anemia in the setting of her plan to undergo left shoulder replacement surgery.  INTERVAL HISTORY: [name] [name] is a 77 year old who I had seen previously for anemia of chronic disease.  Most recent hemoglobin was 10.2.  She normally stays between 9-10 g.  She is planning to undergo left shoulder replacement surgery and is very concerned about anemia.  It appears that on previous surgical episodes she was profoundly anemic and had required blood transfusion.  She wanted to make sure that we can maximize her hemoglobin prior to surgery.  She currently takes folic acid because she also takes methotrexate.  She denies any bleeding symptoms.  REVIEW OF SYSTEMS:   Constitutional: Denies fevers, chills or abnormal weight loss Eyes: Denies blurriness of vision Ears, nose, mouth, throat, and face: Denies mucositis or sore throat Respiratory: Denies cough, dyspnea or wheezes Cardiovascular: Denies palpitation, chest discomfort Gastrointestinal:  Denies nausea, heartburn or change in bowel habits Skin: Denies abnormal skin rashes Lymphatics: Denies new lymphadenopathy or easy bruising Neurological:Denies numbness, tingling or new weaknesses Behavioral/Psych: Mood is stable, no new changes  Extremities: No lower extremity edema All other systems were reviewed with the patient and are negative.  I have reviewed the past medical history, past surgical history, social history and family history with the patient and they are unchanged  from previous note.  ALLERGIES:  is allergic to ace inhibitors; amoxicillin-pot clavulanate; oxaprozin; pregabalin; and sulfonamide derivatives.  MEDICATIONS:  Current Outpatient Medications  Medication Sig Dispense Refill  . acetaminophen (TYLENOL) 500 MG tablet Take 1,000 mg by mouth every 6 (six) hours as needed for moderate pain.    Marland Kitchen amoxicillin (AMOXIL) 500 MG capsule Take 2,000 mg by mouth as needed.     Marland Kitchen atorvastatin (LIPITOR) 20 MG tablet TAKE 1 TABLET BY MOUTH EVERY DAY 30 tablet 2  . busPIRone (BUSPAR) 15 MG tablet TAKE 1/2 TO 1 TABLET BY MOUTH TWICE A DAY 180 tablet 1  . clopidogrel (PLAVIX) 75 MG tablet TAKE 1 TABLET (75 MG TOTAL) BY MOUTH DAILY WITH BREAKFAST. 30 tablet 4  . clotrimazole-betamethasone (LOTRISONE) cream APPLY TO AFFECTED AREA TWICE A DAY UNTIL CLEAR 45 g 5  . ergocalciferol (VITAMIN D2) 50000 units capsule Take 1 capsule (50,000 Units total) by mouth once a week. For 12 weeks 12 capsule 0  . folic acid (FOLVITE) 1 MG tablet Take 1 mg by mouth daily.    Marland Kitchen HYDROcodone-acetaminophen (NORCO/VICODIN) 5-325 MG tablet Take 1 tablet by mouth 2 (two) times daily as needed for pain.    . hyoscyamine (LEVSIN, ANASPAZ) 0.125 MG tablet TAKE 1 TABLET BY MOUTH AS NEEDED STOMACH SPASMS 30 tablet 5  . isosorbide mononitrate (IMDUR) 30 MG 24 hr tablet Take 1 tablet (30 mg total) by mouth daily. 30 tablet 3  . losartan-hydrochlorothiazide (HYZAAR) 50-12.5 MG tablet Take 1 tablet by mouth daily. 90 tablet 2  . Melatonin 3 MG CAPS Take 6 mg by mouth at bedtime.    . methotrexate (50 MG/ML) 1 g injection Inject 0.8 mg into the vein once a week.     Marland Kitchen  metoprolol tartrate (LOPRESSOR) 25 MG tablet Take 1 tablet (25 mg total) by mouth 2 (two) times daily. 180 tablet 3  . nitroGLYCERIN (NITROSTAT) 0.4 MG SL tablet Place 1 tablet (0.4 mg total) under the tongue every 5 (five) minutes as needed for chest pain. 25 tablet 5  . oxymetazoline (AFRIN) 0.05 % nasal spray Place 2 sprays into the  nose 2 (two) times daily as needed for congestion.    . pantoprazole (PROTONIX) 40 MG tablet TAKE 1 TABLET (40 MG TOTAL) BY MOUTH DAILY. 30 tablet 4  . predniSONE (DELTASONE) 5 MG tablet Take 5 mg by mouth daily with breakfast.     . raloxifene (EVISTA) 60 MG tablet Take 1 tablet (60 mg total) by mouth daily. 90 tablet 3   No current facility-administered medications for this visit.     PHYSICAL EXAMINATION: ECOG PERFORMANCE STATUS: 1 - Symptomatic but completely ambulatory  Vitals:   10/14/17 1510  BP: 138/62  Pulse: 83  Resp: 18  Temp: 98 F (36.7 C)  SpO2: 98%   Filed Weights   10/14/17 1510  Weight: 202 lb 9.6 oz (91.9 kg)    GENERAL:alert, no distress and comfortable SKIN: skin color, texture, turgor are normal, no rashes or significant lesions EYES: normal, Conjunctiva are pink and non-injected, sclera clear OROPHARYNX:no exudate, no erythema and lips, buccal mucosa, and tongue normal  NECK: supple, thyroid normal size, non-tender, without nodularity LYMPH:  no palpable lymphadenopathy in the cervical, axillary or inguinal LUNGS: clear to auscultation and percussion with normal breathing effort HEART: regular rate & rhythm and no murmurs and no lower extremity edema ABDOMEN:abdomen soft, non-tender and normal bowel sounds MUSCULOSKELETAL:no cyanosis of digits and no clubbing  NEURO: alert & oriented x 3 with fluent speech, no focal motor/sensory deficits EXTREMITIES: No lower extremity edema  LABORATORY DATA:  I have reviewed the data as listed CMP Latest Ref Rng & Units 10/12/2017 10/27/2016 09/12/2016  Glucose 70 - 99 mg/dL 92 - 99  BUN 6 - 23 mg/dL 18 - 17  Creatinine 0.40 - 1.20 mg/dL 1.02 - 1.11(H)  Sodium 135 - 145 mEq/L 139 - 139  Potassium 3.5 - 5.1 mEq/L 3.9 - 3.6  Chloride 96 - 112 mEq/L 104 - 107  CO2 19 - 32 mEq/L 29 - 25  Calcium 8.4 - 10.5 mg/dL 9.2 - 8.6(L)  Total Protein 6.0 - 8.3 g/dL 6.4 6.2 -  Total Bilirubin 0.2 - 1.2 mg/dL 0.5 0.5 -  Alkaline  Phos 39 - 117 U/L 43 47 -  AST 0 - 37 U/L 19 16 -  ALT 0 - 35 U/L 14 9 -    Lab Results  Component Value Date   WBC 5.5 10/12/2017   HGB 10.2 (L) 10/12/2017   HCT 31.6 (L) 10/12/2017   MCV 94.3 10/12/2017   PLT 243.0 10/12/2017   NEUTROABS 3.3 10/12/2017    ASSESSMENT & PLAN:  Anemia of chronic disease 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 ( anemia of chronic inflammation), patient has chronic polymyalgia rheumatica I reviewed her blood counts with her.  Lab review 10/12/2017: Hemoglobin 10.2, MCV 94, normal WBC and platelet counts, creatinine 1.02  Because the patient is planning to undergo left shoulder replacement surgery, I will recheck heriron levels.  If her iron levels are deficient then we will administer IV iron therapy.  This should help increase her hemoglobin.  If however her iron levels are adequate then there would not be any role of IV  iron. I would encourage her physicians to get 2 units of blood ready post surgery so that she can be supported through the postoperative phase.  I will call the patient tomorrow with the results of iron studies. She will return back to see [name] on an as-needed basis.  I spent 15 minutes talking to the patient of which more than half was spent in counseling and coordination of care.  No orders of the defined types were placed in this encounter.  The patient has a good understanding of the overall plan. she agrees with it. she will call with any problems that may develop before the next visit here.   Harriette Ohara, MD 10/14/17

## 2017-10-15 ENCOUNTER — Telehealth: Payer: Self-pay | Admitting: Hematology and Oncology

## 2017-10-15 NOTE — Telephone Encounter (Signed)
I informed the patient that her iron studies and B12 were normal.  There would not be any benefit by giving her additional IV iron or B12 therapy. Since her anemia is related to chronic disease, during her surgery, it would be ideal to have a couple of units of blood available for transfusion to support her anemia postoperatively.

## 2017-10-18 ENCOUNTER — Telehealth: Payer: Self-pay

## 2017-10-18 NOTE — Telephone Encounter (Signed)
   [facility] Pre-operative Risk Assessment    Request for surgical clearance:  1. What type of surgery is being performed? Left reverse TSA    2. When is this surgery scheduled? Pending  3. What type of clearance is required (medical clearance vs. Pharmacy clearance to hold med vs. Both)? Medical clearance  4. Are there any medications that need to be held prior to surgery and how long? ? Plavix   5. Practice name and name of physician performing surgery? [facility]   6. What is your office phone and fax number? [phone], fax [phone]   7. Anesthesia type (None, local, MAC, general) ?Choice anesthetic and interscalene block   Brayley Mackowiak T 10/18/2017, 4:42 PM  _________________________________________________________________   (provider comments below)

## 2017-10-20 NOTE — Telephone Encounter (Signed)
Forwarded to requesting party via EPIC 

## 2017-10-20 NOTE — Telephone Encounter (Signed)
Patient has been seen by Dr. Ellyn Hack on 10/15/2017 and cleared for surgery at that time.   Preop cardiovascular exam     She is 1 year out from rest rosacea and with no true anginal symptoms. She still has dyspnea however. Echo looks good. Now off of aspirin daily, but is on clopidogrel.  Planned surgery would be left shoulder rotator cuff surgery. The only concerning feature is her continued dyspnea with exertion. He does seem to be euvolemic.  As she is now had an echo that essentially normal, and no real changes symptoms since last cath, and I'll see any reason to consider ischemic evaluation are to her having shoulder surgery. My recommendation would be to go forth shoulder surgery, if necessary she can hold Plavix 5-7 days preoperatively restart when safe post op. - This is likely resistant needs to be taken since she is quite symptomatic with this shoulder.     For further cardiac testing needed.    Primary Cardiologist: Glenetta Hew, MD  Chart reviewed as part of pre-operative protocol coverage. Given past medical history and time since last visit, based on ACC/AHA guidelines, [name] [name] would be at acceptable risk for the planned procedure without further cardiovascular testing.   I will route this recommendation to the requesting party via Epic fax function and remove from pre-op pool.  Please call with questions.  [name] Sims DNP, ANP, AACC  10/20/2017, 1:50 PM

## 2017-10-26 ENCOUNTER — Telehealth: Payer: Self-pay | Admitting: Family Medicine

## 2017-10-26 MED ORDER — OSELTAMIVIR PHOSPHATE 75 MG PO CAPS: 75.0000 mg | ORAL_CAPSULE | Freq: Every day | ORAL | 0 refills | Status: AC

## 2017-10-26 NOTE — Telephone Encounter (Signed)
Copied from [place] [id number]. Topic: General - Other >> Oct 26, 2017 11:13 AM Darl Householder, RMA wrote: Reason for CRM: Patient is requesting a prescription for Tamiflu due to patient's grandson lives at home and has been diagnosed with the Flu

## 2017-10-26 NOTE — Telephone Encounter (Signed)
Pt notified Rx sent 

## 2017-10-26 NOTE — Telephone Encounter (Signed)
I sent it  Daily for 10 days

## 2017-10-26 NOTE — Telephone Encounter (Signed)
I spoke with pt and pts grandson has been sick since 10/24/17 and was dx with flu on 10/26/17. Pt does not have any symptoms and request tamiflu as preventative.  [facility].

## 2017-10-29 ENCOUNTER — Other Ambulatory Visit: Payer: Self-pay | Admitting: Cardiology

## 2017-10-29 NOTE — Telephone Encounter (Signed)
REFILL 

## 2017-12-02 NOTE — H&P (Signed)
[name] [name] is an 77 y.o. female.    Chief Complaint: left shoulder pain  HPI: Pt is a 77 y.o. female complaining of left shoulder pain for multiple years. Pain had continually increased since the beginning. X-rays in the clinic show end-stage arthritic changes of the left shoulder. Pt has tried various conservative treatments which have failed to alleviate their symptoms, including injections and therapy. Various options are discussed with the patient. Risks, benefits and expectations were discussed with the patient. Patient understand the risks, benefits and expectations and wishes to proceed with surgery.   PCP:  Abner Greenspan, MD  D/C Plans: Home  PMH: Past Medical History:  Diagnosis Date  . Anemia    of chronic disease  . Anxiety   . Arthritis    "psoratic arthritis; new dx" (09/11/2016)  . CAD S/P percutaneous coronary angioplasty 08/2017   mLCx 90% -> 0% (2.75 mm x 16 mm) Synergy DES PCI Ostial D1 70% (not PTCA target). Moderate ostial and mD2.  LAD tandem 50% stenoses w/ negative FFR (0.86)  . Chronic lower back pain   . Complication of anesthesia 1980 and 1982   trouble waking up after breast biopsy  . Depression   . Fibrocystic breast   . Headache    "monthly" (09/11/2016)  . High cholesterol   . History of blood transfusion    "several since age 4; I've had them after most of my surgeries" (09/11/2016)  . Hx of skin cancer, basal cell 2013 and 2012   right inner, lower leg; several scattered around my body  . Hypertension   . Iron deficiency anemia    "I've had an iron infusion"  . Myocardial infarction [facility])    "doctor said there are signs I've had a heart attack; don't know when" (09/11/2016)  . Osteoarthritis   . Osteopenia   . Peptic ulcer disease    H pylori  . PMR (polymyalgia rheumatica) (HCC)   . Psoriasis     PSH: Past Surgical History:  Procedure Laterality Date  . APPENDECTOMY    . BACK SURGERY    . BREAST BIOPSY Left 1980 and 1982   "benign"  . CARDIAC CATHETERIZATION N/A 09/11/2016   Procedure: Left Heart Cath and Coronary Angiography;  Surgeon: Leonie Man, MD;  Location: [facility];  Service: Cardiovascular: mLCx 90% * (PCI). Ostial D1 70% (not PTCA target). Mod ostial and mD2.  LAD tandem 50% stenoses w/ negative FFR (0.86)  . CARDIAC CATHETERIZATION N/A 09/11/2016   Procedure: Intravascular Pressure Wire/FFR Study;  Surgeon: Leonie Man, MD;  Location: [facility];  Service: Cardiovascular: --  LAD tandem 50% stenoses w/ negative FFR (0.86  . CARDIAC CATHETERIZATION N/A 09/11/2016   Procedure: Coronary Stent Intervention;  Surgeon: Leonie Man, MD;  Location: [facility];  Service: Cardiovascular: -- DES PCI mLCx 90%--0% SYNERGY DES 2.75 MM X 16 MM.  Marland Kitchen CARDIAC CATHETERIZATION  2001  . CATARACT EXTRACTION W/ INTRAOCULAR LENS  IMPLANT, BILATERAL  July -  August 2017  . DILATION AND CURETTAGE OF UTERUS  X 3  . ESOPHAGOGASTRODUODENOSCOPY  03/2009   ulcer,HP, GERD stricture  . EXCISIONAL HEMORRHOIDECTOMY    . HIP ARTHROPLASTY Left 2010  . JOINT REPLACEMENT    . KNEE ARTHROSCOPY Bilateral   . LAPAROSCOPIC CHOLECYSTECTOMY  2005  . LUMBAR DISC SURGERY  11/1984; 1987; 1989; 2004; 2005 X 2   deg. disk lumber spine   . MOHS SURGERY  Right    "inside my lower leg"  . NASAL SINUS SURGERY  1970s  . NM MYOVIEW LTD  08/28/2016   EF ~35-40%.  ? Prior Small Inferior-apical & septal infarct (w/o ischemia).  Difuse HK - worse in inferoapex  . NM MYOVIEW LTD  08/2016   intermediate risk with EF 30-40% and small inferior apical and septal wall infarct with ischemia. Suggest prior infarct with no ischemia. Ischemic area correlates with echo WMA  . TOTAL HIP ARTHROPLASTY Right 04/04/2013   Procedure: RIGHT TOTAL HIP ARTHROPLASTY ANTERIOR APPROACH;  Surgeon: Mauri Pole, MD;  Location: [facility];  Service: Orthopedics;  Laterality: Right;  . TOTAL KNEE ARTHROPLASTY Right 2009  . TRANSTHORACIC ECHOCARDIOGRAM   08/2016   mild conc LVH. EF 45-50% - anteroseptal & inferoseptal HK. Mod AI.  Marland Kitchen TRANSTHORACIC ECHOCARDIOGRAM  08/2017   (post PCI) --> EF normalized:  Normal LV systolic function; mild diastolic dysfunction; mild   LVH; sclerotic aortic valve with mild AI.  Marland Kitchen TUBAL LIGATION    . VAGINAL HYSTERECTOMY  1970s   partial     Social History:  reports that she has never smoked. She has never used smokeless tobacco. She reports that she drinks alcohol. She reports that she does not use drugs.  Allergies:  Allergies  Allergen Reactions  . Ace Inhibitors     REACTION: cough  . Amoxicillin-Pot Clavulanate     REACTION: gi upset Has patient had a PCN reaction causing immediate rash, facial/tongue/throat swelling, SOB or lightheadedness with hypotension: No Has patient had a PCN reaction causing severe rash involving mucus membranes or skin necrosis: No Has patient had a PCN reaction that required hospitalization No Has patient had a PCN reaction occurring within the last 10 years: No If all of the above answers are "NO", then may proceed with Cephalosporin use.   Marland Kitchen Oxaprozin     REACTION: unknown  . Pregabalin     REACTION: swelling  . Sulfonamide Derivatives     REACTION: UNKNOWN    Medications: No current facility-administered medications for this encounter.    Current Outpatient Medications  Medication Sig Dispense Refill  . acetaminophen (TYLENOL) 500 MG tablet Take 1,000 mg by mouth every 6 (six) hours as needed for moderate pain.    Marland Kitchen amoxicillin (AMOXIL) 500 MG capsule Take 2,000 mg by mouth as needed.     Marland Kitchen atorvastatin (LIPITOR) 20 MG tablet TAKE 1 TABLET BY MOUTH EVERY DAY 30 tablet 4  . busPIRone (BUSPAR) 15 MG tablet TAKE 1/2 TO 1 TABLET BY MOUTH TWICE A DAY 180 tablet 1  . clopidogrel (PLAVIX) 75 MG tablet TAKE 1 TABLET (75 MG TOTAL) BY MOUTH DAILY WITH BREAKFAST. 30 tablet 4  . clotrimazole-betamethasone (LOTRISONE) cream APPLY TO AFFECTED AREA TWICE A DAY UNTIL CLEAR 45 g  5  . ergocalciferol (VITAMIN D2) 50000 units capsule Take 1 capsule (50,000 Units total) by mouth once a week. For 12 weeks 12 capsule 0  . folic acid (FOLVITE) 1 MG tablet Take 1 mg by mouth daily.    Marland Kitchen HYDROcodone-acetaminophen (NORCO/VICODIN) 5-325 MG tablet Take 1 tablet by mouth 2 (two) times daily as needed for pain.    . hyoscyamine (LEVSIN, ANASPAZ) 0.125 MG tablet TAKE 1 TABLET BY MOUTH AS NEEDED STOMACH SPASMS 30 tablet 5  . isosorbide mononitrate (IMDUR) 30 MG 24 hr tablet Take 1 tablet (30 mg total) by mouth daily. 30 tablet 3  . losartan-hydrochlorothiazide (HYZAAR) 50-12.5 MG tablet Take 1 tablet by mouth  daily. 90 tablet 2  . Melatonin 3 MG CAPS Take 6 mg by mouth at bedtime.    . methotrexate (50 MG/ML) 1 g injection Inject 0.8 mg into the vein once a week.     . metoprolol tartrate (LOPRESSOR) 25 MG tablet Take 1 tablet (25 mg total) by mouth 2 (two) times daily. 180 tablet 3  . nitroGLYCERIN (NITROSTAT) 0.4 MG SL tablet Place 1 tablet (0.4 mg total) under the tongue every 5 (five) minutes as needed for chest pain. 25 tablet 5  . oxymetazoline (AFRIN) 0.05 % nasal spray Place 2 sprays into the nose 2 (two) times daily as needed for congestion.    . pantoprazole (PROTONIX) 40 MG tablet TAKE 1 TABLET (40 MG TOTAL) BY MOUTH DAILY. 30 tablet 4  . predniSONE (DELTASONE) 5 MG tablet Take 5 mg by mouth daily with breakfast.     . raloxifene (EVISTA) 60 MG tablet Take 1 tablet (60 mg total) by mouth daily. 90 tablet 3    No results found for this or any previous visit (from the past 48 hour(s)). No results found.  ROS: Pain with rom of the left upper extremity  Physical Exam:  Alert and oriented 77 y.o. female in no acute distress Cranial nerves 2-12 intact Cervical spine: full rom with no tenderness, nv intact distally Chest: active breath sounds bilaterally, no wheeze rhonchi or rales Heart: regular rate and rhythm, no murmur Abd: non tender non distended with active bowel  sounds Hip is stable with rom  Left shoulder with limited rom and weakness nv intact distally No rashes or edema Limited strength with ER and IR  Assessment/Plan Assessment: left shoulder cuff arthropathy  Plan: Patient will undergo a left reverse total shoulder by Dr. Veverly Fells at [facility]. Risks benefits and expectations were discussed with the patient. Patient understand risks, benefits and expectations and wishes to proceed.  Merla Riches PA-C, MPAS [facility] is now [employer] [address] [place], [place], [place] [address] Phone: [phone] [email or web address] Facebook  [employer]

## 2017-12-08 NOTE — Pre-Procedure Instructions (Signed)
[name] [name]  12/08/2017      [id number] [place], [place] - [address] [place] [address] [place] [address] Phone: [phone] Fax: [phone]    Your procedure is scheduled on  Friday  4/26/192  Report to [facility] Admitting at 830 A.M.  Call this number if you have problems the morning of surgery:  [phone]   Remember:  Do not eat food or drink liquids after midnight.  Take these medicines the morning of surgery with A SIP OF WATER - HYDROCODONE IF NEEDED, ISOSORBIDE MONONITRATE (IMDUR), HYOSCYAMINE IF NEEDED, METOPROLOL(LOPRESSOR), PANTOPRAZOLE (PROTONIX), PREDNISONE , RALOXIFENE(EVISTA)  7 days prior to surgery STOP taking any Aspirin(unless otherwise instructed by your surgeon), Aleve, Naproxen, Ibuprofen, Motrin, Advil, Goody's, BC's, all herbal medications, fish oil, and all vitamins Stop plavix as directed by physician    Do not wear jewelry, make-up or nail polish.  Do not wear lotions, powders, or perfumes, or deodorant.  Do not shave 48 hours prior to surgery.  Men may shave face and neck.  Do not bring valuables to the hospital.  [facility] is not responsible for any belongings or valuables.  Contacts, dentures or bridgework may not be worn into surgery.  Leave your suitcase in the car.  After surgery it may be brought to your room.  For patients admitted to the hospital, discharge time will be determined by your treatment team.  Patients discharged the day of surgery will not be allowed to drive home.   Name and phone number of your driver:    Special instructions:  [facility] - Preparing for Surgery  Before surgery, you can play an important role.  Because skin is not sterile, your skin needs to be as free of germs as possible.  You can reduce the number of germs on you skin by washing with CHG (chlorahexidine gluconate) soap before surgery.  CHG is an antiseptic cleaner which kills germs  and bonds with the skin to continue killing germs even after washing.  Please DO NOT use if you have an allergy to CHG or antibacterial soaps.  If your skin becomes reddened/irritated stop using the CHG and inform your nurse when you arrive at Short Stay.  Do not shave (including legs and underarms) for at least 48 hours prior to the first CHG shower.  You may shave your face.  Please follow these instructions carefully:   1.  Shower with CHG Soap the night before surgery and the                                morning of Surgery.  2.  If you choose to wash your hair, wash your hair first as usual with your       normal shampoo.  3.  After you shampoo, rinse your hair and body thoroughly to remove the                      Shampoo.  4.  Use CHG as you would any other liquid soap.  You can apply chg directly       to the skin and wash gently with scrungie or a clean washcloth.  5.  Apply the CHG Soap to your body ONLY FROM THE NECK DOWN.        Do not use on open wounds or open sores.  Avoid contact with your eyes,       ears, mouth and genitals (private parts).  Wash genitals (private parts)       with your normal soap.  6.  Wash thoroughly, paying special attention to the area where your surgery        will be performed.  7.  Thoroughly rinse your body with warm water from the neck down.  8.  DO NOT shower/wash with your normal soap after using and rinsing off       the CHG Soap.  9.  Pat yourself dry with a clean towel.            10.  Wear clean pajamas.            11.  Place clean sheets on your bed the night of your first shower and do not        sleep with pets.  Day of Surgery  Do not apply any lotions/deoderants the morning of surgery.  Please wear clean clothes to the hospital/surgery center.     Please read over the following fact sheets that you were given. Surgical Site Infection Prevention and Anesthesia Post-op Instructions

## 2017-12-09 ENCOUNTER — Encounter (HOSPITAL_COMMUNITY): Payer: Self-pay

## 2017-12-09 ENCOUNTER — Other Ambulatory Visit: Payer: Self-pay

## 2017-12-09 ENCOUNTER — Encounter (HOSPITAL_COMMUNITY)
Admission: RE | Admit: 2017-12-09 | Discharge: 2017-12-09 | Disposition: A | Discharge status: Home / Self Care | Payer: Medicare Other | Source: Ambulatory Visit | Attending: Orthopedic Surgery | Admitting: Orthopedic Surgery

## 2017-12-09 DIAGNOSIS — I2510 Atherosclerotic heart disease of native coronary artery without angina pectoris: Secondary | ICD-10-CM | POA: Diagnosis not present

## 2017-12-09 DIAGNOSIS — R9431 Abnormal electrocardiogram [ECG] [EKG]: Secondary | ICD-10-CM | POA: Diagnosis not present

## 2017-12-09 DIAGNOSIS — I10 Essential (primary) hypertension: Secondary | ICD-10-CM | POA: Diagnosis not present

## 2017-12-09 DIAGNOSIS — D649 Anemia, unspecified: Secondary | ICD-10-CM | POA: Insufficient documentation

## 2017-12-09 DIAGNOSIS — I459 Conduction disorder, unspecified: Secondary | ICD-10-CM | POA: Insufficient documentation

## 2017-12-09 DIAGNOSIS — Z01812 Encounter for preprocedural laboratory examination: Secondary | ICD-10-CM | POA: Diagnosis not present

## 2017-12-09 DIAGNOSIS — Z01810 Encounter for preprocedural cardiovascular examination: Secondary | ICD-10-CM | POA: Diagnosis not present

## 2017-12-09 HISTORY — DX: Malignant (primary) neoplasm, unspecified: C80.1

## 2017-12-09 HISTORY — DX: Gastro-esophageal reflux disease without esophagitis: K21.9

## 2017-12-09 HISTORY — DX: Other specified postprocedural states: Z98.890

## 2017-12-09 HISTORY — DX: Dyspnea, unspecified: R06.00

## 2017-12-09 HISTORY — DX: Nausea with vomiting, unspecified: R11.2

## 2017-12-09 LAB — CBC
HEMATOCRIT: 32.7 % — AB (ref 36.0–46.0)
Hemoglobin: 10 g/dL — ABNORMAL LOW (ref 12.0–15.0)
MCH: 29.2 pg (ref 26.0–34.0)
MCHC: 30.6 g/dL (ref 30.0–36.0)
MCV: 95.3 fL (ref 78.0–100.0)
Platelets: 279 10*3/uL (ref 150–400)
RBC: 3.43 MIL/uL — ABNORMAL LOW (ref 3.87–5.11)
RDW: 13.7 % (ref 11.5–15.5)
WBC: 10 10*3/uL (ref 4.0–10.5)

## 2017-12-09 LAB — BASIC METABOLIC PANEL
Anion gap: 11 (ref 5–15)
BUN: 18 mg/dL (ref 6–20)
CO2: 19 mmol/L — AB (ref 22–32)
CREATININE: 1.12 mg/dL — AB (ref 0.44–1.00)
Calcium: 8.7 mg/dL — ABNORMAL LOW (ref 8.9–10.3)
Chloride: 106 mmol/L (ref 101–111)
GFR calc non Af Amer: 46 mL/min — ABNORMAL LOW (ref >60)
GFR, EST AFRICAN AMERICAN: 53 mL/min — AB (ref >60)
Glucose, Bld: 107 mg/dL — ABNORMAL HIGH (ref 65–99)
Potassium: 4 mmol/L (ref 3.5–5.1)
Sodium: 136 mmol/L (ref 135–145)

## 2017-12-09 LAB — SURGICAL PCR SCREEN
MRSA, PCR: NEGATIVE
Staphylococcus aureus: NEGATIVE

## 2017-12-13 NOTE — Progress Notes (Signed)
Anesthesia Chart Review:   Case:  [id number] Date/Time:  12/17/17 1015   Procedure:  LEFT REVERSE SHOULDER ARTHROPLASTY (Left Shoulder)   Anesthesia type:  Choice   Pre-op diagnosis:  Left shoulder end stage osteoarthritis   Location:  [facility] OR ROOM 04 / [facility]   Surgeon:  Netta Cedars, MD      DISCUSSION: 77 year old female with CAD (DES to CX 08/2016, OM1 70% poor PCI target, moderate disease otherwise). Dr. Ellyn Hack feels pt can proceed with surgery.  Hematologist Dr. Lindi Adie manages pt's anemia and documents "I would encourage her physicians to get 2 units of blood ready post surgery so that she can be supported through the postoperative phase"   VS: BP 138/62   Pulse 73   Temp 36.6 C   Resp 20   Ht 5\' 4"  (1.626 m)   Wt 202 lb 12.8 oz (92 kg)   SpO2 98%   BMI 34.81 kg/m    PROVIDERS: Tower, [name], MD Patient Care Team: Suella Broad, MD as Consulting Physician (Physical Medicine and Rehabilitation) Gavin Pound, MD as Consulting Physician (Rheumatology) Nicholas Lose, MD as Consulting Physician (Hematology and Oncology) who follows pt's anemia. Last office visit 10/14/17 Cardiologist is Glenetta Hew, MD. Cleared pt for surgery at last office visit 10/12/17 noting pt has not had any change in CV symptoms since last cath 08/2016   LABS: Labs reviewed: Acceptable for surgery. (all labs ordered are listed, but only abnormal results are displayed)  Labs Reviewed  CBC - Abnormal; Notable for the following components:      Result Value   RBC 3.43 (*)    Hemoglobin 10.0 (*)    HCT 32.7 (*)    All other components within normal limits  BASIC METABOLIC PANEL - Abnormal; Notable for the following components:   CO2 19 (*)    Glucose, Bld 107 (*)    Creatinine, Ser 1.12 (*)    Calcium 8.7 (*)    GFR calc non Af Amer 46 (*)    GFR calc Af Amer 53 (*)    All other components within normal limits  SURGICAL PCR SCREEN  TYPE AND SCREEN     EKG 12/09/17: NSR. Nonspecific ST and T  wave abnormality   CV:  Echo 09/07/17:  - Left ventricle: The cavity size was normal. Wall thickness was increased in a pattern of mild LVH. Systolic function was normal. The estimated ejection fraction was in the range of 55% to 60%. Wall motion was normal; there were no regional wall motion abnormalities. Doppler parameters are consistent with abnormal left ventricular relaxation (grade 1 diastolic dysfunction). Doppler parameters are consistent with high ventricular filling pressure. - Aortic valve: There was mild regurgitation. - Mitral valve: Calcified annulus. - Impressions: Normal LV systolic function; mild diastolic dysfunction; mild LVH; sclerotic aortic valve with mild AI.  Cardiac cath 09/11/16:   Mid Cx lesion, 90% stenosed.  A STENT SYNERGY DES [id number] drug eluting stent was successfully placed.  Post intervention, there is a 0% residual stenosis.  Ost 1st Diag lesion, 70% stenosed. - Not good PCI/PTCA target  Ost 2nd Diag to 2nd Diag lesion, 40% stenosed. Ost 3rd Diag to 3rd Diag lesion, 60% stenosed.  Ost LAD lesion, 50% stenosed. Dist LAD lesion, 50% stenosed. - Combined FFR 9.14-7.8  LV end diastolic pressure is mildly elevated. - Successful DES stenting of the most significant culprit lesion in the circumflex without complication.   Past Medical History:  Diagnosis Date  .  Anemia    of chronic disease  . Anxiety   . Arthritis    "psoratic arthritis; new dx" (09/11/2016)  . CAD S/P percutaneous coronary angioplasty 08/2017   mLCx 90% -> 0% (2.75 mm x 16 mm) Synergy DES PCI Ostial D1 70% (not PTCA target). Moderate ostial and mD2.  LAD tandem 50% stenoses w/ negative FFR (0.86)  . Cancer ([place])    skin cancer on leg  . Chronic lower back pain   . Complication of anesthesia 1980 and 1982   trouble waking up after breast biopsy, many surgeries since no problem  . Depression   . Dyspnea    w/ exertion  . Fibrocystic breast   . GERD (gastroesophageal reflux  disease)   . Headache    "monthly" (09/11/2016)  . High cholesterol   . History of blood transfusion    "several since age 62; I've had them after most of my surgeries" (09/11/2016)  . Hx of skin cancer, basal cell 2013 and 2012   right inner, lower leg; several scattered around my body  . Hypertension   . Iron deficiency anemia    "I've had an iron infusion"  . Myocardial infarction [facility])    "doctor said there are signs I've had a heart attack; don't know when" (09/11/2016)  . Osteoarthritis   . Osteopenia   . Peptic ulcer disease    H pylori  . PMR (polymyalgia rheumatica) (HCC)   . PONV (postoperative nausea and vomiting)   . Psoriasis     Past Surgical History:  Procedure Laterality Date  . APPENDECTOMY    . BACK SURGERY     x 6   . BREAST BIOPSY Left 1980 and 1982   "benign"  . CARDIAC CATHETERIZATION N/A 09/11/2016   Procedure: Left Heart Cath and Coronary Angiography;  Surgeon: Leonie Man, MD;  Location: [facility];  Service: Cardiovascular: mLCx 90% * (PCI). Ostial D1 70% (not PTCA target). Mod ostial and mD2.  LAD tandem 50% stenoses w/ negative FFR (0.86)  . CARDIAC CATHETERIZATION N/A 09/11/2016   Procedure: Intravascular Pressure Wire/FFR Study;  Surgeon: Leonie Man, MD;  Location: [facility];  Service: Cardiovascular: --  LAD tandem 50% stenoses w/ negative FFR (0.86  . CARDIAC CATHETERIZATION N/A 09/11/2016   Procedure: Coronary Stent Intervention;  Surgeon: Leonie Man, MD;  Location: [facility];  Service: Cardiovascular: -- DES PCI mLCx 90%--0% SYNERGY DES 2.75 MM X 16 MM.  Marland Kitchen CARDIAC CATHETERIZATION  2001  . CATARACT EXTRACTION W/ INTRAOCULAR LENS  IMPLANT, BILATERAL  July -  August 2017  . DILATION AND CURETTAGE OF UTERUS  X 3  . ESOPHAGOGASTRODUODENOSCOPY  03/2009   ulcer,HP, GERD stricture  . EXCISIONAL HEMORRHOIDECTOMY    . HIP ARTHROPLASTY Left 2010  . JOINT REPLACEMENT    . KNEE ARTHROSCOPY Bilateral   . LAPAROSCOPIC  CHOLECYSTECTOMY  2005  . LUMBAR DISC SURGERY  11/1984; 1987; 1989; 2004; 2005 X 2   deg. disk lumber spine   . MOHS SURGERY Right    "inside my lower leg"  . NASAL SINUS SURGERY  1970s  . NM MYOVIEW LTD  08/28/2016   EF ~35-40%.  ? Prior Small Inferior-apical & septal infarct (w/o ischemia).  Difuse HK - worse in inferoapex  . NM MYOVIEW LTD  08/2016   intermediate risk with EF 30-40% and small inferior apical and septal wall infarct with ischemia. Suggest prior infarct with no ischemia. Ischemic area correlates  with echo WMA  . TOTAL HIP ARTHROPLASTY Right 04/04/2013   Procedure: RIGHT TOTAL HIP ARTHROPLASTY ANTERIOR APPROACH;  Surgeon: Mauri Pole, MD;  Location: [facility];  Service: Orthopedics;  Laterality: Right;  . TOTAL KNEE ARTHROPLASTY Right 2009  . TRANSTHORACIC ECHOCARDIOGRAM  08/2016   mild conc LVH. EF 45-50% - anteroseptal & inferoseptal HK. Mod AI.  Marland Kitchen TRANSTHORACIC ECHOCARDIOGRAM  08/2017   (post PCI) --> EF normalized:  Normal LV systolic function; mild diastolic dysfunction; mild   LVH; sclerotic aortic valve with mild AI.  Marland Kitchen TUBAL LIGATION    . VAGINAL HYSTERECTOMY  1970s   partial     MEDICATIONS: . acetaminophen (TYLENOL) 500 MG tablet  . amoxicillin (AMOXIL) 500 MG capsule  . atorvastatin (LIPITOR) 20 MG tablet  . busPIRone (BUSPAR) 15 MG tablet  . clopidogrel (PLAVIX) 75 MG tablet  . clotrimazole-betamethasone (LOTRISONE) cream  . ergocalciferol (VITAMIN D2) 50000 units capsule  . folic acid (FOLVITE) 1 MG tablet  . hydrochlorothiazide (HYDRODIURIL) 12.5 MG tablet  . HYDROcodone-acetaminophen (NORCO/VICODIN) 5-325 MG tablet  . hyoscyamine (LEVSIN, ANASPAZ) 0.125 MG tablet  . isosorbide mononitrate (IMDUR) 30 MG 24 hr tablet  . losartan (COZAAR) 50 MG tablet  . losartan-hydrochlorothiazide (HYZAAR) 50-12.5 MG tablet  . Melatonin 3 MG CAPS  . Menthol, Topical Analgesic, (BLUE-EMU MAXIMUM STRENGTH EX)  . methotrexate (50 MG/ML) 1 g injection  . metoprolol  tartrate (LOPRESSOR) 25 MG tablet  . nitroGLYCERIN (NITROSTAT) 0.4 MG SL tablet  . oxymetazoline (AFRIN) 0.05 % nasal spray  . pantoprazole (PROTONIX) 40 MG tablet  . Phenylephrine-Aspirin (ALKA-SELTZER PLUS SINUS PO)  . predniSONE (DELTASONE) 5 MG tablet  . raloxifene (EVISTA) 60 MG tablet   No current facility-administered medications for this encounter.    - Pt to hold plavix 5-7 days before surgery.    If no changes, I anticipate pt can proceed with surgery as scheduled.   Willeen Cass, FNP-BC [facility]/Anesthesiology Phone: [phone] 12/13/2017 10:56 AM

## 2017-12-17 ENCOUNTER — Inpatient Hospital Stay (HOSPITAL_COMMUNITY): Payer: Medicare Other

## 2017-12-17 ENCOUNTER — Inpatient Hospital Stay (HOSPITAL_COMMUNITY): Payer: Medicare Other | Admitting: Vascular Surgery

## 2017-12-17 ENCOUNTER — Other Ambulatory Visit: Payer: Self-pay

## 2017-12-17 ENCOUNTER — Encounter (HOSPITAL_COMMUNITY): Payer: Self-pay | Admitting: Anesthesiology

## 2017-12-17 ENCOUNTER — Inpatient Hospital Stay (HOSPITAL_COMMUNITY): Payer: Medicare Other | Admitting: Anesthesiology

## 2017-12-17 ENCOUNTER — Inpatient Hospital Stay (HOSPITAL_COMMUNITY)
Admission: RE | Admit: 2017-12-17 | Discharge: 2017-12-18 | DRG: 483 | Disposition: A | Discharge status: Home / Self Care | Payer: Medicare Other | Source: Ambulatory Visit | Attending: Orthopedic Surgery | Admitting: Orthopedic Surgery

## 2017-12-17 ENCOUNTER — Encounter (HOSPITAL_COMMUNITY)
Admission: RE | Disposition: A | Discharge status: Home / Self Care | Payer: Self-pay | Source: Ambulatory Visit | Attending: Orthopedic Surgery

## 2017-12-17 DIAGNOSIS — Z886 Allergy status to analgesic agent status: Secondary | ICD-10-CM

## 2017-12-17 DIAGNOSIS — M545 Low back pain: Secondary | ICD-10-CM | POA: Diagnosis present

## 2017-12-17 DIAGNOSIS — Z96653 Presence of artificial knee joint, bilateral: Secondary | ICD-10-CM | POA: Diagnosis present

## 2017-12-17 DIAGNOSIS — L4052 Psoriatic arthritis mutilans: Secondary | ICD-10-CM | POA: Diagnosis present

## 2017-12-17 DIAGNOSIS — Z880 Allergy status to penicillin: Secondary | ICD-10-CM

## 2017-12-17 DIAGNOSIS — D638 Anemia in other chronic diseases classified elsewhere: Secondary | ICD-10-CM | POA: Diagnosis not present

## 2017-12-17 DIAGNOSIS — F329 Major depressive disorder, single episode, unspecified: Secondary | ICD-10-CM | POA: Diagnosis present

## 2017-12-17 DIAGNOSIS — K219 Gastro-esophageal reflux disease without esophagitis: Secondary | ICD-10-CM | POA: Diagnosis present

## 2017-12-17 DIAGNOSIS — I2510 Atherosclerotic heart disease of native coronary artery without angina pectoris: Secondary | ICD-10-CM | POA: Diagnosis not present

## 2017-12-17 DIAGNOSIS — Z9189 Other specified personal risk factors, not elsewhere classified: Secondary | ICD-10-CM

## 2017-12-17 DIAGNOSIS — M19012 Primary osteoarthritis, left shoulder: Secondary | ICD-10-CM | POA: Diagnosis not present

## 2017-12-17 DIAGNOSIS — D509 Iron deficiency anemia, unspecified: Secondary | ICD-10-CM | POA: Diagnosis present

## 2017-12-17 DIAGNOSIS — R51 Headache: Secondary | ICD-10-CM | POA: Diagnosis present

## 2017-12-17 DIAGNOSIS — Z96643 Presence of artificial hip joint, bilateral: Secondary | ICD-10-CM | POA: Diagnosis present

## 2017-12-17 DIAGNOSIS — G8929 Other chronic pain: Secondary | ICD-10-CM | POA: Diagnosis present

## 2017-12-17 DIAGNOSIS — I10 Essential (primary) hypertension: Secondary | ICD-10-CM | POA: Diagnosis present

## 2017-12-17 DIAGNOSIS — M353 Polymyalgia rheumatica: Secondary | ICD-10-CM | POA: Diagnosis present

## 2017-12-17 DIAGNOSIS — M75102 Unspecified rotator cuff tear or rupture of left shoulder, not specified as traumatic: Secondary | ICD-10-CM | POA: Diagnosis present

## 2017-12-17 DIAGNOSIS — R402413 Glasgow coma scale score 13-15, at hospital admission: Secondary | ICD-10-CM | POA: Diagnosis present

## 2017-12-17 DIAGNOSIS — Z79890 Hormone replacement therapy (postmenopausal): Secondary | ICD-10-CM

## 2017-12-17 DIAGNOSIS — Z8711 Personal history of peptic ulcer disease: Secondary | ICD-10-CM | POA: Diagnosis not present

## 2017-12-17 DIAGNOSIS — Z955 Presence of coronary angioplasty implant and graft: Secondary | ICD-10-CM

## 2017-12-17 DIAGNOSIS — E7800 Pure hypercholesterolemia, unspecified: Secondary | ICD-10-CM | POA: Diagnosis present

## 2017-12-17 DIAGNOSIS — L409 Psoriasis, unspecified: Secondary | ICD-10-CM | POA: Diagnosis present

## 2017-12-17 DIAGNOSIS — Z96612 Presence of left artificial shoulder joint: Secondary | ICD-10-CM

## 2017-12-17 DIAGNOSIS — Z79899 Other long term (current) drug therapy: Secondary | ICD-10-CM

## 2017-12-17 DIAGNOSIS — Z882 Allergy status to sulfonamides status: Secondary | ICD-10-CM

## 2017-12-17 DIAGNOSIS — M8580 Other specified disorders of bone density and structure, unspecified site: Secondary | ICD-10-CM | POA: Diagnosis present

## 2017-12-17 DIAGNOSIS — Z7952 Long term (current) use of systemic steroids: Secondary | ICD-10-CM

## 2017-12-17 DIAGNOSIS — Z7902 Long term (current) use of antithrombotics/antiplatelets: Secondary | ICD-10-CM

## 2017-12-17 DIAGNOSIS — Z888 Allergy status to other drugs, medicaments and biological substances status: Secondary | ICD-10-CM

## 2017-12-17 DIAGNOSIS — I252 Old myocardial infarction: Secondary | ICD-10-CM | POA: Diagnosis not present

## 2017-12-17 DIAGNOSIS — F419 Anxiety disorder, unspecified: Secondary | ICD-10-CM | POA: Diagnosis present

## 2017-12-17 DIAGNOSIS — Z792 Long term (current) use of antibiotics: Secondary | ICD-10-CM

## 2017-12-17 HISTORY — PX: REVERSE SHOULDER ARTHROPLASTY: SHX5054

## 2017-12-17 LAB — TYPE AND SCREEN
ABO/RH(D): A NEG
ANTIBODY SCREEN: POSITIVE

## 2017-12-17 SURGERY — ARTHROPLASTY, SHOULDER, TOTAL, REVERSE
Anesthesia: General | Site: Shoulder | Laterality: Left

## 2017-12-17 MED ORDER — MENTHOL 3 MG MT LOZG: 1.0000 | LOZENGE | OROMUCOSAL | Status: DC | PRN

## 2017-12-17 MED ORDER — PHENOL 1.4 % MT LIQD: 1.0000 | OROMUCOSAL | Status: DC | PRN

## 2017-12-17 MED ORDER — ONDANSETRON HCL 4 MG/2ML IJ SOLN: 4.0000 mg | Freq: Once | INTRAMUSCULAR | Status: DC | PRN

## 2017-12-17 MED ORDER — MELATONIN 3 MG PO TABS
6.0000 mg | ORAL_TABLET | Freq: Every day | ORAL | Status: DC
  Administered 2017-12-17: 6 mg via ORAL
  Filled 2017-12-17: qty 2

## 2017-12-17 MED ORDER — FENTANYL CITRATE (PF) 250 MCG/5ML IJ SOLN
INTRAMUSCULAR | Status: AC
  Filled 2017-12-17: qty 5

## 2017-12-17 MED ORDER — POLYETHYLENE GLYCOL 3350 17 G PO PACK: 17.0000 g | PACK | Freq: Every day | ORAL | Status: DC | PRN

## 2017-12-17 MED ORDER — ISOSORBIDE MONONITRATE ER 30 MG PO TB24
30.0000 mg | ORAL_TABLET | Freq: Every day | ORAL | Status: DC
  Administered 2017-12-17: 30 mg via ORAL
  Filled 2017-12-17 (×2): qty 1

## 2017-12-17 MED ORDER — SUGAMMADEX SODIUM 200 MG/2ML IV SOLN
INTRAVENOUS | Status: DC | PRN
  Administered 2017-12-17: 200 mg via INTRAVENOUS

## 2017-12-17 MED ORDER — FOLIC ACID 1 MG PO TABS
1.0000 mg | ORAL_TABLET | Freq: Every day | ORAL | Status: DC
  Administered 2017-12-17: 1 mg via ORAL
  Filled 2017-12-17: qty 1

## 2017-12-17 MED ORDER — ONDANSETRON HCL 4 MG PO TABS: 4.0000 mg | ORAL_TABLET | Freq: Four times a day (QID) | ORAL | Status: DC | PRN

## 2017-12-17 MED ORDER — HYDROCODONE-ACETAMINOPHEN 5-325 MG PO TABS
1.0000 | ORAL_TABLET | ORAL | Status: DC | PRN
  Administered 2017-12-17 – 2017-12-18 (×3): 2 via ORAL
  Filled 2017-12-17 (×3): qty 2

## 2017-12-17 MED ORDER — BUPIVACAINE-EPINEPHRINE (PF) 0.25% -1:200000 IJ SOLN
INTRAMUSCULAR | Status: AC
  Filled 2017-12-17: qty 30

## 2017-12-17 MED ORDER — ONDANSETRON HCL 4 MG/2ML IJ SOLN
INTRAMUSCULAR | Status: DC | PRN
  Administered 2017-12-17: 4 mg via INTRAVENOUS

## 2017-12-17 MED ORDER — LOSARTAN POTASSIUM 50 MG PO TABS: 50.0000 mg | ORAL_TABLET | Freq: Every day | ORAL | Status: DC

## 2017-12-17 MED ORDER — METOPROLOL TARTRATE 25 MG PO TABS
25.0000 mg | ORAL_TABLET | Freq: Two times a day (BID) | ORAL | Status: DC
  Filled 2017-12-17: qty 1

## 2017-12-17 MED ORDER — HYDROCHLOROTHIAZIDE 25 MG PO TABS: 12.5000 mg | ORAL_TABLET | Freq: Every day | ORAL | Status: DC

## 2017-12-17 MED ORDER — VITAMIN D (ERGOCALCIFEROL) 1.25 MG (50000 UNIT) PO CAPS: 50000.0000 [IU] | ORAL_CAPSULE | ORAL | Status: DC

## 2017-12-17 MED ORDER — BUPIVACAINE-EPINEPHRINE 0.25% -1:200000 IJ SOLN
INTRAMUSCULAR | Status: DC | PRN
  Administered 2017-12-17: 10 mL

## 2017-12-17 MED ORDER — DEXAMETHASONE SODIUM PHOSPHATE 10 MG/ML IJ SOLN
INTRAMUSCULAR | Status: AC
  Filled 2017-12-17: qty 1

## 2017-12-17 MED ORDER — HYOSCYAMINE SULFATE 0.125 MG PO TABS: 0.1250 mg | ORAL_TABLET | ORAL | Status: DC | PRN

## 2017-12-17 MED ORDER — PROPOFOL 10 MG/ML IV BOLUS
INTRAVENOUS | Status: DC | PRN
  Administered 2017-12-17: 140 mg via INTRAVENOUS

## 2017-12-17 MED ORDER — CLINDAMYCIN PHOSPHATE 600 MG/50ML IV SOLN
600.0000 mg | Freq: Four times a day (QID) | INTRAVENOUS | Status: AC
  Administered 2017-12-17 – 2017-12-18 (×3): 600 mg via INTRAVENOUS
  Filled 2017-12-17 (×3): qty 50

## 2017-12-17 MED ORDER — FENTANYL CITRATE (PF) 100 MCG/2ML IJ SOLN
INTRAMUSCULAR | Status: DC | PRN
  Administered 2017-12-17: 50 ug via INTRAVENOUS

## 2017-12-17 MED ORDER — METOCLOPRAMIDE HCL 5 MG PO TABS: 5.0000 mg | ORAL_TABLET | Freq: Three times a day (TID) | ORAL | Status: DC | PRN

## 2017-12-17 MED ORDER — ONDANSETRON HCL 4 MG/2ML IJ SOLN: 4.0000 mg | Freq: Four times a day (QID) | INTRAMUSCULAR | Status: DC | PRN

## 2017-12-17 MED ORDER — NITROGLYCERIN 0.4 MG SL SUBL: 0.4000 mg | SUBLINGUAL_TABLET | SUBLINGUAL | Status: DC | PRN

## 2017-12-17 MED ORDER — PREDNISONE 5 MG PO TABS
5.0000 mg | ORAL_TABLET | Freq: Every day | ORAL | Status: DC
Start: 2017-12-18 — End: 2017-12-18
  Filled 2017-12-17: qty 1

## 2017-12-17 MED ORDER — SODIUM CHLORIDE 0.9 % IV SOLN: INTRAVENOUS | Status: DC

## 2017-12-17 MED ORDER — METHOTREXATE CHEMO INJECTION (PF) 1 GM **50 MG/ML**: 40.0000 mg | INTRAMUSCULAR | Status: DC

## 2017-12-17 MED ORDER — METOCLOPRAMIDE HCL 5 MG/ML IJ SOLN: 5.0000 mg | Freq: Three times a day (TID) | INTRAMUSCULAR | Status: DC | PRN

## 2017-12-17 MED ORDER — ROCURONIUM BROMIDE 100 MG/10ML IV SOLN
INTRAVENOUS | Status: DC | PRN
  Administered 2017-12-17: 40 mg via INTRAVENOUS

## 2017-12-17 MED ORDER — HYDROCODONE-ACETAMINOPHEN 5-325 MG PO TABS: 1.0000 | ORAL_TABLET | ORAL | 0 refills | Status: DC | PRN

## 2017-12-17 MED ORDER — RALOXIFENE HCL 60 MG PO TABS
60.0000 mg | ORAL_TABLET | Freq: Every day | ORAL | Status: DC
  Administered 2017-12-17: 60 mg via ORAL
  Filled 2017-12-17 (×2): qty 1

## 2017-12-17 MED ORDER — FENTANYL CITRATE (PF) 100 MCG/2ML IJ SOLN
100.0000 ug | Freq: Once | INTRAMUSCULAR | Status: AC
  Administered 2017-12-17: 100 ug via INTRAVENOUS

## 2017-12-17 MED ORDER — 0.9 % SODIUM CHLORIDE (POUR BTL) OPTIME
TOPICAL | Status: DC | PRN
  Administered 2017-12-17: 1000 mL

## 2017-12-17 MED ORDER — FENTANYL CITRATE (PF) 100 MCG/2ML IJ SOLN: 25.0000 ug | INTRAMUSCULAR | Status: DC | PRN

## 2017-12-17 MED ORDER — LIDOCAINE HCL (CARDIAC) PF 100 MG/5ML IV SOSY
PREFILLED_SYRINGE | INTRAVENOUS | Status: DC | PRN
  Administered 2017-12-17: 40 mg via INTRAVENOUS

## 2017-12-17 MED ORDER — MORPHINE SULFATE (PF) 4 MG/ML IV SOLN: 0.5000 mg | INTRAVENOUS | Status: DC | PRN

## 2017-12-17 MED ORDER — MIDAZOLAM HCL 2 MG/2ML IJ SOLN
INTRAMUSCULAR | Status: AC
  Filled 2017-12-17: qty 2

## 2017-12-17 MED ORDER — DEXAMETHASONE SODIUM PHOSPHATE 10 MG/ML IJ SOLN
INTRAMUSCULAR | Status: DC | PRN
  Administered 2017-12-17: 10 mg via INTRAVENOUS

## 2017-12-17 MED ORDER — FENTANYL CITRATE (PF) 100 MCG/2ML IJ SOLN
INTRAMUSCULAR | Status: AC
  Administered 2017-12-17: 100 ug via INTRAVENOUS
  Filled 2017-12-17: qty 2

## 2017-12-17 MED ORDER — LACTATED RINGERS IV SOLN
INTRAVENOUS | Status: DC
  Administered 2017-12-17: 09:00:00 via INTRAVENOUS

## 2017-12-17 MED ORDER — ATORVASTATIN CALCIUM 20 MG PO TABS
20.0000 mg | ORAL_TABLET | Freq: Every day | ORAL | Status: DC
  Administered 2017-12-17: 20 mg via ORAL
  Filled 2017-12-17: qty 1

## 2017-12-17 MED ORDER — OXYMETAZOLINE HCL 0.05 % NA SOLN: 2.0000 | Freq: Two times a day (BID) | NASAL | Status: DC | PRN

## 2017-12-17 MED ORDER — CLOTRIMAZOLE 1 % EX CREA: TOPICAL_CREAM | Freq: Two times a day (BID) | CUTANEOUS | Status: DC | PRN

## 2017-12-17 MED ORDER — DOCUSATE SODIUM 100 MG PO CAPS
100.0000 mg | ORAL_CAPSULE | Freq: Two times a day (BID) | ORAL | Status: DC
  Filled 2017-12-17: qty 1

## 2017-12-17 MED ORDER — PANTOPRAZOLE SODIUM 40 MG PO TBEC: 40.0000 mg | DELAYED_RELEASE_TABLET | Freq: Every day | ORAL | Status: DC

## 2017-12-17 MED ORDER — PROPOFOL 10 MG/ML IV BOLUS
INTRAVENOUS | Status: AC
  Filled 2017-12-17: qty 20

## 2017-12-17 MED ORDER — ACETAMINOPHEN 325 MG PO TABS: 325.0000 mg | ORAL_TABLET | Freq: Four times a day (QID) | ORAL | Status: DC | PRN

## 2017-12-17 MED ORDER — CLOPIDOGREL BISULFATE 75 MG PO TABS: 75.0000 mg | ORAL_TABLET | Freq: Every day | ORAL | Status: DC

## 2017-12-17 MED ORDER — PHENYLEPHRINE HCL 10 MG/ML IJ SOLN
INTRAVENOUS | Status: DC | PRN
  Administered 2017-12-17: 25 ug/min via INTRAVENOUS

## 2017-12-17 MED ORDER — CHLORHEXIDINE GLUCONATE 4 % EX LIQD: 60.0000 mL | Freq: Once | CUTANEOUS | Status: DC

## 2017-12-17 MED ORDER — HYDROCODONE-ACETAMINOPHEN 7.5-325 MG PO TABS: 1.0000 | ORAL_TABLET | ORAL | Status: DC | PRN

## 2017-12-17 MED ORDER — ACETAMINOPHEN 500 MG PO TABS
500.0000 mg | ORAL_TABLET | Freq: Four times a day (QID) | ORAL | Status: DC
  Administered 2017-12-17: 500 mg via ORAL
  Filled 2017-12-17 (×2): qty 1

## 2017-12-17 MED ORDER — CLONIDINE HCL (ANALGESIA) 100 MCG/ML EP SOLN
EPIDURAL | Status: DC | PRN
  Administered 2017-12-17: 50 ug

## 2017-12-17 MED ORDER — BUPIVACAINE-EPINEPHRINE (PF) 0.5% -1:200000 IJ SOLN
INTRAMUSCULAR | Status: DC | PRN
  Administered 2017-12-17: 25 mL via PERINEURAL

## 2017-12-17 MED ORDER — CLINDAMYCIN PHOSPHATE 900 MG/50ML IV SOLN
INTRAVENOUS | Status: AC
  Filled 2017-12-17: qty 50

## 2017-12-17 MED ORDER — CLINDAMYCIN PHOSPHATE 900 MG/50ML IV SOLN
900.0000 mg | INTRAVENOUS | Status: AC
  Administered 2017-12-17: 900 mg via INTRAVENOUS

## 2017-12-17 MED ORDER — ACETAMINOPHEN 500 MG PO TABS: 1000.0000 mg | ORAL_TABLET | Freq: Two times a day (BID) | ORAL | Status: DC | PRN

## 2017-12-17 SURGICAL SUPPLY — 69 items
AID PSTN UNV HD RSTRNT DISP (MISCELLANEOUS) ×1
BIT DRILL 170X2.5X (BIT) IMPLANT
BIT DRILL 5/64X5 DISP (BIT) ×3 IMPLANT
BIT DRL 170X2.5X (BIT)
BLADE SAG 18X100X1.27 (BLADE) ×3 IMPLANT
CAPT SHLDR REVTOTAL 1 ×2 IMPLANT
CLOSURE STERI-STRIP 1/2X4 (GAUZE/BANDAGES/DRESSINGS) ×1
CLOSURE WOUND 1/2 X4 (GAUZE/BANDAGES/DRESSINGS) ×1
CLSR STERI-STRIP ANTIMIC 1/2X4 (GAUZE/BANDAGES/DRESSINGS) ×1 IMPLANT
COVER SURGICAL LIGHT HANDLE (MISCELLANEOUS) ×3 IMPLANT
DRAPE INCISE IOBAN 66X45 STRL (DRAPES) ×3 IMPLANT
DRAPE ORTHO SPLIT 77X108 STRL (DRAPES) ×6
DRAPE SURG ORHT 6 SPLT 77X108 (DRAPES) ×2 IMPLANT
DRAPE U-SHAPE 47X51 STRL (DRAPES) ×3 IMPLANT
DRILL 2.5 (BIT)
DRSG ADAPTIC 3X8 NADH LF (GAUZE/BANDAGES/DRESSINGS) ×3 IMPLANT
DRSG PAD ABDOMINAL 8X10 ST (GAUZE/BANDAGES/DRESSINGS) ×3 IMPLANT
DURAPREP 26ML APPLICATOR (WOUND CARE) ×3 IMPLANT
ELECT BLADE 4.0 EZ CLEAN MEGAD (MISCELLANEOUS) ×3
ELECT NDL TIP 2.8 STRL (NEEDLE) ×1 IMPLANT
ELECT NEEDLE TIP 2.8 STRL (NEEDLE) ×3 IMPLANT
ELECT REM PT RETURN 9FT ADLT (ELECTROSURGICAL) ×3
ELECTRODE BLDE 4.0 EZ CLN MEGD (MISCELLANEOUS) ×1 IMPLANT
ELECTRODE REM PT RTRN 9FT ADLT (ELECTROSURGICAL) ×1 IMPLANT
GAUZE SPONGE 4X4 12PLY STRL (GAUZE/BANDAGES/DRESSINGS) ×3 IMPLANT
GAUZE SPONGE 4X4 12PLY STRL LF (GAUZE/BANDAGES/DRESSINGS) ×2 IMPLANT
GLOVE BIOGEL PI ORTHO PRO 7.5 (GLOVE) ×2
GLOVE BIOGEL PI ORTHO PRO SZ8 (GLOVE) ×2
GLOVE ORTHO TXT STRL SZ7.5 (GLOVE) ×3 IMPLANT
GLOVE PI ORTHO PRO STRL 7.5 (GLOVE) ×1 IMPLANT
GLOVE PI ORTHO PRO STRL SZ8 (GLOVE) ×1 IMPLANT
GLOVE SURG ORTHO 8.5 STRL (GLOVE) ×3 IMPLANT
GOWN STRL REUS W/ TWL LRG LVL3 (GOWN DISPOSABLE) ×1 IMPLANT
GOWN STRL REUS W/ TWL XL LVL3 (GOWN DISPOSABLE) ×2 IMPLANT
GOWN STRL REUS W/TWL LRG LVL3 (GOWN DISPOSABLE) ×3
GOWN STRL REUS W/TWL XL LVL3 (GOWN DISPOSABLE) ×6
KIT BASIN OR (CUSTOM PROCEDURE TRAY) ×3 IMPLANT
KIT TURNOVER KIT B (KITS) ×3 IMPLANT
MANIFOLD NEPTUNE II (INSTRUMENTS) ×3 IMPLANT
NDL 1/2 CIR MAYO (NEEDLE) ×1 IMPLANT
NDL HYPO 25GX1X1/2 BEV (NEEDLE) ×1 IMPLANT
NEEDLE 1/2 CIR MAYO (NEEDLE) ×3 IMPLANT
NEEDLE HYPO 25GX1X1/2 BEV (NEEDLE) ×3 IMPLANT
NS IRRIG 1000ML POUR BTL (IV SOLUTION) ×3 IMPLANT
PACK SHOULDER (CUSTOM PROCEDURE TRAY) ×3 IMPLANT
PAD ABD 8X10 STRL (GAUZE/BANDAGES/DRESSINGS) ×2 IMPLANT
PAD ARMBOARD 7.5X6 YLW CONV (MISCELLANEOUS) ×6 IMPLANT
PIN GUIDE 1.2 (PIN) IMPLANT
PIN GUIDE GLENOPHERE 1.5MX300M (PIN) IMPLANT
PIN METAGLENE 2.5 (PIN) IMPLANT
RESTRAINT HEAD UNIVERSAL NS (MISCELLANEOUS) ×3 IMPLANT
SLING ARM IMMOBILIZER LRG (SOFTGOODS) ×2 IMPLANT
SPONGE LAP 18X18 X RAY DECT (DISPOSABLE) IMPLANT
SPONGE LAP 4X18 X RAY DECT (DISPOSABLE) ×3 IMPLANT
STRIP CLOSURE SKIN 1/2X4 (GAUZE/BANDAGES/DRESSINGS) ×2 IMPLANT
SUCTION FRAZIER HANDLE 10FR (MISCELLANEOUS) ×2
SUCTION TUBE FRAZIER 10FR DISP (MISCELLANEOUS) ×1 IMPLANT
SUT FIBERWIRE #2 38 T-5 BLUE (SUTURE)
SUT MNCRL AB 4-0 PS2 18 (SUTURE) ×3 IMPLANT
SUT VIC AB 0 CT2 27 (SUTURE) ×3 IMPLANT
SUT VIC AB 2-0 CT1 27 (SUTURE) ×3
SUT VIC AB 2-0 CT1 TAPERPNT 27 (SUTURE) ×1 IMPLANT
SUT VICRYL 0 CT 1 36IN (SUTURE) ×3 IMPLANT
SUTURE FIBERWR #2 38 T-5 BLUE (SUTURE) IMPLANT
SYR CONTROL 10ML LL (SYRINGE) ×3 IMPLANT
TOWEL OR 17X24 6PK STRL BLUE (TOWEL DISPOSABLE) ×3 IMPLANT
TOWEL OR 17X26 10 PK STRL BLUE (TOWEL DISPOSABLE) ×3 IMPLANT
TOWER CARTRIDGE SMART MIX (DISPOSABLE) IMPLANT
YANKAUER SUCT BULB TIP NO VENT (SUCTIONS) ×3 IMPLANT

## 2017-12-17 NOTE — Anesthesia Procedure Notes (Signed)
Procedure Name: Intubation Date/Time: 12/17/2017 10:37 AM Performed by: Kyung Rudd, CRNA Pre-anesthesia Checklist: Patient identified, Emergency Drugs available, Suction available and Patient being monitored Patient Re-evaluated:Patient Re-evaluated prior to induction Oxygen Delivery Method: Circle system utilized Preoxygenation: Pre-oxygenation with 100% oxygen Induction Type: IV induction Ventilation: Mask ventilation without difficulty Laryngoscope Size: Mac and 3 Grade View: Grade II Tube type: Oral Tube size: 7.0 mm Number of attempts: 1 Airway Equipment and Method: Stylet Placement Confirmation: ETT inserted through vocal cords under direct vision,  positive ETCO2 and breath sounds checked- equal and bilateral Secured at: 21 cm Tube secured with: Tape Dental Injury: Teeth and Oropharynx as per pre-operative assessment

## 2017-12-17 NOTE — Transfer of Care (Signed)
Immediate Anesthesia Transfer of Care Note  Patient: [name] [name]  Procedure(s) Performed: LEFT REVERSE SHOULDER ARTHROPLASTY (Left Shoulder)  Patient Location: PACU  Anesthesia Type:GA combined with regional for post-op pain  Level of Consciousness: awake, alert  and oriented  Airway & Oxygen Therapy: Patient Spontanous Breathing and Patient connected to nasal cannula oxygen  Post-op Assessment: Report given to RN and Post -op Vital signs reviewed and stable  Post vital signs: Reviewed and stable  Last Vitals:  Vitals Value Taken Time  BP 119/59 12/17/2017 12:16 PM  Temp    Pulse 76 12/17/2017 12:20 PM  Resp 17 12/17/2017 12:20 PM  SpO2 100 % 12/17/2017 12:20 PM  Vitals shown include unvalidated device data.  Last Pain:  Vitals:   12/17/17 0945  TempSrc:   PainSc: 0-No pain         Complications: No apparent anesthesia complications

## 2017-12-17 NOTE — Anesthesia Postprocedure Evaluation (Signed)
Anesthesia Post Note  Patient: [name] [name]  Procedure(s) Performed: LEFT REVERSE SHOULDER ARTHROPLASTY (Left Shoulder)     Patient location during evaluation: PACU Anesthesia Type: General Level of consciousness: awake and alert Pain management: pain level controlled Vital Signs Assessment: post-procedure vital signs reviewed and stable Respiratory status: spontaneous breathing, nonlabored ventilation, respiratory function stable and patient connected to nasal cannula oxygen Cardiovascular status: blood pressure returned to baseline and stable Postop Assessment: no apparent nausea or vomiting Anesthetic complications: no    Last Vitals:  Vitals:   12/17/17 1217 12/17/17 1245  BP:  (!) 86/42  Pulse:  72  Resp:  14  Temp: (!) 36.1 C   SpO2:  99%    Last Pain:  Vitals:   12/17/17 1245  TempSrc:   PainSc: 0-No pain                 Tiajuana Amass

## 2017-12-17 NOTE — Anesthesia Procedure Notes (Signed)
Anesthesia Regional Block: Interscalene brachial plexus block   Pre-Anesthetic Checklist: ,, timeout performed, Correct Patient, Correct Site, Correct Laterality, Correct Procedure, Correct Position, site marked, Risks and benefits discussed,  Surgical consent,  Pre-op evaluation,  At surgeon's request and post-op pain management  Laterality: Left  Prep: chloraprep       Needles:  Injection technique: Single-shot  Needle Type: Echogenic Stimulator Needle     Needle Length: 9cm  Needle Gauge: 21     Additional Needles:   Procedures:, nerve stimulator,,, ultrasound used (permanent image in chart),,,,   Nerve Stimulator or Paresthesia:  Response: deltoid, 0.5 mA,   Additional Responses:   Narrative:  Start time: 12/17/2017 9:26 AM End time: 12/17/2017 9:33 AM Injection made incrementally with aspirations every 5 mL.  Performed by: Personally  Anesthesiologist: Suzette Battiest, MD

## 2017-12-17 NOTE — Interval H&P Note (Signed)
History and Physical Interval Note:  12/17/2017 10:00 AM  [name] [name]  has presented today for surgery, with the diagnosis of Left shoulder end stage osteoarthritis  The various methods of treatment have been discussed with the patient and family. After consideration of risks, benefits and other options for treatment, the patient has consented to  Procedure(s): LEFT REVERSE SHOULDER ARTHROPLASTY (Left) as a surgical intervention .  The patient's history has been reviewed, patient examined, no change in status, stable for surgery.  I have reviewed the patient's chart and labs.  Questions were answered to the patient's satisfaction.     Jaxzen Vanhorn,STEVEN R

## 2017-12-17 NOTE — Brief Op Note (Signed)
12/17/2017  12:41 PM  PATIENT:  [name] [name]  77 y.o. female  PRE-OPERATIVE DIAGNOSIS:  Left shoulder end stage osteoarthritis  POST-OPERATIVE DIAGNOSIS:  Left shoulder end stage osteoarthritis  PROCEDURE:  Procedure(s): LEFT REVERSE SHOULDER ARTHROPLASTY (Left) [place]  SURGEON:  Surgeon(s) and Role:    Netta Cedars, MD - Primary  PHYSICIAN ASSISTANT:   ASSISTANTS: Ventura Bruns, PA-C   ANESTHESIA:   regional and general  EBL:  150 mL   BLOOD ADMINISTERED:none  DRAINS: none   LOCAL MEDICATIONS USED:  MARCAINE     SPECIMEN:  No Specimen  DISPOSITION OF SPECIMEN:  N/A  COUNTS:  YES  TOURNIQUET:  * No tourniquets in log *  DICTATION: .Other Dictation: Dictation Number [id number]  PLAN OF CARE: Admit to inpatient   PATIENT DISPOSITION:  PACU - hemodynamically stable.   Delay start of Pharmacological VTE agent (>24hrs) due to surgical blood loss or risk of bleeding: not applicable

## 2017-12-17 NOTE — Anesthesia Preprocedure Evaluation (Addendum)
Anesthesia Evaluation  Patient identified by MRN, date of birth, ID band Patient awake    Reviewed: Allergy & Precautions, NPO status , Patient's Chart, lab work & pertinent test results, reviewed documented beta blocker date and time   History of Anesthesia Complications (+) PONV  Airway Mallampati: II  TM Distance: >3 FB Neck ROM: Full    Dental  (+) Dental Advisory Given, Teeth Intact, Partial Upper   Pulmonary neg pulmonary ROS,    breath sounds clear to auscultation       Cardiovascular hypertension, Pt. on medications and Pt. on home beta blockers + CAD, + Past MI and + Cardiac Stents   Rhythm:Regular Rate:Normal  Echo 09/07/17:  - Left ventricle: The cavity size was normal. Wall thickness was increased in a pattern of mild LVH. Systolic function was normal. The estimated ejection fraction was in the range of 55% to 60%. Wall motion was normal; there were no regional wall motion abnormalities. Doppler parameters are consistent with abnormal left ventricular relaxation (grade 1 diastolic dysfunction).Doppler parameters are consistent with high ventricular fillingpressure. - Aortic valve: There was mild regurgitation. - Mitral valve: Calcified annulus. - Impressions: Normal LV systolic function; mild diastolic dysfunction; mildLVH; sclerotic aortic valve with mild AI.     Neuro/Psych  Headaches, Anxiety Depression    GI/Hepatic Neg liver ROS, PUD, GERD  ,  Endo/Other  negative endocrine ROS  Renal/GU negative Renal ROS     Musculoskeletal  (+) Arthritis ,   Abdominal   Peds  Hematology  (+) anemia ,   Anesthesia Other Findings   Reproductive/Obstetrics                            Lab Results  Component Value Date   WBC 10.0 12/09/2017   HGB 10.0 (L) 12/09/2017   HCT 32.7 (L) 12/09/2017   MCV 95.3 12/09/2017   PLT 279 12/09/2017   Lab Results  Component Value Date   CREATININE  1.12 (H) 12/09/2017   BUN 18 12/09/2017   NA 136 12/09/2017   K 4.0 12/09/2017   CL 106 12/09/2017   CO2 19 (L) 12/09/2017    Anesthesia Physical Anesthesia Plan  ASA: III  Anesthesia Plan: General   Post-op Pain Management:  Regional for Post-op pain   Induction: Intravenous  PONV Risk Score and Plan: 4 or greater and Ondansetron, Dexamethasone and Treatment may vary due to age or medical condition  Airway Management Planned: Oral ETT  Additional Equipment:   Intra-op Plan:   Post-operative Plan: Extubation in OR  Informed Consent: I have reviewed the patients History and Physical, chart, labs and discussed the procedure including the risks, benefits and alternatives for the proposed anesthesia with the patient or authorized representative who has indicated his/her understanding and acceptance.   Dental advisory given  Plan Discussed with:   Anesthesia Plan Comments:         Anesthesia Quick Evaluation

## 2017-12-17 NOTE — Discharge Instructions (Signed)
Keep a pillow or blanket propped behind the left arm to keep the arm across your waist.  Ok to remove the sling in the home while seated or relaxing, use the sling when up and walking or out of the home  Ice to the shoulder as much as you can.  Keep the incision clean and dry for one week, then ok to get it wet in the shower.  Do exercises 3-4 times daily as instructed.  Follow up in two weeks in the office, call [phone] for appt

## 2017-12-18 LAB — BASIC METABOLIC PANEL
ANION GAP: 9 (ref 5–15)
BUN: 18 mg/dL (ref 6–20)
CHLORIDE: 99 mmol/L — AB (ref 101–111)
CO2: 23 mmol/L (ref 22–32)
Calcium: 7.9 mg/dL — ABNORMAL LOW (ref 8.9–10.3)
Creatinine, Ser: 1.03 mg/dL — ABNORMAL HIGH (ref 0.44–1.00)
GFR, EST AFRICAN AMERICAN: 59 mL/min — AB (ref >60)
GFR, EST NON AFRICAN AMERICAN: 51 mL/min — AB (ref >60)
Glucose, Bld: 174 mg/dL — ABNORMAL HIGH (ref 65–99)
POTASSIUM: 4.1 mmol/L (ref 3.5–5.1)
SODIUM: 131 mmol/L — AB (ref 135–145)

## 2017-12-18 LAB — HEMOGLOBIN AND HEMATOCRIT, BLOOD
HEMATOCRIT: 28 % — AB (ref 36.0–46.0)
Hemoglobin: 8.7 g/dL — ABNORMAL LOW (ref 12.0–15.0)

## 2017-12-18 NOTE — Discharge Summary (Signed)
Orthopedic Discharge Summary        Physician Discharge Summary  Patient ID: [name] [name] MRN: [id number] DOB/AGE: [birth year] 77 y.o.  Admit date: 12/17/2017 Discharge date: 12/18/2017   Procedures:  Procedure(s) (LRB): LEFT REVERSE SHOULDER ARTHROPLASTY (Left)  Attending Physician:  Dr. Esmond Plants  Admission Diagnoses:  Left shoulder cuff arthropathy  Discharge Diagnoses:  Left shoulder cuff arthropathy   Past Medical History:  Diagnosis Date  . Anemia    of chronic disease  . Anxiety   . Arthritis    "psoratic arthritis; new dx" (09/11/2016)  . CAD S/P percutaneous coronary angioplasty 08/2017   mLCx 90% -> 0% (2.75 mm x 16 mm) Synergy DES PCI Ostial D1 70% (not PTCA target). Moderate ostial and mD2.  LAD tandem 50% stenoses w/ negative FFR (0.86)  . Cancer ([place])    skin cancer on leg  . Chronic lower back pain   . Complication of anesthesia 1980 and 1982   trouble waking up after breast biopsy, many surgeries since no problem  . Depression   . Dyspnea    w/ exertion  . Fibrocystic breast   . GERD (gastroesophageal reflux disease)   . Headache    "monthly" (09/11/2016)  . High cholesterol   . History of blood transfusion    "several since age 69; I've had them after most of my surgeries" (09/11/2016)  . Hx of skin cancer, basal cell 2013 and 2012   right inner, lower leg; several scattered around my body  . Hypertension   . Iron deficiency anemia    "I've had an iron infusion"  . Myocardial infarction [facility])    "doctor said there are signs I've had a heart attack; don't know when" (09/11/2016)  . Osteoarthritis   . Osteopenia   . Peptic ulcer disease    H pylori  . PMR (polymyalgia rheumatica) (HCC)   . PONV (postoperative nausea and vomiting)   . Psoriasis     PCP: Abner Greenspan, MD   Discharged Condition: good  Hospital Course:  Patient underwent the above stated procedure on 12/17/2017. Patient tolerated the procedure well and brought to the  recovery room in good condition and subsequently to the floor. Patient had an uncomplicated hospital course and was stable for discharge.   Disposition: Discharge disposition: 01-Home or Self Care      with follow up in 2 weeks   Follow-up Information    Netta Cedars, MD. Call in 2 weeks.   Specialty:  Orthopedic Surgery Why:  [phone] Contact information: [address] [place] [address] [phone]           Discharge Instructions    Call MD / Call 911   Complete by:  As directed    If you experience chest pain or shortness of breath, CALL 911 and be transported to the hospital emergency room.  If you develope a fever above 101 F, pus (white drainage) or increased drainage or redness at the wound, or calf pain, call your surgeon's office.   Constipation Prevention   Complete by:  As directed    Drink plenty of fluids.  Prune juice may be helpful.  You may use a stool softener, such as Colace (over the counter) 100 mg twice a day.  Use MiraLax (over the counter) for constipation as needed.   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   Ace Inhibitors    cough   Oxaprozin    GI upset    Pregabalin Swelling   SWELLING REACTION UNSPECIFIED    Amoxicillin-pot Clavulanate Nausea And Vomiting   gi upset Has patient had a PCN reaction causing immediate rash, facial/tongue/throat swelling, SOB or lightheadedness with hypotension: No Has patient had a PCN reaction causing severe rash involving mucus membranes or skin necrosis: No Has patient had a PCN reaction that required hospitalization No Has patient had a PCN reaction occurring within the last 10 years: No If all of the above answers are "NO", then may proceed with Cephalosporin use.       Medication List    STOP taking these medications   amoxicillin 500 MG capsule Commonly known as:  AMOXIL   busPIRone 15 MG tablet Commonly known as:  BUSPAR   losartan-hydrochlorothiazide 50-12.5 MG tablet Commonly known as:  HYZAAR     TAKE these medications   acetaminophen 500 MG tablet Commonly known as:  TYLENOL Take 1,000 mg by mouth 2 (two) times daily as needed for moderate pain or headache.   ALKA-SELTZER PLUS SINUS PO Take 2 tablets by mouth daily as needed (sinuses).   atorvastatin 20 MG tablet Commonly known as:  LIPITOR TAKE 1 TABLET BY MOUTH EVERY DAY What changed:    how much to take  how to take this  when to take this   BLUE-EMU MAXIMUM STRENGTH EX Apply 1 application topically daily as needed (arthritis).   clopidogrel 75 MG tablet Commonly known as:  PLAVIX TAKE 1 TABLET (75 MG TOTAL) BY MOUTH DAILY WITH BREAKFAST.   clotrimazole-betamethasone cream Commonly known as:  LOTRISONE APPLY TO AFFECTED AREA TWICE A DAY UNTIL CLEAR What changed:    how much to take  how to take this  when to take this  reasons to take this  additional instructions   ergocalciferol 50000 units capsule Commonly known as:  VITAMIN D2 Take 1 capsule (50,000 Units total) by mouth once a week. For 12 weeks   folic acid 1 MG tablet Commonly known as:  FOLVITE Take 1 mg by mouth daily.   hydrochlorothiazide 12.5 MG tablet Commonly known as:  HYDRODIURIL Take 12.5 mg by mouth daily.   HYDROcodone-acetaminophen 5-325 MG tablet Commonly known as:  NORCO/VICODIN Take 1-2 tablets by mouth every 4 (four) hours as needed for moderate pain or severe pain. What changed:    how much to take  when to take this  reasons to take this   hyoscyamine 0.125 MG tablet Commonly known as:  LEVSIN, ANASPAZ TAKE 1 TABLET BY MOUTH AS NEEDED STOMACH SPASMS   isosorbide mononitrate 30 MG 24 hr tablet Commonly known as:  IMDUR Take 1 tablet (30 mg total) by mouth  daily.   losartan 50 MG tablet Commonly known as:  COZAAR Take 50 mg by mouth daily.   Melatonin 3 MG Caps Take 6 mg by mouth at bedtime.   methotrexate 1 g injection Commonly known as:  50 mg/ml Inject 40 mg into the vein once a week.   metoprolol tartrate 25 MG tablet Commonly known as:  LOPRESSOR Take 1 tablet (25 mg total) by mouth 2 (two) times daily.   nitroGLYCERIN 0.4 MG SL tablet Commonly known as:  NITROSTAT Place 1 tablet (0.4  mg total) under the tongue every 5 (five) minutes as needed for chest pain.   oxymetazoline 0.05 % nasal spray Commonly known as:  AFRIN Place 2 sprays into the nose 2 (two) times daily as needed for congestion.   pantoprazole 40 MG tablet Commonly known as:  PROTONIX TAKE 1 TABLET (40 MG TOTAL) BY MOUTH DAILY.   predniSONE 5 MG tablet Commonly known as:  DELTASONE Take 5 mg by mouth daily with breakfast.   raloxifene 60 MG tablet Commonly known as:  EVISTA Take 1 tablet (60 mg total) by mouth daily.         Signed: Ventura Bruns 12/18/2017, 8:31 AM  [facility] is now [employer] [address] [place], [place], [place] [address] Phone: [place]

## 2017-12-18 NOTE — Progress Notes (Signed)
Patient is discharged from room 3C05 at this time. Alert and in stable condition. IV site d/c'd and instructions read to patient and daughter with understanding verbalized. Left unit with all belongings at side.

## 2017-12-18 NOTE — Op Note (Signed)
NAMESHANIQUE, ASLINGER               ACCOUNT NO.:  [id number]  MEDICAL RECORD NO.:  [id number]  LOCATION:  [facility]                         FACILITY:  [facility]  PHYSICIAN:  Doran Heater. Veverly Fells, M.D. 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  PROCEDURE PERFORMED:  Left reverse total shoulder arthroplasty using DePuy Delta Xtend prosthesis.  ATTENDING SURGEON:  Doran Heater. Veverly Fells, M.D.  ASSISTANT:  Abbott Pao. Dixon, PA, who was scrubbed during the entire procedure and necessary for satisfactory completion of surgery.  General anesthesia was used plus interscalene block.  Estimated blood loss was 150 mL.  FLUID REPLACEMENT:  1500 mL crystalloid.  INSTRUMENT COUNTS:  Correct.  There were no complications.  Perioperative antibiotics were given.  INDICATIONS:  The patient is a 77 year old female with worsening left shoulder pain and dysfunction secondary to end-stage arthritis and rotator cuff insufficiency.  The patient has had progressive pain and declining function in her shoulder and now presents for reverse shoulder replacement to restore function and eliminate pain.  Informed consent obtained.  DESCRIPTION OF THE PROCEDURE:  After an adequate level of anesthesia was achieved, the patient was positioned in a modified beach-chair position. Left shoulder correctly identified and sterilely prepped and draped in the usual manner.  After correct identification of the patient and site, time-out performed.  We initiated surgery using standard deltopectoral incision starting at the coracoid process extending down to the anterior humerus, dissection taken down through subcutaneous tissues using Bovie. We identified  the cephalic vein, took it laterally with the deltoid, pectoralis taken medially.  Conjoined tendon identified and retracted medially.  We tenodesed the biceps in situ with 0 Vicryl figure-of-eight suture x2.  We then released the subscap remnant and subperiosteally off the lesser tuberosity and tagged for retraction to protect the axillary nerve.  I did a progressive release of the inferior capsule externally rotating and extended the shoulder, delivered the humerus out of the wound.  We entered the proximal humerus which was quite deformed with no longer a round humeral head but a flattened humeral head.  We entered the proximal humerus with a 6 mm reamer, reamed up to a size #10.  We then placed our 10 mm guide for resecting the head, which we resected 10 degrees retroversion with the oscillating saw.  We then removed the excess osteophytes with a rongeur.  We then subluxed the humerus posteriorly and did a 360-degree capsule and labral excision and protecting the axillary nerve the entire time.  We had excellent exposure of the glenoid face.  We identified the center point for our guide pin which we placed with the drill.  We then reamed for the metaglene baseplate and then drilled our central peg hole.  We did our peripheral hand reaming and then placed our baseplate into position.  We then secured inferiorly with a 42 screw that was locked and a 36 locked at the base of the coracoid, then 18 nonlocked anterior and posteriorly.  We had great bony support for the baseplate.  Next, we took a #38 eccentric glenosphere and connected that to the baseplate and screwed that as well as impacting and then screwing again.  We had it seated nicely.  We checked with a finger sweep to make sure that nothing was incarcerated or caught in between the baseplate and the glenosphere. Next, we went ahead and completed our preparation on the humeral side with the modular reaming apparatus to ream for the  size #1 left metaphysis.  We then took our 10 stem with size #1 metaphysis, set on the 0 setting, and placed in 10 degrees of retroversion, impacted that in position of the humerus, reduced the shoulder with a 38 +3 poly and had excellent stability and balance and mobility with no impingement. We irrigated thoroughly, removed the trial components, and then, we selected the HA-coated press-fit stem and used impaction grafting technique to impact the stem and the metaphyseal portion size 1 left set on the 0 setting and placed in 10 degrees retroversion and impacted that firmly into the bone.  We had good purchase.  We selected the real 38, +3 poly and impacted that on the humeral side, and we reduced the shoulder, had a nice little snap as we reduced.  No gapping with inferior pole, with external rotation, and the conjoined was appropriately tensioned.  Axillary nerve was not under too much tension. We irrigated it thoroughly and then closed.  After resecting the remnant of the subscap, we closed the deltopectoral interval with 0 Vicryl suture followed by 2-0 Vicryl for subcutaneous closure and 4-0 Monocryl for skin.  Steri-Strips were applied followed by sterile dressing.  The patient tolerated the surgery well.     Doran Heater. Veverly Fells, M.D.     SRN/MEDQ  D:  12/17/2017  T:  12/18/2017  Job:  [id number]

## 2017-12-18 NOTE — Progress Notes (Signed)
   Subjective: 1 Day Post-Op Procedure(s) (LRB): LEFT REVERSE SHOULDER ARTHROPLASTY (Left)  Pt c/o mild soreness in the shoulder Denies any new symptoms or issues Otherwise ready for d/c home Patient reports pain as mild.  Objective:   VITALS:   Vitals:   12/18/17 0426 12/18/17 0740  BP: (!) 154/85 (!) 119/58  Pulse: 98 74  Resp: 16 18  Temp: (!) 97.5 F (36.4 C) 97.7 F (36.5 C)  SpO2: 97% 97%    Left shoulder incision healing well nv intact distally No rashes or edema distally  LABS Recent Labs    12/18/17 0804  HGB 8.7*  HCT 28.0*    No results for input(s): NA, K, BUN, CREATININE, GLUCOSE in the last 72 hours.   Assessment/Plan: 1 Day Post-Op Procedure(s) (LRB): LEFT REVERSE SHOULDER ARTHROPLASTY (Left) D/c home today Gentle activity as tolerated Pulmonary toilet F/u in 2 weeks in the office    Brad Vicy Medico PA-C, [place] is now [employer] [facility] [address], [place], [place], [place] [address] Phone: [phone] [email or web address]  [employer]

## 2017-12-18 NOTE — Evaluation (Signed)
Occupational Therapy Evaluation and Discharge Patient Details Name: [name] [name] MRN: [id number] DOB: 1940-12-23 Today's Date: 12/18/2017    History of Present Illness Pt is a 77 y.o. female s/p L reverse total shoulder arthroplasty. PMH significant for but not limited to: anemia, anxiety, arthritis, CAD s/p percutaneous coronary angioplasty, chronic lower back pain, depression, fibrocystic breast, headache, high cholesterol, history of blood transfusion, hypertension, myocardial infarction, osteoarthritis, osteopenia, peptic ulcer disease, polymyalgia rheumatica, psoriasis. Past surgical history including bilateral hip arthroplasties, bilateral knee arthroscopies, and multiple back surgeries.   Clinical Impression   PTA, pt reports requiring assistance for LB dressing tasks but was otherwise independent with ADL and functional mobility. She currently presents with decreased functional use of RUE within active shoulder protocol parameters impacting her ability to participate in ADL at [facility]. Pt and daughter educated concerning compensatory ADL strategies, sling wear schedule, safe showering strategies, and use of 3-in-1 over commode so that pt will be able to power up from commode with R UE support. Pt additionally educated concerning HEP as detailed below. Pt and daughter demonstrate understanding of all topics and pt will have assistance from her husband and daughter post-acute D/C. She currently requires mod assist for LB dressing and min assist for UB ADL with supervision for toileting tasks. They will be able to provide the necessary assistance. No further acute OT needs identified. OT will sign off.     Follow Up Recommendations  Supervision/Assistance - 24 hour;Follow surgeon's recommendation for DC plan and follow-up therapies    Equipment Recommendations  None recommended by OT    Recommendations for Other Services       Precautions / Restrictions Precautions Precautions:  Shoulder Type of Shoulder Precautions: Active protocol: Sling for comfort and sleep, NWB L UE, AROM L elbow/wrist/hand to tolerance, AROM/PROM L shoulder (forward flexion: 0-90, abduction: 0-60, external rotation: 0-30) Shoulder Interventions: Shoulder sling/immobilizer;For comfort(for sleep) Precaution Booklet Issued: Yes (comment) Precaution Comments: Educated pt and daughter concerning active shoulder protocol. Dr. Veverly Fells present during session and adding L shoulder lap slides and pendulums verbally to HEP and thus provided handout for these activities as well.  Required Braces or Orthoses: Sling(L shoulder) Restrictions Weight Bearing Restrictions: No      Mobility Bed Mobility Overal bed mobility: Modified Independent             General bed mobility comments: Able to complete without physical assistance with HOB elevated. Pt will be sleeping in recliner at home.   Transfers Overall transfer level: Needs assistance   Transfers: Sit to/from Stand Sit to Stand: Supervision         General transfer comment: Supervision for safety. Able to complete with only R UE support and demonstrates good adherence to NWB precautions.     Balance Overall balance assessment: Mild deficits observed, not formally tested                                         ADL either performed or assessed with clinical judgement   ADL Overall ADL's : Needs assistance/impaired Eating/Feeding: Set up;Sitting   Grooming: Set up;Sitting   Upper Body Bathing: Sitting;Minimal assistance   Lower Body Bathing: Sit to/from stand;Minimal assistance   Upper Body Dressing : Minimal assistance;Sitting   Lower Body Dressing: Sit to/from stand;Moderate assistance Lower Body Dressing Details (indicate cue type and reason): Pt reports she will be wearing gowns that snap up  the front rather than pants and never wears socks.  Toilet Transfer: Copy Details (indicate  cue type and reason): Discussed use of 3-in-1 over commode as pt needs R UE support to stand.  Toileting- Clothing Manipulation and Hygiene: Supervision/safety;Sit to/from stand       Functional mobility during ADLs: Supervision/safety General ADL Comments: Pt and daughter educated concerning compensatory dressing, bathing, and grooming strategies. There was also concern for her ability to stand from recliner and commode. Educated family to place 3-in-1 over commode and pt was able to demonstrate ability to stand with R UE support alone. Also practiced from recliner and pt able to complete with supervision.      Vision Baseline Vision/History: No visual deficits Patient Visual Report: No change from baseline Vision Assessment?: No apparent visual deficits     Perception     Praxis      Pertinent Vitals/Pain Pain Assessment: Faces Faces Pain Scale: Hurts little more Pain Location: L shoulder Pain Descriptors / Indicators: Aching;Operative site guarding;Sore Pain Intervention(s): Limited activity within patient's tolerance;Monitored during session;Repositioned     Hand Dominance Right   Extremity/Trunk Assessment Upper Extremity Assessment Upper Extremity Assessment: LUE deficits/detail LUE Deficits / Details: Post-operative pain, decreased strength and ROM as well as decreased motor control as expected post-operatively.    Lower Extremity Assessment Lower Extremity Assessment: Overall WFL for tasks assessed       Communication Communication Communication: No difficulties   Cognition Arousal/Alertness: Awake/alert Behavior During Therapy: WFL for tasks assessed/performed Overall Cognitive Status: Within Functional Limits for tasks assessed                                     General Comments  Daughter present and engaged in session, taking notes throughout.     Exercises Exercises: Shoulder Shoulder Exercises Pendulum Exercise: Left;10  reps;Standing(min guard for safety) Shoulder Flexion: AAROM;Right;10 reps;Supine Shoulder ABduction: AAROM;10 reps;Seated;Left Shoulder External Rotation: AAROM;10 reps;Seated;Left Elbow Flexion: AROM;Left Elbow Extension: AROM;Left;10 reps;Seated Wrist Flexion: AROM;Left;10 reps;Seated Wrist Extension: AROM;Left;10 reps;Seated Digit Composite Flexion: AROM;Left;10 reps;Seated Composite Extension: AROM;Left;10 reps;Seated   Shoulder Instructions Shoulder Instructions Donning/doffing shirt without moving shoulder: Minimal assistance Method for sponge bathing under operated UE: Minimal assistance Donning/doffing sling/immobilizer: Minimal assistance Correct positioning of sling/immobilizer: Supervision/safety Pendulum exercises (written home exercise program): Min-guard ROM for elbow, wrist and digits of operated UE: Supervision/safety Sling wearing schedule (on at all times/off for ADL's): Supervision/safety Proper positioning of operated UE when showering: Supervision/safety Positioning of UE while sleeping: [place] expects to be discharged to:: Private residence Living Arrangements: Spouse/significant other;Children Available Help at Discharge: Family Type of Home: House             Bathroom Shower/Tub: Teaching laboratory technician Toilet: Standard     Home Equipment: Bedside commode          Prior Functioning/Environment Level of Independence: Needs assistance  Gait / Transfers Assistance Needed: Independent ADL's / Homemaking Assistance Needed: Assistance for donning LB clothing at times. Does not wear socks.             OT Problem List: Decreased strength;Decreased range of motion;Impaired balance (sitting and/or standing);Pain;Impaired UE functional use      OT Treatment/Interventions:      OT Goals(Current goals can be found in the care plan section) Acute Rehab OT Goals Patient Stated Goal: go home today OT Goal  Formulation: With patient  OT Frequency:     Barriers to D/C:            Co-evaluation              AM-PAC PT "6 Clicks" Daily Activity     Outcome Measure Help from another person eating meals?: A Little Help from another person taking care of personal grooming?: A Little Help from another person toileting, which includes using toliet, bedpan, or urinal?: A Little Help from another person bathing (including washing, rinsing, drying)?: A Little Help from another person to put on and taking off regular upper body clothing?: A Little Help from another person to put on and taking off regular lower body clothing?: A Little 6 Click Score: 18   End of Session Equipment Utilized During Treatment: Other (comment)(L shoulder sling) Nurse Communication: Mobility status  Activity Tolerance: Patient tolerated treatment well Patient left: in bed;with call bell/phone within reach;with family/visitor present(seated at EOB)  OT Visit Diagnosis: Other abnormalities of gait and mobility (R26.89);Pain Pain - Right/Left: Left Pain - part of body: Shoulder                Time: 5053-9767 OT Time Calculation (min): 32 min Charges:  OT General Charges $OT Visit: 1 Visit OT Evaluation $OT Eval Moderate Complexity: 1 Mod OT Treatments $Self Care/Home Management : 8-22 mins G-Codes:     Norman Herrlich, MS OTR/L  Pager: [place] A Eiley Mcginnity 12/18/2017, 11:44 AM

## 2017-12-20 ENCOUNTER — Encounter (HOSPITAL_COMMUNITY): Payer: Self-pay | Admitting: Orthopedic Surgery

## 2017-12-20 ENCOUNTER — Telehealth: Payer: Self-pay | Admitting: Cardiology

## 2017-12-20 LAB — TYPE AND SCREEN
ABO/RH(D): A NEG
Antibody Screen: POSITIVE
DONOR AG TYPE: NEGATIVE
DONOR AG TYPE: NEGATIVE
UNIT DIVISION: 0
Unit division: 0

## 2017-12-20 LAB — BPAM RBC
BLOOD PRODUCT EXPIRATION DATE: 201905172359
Blood Product Expiration Date: 201905152359
UNIT TYPE AND RH: 600
Unit Type and Rh: 600

## 2017-12-20 NOTE — Telephone Encounter (Signed)
Left a message to call back.

## 2017-12-20 NOTE — Telephone Encounter (Signed)
New message  Pt verbalzied that she is calling for RN  Pt want to know can she resume her plavix  After her surgery

## 2017-12-21 NOTE — Telephone Encounter (Signed)
Ivin Booty, your assessment is absolutely correct.  The surgeon should be the one to decide when it is okay to restart Plavix from a surgical standpoint.  If it is safe from a surgical standpoint (i.e. from a bleeding standpoint), then Yes, she should start it back.  By now it is probably about 4 days postop and she is probably fine.  Glenetta Hew, MD

## 2017-12-21 NOTE — Telephone Encounter (Signed)
Spoke to patient she states SHE WANTED TO KNOW WHEN TO RESTART CLOPIDOGREL   SURGERY WAS Friday 12/17/17 , [facility] . RN  INFORMED HER IT WAS UP TO DR Veverly Fells . PATIENT STATE DR NORRIS SAID " WHAT DID DR HARDING SAY " SO THAT IS WHY SHE IS CALLING?   PATIENT AWARE WILI DEFER TO DR Ellyn Hack AND CALL HER BACK?

## 2017-12-22 ENCOUNTER — Other Ambulatory Visit: Payer: Self-pay | Admitting: Cardiology

## 2017-12-22 NOTE — Telephone Encounter (Signed)
SPOKE TO PATIENT. INFORMATION GIVEN FROM HEART STANDPOINT MY RESTART ,BUT NEED TO CONTACT DR NORRIS OFFICE. PATIENT VERBALIZED UNDERSTANDING.

## 2017-12-29 ENCOUNTER — Ambulatory Visit: Payer: Medicare Other | Admitting: Cardiology

## 2018-01-15 ENCOUNTER — Other Ambulatory Visit: Payer: Self-pay | Admitting: Cardiology

## 2018-01-18 NOTE — Telephone Encounter (Signed)
Rx sent to pharmacy   

## 2018-01-22 ENCOUNTER — Ambulatory Visit: Payer: Medicare Other | Admitting: Family Medicine

## 2018-01-22 ENCOUNTER — Encounter: Payer: Self-pay | Admitting: Family Medicine

## 2018-01-22 VITALS — BP 122/80 | HR 72 | Temp 98.1°F | Ht 64.0 in | Wt 203.2 lb

## 2018-01-22 DIAGNOSIS — J209 Acute bronchitis, unspecified: Secondary | ICD-10-CM

## 2018-01-22 MED ORDER — AZITHROMYCIN 250 MG PO TABS: ORAL_TABLET | ORAL | 0 refills | Status: DC

## 2018-01-22 NOTE — Progress Notes (Signed)
   Subjective:    Patient ID: [name] [name], female    DOB: [birth year], 77 y.o.   MRN: [id number]  HPI Here for 3 days of chest congestion and coughing up yellow sputum. No SOB or fever.    Review of Systems  Constitutional: Negative.   HENT: Negative.   Eyes: Negative.   Respiratory: Positive for cough and chest tightness. Negative for shortness of breath and wheezing.        Objective:   Physical Exam  Constitutional: She appears well-developed and well-nourished. No distress.  HENT:  Right Ear: External ear normal.  Left Ear: External ear normal.  Nose: Nose normal.  Mouth/Throat: Oropharynx is clear and moist. No oropharyngeal exudate.  Eyes: Conjunctivae are normal.  Neck: No thyromegaly present.  Pulmonary/Chest: Effort normal. No stridor. No respiratory distress. She has no wheezes. She has no rales.  Scattered rhonchi   Lymphadenopathy:    She has no cervical adenopathy.          Assessment & Plan:  Bronchitis, treat with a Zpack. Add Delsym prn.  Alysia Penna, MD

## 2018-01-24 ENCOUNTER — Telehealth: Payer: Self-pay

## 2018-01-24 ENCOUNTER — Ambulatory Visit: Payer: Medicare Other | Admitting: Cardiology

## 2018-01-24 NOTE — Telephone Encounter (Signed)
Per chart review tab pt went to sat clinic on 01/22/18.

## 2018-01-24 NOTE — Telephone Encounter (Signed)
PLEASE NOTE: All timestamps contained within this report are represented as [place] Standard Time. CONFIDENTIALTY NOTICE: This fax transmission is intended only for the addressee. It contains information that is legally privileged, confidential or otherwise protected from use or disclosure. If you are not the intended recipient, you are strictly prohibited from reviewing, disclosing, copying using or disseminating any of this information or taking any action in reliance on or regarding this information. If you have received this fax in error, please notify us immediately by telephone so that we can arrange for its return to [name]. Phone: [phone], Toll-Free: [phone], Fax: [phone] Page: 1 of 2 Call Id: [id number] [place] Patient Name: [name] [name] Gender: Female DOB: Sep 12, 1940 Age: 77 Y 61 M 30 D Return Phone Number: [phone] (Primary), [phone] (Secondary) Address: City/State/Zip: [place] [address] Client  [facility] - Client Client Site [facility], Roque Lias - MD Contact Type Call Who Is Calling Patient / Member / Family / Caregiver Call Type Triage / Clinical Relationship To Patient Self Return Phone Number [phone] (Primary) Chief Complaint Cough Reason for Call Symptomatic / Request for [place] states she has a bad cough. Translation No Nurse Assessment Nurse: Windle Guard, RN, Lesa Date/Time (Eastern Time): 01/22/2018 9:27:43 AM Confirm and document reason for call. If symptomatic, describe symptoms. ---Caller states she has a non productive cough Does the patient have any new or worsening symptoms? ---Yes Will a triage be completed? ---Yes Related visit to physician within the last 2 weeks? ---No Does the PT have any chronic conditions? (i.e. diabetes, asthma,  etc.) ---Yes List chronic conditions. ---arthritis, heart stent, heart blockage Is this a behavioral health or substance abuse call? ---No Guidelines Guideline Title Affirmed Question Affirmed Notes Nurse Date/Time (Eastern Time) Cough - Acute Non- Productive SEVERE coughing spells (e.g., whooping sound after coughing, vomiting after coughing) Conner, RN, Lesa 01/22/2018 9:29:06 AM Disp. Time Eilene Ghazi Time) Disposition Final User 01/22/2018 9:34:46 AM See Physician within 24 Hours Yes Conner, RN, Emmaline Kluver Caller Disagree/Comply Comply Caller Understands Yes PreDisposition Home Care PLEASE NOTE: All timestamps contained within this report are represented as [place] Standard Time. CONFIDENTIALTY NOTICE: This fax transmission is intended only for the addressee. It contains information that is legally privileged, confidential or otherwise protected from use or disclosure. If you are not the intended recipient, you are strictly prohibited from reviewing, disclosing, copying using or disseminating any of this information or taking any action in reliance on or regarding this information. If you have received this fax in error, please notify us immediately by telephone so that we can arrange for its return to [name]. Phone: [phone], Toll-Free: [phone], Fax: [phone] Page: 2 of 2 Call Id: [id number] Care Advice Given Per Guideline SEE PHYSICIAN WITHIN 24 HOURS: * IF OFFICE WILL BE OPEN: You need to be seen within the next 24 hours. Call your doctor when the office opens, and make an appointment. COUGHING SPELLS: * Drink warm fluids. Inhale warm mist. (Reason: both relax the airway and loosen up the phlegm) * Suck on cough drops or hard candy to coat the irritated throat. COUGH MEDICINES: * OTC COUGH SYRUPS: The most common cough suppressant in OTC cough medications is dextromethorphan. Often the letters 'DM' appear in the name. * OTC COUGH DROPS: Cough drops can help a lot, especially for mild  coughs. They reduce coughing by soothing your irritated  throat and removing that tickle sensation in the back of the throat. Cough drops also have the advantage of portability - you can carry them with you. * HOME REMEDY - HARD CANDY: Hard candy works just as well as medicine-flavored OTC cough drops. People who have diabetes should use sugar-free candy. * HOME REMEDY - HONEY: This old home remedy has been shown to help decrease coughing at night. The adult dosage is 2 teaspoons (10 ml) at bedtime. Honey should not be given to infants under one year of age. OTC COUGH SYRUP - DEXTROMETHORPHAN: * Cough syrups containing the cough suppressant dextromethorphan (DM) may help decrease your cough. Cough syrups work best for coughs that keep you awake at night. They can also sometimes help in the late stages of a respiratory infection when the cough is dry and hacking. They can be used along with cough drops. HUMIDIFIER: If the air is dry, use a humidifier in the bedroom. (Reason: dry air makes coughs worse) AVOID TOBACCO SMOKE: Smoking or being exposed to smoke makes coughs much worse. * Do not try to completely suppress coughs that produce mucus and phlegm. Remember that coughing is helpful in bringing up mucus from the lungs and preventing pneumonia. CAUTION - DEXTROMETHORPHAN: CALL BACK IF: * Difficulty breathing occurs * You become worse. CARE ADVICE given per Cough - Acute Non-Productive (Adult) guideline. Referrals [facility]

## 2018-01-27 ENCOUNTER — Other Ambulatory Visit: Payer: Self-pay | Admitting: Cardiology

## 2018-01-27 NOTE — Telephone Encounter (Signed)
Rx sent to pharmacy   

## 2018-02-09 ENCOUNTER — Encounter: Payer: Self-pay | Admitting: Family Medicine

## 2018-02-11 ENCOUNTER — Telehealth: Payer: Self-pay

## 2018-02-11 ENCOUNTER — Encounter: Payer: Self-pay | Admitting: *Deleted

## 2018-02-11 MED ORDER — BENZONATATE 200 MG PO CAPS: 200.0000 mg | ORAL_CAPSULE | Freq: Three times a day (TID) | ORAL | 0 refills | Status: DC | PRN

## 2018-02-11 NOTE — Telephone Encounter (Signed)
Copied from [place] [id number]. Topic: Quick Communication - See Telephone Encounter >> Feb 11, 2018  2:48 PM Antonieta Iba C wrote: CRM for notification. See Telephone encounter for: 02/11/18.   Pt called in to be advised pt says that she was seen by Dr. Sarajane Jews and was Dx with bronchitis. Pt says that she completed all of the medication 2 weeks ago and she is till having cough. Pt would like to be advised on what she should?

## 2018-02-11 NOTE — Telephone Encounter (Signed)
Try tessalon for cough.  Sent. The cough should gradually get better but it can take a while.  If worse, then needs recheck.  Thanks.

## 2018-02-11 NOTE — Telephone Encounter (Signed)
Patient advised.

## 2018-02-13 ENCOUNTER — Other Ambulatory Visit: Payer: Self-pay | Admitting: Cardiology

## 2018-02-14 ENCOUNTER — Other Ambulatory Visit: Payer: Self-pay | Admitting: Cardiology

## 2018-02-14 NOTE — Telephone Encounter (Signed)
Rx request sent to pharmacy.  

## 2018-03-02 ENCOUNTER — Other Ambulatory Visit: Payer: Self-pay | Admitting: Cardiology

## 2018-03-09 ENCOUNTER — Ambulatory Visit: Payer: Medicare Other | Admitting: Cardiology

## 2018-03-09 ENCOUNTER — Encounter: Payer: Self-pay | Admitting: Cardiology

## 2018-03-09 VITALS — BP 118/62 | HR 78 | Ht 64.5 in | Wt 197.2 lb

## 2018-03-09 DIAGNOSIS — Z9861 Coronary angioplasty status: Secondary | ICD-10-CM

## 2018-03-09 DIAGNOSIS — E669 Obesity, unspecified: Secondary | ICD-10-CM

## 2018-03-09 DIAGNOSIS — I2510 Atherosclerotic heart disease of native coronary artery without angina pectoris: Secondary | ICD-10-CM | POA: Diagnosis not present

## 2018-03-09 DIAGNOSIS — E785 Hyperlipidemia, unspecified: Secondary | ICD-10-CM

## 2018-03-09 DIAGNOSIS — I351 Nonrheumatic aortic (valve) insufficiency: Secondary | ICD-10-CM | POA: Diagnosis not present

## 2018-03-09 DIAGNOSIS — E786 Lipoprotein deficiency: Secondary | ICD-10-CM

## 2018-03-09 DIAGNOSIS — D539 Nutritional anemia, unspecified: Secondary | ICD-10-CM

## 2018-03-09 DIAGNOSIS — I10 Essential (primary) hypertension: Secondary | ICD-10-CM

## 2018-03-09 DIAGNOSIS — R0609 Other forms of dyspnea: Secondary | ICD-10-CM | POA: Diagnosis not present

## 2018-03-09 MED ORDER — ISOSORBIDE MONONITRATE ER 60 MG PO TB24: 60.0000 mg | ORAL_TABLET | Freq: Every day | ORAL | 3 refills | Status: DC

## 2018-03-09 NOTE — Progress Notes (Signed)
PCP: Abner Greenspan, MD  Clinic Note: Chief Complaint  Patient presents with  . Follow-up    5 month  . Coronary Artery Disease  . Shortness of Breath    HPI: [name] [name] is a 77 y.o. female with a PMH below who presents today for close annual follow-up for CAD-PCI. --She was initially evaluated in [name] 2018 for dyspnea:   2 D Echo revealed areduced EF of 45 to 50% with septal and inferoseptal hypokinesis and moderate AI.    Stress test was intermediate risk with a EF of 30 to 44% w/ small inferior apical and septal infarct without ischemia.    Cardiac Cath ([name] 2018) for persistent Sx --> significant lesion in the L Circumflex -- treated with a DES stent.  Also noted moderate LAD disease that was FFR negative along with a small Diag w/ ostial 70% stenosis (not good PCI target).   [name] [name] was last seen on October 01, 2017-this was in follow-up for her echocardiogram reviewed below - ordered to reassess WMA, EF & AI.  Only noted exertional dyspnea.  Pre-op assessment for Shoulder Replacement Sgx  Recent Hospitalizations:   December 17, 2017: Admitted for left shoulder replacement.  Followed by PCP visit in June for bronchitis - Rx with Z-pack. -- finally recovering.  Studies Personally Reviewed - (if available, images/films reviewed: From Epic Chart or Care Everywhere)  September 07, 2017 -2D Echo: EF 55-60%.  Mild diastolic dysfunction with mild LVH.  Aortic sclerosis with mild insufficiency.  No regional wall motion abnormality.  Interval History: [name] [name] returns today still noting exertional dyspnea --> when she came in for her she says that stress test and echocardiogram and had her PCI, at that time other people were noticing her shortness of breath.  She says that she must be better because other people are not commenting on her dyspnea.  She does feel short of breath when she does things.  She acknowledges that she probably is that active anymore, and does not really  exercise but she does do work around the house and will continue to stop to catch her breath.  She says that since he had a modest improvement post PCI and nothing is really changed since. She still has no chest tightness pressure with rest or exertion --> she has been having a sleep in a recliner since she started being evaluated for shoulder pain   And is still doing so post surgery.  She has no heart failure symptoms of PND, orthopnea or edema to go along with exertional dyspnea.  No palpitations, lightheadedness, dizziness, weakness or syncope/near syncope. No TIA/amaurosis fugax symptoms. No melena, hematochezia, hematuria, or epstaxis. No claudication.  ROS: A comprehensive was performed. Review of Systems  HENT: Negative for nosebleeds.   Respiratory: Positive for cough and shortness of breath (Chronic). Negative for sputum production and wheezing.        Recovering from a bout of bronchitis in June.  Did not really get much benefit from Z-Pak.  Finally starting to clear up.  Cardiovascular: Negative for claudication.  Gastrointestinal: Negative for abdominal pain and heartburn.  Genitourinary: Negative for dysuria.  Musculoskeletal: Positive for joint pain (Right shoulder still hurts). Negative for myalgias.  Neurological: Negative for dizziness, focal weakness and weakness.  Endo/Heme/Allergies: Does not bruise/bleed easily.  Psychiatric/Behavioral: The patient is not nervous/anxious and does not have insomnia.        Is depressed.  She does not really describe symptoms of anhedonia,  but Amelia Jo is just not all that active.   All other systems reviewed and are negative.  I have reviewed and (if needed) personally updated the patient's problem list, medications, allergies, past medical and surgical history, social and family history.   Past Medical History:  Diagnosis Date  . Anemia    of chronic disease  . Anxiety   . Arthritis    "psoratic arthritis; new dx" (09/11/2016)  . CAD  S/P percutaneous coronary angioplasty 08/2017   mLCx 90% -> 0% (2.75 mm x 16 mm) Synergy DES PCI Ostial D1 70% (not PTCA target). Moderate ostial and mD2.  LAD tandem 50% stenoses w/ negative FFR (0.86)  . Cancer ([place])    skin cancer on leg  . Chronic lower back pain   . Complication of anesthesia 1980 and 1982   trouble waking up after breast biopsy, many surgeries since no problem  . Depression   . Dyspnea on effort - CHRONIC    partial relief of Sx post PCI of L Circumflex.  . Fibrocystic breast   . GERD (gastroesophageal reflux disease)   . Headache    "monthly" (09/11/2016)  . History of blood transfusion    "several since age 68; I've had them after most of my surgeries" (09/11/2016)  . History of myocardial infarction    "doctor said there are signs I've had a heart attack; don't know when"  - Myoview [name] 2018 indicated small Inferior - Inferoseptal Infarct (vs. rest ischemia). Echo with inferoseptal hypokinesis.  Marland Kitchen Hx of skin cancer, basal cell 2013 and 2012   right inner, lower leg; several scattered around my body  . Hypertension   . Iron deficiency anemia    "I've had an iron infusion"  . Osteoarthritis   . Osteopenia   . Peptic ulcer disease    H pylori  . PMR (polymyalgia rheumatica) ([facility])   . PONV (postoperative nausea and vomiting)   . Psoriasis     Past Surgical History:  Procedure Laterality Date  . APPENDECTOMY    . BACK SURGERY     x 6   . BREAST BIOPSY Left 1980 and 1982   "benign"  . CARDIAC CATHETERIZATION N/A 09/11/2016   Procedure: Left Heart Cath and Coronary Angiography;  Surgeon: Leonie Man, MD;  Location: [facility];  Service: Cardiovascular: mLCx 90% * (PCI). Ostial D1 70% (not PTCA target). Mod ostial and mD2.  LAD tandem 50% stenoses w/ negative FFR (0.86)  . CARDIAC CATHETERIZATION N/A 09/11/2016   Procedure: Intravascular Pressure Wire/FFR Study;  Surgeon: Leonie Man, MD;  Location: [facility];  Service: Cardiovascular:  --  LAD tandem 50% stenoses w/ negative FFR (0.86  . CARDIAC CATHETERIZATION N/A 09/11/2016   Procedure: Coronary Stent Intervention;  Surgeon: Leonie Man, MD;  Location: [facility];  Service: Cardiovascular: -- DES PCI mLCx 90%--0% SYNERGY DES 2.75 MM X 16 MM.  Marland Kitchen CARDIAC CATHETERIZATION  2001   non-obstructive CAD  . CATARACT EXTRACTION W/ INTRAOCULAR LENS  IMPLANT, BILATERAL  July -  August 2017  . DILATION AND CURETTAGE OF UTERUS  X 3  . ESOPHAGOGASTRODUODENOSCOPY  03/2009   ulcer,HP, GERD stricture  . EXCISIONAL HEMORRHOIDECTOMY    . HIP ARTHROPLASTY Left 2010  . JOINT REPLACEMENT    . KNEE ARTHROSCOPY Bilateral   . LAPAROSCOPIC CHOLECYSTECTOMY  2005  . LUMBAR DISC SURGERY  11/1984; 1987; 1989; 2004; 2005 X 2   deg. disk lumber spine   .  MOHS SURGERY Right    "inside my lower leg"  . NASAL SINUS SURGERY  1970s  . NM MYOVIEW LTD  08/28/2016   INTERMEDIATE RISK (b/c reduced EF & ? small infarct) -- EF ~35-40%.  ? Prior Small Inferior-apical & septal infarct (w/o ischemia).  Difuse HK - worse in inferoapex.  Suggest prior infarct with no ischemia. Ischemic area correlates with echo WMA  . REVERSE SHOULDER ARTHROPLASTY Left 12/17/2017   Procedure: LEFT REVERSE SHOULDER ARTHROPLASTY;  Surgeon: Netta Cedars, MD;  Location: [facility];  Service: Orthopedics;  Laterality: Left;  . TOTAL HIP ARTHROPLASTY Right 04/04/2013   Procedure: RIGHT TOTAL HIP ARTHROPLASTY ANTERIOR APPROACH;  Surgeon: Mauri Pole, MD;  Location: [facility];  Service: Orthopedics;  Laterality: Right;  . TOTAL KNEE ARTHROPLASTY Right 2009  . TRANSTHORACIC ECHOCARDIOGRAM  08/2016   mild conc LVH. EF 45-50% - anteroseptal & inferoseptal HK. Mod AI.  Marland Kitchen TRANSTHORACIC ECHOCARDIOGRAM  08/2017   (post PCI) --> EF normalized:  Normal LV systolic function; mild diastolic dysfunction; mild   LVH; sclerotic aortic valve with mild AI.  No RWMA  . TUBAL LIGATION    . VAGINAL HYSTERECTOMY  1970s   partial    09/11/2016:  Post-Intervention Diagram: Mid Cx lesion, 90 %stenosed STENT SYNERGY DES 2.75X16 - LAD FFR Negative        Current Meds  Medication Sig  . acetaminophen (TYLENOL) 500 MG tablet Take 1,000 mg by mouth 2 (two) times daily as needed for moderate pain or headache.   Marland Kitchen atorvastatin (LIPITOR) 20 MG tablet TAKE 1 TABLET BY MOUTH EVERY DAY  . azithromycin (ZITHROMAX Z-PAK) 250 MG tablet As directed  . benzonatate (TESSALON) 200 MG capsule Take 1 capsule (200 mg total) by mouth 3 (three) times daily as needed.  . clopidogrel (PLAVIX) 75 MG tablet TAKE 1 TABLET (75 MG TOTAL) BY MOUTH DAILY WITH BREAKFAST.  . clotrimazole-betamethasone (LOTRISONE) cream APPLY TO AFFECTED AREA TWICE A DAY UNTIL CLEAR (Patient taking differently: Apply 1 application topically 2 (two) times daily as needed (rash). )  . ergocalciferol (VITAMIN D2) 50000 units capsule Take 1 capsule (50,000 Units total) by mouth once a week. For 12 weeks  . folic acid (FOLVITE) 1 MG tablet Take 1 mg by mouth daily.  . hydrochlorothiazide (HYDRODIURIL) 12.5 MG tablet TAKE 1 TABLET BY MOUTH EVERY DAY WITH LOSARTAN TABLET  . HYDROcodone-acetaminophen (NORCO/VICODIN) 5-325 MG tablet Take 1-2 tablets by mouth every 4 (four) hours as needed for moderate pain or severe pain.  . hyoscyamine (LEVSIN, ANASPAZ) 0.125 MG tablet TAKE 1 TABLET BY MOUTH AS NEEDED STOMACH SPASMS  . isosorbide mononitrate (IMDUR) 60 MG 24 hr tablet Take 1 tablet (60 mg total) by mouth daily.  Marland Kitchen losartan (COZAAR) 50 MG tablet TAKE 1 TABLET BY MOUTH EVERY DAY WITH HCTZ  . Melatonin 3 MG CAPS Take 6 mg by mouth at bedtime.  . Menthol, Topical Analgesic, (BLUE-EMU MAXIMUM STRENGTH EX) Apply 1 application topically daily as needed (arthritis).  . methotrexate (50 MG/ML) 1 g injection Inject 40 mg into the vein once a week.   Marland Kitchen oxymetazoline (AFRIN) 0.05 % nasal spray Place 2 sprays into the nose 2 (two) times daily as needed for congestion.  . pantoprazole (PROTONIX) 40 MG tablet  TAKE 1 TABLET BY MOUTH EVERY DAY  . Phenylephrine-Aspirin (ALKA-SELTZER PLUS SINUS PO) Take 2 tablets by mouth daily as needed (sinuses).  . predniSONE (DELTASONE) 5 MG tablet Take 5 mg by mouth daily with breakfast.   .  raloxifene (EVISTA) 60 MG tablet Take 1 tablet (60 mg total) by mouth daily.  . [DISCONTINUED] isosorbide mononitrate (IMDUR) 30 MG 24 hr tablet TAKE 1 TABLET BY MOUTH EVERY DAY    Allergies  Allergen Reactions  . 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 Ace Inhibitors     cough  . Oxaprozin     GI upset   . Pregabalin Swelling    SWELLING REACTION UNSPECIFIED   . Amoxicillin-Pot Clavulanate Nausea And Vomiting    gi upset Has patient had a PCN reaction causing immediate rash, facial/tongue/throat swelling, SOB or lightheadedness with hypotension: No Has patient had a PCN reaction causing severe rash involving mucus membranes or skin necrosis: No Has patient had a PCN reaction that required hospitalization No Has patient had a PCN reaction occurring within the last 10 years: No If all of the above answers are "NO", then may proceed with Cephalosporin use.     Social History   Tobacco Use  . Smoking status: Never Smoker  . Smokeless tobacco: Never Used  Substance Use Topics  . Alcohol use: Yes    Alcohol/week: 0.0 oz    Comment: 09/11/2016 "might have a margarita q 3-4 months"  . Drug use: No   Social History   Social History Narrative  . Not on file    family history includes Arthritis in her mother; Emphysema in her mother; Heart disease in her brother; Hyperlipidemia in her mother; Hypertension in her mother.  Wt Readings from Last 3 Encounters:  03/09/18 197 lb 3.2 oz (89.4 kg)  01/22/18 203 lb 4 oz (92.2 kg)  12/17/17 202 lb (91.6 kg)    PHYSICAL EXAM BP 118/62   Pulse 78   Ht 5' 4.5" (1.638 m)   Wt 197 lb 3.2 oz (89.4 kg)   SpO2 94%   BMI 33.33 kg/m  Physical Exam  Constitutional: She is oriented  to person, place, and time. She appears well-developed and well-nourished. No distress.  Healthy-appearing.  Well-groomed.  HENT:  Head: Normocephalic and atraumatic.  Neck: Normal range of motion. Neck supple. No hepatojugular reflux and no JVD present. Carotid bruit is not present. No thyromegaly present.  Cardiovascular: Normal rate, regular rhythm, S1 normal, S2 normal, intact distal pulses and normal pulses.  No extrasystoles are present. PMI is not displaced. Exam reveals distant heart sounds. Exam reveals no gallop and no friction rub.  Murmur heard.  Medium-pitched harsh crescendo-decrescendo early systolic murmur is present with a grade of 1/6 at the upper right sternal border. Pulmonary/Chest: Breath sounds normal. No respiratory distress. She has no wheezes. She has no rales. She exhibits no tenderness.  Mild interstitial sounds, but no rales rhonchi or crackles.  Abdominal: Soft. Bowel sounds are normal. She exhibits no distension. There is no tenderness. There is no rebound.  No HSM  Musculoskeletal: Normal range of motion. She exhibits no edema.  Lymphadenopathy:    She has no cervical adenopathy.  Neurological: She is alert and oriented to person, place, and time.  Psychiatric: She has a normal mood and affect. Her behavior is normal. Judgment and thought content normal.  Vitals reviewed.    Adult ECG Report n/a  Other studies Reviewed: Additional studies/ records that were reviewed today include:  Recent Labs:   Lab Results  Component Value Date   CHOL 151 10/12/2017   HDL 44.00 10/12/2017   LDLCALC 52 10/27/2016   LDLDIRECT 74.0 10/12/2017   TRIG 205.0 (H)  10/12/2017   CHOLHDL 3 10/12/2017   Lab Results  Component Value Date   CREATININE 1.03 (H) 12/18/2017   BUN 18 12/18/2017   NA 131 (L) 12/18/2017   K 4.1 12/18/2017   CL 99 (L) 12/18/2017   CO2 23 12/18/2017   CBC Latest Ref Rng & Units 12/18/2017 12/09/2017 10/12/2017  WBC 4.0 - 10.5 K/uL - 10.0 5.5    Hemoglobin 12.0 - 15.0 g/dL 8.7(L) 10.0(L) 10.2(L)  Hematocrit 36.0 - 46.0 % 28.0(L) 32.7(L) 31.6(L)  Platelets 150 - 400 K/uL - 279 243.0    ASSESSMENT / PLAN: Problem List Items Addressed This Visit    Obesity (BMI 30.0-34.9) (Chronic)    She really needs to work on increasing her level of exercise.  Her diet seems to be okay based on lipid panel.  Needs to work on losing weight.      Hyperlipidemia with low HDL (Chronic)    She is really at goal for lipids.  Her LDL is at 50.  Not much more we can do than continue current dose of atorvastatin. Continue healthy diet.      Relevant Medications   isosorbide mononitrate (IMDUR) 60 MG 24 hr tablet   Essential hypertension (Chronic)    Blood pressure looks great on current meds.  No change.  Should be able to tolerate increased dose of Imdur.      Relevant Medications   isosorbide mononitrate (IMDUR) 60 MG 24 hr tablet   Dyspnea on exertion (Chronic)    Chronic symptoms.  It is hard to believe this on cardiac etiology because her EF, wall motion and AI all improved following PCI.  Still a low threshold for rechecking stress test.  I think a lot of her shortness of breath must be related to deconditioning and obesity as well as sedentary lifestyle. Increase her Imdur to 60 mg to see if this may help some.  Also need to evaluate her anemia seen on lab review.  Will defer to PCP.      Deficiency anemia    Anemia based on her labs.  This is from April --will defer evaluation to PCP.  This is probably all postop but need to make sure.       CAD S/P percutaneous coronary angioplasty - Primary (Chronic)    Finding results from a symptom standpoint following PCI.  Interestingly, her EF totally got better as did the wall motion normality.  This would suggest that the circumflex lesion was indeed significant and was probably causing rest ischemia.  I would have expected that her symptoms would have notably improved.  Unfortunately, she  still complains of shortness of breath.  This would indicate that there is probably some other etiology. She is on stable beta-blocker and ARB along with statin. We stopped aspirin which is reasonable  given that she has been anemic on last check.  Continue Plavix, but if necessary for concern of GI bleeding or procedures, would be okay to stop (for procedure was stopped 5 days preop, 7 days for spine procedures)      Relevant Medications   isosorbide mononitrate (IMDUR) 60 MG 24 hr tablet   Aortic regurgitation (Chronic)    Mild aortic sclerosis murmur heard on exam.  I do not hear a diastolic murmur aortic insufficiency.  There is only mild insufficiency noted on this follow-up echocardiogram.      Relevant Medications   isosorbide mononitrate (IMDUR) 60 MG 24 hr tablet       I  spent a total of 25 minutes with the patient and chart review. >  50% of the time was spent in direct patient consultation.   Current medicines are reviewed at length with the patient today.  (+/- concerns) n/a The following changes have been made:  increase Imdur dose.  Patient Instructions  Dr Ellyn Hack has recommended making the following medication changes: 1. INCREASE Isosorbide to 60 mg daily  Your physician recommends that you schedule a follow-up appointment in DECEMBER 2019.  If you need a refill on your cardiac medications before your next appointment, please call your pharmacy.    Studies Ordered:   No orders of the defined types were placed in this encounter.     Glenetta Hew, M.D., M.S. Interventional Cardiologist   Pager # [phone] Phone # [phone] [address] [place], [place] [address]   Thank you for choosing Heartcare at [facility]!!

## 2018-03-09 NOTE — Patient Instructions (Signed)
Dr Ellyn Hack has recommended making the following medication changes: 1. INCREASE Isosorbide to 60 mg daily  Your physician recommends that you schedule a follow-up appointment in DECEMBER 2019.  If you need a refill on your cardiac medications before your next appointment, please call your pharmacy.

## 2018-03-11 ENCOUNTER — Encounter: Payer: Self-pay | Admitting: Cardiology

## 2018-03-11 NOTE — Assessment & Plan Note (Signed)
Anemia based on her labs.  This is from April --will defer evaluation to PCP.  This is probably all postop but need to make sure.

## 2018-03-11 NOTE — Assessment & Plan Note (Signed)
Chronic symptoms.  It is hard to believe this on cardiac etiology because her EF, wall motion and AI all improved following PCI.  Still a low threshold for rechecking stress test.  I think a lot of her shortness of breath must be related to deconditioning and obesity as well as sedentary lifestyle. Increase her Imdur to 60 mg to see if this may help some.  Also need to evaluate her anemia seen on lab review.  Will defer to PCP.

## 2018-03-11 NOTE — Assessment & Plan Note (Signed)
She is really at goal for lipids.  Her LDL is at 50.  Not much more we can do than continue current dose of atorvastatin. Continue healthy diet.

## 2018-03-11 NOTE — Assessment & Plan Note (Signed)
Finding results from a symptom standpoint following PCI.  Interestingly, her EF totally got better as did the wall motion normality.  This would suggest that the circumflex lesion was indeed significant and was probably causing rest ischemia.  I would have expected that her symptoms would have notably improved.  Unfortunately, she still complains of shortness of breath.  This would indicate that there is probably some other etiology. She is on stable beta-blocker and ARB along with statin. We stopped aspirin which is reasonable  given that she has been anemic on last check.  Continue Plavix, but if necessary for concern of GI bleeding or procedures, would be okay to stop (for procedure was stopped 5 days preop, 7 days for spine procedures)

## 2018-03-11 NOTE — Assessment & Plan Note (Signed)
Mild aortic sclerosis murmur heard on exam.  I do not hear a diastolic murmur aortic insufficiency.  There is only mild insufficiency noted on this follow-up echocardiogram.

## 2018-03-11 NOTE — Assessment & Plan Note (Signed)
She really needs to work on increasing her level of exercise.  Her diet seems to be okay based on lipid panel.  Needs to work on losing weight.

## 2018-03-11 NOTE — Assessment & Plan Note (Addendum)
Blood pressure looks great on current meds.  No change.  Should be able to tolerate increased dose of Imdur.

## 2018-03-29 ENCOUNTER — Other Ambulatory Visit: Payer: Self-pay | Admitting: Cardiology

## 2018-03-29 NOTE — Telephone Encounter (Signed)
Rx request sent to pharmacy.  

## 2018-05-03 IMAGING — NM NM MISC PROCEDURE
3 series · 18 of 18 positions shown · non-contrast
Comparison: none

[Series 1: wbr_s-proj_st stress_(id)_sa · 6.5mm · 6.51mm/px · 6 of 64 frames shown (1 of 2)]
[frame 6/64]
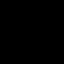
[frame 16/64]
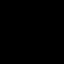
[frame 27/64]
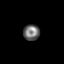
[frame 38/64]
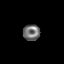
[frame 48/64]
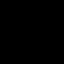
[frame 59/64]
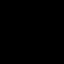

[Series 1: wbr_s-proj_st stress_(id)_sa · 6.5mm · 6.51mm/px · 6 of 512 frames shown (2 of 2)]
[frame 43/512]
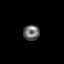
[frame 128/512]
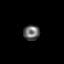
[frame 214/512]
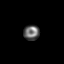
[frame 299/512]
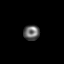
[frame 384/512]
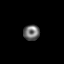
[frame 470/512]
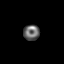

[Series 1: wbr_r-proj_st rest_(id)_sa · 6.5mm · 6.51mm/px · 6 of 64 frames shown]
[frame 6/64]
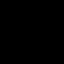
[frame 16/64]
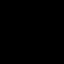
[frame 27/64]
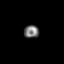
[frame 38/64]
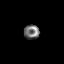
[frame 48/64]
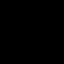
[frame 59/64]
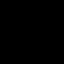

[18 of 18 positions shown; findings below may reference images not displayed]

Canned report from images found in remote index.

Refer to host system for actual result text.

## 2018-05-25 ENCOUNTER — Other Ambulatory Visit: Payer: Self-pay | Admitting: Cardiology

## 2018-08-06 ENCOUNTER — Other Ambulatory Visit: Payer: Self-pay | Admitting: Cardiology

## 2018-08-09 ENCOUNTER — Ambulatory Visit: Payer: Medicare Other | Admitting: Cardiology

## 2018-08-10 ENCOUNTER — Ambulatory Visit: Payer: Medicare Other | Admitting: Cardiology

## 2018-08-10 ENCOUNTER — Encounter: Payer: Self-pay | Admitting: Cardiology

## 2018-08-10 VITALS — BP 114/64 | HR 77 | Ht 64.5 in | Wt 178.9 lb

## 2018-08-10 DIAGNOSIS — E785 Hyperlipidemia, unspecified: Secondary | ICD-10-CM | POA: Diagnosis not present

## 2018-08-10 DIAGNOSIS — I447 Left bundle-branch block, unspecified: Secondary | ICD-10-CM

## 2018-08-10 DIAGNOSIS — R0609 Other forms of dyspnea: Secondary | ICD-10-CM

## 2018-08-10 DIAGNOSIS — I2510 Atherosclerotic heart disease of native coronary artery without angina pectoris: Secondary | ICD-10-CM

## 2018-08-10 DIAGNOSIS — E669 Obesity, unspecified: Secondary | ICD-10-CM

## 2018-08-10 DIAGNOSIS — Z9861 Coronary angioplasty status: Secondary | ICD-10-CM

## 2018-08-10 DIAGNOSIS — I10 Essential (primary) hypertension: Secondary | ICD-10-CM

## 2018-08-10 DIAGNOSIS — I351 Nonrheumatic aortic (valve) insufficiency: Secondary | ICD-10-CM

## 2018-08-10 DIAGNOSIS — E786 Lipoprotein deficiency: Secondary | ICD-10-CM

## 2018-08-10 NOTE — Patient Instructions (Signed)
Medication Instructions:  Not needed If you need a refill on your cardiac medications before your next appointment, please call your pharmacy.   Lab work: Not needed If you have labs (blood work) drawn today and your tests are completely normal, you will receive your results only by: Marland Kitchen MyChart Message (if you have MyChart) OR . A paper copy in the mail If you have any lab test that is abnormal or we need to change your treatment, we will call you to review the results.  Testing/Procedures: Schedule both at [place] physician has recommended that you have a cardiopulmonary stress test (CPX). CPX testing is a non-invasive measurement of heart and lung function. It replaces a traditional treadmill stress test. This type of test provides a tremendous amount of information that relates not only to your present condition but also for future outcomes. This test combines measurements of you ventilation, respiratory gas exchange in the lungs, electrocardiogram (EKG), blood pressure and physical response before, during, and following an exercise protocol. And  Your physician has recommended that you have a pulmonary function test. Pulmonary Function Tests are a group of tests that measure how well air moves in and out of your lungs.     Follow-Up: At [facility], you and your health needs are our priority.  As part of our continuing mission to provide you with exceptional heart care, we have created designated Provider Care Teams.  These Care Teams include your primary Cardiologist (physician) and Advanced Practice Providers (APPs -  Physician Assistants and Nurse Practitioners) who all work together to provide you with the care you need, when you need it. You will need a follow up appointment in 6 months June 2020.  Please call our office 2 months in advance to schedule this appointment.  You may see Glenetta Hew, MD or one of the following Advanced Practice Providers on your designated  Care Team:   Rosaria Ferries, PA-C . Jory Sims, DNP, ANP  Any Other Special Instructions Will Be Listed Below (If Applicable).

## 2018-08-10 NOTE — Progress Notes (Signed)
PCP: Abner Greenspan, MD  Clinic Note: No chief complaint on file.   HPI: [name] [name] is a 77 y.o. female with a history of CAD-PCI below who presents today for 109-month follow-up  --She was initially evaluated in [name] 2018 for dyspnea:   2 D Echo revealed areduced EF of 45 to 50% with septal and inferoseptal hypokinesis and moderate AI.    Stress test was intermediate risk with a EF of 30 to 44% w/ small inferior apical and septal infarct without ischemia.    Cardiac Cath ([name] 2018) for persistent Sx --> significant lesion in the L Circumflex -- treated with a DES stent.  Also noted moderate LAD disease that was FFR negative along with a small Diag w/ ostial 70% stenosis (not good PCI target).   [name] was last seen on March 09, 2018.  Still noted exertional dyspnea -- really had not noted an appreciable difference in her exertional dyspnea post PCI.  She tolerated her shoulder surgery well though.  Recent Hospitalizations:   None  Studies Personally Reviewed - (if available, images/films reviewed: From Epic Chart or Care Everywhere)  September 07, 2017 -2D Echo: EF 55-60%.  Mild diastolic dysfunction with mild LVH.  Aortic sclerosis with mild insufficiency.  No regional wall motion abnormality.  Interval History: [name] [name] returns today still noticing that she has her exertional dyspnea.  She gets short of breath walking around going grocery shopping or any activities.  Not just walking around the house, but with any type of exertion.  She has not had any chest tightness or pressure with rest or exertion.  Only noted minimal improvement post PCI. She denies any PND, orthopnea or edema to suggest heart failure.  No rapid irregular heartbeats palpitations.  No syncope/near syncope or TIA/amaurosis fugax.  No claudication.  ROS: A comprehensive was performed. Review of Systems  Constitutional: Negative for malaise/fatigue and weight loss.  HENT: Negative for congestion and  nosebleeds.   Respiratory: Positive for shortness of breath (Chronic). Negative for cough, sputum production and wheezing.   Cardiovascular: Negative for claudication.  Gastrointestinal: Negative for blood in stool, heartburn and melena.  Genitourinary: Negative for dysuria and hematuria.  Musculoskeletal: Positive for joint pain (Right shoulder still hurts). Negative for myalgias.  Neurological: Negative for dizziness, focal weakness and weakness.  Endo/Heme/Allergies: Does not bruise/bleed easily.  Psychiatric/Behavioral: Positive for depression. Negative for memory loss. The patient is not nervous/anxious and does not have insomnia.        Describes symptoms that seem more consistent with anhedonia and at least dysthymia.  All other systems reviewed and are negative.  I have reviewed and (if needed) personally updated the patient's problem list, medications, allergies, past medical and surgical history, social and family history.   Past Medical History:  Diagnosis Date  . Anemia    of chronic disease  . Anxiety   . Arthritis    "psoratic arthritis; new dx" (09/11/2016)  . CAD S/P percutaneous coronary angioplasty 08/2016   mLCx 90% -> 0% (2.75 mm x 16 mm) Synergy DES PCI Ostial D1 70% (not PTCA target). Moderate ostial and mD2.  LAD tandem 50% stenoses w/ negative FFR (0.86)  . Cancer ([place])    skin cancer on leg  . Chronic lower back pain   . Complication of anesthesia 1980 and 1982   trouble waking up after breast biopsy, many surgeries since no problem  . Depression   . Dyspnea on effort - CHRONIC  partial relief of Sx post PCI of L Circumflex.  . Fibrocystic breast   . GERD (gastroesophageal reflux disease)   . Headache    "monthly" (09/11/2016)  . History of blood transfusion    "several since age 58; I've had them after most of my surgeries" (09/11/2016)  . History of myocardial infarction    "doctor said there are signs I've had a heart attack; don't know when"  -  Myoview [name] 2018 indicated small Inferior - Inferoseptal Infarct (vs. rest ischemia). Echo with inferoseptal hypokinesis.  Marland Kitchen Hx of skin cancer, basal cell 2013 and 2012   right inner, lower leg; several scattered around my body  . Hypertension   . Iron deficiency anemia    "I've had an iron infusion"  . Osteoarthritis   . Osteopenia   . Peptic ulcer disease    H pylori  . PMR (polymyalgia rheumatica) (HCC)   . PONV (postoperative nausea and vomiting)   . Psoriasis     Past Surgical History:  Procedure Laterality Date  . APPENDECTOMY    . BACK SURGERY     x 6   . BREAST BIOPSY Left 1980 and 1982   "benign"  . CARDIAC CATHETERIZATION N/A 09/11/2016   Procedure: Left Heart Cath and Coronary Angiography;  Surgeon: Leonie Man, MD;  Location: [facility];  Service: Cardiovascular: mLCx 90% * (PCI). Ostial D1 70% (not PTCA target). Mod ostial and mD2.  LAD tandem 50% stenoses w/ negative FFR (0.86)  . CARDIAC CATHETERIZATION N/A 09/11/2016   Procedure: Intravascular Pressure Wire/FFR Study;  Surgeon: Leonie Man, MD;  Location: [facility];  Service: Cardiovascular: --  LAD tandem 50% stenoses w/ negative FFR (0.86  . CARDIAC CATHETERIZATION N/A 09/11/2016   Procedure: Coronary Stent Intervention;  Surgeon: Leonie Man, MD;  Location: [facility];  Service: Cardiovascular: -- DES PCI mLCx 90%--0% SYNERGY DES 2.75 MM X 16 MM.  Marland Kitchen CARDIAC CATHETERIZATION  2001   non-obstructive CAD  . CATARACT EXTRACTION W/ INTRAOCULAR LENS  IMPLANT, BILATERAL  July -  August 2017  . DILATION AND CURETTAGE OF UTERUS  X 3  . ESOPHAGOGASTRODUODENOSCOPY  03/2009   ulcer,HP, GERD stricture  . EXCISIONAL HEMORRHOIDECTOMY    . HIP ARTHROPLASTY Left 2010  . JOINT REPLACEMENT    . KNEE ARTHROSCOPY Bilateral   . LAPAROSCOPIC CHOLECYSTECTOMY  2005  . LUMBAR DISC SURGERY  11/1984; 1987; 1989; 2004; 2005 X 2   deg. disk lumber spine   . MOHS SURGERY Right    "inside my lower leg"  .  NASAL SINUS SURGERY  1970s  . NM MYOVIEW LTD  08/28/2016   INTERMEDIATE RISK (b/c reduced EF & ? small infarct) -- EF ~35-40%.  ? Prior Small Inferior-apical & septal infarct (w/o ischemia).  Difuse HK - worse in inferoapex.  Suggest prior infarct with no ischemia. Ischemic area correlates with echo WMA  . REVERSE SHOULDER ARTHROPLASTY Left 12/17/2017   Procedure: LEFT REVERSE SHOULDER ARTHROPLASTY;  Surgeon: Netta Cedars, MD;  Location: [facility];  Service: Orthopedics;  Laterality: Left;  . TOTAL HIP ARTHROPLASTY Right 04/04/2013   Procedure: RIGHT TOTAL HIP ARTHROPLASTY ANTERIOR APPROACH;  Surgeon: Mauri Pole, MD;  Location: [facility];  Service: Orthopedics;  Laterality: Right;  . TOTAL KNEE ARTHROPLASTY Right 2009  . TRANSTHORACIC ECHOCARDIOGRAM  08/2016   mild conc LVH. EF 45-50% - anteroseptal & inferoseptal HK. Mod AI.  Marland Kitchen TRANSTHORACIC ECHOCARDIOGRAM  08/2017   (post PCI) -->  EF normalized:  Normal LV systolic function; mild diastolic dysfunction; mild   LVH; sclerotic aortic valve with mild AI.  No RWMA  . TUBAL LIGATION    . VAGINAL HYSTERECTOMY  1970s   partial    09/11/2016: Post-Intervention Diagram: Mid Cx lesion, 90 %stenosed - SYNERGY DES 2.75X16;  LAD FFR Negative        Current Meds  Medication Sig  . acetaminophen (TYLENOL) 500 MG tablet Take 1,000 mg by mouth 2 (two) times daily as needed for moderate pain or headache.   Marland Kitchen atorvastatin (LIPITOR) 20 MG tablet TAKE 1 TABLET BY MOUTH EVERY DAY  . clopidogrel (PLAVIX) 75 MG tablet TAKE 1 TABLET (75 MG TOTAL) BY MOUTH DAILY WITH BREAKFAST.  . folic acid (FOLVITE) 1 MG tablet Take 1 mg by mouth daily.  . hydrochlorothiazide (HYDRODIURIL) 12.5 MG tablet TAKE 1 TABLET BY MOUTH EVERY DAY WITH LOSARTAN TABLET  . HYDROcodone-acetaminophen (NORCO/VICODIN) 5-325 MG tablet Take 1-2 tablets by mouth every 4 (four) hours as needed for moderate pain or severe pain.  . isosorbide mononitrate (IMDUR) 60 MG 24 hr tablet Take 1 tablet (60 mg  total) by mouth daily.  Marland Kitchen losartan (COZAAR) 50 MG tablet TAKE 1 TABLET BY MOUTH EVERY DAY WITH HCTZ  . Melatonin 3 MG CAPS Take 6 mg by mouth at bedtime.  . methotrexate (50 MG/ML) 1 g injection Inject 40 mg into the vein once a week.   Marland Kitchen oxymetazoline (AFRIN) 0.05 % nasal spray Place 2 sprays into the nose 2 (two) times daily as needed for congestion.  . pantoprazole (PROTONIX) 40 MG tablet TAKE 1 TABLET BY MOUTH EVERY DAY  . raloxifene (EVISTA) 60 MG tablet Take 1 tablet (60 mg total) by mouth daily.    Allergies  Allergen Reactions  . 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 Ace Inhibitors     cough  . Oxaprozin     GI upset   . Pregabalin Swelling    SWELLING REACTION UNSPECIFIED   . Amoxicillin-Pot Clavulanate Nausea And Vomiting    gi upset Has patient had a PCN reaction causing immediate rash, facial/tongue/throat swelling, SOB or lightheadedness with hypotension: No Has patient had a PCN reaction causing severe rash involving mucus membranes or skin necrosis: No Has patient had a PCN reaction that required hospitalization No Has patient had a PCN reaction occurring within the last 10 years: No If all of the above answers are "NO", then may proceed with Cephalosporin use.     Social History   Tobacco Use  . Smoking status: Never Smoker  . Smokeless tobacco: Never Used  Substance Use Topics  . Alcohol use: Yes    Alcohol/week: 0.0 standard drinks    Comment: 09/11/2016 "might have a margarita q 3-4 months"  . Drug use: No   Social History   Social History Narrative  . Not on file    family history includes Arthritis in her mother; Emphysema in her mother; Heart disease in her brother; Hyperlipidemia in her mother; Hypertension in her mother.  Wt Readings from Last 3 Encounters:  08/10/18 178 lb 14.4 oz (81.1 kg)  03/09/18 197 lb 3.2 oz (89.4 kg)  01/22/18 203 lb 4 oz (92.2 kg)    PHYSICAL EXAM BP 114/64   Pulse 77    Ht 5' 4.5" (1.638 m)   Wt 178 lb 14.4 oz (81.1 kg)   BMI 30.23 kg/m  Physical Exam  Constitutional: She is oriented  to person, place, and time. She appears well-developed and well-nourished. No distress.  Healthy-appearing.  Well-groomed.  Appears younger than stated age  HENT:  Head: Normocephalic and atraumatic.  Neck: Normal range of motion. Neck supple. No hepatojugular reflux and no JVD present. Carotid bruit is not present. No thyromegaly present.  Cardiovascular: Normal rate, regular rhythm, S1 normal, S2 normal, intact distal pulses and normal pulses.  No extrasystoles are present. PMI is not displaced. Exam reveals distant heart sounds. Exam reveals no gallop and no friction rub.  Murmur heard.  Medium-pitched harsh crescendo-decrescendo early systolic murmur is present with a grade of 1/6 at the upper right sternal border. Pulmonary/Chest: Breath sounds normal. No respiratory distress. She has no wheezes. She has no rales.  Mild interstitial sounds, but no rales rhonchi or crackles.  Abdominal: Soft. Bowel sounds are normal. She exhibits no distension. There is no abdominal tenderness. There is no rebound.  No HSM.   Musculoskeletal: Normal range of motion.        General: No edema.  Lymphadenopathy:    She has no cervical adenopathy.  Neurological: She is alert and oriented to person, place, and time.  Psychiatric: She has a normal mood and affect. Her behavior is normal. Judgment and thought content normal.  Vitals reviewed.    Adult ECG Report n/a  Other studies Reviewed: Additional studies/ records that were reviewed today include:  Recent Labs:   Lab Results  Component Value Date   CHOL 151 10/12/2017   HDL 44.00 10/12/2017   LDLCALC 52 10/27/2016   LDLDIRECT 74.0 10/12/2017   TRIG 205.0 (H) 10/12/2017   CHOLHDL 3 10/12/2017   Lab Results  Component Value Date   CREATININE 1.03 (H) 12/18/2017   BUN 18 12/18/2017   NA 131 (L) 12/18/2017   K 4.1 12/18/2017     CL 99 (L) 12/18/2017   CO2 23 12/18/2017   CBC Latest Ref Rng & Units 12/18/2017 12/09/2017 10/12/2017  WBC 4.0 - 10.5 K/uL - 10.0 5.5  Hemoglobin 12.0 - 15.0 g/dL 8.7(L) 10.0(L) 10.2(L)  Hematocrit 36.0 - 46.0 % 28.0(L) 32.7(L) 31.6(L)  Platelets 150 - 400 K/uL - 279 243.0    ASSESSMENT / PLAN: Problem List Items Addressed This Visit    Aortic regurgitation (Chronic)    Minimal aortic insufficiency and sclerosis noted on echocardiogram.. No diastolic murmur on exam.  If systolic murmur worsens or diastolic murmur appears, will recheck echo.        CAD S/P percutaneous coronary angioplasty (Chronic)    Circumflex PCI with negative FFR the LAD.  This is treated medically.  Unfortunately, her symptoms did not improve all that much post PCI.  Plan: Continue Plavix without aspirin (now okay to hold 5-days for procedures).  Continue ARB for afterload reduction along with low-dose HCTZ.  She is on low-dose beta-blocker and tolerating it well.  Would not titrate further.  Is on Imdur and increased dose.  No real change in symptoms.       Relevant Orders   Cardiopulmonary exercise test   Pulmonary Function Test   Dyspnea on exertion - Primary (Chronic)    Chronic symptom.  Not improved with PCI of significant circumflex lesion.  LAD did not appear to be significant by FFR.  At this point, I think we need to look for noncardiac etiology for her dyspnea. Plan: PFTs and CPX (Cardiopulmonary Stress Test).      Relevant Orders   Cardiopulmonary exercise test   Pulmonary Function Test  Essential hypertension (Chronic)    On stable dose of ARB and beta-blocker.  Well-controlled.      Hyperlipidemia with low HDL (Chronic)    Borderline goal LDL.  Is due for recheck here in January or February 2020.  Low threshold to convert from atorvastatin 20 mg to rosuvastatin 20 mg if not fully at goal..      Left bundle branch block (LBBB) on electrocardiogram (Chronic)    Chronic.  Stable.   Closely monitor EF.      Obesity (BMI 30.0-34.9) (Chronic)       I spent a total of 25 minutes with the patient and chart review. >  50% of the time was spent in direct patient consultation.   Current medicines are reviewed at length with the patient today.  (+/- concerns) n/a The following changes have been made:  None  Patient Instructions  Medication Instructions:  Not needed If you need a refill on your cardiac medications before your next appointment, please call your pharmacy.   Lab work: Not needed If you have labs (blood work) drawn today and your tests are completely normal, you will receive your results only by: Marland Kitchen MyChart Message (if you have MyChart) OR . A paper copy in the mail If you have any lab test that is abnormal or we need to change your treatment, we will call you to review the results.  Testing/Procedures: Schedule both at [place] physician has recommended that you have a cardiopulmonary stress test (CPX). CPX testing is a non-invasive measurement of heart and lung function. It replaces a traditional treadmill stress test. This type of test provides a tremendous amount of information that relates not only to your present condition but also for future outcomes. This test combines measurements of you ventilation, respiratory gas exchange in the lungs, electrocardiogram (EKG), blood pressure and physical response before, during, and following an exercise protocol. And  Your physician has recommended that you have a pulmonary function test. Pulmonary Function Tests are a group of tests that measure how well air moves in and out of your lungs.     Follow-Up: At [facility], you and your health needs are our priority.  As part of our continuing mission to provide you with exceptional heart care, we have created designated Provider Care Teams.  These Care Teams include your primary Cardiologist (physician) and Advanced Practice Providers (APPs -   Physician Assistants and Nurse Practitioners) who all work together to provide you with the care you need, when you need it. You will need a follow up appointment in 6 months June 2020.  Please call our office 2 months in advance to schedule this appointment.  You may see Glenetta Hew, MD or one of the following Advanced Practice Providers on your designated Care Team:   Rosaria Ferries, PA-C . Jory Sims, DNP, ANP  Any Other Special Instructions Will Be Listed Below (If Applicable).    Studies Ordered:   Orders Placed This Encounter  Procedures  . Cardiopulmonary exercise test  . Pulmonary Function Test      Glenetta Hew, M.D., M.S. Interventional Cardiologist   Pager # [phone] Phone # [phone] [address]. [place], [place] [address]   Thank you for choosing Heart[facility] at [facility]!!

## 2018-08-14 ENCOUNTER — Encounter: Payer: Self-pay | Admitting: Cardiology

## 2018-08-14 NOTE — Assessment & Plan Note (Addendum)
Borderline goal LDL.  Is due for recheck here in January or February 2020.  Low threshold to convert from atorvastatin 20 mg to rosuvastatin 20 mg if not fully at goal..

## 2018-08-14 NOTE — Assessment & Plan Note (Signed)
Circumflex PCI with negative FFR the LAD.  This is treated medically.  Unfortunately, her symptoms did not improve all that much post PCI.  Plan: Continue Plavix without aspirin (now okay to hold 5-days for procedures).  Continue ARB for afterload reduction along with low-dose HCTZ.  She is on low-dose beta-blocker and tolerating it well.  Would not titrate further.  Is on Imdur and increased dose.  No real change in symptoms.

## 2018-08-14 NOTE — Assessment & Plan Note (Signed)
Minimal aortic insufficiency and sclerosis noted on echocardiogram.. No diastolic murmur on exam.  If systolic murmur worsens or diastolic murmur appears, will recheck echo.

## 2018-08-14 NOTE — Assessment & Plan Note (Signed)
Chronic.  Stable.  Closely monitor EF.

## 2018-08-14 NOTE — Assessment & Plan Note (Signed)
Chronic symptom.  Not improved with PCI of significant circumflex lesion.  LAD did not appear to be significant by FFR.  At this point, I think we need to look for noncardiac etiology for her dyspnea. Plan: PFTs and CPX (Cardiopulmonary Stress Test).

## 2018-08-14 NOTE — Assessment & Plan Note (Signed)
On stable dose of ARB and beta-blocker.  Well-controlled.

## 2018-08-22 ENCOUNTER — Encounter: Payer: Self-pay | Admitting: Family Medicine

## 2018-08-22 ENCOUNTER — Ambulatory Visit: Payer: Medicare Other | Admitting: Family Medicine

## 2018-08-22 VITALS — BP 126/72 | HR 68 | Temp 97.7°F | Ht 64.5 in | Wt 178.8 lb

## 2018-08-22 DIAGNOSIS — L304 Erythema intertrigo: Secondary | ICD-10-CM | POA: Diagnosis not present

## 2018-08-22 DIAGNOSIS — B373 Candidiasis of vulva and vagina: Secondary | ICD-10-CM | POA: Insufficient documentation

## 2018-08-22 DIAGNOSIS — B3731 Acute candidiasis of vulva and vagina: Secondary | ICD-10-CM

## 2018-08-22 LAB — POCT WET PREP (WET MOUNT): TRICHOMONAS WET PREP HPF POC: ABSENT

## 2018-08-22 MED ORDER — NYSTATIN 100000 UNIT/GM EX POWD: Freq: Two times a day (BID) | CUTANEOUS | 1 refills | Status: DC | PRN

## 2018-08-22 MED ORDER — KETOCONAZOLE 2 % EX CREA: 1.0000 "application " | TOPICAL_CREAM | Freq: Every day | CUTANEOUS | 1 refills | Status: DC

## 2018-08-22 MED ORDER — FLUCONAZOLE 150 MG PO TABS: ORAL_TABLET | ORAL | 0 refills | Status: DC

## 2018-08-22 NOTE — Assessment & Plan Note (Signed)
Worse in R inguinal fold (skin breakdown)  D/c lotrisone (do not want steroid) Px ketoconazole cream 2% to use instead  Keep area very clean and dry  In between flares-diflucan powder px  Update if not starting to improve in a week or if worsening

## 2018-08-22 NOTE — Progress Notes (Signed)
Subjective:    Patient ID: [name] [name], female    DOB: 11-29-1940, 77 y.o.   MRN: [id number]  HPI  Here for symptoms of vaginitis  Wt Readings from Last 3 Encounters:  08/22/18 178 lb 12 oz (81.1 kg)  08/10/18 178 lb 14.4 oz (81.1 kg)  03/09/18 197 lb 3.2 oz (89.4 kg)   Flu vaccine   Symptoms since Tuesday  Has used cream for under breasts/thighs (lotrisone)  Area in R thigh fold is irritated and broken down  Vaginal area itches and burns No d/c that she can tell  No pain or pelvic pain  No lesions or wounds   Used some vaseline- helped soothe it  Most of symptoms are external    Wet prep result today Results for orders placed or performed in visit on 08/22/18  POCT Wet Prep [employer] [employer] [facility])  Result Value Ref Range   Source Wet Prep POC vaginal    WBC, Wet Prep HPF POC many    Bacteria Wet Prep HPF POC Few Few   BACTERIA WET PREP MORPHOLOGY POC     Clue Cells Wet Prep HPF POC None None   Clue Cells Wet Prep Whiff POC     Yeast Wet Prep HPF POC Moderate (A) None   KOH Wet Prep POC Moderate (A) None   Trichomonas Wet Prep HPF POC Absent Absent      Patient Active Problem List   Diagnosis Date Noted  . Yeast vaginitis 08/22/2018  . 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 Routine general medical examination at a health care facility 10/14/2017  . Preop cardiovascular exam 10/01/2017  . CAD S/P percutaneous coronary angioplasty 01/27/2017  . Aortic regurgitation 01/27/2017  . Dyspnea on exertion 08/19/2016  . Left bundle branch block (LBBB) on electrocardiogram 08/19/2016  . Steroid-induced hyperglycemia 10/10/2015  . Estrogen deficiency 10/08/2015  . Chronic fatigue 08/20/2015  . Deficiency anemia 11/17/2013  . Encounter for Medicare annual wellness exam 07/17/2013  . Muscle pain 07/17/2013  . PMR (polymyalgia rheumatica) (HCC) 07/17/2013  . Intertrigo 04/19/2013  . Expected blood loss anemia 04/05/2013  . Obesity (BMI 30.0-34.9) 04/05/2013  . S/P right  THA, AA 04/04/2013  . IBS (irritable bowel syndrome) 05/30/2012  . Anemia of chronic disease 04/16/2010  . ANXIETY 04/16/2010  . DYSPHAGIA 03/07/2009  . ESOPHAGEAL STRICTURE 03/04/2009  . GERD 03/04/2009  . Vitamin D deficiency 03/12/2008  . URINARY TRACT INFECTION, RECURRENT 08/07/2007  . 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 FIBROCYSTIC BREAST DISEASE, HX OF 02/11/2007  . Hyperlipidemia with low HDL 01/10/2007  . DEPRESSION 01/10/2007  . RETINAL VEIN OCCLUSION 01/10/2007  . Essential hypertension 01/10/2007  . PEPTIC ULCER DISEASE 01/10/2007  . GALLSTONE PANCREATITIS 01/10/2007  . 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 SKIN CANCER, HX OF 01/10/2007   Past Medical History:  Diagnosis Date  . Anemia    of chronic disease  . Anxiety   . Arthritis    "psoratic arthritis; new dx" (09/11/2016)  . CAD S/P percutaneous coronary angioplasty 08/2016   mLCx 90% -> 0% (2.75 mm x 16 mm) Synergy DES PCI Ostial D1 70% (not PTCA target). Moderate ostial and mD2.  LAD tandem 50% stenoses w/ negative FFR (0.86)  . Cancer ([place])    skin cancer on leg  . Chronic lower back pain   . Complication of anesthesia 1980 and 1982   trouble waking up after breast biopsy, many surgeries since no problem  . Depression   . Dyspnea on effort -  CHRONIC    partial relief of Sx post PCI of L Circumflex.  . Fibrocystic breast   . GERD (gastroesophageal reflux disease)   . Headache    "monthly" (09/11/2016)  . History of blood transfusion    "several since age 51; I've had them after most of my surgeries" (09/11/2016)  . History of myocardial infarction    "doctor said there are signs I've had a heart attack; don't know when"  - Myoview [name] 2018 indicated small Inferior - Inferoseptal Infarct (vs. rest ischemia). Echo with inferoseptal hypokinesis.  Marland Kitchen Hx of skin cancer, basal cell 2013 and 2012   right inner, lower leg; several scattered around my body  . Hypertension   . Iron deficiency anemia    "I've had an  iron infusion"  . Osteoarthritis   . Osteopenia   . Peptic ulcer disease    H pylori  . PMR (polymyalgia rheumatica) (HCC)   . PONV (postoperative nausea and vomiting)   . Psoriasis    Past Surgical History:  Procedure Laterality Date  . APPENDECTOMY    . BACK SURGERY     x 6   . BREAST BIOPSY Left 1980 and 1982   "benign"  . CARDIAC CATHETERIZATION N/A 09/11/2016   Procedure: Left Heart Cath and Coronary Angiography;  Surgeon: Leonie Man, MD;  Location: [facility];  Service: Cardiovascular: mLCx 90% * (PCI). Ostial D1 70% (not PTCA target). Mod ostial and mD2.  LAD tandem 50% stenoses w/ negative FFR (0.86)  . CARDIAC CATHETERIZATION N/A 09/11/2016   Procedure: Intravascular Pressure Wire/FFR Study;  Surgeon: Leonie Man, MD;  Location: [facility];  Service: Cardiovascular: --  LAD tandem 50% stenoses w/ negative FFR (0.86  . CARDIAC CATHETERIZATION N/A 09/11/2016   Procedure: Coronary Stent Intervention;  Surgeon: Leonie Man, MD;  Location: [facility];  Service: Cardiovascular: -- DES PCI mLCx 90%--0% SYNERGY DES 2.75 MM X 16 MM.  Marland Kitchen CARDIAC CATHETERIZATION  2001   non-obstructive CAD  . CATARACT EXTRACTION W/ INTRAOCULAR LENS  IMPLANT, BILATERAL  July -  August 2017  . DILATION AND CURETTAGE OF UTERUS  X 3  . ESOPHAGOGASTRODUODENOSCOPY  03/2009   ulcer,HP, GERD stricture  . EXCISIONAL HEMORRHOIDECTOMY    . HIP ARTHROPLASTY Left 2010  . JOINT REPLACEMENT    . KNEE ARTHROSCOPY Bilateral   . LAPAROSCOPIC CHOLECYSTECTOMY  2005  . LUMBAR DISC SURGERY  11/1984; 1987; 1989; 2004; 2005 X 2   deg. disk lumber spine   . MOHS SURGERY Right    "inside my lower leg"  . NASAL SINUS SURGERY  1970s  . NM MYOVIEW LTD  08/28/2016   INTERMEDIATE RISK (b/c reduced EF & ? small infarct) -- EF ~35-40%.  ? Prior Small Inferior-apical & septal infarct (w/o ischemia).  Difuse HK - worse in inferoapex.  Suggest prior infarct with no ischemia. Ischemic area correlates  with echo WMA  . REVERSE SHOULDER ARTHROPLASTY Left 12/17/2017   Procedure: LEFT REVERSE SHOULDER ARTHROPLASTY;  Surgeon: Netta Cedars, MD;  Location: [facility];  Service: Orthopedics;  Laterality: Left;  . TOTAL HIP ARTHROPLASTY Right 04/04/2013   Procedure: RIGHT TOTAL HIP ARTHROPLASTY ANTERIOR APPROACH;  Surgeon: Mauri Pole, MD;  Location: [facility];  Service: Orthopedics;  Laterality: Right;  . TOTAL KNEE ARTHROPLASTY Right 2009  . TRANSTHORACIC ECHOCARDIOGRAM  08/2016   mild conc LVH. EF 45-50% - anteroseptal & inferoseptal HK. Mod AI.  Marland Kitchen TRANSTHORACIC ECHOCARDIOGRAM  08/2017   (  post PCI) --> EF normalized:  Normal LV systolic function; mild diastolic dysfunction; mild   LVH; sclerotic aortic valve with mild AI.  No RWMA  . TUBAL LIGATION    . VAGINAL HYSTERECTOMY  1970s   partial    Social History   Tobacco Use  . Smoking status: Never Smoker  . Smokeless tobacco: Never Used  Substance Use Topics  . Alcohol use: Yes    Alcohol/week: 0.0 standard drinks    Comment: 09/11/2016 "might have a margarita q 3-4 months"  . Drug use: No   Family History  Problem Relation Age of Onset  . Arthritis Mother   . Emphysema Mother   . Hyperlipidemia Mother   . Hypertension Mother   . Heart disease Brother        CAD   Allergies  Allergen Reactions  . 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 Ace Inhibitors     cough  . Oxaprozin     GI upset   . Pregabalin Swelling    SWELLING REACTION UNSPECIFIED   . Amoxicillin-Pot Clavulanate Nausea And Vomiting    gi upset Has patient had a PCN reaction causing immediate rash, facial/tongue/throat swelling, SOB or lightheadedness with hypotension: No Has patient had a PCN reaction causing severe rash involving mucus membranes or skin necrosis: No Has patient had a PCN reaction that required hospitalization No Has patient had a PCN reaction occurring within the last 10 years: No If all of the above answers  are "NO", then may proceed with Cephalosporin use.    Current Outpatient Medications on File Prior to Visit  Medication Sig Dispense Refill  . acetaminophen (TYLENOL) 500 MG tablet Take 1,000 mg by mouth 2 (two) times daily as needed for moderate pain or headache.     Marland Kitchen atorvastatin (LIPITOR) 20 MG tablet TAKE 1 TABLET BY MOUTH EVERY DAY 90 tablet 1  . clopidogrel (PLAVIX) 75 MG tablet TAKE 1 TABLET (75 MG TOTAL) BY MOUTH DAILY WITH BREAKFAST. 30 tablet 6  . folic acid (FOLVITE) 1 MG tablet Take 1 mg by mouth daily.    . hydrochlorothiazide (HYDRODIURIL) 12.5 MG tablet TAKE 1 TABLET BY MOUTH EVERY DAY WITH LOSARTAN TABLET 90 tablet 1  . HYDROcodone-acetaminophen (NORCO/VICODIN) 5-325 MG tablet Take 1-2 tablets by mouth every 4 (four) hours as needed for moderate pain or severe pain. 30 tablet 0  . isosorbide mononitrate (IMDUR) 60 MG 24 hr tablet Take 1 tablet (60 mg total) by mouth daily. 30 tablet 3  . losartan (COZAAR) 50 MG tablet TAKE 1 TABLET BY MOUTH EVERY DAY WITH HCTZ 90 tablet 1  . Melatonin 3 MG CAPS Take 6 mg by mouth at bedtime.    . methotrexate (50 MG/ML) 1 g injection Inject 40 mg into the vein once a week.     Marland Kitchen oxymetazoline (AFRIN) 0.05 % nasal spray Place 2 sprays into the nose 2 (two) times daily as needed for congestion.    . pantoprazole (PROTONIX) 40 MG tablet TAKE 1 TABLET BY MOUTH EVERY DAY 90 tablet 1  . raloxifene (EVISTA) 60 MG tablet Take 1 tablet (60 mg total) by mouth daily. 90 tablet 3  . metoprolol tartrate (LOPRESSOR) 25 MG tablet Take 1 tablet (25 mg total) by mouth 2 (two) times daily. 180 tablet 3  . nitroGLYCERIN (NITROSTAT) 0.4 MG SL tablet Place 1 tablet (0.4 mg total) under the tongue every 5 (five) minutes as needed for chest pain. 25  tablet 5   No current facility-administered medications on file prior to visit.     Review of Systems  Constitutional: Negative for activity change, appetite change, fatigue, fever and unexpected weight change.  HENT:  Negative for congestion, ear pain, rhinorrhea, sinus pressure and sore throat.   Eyes: Negative for pain, redness and visual disturbance.  Respiratory: Negative for cough, shortness of breath and wheezing.   Cardiovascular: Negative for chest pain and palpitations.  Gastrointestinal: Negative for abdominal pain, blood in stool, constipation and diarrhea.  Endocrine: Negative for polydipsia and polyuria.  Genitourinary: Positive for vaginal pain. Negative for dysuria, flank pain, frequency, pelvic pain, urgency, vaginal bleeding and vaginal discharge.       Vaginal itching- mostly external   Musculoskeletal: Positive for arthralgias and back pain. Negative for myalgias.  Skin: Negative for pallor and rash.       Intermittent yeast rash under breasts and in skin folds of thighs Today present in R thigh   Allergic/Immunologic: Negative for environmental allergies.  Neurological: Negative for dizziness, syncope and headaches.  Hematological: Negative for adenopathy. Does not bruise/bleed easily.  Psychiatric/Behavioral: Negative for decreased concentration and dysphoric mood. The patient is not nervous/anxious.        Objective:   Physical Exam Constitutional:      General: She is not in acute distress.    Appearance: Normal appearance. She is obese. She is not ill-appearing.  HENT:     Head: Normocephalic and atraumatic.     Mouth/Throat:     Mouth: Mucous membranes are moist.     Pharynx: Oropharynx is clear.  Eyes:     Conjunctiva/sclera: Conjunctivae normal.     Pupils: Pupils are equal, round, and reactive to light.  Neck:     Musculoskeletal: Normal range of motion.  Cardiovascular:     Rate and Rhythm: Normal rate and regular rhythm.  Pulmonary:     Effort: Pulmonary effort is normal.     Breath sounds: Normal breath sounds.  Abdominal:     General: Bowel sounds are normal.     Palpations: Abdomen is soft.     Tenderness: There is no abdominal tenderness.     Comments:  No suprapubic tenderness or fullness    Genitourinary:    Comments: Vulva is hyperemic No skin breakdown or lesions No vaginal d/c  Wet prep obtained Lymphadenopathy:     Cervical: No cervical adenopathy.  Skin:    General: Skin is warm and dry.     Findings: Erythema present.     Comments: Active yeast intertrigo in R inguinal area with redness and satellite lesions Also some skin breakdown  No signs of bacterial infection     Neurological:     Mental Status: She is alert.  Psychiatric:        Mood and Affect: Mood normal.           Assessment & Plan:   Problem List Items Addressed This Visit      Musculoskeletal and Integument   Intertrigo    Worse in R inguinal fold (skin breakdown)  D/c lotrisone (do not want steroid) Px ketoconazole cream 2% to use instead  Keep area very clean and dry  In between flares-diflucan powder px  Update if not starting to improve in a week or if worsening          Genitourinary   Yeast vaginitis - Primary    With hyphae on wet prep  Disc getting air to the area/staying  clean and dry Diflucan 150 mg every 3 d for three doses Ketoconazole cream externally prn (also for intertrigo)  Update if not starting to improve in a week or if worsening        Relevant Medications   fluconazole (DIFLUCAN) 150 MG tablet   ketoconazole (NIZORAL) 2 % cream   nystatin (MYCOSTATIN/NYSTOP) powder   Other Relevant Orders   POCT Wet Prep [facility]) (Completed)

## 2018-08-22 NOTE — Patient Instructions (Signed)
Take the oral diflucan as directed for yeast vaginitis   Use ketoconazole cream in thigh/breast areas as needed Keep clean and dry (gentle cleansing) In between breakouts you can use the nystatin powder also   Update if not starting to improve in a week or if worsening    Update if not starting to improve in a week or if worsening

## 2018-08-22 NOTE — Assessment & Plan Note (Signed)
With hyphae on wet prep  Disc getting air to the area/staying clean and dry Diflucan 150 mg every 3 d for three doses Ketoconazole cream externally prn (also for intertrigo)  Update if not starting to improve in a week or if worsening

## 2018-08-27 ENCOUNTER — Other Ambulatory Visit: Payer: Self-pay | Admitting: Cardiology

## 2018-08-29 ENCOUNTER — Ambulatory Visit (HOSPITAL_COMMUNITY): Payer: Medicare Other | Attending: Cardiology

## 2018-08-29 DIAGNOSIS — R0609 Other forms of dyspnea: Secondary | ICD-10-CM | POA: Diagnosis present

## 2018-08-29 DIAGNOSIS — Z9861 Coronary angioplasty status: Secondary | ICD-10-CM | POA: Diagnosis not present

## 2018-08-29 DIAGNOSIS — I2510 Atherosclerotic heart disease of native coronary artery without angina pectoris: Secondary | ICD-10-CM | POA: Diagnosis not present

## 2018-08-29 DIAGNOSIS — R0600 Dyspnea, unspecified: Secondary | ICD-10-CM

## 2018-09-01 ENCOUNTER — Ambulatory Visit (HOSPITAL_COMMUNITY)
Admission: RE | Admit: 2018-09-01 | Discharge: 2018-09-01 | Disposition: A | Discharge status: Home / Self Care | Payer: Medicare Other | Source: Ambulatory Visit | Attending: Cardiology | Admitting: Cardiology

## 2018-09-01 DIAGNOSIS — I2510 Atherosclerotic heart disease of native coronary artery without angina pectoris: Secondary | ICD-10-CM | POA: Diagnosis present

## 2018-09-01 DIAGNOSIS — Z9861 Coronary angioplasty status: Secondary | ICD-10-CM | POA: Diagnosis present

## 2018-09-01 DIAGNOSIS — R0609 Other forms of dyspnea: Secondary | ICD-10-CM | POA: Diagnosis present

## 2018-09-01 LAB — PULMONARY FUNCTION TEST
FEF 25-75 Pre: 1.63 L/sec
FEF2575-%Pred-Pre: 109 %
FEV1-%Pred-Pre: 86 %
FEV1-Pre: 1.75 L
FEV1FVC-%Pred-Pre: 107 %
FEV6-%Pred-Pre: 84 %
FEV6-Pre: 2.18 L
FEV6FVC-%Pred-Pre: 104 %
FVC-%Pred-Pre: 81 %
FVC-Pre: 2.2 L
Pre FEV1/FVC ratio: 80 %
Pre FEV6/FVC Ratio: 99 %

## 2018-09-04 ENCOUNTER — Other Ambulatory Visit: Payer: Self-pay | Admitting: Cardiology

## 2018-09-06 ENCOUNTER — Telehealth: Payer: Self-pay | Admitting: Cardiology

## 2018-09-06 NOTE — Telephone Encounter (Addendum)
Pt aware of PFT and Cardiopulmonary exercise test results with verbal understanding.  Notes recorded by Leonie Man, MD on 08/31/2018 at 6:32 PM EST CPX results: Study indicates that she is notably debilitated and was unable to exercise long enough to gain any meaningful data from exercise testing. Would recommend PT/OT referral. Overall deconditioning and her body habitus seem to be main limitation at this point and not a cardiac limitation.  Notes recorded by Leonie Man, MD on 09/04/2018 at 9:57 AM EST Pulmonary function tests appear to be relatively normal.  Pt sts that she is not interested in being ref to PT/OT. She will attempt to increase activity on her own. Adv her that I will fwd the update to Mart.

## 2018-09-06 NOTE — Telephone Encounter (Signed)
Okay that sounds fine.  Glenetta Hew, MD

## 2018-09-06 NOTE — Telephone Encounter (Signed)
New message    Pt is calling for results from tests she had done last week  Stress test on Monday and Lung Function test on Thursday  Please call

## 2018-09-12 ENCOUNTER — Telehealth: Payer: Self-pay | Admitting: *Deleted

## 2018-09-12 MED ORDER — ALPRAZOLAM 0.25 MG PO TABS: 0.2500 mg | ORAL_TABLET | Freq: Two times a day (BID) | ORAL | 0 refills | Status: DC | PRN

## 2018-09-12 NOTE — Telephone Encounter (Signed)
I sent xanax in  Use with caution of sedation and falls It is also habit forming  Just take when needed Do not drive with it   Let her know we are thinking about she and her family

## 2018-09-12 NOTE — Telephone Encounter (Signed)
Spoke to pt who states her husband passed on yesterday and she is wanting to know if she can "have something to take the edge off" to help her "get over these last few days." If so, Rx can be sent to [facility]. pls advise

## 2018-09-13 MED ORDER — ALPRAZOLAM 0.25 MG PO TABS: 0.2500 mg | ORAL_TABLET | Freq: Two times a day (BID) | ORAL | 0 refills | Status: DC | PRN

## 2018-09-13 NOTE — Telephone Encounter (Signed)
[facility] did [facility] an it's ready for pick up and pt notified

## 2018-09-13 NOTE — Telephone Encounter (Signed)
Called pt and advise pt of Dr. Marliss Coots comments. Pt said that no [facility] around has the .25[facility]  of xanax and the only [facility] [address]. Pt request Dr. Glori Bickers resend Rx to them. [facility] [address] cant xfer the med because it's a controlled sub  Please [facility]ax to [facility]

## 2018-09-13 NOTE — Telephone Encounter (Signed)
I sent it  

## 2018-09-15 ENCOUNTER — Other Ambulatory Visit: Payer: Self-pay | Admitting: Cardiology

## 2018-09-16 ENCOUNTER — Ambulatory Visit: Payer: Medicare Other | Admitting: Family Medicine

## 2018-09-16 ENCOUNTER — Other Ambulatory Visit: Payer: Self-pay | Admitting: Cardiology

## 2018-09-16 ENCOUNTER — Encounter: Payer: Self-pay | Admitting: Family Medicine

## 2018-09-16 VITALS — BP 116/70 | HR 75 | Temp 97.8°F | Ht 64.5 in | Wt 175.2 lb

## 2018-09-16 DIAGNOSIS — D638 Anemia in other chronic diseases classified elsewhere: Secondary | ICD-10-CM

## 2018-09-16 DIAGNOSIS — J069 Acute upper respiratory infection, unspecified: Secondary | ICD-10-CM

## 2018-09-16 DIAGNOSIS — Z9861 Coronary angioplasty status: Secondary | ICD-10-CM

## 2018-09-16 DIAGNOSIS — I2510 Atherosclerotic heart disease of native coronary artery without angina pectoris: Secondary | ICD-10-CM | POA: Diagnosis not present

## 2018-09-16 DIAGNOSIS — F4320 Adjustment disorder, unspecified: Secondary | ICD-10-CM

## 2018-09-16 DIAGNOSIS — I10 Essential (primary) hypertension: Secondary | ICD-10-CM | POA: Diagnosis not present

## 2018-09-16 DIAGNOSIS — M353 Polymyalgia rheumatica: Secondary | ICD-10-CM

## 2018-09-16 DIAGNOSIS — R0609 Other forms of dyspnea: Secondary | ICD-10-CM

## 2018-09-16 DIAGNOSIS — F4321 Adjustment disorder with depressed mood: Secondary | ICD-10-CM

## 2018-09-16 MED ORDER — BENZONATATE 200 MG PO CAPS: 200.0000 mg | ORAL_CAPSULE | Freq: Three times a day (TID) | ORAL | 1 refills | Status: DC | PRN

## 2018-09-16 NOTE — Progress Notes (Signed)
Subjective:    Patient ID: [name] [name], female    DOB: 13-Apr-1941, 78 y.o.   MRN: [id number]  HPI  Here with ST and hoarseness  Daughters are here with several concerns as well  She lost her husband this week   Wt Readings from Last 3 Encounters:  09/16/18 175 lb 4 oz (79.5 kg)  08/22/18 178 lb 12 oz (81.1 kg)  08/10/18 178 lb 14.4 oz (81.1 kg)   29.62 kg/m     Very scratchy throat  Started tues nt  Yesterday- sounded bad  No fever  Runny nose-clear  Also pnd   Hurts to swallow but not severe   Some cough  Non productive   Occasional headache  Just lost her husband Exposed to a lot of people  Not getting a lot of sleep   Tylenol otc Delsym   Lab Results  Component Value Date   WBC 10.0 12/09/2017   HGB 8.7 (L) 12/18/2017   HCT 28.0 (L) 12/18/2017   MCV 95.3 12/09/2017   PLT 279 12/09/2017   has not seen the hematologist in a long time  Was not getting iron infusions (not for a long time)  Lab Results  Component Value Date   FERRITIN 149 10/14/2017     Not eating a lot  Not drinking enough?   Wants to check cbc  Cardiology was interested in starting cardiopulmonary rehab ? If ins will cover   She is irritable in the evenings  Family wonders about depression  Family has concerns of congnitive status   Patient Active Problem List   Diagnosis Date Noted  . Grief reaction 09/17/2018  . URI with cough and congestion 09/16/2018  . Yeast vaginitis 08/22/2018  . 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 Routine general medical examination at a health care facility 10/14/2017  . Preop cardiovascular exam 10/01/2017  . CAD S/P percutaneous coronary angioplasty 01/27/2017  . Aortic regurgitation 01/27/2017  . Dyspnea on exertion 08/19/2016  . Left bundle branch block (LBBB) on electrocardiogram 08/19/2016  . Steroid-induced hyperglycemia 10/10/2015  . Estrogen deficiency 10/08/2015  . Chronic fatigue 08/20/2015  . Deficiency anemia  11/17/2013  . Encounter for Medicare annual wellness exam 07/17/2013  . Muscle pain 07/17/2013  . PMR (polymyalgia rheumatica) (HCC) 07/17/2013  . Intertrigo 04/19/2013  . Expected blood loss anemia 04/05/2013  . Obesity (BMI 30.0-34.9) 04/05/2013  . 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 IBS (irritable bowel syndrome) 05/30/2012  . Anemia of chronic disease 04/16/2010  . ANXIETY 04/16/2010  . GERD 03/04/2009  . Vitamin D deficiency 03/12/2008  . URINARY TRACT INFECTION, RECURRENT 08/07/2007  . 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 FIBROCYSTIC BREAST DISEASE, HX OF 02/11/2007  . Hyperlipidemia with low HDL 01/10/2007  . DEPRESSION 01/10/2007  . RETINAL VEIN OCCLUSION 01/10/2007  . Essential hypertension 01/10/2007  . PEPTIC ULCER DISEASE 01/10/2007  . 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 SKIN CANCER, HX OF 01/10/2007   Past Medical History:  Diagnosis Date  . Anemia    of chronic disease  . Anxiety   . Arthritis    "psoratic arthritis; new dx" (09/11/2016)  . CAD S/P percutaneous coronary angioplasty 08/2016   mLCx 90% -> 0% (2.75 mm x 16 mm) Synergy DES PCI Ostial D1 70% (not PTCA target). Moderate ostial and mD2.  LAD tandem 50% stenoses w/ negative FFR (0.86)  . Cancer ([place]    skin cancer on leg  . Chronic lower back pain   . Complication of  anesthesia 1980 and 1982   trouble waking up after breast biopsy, many surgeries since no problem  . Depression   . Dyspnea on effort - CHRONIC    partial relief of Sx post PCI of L Circumflex.  . Fibrocystic breast   . GERD (gastroesophageal reflux disease)   . Headache    "monthly" (09/11/2016)  . History of blood transfusion    "several since age 82; I've had them after most of my surgeries" (09/11/2016)  . History of myocardial infarction    "doctor said there are signs I've had a heart attack; don't know when"  - Myoview [name] 2018 indicated small Inferior - Inferoseptal Infarct (vs. rest ischemia). Echo with inferoseptal hypokinesis.    Marland Kitchen Hx of skin cancer, basal cell 2013 and 2012   right inner, lower leg; several scattered around my body  . Hypertension   . Iron deficiency anemia    "I've had an iron infusion"  . Osteoarthritis   . Osteopenia   . Peptic ulcer disease    H pylori  . PMR (polymyalgia rheumatica) (HCC)   . PONV (postoperative nausea and vomiting)   . Psoriasis    Past Surgical History:  Procedure Laterality Date  . APPENDECTOMY    . BACK SURGERY     x 6   . BREAST BIOPSY Left 1980 and 1982   "benign"  . CARDIAC CATHETERIZATION N/A 09/11/2016   Procedure: Left Heart Cath and Coronary Angiography;  Surgeon: Leonie Man, MD;  Location: [facility];  Service: Cardiovascular: mLCx 90% * (PCI). Ostial D1 70% (not PTCA target). Mod ostial and mD2.  LAD tandem 50% stenoses w/ negative FFR (0.86)  . CARDIAC CATHETERIZATION N/A 09/11/2016   Procedure: Intravascular Pressure Wire/FFR Study;  Surgeon: Leonie Man, MD;  Location: [facility];  Service: Cardiovascular: --  LAD tandem 50% stenoses w/ negative FFR (0.86  . CARDIAC CATHETERIZATION N/A 09/11/2016   Procedure: Coronary Stent Intervention;  Surgeon: Leonie Man, MD;  Location: [facility];  Service: Cardiovascular: -- DES PCI mLCx 90%--0% SYNERGY DES 2.75 MM X 16 MM.  Marland Kitchen CARDIAC CATHETERIZATION  2001   non-obstructive CAD  . CATARACT EXTRACTION W/ INTRAOCULAR LENS  IMPLANT, BILATERAL  July -  August 2017  . DILATION AND CURETTAGE OF UTERUS  X 3  . ESOPHAGOGASTRODUODENOSCOPY  03/2009   ulcer,HP, GERD stricture  . EXCISIONAL HEMORRHOIDECTOMY    . HIP ARTHROPLASTY Left 2010  . JOINT REPLACEMENT    . KNEE ARTHROSCOPY Bilateral   . LAPAROSCOPIC CHOLECYSTECTOMY  2005  . LUMBAR DISC SURGERY  11/1984; 1987; 1989; 2004; 2005 X 2   deg. disk lumber spine   . MOHS SURGERY Right    "inside my lower leg"  . NASAL SINUS SURGERY  1970s  . NM MYOVIEW LTD  08/28/2016   INTERMEDIATE RISK (b/c reduced EF & ? small infarct) -- EF  ~35-40%.  ? Prior Small Inferior-apical & septal infarct (w/o ischemia).  Difuse HK - worse in inferoapex.  Suggest prior infarct with no ischemia. Ischemic area correlates with echo WMA  . REVERSE SHOULDER ARTHROPLASTY Left 12/17/2017   Procedure: LEFT REVERSE SHOULDER ARTHROPLASTY;  Surgeon: Netta Cedars, MD;  Location: [facility];  Service: Orthopedics;  Laterality: Left;  . TOTAL HIP ARTHROPLASTY Right 04/04/2013   Procedure: RIGHT TOTAL HIP ARTHROPLASTY ANTERIOR APPROACH;  Surgeon: Mauri Pole, MD;  Location: [facility];  Service: Orthopedics;  Laterality: Right;  . TOTAL KNEE ARTHROPLASTY Right  2009  . TRANSTHORACIC ECHOCARDIOGRAM  08/2016   mild conc LVH. EF 45-50% - anteroseptal & inferoseptal HK. Mod AI.  Marland Kitchen TRANSTHORACIC ECHOCARDIOGRAM  08/2017   (post PCI) --> EF normalized:  Normal LV systolic function; mild diastolic dysfunction; mild   LVH; sclerotic aortic valve with mild AI.  No RWMA  . TUBAL LIGATION    . VAGINAL HYSTERECTOMY  1970s   partial    Social History   Tobacco Use  . Smoking status: Never Smoker  . Smokeless tobacco: Never Used  Substance Use Topics  . Alcohol use: Yes    Alcohol/week: 0.0 standard drinks    Comment: 09/11/2016 "might have a margarita q 3-4 months"  . Drug use: No   Family History  Problem Relation Age of Onset  . Arthritis Mother   . Emphysema Mother   . Hyperlipidemia Mother   . Hypertension Mother   . Heart disease Brother        CAD   Allergies  Allergen Reactions  . 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 Ace Inhibitors     cough  . Oxaprozin     GI upset   . Pregabalin Swelling    SWELLING REACTION UNSPECIFIED   . Amoxicillin-Pot Clavulanate Nausea And Vomiting    gi upset Has patient had a PCN reaction causing immediate rash, facial/tongue/throat swelling, SOB or lightheadedness with hypotension: No Has patient had a PCN reaction causing severe rash involving mucus membranes or skin  necrosis: No Has patient had a PCN reaction that required hospitalization No Has patient had a PCN reaction occurring within the last 10 years: No If all of the above answers are "NO", then may proceed with Cephalosporin use.    Current Outpatient Medications on File Prior to Visit  Medication Sig Dispense Refill  . acetaminophen (TYLENOL) 500 MG tablet Take 1,000 mg by mouth 2 (two) times daily as needed for moderate pain or headache.     . ALPRAZolam (XANAX) 0.25 MG tablet Take 1-2 tablets (0.25-0.5 mg total) by mouth 2 (two) times daily as needed for anxiety. 40 tablet 0  . atorvastatin (LIPITOR) 20 MG tablet TAKE 1 TABLET BY MOUTH EVERY DAY 90 tablet 1  . [START ON 11/04/2018] clopidogrel (PLAVIX) 75 MG tablet Take 1 tablet (75 mg total) by mouth daily with breakfast. 90 tablet 3  . fluconazole (DIFLUCAN) 150 MG tablet Take one pill by mouth every 3 days 3 tablet 0  . folic acid (FOLVITE) 1 MG tablet Take 1 mg by mouth daily.    . hydrochlorothiazide (HYDRODIURIL) 12.5 MG tablet TAKE 1 TABLET BY MOUTH EVERY DAY WITH LOSARTAN TABLET 90 tablet 1  . HYDROcodone-acetaminophen (NORCO/VICODIN) 5-325 MG tablet Take 1-2 tablets by mouth every 4 (four) hours as needed for moderate pain or severe pain. 30 tablet 0  . isosorbide mononitrate (IMDUR) 60 MG 24 hr tablet TAKE 1 TABLET BY MOUTH EVERY DAY 90 tablet 3  . ketoconazole (NIZORAL) 2 % cream Apply 1 application topically daily. To external yeast rash area 30 g 1  . losartan (COZAAR) 50 MG tablet TAKE 1 TABLET BY MOUTH EVERY DAY WITH HCTZ 90 tablet 1  . Melatonin 3 MG CAPS Take 6 mg by mouth at bedtime.    . methotrexate (50 MG/ML) 1 g injection Inject 40 mg into the vein once a week.     . nystatin (MYCOSTATIN/NYSTOP) powder Apply topically 2 (two) times daily as needed. To affected areas  to prevent yeast (in skin folds) 30 g 1  . pantoprazole (PROTONIX) 40 MG tablet TAKE 1 TABLET BY MOUTH EVERY DAY 90 tablet 1  . raloxifene (EVISTA) 60 MG tablet  Take 1 tablet (60 mg total) by mouth daily. 90 tablet 3  . metoprolol tartrate (LOPRESSOR) 25 MG tablet Take 1 tablet (25 mg total) by mouth 2 (two) times daily. 180 tablet 3  . nitroGLYCERIN (NITROSTAT) 0.4 MG SL tablet Place 1 tablet (0.4 mg total) under the tongue every 5 (five) minutes as needed for chest pain. 25 tablet 5   No current facility-administered medications on file prior to visit.     Review of Systems  Constitutional: Positive for appetite change and fatigue. Negative for fever.  HENT: Positive for congestion, postnasal drip, rhinorrhea, sinus pressure, sneezing, sore throat and voice change. Negative for ear pain.   Eyes: Negative for pain and discharge.  Respiratory: Positive for cough and shortness of breath. Negative for chest tightness, wheezing and stridor.   Cardiovascular: Negative for chest pain, palpitations and leg swelling.  Gastrointestinal: Negative for diarrhea, nausea and vomiting.  Genitourinary: Negative for frequency, hematuria and urgency.  Musculoskeletal: Negative for arthralgias and myalgias.  Skin: Negative for rash.  Neurological: Positive for headaches. Negative for dizziness, weakness and light-headedness.  Psychiatric/Behavioral: Positive for dysphoric mood. Negative for confusion. The patient is nervous/anxious.        Grief       Objective:   Physical Exam Constitutional:      General: She is not in acute distress.    Appearance: Normal appearance. She is well-developed. She is ill-appearing. She is not toxic-appearing or diaphoretic.     Comments: Frail appearing   HENT:     Head: Normocephalic and atraumatic.     Comments: Nares are injected and congested      Right Ear: Tympanic membrane, ear canal and external ear normal.     Left Ear: Tympanic membrane, ear canal and external ear normal.     Nose: Congestion and rhinorrhea present.     Mouth/Throat:     Mouth: Mucous membranes are moist. No oral lesions.     Pharynx: Oropharynx  is clear. No pharyngeal swelling, oropharyngeal exudate, posterior oropharyngeal erythema or uvula swelling.     Comments: Clear pnd  Posterior injection  No swelling or exudate Eyes:     General: No scleral icterus.       Right eye: No discharge.        Left eye: No discharge.     Conjunctiva/sclera: Conjunctivae normal.     Pupils: Pupils are equal, round, and reactive to light.  Neck:     Musculoskeletal: Normal range of motion and neck supple.  Cardiovascular:     Rate and Rhythm: Normal rate.     Pulses: Normal pulses.     Heart sounds: Normal heart sounds.  Pulmonary:     Effort: Pulmonary effort is normal. No respiratory distress.     Breath sounds: Normal breath sounds. No stridor. No wheezing, rhonchi or rales.  Chest:     Chest wall: No tenderness.  Abdominal:     General: Abdomen is flat. Bowel sounds are normal.  Musculoskeletal:     Right lower leg: No edema.     Left lower leg: No edema.  Lymphadenopathy:     Cervical: No cervical adenopathy.  Skin:    General: Skin is warm and dry.     Capillary Refill: Capillary refill takes less than 2  seconds.     Coloration: Skin is not jaundiced or pale.     Findings: No rash.  Neurological:     Mental Status: She is alert. Mental status is at baseline.     Cranial Nerves: No cranial nerve deficit.  Psychiatric:        Attention and Perception: Attention normal. She does not perceive auditory or visual hallucinations.        Mood and Affect: Mood is anxious and depressed.        Speech: Speech is tangential.        Thought Content: Thought content normal.     Comments: Tearful at times when discussing loss of husband and grief             Assessment & Plan:   Problem List Items Addressed This Visit      Cardiovascular and Mediastinum   Essential hypertension (Chronic)    bp in fair control at this time  BP Readings from Last 1 Encounters:  09/16/18 116/70   No changes needed Most recent labs reviewed    Disc lifstyle change with low sodium diet and exercise        Relevant Orders   Comprehensive metabolic panel (Completed)   CAD S/P percutaneous coronary angioplasty (Chronic)    Medically managed s/p stenting  Last cardiology visit this summer/rev  Her sob is worsening  Will ref back to check in         Respiratory   URI with cough and congestion - Primary    With ST/hoarseness and cough  Suspect viral  Reassuring exam  Adv delsym (with expectorant if needed) , inc fluid intake and rest  Also voice rest  Px tessalon  Watch closely for worse cough /fever or wheezing  Family is here and will keep a close eye on her         Other   Dyspnea on exertion (Chronic)    This is ongoing-and worrisome to family  Has been w/u by cardiology  PFTs reported as normal  Will check cbc today  Ref for check in visit with cardiology  Family wonders if her emotional state may add to this and fatigue (in setting of loosing her husband this week)      Anemia of chronic disease    Check cbc in light of inc fatigue and sob  Did have dec in Hb with shoulder surgery  Baseline runs 9-10 range  Not taking iron  On MTX for polymyalgia   Cbc and ferritin today      Relevant Orders   Ferritin (Completed)   CBC with Differential/Platelet (Completed)   PMR (polymyalgia rheumatica) (HCC)    Continues to see rheumatology Still taking methotrexate  No current steroids       Grief reaction    Lost husband this week Has xanax for prn cautious use Family (2 daughters) suspect more long term anx/dep that are stress related as well (hard to assess for this right now) Also they have some worries about her cognitive status  Declines addn med or grief counseling Has good support Will f/u in 2 wk to check in

## 2018-09-16 NOTE — Patient Instructions (Addendum)
Aim for 46 to 64 oz of fluids daily - more water and less tea and coke if possible   Labs for chemistries and blood count and ferritin   If you are worse (anemia wise) it will add to shortness of breath   When you are feeling up to it- I think that some cardio-pulmonary rehab would be very helpful    Delsym is great for cough  You can try the tessalon also  If you start getting chest congestion - you can change to mucinex DM (this will loosen congestion) Tylenol for sore throat   Call for a 2 weeks follow up please - let's talk about grief and see how you are doing

## 2018-09-17 DIAGNOSIS — F4320 Adjustment disorder, unspecified: Secondary | ICD-10-CM | POA: Insufficient documentation

## 2018-09-17 DIAGNOSIS — F4321 Adjustment disorder with depressed mood: Secondary | ICD-10-CM | POA: Insufficient documentation

## 2018-09-17 LAB — CBC WITH DIFFERENTIAL/PLATELET
Absolute Monocytes: 901 cells/uL (ref 200–950)
BASOS ABS: 59 {cells}/uL (ref 0–200)
Basophils Relative: 0.6 %
Eosinophils Absolute: 356 cells/uL (ref 15–500)
Eosinophils Relative: 3.6 %
HEMATOCRIT: 27.9 % — AB (ref 35.0–45.0)
HEMOGLOBIN: 9.3 g/dL — AB (ref 11.7–15.5)
Lymphs Abs: 1881 cells/uL (ref 850–3900)
MCH: 32.4 pg (ref 27.0–33.0)
MCHC: 33.3 g/dL (ref 32.0–36.0)
MCV: 97.2 fL (ref 80.0–100.0)
MPV: 10.8 fL (ref 7.5–12.5)
Monocytes Relative: 9.1 %
Neutro Abs: 6702 cells/uL (ref 1500–7800)
Neutrophils Relative %: 67.7 %
Platelets: 330 10*3/uL (ref 140–400)
RBC: 2.87 10*6/uL — ABNORMAL LOW (ref 3.80–5.10)
RDW: 15.7 % — ABNORMAL HIGH (ref 11.0–15.0)
Total Lymphocyte: 19 %
WBC: 9.9 10*3/uL (ref 3.8–10.8)

## 2018-09-17 LAB — COMPREHENSIVE METABOLIC PANEL
AG Ratio: 1.2 (calc) (ref 1.0–2.5)
ALT: 6 U/L (ref 6–29)
AST: 17 U/L (ref 10–35)
Albumin: 3.2 g/dL — ABNORMAL LOW (ref 3.6–5.1)
Alkaline phosphatase (APISO): 67 U/L (ref 33–130)
BUN/Creatinine Ratio: 25 (calc) — ABNORMAL HIGH (ref 6–22)
BUN: 25 mg/dL (ref 7–25)
CALCIUM: 8.5 mg/dL — AB (ref 8.6–10.4)
CHLORIDE: 104 mmol/L (ref 98–110)
CO2: 26 mmol/L (ref 20–32)
Creat: 1.01 mg/dL — ABNORMAL HIGH (ref 0.60–0.93)
GLOBULIN: 2.6 g/dL (ref 1.9–3.7)
Glucose, Bld: 93 mg/dL (ref 65–99)
Potassium: 3.8 mmol/L (ref 3.5–5.3)
Sodium: 140 mmol/L (ref 135–146)
Total Bilirubin: 0.4 mg/dL (ref 0.2–1.2)
Total Protein: 5.8 g/dL — ABNORMAL LOW (ref 6.1–8.1)

## 2018-09-17 LAB — FERRITIN: Ferritin: 182 ng/mL (ref 16–288)

## 2018-09-17 NOTE — Assessment & Plan Note (Signed)
Check cbc in light of inc fatigue and sob  Did have dec in Hb with shoulder surgery  Baseline runs 9-10 range  Not taking iron  On MTX for polymyalgia   Cbc and ferritin today

## 2018-09-17 NOTE — Assessment & Plan Note (Signed)
Lost husband this week Has xanax for prn cautious use Family (2 daughters) suspect more long term anx/dep that are stress related as well (hard to assess for this right now) Also they have some worries about her cognitive status  Declines addn med or grief counseling Has good support Will f/u in 2 wk to check in

## 2018-09-17 NOTE — Assessment & Plan Note (Signed)
Continues to see rheumatology Still taking methotrexate  No current steroids

## 2018-09-17 NOTE — Assessment & Plan Note (Signed)
With ST/hoarseness and cough  Suspect viral  Reassuring exam  Adv delsym (with expectorant if needed) , inc fluid intake and rest  Also voice rest  Px tessalon  Watch closely for worse cough /fever or wheezing  Family is here and will keep a close eye on her

## 2018-09-17 NOTE — Assessment & Plan Note (Signed)
This is ongoing-and worrisome to family  Has been w/u by cardiology  PFTs reported as normal  Will check cbc today  Ref for check in visit with cardiology  Family wonders if her emotional state may add to this and fatigue (in setting of loosing her husband this week)

## 2018-09-17 NOTE — Assessment & Plan Note (Signed)
bp in fair control at this time  BP Readings from Last 1 Encounters:  09/16/18 116/70   No changes needed Most recent labs reviewed  Disc lifstyle change with low sodium diet and exercise

## 2018-09-17 NOTE — Assessment & Plan Note (Signed)
Medically managed s/p stenting  Last cardiology visit this summer/rev  Her sob is worsening  Will ref back to check in

## 2018-10-04 ENCOUNTER — Ambulatory Visit: Payer: Medicare Other | Admitting: Family Medicine

## 2018-10-04 ENCOUNTER — Encounter: Payer: Self-pay | Admitting: Family Medicine

## 2018-10-04 VITALS — BP 110/60 | HR 77 | Temp 98.0°F

## 2018-10-04 DIAGNOSIS — R600 Localized edema: Secondary | ICD-10-CM

## 2018-10-04 DIAGNOSIS — F4321 Adjustment disorder with depressed mood: Secondary | ICD-10-CM

## 2018-10-04 DIAGNOSIS — J069 Acute upper respiratory infection, unspecified: Secondary | ICD-10-CM | POA: Diagnosis not present

## 2018-10-04 DIAGNOSIS — D638 Anemia in other chronic diseases classified elsewhere: Secondary | ICD-10-CM

## 2018-10-04 DIAGNOSIS — R0609 Other forms of dyspnea: Secondary | ICD-10-CM | POA: Diagnosis not present

## 2018-10-04 NOTE — Assessment & Plan Note (Signed)
Per pt a bit better now that she is more active  No change in exam  Again enc her to f/u with cardiology

## 2018-10-04 NOTE — Assessment & Plan Note (Signed)
Worse lately -but also eating more processed/salty foods and less active  Disc elevating legs Given DASH eating handout  Inc water intake She refuses to wear support stockings  Continue daily weights at home and update cardiology

## 2018-10-04 NOTE — Assessment & Plan Note (Signed)
Good support Doing as well as can be expected

## 2018-10-04 NOTE — Patient Instructions (Signed)
Your lungs sound good  Please alert me if cough worsens or becomes productive or if you run a fever  Drink your fluids  Blood pressure is good   For swelling -try to elevate feet when you sit  Walk more if you can  Look at the [facility] handout- limit sodium  If helpful- support socks to the knee may help   Labs were fairly stable

## 2018-10-04 NOTE — Assessment & Plan Note (Signed)
Back to baseline Hb of 9.3 Pt does not want to return to hematology unless this worsens or she gets more tired/sob

## 2018-10-04 NOTE — Assessment & Plan Note (Signed)
Overall much improved with some residual cough and hoarseness Reassuring exam  Recommend delsym otc  Fluids/voice rest and cough drops inst to call if she needs more cough med  Update if not starting to improve in a week or if worsening

## 2018-10-04 NOTE — Progress Notes (Signed)
Subjective:    Patient ID: [name] [name], female    DOB: 04/29/1941, 78 y.o.   MRN: [id number]  HPI Here for f/u of uri still hoarse and coughing   Having cough spells /coughs hard  Using ricola cough drops-they do help  Not coughing anything up   No fever   Sob is about the same  She sees cardiology and will f/u upcoming to go over the tests she has had   Has been moving a bit more /going to the shop every day   Feet and ankles have been swelling  She elevates her feet when able  Less active  Perhaps more processed foods  Does add fruit and vegetable   By scales at home had 179.2  Lab Results  Component Value Date   CREATININE 1.01 (H) 09/16/2018   BUN 25 09/16/2018   NA 140 09/16/2018   K 3.8 09/16/2018   CL 104 09/16/2018   CO2 26 09/16/2018   Is trying to drink enough water        Anemia of chronic dz Lab Results  Component Value Date   WBC 9.9 09/16/2018   HGB 9.3 (L) 09/16/2018   HCT 27.9 (L) 09/16/2018   MCV 97.2 09/16/2018   PLT 330 09/16/2018    Blood pressure  BP Readings from Last 3 Encounters:  10/04/18 110/60  09/16/18 116/70  08/22/18 126/72     Patient Active Problem List   Diagnosis Date Noted  . Pedal edema 10/04/2018  . Grief reaction 09/17/2018  . URI with cough and congestion 09/16/2018  . Yeast vaginitis 08/22/2018  . 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 Routine general medical examination at a health care facility 10/14/2017  . Preop cardiovascular exam 10/01/2017  . CAD S/P percutaneous coronary angioplasty 01/27/2017  . Aortic regurgitation 01/27/2017  . Dyspnea on exertion 08/19/2016  . Left bundle branch block (LBBB) on electrocardiogram 08/19/2016  . Steroid-induced hyperglycemia 10/10/2015  . Estrogen deficiency 10/08/2015  . Chronic fatigue 08/20/2015  . Deficiency anemia 11/17/2013  . Encounter for Medicare annual wellness exam 07/17/2013  . Muscle pain 07/17/2013  . PMR (polymyalgia rheumatica)  (HCC) 07/17/2013  . Intertrigo 04/19/2013  . Expected blood loss anemia 04/05/2013  . Obesity (BMI 30.0-34.9) 04/05/2013  . 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 IBS (irritable bowel syndrome) 05/30/2012  . Anemia of chronic disease 04/16/2010  . ANXIETY 04/16/2010  . GERD 03/04/2009  . Vitamin D deficiency 03/12/2008  . URINARY TRACT INFECTION, RECURRENT 08/07/2007  . 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 FIBROCYSTIC BREAST DISEASE, HX OF 02/11/2007  . Hyperlipidemia with low HDL 01/10/2007  . DEPRESSION 01/10/2007  . RETINAL VEIN OCCLUSION 01/10/2007  . Essential hypertension 01/10/2007  . PEPTIC ULCER DISEASE 01/10/2007  . 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 SKIN CANCER, HX OF 01/10/2007   Past Medical History:  Diagnosis Date  . Anemia    of chronic disease  . Anxiety   . Arthritis    "psoratic arthritis; new dx" (09/11/2016)  . CAD S/P percutaneous coronary angioplasty 08/2016   mLCx 90% -> 0% (2.75 mm x 16 mm) Synergy DES PCI Ostial D1 70% (not PTCA target). Moderate ostial and mD2.  LAD tandem 50% stenoses w/ negative FFR (0.86)  . Cancer ([place])    skin cancer on leg  . Chronic lower back pain   . Complication of anesthesia 1980 and 1982   trouble waking up after breast biopsy, many surgeries since no problem  . Depression   .  Dyspnea on effort - CHRONIC    partial relief of Sx post PCI of L Circumflex.  . Fibrocystic breast   . GERD (gastroesophageal reflux disease)   . Headache    "monthly" (09/11/2016)  . History of blood transfusion    "several since age 58; I've had them after most of my surgeries" (09/11/2016)  . History of myocardial infarction    "doctor said there are signs I've had a heart attack; don't know when"  - Myoview [name] 2018 indicated small Inferior - Inferoseptal Infarct (vs. rest ischemia). Echo with inferoseptal hypokinesis.  Marland Kitchen Hx of skin cancer, basal cell 2013 and 2012   right inner, lower leg; several scattered around my body  . Hypertension   .  Iron deficiency anemia    "I've had an iron infusion"  . Osteoarthritis   . Osteopenia   . Peptic ulcer disease    H pylori  . PMR (polymyalgia rheumatica) (HCC)   . PONV (postoperative nausea and vomiting)   . Psoriasis    Past Surgical History:  Procedure Laterality Date  . APPENDECTOMY    . BACK SURGERY     x 6   . BREAST BIOPSY Left 1980 and 1982   "benign"  . CARDIAC CATHETERIZATION N/A 09/11/2016   Procedure: Left Heart Cath and Coronary Angiography;  Surgeon: Leonie Man, MD;  Location: [facility];  Service: Cardiovascular: mLCx 90% * (PCI). Ostial D1 70% (not PTCA target). Mod ostial and mD2.  LAD tandem 50% stenoses w/ negative FFR (0.86)  . CARDIAC CATHETERIZATION N/A 09/11/2016   Procedure: Intravascular Pressure Wire/FFR Study;  Surgeon: Leonie Man, MD;  Location: [facility];  Service: Cardiovascular: --  LAD tandem 50% stenoses w/ negative FFR (0.86  . CARDIAC CATHETERIZATION N/A 09/11/2016   Procedure: Coronary Stent Intervention;  Surgeon: Leonie Man, MD;  Location: [facility];  Service: Cardiovascular: -- DES PCI mLCx 90%--0% SYNERGY DES 2.75 MM X 16 MM.  Marland Kitchen CARDIAC CATHETERIZATION  2001   non-obstructive CAD  . CATARACT EXTRACTION W/ INTRAOCULAR LENS  IMPLANT, BILATERAL  July -  August 2017  . DILATION AND CURETTAGE OF UTERUS  X 3  . ESOPHAGOGASTRODUODENOSCOPY  03/2009   ulcer,HP, GERD stricture  . EXCISIONAL HEMORRHOIDECTOMY    . HIP ARTHROPLASTY Left 2010  . JOINT REPLACEMENT    . KNEE ARTHROSCOPY Bilateral   . LAPAROSCOPIC CHOLECYSTECTOMY  2005  . LUMBAR DISC SURGERY  11/1984; 1987; 1989; 2004; 2005 X 2   deg. disk lumber spine   . MOHS SURGERY Right    "inside my lower leg"  . NASAL SINUS SURGERY  1970s  . NM MYOVIEW LTD  08/28/2016   INTERMEDIATE RISK (b/c reduced EF & ? small infarct) -- EF ~35-40%.  ? Prior Small Inferior-apical & septal infarct (w/o ischemia).  Difuse HK - worse in inferoapex.  Suggest prior infarct  with no ischemia. Ischemic area correlates with echo WMA  . REVERSE SHOULDER ARTHROPLASTY Left 12/17/2017   Procedure: LEFT REVERSE SHOULDER ARTHROPLASTY;  Surgeon: Netta Cedars, MD;  Location: [facility];  Service: Orthopedics;  Laterality: Left;  . TOTAL HIP ARTHROPLASTY Right 04/04/2013   Procedure: RIGHT TOTAL HIP ARTHROPLASTY ANTERIOR APPROACH;  Surgeon: Mauri Pole, MD;  Location: [facility];  Service: Orthopedics;  Laterality: Right;  . TOTAL KNEE ARTHROPLASTY Right 2009  . TRANSTHORACIC ECHOCARDIOGRAM  08/2016   mild conc LVH. EF 45-50% - anteroseptal & inferoseptal HK. Mod AI.  Marland Kitchen TRANSTHORACIC  ECHOCARDIOGRAM  08/2017   (post PCI) --> EF normalized:  Normal LV systolic function; mild diastolic dysfunction; mild   LVH; sclerotic aortic valve with mild AI.  No RWMA  . TUBAL LIGATION    . VAGINAL HYSTERECTOMY  1970s   partial    Social History   Tobacco Use  . Smoking status: Never Smoker  . Smokeless tobacco: Never Used  Substance Use Topics  . Alcohol use: Yes    Alcohol/week: 0.0 standard drinks    Comment: 09/11/2016 "might have a margarita q 3-4 months"  . Drug use: No   Family History  Problem Relation Age of Onset  . Arthritis Mother   . Emphysema Mother   . Hyperlipidemia Mother   . Hypertension Mother   . Heart disease Brother        CAD   Allergies  Allergen Reactions  . 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 Ace Inhibitors     cough  . Oxaprozin     GI upset   . Pregabalin Swelling    SWELLING REACTION UNSPECIFIED   . Amoxicillin-Pot Clavulanate Nausea And Vomiting    gi upset Has patient had a PCN reaction causing immediate rash, facial/tongue/throat swelling, SOB or lightheadedness with hypotension: No Has patient had a PCN reaction causing severe rash involving mucus membranes or skin necrosis: No Has patient had a PCN reaction that required hospitalization No Has patient had a PCN reaction occurring within the last  10 years: No If all of the above answers are "NO", then may proceed with Cephalosporin use.    Current Outpatient Medications on File Prior to Visit  Medication Sig Dispense Refill  . acetaminophen (TYLENOL) 500 MG tablet Take 1,000 mg by mouth 2 (two) times daily as needed for moderate pain or headache.     . ALPRAZolam (XANAX) 0.25 MG tablet Take 1-2 tablets (0.25-0.5 mg total) by mouth 2 (two) times daily as needed for anxiety. 40 tablet 0  . atorvastatin (LIPITOR) 20 MG tablet TAKE 1 TABLET BY MOUTH EVERY DAY 90 tablet 1  . benzonatate (TESSALON) 200 MG capsule Take 1 capsule (200 mg total) by mouth 3 (three) times daily as needed for cough. Do not bite pill 30 capsule 1  . [START ON 11/04/2018] clopidogrel (PLAVIX) 75 MG tablet Take 1 tablet (75 mg total) by mouth daily with breakfast. 90 tablet 3  . fluconazole (DIFLUCAN) 150 MG tablet Take one pill by mouth every 3 days 3 tablet 0  . folic acid (FOLVITE) 1 MG tablet Take 1 mg by mouth daily.    . hydrochlorothiazide (HYDRODIURIL) 12.5 MG tablet TAKE 1 TABLET BY MOUTH EVERY DAY WITH LOSARTAN TABLET 90 tablet 1  . HYDROcodone-acetaminophen (NORCO/VICODIN) 5-325 MG tablet Take 1-2 tablets by mouth every 4 (four) hours as needed for moderate pain or severe pain. 30 tablet 0  . isosorbide mononitrate (IMDUR) 60 MG 24 hr tablet TAKE 1 TABLET BY MOUTH EVERY DAY 90 tablet 3  . ketoconazole (NIZORAL) 2 % cream Apply 1 application topically daily. To external yeast rash area 30 g 1  . losartan (COZAAR) 50 MG tablet TAKE 1 TABLET BY MOUTH EVERY DAY WITH HCTZ 90 tablet 1  . Melatonin 3 MG CAPS Take 6 mg by mouth at bedtime.    . methotrexate (50 MG/ML) 1 g injection Inject 40 mg into the vein once a week.     . nystatin (MYCOSTATIN/NYSTOP) powder Apply topically 2 (two) times  daily as needed. To affected areas to prevent yeast (in skin folds) 30 g 1  . pantoprazole (PROTONIX) 40 MG tablet TAKE 1 TABLET BY MOUTH EVERY DAY 90 tablet 1  . raloxifene  (EVISTA) 60 MG tablet Take 1 tablet (60 mg total) by mouth daily. 90 tablet 3  . metoprolol tartrate (LOPRESSOR) 25 MG tablet Take 1 tablet (25 mg total) by mouth 2 (two) times daily. 180 tablet 3  . nitroGLYCERIN (NITROSTAT) 0.4 MG SL tablet Place 1 tablet (0.4 mg total) under the tongue every 5 (five) minutes as needed for chest pain. 25 tablet 5   No current facility-administered medications on file prior to visit.     Review of Systems  Constitutional: Positive for fatigue. Negative for activity change, appetite change, fever and unexpected weight change.  HENT: Positive for postnasal drip. Negative for congestion, ear pain, rhinorrhea, sinus pressure and sore throat.   Eyes: Negative for pain, redness and visual disturbance.  Respiratory: Positive for cough and shortness of breath. Negative for choking, wheezing and stridor.   Cardiovascular: Negative for chest pain and palpitations.  Gastrointestinal: Negative for abdominal pain, blood in stool, constipation and diarrhea.  Endocrine: Negative for polydipsia and polyuria.  Genitourinary: Negative for dysuria, frequency and urgency.  Musculoskeletal: Positive for arthralgias and back pain. Negative for myalgias.  Skin: Negative for pallor and rash.  Allergic/Immunologic: Negative for environmental allergies.  Neurological: Negative for dizziness, syncope, facial asymmetry, numbness and headaches.  Hematological: Negative for adenopathy. Does not bruise/bleed easily.  Psychiatric/Behavioral: Positive for dysphoric mood. Negative for decreased concentration. The patient is not nervous/anxious.        Grief        Objective:   Physical Exam Constitutional:      General: She is not in acute distress.    Appearance: Normal appearance. She is well-developed. She is obese. She is not ill-appearing.  HENT:     Head: Normocephalic and atraumatic.     Right Ear: Tympanic membrane and ear canal normal.     Left Ear: Tympanic membrane and  ear canal normal.     Mouth/Throat:     Mouth: Mucous membranes are moist.     Pharynx: Oropharynx is clear.  Eyes:     Conjunctiva/sclera: Conjunctivae normal.     Pupils: Pupils are equal, round, and reactive to light.  Neck:     Musculoskeletal: Normal range of motion and neck supple.     Thyroid: No thyromegaly.     Vascular: No carotid bruit or JVD.  Cardiovascular:     Rate and Rhythm: Normal rate and regular rhythm.     Pulses: Normal pulses.     Heart sounds: Murmur present. No gallop.   Pulmonary:     Effort: Pulmonary effort is normal. No respiratory distress.     Breath sounds: Normal breath sounds. No wheezing or rales.  Abdominal:     General: Bowel sounds are normal. There is no distension or abdominal bruit.     Palpations: Abdomen is soft. There is no mass.     Tenderness: There is no abdominal tenderness.  Musculoskeletal:     Right lower leg: Edema present.     Left lower leg: Edema present.     Comments: One plus pedal edema to ankle No tenderness or erythema   Lymphadenopathy:     Cervical: No cervical adenopathy.  Skin:    General: Skin is warm and dry.     Capillary Refill: Capillary refill takes less  than 2 seconds.     Findings: No rash.  Neurological:     Mental Status: She is alert. Mental status is at baseline.     Deep Tendon Reflexes: Reflexes are normal and symmetric.  Psychiatric:        Mood and Affect: Mood normal.     Comments: Mood is improved Talkative  Openly discusses grief            Assessment & Plan:   Problem List Items Addressed This Visit      Respiratory   URI with cough and congestion - Primary    Overall much improved with some residual cough and hoarseness Reassuring exam  Recommend delsym otc  Fluids/voice rest and cough drops inst to call if she needs more cough med  Update if not starting to improve in a week or if worsening          Other   Dyspnea on exertion (Chronic)    Per pt a bit better now that  she is more active  No change in exam  Again enc her to f/u with cardiology      Anemia of chronic disease    Back to baseline Hb of 9.3 Pt does not want to return to hematology unless this worsens or she gets more tired/sob      Grief reaction    Good support Doing as well as can be expected      Pedal edema    Worse lately -but also eating more processed/salty foods and less active  Disc elevating legs Given DASH eating handout  Inc water intake She refuses to wear support stockings  Continue daily weights at home and update cardiology

## 2018-10-16 ENCOUNTER — Telehealth: Payer: Self-pay | Admitting: Family Medicine

## 2018-10-16 DIAGNOSIS — E785 Hyperlipidemia, unspecified: Secondary | ICD-10-CM

## 2018-10-16 DIAGNOSIS — E786 Lipoprotein deficiency: Secondary | ICD-10-CM

## 2018-10-16 DIAGNOSIS — T380X5A Adverse effect of glucocorticoids and synthetic analogues, initial encounter: Secondary | ICD-10-CM

## 2018-10-16 DIAGNOSIS — D539 Nutritional anemia, unspecified: Secondary | ICD-10-CM

## 2018-10-16 DIAGNOSIS — I10 Essential (primary) hypertension: Secondary | ICD-10-CM

## 2018-10-16 DIAGNOSIS — E559 Vitamin D deficiency, unspecified: Secondary | ICD-10-CM

## 2018-10-16 DIAGNOSIS — R739 Hyperglycemia, unspecified: Secondary | ICD-10-CM

## 2018-10-16 NOTE — Telephone Encounter (Signed)
-----   Message from Eustace Pen, LPN sent at 01/15/9101  3:54 PM EST ----- Regarding: Labs 2/26 Lab orders needed. Thank you.

## 2018-10-19 ENCOUNTER — Ambulatory Visit: Payer: Medicare Other

## 2018-10-19 ENCOUNTER — Ambulatory Visit (INDEPENDENT_AMBULATORY_CARE_PROVIDER_SITE_OTHER): Payer: Medicare Other

## 2018-10-19 VITALS — BP 110/64 | HR 78 | Temp 98.0°F | Ht 63.5 in | Wt 172.3 lb

## 2018-10-19 DIAGNOSIS — Z0000 Encounter for general adult medical examination without abnormal findings: Secondary | ICD-10-CM

## 2018-10-19 DIAGNOSIS — D539 Nutritional anemia, unspecified: Secondary | ICD-10-CM | POA: Diagnosis not present

## 2018-10-19 DIAGNOSIS — E559 Vitamin D deficiency, unspecified: Secondary | ICD-10-CM

## 2018-10-19 DIAGNOSIS — T380X5A Adverse effect of glucocorticoids and synthetic analogues, initial encounter: Secondary | ICD-10-CM | POA: Diagnosis not present

## 2018-10-19 DIAGNOSIS — I10 Essential (primary) hypertension: Secondary | ICD-10-CM

## 2018-10-19 DIAGNOSIS — R739 Hyperglycemia, unspecified: Secondary | ICD-10-CM | POA: Diagnosis not present

## 2018-10-19 DIAGNOSIS — E786 Lipoprotein deficiency: Secondary | ICD-10-CM | POA: Diagnosis not present

## 2018-10-19 DIAGNOSIS — E785 Hyperlipidemia, unspecified: Secondary | ICD-10-CM

## 2018-10-19 LAB — CBC WITH DIFFERENTIAL/PLATELET
Basophils Absolute: 0 10*3/uL (ref 0.0–0.1)
Basophils Relative: 0.9 % (ref 0.0–3.0)
Eosinophils Absolute: 0.2 10*3/uL (ref 0.0–0.7)
Eosinophils Relative: 4.6 % (ref 0.0–5.0)
HCT: 29.4 % — ABNORMAL LOW (ref 36.0–46.0)
HEMOGLOBIN: 9.7 g/dL — AB (ref 12.0–15.0)
LYMPHS PCT: 33.1 % (ref 12.0–46.0)
Lymphs Abs: 1.6 10*3/uL (ref 0.7–4.0)
MCHC: 32.9 g/dL (ref 30.0–36.0)
MCV: 100.1 fl — ABNORMAL HIGH (ref 78.0–100.0)
MONOS PCT: 6.9 % (ref 3.0–12.0)
Monocytes Absolute: 0.3 10*3/uL (ref 0.1–1.0)
Neutro Abs: 2.6 10*3/uL (ref 1.4–7.7)
Neutrophils Relative %: 54.5 % (ref 43.0–77.0)
Platelets: 258 10*3/uL (ref 150.0–400.0)
RBC: 2.94 Mil/uL — ABNORMAL LOW (ref 3.87–5.11)
RDW: 15.7 % — ABNORMAL HIGH (ref 11.5–15.5)
WBC: 4.8 10*3/uL (ref 4.0–10.5)

## 2018-10-19 LAB — COMPREHENSIVE METABOLIC PANEL
ALT: 7 U/L (ref 0–35)
AST: 17 U/L (ref 0–37)
Albumin: 3.3 g/dL — ABNORMAL LOW (ref 3.5–5.2)
Alkaline Phosphatase: 63 U/L (ref 39–117)
BUN: 16 mg/dL (ref 6–23)
CALCIUM: 8.6 mg/dL (ref 8.4–10.5)
CO2: 22 mEq/L (ref 19–32)
Chloride: 110 mEq/L (ref 96–112)
Creatinine, Ser: 0.89 mg/dL (ref 0.40–1.20)
GFR: 61.16 mL/min (ref >60.00)
Glucose, Bld: 85 mg/dL (ref 70–99)
Potassium: 4.5 mEq/L (ref 3.5–5.1)
Sodium: 140 mEq/L (ref 135–145)
Total Bilirubin: 0.4 mg/dL (ref 0.2–1.2)
Total Protein: 6 g/dL (ref 6.0–8.3)

## 2018-10-19 LAB — LIPID PANEL
Cholesterol: 105 mg/dL (ref 0–200)
HDL: 36 mg/dL — ABNORMAL LOW (ref >39.00)
NonHDL: 69.27
Total CHOL/HDL Ratio: 3
Triglycerides: 227 mg/dL — ABNORMAL HIGH (ref 0.0–149.0)
VLDL: 45.4 mg/dL — AB (ref 0.0–40.0)

## 2018-10-19 LAB — LDL CHOLESTEROL, DIRECT: Direct LDL: 35 mg/dL

## 2018-10-19 LAB — HEMOGLOBIN A1C: Hgb A1c MFr Bld: 5.3 % (ref 4.6–6.5)

## 2018-10-19 NOTE — Patient Instructions (Addendum)
Ms. Divirgilio , Thank you for taking time to come for your Medicare Wellness Visit. I appreciate your ongoing commitment to your health goals. Please review the following plan we discussed and let me know if I can assist you in the future.   These are the goals we discussed: Goals    . Follow up with Primary Care Provider     Starting 10/19/2018, I will continue to take medications as prescribed and to keep appointments with PCP as scheduled.        This is a list of the screening recommended for you and due dates:  Health Maintenance  Topic Date Due  . Tetanus Vaccine  08/23/2020  . Flu Shot  11/02/2018*  . Mammogram  02/10/2019  . DEXA scan (bone density measurement)  Completed  . Pneumonia vaccines  Completed  *Topic was postponed. The date shown is not the original due date.   Preventive Care for Adults  A healthy lifestyle and preventive care can promote health and wellness. Preventive health guidelines for adults include the following key practices.  . A routine yearly physical is a good way to check with your health care provider about your health and preventive screening. It is a chance to share any concerns and updates on your health and to receive a thorough exam.  . Visit your dentist for a routine exam and preventive care every 6 months. Brush your teeth twice a day and floss once a day. Good oral hygiene prevents tooth decay and gum disease.  . The frequency of eye exams is based on your age, health, family medical history, use  of contact lenses, and other factors. Follow your health care provider's recommendations for frequency of eye exams.  . Eat a healthy diet. Foods like vegetables, fruits, whole grains, low-fat dairy products, and lean protein foods contain the nutrients you need without too many calories. Decrease your intake of foods high in solid fats, added sugars, and salt. Eat the right amount of calories for you. Get information about a proper diet from your  health care provider, if necessary.  . Regular physical exercise is one of the most important things you can do for your health. Most adults should get at least 150 minutes of moderate-intensity exercise (any activity that increases your heart rate and causes you to sweat) each week. In addition, most adults need muscle-strengthening exercises on 2 or more days a week.  Silver Sneakers may be a benefit available to you. To determine eligibility, you may visit the website: www.silversneakers.com or contact program at 325 587 7143 Mon-Fri between 8AM-8PM.   . Maintain a healthy weight. The body mass index (BMI) is a screening tool to identify possible weight problems. It provides an estimate of body fat based on height and weight. Your health care provider can find your BMI and can help you achieve or maintain a healthy weight.   For adults 20 years and older: ? A BMI below 18.5 is considered underweight. ? A BMI of 18.5 to 24.9 is normal. ? A BMI of 25 to 29.9 is considered overweight. ? A BMI of 30 and above is considered obese.   . Maintain normal blood lipids and cholesterol levels by exercising and minimizing your intake of saturated fat. Eat a balanced diet with plenty of fruit and vegetables. Blood tests for lipids and cholesterol should begin at age 52 and be repeated every 5 years. If your lipid or cholesterol levels are high, you are over 50, or you are  at high risk for heart disease, you may need your cholesterol levels checked more frequently. Ongoing high lipid and cholesterol levels should be treated with medicines if diet and exercise are not working.  . If you smoke, find out from your health care provider how to quit. If you do not use tobacco, please do not start.  . If you choose to drink alcohol, please do not consume more than 2 drinks per day. One drink is considered to be 12 ounces (355 mL) of beer, 5 ounces (148 mL) of wine, or 1.5 ounces (44 mL) of liquor.  . If you are  71-63 years old, ask your health care provider if you should take aspirin to prevent strokes.  . Use sunscreen. Apply sunscreen liberally and repeatedly throughout the day. You should seek shade when your shadow is shorter than you. Protect yourself by wearing long sleeves, pants, a wide-brimmed hat, and sunglasses year round, whenever you are outdoors.  . Once a month, do a whole body skin exam, using a mirror to look at the skin on your back. Tell your health care provider of new moles, moles that have irregular borders, moles that are larger than a pencil eraser, or moles that have changed in shape or color.

## 2018-10-20 LAB — TSH: TSH: 3.04 u[IU]/mL (ref 0.35–4.50)

## 2018-10-20 LAB — VITAMIN D 25 HYDROXY (VIT D DEFICIENCY, FRACTURES): VITD: 21.77 ng/mL — ABNORMAL LOW (ref 30.00–100.00)

## 2018-10-25 ENCOUNTER — Encounter: Payer: Self-pay | Admitting: Family Medicine

## 2018-10-25 ENCOUNTER — Ambulatory Visit (INDEPENDENT_AMBULATORY_CARE_PROVIDER_SITE_OTHER): Payer: Medicare Other | Admitting: Family Medicine

## 2018-10-25 VITALS — BP 106/60 | HR 84 | Temp 98.1°F | Ht 63.5 in | Wt 169.4 lb

## 2018-10-25 DIAGNOSIS — M8580 Other specified disorders of bone density and structure, unspecified site: Secondary | ICD-10-CM | POA: Diagnosis not present

## 2018-10-25 DIAGNOSIS — M353 Polymyalgia rheumatica: Secondary | ICD-10-CM

## 2018-10-25 DIAGNOSIS — T380X5A Adverse effect of glucocorticoids and synthetic analogues, initial encounter: Secondary | ICD-10-CM

## 2018-10-25 DIAGNOSIS — Z23 Encounter for immunization: Secondary | ICD-10-CM | POA: Diagnosis not present

## 2018-10-25 DIAGNOSIS — E786 Lipoprotein deficiency: Secondary | ICD-10-CM

## 2018-10-25 DIAGNOSIS — E663 Overweight: Secondary | ICD-10-CM

## 2018-10-25 DIAGNOSIS — Z0000 Encounter for general adult medical examination without abnormal findings: Secondary | ICD-10-CM | POA: Diagnosis not present

## 2018-10-25 DIAGNOSIS — E559 Vitamin D deficiency, unspecified: Secondary | ICD-10-CM

## 2018-10-25 DIAGNOSIS — E785 Hyperlipidemia, unspecified: Secondary | ICD-10-CM

## 2018-10-25 DIAGNOSIS — I10 Essential (primary) hypertension: Secondary | ICD-10-CM

## 2018-10-25 DIAGNOSIS — F4321 Adjustment disorder with depressed mood: Secondary | ICD-10-CM

## 2018-10-25 DIAGNOSIS — D638 Anemia in other chronic diseases classified elsewhere: Secondary | ICD-10-CM

## 2018-10-25 DIAGNOSIS — R739 Hyperglycemia, unspecified: Secondary | ICD-10-CM

## 2018-10-25 MED ORDER — RALOXIFENE HCL 60 MG PO TABS: 60.0000 mg | ORAL_TABLET | Freq: Every day | ORAL | 3 refills | Status: DC

## 2018-10-25 MED ORDER — ERGOCALCIFEROL 1.25 MG (50000 UT) PO CAPS: 50000.0000 [IU] | ORAL_CAPSULE | ORAL | 0 refills | Status: DC

## 2018-10-25 NOTE — Assessment & Plan Note (Signed)
Reviewed health habits including diet and exercise and skin cancer prevention Reviewed appropriate screening tests for age  Also reviewed health mt list, fam hx and immunization status , as well as social and family history   See HPI amw reviewed  Labs reviewed  Doing ok with grief after loss of husband  Enc good self care  Enc her to start back on vitamin D Enc her to let us know when she wants to return to hematology for chronic anemia

## 2018-10-25 NOTE — Assessment & Plan Note (Signed)
Doing fairly well after loss of husband this year  Keeping busy/working in shop  Lots of family support

## 2018-10-25 NOTE — Progress Notes (Signed)
Subjective:    Patient ID: [name] [name], female    DOB: 1941-03-10, 78 y.o.   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 -this is a distraction   Emotionally she thinks she is doing ok  Overall   Wt Readings from Last 3 Encounters:  10/25/18 169 lb 6 oz (76.8 kg)  10/19/18 172 lb 4.8 oz (78.2 kg)  09/16/18 175 lb 4 oz (79.5 kg)  appetite is fair -eating ok (smaller portions) Staying busy  29.53 kg/m   amw was 2/26-reviewed   Flu vaccine - wants to get that today   Mammogram 6/19  Self breast exam - no lumps or changes   dexa 6/19 -osteopenia Mixed trend forearm vs spine  On evista since 2006 Falls -none  Fractures -none  D level is low at 21- was treated with high dose 12 weeks  Supposed to take 4000 iu-has NOT been taking it   bp is stable today  No cp or palpitations or headaches or edema  No side effects to medicines  BP Readings from Last 3 Encounters:  10/25/18 106/60  10/19/18 110/64  10/04/18 110/60     Hx of elevated blood glucose/from steroids Lab Results  Component Value Date   HGBA1C 5.3 10/19/2018   Now on MTX for PMR Not working as well as the "shots"- she may go back to that (now on mtx pills but was on injection) Has rheumatology appt the end of march   Hyperlipidemia with hx of vasc dz Lab Results  Component Value Date   CHOL 105 10/19/2018   CHOL 151 10/12/2017   CHOL 128 10/27/2016   Lab Results  Component Value Date   HDL 36.00 (L) 10/19/2018   HDL 44.00 10/12/2017   HDL 37 (L) 10/27/2016   Lab Results  Component Value Date   LDLCALC 52 10/27/2016   LDLCALC 97 05/25/2012   LDLCALC 81 04/20/2011   Lab Results  Component Value Date   TRIG 227.0 (H) 10/19/2018   TRIG 205.0 (H) 10/12/2017   TRIG 197 (H)  10/27/2016   Lab Results  Component Value Date   CHOLHDL 3 10/19/2018   CHOLHDL 3 10/12/2017   CHOLHDL 3.5 10/27/2016   Lab Results  Component Value Date   LDLDIRECT 35.0 10/19/2018   LDLDIRECT 74.0 10/12/2017   LDLDIRECT 107.0 12/10/2015  atorvastatin and diet  LDL is improved HDL is usually low  Trig up a bit   No cardiac changes  Her sob on exertion has improved   Anemia of chronic dz  Lab Results  Component Value Date   WBC 4.8 10/19/2018   HGB 9.7 (L) 10/19/2018   HCT 29.4 (L) 10/19/2018   MCV 100.1 (H) 10/19/2018   PLT 258.0 10/19/2018   At baseline  Did not want to return to hematology unless symptomatic   Lab Results  Component Value Date   CREATININE 0.89 10/19/2018   BUN 16 10/19/2018   NA 140 10/19/2018   K 4.5 10/19/2018   CL 110 10/19/2018   CO2 22 10/19/2018   Lab Results  Component Value Date   ALT 7 10/19/2018   AST 17 10/19/2018   ALKPHOS 63 10/19/2018   BILITOT 0.4 10/19/2018   Lab Results  Component  Value Date   TSH 3.04 10/19/2018    Patient Active Problem List   Diagnosis Date Noted  . Pedal edema 10/04/2018  . Grief reaction 09/17/2018  . URI with cough and congestion 09/16/2018  . Yeast vaginitis 08/22/2018  . 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 Routine general medical examination at a health care facility 10/14/2017  . Preop cardiovascular exam 10/01/2017  . CAD S/P percutaneous coronary angioplasty 01/27/2017  . Aortic regurgitation 01/27/2017  . Dyspnea on exertion 08/19/2016  . Left bundle branch block (LBBB) on electrocardiogram 08/19/2016  . Steroid-induced hyperglycemia 10/10/2015  . Estrogen deficiency 10/08/2015  . Chronic fatigue 08/20/2015  . Deficiency anemia 11/17/2013  . Encounter for Medicare annual wellness exam 07/17/2013  . Muscle pain 07/17/2013  . PMR (polymyalgia rheumatica) (HCC) 07/17/2013  . Intertrigo 04/19/2013  . Expected blood loss anemia 04/05/2013  . Overweight (BMI 25.0-29.9)  04/05/2013  . 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 IBS (irritable bowel syndrome) 05/30/2012  . Anemia of chronic disease 04/16/2010  . ANXIETY 04/16/2010  . GERD 03/04/2009  . Vitamin D deficiency 03/12/2008  . URINARY TRACT INFECTION, RECURRENT 08/07/2007  . 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 FIBROCYSTIC BREAST DISEASE, HX OF 02/11/2007  . Hyperlipidemia with low HDL 01/10/2007  . DEPRESSION 01/10/2007  . RETINAL VEIN OCCLUSION 01/10/2007  . Essential hypertension 01/10/2007  . PEPTIC ULCER DISEASE 01/10/2007  . 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 SKIN CANCER, HX OF 01/10/2007   Past Medical History:  Diagnosis Date  . Anemia    of chronic disease  . Anxiety   . Arthritis    "psoratic arthritis; new dx" (09/11/2016)  . CAD S/P percutaneous coronary angioplasty 08/2016   mLCx 90% -> 0% (2.75 mm x 16 mm) Synergy DES PCI Ostial D1 70% (not PTCA target). Moderate ostial and mD2.  LAD tandem 50% stenoses w/ negative FFR (0.86)  . Cancer ([place])    skin cancer on leg  . Chronic lower back pain   . Complication of anesthesia 1980 and 1982   trouble waking up after breast biopsy, many surgeries since no problem  . Depression   . Dyspnea on effort - CHRONIC    partial relief of Sx post PCI of L Circumflex.  . Fibrocystic breast   . GERD (gastroesophageal reflux disease)   . Headache    "monthly" (09/11/2016)  . History of blood transfusion    "several since age 58; I've had them after most of my surgeries" (09/11/2016)  . History of myocardial infarction    "doctor said there are signs I've had a heart attack; don't know when"  - Myoview [name] 2018 indicated small Inferior - Inferoseptal Infarct (vs. rest ischemia). Echo with inferoseptal hypokinesis.  Marland Kitchen Hx of skin cancer, basal cell 2013 and 2012   right inner, lower leg; several scattered around my body  . Hypertension   . Iron deficiency anemia    "I've had an iron infusion"  . Osteoarthritis   . Osteopenia   . Peptic ulcer  disease    H pylori  . PMR (polymyalgia rheumatica) (HCC)   . PONV (postoperative nausea and vomiting)   . Psoriasis    Past Surgical History:  Procedure Laterality Date  . APPENDECTOMY    . BACK SURGERY     x 6   . BREAST BIOPSY Left 1980 and 1982   "benign"  . CARDIAC CATHETERIZATION N/A 09/11/2016   Procedure: Left Heart Cath and Coronary Angiography;  Surgeon: Leonie Man,  MD;  Location: [facility];  Service: Cardiovascular: mLCx 90% * (PCI). Ostial D1 70% (not PTCA target). Mod ostial and mD2.  LAD tandem 50% stenoses w/ negative FFR (0.86)  . CARDIAC CATHETERIZATION N/A 09/11/2016   Procedure: Intravascular Pressure Wire/FFR Study;  Surgeon: Leonie Man, MD;  Location: [facility];  Service: Cardiovascular: --  LAD tandem 50% stenoses w/ negative FFR (0.86  . CARDIAC CATHETERIZATION N/A 09/11/2016   Procedure: Coronary Stent Intervention;  Surgeon: Leonie Man, MD;  Location: [facility];  Service: Cardiovascular: -- DES PCI mLCx 90%--0% SYNERGY DES 2.75 MM X 16 MM.  Marland Kitchen CARDIAC CATHETERIZATION  2001   non-obstructive CAD  . CATARACT EXTRACTION W/ INTRAOCULAR LENS  IMPLANT, BILATERAL  July -  August 2017  . DILATION AND CURETTAGE OF UTERUS  X 3  . ESOPHAGOGASTRODUODENOSCOPY  03/2009   ulcer,HP, GERD stricture  . EXCISIONAL HEMORRHOIDECTOMY    . HIP ARTHROPLASTY Left 2010  . JOINT REPLACEMENT    . KNEE ARTHROSCOPY Bilateral   . LAPAROSCOPIC CHOLECYSTECTOMY  2005  . LUMBAR DISC SURGERY  11/1984; 1987; 1989; 2004; 2005 X 2   deg. disk lumber spine   . MOHS SURGERY Right    "inside my lower leg"  . NASAL SINUS SURGERY  1970s  . NM MYOVIEW LTD  08/28/2016   INTERMEDIATE RISK (b/c reduced EF & ? small infarct) -- EF ~35-40%.  ? Prior Small Inferior-apical & septal infarct (w/o ischemia).  Difuse HK - worse in inferoapex.  Suggest prior infarct with no ischemia. Ischemic area correlates with echo WMA  . REVERSE SHOULDER ARTHROPLASTY Left 12/17/2017    Procedure: LEFT REVERSE SHOULDER ARTHROPLASTY;  Surgeon: Netta Cedars, MD;  Location: [facility];  Service: Orthopedics;  Laterality: Left;  . TOTAL HIP ARTHROPLASTY Right 04/04/2013   Procedure: RIGHT TOTAL HIP ARTHROPLASTY ANTERIOR APPROACH;  Surgeon: Mauri Pole, MD;  Location: [facility];  Service: Orthopedics;  Laterality: Right;  . TOTAL KNEE ARTHROPLASTY Right 2009  . TRANSTHORACIC ECHOCARDIOGRAM  08/2016   mild conc LVH. EF 45-50% - anteroseptal & inferoseptal HK. Mod AI.  Marland Kitchen TRANSTHORACIC ECHOCARDIOGRAM  08/2017   (post PCI) --> EF normalized:  Normal LV systolic function; mild diastolic dysfunction; mild   LVH; sclerotic aortic valve with mild AI.  No RWMA  . TUBAL LIGATION    . VAGINAL HYSTERECTOMY  1970s   partial    Social History   Tobacco Use  . Smoking status: Never Smoker  . Smokeless tobacco: Never Used  Substance Use Topics  . Alcohol use: Yes    Alcohol/week: 0.0 standard drinks    Comment: 09/11/2016 "might have a margarita q 3-4 months"  . Drug use: No   Family History  Problem Relation Age of Onset  . Arthritis Mother   . Emphysema Mother   . Hyperlipidemia Mother   . Hypertension Mother   . Heart disease Brother        CAD   Allergies  Allergen Reactions  . 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 Ace Inhibitors     cough  . Oxaprozin     GI upset   . Pregabalin Swelling    SWELLING REACTION UNSPECIFIED   . Amoxicillin-Pot Clavulanate Nausea And Vomiting    gi upset Has patient had a PCN reaction causing immediate rash, facial/tongue/throat swelling, SOB or lightheadedness with hypotension: No Has patient had a PCN reaction causing severe rash involving  mucus membranes or skin necrosis: No Has patient had a PCN reaction that required hospitalization No Has patient had a PCN reaction occurring within the last 10 years: No If all of the above answers are "NO", then may proceed with Cephalosporin use.    Current  Outpatient Medications on File Prior to Visit  Medication Sig Dispense Refill  . acetaminophen (TYLENOL) 500 MG tablet Take 1,000 mg by mouth 2 (two) times daily as needed for moderate pain or headache.     Marland Kitchen atorvastatin (LIPITOR) 20 MG tablet TAKE 1 TABLET BY MOUTH EVERY DAY 90 tablet 1  . cholecalciferol (VITAMIN D3) 25 MCG (1000 UT) tablet Take 4,000 Units by mouth daily.    Derrill Memo ON 11/04/2018] clopidogrel (PLAVIX) 75 MG tablet Take 1 tablet (75 mg total) by mouth daily with breakfast. 90 tablet 3  . folic acid (FOLVITE) 1 MG tablet Take 1 mg by mouth daily.    . hydrochlorothiazide (HYDRODIURIL) 12.5 MG tablet TAKE 1 TABLET BY MOUTH EVERY DAY WITH LOSARTAN TABLET 90 tablet 1  . HYDROcodone-acetaminophen (NORCO/VICODIN) 5-325 MG tablet Take 1-2 tablets by mouth every 4 (four) hours as needed for moderate pain or severe pain. 30 tablet 0  . isosorbide mononitrate (IMDUR) 60 MG 24 hr tablet TAKE 1 TABLET BY MOUTH EVERY DAY 90 tablet 3  . ketoconazole (NIZORAL) 2 % cream Apply 1 application topically daily. To external yeast rash area 30 g 1  . losartan (COZAAR) 50 MG tablet TAKE 1 TABLET BY MOUTH EVERY DAY WITH HCTZ 90 tablet 1  . Melatonin 3 MG CAPS Take 6 mg by mouth at bedtime.    . methotrexate (RHEUMATREX) 2.5 MG tablet TAKE 8 TABLETS BY MOUTH ONCE WEEKLY ON THE SAME DAY    . nystatin (MYCOSTATIN/NYSTOP) powder Apply topically 2 (two) times daily as needed. To affected areas to prevent yeast (in skin folds) 30 g 1  . pantoprazole (PROTONIX) 40 MG tablet TAKE 1 TABLET BY MOUTH EVERY DAY 90 tablet 1  . metoprolol tartrate (LOPRESSOR) 25 MG tablet Take 1 tablet (25 mg total) by mouth 2 (two) times daily. 180 tablet 3  . nitroGLYCERIN (NITROSTAT) 0.4 MG SL tablet Place 1 tablet (0.4 mg total) under the tongue every 5 (five) minutes as needed for chest pain. 25 tablet 5   No current facility-administered medications on file prior to visit.     Review of Systems  Constitutional: Positive  for fatigue. Negative for activity change, appetite change, fever and unexpected weight change.  HENT: Negative for congestion, ear pain, rhinorrhea, sinus pressure and sore throat.   Eyes: Negative for pain, redness and visual disturbance.  Respiratory: Positive for shortness of breath. Negative for cough and wheezing.        Sob is improving  Cardiovascular: Negative for chest pain, palpitations and leg swelling.  Gastrointestinal: Negative for abdominal pain, blood in stool, constipation and diarrhea.  Endocrine: Negative for polydipsia and polyuria.  Genitourinary: Negative for dysuria, frequency and urgency.  Musculoskeletal: Positive for arthralgias and myalgias. Negative for back pain and joint swelling.  Skin: Negative for pallor and rash.  Allergic/Immunologic: Negative for environmental allergies.  Neurological: Negative for dizziness, syncope and headaches.  Hematological: Negative for adenopathy. Does not bruise/bleed easily.  Psychiatric/Behavioral: Negative for decreased concentration and dysphoric mood. The patient is not nervous/anxious.        Objective:   Physical Exam Constitutional:      General: She is not in acute distress.    Appearance:  Normal appearance. She is well-developed. She is obese. She is not ill-appearing.  HENT:     Head: Normocephalic and atraumatic.     Right Ear: Tympanic membrane, ear canal and external ear normal.     Left Ear: Tympanic membrane, ear canal and external ear normal.     Nose: Nose normal.     Mouth/Throat:     Mouth: Mucous membranes are moist.     Pharynx: Oropharynx is clear.  Eyes:     General: No scleral icterus.    Conjunctiva/sclera: Conjunctivae normal.     Pupils: Pupils are equal, round, and reactive to light.  Neck:     Musculoskeletal: Normal range of motion and neck supple.     Thyroid: No thyromegaly.     Vascular: No carotid bruit or JVD.  Cardiovascular:     Rate and Rhythm: Normal rate and regular rhythm.       Heart sounds: Murmur present. No gallop.   Pulmonary:     Effort: Pulmonary effort is normal. No respiratory distress.     Breath sounds: Normal breath sounds. No wheezing or rales.  Chest:     Chest wall: No tenderness.  Abdominal:     General: Bowel sounds are normal. There is no distension or abdominal bruit.     Palpations: Abdomen is soft. There is no mass.     Tenderness: There is no abdominal tenderness.  Musculoskeletal: Normal range of motion.        General: No tenderness.     Right lower leg: No edema.     Left lower leg: No edema.  Lymphadenopathy:     Cervical: No cervical adenopathy.  Skin:    General: Skin is warm and dry.     Coloration: Skin is not pale.     Findings: No erythema or rash.     Comments: Solar lentigines diffusely Some sks  Neurological:     Mental Status: She is alert. Mental status is at baseline.     Cranial Nerves: No cranial nerve deficit.     Motor: No abnormal muscle tone.     Coordination: Coordination normal.     Gait: Gait normal.     Deep Tendon Reflexes: Reflexes are normal and symmetric. Reflexes normal.  Psychiatric:        Mood and Affect: Mood normal.           Assessment & Plan:   Problem List Items Addressed This Visit      Cardiovascular and Mediastinum   Essential hypertension (Chronic)    bp in fair control at this time  BP Readings from Last 1 Encounters:  10/25/18 106/60   No changes needed Most recent labs reviewed  Disc lifstyle change with low sodium diet and exercise          Musculoskeletal and Integument   Steroid-induced osteopenia    Rev dexa 6/19  No longer on steroid medication  On evista since 2006 (will plan 5 y total tx) No falls or fractures Enc her strongly to resume vit D -level is low again          Other   Hyperlipidemia with low HDL (Chronic)    Disc goals for lipids and reasons to control them Rev last labs with pt Rev low sat fat diet in detail  On atorvastatin  LDL  improved HDL still low and trig up  Disc exercise as tolerated       Vitamin D deficiency  Level drifted back down as pt did not take it after last 12 wk course  Will repeat 12 weeks of ergocalciferol (weekly high dose) Also stressed imp of daily otc D3 at 4000 iu daily  Disc imp to bone and overall health       Anemia of chronic disease    Hb of 9.7 /close to baseline Her sob on exertion is improving  She does not wish to return to hematology at this time Disc s/s of worsening anemia to watch for       Overweight (BMI 25.0-29.9)    Discussed how this problem influences overall health and the risks it imposes  Reviewed plan for weight loss with lower calorie diet (via better food choices and also portion control or program like weight watchers) and exercise building up to or more than 30 minutes 5 days per week including some aerobic activity         PMR (polymyalgia rheumatica) (HCC)    Doing fairly well with methotrexate from rheumatology No longer on prednisone       Routine general medical examination at a health care facility - Primary    Reviewed health habits including diet and exercise and skin cancer prevention Reviewed appropriate screening tests for age  Also reviewed health mt list, fam hx and immunization status , as well as social and family history   See HPI amw reviewed  Labs reviewed  Doing ok with grief after loss of husband  Enc good self care  Enc her to start back on vitamin D Enc her to let us know when she wants to return to hematology for chronic anemia       Grief reaction    Doing fairly well after loss of husband this year  Keeping busy/working in shop  Lots of family support       RESOLVED: Steroid-induced hyperglycemia    No longer on prednisone  A1C remains low Lab Results  Component Value Date   HGBA1C 5.3 10/19/2018          Other Visit Diagnoses    Need for influenza vaccination       Relevant Orders   Flu Vaccine  QUAD 6+ mos PF IM (Fluarix Quad PF) (Completed)

## 2018-10-25 NOTE — Assessment & Plan Note (Signed)
Level drifted back down as pt did not take it after last 12 wk course  Will repeat 12 weeks of ergocalciferol (weekly high dose) Also stressed imp of daily otc D3 at 4000 iu daily  Disc imp to bone and overall health

## 2018-10-25 NOTE — Assessment & Plan Note (Signed)
Discussed how this problem influences overall health and the risks it imposes  Reviewed plan for weight loss with lower calorie diet (via better food choices and also portion control or program like weight watchers) and exercise building up to or more than 30 minutes 5 days per week including some aerobic activity    

## 2018-10-25 NOTE — Assessment & Plan Note (Signed)
No longer on prednisone  A1C remains low Lab Results  Component Value Date   HGBA1C 5.3 10/19/2018

## 2018-10-25 NOTE — Assessment & Plan Note (Signed)
Hb of 9.7 /close to baseline Her sob on exertion is improving  She does not wish to return to hematology at this time Disc s/s of worsening anemia to watch for

## 2018-10-25 NOTE — Assessment & Plan Note (Signed)
Disc goals for lipids and reasons to control them Rev last labs with pt Rev low sat fat diet in detail  On atorvastatin  LDL improved HDL still low and trig up  Disc exercise as tolerated

## 2018-10-25 NOTE — Assessment & Plan Note (Signed)
bp in fair control at this time  BP Readings from Last 1 Encounters:  10/25/18 106/60   No changes needed Most recent labs reviewed  Disc lifstyle change with low sodium diet and exercise

## 2018-10-25 NOTE — Assessment & Plan Note (Signed)
Rev dexa 6/19  No longer on steroid medication  On evista since 2006 (will plan 5 y total tx) No falls or fractures Enc her strongly to resume vit D -level is low again

## 2018-10-25 NOTE — Assessment & Plan Note (Signed)
Doing fairly well with methotrexate from rheumatology No longer on prednisone

## 2018-10-25 NOTE — Patient Instructions (Addendum)
Let's do the high dose vitamin D weekly for 12 weeks Get back on the 4000 iu daily - every single day! This is to prevent broken bones   If or when you want to go to hematology for anemia let us know  Hb is 9.7 today   Keep taking care of yourself !

## 2018-10-31 ENCOUNTER — Other Ambulatory Visit: Payer: Self-pay | Admitting: Cardiology

## 2018-11-25 ENCOUNTER — Other Ambulatory Visit: Payer: Self-pay | Admitting: Cardiology

## 2018-12-02 ENCOUNTER — Other Ambulatory Visit: Payer: Self-pay | Admitting: Cardiology

## 2018-12-06 NOTE — Telephone Encounter (Signed)
See note sent directly.  Glenetta Hew, MD

## 2018-12-08 NOTE — Telephone Encounter (Signed)
Rx printed out - can sign Monday when I come in for DOD. - just make sure that it gets put with Sharon's box.  Thnx.  [name] [name]

## 2018-12-16 ENCOUNTER — Other Ambulatory Visit: Payer: Self-pay | Admitting: Cardiology

## 2018-12-16 ENCOUNTER — Telehealth: Payer: Self-pay | Admitting: Cardiology

## 2018-12-16 MED ORDER — HYDROCHLOROTHIAZIDE 12.5 MG PO TABS: ORAL_TABLET | ORAL | 1 refills | Status: DC

## 2018-12-16 MED ORDER — LOSARTAN POTASSIUM 50 MG PO TABS: ORAL_TABLET | ORAL | 1 refills | Status: DC

## 2018-12-16 MED ORDER — LOSARTAN POTASSIUM-HCTZ 50-12.5 MG PO TABS: 1.0000 | ORAL_TABLET | Freq: Every day | ORAL | 3 refills | Status: DC

## 2018-12-16 NOTE — Telephone Encounter (Signed)
 *  STAT* If patient is at the pharmacy, call can be transferred to refill team.   1. Which medications need to be refilled? (please list name of each medication and dose if known) losartan (COZAAR) 50 MG tablet with fluid pill in it  2. Which pharmacy/location (including street and city if local pharmacy) is medication to be sent to? [facility]  [place]  3. Do they need a 30 day or 90 day supply? 90 days  Patient is completely out of medicaiton

## 2018-12-16 NOTE — Telephone Encounter (Signed)
Losartan and HCTZ refilled.

## 2018-12-16 NOTE — Telephone Encounter (Signed)
New Message    Abby says they're sending 2 separate prescriptions but the pt wants the combo   Please call

## 2018-12-16 NOTE — Telephone Encounter (Signed)
OKAY TO SWITCH INDIVIDUAL DOS BACK TO COMBINATION-- LOSARTAN -HCTZ 50/12.5 MG  DAILY #[id number]  X 3 PHARMACY IS AWARE

## 2018-12-19 NOTE — Telephone Encounter (Signed)
PRESCRIPTION ALREADY CALLED INTO PHARMACY. PATIENT WANTED COMBINATION MEDICATION AGAIN.

## 2018-12-20 NOTE — Telephone Encounter (Signed)
Agree  DH

## 2019-01-06 ENCOUNTER — Encounter: Payer: Self-pay | Admitting: Family Medicine

## 2019-01-06 ENCOUNTER — Ambulatory Visit (INDEPENDENT_AMBULATORY_CARE_PROVIDER_SITE_OTHER): Payer: Medicare Other | Admitting: Family Medicine

## 2019-01-06 DIAGNOSIS — F4321 Adjustment disorder with depressed mood: Secondary | ICD-10-CM

## 2019-01-06 DIAGNOSIS — F411 Generalized anxiety disorder: Secondary | ICD-10-CM | POA: Diagnosis not present

## 2019-01-06 MED ORDER — BUSPIRONE HCL 15 MG PO TABS: 7.5000 mg | ORAL_TABLET | Freq: Two times a day (BID) | ORAL | 11 refills | Status: DC
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
## 2019-01-06 NOTE — Patient Instructions (Signed)
Do not hesitate to make changes for safety regarding the situation with the grand child (ie : police or restraining order)  Take care of yourself Stay busy and active if it helps and get outdoors when you can  Try buspar 15 mg a half to a whole pill twice daily for depression and anxiety  If you feel worse (instead of better) or if you feel down/hopeless or suicidal please let us know  My goal is to make you feel more relaxed/be able to enjoy things and feel less angry and anxious   Please keep me posted If you change your mind about counseling let us know

## 2019-01-06 NOTE — Assessment & Plan Note (Signed)
Loss of husband and several other friends and family members Pt denies problems with grief - being "used to it"  I suspect it may be feeding into anxious mood/anger however

## 2019-01-06 NOTE — Assessment & Plan Note (Signed)
Worse with stress reaction-this often manifests as anger/resentment  Pre daughter-some rage with recent situation with a grand son  Disc need for restraining order or police-they declined  Spoke with pt and daughter Bethena Roys  Will try buspar 7.5 to 15 mg bid  Discussed expectations of this medication including time to effectiveness and mechanism of action, also poss of side effects (early and late)- including mental fuzziness, weight or appetite change, nausea and poss of worse dep or anxiety (even suicidal thoughts)  Pt voiced understanding and will stop med and update if this occurs   She has been on it w/o problems in the past Disc imp of self care  Also outdoor time  >25 minutes spent in face to face time with patient, >50% spent in counselling or coordination of care-including enc family to watch closely for symptoms of worsening depression like hopelessness or tearfulness or self harm  inst to update me with how she tolerates medicine Enc her to consider counseling-not open to that right now

## 2019-01-11 ENCOUNTER — Other Ambulatory Visit: Payer: Self-pay | Admitting: Family Medicine

## 2019-01-11 NOTE — Telephone Encounter (Signed)
She can stop the high dose now and keep taking 4000 iu daily over the counter

## 2019-01-11 NOTE — Telephone Encounter (Signed)
?   If pt is suppose to keep taking med. It was originally prescribed on 10/13/18, the filled for 12 more weeks on 10/25/18, please advise

## 2019-02-14 ENCOUNTER — Telehealth: Payer: Self-pay | Admitting: Cardiology

## 2019-02-15 ENCOUNTER — Encounter: Payer: Self-pay | Admitting: Family Medicine

## 2019-02-16 NOTE — Telephone Encounter (Signed)
smartphone/ my chart declined/ consent/ pre reg completed

## 2019-02-18 ENCOUNTER — Other Ambulatory Visit: Payer: Self-pay

## 2019-02-18 ENCOUNTER — Emergency Department (HOSPITAL_COMMUNITY): Payer: Medicare Other

## 2019-02-18 ENCOUNTER — Emergency Department (HOSPITAL_COMMUNITY)
Admission: EM | Admit: 2019-02-18 | Discharge: 2019-02-19 | Disposition: A | Discharge status: Home / Self Care | Payer: Medicare Other | Attending: Emergency Medicine | Admitting: Emergency Medicine

## 2019-02-18 ENCOUNTER — Encounter (HOSPITAL_COMMUNITY): Payer: Self-pay | Admitting: Emergency Medicine

## 2019-02-18 DIAGNOSIS — Z79899 Other long term (current) drug therapy: Secondary | ICD-10-CM | POA: Insufficient documentation

## 2019-02-18 DIAGNOSIS — I2510 Atherosclerotic heart disease of native coronary artery without angina pectoris: Secondary | ICD-10-CM | POA: Insufficient documentation

## 2019-02-18 DIAGNOSIS — M25512 Pain in left shoulder: Secondary | ICD-10-CM | POA: Diagnosis not present

## 2019-02-18 DIAGNOSIS — Z96642 Presence of left artificial hip joint: Secondary | ICD-10-CM | POA: Diagnosis not present

## 2019-02-18 DIAGNOSIS — Z96651 Presence of right artificial knee joint: Secondary | ICD-10-CM | POA: Diagnosis not present

## 2019-02-18 DIAGNOSIS — Z85828 Personal history of other malignant neoplasm of skin: Secondary | ICD-10-CM | POA: Insufficient documentation

## 2019-02-18 DIAGNOSIS — I10 Essential (primary) hypertension: Secondary | ICD-10-CM | POA: Diagnosis not present

## 2019-02-18 DIAGNOSIS — Z96612 Presence of left artificial shoulder joint: Secondary | ICD-10-CM | POA: Diagnosis not present

## 2019-02-18 DIAGNOSIS — Z96641 Presence of right artificial hip joint: Secondary | ICD-10-CM | POA: Diagnosis not present

## 2019-02-18 MED ORDER — CYCLOBENZAPRINE HCL 10 MG PO TABS
5.0000 mg | ORAL_TABLET | Freq: Once | ORAL | Status: AC
  Administered 2019-02-19: 5 mg via ORAL
  Filled 2019-02-18: qty 1

## 2019-02-18 MED ORDER — CYCLOBENZAPRINE HCL 5 MG PO TABS: 5.0000 mg | ORAL_TABLET | Freq: Three times a day (TID) | ORAL | 0 refills | Status: DC | PRN

## 2019-02-18 NOTE — ED Triage Notes (Signed)
Patient c/o intermittent left shoulder pain with movement x2 days. Reports appt with ortho PA scheduled for Tuesday. States tonight moved shoulder while closing blinds that caused severe pain. Hx surgery to left shoulder.

## 2019-02-19 NOTE — Discharge Instructions (Addendum)
You can wear your sling as needed for comfort. Be cautious when using Flexeril.  Follow-up with your orthopedist this week

## 2019-02-19 NOTE — ED Provider Notes (Signed)
[facility] DEPT Provider Note   CSN: [id number] Arrival date & time: 02/18/19  2111     History   Chief Complaint Chief Complaint  Patient presents with  . Shoulder Pain    HPI [name] [name] is a 78 y.o. female.     The history is provided by the patient.  Shoulder Pain Location:  Shoulder Shoulder location:  L shoulder Injury: no   Pain details:    Quality: stabbing.   Radiates to:  Does not radiate   Severity:  Moderate   Onset quality:  Sudden   Duration:  2 days   Timing:  Intermittent   Progression:  Worsening Dislocation: no   Relieved by:  Rest Worsened by:  Movement Associated symptoms: no fever and no muscle weakness    Patient history of CAD, anxiety presents with left shoulder pain. She has a history of left reverse shoulder arthroplasty She reports about 2 days ago she moved her left arm wrong and has intense pain in her shoulder.  The pain has been intermittent.  It worsens with movement.  No weakness in the arm.  No falls. No other acute complaints Past Medical History:  Diagnosis Date  . Anemia    of chronic disease  . Anxiety   . Arthritis    "psoratic arthritis; new dx" (09/11/2016)  . CAD S/P percutaneous coronary angioplasty 08/2016   mLCx 90% -> 0% (2.75 mm x 16 mm) Synergy DES PCI Ostial D1 70% (not PTCA target). Moderate ostial and mD2.  LAD tandem 50% stenoses w/ negative FFR (0.86)  . Cancer ([place])    skin cancer on leg  . Chronic lower back pain   . Complication of anesthesia 1980 and 1982   trouble waking up after breast biopsy, many surgeries since no problem  . Depression   . Dyspnea on effort - CHRONIC    partial relief of Sx post PCI of L Circumflex.  . Fibrocystic breast   . GERD (gastroesophageal reflux disease)   . Headache    "monthly" (09/11/2016)  . History of blood transfusion    "several since age 6; I've had them after most of my surgeries" (09/11/2016)  . History of myocardial  infarction    "doctor said there are signs I've had a heart attack; don't know when"  - Myoview [name] 2018 indicated small Inferior - Inferoseptal Infarct (vs. rest ischemia). Echo with inferoseptal hypokinesis.  Marland Kitchen Hx of skin cancer, basal cell 2013 and 2012   right inner, lower leg; several scattered around my body  . Hypertension   . Iron deficiency anemia    "I've had an iron infusion"  . Osteoarthritis   . Osteopenia   . Peptic ulcer disease    H pylori  . PMR (polymyalgia rheumatica) (HCC)   . PONV (postoperative nausea and vomiting)   . Psoriasis     Patient Active Problem List   Diagnosis Date Noted  . Pedal edema 10/04/2018  . Grief reaction 09/17/2018  . 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 Routine general medical examination at a health care facility 10/14/2017  . Preop cardiovascular exam 10/01/2017  . CAD S/P percutaneous coronary angioplasty 01/27/2017  . Aortic regurgitation 01/27/2017  . Dyspnea on exertion 08/19/2016  . Left bundle branch block (LBBB) on electrocardiogram 08/19/2016  . Estrogen deficiency 10/08/2015  . Chronic fatigue 08/20/2015  . Deficiency anemia 11/17/2013  . Encounter for Medicare annual wellness exam 07/17/2013  . PMR (polymyalgia rheumatica) (HCC)  07/17/2013  . Intertrigo 04/19/2013  . Expected blood loss anemia 04/05/2013  . Overweight (BMI 25.0-29.9) 04/05/2013  . 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 IBS (irritable bowel syndrome) 05/30/2012  . Anemia of chronic disease 04/16/2010  . Generalized anxiety disorder 04/16/2010  . GERD 03/04/2009  . Vitamin D deficiency 03/12/2008  . URINARY TRACT INFECTION, RECURRENT 08/07/2007  . 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 FIBROCYSTIC BREAST DISEASE, HX OF 02/11/2007  . Hyperlipidemia with low HDL 01/10/2007  . DEPRESSION 01/10/2007  . RETINAL VEIN OCCLUSION 01/10/2007  . Essential hypertension 01/10/2007  . PEPTIC ULCER DISEASE 01/10/2007  . 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 SKIN  CANCER, HX OF 01/10/2007    Past Surgical History:  Procedure Laterality Date  . APPENDECTOMY    . BACK SURGERY     x 6   . BREAST BIOPSY Left 1980 and 1982   "benign"  . CARDIAC CATHETERIZATION N/A 09/11/2016   Procedure: Left Heart Cath and Coronary Angiography;  Surgeon: Leonie Man, MD;  Location: [facility];  Service: Cardiovascular: mLCx 90% * (PCI). Ostial D1 70% (not PTCA target). Mod ostial and mD2.  LAD tandem 50% stenoses w/ negative FFR (0.86)  . CARDIAC CATHETERIZATION N/A 09/11/2016   Procedure: Intravascular Pressure Wire/FFR Study;  Surgeon: Leonie Man, MD;  Location: [facility];  Service: Cardiovascular: --  LAD tandem 50% stenoses w/ negative FFR (0.86  . CARDIAC CATHETERIZATION N/A 09/11/2016   Procedure: Coronary Stent Intervention;  Surgeon: Leonie Man, MD;  Location: [facility];  Service: Cardiovascular: -- DES PCI mLCx 90%--0% SYNERGY DES 2.75 MM X 16 MM.  Marland Kitchen CARDIAC CATHETERIZATION  2001   non-obstructive CAD  . CATARACT EXTRACTION W/ INTRAOCULAR LENS  IMPLANT, BILATERAL  July -  August 2017  . DILATION AND CURETTAGE OF UTERUS  X 3  . ESOPHAGOGASTRODUODENOSCOPY  03/2009   ulcer,HP, GERD stricture  . EXCISIONAL HEMORRHOIDECTOMY    . HIP ARTHROPLASTY Left 2010  . JOINT REPLACEMENT    . KNEE ARTHROSCOPY Bilateral   . LAPAROSCOPIC CHOLECYSTECTOMY  2005  . LUMBAR DISC SURGERY  11/1984; 1987; 1989; 2004; 2005 X 2   deg. disk lumber spine   . MOHS SURGERY Right    "inside my lower leg"  . NASAL SINUS SURGERY  1970s  . NM MYOVIEW LTD  08/28/2016   INTERMEDIATE RISK (b/c reduced EF & ? small infarct) -- EF ~35-40%.  ? Prior Small Inferior-apical & septal infarct (w/o ischemia).  Difuse HK - worse in inferoapex.  Suggest prior infarct with no ischemia. Ischemic area correlates with echo WMA  . REVERSE SHOULDER ARTHROPLASTY Left 12/17/2017   Procedure: LEFT REVERSE SHOULDER ARTHROPLASTY;  Surgeon: Netta Cedars, MD;  Location: [facility];   Service: Orthopedics;  Laterality: Left;  . TOTAL HIP ARTHROPLASTY Right 04/04/2013   Procedure: RIGHT TOTAL HIP ARTHROPLASTY ANTERIOR APPROACH;  Surgeon: Mauri Pole, MD;  Location: [facility];  Service: Orthopedics;  Laterality: Right;  . TOTAL KNEE ARTHROPLASTY Right 2009  . TRANSTHORACIC ECHOCARDIOGRAM  08/2016   mild conc LVH. EF 45-50% - anteroseptal & inferoseptal HK. Mod AI.  Marland Kitchen TRANSTHORACIC ECHOCARDIOGRAM  08/2017   (post PCI) --> EF normalized:  Normal LV systolic function; mild diastolic dysfunction; mild   LVH; sclerotic aortic valve with mild AI.  No RWMA  . TUBAL LIGATION    . VAGINAL HYSTERECTOMY  1970s   partial      OB History   No obstetric history on  file.      Home Medications    Prior to Admission medications   Medication Sig Start Date End Date Taking? Authorizing Provider  acetaminophen (TYLENOL) 500 MG tablet Take 1,000 mg by mouth 2 (two) times daily as needed for moderate pain or headache.     [provider]  atorvastatin (LIPITOR) 20 MG tablet TAKE 1 TABLET BY MOUTH EVERY DAY 09/16/18   Leonie Man, MD  busPIRone (BUSPAR) 15 MG tablet Take 0.5-1 tablets (7.5-15 mg total) by mouth 2 (two) times daily. 01/06/19   Tower, [name], MD  clopidogrel (PLAVIX) 75 MG tablet Take 1 tablet (75 mg total) by mouth daily with breakfast. 11/04/18   Leonie Man, MD  cyclobenzaprine (FLEXERIL) 5 MG tablet Take 1 tablet (5 mg total) by mouth 3 (three) times daily as needed for muscle spasms. 02/18/19   Ripley Fraise, MD  ergocalciferol (VITAMIN D2) 1.25 MG (50000 UT) capsule Take 1 capsule (50,000 Units total) by mouth once a week. 10/25/18   Tower, [name], MD  folic acid (FOLVITE) 1 MG tablet Take 2 mg by mouth daily.     [provider]  HYDROcodone-acetaminophen (NORCO/VICODIN) 5-325 MG tablet Take 1-2 tablets by mouth every 4 (four) hours as needed for moderate pain or severe pain. 12/17/17   Netta Cedars, MD  isosorbide mononitrate (IMDUR) 60 MG 24  hr tablet TAKE 1 TABLET BY MOUTH EVERY DAY 08/29/18   Leonie Man, MD  losartan-hydrochlorothiazide [facility]) 50-12.5 MG tablet Take 1 tablet by mouth daily. 12/16/18   Leonie Man, MD  Melatonin 3 MG CAPS Take 9 mg by mouth at bedtime.     [provider]  methotrexate (RHEUMATREX) 2.5 MG tablet Take 17.5 mg by mouth once a week. (7 tablets) 09/30/18   [provider]  metoprolol tartrate (LOPRESSOR) 25 MG tablet TAKE 1 TABLET BY MOUTH TWICE A DAY 11/25/18   Leonie Man, MD  nitroGLYCERIN (NITROSTAT) 0.4 MG SL tablet Place 1 tablet (0.4 mg total) under the tongue every 5 (five) minutes as needed for chest pain. 09/08/16 12/07/17  Lyda Jester M, PA-C  pantoprazole (PROTONIX) 40 MG tablet TAKE 1 TABLET BY MOUTH EVERY DAY 08/09/18   Leonie Man, MD  raloxifene (EVISTA) 60 MG tablet Take 1 tablet (60 mg total) by mouth daily. 10/25/18   Tower, [name], MD    Family History Family History  Problem Relation Age of Onset  . Arthritis Mother   . Emphysema Mother   . Hyperlipidemia Mother   . Hypertension Mother   . Heart disease Brother        CAD    Social History Social History   Tobacco Use  . Smoking status: Never Smoker  . Smokeless tobacco: Never Used  Substance Use Topics  . Alcohol use: Yes    Alcohol/week: 0.0 standard drinks    Comment: 09/11/2016 "might have a margarita q 3-4 months"  . Drug use: No     Allergies   Sulfonamide derivatives, Ace inhibitors, Oxaprozin, Pregabalin, and Amoxicillin-pot clavulanate   Review of Systems Review of Systems  Constitutional: Negative for fever.  Respiratory: Negative for cough.   Cardiovascular: Negative for chest pain.  Musculoskeletal: Positive for arthralgias.  All other systems reviewed and are negative.    Physical Exam Updated Vital Signs BP (!) 152/67 (BP Location: Right Arm)   Pulse 89   Temp 98.2 F (36.8 C) (Oral)   Resp 18   Ht 1.626  m (5\' 4" )   Wt 77.1 kg   SpO2 98%   BMI  29.18 kg/m   Physical Exam CONSTITUTIONAL: elderly, no distress HEAD: Normocephalic/atraumatic EYES: EOMI ENMT: Mucous membranes moist NECK: supple no meningeal signs SPINE/BACK:entire spine nontender CV: S1/S2 noted, no murmurs/rubs/gallops noted LUNGS: Lungs are clear to auscultation bilaterally, no apparent distress ABDOMEN: soft  NEURO: Pt is awake/alert/appropriate, moves all extremitiesx4.  No facial droop.   EXTREMITIES: pulses normal/equal, full ROM No erythema/bruising to shoulder No tenderness to left shoulder Mild tenderness to left scapula, no bruising/erythema She is able to range left shoulder, and able to raise left hand above her head SKIN: warm, color normal PSYCH: no abnormalities of mood noted, alert and oriented to situation  ED Treatments / Results  Labs (all labs ordered are listed, but only abnormal results are displayed) Labs Reviewed - No data to display  EKG    Radiology Dg Shoulder Left  Result Date: 02/18/2019 CLINICAL DATA:  Intermittent left shoulder pain EXAM: LEFT SHOULDER - 2+ VIEW COMPARISON:  December 17, 2017 FINDINGS: The patient is status post reversed total shoulder arthroplasty on the left. Alignment appears near anatomic. There is no periprosthetic fracture. No dislocation. Aortic calcifications are noted. IMPRESSION: No acute osseous abnormality. Electronically Signed   By: Constance Holster M.D.   On: 02/18/2019 22:49    Procedures Procedures Medications Ordered in ED Medications  cyclobenzaprine (FLEXERIL) tablet 5 mg (has no administration in time range)     Initial Impression / Assessment and Plan / ED Course  I have reviewed the triage vital signs and the nursing notes.  Pertinent  imaging results that were available during my care of the patient were reviewed by me and considered in my medical decision making (see chart for details).        No Acute findings on x-ray. Suspicion for for muscle spasm. No signs of acute  dislocation She can use sling, flexeril as needed, f/u with ortho Discussed caution using flexeril/vicodin D/w daughter via phone  Final Clinical Impressions(s) / ED Diagnoses   Final diagnoses:  Acute pain of left shoulder    ED Discharge Orders         Ordered    cyclobenzaprine (FLEXERIL) 5 MG tablet  3 times daily PRN     02/18/19 2355           Ripley Fraise, MD 02/19/19 0013

## 2019-02-20 NOTE — Telephone Encounter (Signed)
I called pt to confirm 02-21-19 appt with Dr Ellyn Hack.

## 2019-02-21 ENCOUNTER — Telehealth (INDEPENDENT_AMBULATORY_CARE_PROVIDER_SITE_OTHER): Payer: Medicare Other | Admitting: Cardiology

## 2019-02-21 ENCOUNTER — Encounter: Payer: Self-pay | Admitting: Cardiology

## 2019-02-21 ENCOUNTER — Telehealth: Payer: Self-pay | Admitting: *Deleted

## 2019-02-21 VITALS — Ht 64.0 in | Wt 170.0 lb

## 2019-02-21 DIAGNOSIS — Z9861 Coronary angioplasty status: Secondary | ICD-10-CM

## 2019-02-21 DIAGNOSIS — I2510 Atherosclerotic heart disease of native coronary artery without angina pectoris: Secondary | ICD-10-CM

## 2019-02-21 DIAGNOSIS — R0609 Other forms of dyspnea: Secondary | ICD-10-CM

## 2019-02-21 DIAGNOSIS — I119 Hypertensive heart disease without heart failure: Secondary | ICD-10-CM | POA: Diagnosis not present

## 2019-02-21 DIAGNOSIS — E786 Lipoprotein deficiency: Secondary | ICD-10-CM

## 2019-02-21 DIAGNOSIS — I447 Left bundle-branch block, unspecified: Secondary | ICD-10-CM

## 2019-02-21 DIAGNOSIS — I10 Essential (primary) hypertension: Secondary | ICD-10-CM

## 2019-02-21 DIAGNOSIS — E785 Hyperlipidemia, unspecified: Secondary | ICD-10-CM

## 2019-02-21 DIAGNOSIS — F4321 Adjustment disorder with depressed mood: Secondary | ICD-10-CM

## 2019-02-21 NOTE — Assessment & Plan Note (Signed)
Exertional dyspnea seems to have improved.  GXT would suggest that is probably more related to deconditioning and lack of mobility then cardiac.  She never really got a lot of improvement in her symptoms after PCI.  This was the one lesion that was physiologically significant the other was for not.  This would suggest a noncardiac etiology for her dyspnea.  Plan:   Continue Imdur along with low-dose beta-blocker and ARB.  On Plavix now without aspirin --> okay to hold Plavix 5 to 7 days preop for surgeries.  On statin.

## 2019-02-21 NOTE — Assessment & Plan Note (Signed)
Seems better now.  Sounds euvolemic with no significant PND or orthopnea.  CPX suggests deconditioning.  Encouraged her to continue to stay active and exercise.

## 2019-02-21 NOTE — Assessment & Plan Note (Signed)
Blood pressures been pretty well controlled based on recent PCP note.  Plan: Continue current dose of Hyzaar plus Lopressor.

## 2019-02-21 NOTE — Progress Notes (Signed)
Virtual Visit via Telephone Note   This visit type was conducted due to national recommendations for restrictions regarding the COVID-19 Pandemic (e.g. social distancing) in an effort to limit this patient's exposure and mitigate transmission in our community.  Due to her co-morbid illnesses, this patient is at least at moderate risk for complications without adequate follow up.  This format is felt to be most appropriate for this patient at this time.  The patient did not have access to video technology/had technical difficulties with video requiring transitioning to audio format only (telephone).  All issues noted in this document were discussed and addressed.  No physical exam could be performed with this format.  Please refer to the patient's chart for her  consent to telehealth for [facility].   Patient has given verbal permission to conduct this visit via virtual appointment and to bill insurance 02/21/2019 11:35 AM     Evaluation Performed:  Follow-up visit  Date:  02/21/2019   ID:  [name] [name], DOB April 10, 1941, MRN [id number]  Patient Location: Home Provider Location: Home  PCP:  Abner Greenspan, MD  Cardiologist:  Glenetta Hew, MD  Electrophysiologist:  None   Chief Complaint: 31-month follow-up-CAD  History of Present Illness:    [name] [name] is a 78 y.o. female with PMH notable for CAD having PCI who presents via audio/video conferencing for a telehealth visit today.  --She was initially evaluated in 2016/10/13 for dyspnea:   2 D Echo revealed areduced EF of 45 to 50% with septal and inferoseptal hypokinesis and moderate AI.    Stress test was intermediate risk with a EF of 30 to 44% w/ small inferior apical and septal infarct without ischemia.    Cardiac Cath 10-13-2016) for persistent Sx --> significant lesion in the L Circumflex -- treated with a DES stent.  Also noted moderate LAD disease that was FFR negative along with a small Diag w/ ostial 70% stenosis (not  good PCI target).   [name] [name] was last seen on August 10, 2018 -> she complained of exertional dyspnea.  Short of breath doing grocery shopping or exertional activity. --> CPX ordered.  Unfortunately she was limited by deconditioning.  ER visit on June 27 for acute left shoulder pain  Interval History:  Still notes SOB - not as bad as in 10-13-22. NO CP.  Husband died 10-10-22 (unexpectedly) -- had just had PPM check 1 wk before. -- She says she is now just trying to survive each day.  Seems down / sad & possibly depressed.  Not really doing any activity since then.  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 -- limits her some. Taking more Folate.   Cardiovascular ROS: positive for - dyspnea on exertion and this is better than before; is able to do more / longer negative for - chest pain, edema, irregular heartbeat, orthopnea, palpitations, paroxysmal nocturnal dyspnea, rapid heart rate or shortness of breath; no syncope/ near syncope, TIA/ amaurosis fugax, claudication. - just not able to do a lot of exercise, but is not "doing nothing".  The patient does not have symptoms concerning for COVID-19 infection (fever, chills, cough, or new shortness of breath).  The patient is practicing social distancing.  Family helps her out - both daughters are within 2 miles.  ROS:  Please see the history  of present illness.    Review of Systems  Constitutional: Negative for malaise/fatigue (not really lack of energy - lack of mobility).  HENT: Negative for nosebleeds.   Respiratory: Positive for shortness of breath (stable).   Gastrointestinal: Negative for blood in stool, constipation and melena.  Genitourinary: Negative for hematuria.  Musculoskeletal: Positive for joint pain and myalgias. Negative for falls.  Neurological: Negative for dizziness, focal weakness and weakness.  Psychiatric/Behavioral:  Negative for memory loss. The patient is not nervous/anxious and does not have insomnia.        Still grieving, but not depressed  All other systems reviewed and are negative.   Past Medical History:  Diagnosis Date  . Anemia    of chronic disease  . Anxiety   . Arthritis    "psoratic arthritis; new dx" (09/11/2016)  . CAD S/P percutaneous coronary angioplasty 08/2016   mLCx 90% -> 0% (2.75 mm x 16 mm) Synergy DES PCI Ostial D1 70% (not PTCA target). Moderate ostial and mD2.  LAD tandem 50% stenoses w/ negative FFR (0.86)  . Cancer ([place])    skin cancer on leg  . Chronic lower back pain   . Complication of anesthesia 1980 and 1982   trouble waking up after breast biopsy, many surgeries since no problem  . Depression   . Dyspnea on effort - CHRONIC    partial relief of Sx post PCI of L Circumflex.  . Fibrocystic breast   . GERD (gastroesophageal reflux disease)   . Headache    "monthly" (09/11/2016)  . History of blood transfusion    "several since age 38; I've had them after most of my surgeries" (09/11/2016)  . History of myocardial infarction    "doctor said there are signs I've had a heart attack; don't know when"  - Myoview [name] 2018 indicated small Inferior - Inferoseptal Infarct (vs. rest ischemia). Echo with inferoseptal hypokinesis.  Marland Kitchen Hx of skin cancer, basal cell 2013 and 2012   right inner, lower leg; several scattered around my body  . Hypertension   . Iron deficiency anemia    "I've had an iron infusion"  . Osteoarthritis   . Osteopenia   . Peptic ulcer disease    H pylori  . PMR (polymyalgia rheumatica) (HCC)   . PONV (postoperative nausea and vomiting)   . Psoriasis    Past Surgical History:  Procedure Laterality Date  . APPENDECTOMY    . BACK SURGERY     x 6   . BREAST BIOPSY Left 1980 and 1982   "benign"  . CARDIAC CATHETERIZATION N/A 09/11/2016   Procedure: Left Heart Cath and Coronary Angiography;  Surgeon: Leonie Man, MD;  Location: [facility];  Service: Cardiovascular: mLCx 90% * (PCI). Ostial D1 70% (not PTCA target). Mod ostial and mD2.  LAD tandem 50% stenoses w/ negative FFR (0.86)  . CARDIAC CATHETERIZATION N/A 09/11/2016   Procedure: Intravascular Pressure Wire/FFR Study;  Surgeon: Leonie Man, MD;  Location: [facility];  Service: Cardiovascular: --  LAD tandem 50% stenoses w/ negative FFR (0.86  . CARDIAC CATHETERIZATION N/A 09/11/2016   Procedure: Coronary Stent Intervention;  Surgeon: Leonie Man, MD;  Location: [facility];  Service: Cardiovascular: -- DES PCI mLCx 90%--0% SYNERGY DES 2.75 MM X 16 MM.  Marland Kitchen CARDIAC CATHETERIZATION  2001   non-obstructive CAD  . CATARACT EXTRACTION W/ INTRAOCULAR LENS  IMPLANT, BILATERAL  July -  August 2017  . DILATION AND  CURETTAGE OF UTERUS  X 3  . ESOPHAGOGASTRODUODENOSCOPY  03/2009   ulcer,HP, GERD stricture  . EXCISIONAL HEMORRHOIDECTOMY    . HIP ARTHROPLASTY Left 2010  . JOINT REPLACEMENT    . KNEE ARTHROSCOPY Bilateral   . LAPAROSCOPIC CHOLECYSTECTOMY  2005  . LUMBAR DISC SURGERY  11/1984; 1987; 1989; 2004; 2005 X 2   deg. disk lumber spine   . MOHS SURGERY Right    "inside my lower leg"  . NASAL SINUS SURGERY  1970s  . NM MYOVIEW LTD  08/28/2016   INTERMEDIATE RISK (b/c reduced EF & ? small infarct) -- EF ~35-40%.  ? Prior Small Inferior-apical & septal infarct (w/o ischemia).  Difuse HK - worse in inferoapex.  Suggest prior infarct with no ischemia. Ischemic area correlates with echo WMA  . REVERSE SHOULDER ARTHROPLASTY Left 12/17/2017   Procedure: LEFT REVERSE SHOULDER ARTHROPLASTY;  Surgeon: Netta Cedars, MD;  Location: [facility];  Service: Orthopedics;  Laterality: Left;  . TOTAL HIP ARTHROPLASTY Right 04/04/2013   Procedure: RIGHT TOTAL HIP ARTHROPLASTY ANTERIOR APPROACH;  Surgeon: Mauri Pole, MD;  Location: [facility];  Service: Orthopedics;  Laterality: Right;  . TOTAL KNEE ARTHROPLASTY Right 2009  . TRANSTHORACIC ECHOCARDIOGRAM  08/2016   mild conc  LVH. EF 45-50% - anteroseptal & inferoseptal HK. Mod AI.  Marland Kitchen TRANSTHORACIC ECHOCARDIOGRAM  08/2017   (post PCI) --> EF normalized:  Normal LV systolic function; mild diastolic dysfunction; mild   LVH; sclerotic aortic valve with mild AI.  No RWMA  . TUBAL LIGATION    . VAGINAL HYSTERECTOMY  1970s   partial     09/11/2016: Post-Intervention Diagram: Mid Cx lesion, 90 %stenosed - SYNERGY DES 2.75X16;  LAD FFR Negative       Current Meds  Medication Sig  . acetaminophen (TYLENOL) 500 MG tablet Take 1,000 mg by mouth 2 (two) times daily as needed for moderate pain or headache.   Marland Kitchen atorvastatin (LIPITOR) 20 MG tablet TAKE 1 TABLET BY MOUTH EVERY DAY  . busPIRone (BUSPAR) 15 MG tablet Take 0.5-1 tablets (7.5-15 mg total) by mouth 2 (two) times daily.  . clopidogrel (PLAVIX) 75 MG tablet Take 1 tablet (75 mg total) by mouth daily with breakfast.  . cyclobenzaprine (FLEXERIL) 5 MG tablet Take 1 tablet (5 mg total) by mouth 3 (three) times daily as needed for muscle spasms.  . ergocalciferol (VITAMIN D2) 1.25 MG (50000 UT) capsule Take 1 capsule (50,000 Units total) by mouth once a week.  . folic acid (FOLVITE) 1 MG tablet Take 2 mg by mouth daily.   Marland Kitchen HYDROcodone-acetaminophen (NORCO/VICODIN) 5-325 MG tablet Take 1-2 tablets by mouth every 4 (four) hours as needed for moderate pain or severe pain.  . isosorbide mononitrate (IMDUR) 60 MG 24 hr tablet TAKE 1 TABLET BY MOUTH EVERY DAY  . losartan-hydrochlorothiazide (HYZAAR) 50-12.5 MG tablet Take 1 tablet by mouth daily.  . Melatonin 3 MG CAPS Take 9 mg by mouth at bedtime.   . methotrexate (RHEUMATREX) 2.5 MG tablet Take 17.5 mg by mouth once a week. (7 tablets)  . metoprolol tartrate (LOPRESSOR) 25 MG tablet TAKE 1 TABLET BY MOUTH TWICE A DAY  . nitroGLYCERIN (NITROSTAT) 0.4 MG SL tablet Place 1 tablet (0.4 mg total) under the tongue every 5 (five) minutes as needed for chest pain.  . pantoprazole (PROTONIX) 40 MG tablet TAKE 1 TABLET BY MOUTH EVERY  DAY  . raloxifene (EVISTA) 60 MG tablet Take 1 tablet (60 mg total) by mouth daily.  Allergies:   Sulfonamide derivatives, Ace inhibitors, Oxaprozin, Pregabalin, and Amoxicillin-pot clavulanate   Social History   Tobacco Use  . Smoking status: Never Smoker  . Smokeless tobacco: Never Used  Substance Use Topics  . Alcohol use: Yes    Alcohol/week: 0.0 standard drinks    Comment: 09/11/2016 "might have a margarita q 3-4 months"  . Drug use: No     Family Hx: The patient's family history includes Arthritis in her mother; Emphysema in her mother; Heart disease in her brother; Hyperlipidemia in her mother; Hypertension in her mother.   Prior CV studies:   The following studies were reviewed today: . CPX January 2020: --Deconditioning/debilitated.  Unable exercise monitor again clinical data.  Recommend PT/OT referral.:  Labs/Other Tests and Data Reviewed:    EKG:  No ECG reviewed.  Recent Labs: 10/19/2018: ALT 7; BUN 16; Creatinine, Ser 0.89; Hemoglobin 9.7; Platelets 258.0; Potassium 4.5; Sodium 140; TSH 3.04   Recent Lipid Panel Lab Results  Component Value Date/Time   CHOL 105 10/19/2018 10:03 AM   TRIG 227.0 (H) 10/19/2018 10:03 AM   HDL 36.00 (L) 10/19/2018 10:03 AM   CHOLHDL 3 10/19/2018 10:03 AM   LDLCALC 52 10/27/2016 10:15 AM   LDLDIRECT 35.0 10/19/2018 10:03 AM    Wt Readings from Last 3 Encounters:  02/21/19 170 lb (77.1 kg)  02/18/19 170 lb (77.1 kg)  10/25/18 169 lb 6 oz (76.8 kg)     Objective:    Vital Signs:  Ht 5\' 4"  (1.626 m)   Wt 170 lb (77.1 kg)   BMI 29.18 kg/m   VITAL SIGNS:  reviewed GEN:  no acute distress RESPIRATORY:  non-labored NEURO:  A&O x 3. answers ?s appropriately PSYCH:  normal affect   ASSESSMENT & PLAN:    Problem List Items Addressed This Visit    Hyperlipidemia with low HDL (Chronic)    Lipids look great as of February. Seems to be doing okay on moderate-dose Lipitor.      Grief reaction    She seems to be  handling her husband's death and other social issues relatively well.  I think now having her great grandson moving seems to also be a difficult adjustment for her. 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  I think this is also to keep her more active.  Encouraged her to continue to stay active and continues a case with the remaining numbers of her family.  Good portion of our visit was talking about her social situation and how she is managing.  Allowing her to discuss the situation and be supportive listener.      Essential hypertension (Chronic)    Blood pressures been pretty well controlled based on recent PCP note.  Plan: Continue current dose of Hyzaar plus Lopressor.      Dyspnea on exertion (Chronic)    Seems better now.  Sounds euvolemic with no significant PND or orthopnea.  CPX suggests deconditioning.  Encouraged her to continue to stay active and exercise.        CAD S/P percutaneous coronary angioplasty - Primary (Chronic)    Exertional dyspnea seems to have improved.  GXT would suggest that is probably more related to deconditioning and lack of mobility then cardiac.  She never really got a lot of improvement in her symptoms after PCI.  This was the one lesion that was physiologically significant the other was for not.  This would suggest a noncardiac etiology for her dyspnea.  Plan:   Continue Imdur along with low-dose beta-blocker and ARB.  On Plavix now without aspirin --> okay to hold Plavix 5 to 7 days preop for surgeries.  On statin.         [name] [name] is quite stable - managing her loss as well as can be expected.  Feels OK physically & is staying active.  DOE is improving.    NO medication changes.   COVID-19 Education: The signs and symptoms of COVID-19 were discussed with the patient and how to seek care for testing (follow up with PCP or arrange E-visit).   The importance of social  distancing was discussed today.  Time:   Today, I have spent 25 minutes with the patient with telehealth technology discussing the above problems.  At least 10 minutes later time speaking with her was more thoughtful conversation lasting her husband's passing, and her Isidor Holts believes out at age 72 years old, after 11 years being under her care.  Given the social climbing today with COVID-19 restrictions and her recent loss, social company was more pertinent that discussing her health for the.   Medication Adjustments/Labs and Tests Ordered: Current medicines are reviewed at length with the patient today.  Concerns regarding medicines are outlined above.    Medication Instructions: none  Tests Ordered:  No orders of the defined types were placed in this encounter.  No new tests  Medication Changes: No orders of the defined types were placed in this encounter.   Disposition:  Follow up in 8 month(s)    Signed, Glenetta Hew, MD  02/21/2019 11:35 AM    [place]

## 2019-02-21 NOTE — Assessment & Plan Note (Signed)
She seems to be handling her husband's death and other social issues relatively well.  I think now having her great grandson moving seems to also be a difficult adjustment for her. 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  I think this is also to keep her more active.  Encouraged her to continue to stay active and continues a case with the remaining numbers of her family.  Good portion of our visit was talking about her social situation and how she is managing.  Allowing her to discuss the situation and be supportive listener.

## 2019-02-21 NOTE — Patient Instructions (Addendum)
Medication Instructions:  none  If you need a refill on your cardiac medications before your next appointment, please call your pharmacy.   Lab work: none    Testing/Procedures: none  Follow-Up: At [facility], you and your health needs are our priority.  As part of our continuing mission to provide you with exceptional heart care, we have created designated Provider Care Teams.  These Care Teams include your primary Cardiologist (physician) and Advanced Practice Providers (APPs -  Physician Assistants and Nurse Practitioners) who all work together to provide you with the care you need, when you need it. . You will need a follow up appointment in  8  Months- March 2021.  Please call our office 2 months in advance to schedule this appointment.  You may see Glenetta Hew, MD or one of the following Advanced Practice Providers on your designated Care Team:   . Rosaria Ferries, PA-C . Jory Sims, DNP, ANP  Any Other Special Instructions Will Be Listed Below (If Applicable).

## 2019-02-21 NOTE — Telephone Encounter (Signed)
Spoke to patient instruction given from televisit 6/30. avs summary will be mailed . Patient verbalized understanding

## 2019-02-21 NOTE — Assessment & Plan Note (Signed)
Lipids look great as of February. Seems to be doing okay on moderate-dose Lipitor.

## 2019-02-26 NOTE — Progress Notes (Signed)
I reviewed health advisor's note, was available for consultation, and agree with documentation and plan. Farrel Guimond MD  

## 2019-03-15 ENCOUNTER — Encounter: Payer: Self-pay | Admitting: Gastroenterology

## 2019-04-04 ENCOUNTER — Telehealth: Payer: Self-pay | Admitting: *Deleted

## 2019-04-04 NOTE — Telephone Encounter (Signed)
Received teamhealth message that pt wants to make appt b/c her hemoglobin keeps falling.  Called pt & she states she has been seen here before, last time in Feb of 2019.  Message sent to Dr Lindi Adie.

## 2019-04-05 ENCOUNTER — Telehealth: Payer: Self-pay | Admitting: Hematology and Oncology

## 2019-04-05 NOTE — Telephone Encounter (Signed)
Scheduled appt per 8/12 sch message - pt aware of appt date and time   

## 2019-04-06 NOTE — Assessment & Plan Note (Signed)
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 ( anemia of chronic inflammation), patient has chronic polymyalgia rheumatica I reviewed her blood counts with her. 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  Lab review  10/12/2017: Hemoglobin 10.2, MCV 94, normal WBC and platelet counts, creatinine 1.02 04/13/2019:  Return to clinic once here for follow-up with labs

## 2019-04-10 ENCOUNTER — Other Ambulatory Visit: Payer: Self-pay

## 2019-04-10 DIAGNOSIS — D638 Anemia in other chronic diseases classified elsewhere: Secondary | ICD-10-CM

## 2019-04-10 NOTE — Progress Notes (Signed)
Patient Care Team: Tower, [name], MD as PCP - General Ellyn Hack Leonie Green, MD as PCP - Cardiology (Cardiology) Suella Broad, MD as Consulting Physician (Physical Medicine and Rehabilitation) Gavin Pound, MD as Consulting Physician (Rheumatology) Haverstock, Jennefer Bravo, MD as Referring Physician (Dermatology) Nicholas Lose, MD as Consulting Physician (Hematology and Oncology)  DIAGNOSIS:    ICD-10-CM   1. Anemia of chronic disease  D63.8     CHIEF COMPLIANT: Follow-up of worsening anemia  INTERVAL HISTORY: [name] [name] is a 78 y.o. with above-mentioned history of anemia. I last saw her 1.5 years ago. Labs on 10/19/18 showed: Hg 9.7, HCT 29.4, MCV 100.1. She presents to the clinic today for follow-up due to worsening anemia.   REVIEW OF SYSTEMS:   Constitutional: Denies fevers, chills or abnormal weight loss Eyes: Denies blurriness of vision Ears, nose, mouth, throat, and face: Denies mucositis or sore throat Respiratory: Denies cough, dyspnea or wheezes Cardiovascular: Denies palpitation, chest discomfort Gastrointestinal: Denies nausea, heartburn or change in bowel habits Skin: Denies abnormal skin rashes Lymphatics: Denies new lymphadenopathy or easy bruising Neurological: Denies numbness, tingling or new weaknesses Behavioral/Psych: Mood is stable, no new changes  Extremities: No lower extremity edema Breast: denies any pain or lumps or nodules in either breasts All other systems were reviewed with the patient and are negative.  I have reviewed the past medical history, past surgical history, social history and family history with the patient and they are unchanged from previous note.  ALLERGIES:  is allergic to sulfonamide derivatives; ace inhibitors; oxaprozin; pregabalin; and amoxicillin-pot clavulanate.  MEDICATIONS:  Current Outpatient Medications  Medication Sig Dispense Refill  . acetaminophen (TYLENOL) 500 MG tablet Take 1,000 mg by mouth 2 (two) times daily  as needed for moderate pain or headache.     Marland Kitchen atorvastatin (LIPITOR) 20 MG tablet TAKE 1 TABLET BY MOUTH EVERY DAY 90 tablet 1  . busPIRone (BUSPAR) 15 MG tablet Take 0.5-1 tablets (7.5-15 mg total) by mouth 2 (two) times daily. 60 tablet 11  . clopidogrel (PLAVIX) 75 MG tablet Take 1 tablet (75 mg total) by mouth daily with breakfast. 90 tablet 3  . cyclobenzaprine (FLEXERIL) 5 MG tablet Take 1 tablet (5 mg total) by mouth 3 (three) times daily as needed for muscle spasms. 5 tablet 0  . ergocalciferol (VITAMIN D2) 1.25 MG (50000 UT) capsule Take 1 capsule (50,000 Units total) by mouth once a week. 12 capsule 0  . folic acid (FOLVITE) 1 MG tablet Take 2 mg by mouth daily.     Marland Kitchen HYDROcodone-acetaminophen (NORCO/VICODIN) 5-325 MG tablet Take 1-2 tablets by mouth every 4 (four) hours as needed for moderate pain or severe pain. 30 tablet 0  . isosorbide mononitrate (IMDUR) 60 MG 24 hr tablet TAKE 1 TABLET BY MOUTH EVERY DAY 90 tablet 3  . losartan-hydrochlorothiazide (HYZAAR) 50-12.5 MG tablet Take 1 tablet by mouth daily. 90 tablet 3  . Melatonin 3 MG CAPS Take 9 mg by mouth at bedtime.     . methotrexate (RHEUMATREX) 2.5 MG tablet Take 17.5 mg by mouth once a week. (7 tablets)    . metoprolol tartrate (LOPRESSOR) 25 MG tablet TAKE 1 TABLET BY MOUTH TWICE A DAY 180 tablet 1  . nitroGLYCERIN (NITROSTAT) 0.4 MG SL tablet Place 1 tablet (0.4 mg total) under the tongue every 5 (five) minutes as needed for chest pain. 25 tablet 5  . pantoprazole (PROTONIX) 40 MG tablet TAKE 1 TABLET BY MOUTH EVERY DAY 90 tablet 1  .  raloxifene (EVISTA) 60 MG tablet Take 1 tablet (60 mg total) by mouth daily. 90 tablet 3   No current facility-administered medications for this visit.     PHYSICAL EXAMINATION: ECOG PERFORMANCE STATUS: 1 - Symptomatic but completely ambulatory  Vitals:   04/11/19 1505  BP: (!) 128/58  Pulse: 75  Resp: 16  Temp: 98.9 F (37.2 C)  SpO2: 98%   Filed Weights   04/11/19 1505   Weight: 169 lb (76.7 kg)    GENERAL: alert, no distress and comfortable SKIN: skin color, texture, turgor are normal, no rashes or significant lesions EYES: normal, Conjunctiva are pink and non-injected, sclera clear OROPHARYNX: no exudate, no erythema and lips, buccal mucosa, and tongue normal  NECK: supple, thyroid normal size, non-tender, without nodularity LYMPH: no palpable lymphadenopathy in the cervical, axillary or inguinal LUNGS: clear to auscultation and percussion with normal breathing effort HEART: regular rate & rhythm and no murmurs and no lower extremity edema ABDOMEN: abdomen soft, non-tender and normal bowel sounds MUSCULOSKELETAL: no cyanosis of digits and no clubbing  NEURO: alert & oriented x 3 with fluent speech, no focal motor/sensory deficits EXTREMITIES: No lower extremity edema  LABORATORY DATA:  I have reviewed the data as listed CMP Latest Ref Rng & Units 10/19/2018 09/16/2018 12/18/2017  Glucose 70 - 99 mg/dL 85 93 174(H)  BUN 6 - 23 mg/dL 16 25 18   Creatinine 0.40 - 1.20 mg/dL 0.89 1.01(H) 1.03(H)  Sodium 135 - 145 mEq/L 140 140 131(L)  Potassium 3.5 - 5.1 mEq/L 4.5 3.8 4.1  Chloride 96 - 112 mEq/L 110 104 99(L)  CO2 19 - 32 mEq/L 22 26 23   Calcium 8.4 - 10.5 mg/dL 8.6 8.5(L) 7.9(L)  Total Protein 6.0 - 8.3 g/dL 6.0 5.8(L) -  Total Bilirubin 0.2 - 1.2 mg/dL 0.4 0.4 -  Alkaline Phos 39 - 117 U/L 63 - -  AST 0 - 37 U/L 17 17 -  ALT 0 - 35 U/L 7 6 -    Lab Results  Component Value Date   WBC 6.8 04/11/2019   HGB 9.0 (L) 04/11/2019   HCT 28.3 (L) 04/11/2019   MCV 96.6 04/11/2019   PLT 237 04/11/2019   NEUTROABS 4.7 04/11/2019    ASSESSMENT & PLAN:  Anemia of chronic disease 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 ( anemia of chronic inflammation), patient has chronic polymyalgia rheumatica I reviewed her blood counts with her. 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  Lab review  10/12/2017: Hemoglobin 10.2, MCV 94, normal  WBC and platelet counts, creatinine 1.02 04/13/2019: Hemoglobin 9  Because of anemia of chronic disease I recommended that we start Aranesp injections.  She will start this next week.  The goal of treatment is to keep hemoglobin greater than 10. If the hemoglobin goes over 10 then we will hold Aranesp. I will see her back in 1 month with a CBC and injection appointments.  No orders of the defined types were placed in this encounter.  The patient has a good understanding of the overall plan. she agrees with it. she will call with any problems that may develop before the next visit here.  Nicholas Lose, MD 04/11/2019  Julious Oka Dorshimer am acting as scribe for Dr. Nicholas Lose.  I have reviewed the above documentation for accuracy and completeness, and I agree with the above.

## 2019-04-11 ENCOUNTER — Inpatient Hospital Stay: Payer: Medicare Other | Attending: Hematology and Oncology | Admitting: Hematology and Oncology

## 2019-04-11 ENCOUNTER — Other Ambulatory Visit: Payer: Self-pay

## 2019-04-11 ENCOUNTER — Inpatient Hospital Stay: Payer: Medicare Other

## 2019-04-11 DIAGNOSIS — M353 Polymyalgia rheumatica: Secondary | ICD-10-CM | POA: Insufficient documentation

## 2019-04-11 DIAGNOSIS — D638 Anemia in other chronic diseases classified elsewhere: Secondary | ICD-10-CM

## 2019-04-11 DIAGNOSIS — Z79899 Other long term (current) drug therapy: Secondary | ICD-10-CM | POA: Insufficient documentation

## 2019-04-11 LAB — CBC WITH DIFFERENTIAL (CANCER CENTER ONLY)
Abs Immature Granulocytes: 0.05 10*3/uL (ref 0.00–0.07)
Basophils Absolute: 0 10*3/uL (ref 0.0–0.1)
Basophils Relative: 0 %
Eosinophils Absolute: 0.1 10*3/uL (ref 0.0–0.5)
Eosinophils Relative: 2 %
HCT: 28.3 % — ABNORMAL LOW (ref 36.0–46.0)
Hemoglobin: 9 g/dL — ABNORMAL LOW (ref 12.0–15.0)
Immature Granulocytes: 1 %
Lymphocytes Relative: 21 %
Lymphs Abs: 1.4 10*3/uL (ref 0.7–4.0)
MCH: 30.7 pg (ref 26.0–34.0)
MCHC: 31.8 g/dL (ref 30.0–36.0)
MCV: 96.6 fL (ref 80.0–100.0)
Monocytes Absolute: 0.5 10*3/uL (ref 0.1–1.0)
Monocytes Relative: 8 %
Neutro Abs: 4.7 10*3/uL (ref 1.7–7.7)
Neutrophils Relative %: 68 %
Platelet Count: 237 10*3/uL (ref 150–400)
RBC: 2.93 MIL/uL — ABNORMAL LOW (ref 3.87–5.11)
RDW: 14.1 % (ref 11.5–15.5)
WBC Count: 6.8 10*3/uL (ref 4.0–10.5)
nRBC: 0 % (ref 0.0–0.2)

## 2019-04-11 MED ORDER — METHOTREXATE 2.5 MG PO TABS: 15.0000 mg | ORAL_TABLET | ORAL | Status: DC

## 2019-04-12 LAB — IRON AND TIBC
Iron: 29 ug/dL — ABNORMAL LOW (ref 41–142)
Saturation Ratios: 10 % — ABNORMAL LOW (ref 21–57)
TIBC: 295 ug/dL (ref 236–444)
UIBC: 267 ug/dL (ref 120–384)

## 2019-04-12 LAB — FERRITIN: Ferritin: 192 ng/mL (ref 11–307)

## 2019-04-13 ENCOUNTER — Telehealth: Payer: Self-pay | Admitting: Hematology and Oncology

## 2019-04-13 ENCOUNTER — Other Ambulatory Visit: Payer: Self-pay | Admitting: Hematology and Oncology

## 2019-04-13 NOTE — Telephone Encounter (Signed)
I talk with patient regarding schedule  

## 2019-04-14 ENCOUNTER — Other Ambulatory Visit: Payer: Self-pay | Admitting: Hematology and Oncology

## 2019-04-15 ENCOUNTER — Other Ambulatory Visit: Payer: Self-pay | Admitting: Cardiology

## 2019-04-17 ENCOUNTER — Telehealth: Payer: Self-pay | Admitting: Hematology and Oncology

## 2019-04-17 NOTE — Telephone Encounter (Signed)
Added labs to injections oer 8/24 sch message - pt aware

## 2019-04-18 ENCOUNTER — Inpatient Hospital Stay: Payer: Medicare Other

## 2019-04-18 ENCOUNTER — Other Ambulatory Visit: Payer: Self-pay | Admitting: *Deleted

## 2019-04-18 ENCOUNTER — Other Ambulatory Visit: Payer: Self-pay

## 2019-04-18 VITALS — BP 128/62 | HR 72 | Temp 98.9°F | Resp 19

## 2019-04-18 DIAGNOSIS — D638 Anemia in other chronic diseases classified elsewhere: Secondary | ICD-10-CM

## 2019-04-18 DIAGNOSIS — M353 Polymyalgia rheumatica: Secondary | ICD-10-CM | POA: Diagnosis not present

## 2019-04-18 LAB — IRON AND TIBC
Iron: 30 ug/dL — ABNORMAL LOW (ref 41–142)
Saturation Ratios: 11 % — ABNORMAL LOW (ref 21–57)
TIBC: 275 ug/dL (ref 236–444)
UIBC: 245 ug/dL (ref 120–384)

## 2019-04-18 LAB — CBC WITH DIFFERENTIAL (CANCER CENTER ONLY)
Abs Immature Granulocytes: 0.03 10*3/uL (ref 0.00–0.07)
Basophils Absolute: 0 10*3/uL (ref 0.0–0.1)
Basophils Relative: 1 %
Eosinophils Absolute: 0.2 10*3/uL (ref 0.0–0.5)
Eosinophils Relative: 3 %
HCT: 28.7 % — ABNORMAL LOW (ref 36.0–46.0)
Hemoglobin: 9 g/dL — ABNORMAL LOW (ref 12.0–15.0)
Immature Granulocytes: 1 %
Lymphocytes Relative: 20 %
Lymphs Abs: 1.2 10*3/uL (ref 0.7–4.0)
MCH: 30.7 pg (ref 26.0–34.0)
MCHC: 31.4 g/dL (ref 30.0–36.0)
MCV: 98 fL (ref 80.0–100.0)
Monocytes Absolute: 0.6 10*3/uL (ref 0.1–1.0)
Monocytes Relative: 10 %
Neutro Abs: 3.8 10*3/uL (ref 1.7–7.7)
Neutrophils Relative %: 65 %
Platelet Count: 200 10*3/uL (ref 150–400)
RBC: 2.93 MIL/uL — ABNORMAL LOW (ref 3.87–5.11)
RDW: 13.8 % (ref 11.5–15.5)
WBC Count: 5.8 10*3/uL (ref 4.0–10.5)
nRBC: 0 % (ref 0.0–0.2)

## 2019-04-18 LAB — FERRITIN: Ferritin: 149 ng/mL (ref 11–307)

## 2019-04-18 MED ORDER — EPOETIN ALFA-EPBX 40000 UNIT/ML IJ SOLN
40000.0000 [IU] | Freq: Once | INTRAMUSCULAR | Status: AC
  Administered 2019-04-18: 40000 [IU] via SUBCUTANEOUS
  Filled 2019-04-18: qty 1

## 2019-04-18 NOTE — Patient Instructions (Signed)

## 2019-05-03 ENCOUNTER — Telehealth: Payer: Self-pay | Admitting: General Surgery

## 2019-05-03 ENCOUNTER — Ambulatory Visit (INDEPENDENT_AMBULATORY_CARE_PROVIDER_SITE_OTHER): Payer: Medicare Other | Admitting: Gastroenterology

## 2019-05-03 ENCOUNTER — Encounter: Payer: Self-pay | Admitting: Gastroenterology

## 2019-05-03 VITALS — BP 134/66 | HR 86 | Temp 97.9°F | Ht 64.5 in | Wt 171.0 lb

## 2019-05-03 DIAGNOSIS — D508 Other iron deficiency anemias: Secondary | ICD-10-CM | POA: Diagnosis not present

## 2019-05-03 DIAGNOSIS — K9089 Other intestinal malabsorption: Secondary | ICD-10-CM

## 2019-05-03 DIAGNOSIS — K580 Irritable bowel syndrome with diarrhea: Secondary | ICD-10-CM

## 2019-05-03 MED ORDER — COLESTIPOL HCL 1 G PO TABS: 1.0000 g | ORAL_TABLET | Freq: Two times a day (BID) | ORAL | 3 refills | Status: DC

## 2019-05-03 NOTE — Telephone Encounter (Signed)
 [facility] Pre-operative Risk Assessment     Request for surgical clearance:     Endoscopy Procedure  What type of surgery is being performed?     Colonoscopy  When is this surgery scheduled?     Not scheduled yet  What type of clearance is required ?   Pharmacy  Are there any medications that need to be held prior to surgery and how long? plavix  Practice name and name of physician performing surgery?      [facility]  What is your office phone and fax number?      Phone- [phone]  [phone]  Anesthesia type (None, local, MAC, general) ?       MAC

## 2019-05-03 NOTE — Patient Instructions (Signed)
We have sent the following medications to your pharmacy for you to pick up at your convenience:  Colestid 1 gram two times a day  Call us back if you decide to schedule your colonoscopy you will be scheduled for a pre-visit at that time.  Lactose-Free Diet, Adult If you have lactose intolerance, you are not able to digest lactose. Lactose is a natural sugar found mainly in dairy milk and dairy products. You may need to avoid all foods and beverages that contain lactose. A lactose-free diet can help you do this. Which foods have lactose? Lactose is found in dairy milk and dairy products, such as:  Yogurt.  Cheese.  Butter.  Margarine.  Sour cream.  Cream.  Whipped toppings and nondairy creamers.  Ice cream and other dairy-based desserts. Lactose is also found in foods or products made with dairy milk or milk ingredients. To find out whether a food contains dairy milk or a milk ingredient, look at the ingredients list. Avoid foods with the statement "May contain milk" and foods that contain:  Milk powder.  Whey.  Curd.  Caseinate.  Lactose.  Lactalbumin.  Lactoglobulin. What are alternatives to dairy milk and foods made with milk products?  Lactose-free milk.  Soy milk with added calcium and vitamin D.  Almond milk, coconut milk, rice milk, or other nondairy milk alternatives with added calcium and vitamin D. Note that these are low in protein.  Soy products, such as soy yogurt, soy cheese, soy ice cream, and soy-based sour cream.  Other nut milk products, such as almond yogurt, almond cheese, cashew yogurt, cashew cheese, cashew ice cream, coconut yogurt, and coconut ice cream. What are tips for following this plan?  Do not consume foods, beverages, vitamins, minerals, or medicines containing lactose. Read ingredient lists carefully.  Look for the words "lactose-free" on labels.  Use lactase enzyme drops or tablets as directed by your health care provider.   Use lactose-free milk or a milk alternative, such as soy milk or almond milk, for drinking and cooking.  Make sure you get enough calcium and vitamin D in your diet. A lactose-free eating plan can be lacking in these important nutrients.  Take calcium and vitamin D supplements as directed by your health care provider. Talk to your health care provider about supplements if you are not able to get enough calcium and vitamin D from food. What foods can I eat?  Fruits All fresh, canned, frozen, or dried fruits that are not processed with lactose. Vegetables All fresh, frozen, and canned vegetables without cheese, cream, or butter sauces. Grains Any that are not made with dairy milk or dairy products. Meats and other proteins Any meat, fish, poultry, and other protein sources that are not made with dairy milk or dairy products. Soy cheese and yogurt. Fats and oils Any that are not made with dairy milk or dairy products. Beverages Lactose-free milk. Soy, rice, or almond milk with added calcium and vitamin D. Fruit and vegetable juices. Sweets and desserts Any that are not made with dairy milk or dairy products. Seasonings and condiments Any that are not made with dairy milk or dairy products. Calcium Calcium is found in many foods that contain lactose and is important for bone health. The amount of calcium you need depends on your age:  Adults younger than 50 years: 1,000 mg of calcium a day.  Adults older than 50 years: 1,200 mg of calcium a day. If you are not getting enough calcium, you may  get it from other sources, including:  Orange juice with calcium added. There are 300-350 mg of calcium in 1 cup of orange juice.  Calcium-fortified soy milk. There are 300-400 mg of calcium in 1 cup of calcium-fortified soy milk.  Calcium-fortified rice or almond milk. There are 300 mg of calcium in 1 cup of calcium-fortified rice or almond milk.  Calcium-fortified breakfast cereals. There are  100-1,000 mg of calcium in calcium-fortified breakfast cereals.  Spinach, cooked. There are 145 mg of calcium in  cup of cooked spinach.  Edamame, cooked. There are 130 mg of calcium in  cup of cooked edamame.  Collard greens, cooked. There are 125 mg of calcium in  cup of cooked collard greens.  Kale, frozen or cooked. There are 90 mg of calcium in  cup of cooked or frozen kale.  Almonds. There are 95 mg of calcium in  cup of almonds.  Broccoli, cooked. There are 60 mg of calcium in 1 cup of cooked broccoli. The items listed above may not be a complete list of recommended foods and beverages. Contact a dietitian for more options. What foods are not recommended? Fruits None, unless they are made with dairy milk or dairy products. Vegetables None, unless they are made with dairy milk or dairy products. Grains Any grains that are made with dairy milk or dairy products. Meats and other proteins None, unless they are made with dairy milk or dairy products. Dairy All dairy products, including milk, goat's milk, buttermilk, kefir, acidophilus milk, flavored milk, evaporated milk, condensed milk, dulce de Calio, eggnog, yogurt, cheese, and cheese spreads. Fats and oils Any that are made with milk or milk products. Margarines and salad dressings that contain milk or cheese. Cream. Half and half. Cream cheese. Sour cream. Chip dips made with sour cream or yogurt. Beverages Hot chocolate. Cocoa with lactose. Instant iced teas. Powdered fruit drinks. Smoothies made with dairy milk or yogurt. Sweets and desserts Any that are made with milk or milk products. Seasonings and condiments Chewing gum that has lactose. Spice blends if they contain lactose. Artificial sweeteners that contain lactose. Nondairy creamers. The items listed above may not be a complete list of foods and beverages to avoid. Contact a dietitian for more information. Summary  If you are lactose intolerant, it means that  you have a hard time digesting lactose, a natural sugar found in milk and milk products.  Following a lactose-free diet can help you manage this condition.  Calcium is important for bone health and is found in many foods that contain lactose. Talk with your health care provider about other sources of calcium. This information is not intended to replace advice given to you by your health care provider. Make sure you discuss any questions you have with your health care provider. Document Released: 01/30/2002 Document Revised: 09/07/2017 Document Reviewed: 09/07/2017 Elsevier Patient Education  2020 [place] you for choosing me and [facility]  Harl Bowie, MD

## 2019-05-03 NOTE — Progress Notes (Signed)
[name] [name]    [id number]    12-Apr-1941  Primary Care Physician:Tower, [name], MD  Referring Physician: Tower, [name], MD [place],  [place] [address]   Chief complaint: Chronic anemia, to discuss colorectal cancer screening  HPI:  78 year old female with hypertension, CAD status post PCI on Plavix, arctic regurgitation, GERD with worsening of chronic anemia, high MCV 100.  Low iron 29 and saturation 10%.  Ferritin 192 She has history of chronic anemia but her hemoglobin has been trending down from baseline of 10-11 to 9  She recently has not had any fecal Hemoccult testing Denies any melena or bright red blood per rectum No abdominal pain, dysphagia, odynophagia, nausea, or vomiting.  She has increased bowel frequency with fecal urgency, has to have a bowel movement after every time she eats.  She feels it is getting worse as she is aging.  EGD by Dr. Sharlett Iles April 01, 2009: 3 cm hiatal hernia, stricture at the GE junction, likely benign dilated with Maloney dilator to 17 mm.  Normal stomach and duodenum. Colonoscopy by Dr. Sharlett Iles April 01, 2009: Sigmoid and descending colon diverticulosis otherwise normal exam  Outpatient Encounter Medications as of 05/03/2019  Medication Sig  . acetaminophen (TYLENOL) 500 MG tablet Take 1,000 mg by mouth 2 (two) times daily as needed for moderate pain or headache.   Marland Kitchen atorvastatin (LIPITOR) 20 MG tablet TAKE 1 TABLET BY MOUTH EVERY DAY  . busPIRone (BUSPAR) 15 MG tablet Take 0.5-1 tablets (7.5-15 mg total) by mouth 2 (two) times daily.  . clopidogrel (PLAVIX) 75 MG tablet Take 1 tablet (75 mg total) by mouth daily with breakfast.  . cyclobenzaprine (FLEXERIL) 5 MG tablet Take 1 tablet (5 mg total) by mouth 3 (three) times daily as needed for muscle spasms.  . ergocalciferol (VITAMIN D2) 1.25 MG (50000 UT) capsule Take 1 capsule (50,000 Units total) by mouth once a week.  . folic acid (FOLVITE) 1 MG tablet  Take 2 mg by mouth daily.   Marland Kitchen HYDROcodone-acetaminophen (NORCO/VICODIN) 5-325 MG tablet Take 1-2 tablets by mouth every 4 (four) hours as needed for moderate pain or severe pain.  . isosorbide mononitrate (IMDUR) 60 MG 24 hr tablet TAKE 1 TABLET BY MOUTH EVERY DAY  . losartan-hydrochlorothiazide (HYZAAR) 50-12.5 MG tablet Take 1 tablet by mouth daily.  . Melatonin 3 MG CAPS Take 9 mg by mouth at bedtime.   . methotrexate (RHEUMATREX) 2.5 MG tablet Take 6 tablets (15 mg total) by mouth once a week. (7 tablets)  . metoprolol tartrate (LOPRESSOR) 25 MG tablet TAKE 1 TABLET BY MOUTH TWICE A DAY  . nitroGLYCERIN (NITROSTAT) 0.4 MG SL tablet Place 1 tablet (0.4 mg total) under the tongue every 5 (five) minutes as needed for chest pain.  . pantoprazole (PROTONIX) 40 MG tablet TAKE 1 TABLET BY MOUTH EVERY DAY  . raloxifene (EVISTA) 60 MG tablet Take 1 tablet (60 mg total) by mouth daily.   No facility-administered encounter medications on file as of 05/03/2019.     Allergies as of 05/03/2019 - Review Complete 02/21/2019  Allergen Reaction Noted  . Sulfonamide derivatives Other (See Comments)   . Ace inhibitors  04/16/2010  . Oxaprozin    . Pregabalin Swelling 01/26/2008  . Amoxicillin-pot clavulanate Nausea And Vomiting     Past Medical History:  Diagnosis Date  . Anemia    of chronic disease  . Anxiety   .  Arthritis    "psoratic arthritis; new dx" (09/11/2016)  . CAD S/P percutaneous coronary angioplasty 08/2016   mLCx 90% -> 0% (2.75 mm x 16 mm) Synergy DES PCI Ostial D1 70% (not PTCA target). Moderate ostial and mD2.  LAD tandem 50% stenoses w/ negative FFR (0.86)  . Cancer ([facility])    skin cancer on leg  . Chronic lower back pain   . Complication of anesthesia 1980 and 1982   trouble waking up after breast biopsy, many surgeries since no problem  . Depression   . Dyspnea on effort - CHRONIC    partial relief of Sx post PCI of L Circumflex.  . Fibrocystic breast   . GERD  (gastroesophageal reflux disease)   . Headache    "monthly" (09/11/2016)  . History of blood transfusion    "several since age 49; I've had them after most of my surgeries" (09/11/2016)  . History of myocardial infarction    "doctor said there are signs I've had a heart attack; don't know when"  - Myoview [name] 2018 indicated small Inferior - Inferoseptal Infarct (vs. rest ischemia). Echo with inferoseptal hypokinesis.  Marland Kitchen Hx of skin cancer, basal cell 2013 and 2012   right inner, lower leg; several scattered around my body  . Hypertension   . Iron deficiency anemia    "I've had an iron infusion"  . Osteoarthritis   . Osteopenia   . Peptic ulcer disease    H pylori  . PMR (polymyalgia rheumatica) ([facility])   . PONV (postoperative nausea and vomiting)   . Psoriasis     Past Surgical History:  Procedure Laterality Date  . APPENDECTOMY    . BACK SURGERY     x 6   . BREAST BIOPSY Left 1980 and 1982   "benign"  . CARDIAC CATHETERIZATION N/A 09/11/2016   Procedure: Left Heart Cath and Coronary Angiography;  Surgeon: Leonie Man, MD;  Location: [facility];  Service: Cardiovascular: mLCx 90% * (PCI). Ostial D1 70% (not PTCA target). Mod ostial and mD2.  LAD tandem 50% stenoses w/ negative FFR (0.86)  . CARDIAC CATHETERIZATION N/A 09/11/2016   Procedure: Intravascular Pressure Wire/FFR Study;  Surgeon: Leonie Man, MD;  Location: [facility];  Service: Cardiovascular: --  LAD tandem 50% stenoses w/ negative FFR (0.86  . CARDIAC CATHETERIZATION N/A 09/11/2016   Procedure: Coronary Stent Intervention;  Surgeon: Leonie Man, MD;  Location: [facility];  Service: Cardiovascular: -- DES PCI mLCx 90%--0% SYNERGY DES 2.75 MM X 16 MM.  Marland Kitchen CARDIAC CATHETERIZATION  2001   non-obstructive CAD  . CATARACT EXTRACTION W/ INTRAOCULAR LENS  IMPLANT, BILATERAL  July -  August 2017  . DILATION AND CURETTAGE OF UTERUS  X 3  . ESOPHAGOGASTRODUODENOSCOPY  03/2009   ulcer,HP, GERD  stricture  . EXCISIONAL HEMORRHOIDECTOMY    . HIP ARTHROPLASTY Left 2010  . JOINT REPLACEMENT    . KNEE ARTHROSCOPY Bilateral   . LAPAROSCOPIC CHOLECYSTECTOMY  2005  . LUMBAR DISC SURGERY  11/1984; 1987; 1989; 2004; 2005 X 2   deg. disk lumber spine   . MOHS SURGERY Right    "inside my lower leg"  . NASAL SINUS SURGERY  1970s  . NM MYOVIEW LTD  08/28/2016   INTERMEDIATE RISK (b/c reduced EF & ? small infarct) -- EF ~35-40%.  ? Prior Small Inferior-apical & septal infarct (w/o ischemia).  Difuse HK - worse in inferoapex.  Suggest prior infarct with no ischemia. Ischemic  area correlates with echo WMA  . REVERSE SHOULDER ARTHROPLASTY Left 12/17/2017   Procedure: LEFT REVERSE SHOULDER ARTHROPLASTY;  Surgeon: Netta Cedars, MD;  Location: [facility];  Service: Orthopedics;  Laterality: Left;  . TOTAL HIP ARTHROPLASTY Right 04/04/2013   Procedure: RIGHT TOTAL HIP ARTHROPLASTY ANTERIOR APPROACH;  Surgeon: Mauri Pole, MD;  Location: [facility];  Service: Orthopedics;  Laterality: Right;  . TOTAL KNEE ARTHROPLASTY Right 2009  . TRANSTHORACIC ECHOCARDIOGRAM  08/2016   mild conc LVH. EF 45-50% - anteroseptal & inferoseptal HK. Mod AI.  Marland Kitchen TRANSTHORACIC ECHOCARDIOGRAM  08/2017   (post PCI) --> EF normalized:  Normal LV systolic function; mild diastolic dysfunction; mild   LVH; sclerotic aortic valve with mild AI.  No RWMA  . TUBAL LIGATION    . VAGINAL HYSTERECTOMY  1970s   partial     Family History  Problem Relation Age of Onset  . Arthritis Mother   . Emphysema Mother   . Hyperlipidemia Mother   . Hypertension Mother   . Heart disease Brother        CAD    Social History   Socioeconomic History  . Marital status: Married    Spouse name: Not on file  . Number of children: Not on file  . Years of education: Not on file  . Highest education level: Not on file  Occupational History  . Not on file  Social Needs  . Financial resource strain: Not on file  . Food insecurity    Worry: Not on  file    Inability: Not on file  . Transportation needs    Medical: Not on file    Non-medical: Not on file  Tobacco Use  . Smoking status: Never Smoker  . Smokeless tobacco: Never Used  Substance and Sexual Activity  . Alcohol use: Yes    Alcohol/week: 0.0 standard drinks    Comment: 09/11/2016 "might have a margarita q 3-4 months"  . Drug use: No  . Sexual activity: Not Currently  Lifestyle  . Physical activity    Days per week: Not on file    Minutes per session: Not on file  . Stress: Not on file  Relationships  . Social Herbalist on phone: Not on file    Gets together: Not on file    Attends religious service: Not on file    Active member of club or organization: Not on file    Attends meetings of clubs or organizations: Not on file    Relationship status: Not on file  . Intimate partner violence    Fear of current or ex partner: Not on file    Emotionally abused: Not on file    Physically abused: Not on file    Forced sexual activity: Not on file  Other Topics Concern  . Not on file  Social History Narrative   Now Widowed as of [name] 2020.   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      Review of systems: Review of Systems  Constitutional: Negative for fever and chills.  HENT: Negative.   Eyes: Negative for blurred vision.  Respiratory: Negative for cough, shortness of breath and wheezing.   Cardiovascular: Negative for chest pain and palpitations.  Gastrointestinal: as per HPI Genitourinary:  Negative for dysuria, urgency, frequency and hematuria.  Musculoskeletal: Positive for myalgias, back pain and joint pain.  Skin: Negative for itching and rash.  Neurological: Negative for dizziness, tremors, focal weakness, seizures and loss of consciousness.  Endo/Heme/Allergies: Negative for  seasonal allergies.  Psychiatric/Behavioral: Negative for depression, suicidal ideas and hallucinations.  All other systems reviewed and are negative.   Physical Exam: Vitals:   05/03/19 1429  BP: 134/66  Pulse: 86  Temp: 97.9 F (36.6 C)   Body mass index is 28.9 kg/m. Gen:      No acute distress HEENT:  EOMI, sclera anicteric Neck:     No masses; no thyromegaly Lungs:    Clear to auscultation bilaterally; normal respiratory effort CV:         Regular rate and rhythm; no murmurs Abd:      + bowel sounds; soft, non-tender; no palpable masses, no distension Ext:    No edema; adequate peripheral perfusion Skin:      Warm and dry; no rash Neuro: alert and oriented x 3 Psych: normal mood and affect  Data Reviewed:  Reviewed labs, radiology imaging, old records and pertinent past GI work up   Assessment and Plan/Recommendations:  78 year old female with history of CAD on Plavix, chronic anemia with steady decline in hemoglobin. No overt GI bleeding Low iron level and saturation Will need to consider EGD and colonoscopy to exclude any neoplastic lesion or occult GI blood loss She is reluctant to undergo endoscopic evaluation given her age, would like to discuss with her family before she comes to a decision  Increased fecal urgency and frequency: Possible lactose intolerance, will do a trial of lactose-free diet for 1 week Start Colestid 1 g twice daily for possible bile salt induced diarrhea  Follow-up in 2 to 3 months or sooner if needed    K. Denzil Magnuson , MD    CC: Tower, [name], MD

## 2019-05-04 ENCOUNTER — Encounter: Payer: Self-pay | Admitting: Gastroenterology

## 2019-05-04 NOTE — Telephone Encounter (Signed)
encounter  Opened in error

## 2019-05-05 NOTE — Telephone Encounter (Signed)
Fine to hold Plavix for procedures as indicated in last office not.  [place]

## 2019-05-08 NOTE — Telephone Encounter (Signed)
ok 

## 2019-05-08 NOTE — Telephone Encounter (Signed)
Contacted the patient to advise her that we have received clearance for her to stop her plavix for 5 days prior to her EGD. The patient was not wanting to schedule at this point and stated she will call back when she is ready.

## 2019-05-18 ENCOUNTER — Other Ambulatory Visit: Payer: Self-pay | Admitting: *Deleted

## 2019-05-18 DIAGNOSIS — D638 Anemia in other chronic diseases classified elsewhere: Secondary | ICD-10-CM

## 2019-05-18 NOTE — Progress Notes (Signed)
Patient Care Team: Tower, [name], MD as PCP - General Ellyn Hack Leonie Green, MD as PCP - Cardiology (Cardiology) Suella Broad, MD as Consulting Physician (Physical Medicine and Rehabilitation) Gavin Pound, MD as Consulting Physician (Rheumatology) [place], Jennefer Bravo, MD as Referring Physician (Dermatology) Nicholas Lose, MD as Consulting Physician (Hematology and Oncology)  DIAGNOSIS:    ICD-10-CM   1. Anemia of chronic disease  D63.8     CHIEF COMPLIANT: Follow-up of anemia  INTERVAL HISTORY: [name] [name] is a 78 y.o. with above-mentioned history of anemia who receives Retacrit. She presents to the clinic today for follow-up.  She does not have any problems with the injection of Retacrit.  She does complain of generalized fatigue which is unchanged from before.  REVIEW OF SYSTEMS:   Constitutional: Denies fevers, chills or abnormal weight loss, complains of generalized fatigue and weakness Eyes: Denies blurriness of vision Ears, nose, mouth, throat, and face: Denies mucositis or sore throat Respiratory: Denies cough, dyspnea or wheezes Cardiovascular: Denies palpitation, chest discomfort Gastrointestinal: Denies nausea, heartburn or change in bowel habits Skin: Denies abnormal skin rashes Lymphatics: Denies new lymphadenopathy or easy bruising Neurological: Denies numbness, tingling or new weaknesses Behavioral/Psych: Mood is stable, no new changes  Extremities: No lower extremity edema Breast: denies any pain or lumps or nodules in either breasts All other systems were reviewed with the patient and are negative.  I have reviewed the past medical history, past surgical history, social history and family history with the patient and they are unchanged from previous note.  ALLERGIES:  is allergic to sulfonamide derivatives; ace inhibitors; oxaprozin; pregabalin; and amoxicillin-pot clavulanate.  MEDICATIONS:  Current Outpatient Medications  Medication Sig Dispense  Refill  . acetaminophen (TYLENOL) 500 MG tablet Take 1,000 mg by mouth 2 (two) times daily as needed for moderate pain or headache.     Marland Kitchen atorvastatin (LIPITOR) 20 MG tablet TAKE 1 TABLET BY MOUTH EVERY DAY 90 tablet 1  . busPIRone (BUSPAR) 15 MG tablet Take 0.5-1 tablets (7.5-15 mg total) by mouth 2 (two) times daily. 60 tablet 11  . clopidogrel (PLAVIX) 75 MG tablet Take 1 tablet (75 mg total) by mouth daily with breakfast. 90 tablet 3  . colestipol (COLESTID) 1 g tablet Take 1 tablet (1 g total) by mouth 2 (two) times daily. 60 tablet 3  . cyclobenzaprine (FLEXERIL) 5 MG tablet Take 1 tablet (5 mg total) by mouth 3 (three) times daily as needed for muscle spasms. 5 tablet 0  . folic acid (FOLVITE) 1 MG tablet Take 2 mg by mouth daily.     Marland Kitchen HYDROcodone-acetaminophen (NORCO/VICODIN) 5-325 MG tablet Take 1-2 tablets by mouth every 4 (four) hours as needed for moderate pain or severe pain. 30 tablet 0  . isosorbide mononitrate (IMDUR) 60 MG 24 hr tablet TAKE 1 TABLET BY MOUTH EVERY DAY 90 tablet 3  . losartan-hydrochlorothiazide (HYZAAR) 50-12.5 MG tablet Take 1 tablet by mouth daily. 90 tablet 3  . Melatonin 3 MG CAPS Take 9 mg by mouth at bedtime.     . methotrexate (RHEUMATREX) 2.5 MG tablet Take 6 tablets (15 mg total) by mouth once a week. (7 tablets) 4 tablet   . metoprolol tartrate (LOPRESSOR) 25 MG tablet TAKE 1 TABLET BY MOUTH TWICE A DAY 180 tablet 1  . nitroGLYCERIN (NITROSTAT) 0.4 MG SL tablet Place 1 tablet (0.4 mg total) under the tongue every 5 (five) minutes as needed for chest pain. 25 tablet 5  . pantoprazole (PROTONIX) 40 MG  tablet TAKE 1 TABLET BY MOUTH EVERY DAY 90 tablet 1  . raloxifene (EVISTA) 60 MG tablet Take 1 tablet (60 mg total) by mouth daily. 90 tablet 3  . Vitamin D, Cholecalciferol, 25 MCG (1000 UT) CAPS Take 1 capsule by mouth daily.     No current facility-administered medications for this visit.     PHYSICAL EXAMINATION: ECOG PERFORMANCE STATUS: 1 -  Symptomatic but completely ambulatory  Vitals:   05/19/19 1118  BP: 136/67  Pulse: 77  Resp: 19  Temp: 98.5 F (36.9 C)  SpO2: 99%   Filed Weights   05/19/19 1118  Weight: 169 lb (76.7 kg)    GENERAL: alert, no distress and comfortable SKIN: skin color, texture, turgor are normal, no rashes or significant lesions EYES: normal, Conjunctiva are pink and non-injected, sclera clear OROPHARYNX: no exudate, no erythema and lips, buccal mucosa, and tongue normal  NECK: supple, thyroid normal size, non-tender, without nodularity LYMPH: no palpable lymphadenopathy in the cervical, axillary or inguinal LUNGS: clear to auscultation and percussion with normal breathing effort HEART: regular rate & rhythm and no murmurs and no lower extremity edema ABDOMEN: abdomen soft, non-tender and normal bowel sounds MUSCULOSKELETAL: no cyanosis of digits and no clubbing  NEURO: alert & oriented x 3 with fluent speech, no focal motor/sensory deficits EXTREMITIES: No lower extremity edema  LABORATORY DATA:  I have reviewed the data as listed CMP Latest Ref Rng & Units 10/19/2018 09/16/2018 12/18/2017  Glucose 70 - 99 mg/dL 85 93 174(H)  BUN 6 - 23 mg/dL 16 25 18   Creatinine 0.40 - 1.20 mg/dL 0.89 1.01(H) 1.03(H)  Sodium 135 - 145 mEq/L 140 140 131(L)  Potassium 3.5 - 5.1 mEq/L 4.5 3.8 4.1  Chloride 96 - 112 mEq/L 110 104 99(L)  CO2 19 - 32 mEq/L 22 26 23   Calcium 8.4 - 10.5 mg/dL 8.6 8.5(L) 7.9(L)  Total Protein 6.0 - 8.3 g/dL 6.0 5.8(L) -  Total Bilirubin 0.2 - 1.2 mg/dL 0.4 0.4 -  Alkaline Phos 39 - 117 U/L 63 - -  AST 0 - 37 U/L 17 17 -  ALT 0 - 35 U/L 7 6 -    Lab Results  Component Value Date   WBC 6.7 05/19/2019   HGB 9.1 (L) 05/19/2019   HCT 28.9 (L) 05/19/2019   MCV 97.3 05/19/2019   PLT 231 05/19/2019   NEUTROABS 4.8 05/19/2019    ASSESSMENT & PLAN:  Anemia of chronic disease 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 ( anemia of chronic inflammation),  patient has chronic polymyalgia rheumatica I reviewed her blood counts with her. 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  Lab review  10/12/2017: Hemoglobin 10.2, MCV 94, normal WBC and platelet counts, creatinine 1.02 04/13/2019: Hemoglobin 9 05/19/2019: Hemoglobin 9.1  Current treatment: Retacrit 40,000 units we will make her appointments to be given weekly for the next 4 weeks.  The goal of treatment is to keep hemoglobin greater than 10. I discussed with her that her insurance did not approve Aranesp and therefore she will need to come weekly for the Retacrit injections.  I will see her back in 1 month with a CBC and injection appointments.    No orders of the defined types were placed in this encounter.  The patient has a good understanding of the overall plan. she agrees with it. she will call with any problems that may develop before the next visit here.  Nicholas Lose, MD 05/19/2019  Julious Oka Dorshimer am acting as  scribe for Dr. Jaculin Rasmus.  I have reviewed the above documentation for accuracy and completeness, and I agree with the above.       

## 2019-05-19 ENCOUNTER — Other Ambulatory Visit: Payer: Self-pay

## 2019-05-19 ENCOUNTER — Inpatient Hospital Stay (HOSPITAL_BASED_OUTPATIENT_CLINIC_OR_DEPARTMENT_OTHER): Payer: Medicare Other | Admitting: Hematology and Oncology

## 2019-05-19 ENCOUNTER — Inpatient Hospital Stay: Payer: Medicare Other

## 2019-05-19 ENCOUNTER — Inpatient Hospital Stay: Payer: Medicare Other | Attending: Hematology and Oncology

## 2019-05-19 DIAGNOSIS — D638 Anemia in other chronic diseases classified elsewhere: Secondary | ICD-10-CM

## 2019-05-19 DIAGNOSIS — M353 Polymyalgia rheumatica: Secondary | ICD-10-CM | POA: Insufficient documentation

## 2019-05-19 DIAGNOSIS — Z79899 Other long term (current) drug therapy: Secondary | ICD-10-CM | POA: Insufficient documentation

## 2019-05-19 DIAGNOSIS — R5383 Other fatigue: Secondary | ICD-10-CM | POA: Diagnosis not present

## 2019-05-19 LAB — IRON AND TIBC
Iron: 67 ug/dL (ref 41–142)
Saturation Ratios: 27 % (ref 21–57)
TIBC: 246 ug/dL (ref 236–444)
UIBC: 180 ug/dL (ref 120–384)

## 2019-05-19 LAB — CBC WITH DIFFERENTIAL (CANCER CENTER ONLY)
Abs Immature Granulocytes: 0.02 10*3/uL (ref 0.00–0.07)
Basophils Absolute: 0 10*3/uL (ref 0.0–0.1)
Basophils Relative: 1 %
Eosinophils Absolute: 0.3 10*3/uL (ref 0.0–0.5)
Eosinophils Relative: 5 %
HCT: 28.9 % — ABNORMAL LOW (ref 36.0–46.0)
Hemoglobin: 9.1 g/dL — ABNORMAL LOW (ref 12.0–15.0)
Immature Granulocytes: 0 %
Lymphocytes Relative: 16 %
Lymphs Abs: 1.1 10*3/uL (ref 0.7–4.0)
MCH: 30.6 pg (ref 26.0–34.0)
MCHC: 31.5 g/dL (ref 30.0–36.0)
MCV: 97.3 fL (ref 80.0–100.0)
Monocytes Absolute: 0.5 10*3/uL (ref 0.1–1.0)
Monocytes Relative: 7 %
Neutro Abs: 4.8 10*3/uL (ref 1.7–7.7)
Neutrophils Relative %: 71 %
Platelet Count: 231 10*3/uL (ref 150–400)
RBC: 2.97 MIL/uL — ABNORMAL LOW (ref 3.87–5.11)
RDW: 14.4 % (ref 11.5–15.5)
WBC Count: 6.7 10*3/uL (ref 4.0–10.5)
nRBC: 0 % (ref 0.0–0.2)

## 2019-05-19 LAB — FERRITIN: Ferritin: 156 ng/mL (ref 11–307)

## 2019-05-19 MED ORDER — EPOETIN ALFA-EPBX 40000 UNIT/ML IJ SOLN
40000.0000 [IU] | Freq: Once | INTRAMUSCULAR | Status: AC
  Administered 2019-05-19: 40000 [IU] via SUBCUTANEOUS
  Filled 2019-05-19: qty 1

## 2019-05-19 NOTE — Assessment & Plan Note (Signed)
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 ( anemia of chronic inflammation), patient has chronic polymyalgia rheumatica I reviewed her blood counts with her. 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  Lab review  10/12/2017: Hemoglobin 10.2, MCV 94, normal WBC and platelet counts, creatinine 1.02 04/13/2019: Hemoglobin 9 05/19/2019:  Current treatment: Retacrit 40,000 units weekly.  The goal of treatment is to keep hemoglobin greater than 10. I discussed with her that her insurance did not approve Aranesp and therefore she will need to come weekly for the Retacrit injections.  I will see her back in 1 month with a CBC and injection appointments.

## 2019-05-19 NOTE — Patient Instructions (Signed)

## 2019-05-22 ENCOUNTER — Telehealth: Payer: Self-pay | Admitting: Hematology and Oncology

## 2019-05-22 ENCOUNTER — Ambulatory Visit: Payer: Medicare Other

## 2019-05-22 ENCOUNTER — Other Ambulatory Visit: Payer: Medicare Other

## 2019-05-22 NOTE — Telephone Encounter (Signed)
I talk with patient regarding schedule, she asked for early time on Thursday

## 2019-05-23 ENCOUNTER — Other Ambulatory Visit: Payer: Self-pay

## 2019-05-23 MED ORDER — PANTOPRAZOLE SODIUM 40 MG PO TBEC: 40.0000 mg | DELAYED_RELEASE_TABLET | Freq: Every day | ORAL | 1 refills | Status: DC

## 2019-05-24 ENCOUNTER — Other Ambulatory Visit: Payer: Self-pay

## 2019-05-24 DIAGNOSIS — D638 Anemia in other chronic diseases classified elsewhere: Secondary | ICD-10-CM

## 2019-05-25 ENCOUNTER — Inpatient Hospital Stay: Payer: Medicare Other | Attending: Hematology and Oncology

## 2019-05-25 ENCOUNTER — Other Ambulatory Visit: Payer: Self-pay

## 2019-05-25 ENCOUNTER — Inpatient Hospital Stay: Payer: Medicare Other

## 2019-05-25 VITALS — BP 157/65 | HR 74 | Temp 98.7°F | Resp 18

## 2019-05-25 DIAGNOSIS — D638 Anemia in other chronic diseases classified elsewhere: Secondary | ICD-10-CM | POA: Insufficient documentation

## 2019-05-25 DIAGNOSIS — Z79899 Other long term (current) drug therapy: Secondary | ICD-10-CM | POA: Diagnosis not present

## 2019-05-25 DIAGNOSIS — M353 Polymyalgia rheumatica: Secondary | ICD-10-CM | POA: Insufficient documentation

## 2019-05-25 LAB — CBC WITH DIFFERENTIAL (CANCER CENTER ONLY)
Abs Immature Granulocytes: 0.05 10*3/uL (ref 0.00–0.07)
Basophils Absolute: 0 10*3/uL (ref 0.0–0.1)
Basophils Relative: 1 %
Eosinophils Absolute: 0.6 10*3/uL — ABNORMAL HIGH (ref 0.0–0.5)
Eosinophils Relative: 13 %
HCT: 30.4 % — ABNORMAL LOW (ref 36.0–46.0)
Hemoglobin: 9.5 g/dL — ABNORMAL LOW (ref 12.0–15.0)
Immature Granulocytes: 1 %
Lymphocytes Relative: 17 %
Lymphs Abs: 0.8 10*3/uL (ref 0.7–4.0)
MCH: 30.4 pg (ref 26.0–34.0)
MCHC: 31.3 g/dL (ref 30.0–36.0)
MCV: 97.1 fL (ref 80.0–100.0)
Monocytes Absolute: 0.7 10*3/uL (ref 0.1–1.0)
Monocytes Relative: 14 %
Neutro Abs: 2.6 10*3/uL (ref 1.7–7.7)
Neutrophils Relative %: 54 %
Platelet Count: 240 10*3/uL (ref 150–400)
RBC: 3.13 MIL/uL — ABNORMAL LOW (ref 3.87–5.11)
RDW: 15 % (ref 11.5–15.5)
WBC Count: 4.8 10*3/uL (ref 4.0–10.5)
nRBC: 0 % (ref 0.0–0.2)

## 2019-05-25 LAB — IRON AND TIBC
Iron: 36 ug/dL — ABNORMAL LOW (ref 41–142)
Saturation Ratios: 15 % — ABNORMAL LOW (ref 21–57)
TIBC: 233 ug/dL — ABNORMAL LOW (ref 236–444)
UIBC: 197 ug/dL (ref 120–384)

## 2019-05-25 LAB — FERRITIN: Ferritin: 85 ng/mL (ref 11–307)

## 2019-05-25 MED ORDER — EPOETIN ALFA-EPBX 40000 UNIT/ML IJ SOLN
40000.0000 [IU] | Freq: Once | INTRAMUSCULAR | Status: AC
  Administered 2019-05-25: 40000 [IU] via SUBCUTANEOUS
  Filled 2019-05-25: qty 1

## 2019-05-25 NOTE — Patient Instructions (Signed)

## 2019-05-29 ENCOUNTER — Ambulatory Visit: Payer: Medicare Other

## 2019-05-29 ENCOUNTER — Other Ambulatory Visit: Payer: Medicare Other

## 2019-05-31 ENCOUNTER — Other Ambulatory Visit: Payer: Self-pay | Admitting: *Deleted

## 2019-05-31 ENCOUNTER — Other Ambulatory Visit: Payer: Self-pay | Admitting: Cardiology

## 2019-05-31 DIAGNOSIS — D638 Anemia in other chronic diseases classified elsewhere: Secondary | ICD-10-CM

## 2019-06-01 ENCOUNTER — Inpatient Hospital Stay: Payer: Medicare Other

## 2019-06-01 ENCOUNTER — Other Ambulatory Visit: Payer: Self-pay

## 2019-06-01 ENCOUNTER — Other Ambulatory Visit: Payer: Self-pay | Admitting: Family Medicine

## 2019-06-01 VITALS — BP 130/57 | HR 77 | Temp 98.3°F | Resp 18

## 2019-06-01 DIAGNOSIS — D638 Anemia in other chronic diseases classified elsewhere: Secondary | ICD-10-CM

## 2019-06-01 DIAGNOSIS — M353 Polymyalgia rheumatica: Secondary | ICD-10-CM | POA: Diagnosis not present

## 2019-06-01 LAB — CBC WITH DIFFERENTIAL (CANCER CENTER ONLY)
Abs Immature Granulocytes: 0.05 10*3/uL (ref 0.00–0.07)
Basophils Absolute: 0 10*3/uL (ref 0.0–0.1)
Basophils Relative: 1 %
Eosinophils Absolute: 0.7 10*3/uL — ABNORMAL HIGH (ref 0.0–0.5)
Eosinophils Relative: 14 %
HCT: 33.5 % — ABNORMAL LOW (ref 36.0–46.0)
Hemoglobin: 10.4 g/dL — ABNORMAL LOW (ref 12.0–15.0)
Immature Granulocytes: 1 %
Lymphocytes Relative: 19 %
Lymphs Abs: 0.9 10*3/uL (ref 0.7–4.0)
MCH: 29.8 pg (ref 26.0–34.0)
MCHC: 31 g/dL (ref 30.0–36.0)
MCV: 96 fL (ref 80.0–100.0)
Monocytes Absolute: 0.6 10*3/uL (ref 0.1–1.0)
Monocytes Relative: 12 %
Neutro Abs: 2.5 10*3/uL (ref 1.7–7.7)
Neutrophils Relative %: 53 %
Platelet Count: 244 10*3/uL (ref 150–400)
RBC: 3.49 MIL/uL — ABNORMAL LOW (ref 3.87–5.11)
RDW: 15.5 % (ref 11.5–15.5)
WBC Count: 4.7 10*3/uL (ref 4.0–10.5)
nRBC: 0 % (ref 0.0–0.2)

## 2019-06-01 LAB — IRON AND TIBC
Iron: 37 ug/dL — ABNORMAL LOW (ref 41–142)
Saturation Ratios: 16 % — ABNORMAL LOW (ref 21–57)
TIBC: 241 ug/dL (ref 236–444)
UIBC: 204 ug/dL (ref 120–384)

## 2019-06-01 LAB — FERRITIN: Ferritin: 90 ng/mL (ref 11–307)

## 2019-06-01 MED ORDER — EPOETIN ALFA-EPBX 40000 UNIT/ML IJ SOLN
40000.0000 [IU] | Freq: Once | INTRAMUSCULAR | Status: AC
  Administered 2019-06-01: 11:00:00 40000 [IU] via SUBCUTANEOUS
  Filled 2019-06-01: qty 1

## 2019-06-01 NOTE — Progress Notes (Signed)
Retacrit had already been drawn up and [name] [name] stated to waste it down here.

## 2019-06-01 NOTE — Progress Notes (Signed)
There was a conflict between the treatment plan and the note from Dr Lindi Adie, I called him for clarification and he realized that there was an issue with values. He stated that the patient wouldn't receive injection if higher than 10. Her labs today were 10.4. I made pharmacy aware because they had already verified based off of the hold if higher than 11 note. Wells Guiles from pharmacy stated she would add a note as well.

## 2019-06-05 ENCOUNTER — Other Ambulatory Visit: Payer: Medicare Other

## 2019-06-05 ENCOUNTER — Ambulatory Visit: Payer: Medicare Other

## 2019-06-07 ENCOUNTER — Other Ambulatory Visit: Payer: Self-pay

## 2019-06-07 DIAGNOSIS — D539 Nutritional anemia, unspecified: Secondary | ICD-10-CM

## 2019-06-08 ENCOUNTER — Inpatient Hospital Stay: Payer: Medicare Other

## 2019-06-08 ENCOUNTER — Other Ambulatory Visit: Payer: Self-pay

## 2019-06-08 DIAGNOSIS — D539 Nutritional anemia, unspecified: Secondary | ICD-10-CM

## 2019-06-08 DIAGNOSIS — M353 Polymyalgia rheumatica: Secondary | ICD-10-CM | POA: Diagnosis not present

## 2019-06-08 LAB — CBC WITH DIFFERENTIAL (CANCER CENTER ONLY)
Abs Immature Granulocytes: 0.03 10*3/uL (ref 0.00–0.07)
Basophils Absolute: 0 10*3/uL (ref 0.0–0.1)
Basophils Relative: 1 %
Eosinophils Absolute: 0.6 10*3/uL — ABNORMAL HIGH (ref 0.0–0.5)
Eosinophils Relative: 12 %
HCT: 33.1 % — ABNORMAL LOW (ref 36.0–46.0)
Hemoglobin: 10.1 g/dL — ABNORMAL LOW (ref 12.0–15.0)
Immature Granulocytes: 1 %
Lymphocytes Relative: 15 %
Lymphs Abs: 0.7 10*3/uL (ref 0.7–4.0)
MCH: 29.4 pg (ref 26.0–34.0)
MCHC: 30.5 g/dL (ref 30.0–36.0)
MCV: 96.5 fL (ref 80.0–100.0)
Monocytes Absolute: 0.5 10*3/uL (ref 0.1–1.0)
Monocytes Relative: 12 %
Neutro Abs: 2.8 10*3/uL (ref 1.7–7.7)
Neutrophils Relative %: 59 %
Platelet Count: 249 10*3/uL (ref 150–400)
RBC: 3.43 MIL/uL — ABNORMAL LOW (ref 3.87–5.11)
RDW: 15.2 % (ref 11.5–15.5)
WBC Count: 4.7 10*3/uL (ref 4.0–10.5)
nRBC: 0 % (ref 0.0–0.2)

## 2019-06-08 NOTE — Progress Notes (Signed)
Pt hgb 10.1 today no injection needed at this time

## 2019-06-12 ENCOUNTER — Other Ambulatory Visit: Payer: Medicare Other

## 2019-06-12 ENCOUNTER — Ambulatory Visit: Payer: Medicare Other

## 2019-06-14 ENCOUNTER — Other Ambulatory Visit: Payer: Self-pay | Admitting: *Deleted

## 2019-06-14 DIAGNOSIS — D638 Anemia in other chronic diseases classified elsewhere: Secondary | ICD-10-CM

## 2019-06-15 ENCOUNTER — Telehealth: Payer: Self-pay | Admitting: Hematology and Oncology

## 2019-06-15 ENCOUNTER — Inpatient Hospital Stay: Payer: Medicare Other

## 2019-06-15 ENCOUNTER — Other Ambulatory Visit: Payer: Self-pay | Admitting: Cardiology

## 2019-06-15 NOTE — Telephone Encounter (Signed)
Returned patient's phone call regarding rescheduling an appointment, per patient's request 10/22 appointment has moved to 10/23.

## 2019-06-16 ENCOUNTER — Other Ambulatory Visit: Payer: Medicare Other

## 2019-06-16 ENCOUNTER — Telehealth: Payer: Self-pay | Admitting: Hematology and Oncology

## 2019-06-16 ENCOUNTER — Ambulatory Visit (INDEPENDENT_AMBULATORY_CARE_PROVIDER_SITE_OTHER): Payer: Medicare Other | Admitting: Family Medicine

## 2019-06-16 ENCOUNTER — Encounter: Payer: Self-pay | Admitting: Family Medicine

## 2019-06-16 VITALS — Temp 98.3°F | Ht 64.5 in | Wt 165.0 lb

## 2019-06-16 DIAGNOSIS — J302 Other seasonal allergic rhinitis: Secondary | ICD-10-CM

## 2019-06-16 MED ORDER — FLUTICASONE PROPIONATE 50 MCG/ACT NA SUSP: 2.0000 | Freq: Every day | NASAL | 6 refills | Status: DC

## 2019-06-16 MED ORDER — CETIRIZINE HCL 10 MG PO TABS: 10.0000 mg | ORAL_TABLET | Freq: Every day | ORAL | 11 refills | Status: DC

## 2019-06-16 NOTE — Progress Notes (Signed)
Virtual Visit via Video Note  I connected with [name] [name] on 06/16/19 at 10:15 AM EDT by a video enabled telemedicine application and verified that I am speaking with the correct person using two identifiers.  The patient was unable to secure a connection on her mobile phone proceed with a video visit.  Visit was conducted with audio only.  Location: Patient: In her home. Provider: [place]   I discussed the limitations of evaluation and management by telemedicine and the availability of in person appointments. The patient expressed understanding and agreed to proceed.  History of Present Illness: Chief Complaint  Patient presents with  . Cough    Pt c/o cough which is constant, worse with seasonal allergies. Pt having runny nose. Pt states that she feels terrible. Denies chest or head congestion. Pt notes some SOB since her stent was placed [name] 2018  Hasn't felt well in over a week, more fatigued. "I haven't felt well in ages." She has had several losses recently. Cough over a week, "not so awfully bad, more aggravating."  Not interfering with sleep. Clear nasal drainage, some post nasal drainage. Feels nauseated about half of the time which is new. No fever. No wheeze, no worsening of SOB. No ear pain or pressure. Headache on and off at back of head. Prior to this week, she went into work 3-4 hours a day and feels like she does better. No worsening in body aches. History of fall allergies.  Has not taken any medication for symptoms. 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 Always wears mask.   Has an appointment with hematology upcoming. Has had "2 shots," with improvement of hgb.    Past Medical History:  Diagnosis Date  . Anemia    of chronic disease  . Anxiety   . Arthritis    "psoratic arthritis; new dx" (09/11/2016)  . CAD S/P percutaneous coronary angioplasty 08/2016   mLCx 90% -> 0% (2.75 mm x 16 mm) Synergy DES PCI Ostial D1 70% (not PTCA target). Moderate  ostial and mD2.  LAD tandem 50% stenoses w/ negative FFR (0.86)  . Cancer ([place])    skin cancer on leg  . Chronic lower back pain   . Complication of anesthesia 1980 and 1982   trouble waking up after breast biopsy, many surgeries since no problem  . Depression   . Dyspnea on effort - CHRONIC    partial relief of Sx post PCI of L Circumflex.  . Fibrocystic breast   . GERD (gastroesophageal reflux disease)   . Headache    "monthly" (09/11/2016)  . History of blood transfusion    "several since age 74; I've had them after most of my surgeries" (09/11/2016)  . History of myocardial infarction    "doctor said there are signs I've had a heart attack; don't know when"  - Myoview [name] 2018 indicated small Inferior - Inferoseptal Infarct (vs. rest ischemia). Echo with inferoseptal hypokinesis.  Marland Kitchen Hx of skin cancer, basal cell 2013 and 2012   right inner, lower leg; several scattered around my body  . Hypertension   . Iron deficiency anemia    "I've had an iron infusion"  . Osteoarthritis   . Osteopenia   . Peptic ulcer disease    H pylori  . PMR (polymyalgia rheumatica) (HCC)   . PONV (postoperative nausea and vomiting)   . Psoriasis    Past Surgical History:  Procedure Laterality Date  . APPENDECTOMY    .  BACK SURGERY     x 6   . BREAST BIOPSY Left 1980 and 1982   "benign"  . CARDIAC CATHETERIZATION N/A 09/11/2016   Procedure: Left Heart Cath and Coronary Angiography;  Surgeon: Leonie Man, MD;  Location: [facility];  Service: Cardiovascular: mLCx 90% * (PCI). Ostial D1 70% (not PTCA target). Mod ostial and mD2.  LAD tandem 50% stenoses w/ negative FFR (0.86)  . CARDIAC CATHETERIZATION N/A 09/11/2016   Procedure: Intravascular Pressure Wire/FFR Study;  Surgeon: Leonie Man, MD;  Location: [facility];  Service: Cardiovascular: --  LAD tandem 50% stenoses w/ negative FFR (0.86  . CARDIAC CATHETERIZATION N/A 09/11/2016   Procedure: Coronary Stent Intervention;   Surgeon: Leonie Man, MD;  Location: [facility];  Service: Cardiovascular: -- DES PCI mLCx 90%--0% SYNERGY DES 2.75 MM X 16 MM.  Marland Kitchen CARDIAC CATHETERIZATION  2001   non-obstructive CAD  . CATARACT EXTRACTION W/ INTRAOCULAR LENS  IMPLANT, BILATERAL  July -  August 2017  . DILATION AND CURETTAGE OF UTERUS  X 3  . ESOPHAGOGASTRODUODENOSCOPY  03/2009   ulcer,HP, GERD stricture  . EXCISIONAL HEMORRHOIDECTOMY    . HIP ARTHROPLASTY Left 2010  . JOINT REPLACEMENT    . KNEE ARTHROSCOPY Bilateral   . LAPAROSCOPIC CHOLECYSTECTOMY  2005  . LUMBAR DISC SURGERY  11/1984; 1987; 1989; 2004; 2005 X 2   deg. disk lumber spine   . MOHS SURGERY Right    "inside my lower leg"  . NASAL SINUS SURGERY  1970s  . NM MYOVIEW LTD  08/28/2016   INTERMEDIATE RISK (b/c reduced EF & ? small infarct) -- EF ~35-40%.  ? Prior Small Inferior-apical & septal infarct (w/o ischemia).  Difuse HK - worse in inferoapex.  Suggest prior infarct with no ischemia. Ischemic area correlates with echo WMA  . REVERSE SHOULDER ARTHROPLASTY Left 12/17/2017   Procedure: LEFT REVERSE SHOULDER ARTHROPLASTY;  Surgeon: Netta Cedars, MD;  Location: [facility];  Service: Orthopedics;  Laterality: Left;  . TOTAL HIP ARTHROPLASTY Right 04/04/2013   Procedure: RIGHT TOTAL HIP ARTHROPLASTY ANTERIOR APPROACH;  Surgeon: Mauri Pole, MD;  Location: [facility];  Service: Orthopedics;  Laterality: Right;  . TOTAL KNEE ARTHROPLASTY Right 2009  . TRANSTHORACIC ECHOCARDIOGRAM  08/2016   mild conc LVH. EF 45-50% - anteroseptal & inferoseptal HK. Mod AI.  Marland Kitchen TRANSTHORACIC ECHOCARDIOGRAM  08/2017   (post PCI) --> EF normalized:  Normal LV systolic function; mild diastolic dysfunction; mild   LVH; sclerotic aortic valve with mild AI.  No RWMA  . TUBAL LIGATION    . VAGINAL HYSTERECTOMY  1970s   partial    Family History  Problem Relation Age of Onset  . Arthritis Mother   . Emphysema Mother   . Hyperlipidemia Mother   . Hypertension Mother   . Heart  disease Mother   . Heart disease Brother        CAD  . Diabetes Paternal Grandmother   . Heart disease Maternal Aunt    Social History   Tobacco Use  . Smoking status: Never Smoker  . Smokeless tobacco: Never Used  Substance Use Topics  . Alcohol use: Yes    Alcohol/week: 0.0 standard drinks    Comment: 09/11/2016 "might have a margarita q 3-4 months"  . Drug use: No      Observations/Objective: The patient is alert and answers questions appropriately. She is able to converse in complete sentences and does not sound short of breath.  No audible wheeze or cough.  Temp 98.3 F (36.8 C) (Oral)   Ht 5' 4.5" (1.638 m)   Wt 165 lb (74.8 kg)   BMI 27.88 kg/m   Assessment and Plan: 1. Seasonal allergic rhinitis, unspecified trigger - reviewed RTC/ER precautions with patient- SOB, fever, no improvement in 3-4 days - fluticasone (FLONASE) 50 MCG/ACT nasal spray; Place 2 sprays into both nostrils daily.  Dispense: 16 g; Refill: 6 - cetirizine (ZYRTEC) 10 MG tablet; Take 1 tablet (10 mg total) by mouth daily.  Dispense: 30 tablet; Refill: 11  - telephone visit lasted 11 minutes 40 seconds  Clarene Reamer, FNP-BC  [facility], [place]  06/16/2019 10:51 AM   Follow Up Instructions:    I discussed the assessment and treatment plan with the patient. The patient was provided an opportunity to ask questions and all were answered. The patient agreed with the plan and demonstrated an understanding of the instructions.   The patient was advised to call back or seek an in-person evaluation if the symptoms worsen or if the condition fails to improve as anticipated.   Elby Beck, FNP

## 2019-06-16 NOTE — Telephone Encounter (Signed)
Returned patient's phone call regarding rescheduling missed 10/23 appointment, per patient's request appointment moved to 10/29.

## 2019-06-18 ENCOUNTER — Other Ambulatory Visit: Payer: Self-pay

## 2019-06-18 ENCOUNTER — Emergency Department (HOSPITAL_COMMUNITY): Payer: Medicare Other

## 2019-06-18 ENCOUNTER — Inpatient Hospital Stay (HOSPITAL_COMMUNITY)
Admission: EM | Admit: 2019-06-18 | Discharge: 2019-06-21 | DRG: 871 | Disposition: A | Discharge status: Home Health | Payer: Medicare Other | Attending: Internal Medicine | Admitting: Internal Medicine

## 2019-06-18 DIAGNOSIS — N6019 Diffuse cystic mastopathy of unspecified breast: Secondary | ICD-10-CM | POA: Diagnosis present

## 2019-06-18 DIAGNOSIS — Z85828 Personal history of other malignant neoplasm of skin: Secondary | ICD-10-CM

## 2019-06-18 DIAGNOSIS — E869 Volume depletion, unspecified: Secondary | ICD-10-CM | POA: Diagnosis present

## 2019-06-18 DIAGNOSIS — M1990 Unspecified osteoarthritis, unspecified site: Secondary | ICD-10-CM | POA: Diagnosis present

## 2019-06-18 DIAGNOSIS — I2510 Atherosclerotic heart disease of native coronary artery without angina pectoris: Secondary | ICD-10-CM | POA: Diagnosis present

## 2019-06-18 DIAGNOSIS — Z880 Allergy status to penicillin: Secondary | ICD-10-CM

## 2019-06-18 DIAGNOSIS — Z8349 Family history of other endocrine, nutritional and metabolic diseases: Secondary | ICD-10-CM

## 2019-06-18 DIAGNOSIS — Z20822 Contact with and (suspected) exposure to covid-19: Secondary | ICD-10-CM

## 2019-06-18 DIAGNOSIS — Z955 Presence of coronary angioplasty implant and graft: Secondary | ICD-10-CM

## 2019-06-18 DIAGNOSIS — R509 Fever, unspecified: Secondary | ICD-10-CM | POA: Diagnosis not present

## 2019-06-18 DIAGNOSIS — I10 Essential (primary) hypertension: Secondary | ICD-10-CM | POA: Diagnosis present

## 2019-06-18 DIAGNOSIS — A419 Sepsis, unspecified organism: Principal | ICD-10-CM | POA: Diagnosis present

## 2019-06-18 DIAGNOSIS — J9601 Acute respiratory failure with hypoxia: Secondary | ICD-10-CM | POA: Diagnosis present

## 2019-06-18 DIAGNOSIS — I252 Old myocardial infarction: Secondary | ICD-10-CM

## 2019-06-18 DIAGNOSIS — Z888 Allergy status to other drugs, medicaments and biological substances status: Secondary | ICD-10-CM

## 2019-06-18 DIAGNOSIS — Z8711 Personal history of peptic ulcer disease: Secondary | ICD-10-CM

## 2019-06-18 DIAGNOSIS — Z79899 Other long term (current) drug therapy: Secondary | ICD-10-CM

## 2019-06-18 DIAGNOSIS — M353 Polymyalgia rheumatica: Secondary | ICD-10-CM | POA: Diagnosis present

## 2019-06-18 DIAGNOSIS — E785 Hyperlipidemia, unspecified: Secondary | ICD-10-CM | POA: Diagnosis present

## 2019-06-18 DIAGNOSIS — Z882 Allergy status to sulfonamides status: Secondary | ICD-10-CM

## 2019-06-18 DIAGNOSIS — E876 Hypokalemia: Secondary | ICD-10-CM | POA: Diagnosis present

## 2019-06-18 DIAGNOSIS — L4050 Arthropathic psoriasis, unspecified: Secondary | ICD-10-CM | POA: Diagnosis present

## 2019-06-18 DIAGNOSIS — Z66 Do not resuscitate: Secondary | ICD-10-CM | POA: Diagnosis present

## 2019-06-18 DIAGNOSIS — Z8261 Family history of arthritis: Secondary | ICD-10-CM

## 2019-06-18 DIAGNOSIS — J181 Lobar pneumonia, unspecified organism: Secondary | ICD-10-CM | POA: Diagnosis present

## 2019-06-18 DIAGNOSIS — Z79890 Hormone replacement therapy (postmenopausal): Secondary | ICD-10-CM

## 2019-06-18 DIAGNOSIS — Z20828 Contact with and (suspected) exposure to other viral communicable diseases: Secondary | ICD-10-CM | POA: Diagnosis present

## 2019-06-18 DIAGNOSIS — Z7902 Long term (current) use of antithrombotics/antiplatelets: Secondary | ICD-10-CM

## 2019-06-18 DIAGNOSIS — Z8249 Family history of ischemic heart disease and other diseases of the circulatory system: Secondary | ICD-10-CM

## 2019-06-18 DIAGNOSIS — K219 Gastro-esophageal reflux disease without esophagitis: Secondary | ICD-10-CM | POA: Diagnosis present

## 2019-06-18 DIAGNOSIS — N390 Urinary tract infection, site not specified: Secondary | ICD-10-CM | POA: Diagnosis present

## 2019-06-18 DIAGNOSIS — E871 Hypo-osmolality and hyponatremia: Secondary | ICD-10-CM | POA: Diagnosis present

## 2019-06-18 DIAGNOSIS — M8580 Other specified disorders of bone density and structure, unspecified site: Secondary | ICD-10-CM | POA: Diagnosis present

## 2019-06-18 LAB — COMPREHENSIVE METABOLIC PANEL
ALT: 11 U/L (ref 0–44)
AST: 26 U/L (ref 15–41)
Albumin: 2.7 g/dL — ABNORMAL LOW (ref 3.5–5.0)
Alkaline Phosphatase: 52 U/L (ref 38–126)
Anion gap: 15 (ref 5–15)
BUN: 20 mg/dL (ref 8–23)
CO2: 20 mmol/L — ABNORMAL LOW (ref 22–32)
Calcium: 8.2 mg/dL — ABNORMAL LOW (ref 8.9–10.3)
Chloride: 94 mmol/L — ABNORMAL LOW (ref 98–111)
Creatinine, Ser: 1.29 mg/dL — ABNORMAL HIGH (ref 0.44–1.00)
Glucose, Bld: 111 mg/dL — ABNORMAL HIGH (ref 70–99)
Potassium: 3.2 mmol/L — ABNORMAL LOW (ref 3.5–5.1)
Sodium: 129 mmol/L — ABNORMAL LOW (ref 135–145)
Total Bilirubin: 0.7 mg/dL (ref 0.3–1.2)
Total Protein: 5.6 g/dL — ABNORMAL LOW (ref 6.5–8.1)

## 2019-06-18 LAB — URINALYSIS, ROUTINE W REFLEX MICROSCOPIC
Bilirubin Urine: NEGATIVE
Glucose, UA: NEGATIVE mg/dL
Hgb urine dipstick: NEGATIVE
Ketones, ur: NEGATIVE mg/dL
Nitrite: NEGATIVE
Protein, ur: NEGATIVE mg/dL
Specific Gravity, Urine: 1.018 (ref 1.005–1.030)
pH: 5 (ref 5.0–8.0)

## 2019-06-18 LAB — CBC WITH DIFFERENTIAL/PLATELET
Abs Immature Granulocytes: 0.05 10*3/uL (ref 0.00–0.07)
Basophils Absolute: 0.1 10*3/uL (ref 0.0–0.1)
Basophils Relative: 1 %
Eosinophils Absolute: 0.2 10*3/uL (ref 0.0–0.5)
Eosinophils Relative: 2 %
HCT: 30.4 % — ABNORMAL LOW (ref 36.0–46.0)
Hemoglobin: 9.7 g/dL — ABNORMAL LOW (ref 12.0–15.0)
Immature Granulocytes: 1 %
Lymphocytes Relative: 10 %
Lymphs Abs: 0.9 10*3/uL (ref 0.7–4.0)
MCH: 29.5 pg (ref 26.0–34.0)
MCHC: 31.9 g/dL (ref 30.0–36.0)
MCV: 92.4 fL (ref 80.0–100.0)
Monocytes Absolute: 0.9 10*3/uL (ref 0.1–1.0)
Monocytes Relative: 10 %
Neutro Abs: 6.4 10*3/uL (ref 1.7–7.7)
Neutrophils Relative %: 76 %
Platelets: 242 10*3/uL (ref 150–400)
RBC: 3.29 MIL/uL — ABNORMAL LOW (ref 3.87–5.11)
RDW: 14.6 % (ref 11.5–15.5)
WBC: 8.4 10*3/uL (ref 4.0–10.5)
nRBC: 0 % (ref 0.0–0.2)

## 2019-06-18 LAB — LACTATE DEHYDROGENASE: LDH: 269 U/L — ABNORMAL HIGH (ref 98–192)

## 2019-06-18 LAB — D-DIMER, QUANTITATIVE: D-Dimer, Quant: 1.91 ug/mL-FEU — ABNORMAL HIGH (ref 0.00–0.50)

## 2019-06-18 LAB — PROTIME-INR
INR: 1.1 (ref 0.8–1.2)
Prothrombin Time: 13.6 seconds (ref 11.4–15.2)

## 2019-06-18 LAB — LACTIC ACID, PLASMA: Lactic Acid, Venous: 1.2 mmol/L (ref 0.5–1.9)

## 2019-06-18 LAB — FIBRINOGEN: Fibrinogen: 496 mg/dL — ABNORMAL HIGH (ref 210–475)

## 2019-06-18 LAB — TRIGLYCERIDES: Triglycerides: 131 mg/dL (ref <150)

## 2019-06-18 MED ORDER — SODIUM CHLORIDE 0.9 % IV SOLN
2.0000 g | INTRAVENOUS | Status: DC
  Administered 2019-06-18 – 2019-06-20 (×3): 2 g via INTRAVENOUS
  Filled 2019-06-18 (×2): qty 2
  Filled 2019-06-18 (×3): qty 20

## 2019-06-18 MED ORDER — SODIUM CHLORIDE 0.9 % IV SOLN
500.0000 mg | INTRAVENOUS | Status: DC
  Administered 2019-06-18 – 2019-06-19 (×2): 500 mg via INTRAVENOUS
  Filled 2019-06-18 (×4): qty 500

## 2019-06-18 MED ORDER — SODIUM CHLORIDE 0.9 % IV BOLUS
1000.0000 mL | Freq: Once | INTRAVENOUS | Status: AC
  Administered 2019-06-18: 1000 mL via INTRAVENOUS

## 2019-06-18 MED ORDER — POTASSIUM CHLORIDE CRYS ER 20 MEQ PO TBCR
40.0000 meq | EXTENDED_RELEASE_TABLET | Freq: Once | ORAL | Status: AC
  Administered 2019-06-19: 40 meq via ORAL
  Filled 2019-06-18: qty 2

## 2019-06-18 MED ORDER — ACETAMINOPHEN 325 MG PO TABS
650.0000 mg | ORAL_TABLET | Freq: Once | ORAL | Status: AC
  Administered 2019-06-18: 650 mg via ORAL
  Filled 2019-06-18: qty 2

## 2019-06-18 NOTE — ED Provider Notes (Signed)
[facility] EMERGENCY DEPARTMENT Provider Note   CSN: [id number] Arrival date & time: 06/18/19  2127     History   Chief Complaint Chief Complaint  Patient presents with   Code Sepsis    HPI [name] [name] is a 78 y.o. female.     HPI  78 year old female brought in by EMS for fever and not feeling well.  Has not been feeling well for about a week.  Decreased p.o. intake.  No vomiting although she was gagging a little at home.  The patient has been having a cough for about a week.  A little bit of shortness of breath.  She frequently gets a infection like this every year at this time.  No dysuria.  Has chronic aches in her back, neck, joints.  These are not new. EMS called because she was not quite acting like herself.  When the daughter saw her she did seem to be acting okay but they wanted to get checked out.  Found to be febrile and came into the ER.  Past Medical History:  Diagnosis Date   Anemia    of chronic disease   Anxiety    Arthritis    "psoratic arthritis; new dx" (09/11/2016)   CAD S/P percutaneous coronary angioplasty 08/2016   mLCx 90% -> 0% (2.75 mm x 16 mm) Synergy DES PCI Ostial D1 70% (not PTCA target). Moderate ostial and mD2.  LAD tandem 50% stenoses w/ negative FFR (0.86)   Cancer (HCC)    skin cancer on leg   Chronic lower back pain    Complication of anesthesia 1980 and 1982   trouble waking up after breast biopsy, many surgeries since no problem   Depression    Dyspnea on effort - CHRONIC    partial relief of Sx post PCI of L Circumflex.   Fibrocystic breast    GERD (gastroesophageal reflux disease)    Headache    "monthly" (09/11/2016)   History of blood transfusion    "several since age 19; I've had them after most of my surgeries" (09/11/2016)   History of myocardial infarction    "doctor said there are signs I've had a heart attack; don't know when"  - Myoview [name] 2018 indicated small Inferior - Inferoseptal  Infarct (vs. rest ischemia). Echo with inferoseptal hypokinesis.   Hx of skin cancer, basal cell 2013 and 2012   right inner, lower leg; several scattered around my body   Hypertension    Iron deficiency anemia    "I've had an iron infusion"   Osteoarthritis    Osteopenia    Peptic ulcer disease    H pylori   PMR (polymyalgia rheumatica) (HCC)    PONV (postoperative nausea and vomiting)    Psoriasis     Patient Active Problem List   Diagnosis Date Noted   Sepsis ([place]) 06/19/2019   Pedal edema 10/04/2018   Grief reaction 09/17/2018   S/P shoulder replacement, left 12/17/2017   Routine general medical examination at a health care facility 10/14/2017   Preop cardiovascular exam 10/01/2017   CAD S/P percutaneous coronary angioplasty 01/27/2017   Aortic regurgitation 01/27/2017   Dyspnea on exertion 08/19/2016   Left bundle branch block (LBBB) on electrocardiogram 08/19/2016   Estrogen deficiency 10/08/2015   Chronic fatigue 08/20/2015   Deficiency anemia 11/17/2013   Encounter for Medicare annual wellness exam 07/17/2013   PMR (polymyalgia rheumatica) ([place]) 07/17/2013   Intertrigo 04/19/2013   Expected blood loss anemia 04/05/2013  Overweight (BMI 25.0-29.9) 04/05/2013   S/P right THA, AA 04/04/2013   IBS (irritable bowel syndrome) 05/30/2012   Anemia of chronic disease 04/16/2010   Generalized anxiety disorder 04/16/2010   GERD 03/04/2009   Vitamin D deficiency 03/12/2008   URINARY TRACT INFECTION, RECURRENT 08/07/2007   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   FIBROCYSTIC BREAST DISEASE, HX OF 02/11/2007   Hyperlipidemia with low HDL 01/10/2007   DEPRESSION 01/10/2007   RETINAL VEIN OCCLUSION 01/10/2007   Essential hypertension 01/10/2007   PEPTIC ULCER DISEASE 01/10/2007   Shoulder arthritis 01/10/2007   SKIN CANCER, HX OF 01/10/2007    Past Surgical History:  Procedure Laterality Date   APPENDECTOMY     BACK SURGERY      x 6    BREAST BIOPSY Left 1980 and 1982   "benign"   CARDIAC CATHETERIZATION N/A 09/11/2016   Procedure: Left Heart Cath and Coronary Angiography;  Surgeon: Leonie Man, MD;  Location: [facility];  Service: Cardiovascular: mLCx 90% * (PCI). Ostial D1 70% (not PTCA target). Mod ostial and mD2.  LAD tandem 50% stenoses w/ negative FFR (0.86)   CARDIAC CATHETERIZATION N/A 09/11/2016   Procedure: Intravascular Pressure Wire/FFR Study;  Surgeon: Leonie Man, MD;  Location: [facility] [facility];  Service: Cardiovascular: --  LAD tandem 50% stenoses w/ negative FFR (0.86   CARDIAC CATHETERIZATION N/A 09/11/2016   Procedure: Coronary Stent Intervention;  Surgeon: Leonie Man, MD;  Location: [facility];  Service: Cardiovascular: -- DES PCI mLCx 90%--0% SYNERGY DES 2.75 MM X 16 MM.   CARDIAC CATHETERIZATION  2001   non-obstructive CAD   CATARACT EXTRACTION W/ INTRAOCULAR LENS  IMPLANT, BILATERAL  July -  August 2017   DILATION AND CURETTAGE OF UTERUS  X 3   ESOPHAGOGASTRODUODENOSCOPY  03/2009   ulcer,HP, GERD stricture   EXCISIONAL HEMORRHOIDECTOMY     HIP ARTHROPLASTY Left 2010   JOINT REPLACEMENT     KNEE ARTHROSCOPY Bilateral    LAPAROSCOPIC CHOLECYSTECTOMY  2005   LUMBAR [place] SURGERY  11/1984; 1987; 1989; 2004; 2005 X 2   deg. disk lumber spine    MOHS SURGERY Right    "inside my lower leg"   NASAL SINUS SURGERY  1970s   NM MYOVIEW LTD  08/28/2016   INTERMEDIATE RISK (b/c reduced EF & ? small infarct) -- EF ~35-40%.  ? Prior Small Inferior-apical & septal infarct (w/o ischemia).  Difuse HK - worse in inferoapex.  Suggest prior infarct with no ischemia. Ischemic area correlates with echo WMA   REVERSE SHOULDER ARTHROPLASTY Left 12/17/2017   Procedure: LEFT REVERSE SHOULDER ARTHROPLASTY;  Surgeon: Netta Cedars, MD;  Location: [facility];  Service: Orthopedics;  Laterality: Left;   TOTAL HIP ARTHROPLASTY Right 04/04/2013   Procedure: RIGHT TOTAL HIP  ARTHROPLASTY ANTERIOR APPROACH;  Surgeon: Mauri Pole, MD;  [facility]: [facility];  Service: Orthopedics;  Laterality: Right;   TOTAL KNEE ARTHROPLASTY Right 2009   TRANSTHORACIC ECHOCARDIOGRAM  08/2016   mild conc LVH. EF 45-50% - anteroseptal & inferoseptal HK. Mod AI.   TRANSTHORACIC ECHOCARDIOGRAM  08/2017   (post PCI) --> EF normalized:  Normal LV systolic function; mild diastolic dysfunction; mild   LVH; sclerotic aortic valve with mild AI.  No RWMA   TUBAL LIGATION     VAGINAL HYSTERECTOMY  1970s   partial      OB History   No obstetric history on file.      Home Medications    Prior to Admission  medications   Medication Sig Start Date End Date Taking? Authorizing Provider  acetaminophen (TYLENOL) 500 MG tablet Take 1,000 mg by mouth 2 (two) times daily as needed for moderate pain or headache.     [provider]  atorvastatin (LIPITOR) 20 MG tablet TAKE 1 TABLET BY MOUTH EVERY DAY 04/17/19   Leonie Man, MD  busPIRone (BUSPAR) 15 MG tablet TAKE 1/2 TO 1 TABLET (7.5 TO 15 MG TOTAL) BY MOUTH 2 TIMES DAILY. 06/01/19   Tower, [name], MD  cetirizine (ZYRTEC) 10 MG tablet Take 1 tablet (10 mg total) by mouth daily. 06/16/19   Elby Beck, FNP  clopidogrel (PLAVIX) 75 MG tablet Take 1 tablet (75 mg total) by mouth daily with breakfast. 11/04/18   Leonie Man, MD  colestipol (COLESTID) 1 g tablet Take 1 tablet (1 g total) by mouth 2 (two) times daily. 05/03/19   Mauri Pole, MD  fluticasone (FLONASE) 50 MCG/ACT nasal spray Place 2 sprays into both nostrils daily. 06/16/19   Elby Beck, FNP  folic acid (FOLVITE) 1 MG tablet Take 2 mg by mouth daily.     [provider]  HYDROcodone-acetaminophen (NORCO/VICODIN) 5-325 MG tablet Take 1-2 tablets by mouth every 4 (four) hours as needed for moderate pain or severe pain. 12/17/17   Netta Cedars, MD  isosorbide mononitrate (IMDUR) 60 MG 24 hr tablet TAKE 1 TABLET BY MOUTH EVERY DAY 08/29/18    Leonie Man, MD  losartan-hydrochlorothiazide [facility]) 50-12.5 MG tablet TAKE 1 TABLET BY MOUTH EVERY DAY 06/15/19   Leonie Man, MD  Melatonin 3 MG CAPS Take 9 mg by mouth at bedtime.     [provider]  methotrexate (RHEUMATREX) 2.5 MG tablet Take 6 tablets (15 mg total) by mouth once a week. (7 tablets) 04/11/19   Nicholas Lose, MD  metoprolol tartrate (LOPRESSOR) 25 MG tablet TAKE 1 TABLET BY MOUTH TWICE A DAY 05/31/19   Leonie Man, MD  nitroGLYCERIN (NITROSTAT) 0.4 MG SL tablet Place 1 tablet (0.4 mg total) under the tongue every 5 (five) minutes as needed for chest pain. 09/08/16 02/21/19  Lyda Jester M, PA-C  pantoprazole (PROTONIX) 40 MG tablet Take 1 tablet (40 mg total) by mouth daily. 05/23/19   Leonie Man, MD  raloxifene (EVISTA) 60 MG tablet Take 1 tablet (60 mg total) by mouth daily. 10/25/18   Tower, [name], MD  Vitamin D, Cholecalciferol, 25 MCG (1000 UT) CAPS Take 1 capsule by mouth daily.    [provider]    Family History Family History  Problem Relation Age of Onset   Arthritis Mother    Emphysema Mother    Hyperlipidemia Mother    Hypertension Mother    Heart disease Mother    Heart disease Brother        CAD   Diabetes Paternal Grandmother    Heart disease Maternal Aunt     Social History Social History   Tobacco Use   Smoking status: Never Smoker   Smokeless tobacco: Never Used  Substance Use Topics   Alcohol use: Yes    Alcohol/week: 0.0 standard drinks    Comment: 09/11/2016 "might have a margarita q 3-4 months"   Drug use: No     Allergies   Sulfonamide derivatives, Ace inhibitors, Oxaprozin, Pregabalin, and Amoxicillin-pot clavulanate   Review of Systems Review of Systems  Constitutional: Positive for fever.  Respiratory: Positive for cough and shortness of breath.   Cardiovascular: Negative  for chest pain.  Gastrointestinal: Negative for abdominal pain and vomiting.  Genitourinary:  Negative for dysuria.  Neurological: Negative for headaches.  All other systems reviewed and are negative.    Physical Exam Updated Vital Signs BP (!) 119/52    Pulse 96    Temp (!) 101.1 F (38.4 C)    Resp 18    SpO2 97%   Physical Exam Vitals signs and nursing note reviewed.  Constitutional:      General: She is not in acute distress.    Appearance: She is well-developed. She is not ill-appearing or diaphoretic.     Comments: Awake, alert, no distress O2 sat 86% room air  HENT:     Head: Normocephalic and atraumatic.     Right Ear: External ear normal.     Left Ear: External ear normal.     Nose: Nose normal.  Eyes:     General:        Right eye: No discharge.        Left eye: No discharge.  Cardiovascular:     Rate and Rhythm: Regular rhythm. Tachycardia present.     Heart sounds: Normal heart sounds.  Pulmonary:     Effort: Pulmonary effort is normal. No respiratory distress.     Breath sounds: Normal breath sounds. No wheezing, rhonchi or rales.  Abdominal:     General: There is no distension.     Palpations: Abdomen is soft.     Tenderness: There is no abdominal tenderness.  Skin:    General: Skin is warm and dry.  Neurological:     Mental Status: She is alert.  Psychiatric:        Mood and Affect: Mood is not anxious.      ED Treatments / Results  Labs (all labs ordered are listed, but only abnormal results are displayed) Labs Reviewed  COMPREHENSIVE METABOLIC PANEL - Abnormal; Notable for the following components:      Result Value   Sodium 129 (*)    Potassium 3.2 (*)    Chloride 94 (*)    CO2 20 (*)    Glucose, Bld 111 (*)    Creatinine, Ser 1.29 (*)    Calcium 8.2 (*)    Total Protein 5.6 (*)    Albumin 2.7 (*)    All other components within normal limits  CBC WITH DIFFERENTIAL/PLATELET - Abnormal; Notable for the following components:   RBC 3.29 (*)    Hemoglobin 9.7 (*)    HCT 30.4 (*)    All other components within normal limits   URINALYSIS, ROUTINE W REFLEX MICROSCOPIC - Abnormal; Notable for the following components:   APPearance HAZY (*)    Leukocytes,Ua MODERATE (*)    Bacteria, UA MANY (*)    All other components within normal limits  D-DIMER, QUANTITATIVE (NOT AT [facility]) - Abnormal; Notable for the following components:   D-Dimer, Quant 1.91 (*)    All other components within normal limits  LACTATE DEHYDROGENASE - Abnormal; Notable for the following components:   LDH 269 (*)    All other components within normal limits  FIBRINOGEN - Abnormal; Notable for the following components:   Fibrinogen 496 (*)    All other components within normal limits  CULTURE, BLOOD (ROUTINE X 2)  CULTURE, BLOOD (ROUTINE X 2)  URINE CULTURE  SARS CORONAVIRUS 2 (TAT 6-24 HRS)  LACTIC ACID, PLASMA  PROTIME-INR  TRIGLYCERIDES  LACTIC ACID, PLASMA  PROCALCITONIN  C-REACTIVE PROTEIN  FERRITIN  EKG None  Radiology Dg Chest Portable 1 View  Result Date: 06/18/2019 CLINICAL DATA:  Fever EXAM: PORTABLE CHEST 1 VIEW COMPARISON:  Radiograph 08/19/2016 FINDINGS: Streaky basilar atelectasis. No consolidation, features of edema, pneumothorax, or effusion. Pulmonary vascularity is normally distributed. The aorta is calcified. The remaining cardiomediastinal contours are unremarkable. No acute osseous or soft tissue abnormality. Left reverse shoulder arthroplasty. Degenerative changes are present in the imaged spine and right shoulder. IMPRESSION: Streaky basilar atelectasis.  No consolidative process. Aortic Atherosclerosis (ICD10-I70.0). Electronically Signed   By: Lovena Le M.D.   On: 06/18/2019 22:21    Procedures Procedures (including critical care time)  Medications Ordered in ED Medications  cefTRIAXone (ROCEPHIN) 2 g in sodium chloride 0.9 % 100 mL IVPB (0 g Intravenous Stopped 06/18/19 2340)  azithromycin (ZITHROMAX) 500 mg in sodium chloride 0.9 % 250 mL IVPB (0 mg Intravenous Stopped 06/19/19 0018)  sodium chloride  0.9 % bolus 1,000 mL (0 mLs Intravenous Stopped 06/19/19 0019)  acetaminophen (TYLENOL) tablet 650 mg (650 mg Oral Given 06/18/19 2301)  potassium chloride SA (KLOR-CON) CR tablet 40 mEq (40 mEq Oral Given 06/19/19 0012)     Initial Impression / Assessment and Plan / ED Course  I have reviewed the triage vital signs and the nursing notes.  Pertinent labs & imaging results that were available during my care of the patient were reviewed by me and considered in my medical decision making (see chart for details).        Patient is noted to be mildly hypoxic on arrival and was placed on 2 L.  Given Tylenol.  She does have respiratory symptoms though often gets these every year.  She does have a urinary tract infection on urinalysis.  While she is not septic, given the oxygen requirement I think she will need admission.  Certainly infection with COVID-19 is a possibility and this testing has been sent to though is pending.  Dr. Marlowe Sax to admit.  ELIYANAH ELGERSMA was evaluated in Emergency Department on 06/19/2019 for the symptoms described in the history of present illness. She was evaluated in the context of the global COVID-19 pandemic, which necessitated consideration that the patient might be at risk for infection with the SARS-CoV-2 virus that causes COVID-19. Institutional protocols and algorithms that pertain to the evaluation of patients at risk for COVID-19 are in a state of rapid change based on information released by regulatory bodies including the CDC and federal and state organizations. These policies and algorithms were followed during the patient's care in the ED.   Final Clinical Impressions(s) / ED Diagnoses   Final diagnoses:  Fever in adult    ED Discharge Orders    None       Sherwood Gambler, MD 06/19/19 Benancio Deeds

## 2019-06-18 NOTE — ED Notes (Signed)
Second set of blood cultures not collected due to antibiotics being started.

## 2019-06-18 NOTE — ED Triage Notes (Signed)
Pt from home c/o fatigue and not feeling well for the past week, worse in the past two days. Pt contacted PCP who prescribed sinus medication. A&Ox3 with intermittent confusion. Has had NV, bodyaches, chills, cough and fever. Took Tylenol this morning, emesis enroute, EMS unable to give tyelnol enroute. Given 4 mg zofran and 317ml NS. CBG 124, tachycardic, tachypneic. Daughter already at bedside; pt denies exposure to Covid but has been working. C/o neck and back pain that are worse than baseline

## 2019-06-19 ENCOUNTER — Inpatient Hospital Stay (HOSPITAL_COMMUNITY): Payer: Medicare Other

## 2019-06-19 ENCOUNTER — Other Ambulatory Visit: Payer: Medicare Other

## 2019-06-19 ENCOUNTER — Ambulatory Visit: Payer: Medicare Other

## 2019-06-19 DIAGNOSIS — Z79899 Other long term (current) drug therapy: Secondary | ICD-10-CM | POA: Diagnosis not present

## 2019-06-19 DIAGNOSIS — Z7902 Long term (current) use of antithrombotics/antiplatelets: Secondary | ICD-10-CM | POA: Diagnosis not present

## 2019-06-19 DIAGNOSIS — R509 Fever, unspecified: Secondary | ICD-10-CM | POA: Diagnosis present

## 2019-06-19 DIAGNOSIS — M353 Polymyalgia rheumatica: Secondary | ICD-10-CM | POA: Diagnosis present

## 2019-06-19 DIAGNOSIS — M8580 Other specified disorders of bone density and structure, unspecified site: Secondary | ICD-10-CM | POA: Diagnosis present

## 2019-06-19 DIAGNOSIS — Z8711 Personal history of peptic ulcer disease: Secondary | ICD-10-CM | POA: Diagnosis not present

## 2019-06-19 DIAGNOSIS — Z20822 Contact with and (suspected) exposure to covid-19: Secondary | ICD-10-CM

## 2019-06-19 DIAGNOSIS — J181 Lobar pneumonia, unspecified organism: Secondary | ICD-10-CM | POA: Diagnosis present

## 2019-06-19 DIAGNOSIS — A419 Sepsis, unspecified organism: Secondary | ICD-10-CM | POA: Diagnosis present

## 2019-06-19 DIAGNOSIS — J9601 Acute respiratory failure with hypoxia: Secondary | ICD-10-CM

## 2019-06-19 DIAGNOSIS — N6019 Diffuse cystic mastopathy of unspecified breast: Secondary | ICD-10-CM | POA: Diagnosis present

## 2019-06-19 DIAGNOSIS — Z79890 Hormone replacement therapy (postmenopausal): Secondary | ICD-10-CM | POA: Diagnosis not present

## 2019-06-19 DIAGNOSIS — N390 Urinary tract infection, site not specified: Secondary | ICD-10-CM | POA: Diagnosis present

## 2019-06-19 DIAGNOSIS — E876 Hypokalemia: Secondary | ICD-10-CM | POA: Diagnosis present

## 2019-06-19 DIAGNOSIS — E871 Hypo-osmolality and hyponatremia: Secondary | ICD-10-CM | POA: Diagnosis present

## 2019-06-19 DIAGNOSIS — E869 Volume depletion, unspecified: Secondary | ICD-10-CM | POA: Diagnosis present

## 2019-06-19 DIAGNOSIS — I252 Old myocardial infarction: Secondary | ICD-10-CM | POA: Diagnosis not present

## 2019-06-19 DIAGNOSIS — Z20828 Contact with and (suspected) exposure to other viral communicable diseases: Secondary | ICD-10-CM | POA: Diagnosis present

## 2019-06-19 DIAGNOSIS — Z66 Do not resuscitate: Secondary | ICD-10-CM | POA: Diagnosis present

## 2019-06-19 DIAGNOSIS — Z955 Presence of coronary angioplasty implant and graft: Secondary | ICD-10-CM | POA: Diagnosis not present

## 2019-06-19 DIAGNOSIS — M1990 Unspecified osteoarthritis, unspecified site: Secondary | ICD-10-CM | POA: Diagnosis present

## 2019-06-19 DIAGNOSIS — E785 Hyperlipidemia, unspecified: Secondary | ICD-10-CM | POA: Diagnosis present

## 2019-06-19 DIAGNOSIS — L4050 Arthropathic psoriasis, unspecified: Secondary | ICD-10-CM | POA: Diagnosis present

## 2019-06-19 DIAGNOSIS — K219 Gastro-esophageal reflux disease without esophagitis: Secondary | ICD-10-CM | POA: Diagnosis present

## 2019-06-19 DIAGNOSIS — I10 Essential (primary) hypertension: Secondary | ICD-10-CM | POA: Diagnosis present

## 2019-06-19 DIAGNOSIS — I2510 Atherosclerotic heart disease of native coronary artery without angina pectoris: Secondary | ICD-10-CM | POA: Diagnosis present

## 2019-06-19 LAB — RESPIRATORY PANEL BY PCR

## 2019-06-19 LAB — CBC
HCT: 29.4 % — ABNORMAL LOW (ref 36.0–46.0)
Hemoglobin: 9.3 g/dL — ABNORMAL LOW (ref 12.0–15.0)
MCH: 29.7 pg (ref 26.0–34.0)
MCHC: 31.6 g/dL (ref 30.0–36.0)
MCV: 93.9 fL (ref 80.0–100.0)
Platelets: 205 10*3/uL (ref 150–400)
RBC: 3.13 MIL/uL — ABNORMAL LOW (ref 3.87–5.11)
RDW: 14.6 % (ref 11.5–15.5)
WBC: 7.2 10*3/uL (ref 4.0–10.5)
nRBC: 0 % (ref 0.0–0.2)

## 2019-06-19 LAB — BASIC METABOLIC PANEL
Anion gap: 10 (ref 5–15)
BUN: 17 mg/dL (ref 8–23)
CO2: 23 mmol/L (ref 22–32)
Calcium: 8 mg/dL — ABNORMAL LOW (ref 8.9–10.3)
Chloride: 100 mmol/L (ref 98–111)
Creatinine, Ser: 1.16 mg/dL — ABNORMAL HIGH (ref 0.44–1.00)
GFR calc Af Amer: 52 mL/min — ABNORMAL LOW (ref >60)
GFR calc non Af Amer: 45 mL/min — ABNORMAL LOW (ref >60)
Glucose, Bld: 95 mg/dL (ref 70–99)
Potassium: 3.7 mmol/L (ref 3.5–5.1)
Sodium: 133 mmol/L — ABNORMAL LOW (ref 135–145)

## 2019-06-19 LAB — SARS CORONAVIRUS 2 (TAT 6-24 HRS): SARS Coronavirus 2: NEGATIVE

## 2019-06-19 LAB — URINE CULTURE

## 2019-06-19 LAB — BRAIN NATRIURETIC PEPTIDE: B Natriuretic Peptide: 114.6 pg/mL — ABNORMAL HIGH (ref 0.0–100.0)

## 2019-06-19 LAB — INFLUENZA PANEL BY PCR (TYPE A & B)
Influenza A By PCR: NEGATIVE
Influenza B By PCR: NEGATIVE

## 2019-06-19 LAB — PROCALCITONIN: Procalcitonin: 1.92 ng/mL

## 2019-06-19 LAB — MAGNESIUM: Magnesium: 1.4 mg/dL — ABNORMAL LOW (ref 1.7–2.4)

## 2019-06-19 MED ORDER — COLESTIPOL HCL 1 G PO TABS
1.0000 g | ORAL_TABLET | Freq: Two times a day (BID) | ORAL | Status: DC
  Administered 2019-06-19 – 2019-06-21 (×4): 1 g via ORAL
  Filled 2019-06-19 (×6): qty 1

## 2019-06-19 MED ORDER — FOLIC ACID 1 MG PO TABS
2.0000 mg | ORAL_TABLET | Freq: Every day | ORAL | Status: DC
  Administered 2019-06-19 – 2019-06-21 (×3): 2 mg via ORAL
  Filled 2019-06-19 (×3): qty 2

## 2019-06-19 MED ORDER — ENOXAPARIN SODIUM 40 MG/0.4ML ~~LOC~~ SOLN
40.0000 mg | Freq: Every day | SUBCUTANEOUS | Status: DC
  Administered 2019-06-19 – 2019-06-21 (×3): 40 mg via SUBCUTANEOUS
  Filled 2019-06-19 (×3): qty 0.4

## 2019-06-19 MED ORDER — BUSPIRONE HCL 5 MG PO TABS
15.0000 mg | ORAL_TABLET | Freq: Every day | ORAL | Status: DC
  Administered 2019-06-19 – 2019-06-21 (×3): 15 mg via ORAL
  Filled 2019-06-19: qty 3
  Filled 2019-06-19: qty 2
  Filled 2019-06-19: qty 3

## 2019-06-19 MED ORDER — RALOXIFENE HCL 60 MG PO TABS
60.0000 mg | ORAL_TABLET | Freq: Every day | ORAL | Status: DC
  Administered 2019-06-19 – 2019-06-21 (×3): 60 mg via ORAL
  Filled 2019-06-19 (×3): qty 1

## 2019-06-19 MED ORDER — SODIUM CHLORIDE 0.9 % IV SOLN
INTRAVENOUS | Status: DC | PRN
  Administered 2019-06-19: 500 mL via INTRAVENOUS

## 2019-06-19 MED ORDER — FLUTICASONE PROPIONATE 50 MCG/ACT NA SUSP
2.0000 | Freq: Every day | NASAL | Status: DC
  Filled 2019-06-19 (×2): qty 16

## 2019-06-19 MED ORDER — HYDROCODONE-ACETAMINOPHEN 5-325 MG PO TABS
1.0000 | ORAL_TABLET | Freq: Four times a day (QID) | ORAL | Status: DC | PRN
  Administered 2019-06-19 – 2019-06-20 (×2): 1 via ORAL
  Filled 2019-06-19 (×3): qty 1

## 2019-06-19 MED ORDER — ACETAMINOPHEN 325 MG PO TABS
650.0000 mg | ORAL_TABLET | Freq: Four times a day (QID) | ORAL | Status: DC | PRN
  Administered 2019-06-19 – 2019-06-21 (×3): 650 mg via ORAL
  Filled 2019-06-19 (×3): qty 2

## 2019-06-19 MED ORDER — ISOSORBIDE MONONITRATE ER 60 MG PO TB24
60.0000 mg | ORAL_TABLET | Freq: Every day | ORAL | Status: DC
  Administered 2019-06-19 – 2019-06-20 (×2): 60 mg via ORAL
  Filled 2019-06-19: qty 2
  Filled 2019-06-19: qty 1

## 2019-06-19 MED ORDER — ATORVASTATIN CALCIUM 10 MG PO TABS
20.0000 mg | ORAL_TABLET | Freq: Every day | ORAL | Status: DC
  Administered 2019-06-19 – 2019-06-21 (×3): 20 mg via ORAL
  Filled 2019-06-19 (×3): qty 2

## 2019-06-19 MED ORDER — ACETAMINOPHEN 650 MG RE SUPP: 650.0000 mg | Freq: Four times a day (QID) | RECTAL | Status: DC | PRN

## 2019-06-19 MED ORDER — IOHEXOL 350 MG/ML SOLN
75.0000 mL | Freq: Once | INTRAVENOUS | Status: AC | PRN
  Administered 2019-06-19: 75 mL via INTRAVENOUS

## 2019-06-19 MED ORDER — CLOPIDOGREL BISULFATE 75 MG PO TABS
75.0000 mg | ORAL_TABLET | Freq: Every day | ORAL | Status: DC
  Administered 2019-06-20 – 2019-06-21 (×2): 75 mg via ORAL
  Filled 2019-06-19 (×2): qty 1

## 2019-06-19 MED ORDER — MELATONIN 3 MG PO TABS
9.0000 mg | ORAL_TABLET | Freq: Every day | ORAL | Status: DC
  Administered 2019-06-19 – 2019-06-20 (×2): 9 mg via ORAL
  Filled 2019-06-19 (×4): qty 3

## 2019-06-19 MED ORDER — PANTOPRAZOLE SODIUM 40 MG PO TBEC
40.0000 mg | DELAYED_RELEASE_TABLET | Freq: Every day | ORAL | Status: DC
  Administered 2019-06-19 – 2019-06-21 (×3): 40 mg via ORAL
  Filled 2019-06-19 (×3): qty 1

## 2019-06-19 NOTE — ED Notes (Signed)
Tele ordered bfast 

## 2019-06-19 NOTE — ED Notes (Signed)
Called to check on breakfast tray.

## 2019-06-19 NOTE — H&P (Signed)
History and Physical    [name] [name] ZOX:[id number] DOB: 1940/11/21 DOA: 06/18/2019  PCP: Abner Greenspan, MD Patient coming from: Home  Chief Complaint: Fever, cough, malaise  HPI: [name] [name] is a 78 y.o. female with medical history significant of CAD status post PCI, anemia, arthritis, anxiety, depression, hypertension, GERD, PUD presenting with complaints of fever, cough, and malaise.  Patient states she has not been feeling well for the past few weeks.  She has been having a cough and was seen by her primary care physician recently.  Reports having chronic shortness of breath, not sure if any change from baseline.  Denies nausea, vomiting, abdominal pain, or diarrhea.  Denies dysuria, urinary frequency, or urgency.  No other complaints.  ED Course: Temperature 101.1 F.  Tachycardic and tachypneic.  Oxygen saturation 86% on room air, improved with 2 L supplemental oxygen.  Blood pressure stable.  No leukocytosis.  Lactic acid normal.  Sodium 129.  Potassium 3.2.  UA with moderate amount of leukocytes, 21-50 WBCs, and many bacteria.  Urine culture pending.  Blood culture x2 pending.  Inflammatory markers including D-dimer, LDH, and fibrinogen elevated.  SARS-CoV-2 test pending.  Chest x-ray showing streaky bibasilar atelectasis, no consolidation. Received Tylenol, potassium supplementation, ceftriaxone, azithromycin, and 1 L fluid bolus.  Review of Systems:  All systems reviewed and apart from history of presenting illness, are negative.  Past Medical History:  Diagnosis Date  . Anemia    of chronic disease  . Anxiety   . Arthritis    "psoratic arthritis; new dx" (09/11/2016)  . CAD S/P percutaneous coronary angioplasty 08/2016   mLCx 90% -> 0% (2.75 mm x 16 mm) Synergy DES PCI Ostial D1 70% (not PTCA target). Moderate ostial and mD2.  LAD tandem 50% stenoses w/ negative FFR (0.86)  . Cancer ([place])    skin cancer on leg  . Chronic lower back pain   . Complication of anesthesia 1980  and 1982   trouble waking up after breast biopsy, many surgeries since no problem  . Depression   . Dyspnea on effort - CHRONIC    partial relief of Sx post PCI of L Circumflex.  . Fibrocystic breast   . GERD (gastroesophageal reflux disease)   . Headache    "monthly" (09/11/2016)  . History of blood transfusion    "several since age 4; I've had them after most of my surgeries" (09/11/2016)  . History of myocardial infarction    "doctor said there are signs I've had a heart attack; don't know when"  - Myoview [name] 2018 indicated small Inferior - Inferoseptal Infarct (vs. rest ischemia). Echo with inferoseptal hypokinesis.  Marland Kitchen Hx of skin cancer, basal cell 2013 and 2012   right inner, lower leg; several scattered around my body  . Hypertension   . Iron deficiency anemia    "I've had an iron infusion"  . Osteoarthritis   . Osteopenia   . Peptic ulcer disease    H pylori  . PMR (polymyalgia rheumatica) (HCC)   . PONV (postoperative nausea and vomiting)   . Psoriasis     Past Surgical History:  Procedure Laterality Date  . APPENDECTOMY    . BACK SURGERY     x 6   . BREAST BIOPSY Left 1980 and 1982   "benign"  . CARDIAC CATHETERIZATION N/A 09/11/2016   Procedure: Left Heart Cath and Coronary Angiography;  Surgeon: Leonie Man, MD;  Location: [facility];  Service: Cardiovascular: mLCx 90% * (  PCI). Ostial D1 70% (not PTCA target). Mod ostial and mD2.  LAD tandem 50% stenoses w/ negative FFR (0.86)  . CARDIAC CATHETERIZATION N/A 09/11/2016   Procedure: Intravascular Pressure Wire/FFR Study;  Surgeon: Leonie Man, MD;  Location: [facility];  Service: Cardiovascular: --  LAD tandem 50% stenoses w/ negative FFR (0.86  . CARDIAC CATHETERIZATION N/A 09/11/2016   Procedure: Coronary Stent Intervention;  Surgeon: Leonie Man, MD;  Location: [facility];  Service: Cardiovascular: -- DES PCI mLCx 90%--0% SYNERGY DES 2.75 MM X 16 MM.  Marland Kitchen CARDIAC CATHETERIZATION  2001    non-obstructive CAD  . CATARACT EXTRACTION W/ INTRAOCULAR LENS  IMPLANT, BILATERAL  July -  August 2017  . DILATION AND CURETTAGE OF UTERUS  X 3  . ESOPHAGOGASTRODUODENOSCOPY  03/2009   ulcer,HP, GERD stricture  . EXCISIONAL HEMORRHOIDECTOMY    . HIP ARTHROPLASTY Left 2010  . JOINT REPLACEMENT    . KNEE ARTHROSCOPY Bilateral   . LAPAROSCOPIC CHOLECYSTECTOMY  2005  . LUMBAR DISC SURGERY  11/1984; 1987; 1989; 2004; 2005 X 2   deg. disk lumber spine   . MOHS SURGERY Right    "inside my lower leg"  . NASAL SINUS SURGERY  1970s  . NM MYOVIEW LTD  08/28/2016   INTERMEDIATE RISK (b/c reduced EF & ? small infarct) -- EF ~35-40%.  ? Prior Small Inferior-apical & septal infarct (w/o ischemia).  Difuse HK - worse in inferoapex.  Suggest prior infarct with no ischemia. Ischemic area correlates with echo WMA  . REVERSE SHOULDER ARTHROPLASTY Left 12/17/2017   Procedure: LEFT REVERSE SHOULDER ARTHROPLASTY;  Surgeon: Netta Cedars, MD;  Location: [facility];  Service: Orthopedics;  Laterality: Left;  . TOTAL HIP ARTHROPLASTY Right 04/04/2013   Procedure: RIGHT TOTAL HIP ARTHROPLASTY ANTERIOR APPROACH;  Surgeon: Mauri Pole, MD;  Location: [facility];  Service: Orthopedics;  Laterality: Right;  . TOTAL KNEE ARTHROPLASTY Right 2009  . TRANSTHORACIC ECHOCARDIOGRAM  08/2016   mild conc LVH. EF 45-50% - anteroseptal & inferoseptal HK. Mod AI.  Marland Kitchen TRANSTHORACIC ECHOCARDIOGRAM  08/2017   (post PCI) --> EF normalized:  Normal LV systolic function; mild diastolic dysfunction; mild   LVH; sclerotic aortic valve with mild AI.  No RWMA  . TUBAL LIGATION    . VAGINAL HYSTERECTOMY  1970s   partial      reports that she has never smoked. She has never used smokeless tobacco. She reports current alcohol use. She reports that she does not use drugs.  Allergies  Allergen Reactions  . 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 Ace Inhibitors     cough  . Oxaprozin     GI upset   .  Pregabalin Swelling    SWELLING REACTION UNSPECIFIED   . Amoxicillin-Pot Clavulanate Nausea And Vomiting    gi upset Has patient had a PCN reaction causing immediate rash, facial/tongue/throat swelling, SOB or lightheadedness with hypotension: No Has patient had a PCN reaction causing severe rash involving mucus membranes or skin necrosis: No Has patient had a PCN reaction that required hospitalization No Has patient had a PCN reaction occurring within the last 10 years: No If all of the above answers are "NO", then may proceed with Cephalosporin use.     Family History  Problem Relation Age of Onset  . Arthritis Mother   . Emphysema Mother   . Hyperlipidemia Mother   . Hypertension Mother   . Heart disease Mother   .  Heart disease Brother        CAD  . Diabetes Paternal Grandmother   . Heart disease Maternal Aunt     Prior to Admission medications   Medication Sig Start Date End Date Taking? Authorizing Provider  acetaminophen (TYLENOL) 500 MG tablet Take 1,000 mg by mouth 2 (two) times daily as needed for moderate pain or headache.     [provider]  atorvastatin (LIPITOR) 20 MG tablet TAKE 1 TABLET BY MOUTH EVERY DAY 04/17/19   Leonie Man, MD  busPIRone (BUSPAR) 15 MG tablet TAKE 1/2 TO 1 TABLET (7.5 TO 15 MG TOTAL) BY MOUTH 2 TIMES DAILY. 06/01/19   Tower, [name], MD  cetirizine (ZYRTEC) 10 MG tablet Take 1 tablet (10 mg total) by mouth daily. 06/16/19   Elby Beck, FNP  clopidogrel (PLAVIX) 75 MG tablet Take 1 tablet (75 mg total) by mouth daily with breakfast. 11/04/18   Leonie Man, MD  colestipol (COLESTID) 1 g tablet Take 1 tablet (1 g total) by mouth 2 (two) times daily. 05/03/19   Mauri Pole, MD  fluticasone (FLONASE) 50 MCG/ACT nasal spray Place 2 sprays into both nostrils daily. 06/16/19   Elby Beck, FNP  folic acid (FOLVITE) 1 MG tablet Take 2 mg by mouth daily.     [provider]  HYDROcodone-acetaminophen  (NORCO/VICODIN) 5-325 MG tablet Take 1-2 tablets by mouth every 4 (four) hours as needed for moderate pain or severe pain. 12/17/17   Netta Cedars, MD  isosorbide mononitrate (IMDUR) 60 MG 24 hr tablet TAKE 1 TABLET BY MOUTH EVERY DAY 08/29/18   Leonie Man, MD  losartan-hydrochlorothiazide [facility]) 50-12.5 MG tablet TAKE 1 TABLET BY MOUTH EVERY DAY 06/15/19   Leonie Man, MD  Melatonin 3 MG CAPS Take 9 mg by mouth at bedtime.     [provider]  methotrexate (RHEUMATREX) 2.5 MG tablet Take 6 tablets (15 mg total) by mouth once a week. (7 tablets) 04/11/19   Nicholas Lose, MD  metoprolol tartrate (LOPRESSOR) 25 MG tablet TAKE 1 TABLET BY MOUTH TWICE A DAY 05/31/19   Leonie Man, MD  nitroGLYCERIN (NITROSTAT) 0.4 MG SL tablet Place 1 tablet (0.4 mg total) under the tongue every 5 (five) minutes as needed for chest pain. 09/08/16 02/21/19  Lyda Jester M, PA-C  pantoprazole (PROTONIX) 40 MG tablet Take 1 tablet (40 mg total) by mouth daily. 05/23/19   Leonie Man, MD  raloxifene (EVISTA) 60 MG tablet Take 1 tablet (60 mg total) by mouth daily. 10/25/18   Tower, [name], MD  Vitamin D, Cholecalciferol, 25 MCG (1000 UT) CAPS Take 1 capsule by mouth daily.    [provider]    Physical Exam: Vitals:   06/18/19 2200 06/18/19 2215 06/18/19 2230 06/19/19 0015  BP: 123/64 114/90 129/74 (!) 119/52  Pulse: (!) 105 (!) 107 (!) 109 96  Resp: (!) 21 19 (!) 22 18  Temp:      SpO2: (!) 86% 97% 98% 97%    Physical Exam  Constitutional: She is oriented to person, place, and time. She appears well-developed and well-nourished. No distress.  HENT:  Head: Normocephalic.  Eyes: Right eye exhibits no discharge. Left eye exhibits no discharge.  Neck: Neck supple.  Cardiovascular: Normal rate, regular rhythm and intact distal pulses.  Pulmonary/Chest: Effort normal. She has no wheezes.  Equal air entry bilaterally Speaking clearly in full sentences On 2 L supplemental  oxygen via nasal cannula  Abdominal: Soft. Bowel sounds are normal. She exhibits no distension. There is no abdominal tenderness. There is no guarding.  Musculoskeletal:        General: No edema.  Neurological: She is alert and oriented to person, place, and time.  Skin: Skin is warm and dry. She is not diaphoretic.     Labs on Admission: I have personally reviewed following labs and imaging studies  CBC: Recent Labs  Lab 06/18/19 2247  WBC 8.4  NEUTROABS 6.4  HGB 9.7*  HCT 30.4*  MCV 92.4  PLT 413   Basic Metabolic Panel: Recent Labs  Lab 06/18/19 2247  NA 129*  K 3.2*  CL 94*  CO2 20*  GLUCOSE 111*  BUN 20  CREATININE 1.29*  CALCIUM 8.2*   GFR: Estimated Creatinine Clearance: 35.4 mL/min (A) (by C-G formula based on SCr of 1.29 mg/dL (H)). Liver Function Tests: Recent Labs  Lab 06/18/19 2247  AST 26  ALT 11  ALKPHOS 52  BILITOT 0.7  PROT 5.6*  ALBUMIN 2.7*   No results for input(s): LIPASE, AMYLASE in the last 168 hours. No results for input(s): AMMONIA in the last 168 hours. Coagulation Profile: Recent Labs  Lab 06/18/19 2247  INR 1.1   Cardiac Enzymes: No results for input(s): CKTOTAL, CKMB, CKMBINDEX, TROPONINI in the last 168 hours. BNP (last 3 results) No results for input(s): PROBNP in the last 8760 hours. HbA1C: No results for input(s): HGBA1C in the last 72 hours. CBG: No results for input(s): GLUCAP in the last 168 hours. Lipid Profile: Recent Labs    06/18/19 2247  TRIG 131   Thyroid Function Tests: No results for input(s): TSH, T4TOTAL, FREET4, T3FREE, THYROIDAB in the last 72 hours. Anemia Panel: No results for input(s): VITAMINB12, FOLATE, FERRITIN, TIBC, IRON, RETICCTPCT in the last 72 hours. Urine analysis:    Component Value Date/Time   COLORURINE YELLOW 06/18/2019 2231   APPEARANCEUR HAZY (A) 06/18/2019 2231   LABSPEC 1.018 06/18/2019 2231   LABSPEC 1.010 11/17/2013 1237   PHURINE 5.0 06/18/2019 2231   GLUCOSEU  NEGATIVE 06/18/2019 2231   GLUCOSEU Negative 11/17/2013 1237   HGBUR NEGATIVE 06/18/2019 2231   HGBUR small 06/03/2007 [place] 06/18/2019 2231   BILIRUBINUR Negative 11/17/2013 [place] 06/18/2019 2231   PROTEINUR NEGATIVE 06/18/2019 2231   UROBILINOGEN 0.2 11/17/2013 1237   NITRITE NEGATIVE 06/18/2019 2231   LEUKOCYTESUR MODERATE (A) 06/18/2019 2231   LEUKOCYTESUR Trace 11/17/2013 1237    Radiological Exams on Admission: Dg Chest Portable 1 View  Result Date: 06/18/2019 CLINICAL DATA:  Fever EXAM: PORTABLE CHEST 1 VIEW COMPARISON:  Radiograph 08/19/2016 FINDINGS: Streaky basilar atelectasis. No consolidation, features of edema, pneumothorax, or effusion. Pulmonary vascularity is normally distributed. The aorta is calcified. The remaining cardiomediastinal contours are unremarkable. No acute osseous or soft tissue abnormality. Left reverse shoulder arthroplasty. Degenerative changes are present in the imaged spine and right shoulder. IMPRESSION: Streaky basilar atelectasis.  No consolidative process. Aortic Atherosclerosis (ICD10-I70.0). Electronically Signed   By: Lovena Le M.D.   On: 06/18/2019 22:21    EKG: Independently reviewed.  Sinus tachycardia, heart rate 103.  Assessment/Plan Principal Problem:   Sepsis ([place]) Active Problems:   UTI (urinary tract infection)   Suspected COVID-19 virus infection   Acute respiratory failure with hypoxia (HCC)   Hyponatremia   Sepsis, UTI, suspicion for COVID-19 viral infection Febrile, tachycardic, and tachypneic.  Blood pressure stable.  No leukocytosis.  Lactic acid normal.  UA with  moderate amount of leukocytes, 21-50 WBCs, and many bacteria.  Although UTI could be causing sepsis, there remains concern for COVID-19 viral infection given hypoxia and elevated inflammatory markers.  Procalcitonin elevated at 1.92. -Received 1 L IV fluid bolus in the ED.  Tachycardia and tachypnea have resolved.  We will  avoid additional IV fluid at this time given suspicion for COVID-19 infection. -Received ceftriaxone and azithromycin.  Continue antibiotics. -Tylenol as needed -Blood culture x2 pending -Urine culture pending -SARS-CoV-2 test pending.  Continue airborne and contact precautions.  Acute hypoxic respiratory failure, suspicion for COVID-19 pneumonia  Oxygen saturation 86% on room air, improved with 2 L supplemental oxygen.  Concern for COVID-19 infection given fever and cough.  Chest x-ray showing streaky bibasilar atelectasis, no consolidation.  PE is also on the differential for hypoxia given elevated D-dimer. -CT angiogram to rule out PE and check for findings consistent with COVID-19 viral pneumonia -Continue supplemental oxygen to keep oxygen saturation above 94% -Continuous pulse ox  Hyponatremia Sodium 129.  Likely related to home thiazide diuretic use. -Received 1 L IV fluid bolus -Repeat labs, continue to monitor sodium level -Check serum osmolarity  Mild hypokalemia Potassium 3.2.    -Replete potassium.  Check magnesium level and replete if low.  Continue to monitor electrolytes.  Mild elevation of creatinine Creatinine 1.2, baseline 0.8-1.0. -Received 1 L fluid bolus -Continue to monitor renal function  Pharmacy med rec pending.  DVT prophylaxis: Lovenox Code Status: Patient wishes to be DNR. Family Communication: Daughter at bedside. Disposition Plan: Anticipate discharge after clinical improvement. Consults called: None Admission status: It is my clinical opinion that admission to [place] is reasonable and necessary in this 78 y.o. female . presenting with sepsis secondary to UTI.  Acute hypoxic respiratory failure requiring supplemental oxygen and concern for COVID-19 viral infection.  Work-up pending.  High risk of decompensation.  Given the aforementioned, the predictability of an adverse outcome is felt to be significant. I expect that the patient will require at  least 2 midnights in the hospital to treat this condition.   The medical decision making on this patient was of high complexity and the patient is at high risk for clinical deterioration, therefore this is a level 3 visit.  Shela Leff MD [facility] Pager [phone]  If 7PM-7AM, please contact night-coverage [email or web address] Password [email or web address]  06/19/2019, 1:45 AM

## 2019-06-19 NOTE — Progress Notes (Signed)
PROGRESS NOTE  [name] [name] XNA:[id number] DOB: 1941/03/26 DOA: 06/18/2019 PCP: Abner Greenspan, MD  Brief History:  78 y.o. female with medical history significant of CAD status post PCI, anemia, arthritis, anxiety, depression, hypertension, GERD, PUD presenting with complaints of fever, cough, sob and malaise.  The patient is a difficult historian, but she states that she has had dyspnea on exertion for the better part of 2 years.  She states that this may have worsened slightly over the past few days.  She denies any chest pain, hemoptysis, headache, neck pain, vomiting, diarrhea, abdominal pain, hematochezia, melena.  The patient has chronic loose stools but not frank diarrhea.  In the emergency department, the patient had a fever up to 101.1 F, but she was hemodynamically stable.  Oxygen saturation was 86% on room air patient was placed on 2 L with oxygen saturation 100%.  BMP was essentially unremarkable except for sodium 133, serum creatinine 1.29.  WBC was 8.4 with hemoglobin 9.7 and platelets 205,000.  Lactic acid was 1.2 procalcitonin 1.92.  Assessment/Plan: Sepsis -Secondary to UTI and pneumonia -Continue ceftriaxone and azithromycin -UA 21-50 WBC -Follow blood and urine cultures -Lactic acid peaked 1.2 -Procalcitonin 1.92 -influenza PCR -viral respiratory panel  Lobar pneumonia -06/19/2019 CT angiogram chest--diffuse interstitial prominence with confluent airspace opacities -The patient is clinically euvolemic -PCT 1.92 -BNP 114.6 -Continue ceftriaxone and azithromycin data -SARS-CoV2--neg  Acute respiratory failure with hypoxia -Secondary to pneumonia -Presently stable on 2 L -Wean oxygen for saturation greater 92%  UTI -Urinalysis as discussed above -Follow urine culture -Continue ceftriaxone  Hyponatremia -Secondary to volume depletion -Gentle fluid hydration  Coronary artery disease -No chest pain presently -Personally reviewed EKG--no  concerning ischemic change -Is continue Imdur and Plavix  Essential hypertension -Holding losartan/HCTZ and metoprolol titrate secondary to soft blood pressure  Hyperlipidemia -Continue statin    Disposition Plan:   Home in 2-3 days  Family Communication:   Daughter updated at bedside 10/26  Consultants:  none  Code Status:  DNR  DVT Prophylaxis: [place] Lovenox   Procedures: As Listed in Progress Note Above  Antibiotics: Ceftriaxone 10/25>>> Azithromycin 10/25>>>   Total time spent 35 minutes.  Greater than 50% spent face to face counseling and coordinating care.    Subjective: Patient denies fevers, chills, headache, chest pain, dyspnea, nausea, vomiting, diarrhea, abdominal pain, dysuria,   Objective: Vitals:   06/19/19 0715 06/19/19 0731 06/19/19 0800 06/19/19 0900  BP:  128/67    Pulse: 79 (!) 102 99 88  Resp: 16 (!) 24 19 19   Temp:      SpO2: 100% 99% 100% 100%    Intake/Output Summary (Last 24 hours) at 06/19/2019 0929 Last data filed at 06/19/2019 0019 Gross per 24 hour  Intake 1350 ml  Output -  Net 1350 ml   Weight change:  Exam:   General:  Pt is alert, follows commands appropriately, not in acute distress  HEENT: No icterus, No thrush, No neck mass, [place]/AT  Cardiovascular: RRR, S1/S2, no rubs, no gallops  Respiratory: bibasilar crackles R>L.  diminshed BS R>L  Abdomen: Soft/+BS, non tender, non distended, no guarding  Extremities: No edema, No lymphangitis, No petechiae, No rashes, no synovitis   Data Reviewed: I have personally reviewed following labs and imaging studies Basic Metabolic Panel: Recent Labs  Lab 06/18/19 2247 06/19/19 0702  NA 129* 133*  K 3.2* 3.7  CL 94* 100  CO2 20* 23  GLUCOSE 111*  95  BUN 20 17  CREATININE 1.29* 1.16*  CALCIUM 8.2* 8.0*  MG  --  1.4*   Liver Function Tests: Recent Labs  Lab 06/18/19 2247  AST 26  ALT 11  ALKPHOS 52  BILITOT 0.7  PROT 5.6*  ALBUMIN 2.7*   No results for  input(s): LIPASE, AMYLASE in the last 168 hours. No results for input(s): AMMONIA in the last 168 hours. Coagulation Profile: Recent Labs  Lab 06/18/19 2247  INR 1.1   CBC: Recent Labs  Lab 06/18/19 2247 06/19/19 0702  WBC 8.4 7.2  NEUTROABS 6.4  --   HGB 9.7* 9.3*  HCT 30.4* 29.4*  MCV 92.4 93.9  PLT 242 205   Cardiac Enzymes: No results for input(s): CKTOTAL, CKMB, CKMBINDEX, TROPONINI in the last 168 hours. BNP: Invalid input(s): POCBNP CBG: No results for input(s): GLUCAP in the last 168 hours. HbA1C: No results for input(s): HGBA1C in the last 72 hours. Urine analysis:    Component Value Date/Time   COLORURINE YELLOW 06/18/2019 2231   APPEARANCEUR HAZY (A) 06/18/2019 2231   LABSPEC 1.018 06/18/2019 2231   LABSPEC 1.010 11/17/2013 1237   PHURINE 5.0 06/18/2019 2231   GLUCOSEU NEGATIVE 06/18/2019 2231   GLUCOSEU Negative 11/17/2013 1237   HGBUR NEGATIVE 06/18/2019 2231   HGBUR small 06/03/2007 [place] 06/18/2019 2231   BILIRUBINUR Negative 11/17/2013 [place] 06/18/2019 2231   PROTEINUR NEGATIVE 06/18/2019 2231   UROBILINOGEN 0.2 11/17/2013 1237   NITRITE NEGATIVE 06/18/2019 2231   LEUKOCYTESUR MODERATE (A) 06/18/2019 2231   LEUKOCYTESUR Trace 11/17/2013 1237   Sepsis Labs: @LABRCNTIP (procalcitonin:4,lacticidven:4) ) Recent Results (from the past 240 hour(s))  Culture, blood (Routine x 2)     Status: None (Preliminary result)   Collection Time: 06/18/19 10:47 PM   Specimen: BLOOD  Result Value Ref Range Status   Specimen Description BLOOD BLOOD RIGHT FOREARM  Final   Special Requests   Final    IV BOTTLES DRAWN AEROBIC AND ANAEROBIC Blood Culture adequate volume   Culture   Final    NO GROWTH < 12 HOURS Performed at [facility], [place] [address], [place], [place] [address]    Report Status PENDING  Incomplete  SARS CORONAVIRUS 2 ([place] 6-24 HRS) Nasopharyngeal Nasopharyngeal Swab     Status: None   Collection  Time: 06/18/19 11:18 PM   Specimen: Nasopharyngeal Swab  Result Value Ref Range Status   SARS Coronavirus 2 NEGATIVE NEGATIVE Final    Comment: (NOTE) SARS-CoV-2 target nucleic acids are NOT DETECTED. The SARS-CoV-2 RNA is generally detectable in upper and lower respiratory specimens during the acute phase of infection. Negative results do not preclude SARS-CoV-2 infection, do not rule out co-infections with other pathogens, and should not be used as the sole basis for treatment or other patient management decisions. Negative results must be combined with clinical observations, patient history, and epidemiological information. The expected result is Negative. Fact Sheet for Patients: SugarRoll.be Fact Sheet for Healthcare Providers: https://www.woods-mathews.com/ This test is not yet approved or cleared by the [place] FDA and  has been authorized for detection and/or diagnosis of SARS-CoV-2 by FDA under an Emergency Use Authorization (EUA). This EUA will remain  in effect (meaning this test can be used) for the duration of the COVID-19 declaration under Section 56 4(b)(1) of the Act, 21 U.S.C. section 360bbb-3(b)(1), unless the authorization is terminated or revoked sooner. Performed at [facility], [place] [address] [place], [place]  [address]      Scheduled Meds: . enoxaparin (LOVENOX) injection  40 mg Subcutaneous Daily   Continuous Infusions: . azithromycin Stopped (06/19/19 0018)  . cefTRIAXone (ROCEPHIN)  IV Stopped (06/18/19 2340)    Procedures/Studies: Ct Angio Chest Pe W Or Wo Contrast  Result Date: 06/19/2019 CLINICAL DATA:  78 year old female with shortness of breath, fever, and cough. EXAM: CT ANGIOGRAPHY CHEST WITH CONTRAST TECHNIQUE: Multidetector CT imaging of the chest was performed using the standard protocol during bolus administration of intravenous contrast. Multiplanar CT image reconstructions and MIPs  were obtained to evaluate the vascular anatomy. CONTRAST:  55mL OMNIPAQUE IOHEXOL 350 MG/ML SOLN COMPARISON:  Chest radiograph dated 06/18/2019 FINDINGS: Evaluation is limited due to streak artifact caused by left shoulder arthroplasty. Cardiovascular: There is mild cardiomegaly. No pericardial effusion. Multi vessel coronary vascular calcification. Moderate atherosclerotic calcification of the thoracic aorta. Evaluation of the pulmonary arteries is limited due to suboptimal opacification and timing of the contrast. No definite large or central pulmonary artery embolus identified. Mediastinum/Nodes: Mildly enlarged right hilar lymph node measures 15 mm. Prevascular lymph node measures approximately 11 mm in short axis. The esophagus is grossly unremarkable. No mediastinal fluid collection. Lungs/Pleura: Diffuse bilateral interstitial prominence and confluent airspace densities most consistent with edema. Pneumonia is not excluded. Clinical correlation is recommended. There is no pleural effusion or pneumothorax. The central airways are patent. Upper Abdomen: No acute abnormality. Musculoskeletal: Left shoulder arthroplasty. No acute osseous pathology. Review of the MIP images confirms the above findings. IMPRESSION: 1. No CT evidence of central pulmonary artery embolus. 2. Cardiomegaly with findings most consistent with CHF. Pneumonia is not excluded. Clinical correlation is recommended. Aortic Atherosclerosis (ICD10-I70.0). Electronically Signed   By: Anner Crete M.D.   On: 06/19/2019 04:02   Dg Chest Portable 1 View  Result Date: 06/18/2019 CLINICAL DATA:  Fever EXAM: PORTABLE CHEST 1 VIEW COMPARISON:  Radiograph 08/19/2016 FINDINGS: Streaky basilar atelectasis. No consolidation, features of edema, pneumothorax, or effusion. Pulmonary vascularity is normally distributed. The aorta is calcified. The remaining cardiomediastinal contours are unremarkable. No acute osseous or soft tissue abnormality. Left  reverse shoulder arthroplasty. Degenerative changes are present in the imaged spine and right shoulder. IMPRESSION: Streaky basilar atelectasis.  No consolidative process. Aortic Atherosclerosis (ICD10-I70.0). Electronically Signed   By: Lovena Le M.D.   On: 06/18/2019 22:21    Orson Eva, DO  [facility] Pager [phone]  If 7PM-7AM, please contact night-coverage [email or web address] Password [email or web address] 06/19/2019, 9:29 AM   LOS: 0 days

## 2019-06-19 NOTE — ED Notes (Signed)
HEART HEALTHY LUNCH TRAY ORDERED

## 2019-06-20 DIAGNOSIS — R509 Fever, unspecified: Secondary | ICD-10-CM

## 2019-06-20 LAB — OSMOLALITY: Osmolality: 276 mOsm/kg (ref 275–295)

## 2019-06-20 LAB — GLUCOSE, CAPILLARY
Glucose-Capillary: 93 mg/dL (ref 70–99)
Glucose-Capillary: 106 mg/dL — ABNORMAL HIGH (ref 70–99)

## 2019-06-20 MED ORDER — AZITHROMYCIN 500 MG PO TABS
500.0000 mg | ORAL_TABLET | Freq: Every day | ORAL | Status: DC
  Administered 2019-06-20: 500 mg via ORAL
  Filled 2019-06-20: qty 1

## 2019-06-20 NOTE — Plan of Care (Signed)
  Problem: Education: Goal: Knowledge of General Education information will improve Description: Including pain rating scale, medication(s)/side effects and non-pharmacologic comfort measures Outcome: Progressing   Problem: Safety: Goal: Ability to remain free from injury will improve Outcome: Progressing   

## 2019-06-20 NOTE — Progress Notes (Signed)
PROGRESS NOTE    [name] [name]  IHK:[id number] DOB: 1940-12-25 DOA: 06/18/2019 PCP: Abner Greenspan, MD    Brief Narrative:  78 y.o.femalewith medical history significant ofCAD status post PCI, anemia, arthritis, anxiety, depression, hypertension, GERD, PUD presenting with complaints of fever, cough, sob and malaise.  The patient is a difficult historian, but she states that she has had dyspnea on exertion for the better part of 2 years.  She states that this may have worsened slightly over the past few days.  She denies any chest pain, hemoptysis, headache, neck pain, vomiting, diarrhea, abdominal pain, hematochezia, melena.  The patient has chronic loose stools but not frank diarrhea.  In the emergency department, the patient had a fever up to 101.1 F, but she was hemodynamically stable.  Oxygen saturation was 86% on room air patient was placed on 2 L with oxygen saturation 100%.  BMP was essentially unremarkable except for sodium 133, serum creatinine 1.29.  WBC was 8.4 with hemoglobin 9.7 and platelets 205,000.  Lactic acid was 1.2 procalcitonin 1.92.  Assessment & Plan:   Principal Problem:   Sepsis ([place]) Active Problems:   UTI (urinary tract infection)   Suspected COVID-19 virus infection   Acute respiratory failure with hypoxia (H[facility])   Hyponatremia   Sepsis due to undetermined organism ([place])  Sepsis -Secondary to UTI and pneumonia -Continue ceftriaxone and azithromycin -UA 21-50 WBC -Follow blood and urine cultures -Lactic acid peaked 1.2 -Procalcitonin 1.92 -influenza PCR -COVID neg -viral respiratory panel negative -Sepsis physiology resolved  Lobar pneumonia -06/19/2019 CT angiogram chest personally reviewed--diffuse interstitial prominence with confluent airspace opacities -The patient is appears euvolemic -PCT 1.92 -BNP 114.6 -Continue ceftriaxone and azithromycin as tolerated -SARS-CoV2--neg  Acute respiratory failure with hypoxia -Secondary to  pneumonia -Presently stable on 2 L this AM, weaned to [facility] -Wean oxygen for saturation greater 92%  UTI -Urinalysis as discussed above -Urine culture with multiple growth, recollection was recommended. Have reordered urine culture -Continue ceftriaxone  Hyponatremia -Secondary to volume depletion -Gentle fluid hydration given  Coronary artery disease -No chest pain presently -No evidence of ischemic change reported -Will continue on Imdur and Plavix  Essential hypertension -Holding losartan/HCTZ and metoprolol titrate secondary to soft blood pressures initially -BP currently controlled  Hyperlipidemia -Continue statin as toelrated  DVT prophylaxis: Lovenox subQ Code Status: DNR Family Communication: Pt in room, family not at bedside Disposition Plan: Uncertain at this time  Consultants:     Procedures:     Antimicrobials: Anti-infectives (From admission, onward)   Start     Dose/Rate Route Frequency Ordered Stop   06/20/19 2200  azithromycin (ZITHROMAX) tablet 500 mg     500 mg Oral Daily 06/20/19 0905     06/18/19 2215  cefTRIAXone (ROCEPHIN) 2 g in sodium chloride 0.9 % 100 mL IVPB     2 g 200 mL/hr over 30 Minutes Intravenous Every 24 hours 06/18/19 2206     06/18/19 2215  azithromycin (ZITHROMAX) 500 mg in sodium chloride 0.9 % 250 mL IVPB  Status:  Discontinued     500 mg 250 mL/hr over 60 Minutes Intravenous Every 24 hours 06/18/19 2206 06/20/19 0905       Subjective: Complains of continued malaise  Objective: Vitals:   06/19/19 2159 06/20/19 0453 06/20/19 0900 06/20/19 1500  BP: (!) 106/53 (!) 113/57 (!) 113/52   Pulse: 95 98 93 85  Resp: 16 16 16  (!) 22  Temp: 98.3 F (36.8 C) 99 F (37.2 C) 99 F (37.2  C) 98.1 F (36.7 C)  TempSrc: Oral Oral Oral   SpO2: 100% 96% 96% 93%    Intake/Output Summary (Last 24 hours) at 06/20/2019 1627 Last data filed at 06/20/2019 0900 Gross per 24 hour  Intake 798.2 ml  Output --  Net 798.2 ml    There were no vitals filed for this visit.  Examination:  General exam: Appears calm and comfortable  Respiratory system: Clear to auscultation. Respiratory effort normal. Cardiovascular system: S1 & S2 heard, RRR Gastrointestinal system: Abdomen is nondistended, soft and nontender. No organomegaly or masses felt. Normal bowel sounds heard. Central nervous system: Alert and oriented. No focal neurological deficits. Extremities: Symmetric 5 x 5 power. Skin: No rashes, lesions  Psychiatry: Judgement and insight appear normal. Mood & affect appropriate.   Data Reviewed: I have personally reviewed following labs and imaging studies  CBC: Recent Labs  Lab 06/18/19 2247 06/19/19 0702  WBC 8.4 7.2  NEUTROABS 6.4  --   HGB 9.7* 9.3*  HCT 30.4* 29.4*  MCV 92.4 93.9  PLT 242 378   Basic Metabolic Panel: Recent Labs  Lab 06/18/19 2247 06/19/19 0702  NA 129* 133*  K 3.2* 3.7  CL 94* 100  CO2 20* 23  GLUCOSE 111* 95  BUN 20 17  CREATININE 1.29* 1.16*  CALCIUM 8.2* 8.0*  MG  --  1.4*   GFR: Estimated Creatinine Clearance: 39.4 mL/min (A) (by C-G formula based on SCr of 1.16 mg/dL (H)). Liver Function Tests: Recent Labs  Lab 06/18/19 2247  AST 26  ALT 11  ALKPHOS 52  BILITOT 0.7  PROT 5.6*  ALBUMIN 2.7*   No results for input(s): LIPASE, AMYLASE in the last 168 hours. No results for input(s): AMMONIA in the last 168 hours. Coagulation Profile: Recent Labs  Lab 06/18/19 2247  INR 1.1   Cardiac Enzymes: No results for input(s): CKTOTAL, CKMB, CKMBINDEX, TROPONINI in the last 168 hours. BNP (last 3 results) No results for input(s): PROBNP in the last 8760 hours. HbA1C: No results for input(s): HGBA1C in the last 72 hours. CBG: Recent Labs  Lab 06/20/19 0654 06/20/19 1154  GLUCAP 93 106*   Lipid Profile: Recent Labs    06/18/19 2247  TRIG 131   Thyroid Function Tests: No results for input(s): TSH, T4TOTAL, FREET4, T3FREE, THYROIDAB in the last 72  hours. Anemia Panel: No results for input(s): VITAMINB12, FOLATE, FERRITIN, TIBC, IRON, RETICCTPCT in the last 72 hours. Sepsis Labs: Recent Labs  Lab 06/18/19 2247  PROCALCITON 1.92  LATICACIDVEN 1.2    Recent Results (from the past 240 hour(s))  Urine culture     Status: Abnormal   Collection Time: 06/18/19 10:32 PM   Specimen: In/Out Cath Urine  Result Value Ref Range Status   Specimen Description IN/OUT CATH URINE  Final   Special Requests   Final    NONE Performed at [facility], [place] [address], [place], [place] [address]    Culture MULTIPLE SPECIES PRESENT, SUGGEST RECOLLECTION (A)  Final   Report Status 06/19/2019 FINAL  Final  Culture, blood (Routine x 2)     Status: None (Preliminary result)   Collection Time: 06/18/19 10:47 PM   Specimen: BLOOD  Result Value Ref Range Status   Specimen Description BLOOD BLOOD RIGHT FOREARM  Final   Special Requests   Final    BOTTLES DRAWN AEROBIC AND ANAEROBIC Blood Culture adequate volume   Culture   Final    NO GROWTH 2 DAYS Performed at [facility], [place] [address] [place], [place] [address]    Report Status PENDING  Incomplete  SARS CORONAVIRUS 2 (TAT 6-24 HRS) Nasopharyngeal Nasopharyngeal Swab     Status: None   Collection Time: 06/18/19 11:18 PM   Specimen: Nasopharyngeal Swab  Result Value Ref Range Status   SARS Coronavirus 2 NEGATIVE NEGATIVE Final    Comment: (NOTE) SARS-CoV-2 target nucleic acids are NOT DETECTED. The SARS-CoV-2 RNA is generally detectable in upper and lower respiratory specimens during the acute phase of infection. Negative results do not preclude SARS-CoV-2 infection, do not rule out co-infections with other pathogens, and should not be used as the sole basis for treatment or other patient management decisions. Negative results must be combined with clinical observations, patient history, and epidemiological information. The expected result is Negative. Fact Sheet for  Patients: SugarRoll.be Fact Sheet for Healthcare Providers: https://www.woods-mathews.com/ This test is not yet approved or cleared by the [place] FDA and  has been authorized for detection and/or diagnosis of SARS-CoV-2 by FDA under an Emergency Use Authorization (EUA). This EUA will remain  in effect (meaning this test can be used) for the duration of the COVID-19 declaration under Section 56 4(b)(1) of the Act, 21 U.S.C. section 360bbb-3(b)(1), unless the authorization is terminated or revoked sooner. Performed at [facility], [place] [address], [place], [place] [address]   Culture, blood (Routine x 2)     Status: None (Preliminary result)   Collection Time: 06/19/19  8:22 AM   Specimen: BLOOD RIGHT ARM  Result Value Ref Range Status   Specimen Description BLOOD RIGHT ARM  Final   Special Requests   Final    BOTTLES DRAWN AEROBIC AND ANAEROBIC Blood Culture adequate volume   Culture   Final    NO GROWTH 1 DAY Performed at [facility], [place] [address], [place], [place] [address]    Report Status PENDING  Incomplete  Respiratory Panel by PCR     Status: None   Collection Time: 06/19/19  9:29 AM   Specimen: Nasopharyngeal Swab; Respiratory  Result Value Ref Range Status   Adenovirus NOT DETECTED NOT DETECTED Final   Coronavirus 229E NOT DETECTED NOT DETECTED Final    Comment: (NOTE) The Coronavirus on the Respiratory Panel, DOES NOT test for the novel  Coronavirus (2019 nCoV)    Coronavirus HKU1 NOT DETECTED NOT DETECTED Final   Coronavirus NL63 NOT DETECTED NOT DETECTED Final   Coronavirus OC43 NOT DETECTED NOT DETECTED Final   Metapneumovirus NOT DETECTED NOT DETECTED Final   Rhinovirus / Enterovirus NOT DETECTED NOT DETECTED Final   Influenza A NOT DETECTED NOT DETECTED Final   Influenza B NOT DETECTED NOT DETECTED Final   Parainfluenza Virus 1 NOT DETECTED NOT DETECTED Final   Parainfluenza Virus 2 NOT DETECTED  NOT DETECTED Final   Parainfluenza Virus 3 NOT DETECTED NOT DETECTED Final   Parainfluenza Virus 4 NOT DETECTED NOT DETECTED Final   Respiratory Syncytial Virus NOT DETECTED NOT DETECTED Final   Bordetella pertussis NOT DETECTED NOT DETECTED Final   Chlamydophila pneumoniae NOT DETECTED NOT DETECTED Final   Mycoplasma pneumoniae NOT DETECTED NOT DETECTED Final    Comment: Performed at [facility], [place]. [address] [place], [place] [address]     Radiology Studies: Ct Angio Chest Pe W Or Wo Contrast  Result Date: 06/19/2019 CLINICAL DATA:  78 year old female with shortness of breath, fever, and cough. EXAM: CT ANGIOGRAPHY CHEST WITH CONTRAST TECHNIQUE: Multidetector CT imaging  of the chest was performed using the standard protocol during bolus administration of intravenous contrast. Multiplanar CT image reconstructions and MIPs were obtained to evaluate the vascular anatomy. CONTRAST:  52mL OMNIPAQUE IOHEXOL 350 MG/ML SOLN COMPARISON:  Chest radiograph dated 06/18/2019 FINDINGS: Evaluation is limited due to streak artifact caused by left shoulder arthroplasty. Cardiovascular: There is mild cardiomegaly. No pericardial effusion. Multi vessel coronary vascular calcification. Moderate atherosclerotic calcification of the thoracic aorta. Evaluation of the pulmonary arteries is limited due to suboptimal opacification and timing of the contrast. No definite large or central pulmonary artery embolus identified. Mediastinum/Nodes: Mildly enlarged right hilar lymph node measures 15 mm. Prevascular lymph node measures approximately 11 mm in short axis. The esophagus is grossly unremarkable. No mediastinal fluid collection. Lungs/Pleura: Diffuse bilateral interstitial prominence and confluent airspace densities most consistent with edema. Pneumonia is not excluded. Clinical correlation is recommended. There is no pleural effusion or pneumothorax. The central airways are patent. Upper Abdomen: No acute  abnormality. Musculoskeletal: Left shoulder arthroplasty. No acute osseous pathology. Review of the MIP images confirms the above findings. IMPRESSION: 1. No CT evidence of central pulmonary artery embolus. 2. Cardiomegaly with findings most consistent with CHF. Pneumonia is not excluded. Clinical correlation is recommended. Aortic Atherosclerosis (ICD10-I70.0). Electronically Signed   By: Anner Crete M.D.   On: 06/19/2019 04:02   Dg Chest Portable 1 View  Result Date: 06/18/2019 CLINICAL DATA:  Fever EXAM: PORTABLE CHEST 1 VIEW COMPARISON:  Radiograph 08/19/2016 FINDINGS: Streaky basilar atelectasis. No consolidation, features of edema, pneumothorax, or effusion. Pulmonary vascularity is normally distributed. The aorta is calcified. The remaining cardiomediastinal contours are unremarkable. No acute osseous or soft tissue abnormality. Left reverse shoulder arthroplasty. Degenerative changes are present in the imaged spine and right shoulder. IMPRESSION: Streaky basilar atelectasis.  No consolidative process. Aortic Atherosclerosis (ICD10-I70.0). Electronically Signed   By: Lovena Le M.D.   On: 06/18/2019 22:21    Scheduled Meds:  atorvastatin  20 mg Oral Daily   azithromycin  500 mg Oral Daily   busPIRone  15 mg Oral Daily   clopidogrel  75 mg Oral Q breakfast   colestipol  1 g Oral BID   enoxaparin (LOVENOX) injection  40 mg Subcutaneous Daily   fluticasone  2 spray Each Nare Daily   folic acid  2 mg Oral Daily   isosorbide mononitrate  60 mg Oral Daily   Melatonin  9 mg Oral QHS   pantoprazole  40 mg Oral Daily   raloxifene  60 mg Oral Daily   Continuous Infusions:  sodium chloride 500 mL (06/19/19 2239)   cefTRIAXone (ROCEPHIN)  IV 2 g (06/19/19 2240)     LOS: 1 day   Marylu Lund, MD [facility] Pager On Amion  If 7PM-7AM, please contact night-coverage 06/20/2019, 4:27 PM

## 2019-06-21 LAB — CBC
HCT: 27.6 % — ABNORMAL LOW (ref 36.0–46.0)
Hemoglobin: 8.7 g/dL — ABNORMAL LOW (ref 12.0–15.0)
MCH: 29.4 pg (ref 26.0–34.0)
MCHC: 31.5 g/dL (ref 30.0–36.0)
MCV: 93.2 fL (ref 80.0–100.0)
Platelets: 270 10*3/uL (ref 150–400)
RBC: 2.96 MIL/uL — ABNORMAL LOW (ref 3.87–5.11)
RDW: 14.4 % (ref 11.5–15.5)
WBC: 6.8 10*3/uL (ref 4.0–10.5)
nRBC: 0 % (ref 0.0–0.2)

## 2019-06-21 LAB — COMPREHENSIVE METABOLIC PANEL
ALT: 10 U/L (ref 0–44)
AST: 27 U/L (ref 15–41)
Albumin: 2.3 g/dL — ABNORMAL LOW (ref 3.5–5.0)
Alkaline Phosphatase: 43 U/L (ref 38–126)
Anion gap: 10 (ref 5–15)
BUN: 8 mg/dL (ref 8–23)
CO2: 26 mmol/L (ref 22–32)
Calcium: 8.4 mg/dL — ABNORMAL LOW (ref 8.9–10.3)
Chloride: 100 mmol/L (ref 98–111)
Creatinine, Ser: 1.02 mg/dL — ABNORMAL HIGH (ref 0.44–1.00)
GFR calc Af Amer: >60 mL/min (ref >60)
GFR calc non Af Amer: 52 mL/min — ABNORMAL LOW (ref >60)
Glucose, Bld: 89 mg/dL (ref 70–99)
Potassium: 3.4 mmol/L — ABNORMAL LOW (ref 3.5–5.1)
Sodium: 136 mmol/L (ref 135–145)
Total Bilirubin: 0.3 mg/dL (ref 0.3–1.2)
Total Protein: 4.9 g/dL — ABNORMAL LOW (ref 6.5–8.1)

## 2019-06-21 LAB — URINE CULTURE: Culture: <10000 — AB

## 2019-06-21 MED ORDER — CEFDINIR 300 MG PO CAPS
300.0000 mg | ORAL_CAPSULE | Freq: Two times a day (BID) | ORAL | Status: DC
  Administered 2019-06-21: 300 mg via ORAL
  Filled 2019-06-21 (×3): qty 1

## 2019-06-21 MED ORDER — POTASSIUM CHLORIDE CRYS ER 20 MEQ PO TBCR
40.0000 meq | EXTENDED_RELEASE_TABLET | Freq: Once | ORAL | Status: AC
  Administered 2019-06-21: 40 meq via ORAL
  Filled 2019-06-21: qty 2

## 2019-06-21 MED ORDER — CEFDINIR 300 MG PO CAPS: 300.0000 mg | ORAL_CAPSULE | Freq: Two times a day (BID) | ORAL | 0 refills | Status: AC

## 2019-06-21 MED ORDER — ISOSORBIDE MONONITRATE ER 30 MG PO TB24
15.0000 mg | ORAL_TABLET | Freq: Every day | ORAL | Status: DC
  Administered 2019-06-21: 15 mg via ORAL
  Filled 2019-06-21: qty 1

## 2019-06-21 NOTE — Plan of Care (Signed)
  Problem: Education: Goal: Knowledge of General Education information will improve Description: Including pain rating scale, medication(s)/side effects and non-pharmacologic comfort measures 06/21/2019 0304 by Suzy Bouchard, RN Outcome: Progressing 06/20/2019 2024 by Suzy Bouchard, RN Outcome: Progressing   Problem: Education: Goal: Knowledge of General Education information will improve Description: Including pain rating scale, medication(s)/side effects and non-pharmacologic comfort measures 06/21/2019 0304 by Suzy Bouchard, RN Outcome: Progressing 06/20/2019 2024 by Suzy Bouchard, RN Outcome: Progressing

## 2019-06-21 NOTE — Discharge Summary (Signed)
Physician Discharge Summary  [name] [name] KGY:[id number] DOB: [birth year] DOA: 06/18/2019  PCP: Abner Greenspan, MD  Admit date: 06/18/2019 Discharge date: 06/21/2019  Time spent: 35 minutes  Recommendations for Outpatient Follow-up:  1. PCP Dr. Glori Bickers in 1 week, consider follow-up chest x-ray in 6 weeks, Imdur discontinued due to soft BPs through this admission, consider restarting at a lower dose if BP trends back up at follow-up   Discharge Diagnoses:  Principal Problem:   Sepsis ([place])   Community-acquired pneumonia   Acute respiratory failure with hypoxia ([place])   Hyponatremia   Sepsis due to undetermined organism [facility])   Discharge Condition: improved  Diet recommendation: Heart healthy  There were no vitals filed for this visit.  History of present illness:  78 y.o.femalewith medical history significant ofCAD status post PCI, anemia, arthritis, anxiety, depression, hypertension, GERD, PUD presenting with complaints of fever, cough, sob and malaise.  The patient is a difficult historian, but she states that she has had dyspnea on exertion for the better part of 2 years.  She states that this may have worsened slightly over the past few days.  She denies any chest pain, hemoptysis, headache, neck pain, vomiting, diarrhea, abdominal pain, hematochezia, melena.  The patient has chronic loose stools but not frank diarrhea.  In the emergency department, the patient had a fever up to 101.1 F, but she was hemodynamically stable.  Oxygen saturation was 86% on room air patient was placed on 2 L with oxygen saturation 100%.  BMP was essentially unremarkable except for sodium 133, serum creatinine 1.29.  WBC was 8.4 with hemoglobin 9.7 and platelets 205,000.  Lactic acid was 1.2 procalcitonin 1.92.  Hospital Course:   Sepsis secondary to community-acquired pneumonia -Patient is admitted with dyspnea, cough, fever of 101.2 -Imaging noted diffuse interstitial prominence with confluent  airspace opacities, COVID-19 PCR is negative, -Clinically improved with IV ceftriaxone and azithromycin -Influenza and respiratory virus panel was also negative -Discharged home on oral antibiotics for 4 more days to complete therapy  Acute respiratory failure with hypoxia -Secondary to pneumonia -Improved, weaned off O2  Hyponatremia -Resolved with hydration  Coronary artery disease -Stable -continue Plavix and metoprolol  Essential hypertension -Resumed losartan/HCTZ and metoprolol -Held Imdur because of softer BP  Hyperlipidemia -Continue statin    Code Status:  DNR  Discharge Exam: Vitals:   06/21/19 0446 06/21/19 0847  BP: 134/61 (!) 101/44  Pulse: 85 85  Resp: 16 18  Temp: 98.2 F (36.8 C) 98 F (36.7 C)  SpO2: 97% 96%    General: AAOx3 Cardiovascular: S1s2/RRR Respiratory: few basilar ronchi  Discharge Instructions   Discharge Instructions    Diet - low sodium heart healthy   Complete by: As directed    Increase activity slowly   Complete by: As directed      Allergies as of 06/21/2019      Reactions   Sulfonamide Derivatives Other (See Comments)   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   Ace Inhibitors    cough   Oxaprozin    GI upset    Pregabalin Swelling   SWELLING REACTION UNSPECIFIED    Amoxicillin-pot Clavulanate Nausea And Vomiting   gi upset Has patient had a PCN reaction causing immediate rash, facial/tongue/throat swelling, SOB or lightheadedness with hypotension: No Has patient had a PCN reaction causing severe rash involving mucus membranes or skin necrosis: No Has patient had a PCN reaction that required hospitalization No Has patient had a PCN reaction occurring within the last 10  years: No If all of the above answers are "NO", then may proceed with Cephalosporin use.      Medication List    STOP taking these medications   isosorbide mononitrate 60 MG 24 hr tablet Commonly known as: IMDUR     TAKE these medications    acetaminophen 500 MG tablet Commonly known as: TYLENOL Take 1,000 mg by mouth 2 (two) times daily as needed for moderate pain or headache.   atorvastatin 20 MG tablet Commonly known as: LIPITOR TAKE 1 TABLET BY MOUTH EVERY DAY   busPIRone 15 MG tablet Commonly known as: BUSPAR TAKE 1/2 TO 1 TABLET (7.5 TO 15 MG TOTAL) BY MOUTH 2 TIMES DAILY. What changed: See the new instructions.   cefdinir 300 MG capsule Commonly known as: OMNICEF Take 1 capsule (300 mg total) by mouth every 12 (twelve) hours for 4 days.   cetirizine 10 MG tablet Commonly known as: ZYRTEC Take 1 tablet (10 mg total) by mouth daily.   clopidogrel 75 MG tablet Commonly known as: PLAVIX Take 1 tablet (75 mg total) by mouth daily with breakfast.   colestipol 1 g tablet Commonly known as: Colestid Take 1 tablet (1 g total) by mouth 2 (two) times daily.   fluticasone 50 MCG/ACT nasal spray Commonly known as: FLONASE Place 2 sprays into both nostrils daily.   folic acid 1 MG tablet Commonly known as: FOLVITE Take 2 mg by mouth daily.   HYDROcodone-acetaminophen 5-325 MG tablet Commonly known as: NORCO/VICODIN Take 1-2 tablets by mouth every 4 (four) hours as needed for moderate pain or severe pain.   losartan-hydrochlorothiazide 50-12.5 MG tablet Commonly known as: HYZAAR TAKE 1 TABLET BY MOUTH EVERY DAY   Melatonin 3 MG Caps Take 9 mg by mouth at bedtime.   methotrexate 2.5 MG tablet Commonly known as: RHEUMATREX Take 6 tablets (15 mg total) by mouth once a week. (7 tablets)   metoprolol tartrate 25 MG tablet Commonly known as: LOPRESSOR TAKE 1 TABLET BY MOUTH TWICE A DAY   nitroGLYCERIN 0.4 MG SL tablet Commonly known as: Nitrostat Place 1 tablet (0.4 mg total) under the tongue every 5 (five) minutes as needed for chest pain.   pantoprazole 40 MG tablet Commonly known as: PROTONIX Take 1 tablet (40 mg total) by mouth daily.   raloxifene 60 MG tablet Commonly known as: EVISTA Take 1  tablet (60 mg total) by mouth daily.   Vitamin D (Cholecalciferol) 25 MCG (1000 UT) Caps Take 1 capsule by mouth daily.      Allergies  Allergen Reactions  . 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 Ace Inhibitors     cough  . Oxaprozin     GI upset   . Pregabalin Swelling    SWELLING REACTION UNSPECIFIED   . Amoxicillin-Pot Clavulanate Nausea And Vomiting    gi upset Has patient had a PCN reaction causing immediate rash, facial/tongue/throat swelling, SOB or lightheadedness with hypotension: No Has patient had a PCN reaction causing severe rash involving mucus membranes or skin necrosis: No Has patient had a PCN reaction that required hospitalization No Has patient had a PCN reaction occurring within the last 10 years: No If all of the above answers are "NO", then may proceed with Cephalosporin use.    Follow-up Information    Tower, [name], MD. Schedule an appointment as soon as possible for a visit in 1 week(s).   Specialties: Family Medicine, Radiology Contact information: [address]  [place] [address] [phone]        Leonie Man, MD .   Specialty: Cardiology Contact information: [address] [place] [address] [phone]            The results of significant diagnostics from this hospitalization (including imaging, microbiology, ancillary and laboratory) are listed below for reference.    Significant Diagnostic Studies: Ct Angio Chest Pe W Or Wo Contrast  Result Date: 06/19/2019 CLINICAL DATA:  78 year old female with shortness of breath, fever, and cough. EXAM: CT ANGIOGRAPHY CHEST WITH CONTRAST TECHNIQUE: Multidetector CT imaging of the chest was performed using the standard protocol during bolus administration of intravenous contrast. Multiplanar CT image reconstructions and MIPs were obtained to evaluate the vascular anatomy. CONTRAST:  14mL OMNIPAQUE IOHEXOL 350 MG/ML  SOLN COMPARISON:  Chest radiograph dated 06/18/2019 FINDINGS: Evaluation is limited due to streak artifact caused by left shoulder arthroplasty. Cardiovascular: There is mild cardiomegaly. No pericardial effusion. Multi vessel coronary vascular calcification. Moderate atherosclerotic calcification of the thoracic aorta. Evaluation of the pulmonary arteries is limited due to suboptimal opacification and timing of the contrast. No definite large or central pulmonary artery embolus identified. Mediastinum/Nodes: Mildly enlarged right hilar lymph node measures 15 mm. Prevascular lymph node measures approximately 11 mm in short axis. The esophagus is grossly unremarkable. No mediastinal fluid collection. Lungs/Pleura: Diffuse bilateral interstitial prominence and confluent airspace densities most consistent with edema. Pneumonia is not excluded. Clinical correlation is recommended. There is no pleural effusion or pneumothorax. The central airways are patent. Upper Abdomen: No acute abnormality. Musculoskeletal: Left shoulder arthroplasty. No acute osseous pathology. Review of the MIP images confirms the above findings. IMPRESSION: 1. No CT evidence of central pulmonary artery embolus. 2. Cardiomegaly with findings most consistent with CHF. Pneumonia is not excluded. Clinical correlation is recommended. Aortic Atherosclerosis (ICD10-I70.0). Electronically Signed   By: Anner Crete M.D.   On: 06/19/2019 04:02   Dg Chest Portable 1 View  Result Date: 06/18/2019 CLINICAL DATA:  Fever EXAM: PORTABLE CHEST 1 VIEW COMPARISON:  Radiograph 08/19/2016 FINDINGS: Streaky basilar atelectasis. No consolidation, features of edema, pneumothorax, or effusion. Pulmonary vascularity is normally distributed. The aorta is calcified. The remaining cardiomediastinal contours are unremarkable. No acute osseous or soft tissue abnormality. Left reverse shoulder arthroplasty. Degenerative changes are present in the imaged spine and right  shoulder. IMPRESSION: Streaky basilar atelectasis.  No consolidative process. Aortic Atherosclerosis (ICD10-I70.0). Electronically Signed   By: Lovena Le M.D.   On: 06/18/2019 22:21    Microbiology: Recent Results (from the past 240 hour(s))  Urine culture     Status: Abnormal   Collection Time: 06/18/19 10:32 PM   Specimen: In/Out Cath Urine  Result Value Ref Range Status   Specimen Description IN/OUT CATH URINE  Final   Special Requests   Final    NONE Performed at [facility], [address], [place], [place] [address]    Culture MULTIPLE SPECIES PRESENT, SUGGEST RECOLLECTION (A)  Final   Report Status 06/19/2019 FINAL  Final  Culture, blood (Routine x 2)     Status: None (Preliminary result)   Collection Time: 06/18/19 10:47 PM   Specimen: BLOOD  Result Value Ref Range Status   Specimen Description BLOOD BLOOD RIGHT FOREARM  Final   Special Requests   Final    BOTTLES DRAWN AEROBIC AND ANAEROBIC Blood Culture adequate volume   Culture   Final    NO GROWTH 3 DAYS Performed at [facility], [place] [address], [place], [place] [address]    Report Status PENDING  Incomplete  SARS CORONAVIRUS 2 (TAT 6-24 HRS) Nasopharyngeal Nasopharyngeal Swab     Status: None   Collection Time: 06/18/19 11:18 PM   Specimen: Nasopharyngeal Swab  Result Value Ref Range Status   SARS Coronavirus 2 NEGATIVE NEGATIVE Final    Comment: (NOTE) SARS-CoV-2 target nucleic acids are NOT DETECTED. The SARS-CoV-2 RNA is generally detectable in upper and lower respiratory specimens during the acute phase of infection. Negative results do not preclude SARS-CoV-2 infection, do not rule out co-infections with other pathogens, and should not be used as the sole basis for treatment or other patient management decisions. Negative results must be combined with clinical observations, patient history, and epidemiological information. The expected result is Negative. Fact Sheet for  Patients: SugarRoll.be Fact Sheet for Healthcare Providers: https://www.woods-mathews.com/ This test is not yet approved or cleared by the [place] FDA and  has been authorized for detection and/or diagnosis of SARS-CoV-2 by FDA under an Emergency Use Authorization (EUA). This EUA will remain  in effect (meaning this test can be used) for the duration of the COVID-19 declaration under Section 56 4(b)(1) of the Act, 21 U.S.C. section 360bbb-3(b)(1), unless the authorization is terminated or revoked sooner. Performed at [facility], [place] [address] [place], [place] [address]   Culture, blood (Routine x 2)     Status: None (Preliminary result)   Collection Time: 06/19/19  8:22 AM   Specimen: BLOOD RIGHT ARM  Result Value Ref Range Status   Specimen Description BLOOD RIGHT ARM  Final   Special Requests   Final    BOTTLES DRAWN AEROBIC AND ANAEROBIC Blood Culture adequate volume   Culture   Final    NO GROWTH 2 DAYS Performed at [facility], [place] [address], [place], [place] [address]    Report Status PENDING  Incomplete  Respiratory Panel by PCR     Status: None   Collection Time: 06/19/19  9:29 AM   Specimen: Nasopharyngeal Swab; Respiratory  Result Value Ref Range Status   Adenovirus NOT DETECTED NOT DETECTED Final   Coronavirus 229E NOT DETECTED NOT DETECTED Final    Comment: (NOTE) The Coronavirus on the Respiratory Panel, DOES NOT test for the novel  Coronavirus (2019 nCoV)    Coronavirus HKU1 NOT DETECTED NOT DETECTED Final   Coronavirus NL63 NOT DETECTED NOT DETECTED Final   Coronavirus OC43 NOT DETECTED NOT DETECTED Final   Metapneumovirus NOT DETECTED NOT DETECTED Final   Rhinovirus / Enterovirus NOT DETECTED NOT DETECTED Final   Influenza A NOT DETECTED NOT DETECTED Final   Influenza B NOT DETECTED NOT DETECTED Final   Parainfluenza Virus 1 NOT DETECTED NOT DETECTED Final   Parainfluenza Virus 2 NOT DETECTED  NOT DETECTED Final   Parainfluenza Virus 3 NOT DETECTED NOT DETECTED Final   Parainfluenza Virus 4 NOT DETECTED NOT DETECTED Final   Respiratory Syncytial Virus NOT DETECTED NOT DETECTED Final   Bordetella pertussis NOT DETECTED NOT DETECTED Final   Chlamydophila pneumoniae NOT DETECTED NOT DETECTED Final   Mycoplasma pneumoniae NOT DETECTED NOT DETECTED Final    Comment: Performed at [facility], [place]. [address], [place],  [address]     Labs: Basic Metabolic Panel: Recent Labs  Lab 06/18/19 2247 06/19/19 0702 06/21/19 0505  NA 129* 133* 136  K 3.2* 3.7 3.4*  CL 94* 100 100  CO2 20* 23 26  GLUCOSE 111*  95 89  BUN 20 17 8   CREATININE 1.29* 1.16* 1.02*  CALCIUM 8.2* 8.0* 8.4*  MG  --  1.4*  --    Liver Function Tests: Recent Labs  Lab 06/18/19 2247 06/21/19 0505  AST 26 27  ALT 11 10  ALKPHOS 52 43  BILITOT 0.7 0.3  PROT 5.6* 4.9*  ALBUMIN 2.7* 2.3*   No results for input(s): LIPASE, AMYLASE in the last 168 hours. No results for input(s): AMMONIA in the last 168 hours. CBC: Recent Labs  Lab 06/18/19 2247 06/19/19 0702 06/21/19 0505  WBC 8.4 7.2 6.8  NEUTROABS 6.4  --   --   HGB 9.7* 9.3* 8.7*  HCT 30.4* 29.4* 27.6*  MCV 92.4 93.9 93.2  PLT 242 205 270   Cardiac Enzymes: No results for input(s): CKTOTAL, CKMB, CKMBINDEX, TROPONINI in the last 168 hours. BNP: BNP (last 3 results) Recent Labs    06/19/19 0521  BNP 114.6*    ProBNP (last 3 results) No results for input(s): PROBNP in the last 8760 hours.  CBG: Recent Labs  Lab 06/20/19 0654 06/20/19 1154  GLUCAP 93 106*       Signed:  Domenic Polite MD.  [facility] 06/21/2019, 2:26 PM

## 2019-06-21 NOTE — Evaluation (Signed)
Physical Therapy Evaluation Patient Details Name: [name] [name] MRN: [id number] DOB: 23-Aug-1941 Today's Date: 06/21/2019   History of Present Illness  78yo female c/o fever, cough, malaise, SOB. Covid/respirtory panel negative. Admitted for sepsis, pneumonia, acute respiratory failure with hypoxia, UTI. PMH polymyalgia rheumatica, OA, HTN, MI, chronic DOE, LBP, CA, TKR, THA, reverse total shoulder, lumbar surgery, cardiac cath  Clinical Impression   Patient received in bed, very pleasant and cooperative with PT this morning. Able to complete bed mobility with S, functional transfers with S and RW, and gait approximately 72f with min guard and RW. Noted DOE, and Spo2 drop to 87% on room air with gait but able to recover to 96% with pursed lip breathing on room air, RN notified. She was left up in the chair with all needs met, chair alarm active this morning. She will continue to benefit from skilled PT services in the acute setting, currently recommend skilled HHPT services moving forward.     Follow Up Recommendations Home health PT;Supervision for mobility/OOB    Equipment Recommendations  None recommended by PT(has all necessary DME)    Recommendations for Other Services       Precautions / Restrictions Precautions Precautions: Other (comment);Fall Precaution Comments: watch vitals Restrictions Weight Bearing Restrictions: No      Mobility  Bed Mobility Overal bed mobility: Needs Assistance Bed Mobility: Supine to Sit     Supine to sit: Supervision     General bed mobility comments: S for safety, no physical assist given  Transfers Overall transfer level: Needs assistance Equipment used: Rolling walker (2 wheeled) Transfers: Sit to/from Stand Sit to Stand: Supervision         General transfer comment: S for safety, cues for hand placement, no physical assist given  Ambulation/Gait Ambulation/Gait assistance: Min guard Gait Distance (Feet): 30  Feet(10+210f Assistive device: Rolling walker (2 wheeled) Gait Pattern/deviations: Step-through pattern;Decreased step length - right;Decreased step length - left;Decreased stride length;Trunk flexed Gait velocity: decreased   General Gait Details: slow and steady at self selected pace with RW; SPO2 drop to 87% with longer gait distance but recovered to 96% on room air with pursed lip breathing  Stairs            Wheelchair Mobility    Modified Rankin (Stroke Patients Only)       Balance Overall balance assessment: Mild deficits observed, not formally tested                                           Pertinent Vitals/Pain Pain Assessment: 0-10 Pain Score: 6  Pain Location: hips and back, shoulder, neck Pain Descriptors / Indicators: Aching;Discomfort;Sore Pain Intervention(s): Limited activity within patient's tolerance;Monitored during session    HoJetxpects to be discharged to:: Private residence Living Arrangements: Other relatives(great-grandson lives with her) Available Help at Discharge: Family;Available PRN/intermittently Type of Home: House Home Access: Stairs to enter Entrance Stairs-Rails: Can reach both Entrance Stairs-Number of Steps: 3 Home Layout: One level Home Equipment: Bedside commode;Walker - 2 wheels;Cane - single point      Prior Function Level of Independence: Independent with assistive device(s)         Comments: works at her [employer]aily, no falls recently, no oxygen at home     Hand Dominance   Dominant Hand: Right    Extremity/Trunk Assessment   Upper Extremity Assessment Upper  Extremity Assessment: Generalized weakness    Lower Extremity Assessment Lower Extremity Assessment: Generalized weakness    Cervical / Trunk Assessment Cervical / Trunk Assessment: Kyphotic  Communication   Communication: No difficulties  Cognition Arousal/Alertness: Awake/alert Behavior  During Therapy: WFL for tasks assessed/performed Overall Cognitive Status: Within Functional Limits for tasks assessed                                        General Comments      Exercises     Assessment/Plan    PT Assessment Patient needs continued PT services  PT Problem List Decreased strength;Decreased activity tolerance;Decreased safety awareness;Decreased balance;Decreased mobility;Cardiopulmonary status limiting activity;Decreased coordination       PT Treatment Interventions DME instruction;Balance training;Gait training;Neuromuscular re-education;Stair training;Functional mobility training;Patient/family education;Therapeutic activities;Therapeutic exercise    PT Goals (Current goals can be found in the Care Plan section)  Acute Rehab PT Goals Patient Stated Goal: go home today PT Goal Formulation: With patient Time For Goal Achievement: 07/05/19 Potential to Achieve Goals: Good    Frequency Min 3X/week   Barriers to discharge        Co-evaluation               AM-PAC PT "6 Clicks" Mobility  Outcome Measure Help needed turning from your back to your side while in a flat bed without using bedrails?: None Help needed moving from lying on your back to sitting on the side of a flat bed without using bedrails?: None Help needed moving to and from a bed to a chair (including a wheelchair)?: A Little Help needed standing up from a chair using your arms (e.g., wheelchair or bedside chair)?: A Little Help needed to walk in hospital room?: A Little Help needed climbing 3-5 steps with a railing? : A Little 6 Click Score: 20    End of Session Equipment Utilized During Treatment: Gait belt Activity Tolerance: Patient tolerated treatment well Patient left: in chair;with call bell/phone within reach;with chair alarm set Nurse Communication: Mobility status;Other (comment)(SPO2 with mobility) PT Visit Diagnosis: Unsteadiness on feet  (R26.81);Difficulty in walking, not elsewhere classified (R26.2);Muscle weakness (generalized) (M62.81)    Time: 8588-5027 PT Time Calculation (min) (ACUTE ONLY): 38 min   Charges:   PT Evaluation $PT Eval Moderate Complexity: 1 Mod PT Treatments $Gait Training: 8-22 mins $Therapeutic Activity: 8-22 mins        Deniece Ree PT, DPT, CBIS  Supplemental Physical Therapist [place]    Pager [phone] Acute Rehab Office [phone]

## 2019-06-21 NOTE — Progress Notes (Signed)
[name] [name] to be discharged Home per MD order. Discussed prescriptions and follow up appointments with the patient. Prescriptions explained to patient; medication list explained in detail. Patient verbalized understanding.  Skin clean, dry and intact without evidence of skin break down, no evidence of skin tears noted. IV catheter discontinued intact. Site without signs and symptoms of complications. Dressing and pressure applied. Pt denies pain at the site currently. No complaints noted.  Patient free of lines, drains, and wounds.   An After Visit Summary (AVS) was printed and given to the patient. Patient escorted via wheelchair, and discharged home via private auto.  [name], RN

## 2019-06-21 NOTE — TOC Transition Note (Addendum)
Transition of Care [facility]) - CM/SW Discharge Note   Patient Details  Name: DOLOREZ JEFFREY MRN: [id number] Date of Birth: 1941/07/27  Transition of Care [facility]) CM/SW Contact:  Bartholomew Crews, RN Phone Number: [phone] 06/21/2019, 3:59 PM   Clinical Narrative:    Spoke with patient's daughter, Vonna Kotyk, on the phone to offer choice for [facility] agency. Referral placed to [facility] - pending. Patient transitioned home today.   Update: [facility] accepted referral and will follow up with patient. Left message on Judy's phone to advise of agency and agency contact information.          Patient Goals and CMS Choice        Discharge Placement                       Discharge Plan and Services                                     Social Determinants of Health (SDOH) Interventions     Readmission Risk Interventions No flowsheet data found.

## 2019-06-22 ENCOUNTER — Inpatient Hospital Stay: Payer: Medicare Other

## 2019-06-22 ENCOUNTER — Other Ambulatory Visit: Payer: Self-pay

## 2019-06-22 VITALS — BP 121/66 | HR 97 | Temp 98.2°F | Resp 18

## 2019-06-22 DIAGNOSIS — D638 Anemia in other chronic diseases classified elsewhere: Secondary | ICD-10-CM | POA: Diagnosis not present

## 2019-06-22 DIAGNOSIS — M353 Polymyalgia rheumatica: Secondary | ICD-10-CM | POA: Diagnosis not present

## 2019-06-22 DIAGNOSIS — Z79899 Other long term (current) drug therapy: Secondary | ICD-10-CM | POA: Diagnosis not present

## 2019-06-22 MED ORDER — EPOETIN ALFA-EPBX 40000 UNIT/ML IJ SOLN
40000.0000 [IU] | Freq: Once | INTRAMUSCULAR | Status: AC
  Administered 2019-06-22: 10:00:00 40000 [IU] via SUBCUTANEOUS

## 2019-06-22 MED ORDER — EPOETIN ALFA-EPBX 40000 UNIT/ML IJ SOLN
INTRAMUSCULAR | Status: AC
  Filled 2019-06-22: qty 1

## 2019-06-22 NOTE — Patient Instructions (Signed)

## 2019-06-22 NOTE — Progress Notes (Signed)
Okay to use CBC from 10/28 per MD.

## 2019-06-23 ENCOUNTER — Telehealth: Payer: Self-pay

## 2019-06-23 LAB — CULTURE, BLOOD (ROUTINE X 2)
Culture: NO GROWTH
Special Requests: ADEQUATE

## 2019-06-23 NOTE — Telephone Encounter (Signed)
Left message for patient to call back to complete TCM call and schedule an appointment

## 2019-06-24 LAB — CULTURE, BLOOD (ROUTINE X 2)
Culture: NO GROWTH
Special Requests: ADEQUATE

## 2019-06-26 NOTE — Telephone Encounter (Signed)
Patient returned call to the office.  Transition Care Management Follow-up Telephone Call   Date discharged? 06/21/19   How have you been since you were released from the hospital? Doing better, Home Health Nurse is coming.   Do you understand why you were in the hospital? YES   Do you understand the discharge instructions? YES   Where were you discharged to? HOME   Items Reviewed:  Medications reviewed: YES  Allergies reviewed: YES  Dietary changes reviewed: YES  Referrals reviewed: YES, continuing to see Hematologist and oncologist   Functional Questionnaire:   Activities of Daily Living (ADLs):   She states they are independent in the following: grooming, dressing, ambulation, bathing, feeding and going to the bathroom States they require assistance with the following: Nothing   Any transportation issues/concerns?: NO   Any patient concerns? NO   Confirmed importance and date/time of follow-up visits scheduled YES    Provider Appointment booked Dr. Glori Bickers 06/30/19 at 2:00 pm  Confirmed with patient if condition begins to worsen call PCP or go to the ER.  Patient was given the office number and encouraged to call back with question or concerns.  : YES

## 2019-06-29 ENCOUNTER — Inpatient Hospital Stay: Payer: Medicare Other

## 2019-06-29 ENCOUNTER — Other Ambulatory Visit: Payer: Self-pay

## 2019-06-29 ENCOUNTER — Telehealth: Payer: Self-pay | Admitting: Hematology and Oncology

## 2019-06-29 ENCOUNTER — Inpatient Hospital Stay: Payer: Medicare Other | Attending: Hematology and Oncology

## 2019-06-29 VITALS — BP 107/59 | HR 82 | Temp 98.2°F | Resp 18

## 2019-06-29 DIAGNOSIS — M353 Polymyalgia rheumatica: Secondary | ICD-10-CM | POA: Insufficient documentation

## 2019-06-29 DIAGNOSIS — Z79899 Other long term (current) drug therapy: Secondary | ICD-10-CM | POA: Diagnosis not present

## 2019-06-29 DIAGNOSIS — D638 Anemia in other chronic diseases classified elsewhere: Secondary | ICD-10-CM

## 2019-06-29 DIAGNOSIS — F419 Anxiety disorder, unspecified: Secondary | ICD-10-CM

## 2019-06-29 DIAGNOSIS — D539 Nutritional anemia, unspecified: Secondary | ICD-10-CM

## 2019-06-29 DIAGNOSIS — I252 Old myocardial infarction: Secondary | ICD-10-CM

## 2019-06-29 DIAGNOSIS — Z20828 Contact with and (suspected) exposure to other viral communicable diseases: Secondary | ICD-10-CM

## 2019-06-29 DIAGNOSIS — N390 Urinary tract infection, site not specified: Secondary | ICD-10-CM

## 2019-06-29 DIAGNOSIS — D649 Anemia, unspecified: Secondary | ICD-10-CM | POA: Diagnosis present

## 2019-06-29 DIAGNOSIS — F339 Major depressive disorder, recurrent, unspecified: Secondary | ICD-10-CM

## 2019-06-29 DIAGNOSIS — I25119 Atherosclerotic heart disease of native coronary artery with unspecified angina pectoris: Secondary | ICD-10-CM

## 2019-06-29 DIAGNOSIS — D509 Iron deficiency anemia, unspecified: Secondary | ICD-10-CM

## 2019-06-29 DIAGNOSIS — A419 Sepsis, unspecified organism: Secondary | ICD-10-CM

## 2019-06-29 LAB — CMP (CANCER CENTER ONLY)
ALT: 30 U/L (ref 0–44)
AST: 42 U/L — ABNORMAL HIGH (ref 15–41)
Albumin: 2.8 g/dL — ABNORMAL LOW (ref 3.5–5.0)
Alkaline Phosphatase: 76 U/L (ref 38–126)
Anion gap: 11 (ref 5–15)
BUN: 17 mg/dL (ref 8–23)
CO2: 26 mmol/L (ref 22–32)
Calcium: 8.9 mg/dL (ref 8.9–10.3)
Chloride: 101 mmol/L (ref 98–111)
Creatinine: 1.1 mg/dL — ABNORMAL HIGH (ref 0.44–1.00)
GFR, Est AFR Am: 55 mL/min — ABNORMAL LOW (ref >60)
GFR, Estimated: 48 mL/min — ABNORMAL LOW (ref >60)
Glucose, Bld: 99 mg/dL (ref 70–99)
Potassium: 4.2 mmol/L (ref 3.5–5.1)
Sodium: 138 mmol/L (ref 135–145)
Total Bilirubin: 0.5 mg/dL (ref 0.3–1.2)
Total Protein: 6.5 g/dL (ref 6.5–8.1)

## 2019-06-29 LAB — CBC WITH DIFFERENTIAL (CANCER CENTER ONLY)
Abs Immature Granulocytes: 0.14 10*3/uL — ABNORMAL HIGH (ref 0.00–0.07)
Basophils Absolute: 0.1 10*3/uL (ref 0.0–0.1)
Basophils Relative: 1 %
Eosinophils Absolute: 0.5 10*3/uL (ref 0.0–0.5)
Eosinophils Relative: 5 %
HCT: 32.9 % — ABNORMAL LOW (ref 36.0–46.0)
Hemoglobin: 9.9 g/dL — ABNORMAL LOW (ref 12.0–15.0)
Immature Granulocytes: 2 %
Lymphocytes Relative: 16 %
Lymphs Abs: 1.4 10*3/uL (ref 0.7–4.0)
MCH: 28.8 pg (ref 26.0–34.0)
MCHC: 30.1 g/dL (ref 30.0–36.0)
MCV: 95.6 fL (ref 80.0–100.0)
Monocytes Absolute: 0.7 10*3/uL (ref 0.1–1.0)
Monocytes Relative: 9 %
Neutro Abs: 5.8 10*3/uL (ref 1.7–7.7)
Neutrophils Relative %: 67 %
Platelet Count: 436 10*3/uL — ABNORMAL HIGH (ref 150–400)
RBC: 3.44 MIL/uL — ABNORMAL LOW (ref 3.87–5.11)
RDW: 15.2 % (ref 11.5–15.5)
WBC Count: 8.6 10*3/uL (ref 4.0–10.5)
nRBC: 0 % (ref 0.0–0.2)

## 2019-06-29 MED ORDER — EPOETIN ALFA-EPBX 40000 UNIT/ML IJ SOLN
40000.0000 [IU] | Freq: Once | INTRAMUSCULAR | Status: AC
  Administered 2019-06-29: 40000 [IU] via SUBCUTANEOUS

## 2019-06-29 MED ORDER — EPOETIN ALFA-EPBX 40000 UNIT/ML IJ SOLN
INTRAMUSCULAR | Status: AC
  Filled 2019-06-29: qty 1

## 2019-06-29 NOTE — Patient Instructions (Signed)

## 2019-06-29 NOTE — Telephone Encounter (Signed)
Scheduled appt per 11/5 sch message - pt aware of apt date and time

## 2019-06-30 ENCOUNTER — Encounter: Payer: Self-pay | Admitting: Family Medicine

## 2019-06-30 ENCOUNTER — Ambulatory Visit (INDEPENDENT_AMBULATORY_CARE_PROVIDER_SITE_OTHER): Payer: Medicare Other | Admitting: Family Medicine

## 2019-06-30 VITALS — BP 114/72 | HR 81 | Temp 97.4°F | Ht 64.5 in | Wt 158.4 lb

## 2019-06-30 DIAGNOSIS — Z23 Encounter for immunization: Secondary | ICD-10-CM

## 2019-06-30 DIAGNOSIS — J189 Pneumonia, unspecified organism: Secondary | ICD-10-CM | POA: Diagnosis not present

## 2019-06-30 DIAGNOSIS — I10 Essential (primary) hypertension: Secondary | ICD-10-CM

## 2019-06-30 NOTE — Patient Instructions (Addendum)
I'm glad you are starting to feel better  Gradually increase your exercise  Eat regular meals and keep up fluids  We will watch your blood pressure   We need a chest xray in approx 6 weeks   Flu shot today

## 2019-06-30 NOTE — Assessment & Plan Note (Addendum)
With sepsis  Much improved with tx after hosp  Reviewed hospital records, lab results and studies in detail   Done with abx/home now  No longer hypoxic Home care is coming in  No cough or sob  Gradually returning to reg activity  cxr scheduled for re check at 6 wk

## 2019-06-30 NOTE — Progress Notes (Signed)
Subjective:    Patient ID: [name] [name], female    DOB: Jun 06, 1941, 78 y.o.   MRN: [id number]  HPI Her for f/u of hospitalization from 10/25 to 10/28 for CAP with sepsis and hypoxia   TCM call ref from 10/30   Hospital Course:   Sepsis secondary to community-acquired pneumonia -Patient is admitted with dyspnea, cough, fever of 101.2 -Imaging noted diffuse interstitial prominence with confluent airspace opacities, COVID-19 PCR is negative, -Clinically improved with IV ceftriaxone and azithromycin -Influenza and respiratory virus panel was also negative -Discharged home on oral antibiotics for 4 more days to complete therapy  Unsure where she got the pneumonia    She was d/c on cefdinir 300 mg q 12 h for 4 days  Blood cultures returned neg   inst to consider f/u cxr in 6 weeks    Acute respiratory failure with hypoxia -Secondary to pneumonia -Improved, weaned off O2  Now no longer on 02 at home No cough Not sob -just feels weak   Hyponatremia -Resolved with hydration  Coronary artery disease -Stable -continue Plavix and metoprolol  Essential hypertension -Resumed losartan/HCTZ and metoprolol -Held Imdur because of softer BP  conitnues to hold the imdur BP: 114/72  Has not felt light headed   Lab Results  Component Value Date   CREATININE 1.10 (H) 06/29/2019   BUN 17 06/29/2019   NA 138 06/29/2019   K 4.2 06/29/2019   CL 101 06/29/2019   CO2 26 06/29/2019   Lab Results  Component Value Date   ALT 30 06/29/2019   AST 42 (H) 06/29/2019   ALKPHOS 76 06/29/2019   BILITOT 0.5 06/29/2019   Lab Results  Component Value Date   WBC 8.6 06/29/2019   HGB 9.9 (L) 06/29/2019   HCT 32.9 (L) 06/29/2019   MCV 95.6 06/29/2019   PLT 436 (H) 06/29/2019   had her shot for anemia right after d/c    Here today Wt Readings from Last 3 Encounters:  06/30/19 158 lb 7 oz (71.9 kg)  06/16/19 165 lb (74.8 kg)  05/19/19 169 lb (76.7 kg)   26.78  kg/m  Pulse ox 97% on RA  BP Readings from Last 3 Encounters:  06/30/19 114/72  06/29/19 (!) 107/59  06/22/19 121/66  still holding imdur   Pulse Readings from Last 3 Encounters:  06/30/19 81  06/29/19 82  06/22/19 97   Temp: (!) 97.4 F (36.3 C)  She still feels somewhat tired and weak     Moving more- gradually improving Home health continues to come and check in  Getting back to normal activity gradually   Patient Active Problem List   Diagnosis Date Noted   CAP (community acquired pneumonia) 06/30/2019   UTI (urinary tract infection) 06/19/2019   Hyponatremia 06/19/2019   Pedal edema 10/04/2018   Grief reaction 09/17/2018   S/P shoulder replacement, left 12/17/2017   Routine general medical examination at a health care facility 10/14/2017   Preop cardiovascular exam 10/01/2017   CAD S/P percutaneous coronary angioplasty 01/27/2017   Aortic regurgitation 01/27/2017   Dyspnea on exertion 08/19/2016   Left bundle branch block (LBBB) on electrocardiogram 08/19/2016   Estrogen deficiency 10/08/2015   Chronic fatigue 08/20/2015   Deficiency anemia 11/17/2013   Encounter for Medicare annual wellness exam 07/17/2013   PMR (polymyalgia rheumatica) ([place]) 07/17/2013   Intertrigo 04/19/2013   Expected blood loss anemia 04/05/2013   Overweight (BMI 25.0-29.9) 04/05/2013   S/P right THA, AA 04/04/2013   IBS (  irritable bowel syndrome) 05/30/2012   Anemia of chronic disease 04/16/2010   Generalized anxiety disorder 04/16/2010   GERD 03/04/2009   Vitamin D deficiency 03/12/2008   URINARY TRACT INFECTION, RECURRENT 08/07/2007   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 Moderate ostial and mD2.  LAD tandem 50% stenoses w/ negative FFR (0.86)   Cancer (HCC)    skin cancer on leg   Chronic lower back pain    Complication of anesthesia 1980 and 1982   trouble waking up after breast biopsy, many surgeries since no problem   Depression    Dyspnea on effort - CHRONIC    partial relief of Sx post PCI of L Circumflex.   Fibrocystic breast    GERD (gastroesophageal reflux disease)    Headache    "monthly" (09/11/2016)   History of blood transfusion    "several since age 5; I've had them after most of my surgeries" (09/11/2016)   History of myocardial infarction    "doctor said there are signs I've had a heart attack; don't know when"  - Myoview [name] 2018 indicated small Inferior - Inferoseptal Infarct (vs. rest ischemia). Echo with inferoseptal hypokinesis.   Hx of skin cancer, basal cell 2013 and 2012   right inner, lower leg; several scattered around my body   Hypertension    Iron deficiency anemia    "I've had an iron infusion"   Osteoarthritis    Osteopenia    Peptic ulcer disease    H pylori   PMR (polymyalgia rheumatica) (HCC)    PONV (postoperative nausea and vomiting)    Psoriasis    Past Surgical History:  Procedure Laterality Date   APPENDECTOMY     BACK SURGERY     x 6    BREAST BIOPSY Left 1980 and 1982   "benign"   CARDIAC CATHETERIZATION N/A 09/11/2016   Procedure: Left Heart Cath and Coronary Angiography;  Surgeon: Leonie Man, MD;  Location: [facility];  Service: Cardiovascular: mLCx 90% * (PCI). Ostial D1 70% (not PTCA target). Mod ostial and mD2.  LAD tandem 50% stenoses w/ negative FFR (0.86)   CARDIAC  CATHETERIZATION N/A 09/11/2016   Procedure: Intravascular Pressure Wire/FFR Study;  Surgeon: Leonie Man, MD;  Location: [facility];  Service: Cardiovascular: --  LAD tandem 50% stenoses w/ negative FFR (0.86   CARDIAC CATHETERIZATION N/A 09/11/2016   Procedure: Coronary Stent Intervention;  Surgeon: Leonie Man, MD;  Location: [facility];  Service: Cardiovascular: -- DES PCI mLCx 90%--0% SYNERGY DES 2.75 MM X 16 MM.   CARDIAC CATHETERIZATION  2001   non-obstructive CAD   CATARACT EXTRACTION W/ INTRAOCULAR LENS  IMPLANT, BILATERAL  July -  August 2017   DILATION AND CURETTAGE OF UTERUS  X 3   ESOPHAGOGASTRODUODENOSCOPY  03/2009   ulcer,HP, GERD stricture   EXCISIONAL HEMORRHOIDECTOMY  HIP ARTHROPLASTY Left 2010   JOINT REPLACEMENT     KNEE ARTHROSCOPY Bilateral    LAPAROSCOPIC CHOLECYSTECTOMY  2005   LUMBAR [place] SURGERY  11/1984; 1987; 1989; 2004; 2005 X 2   deg. disk lumber spine    MOHS SURGERY Right    "inside my lower leg"   NASAL SINUS SURGERY  1970s   NM MYOVIEW LTD  08/28/2016   INTERMEDIATE RISK (b/c reduced EF & ? small infarct) -- EF ~35-40%.  ? Prior Small Inferior-apical & septal infarct (w/o ischemia).  Difuse HK - worse in inferoapex.  Suggest prior infarct with no ischemia. Ischemic area correlates with echo WMA   REVERSE SHOULDER ARTHROPLASTY Left 12/17/2017   Procedure: LEFT REVERSE SHOULDER ARTHROPLASTY;  Surgeon: Netta Cedars, MD;  Location: [facility];  Service: Orthopedics;  Laterality: Left;   TOTAL HIP ARTHROPLASTY Right 04/04/2013   Procedure: RIGHT TOTAL HIP ARTHROPLASTY ANTERIOR APPROACH;  Surgeon: Mauri Pole, MD;  Location: [facility];  Service: Orthopedics;  Laterality: Right;   TOTAL KNEE ARTHROPLASTY Right 2009   TRANSTHORACIC ECHOCARDIOGRAM  08/2016   mild conc LVH. EF 45-50% - anteroseptal & inferoseptal HK. Mod AI.   TRANSTHORACIC ECHOCARDIOGRAM  08/2017   (post PCI) --> EF normalized:  Normal LV systolic function;  mild diastolic dysfunction; mild   LVH; sclerotic aortic valve with mild AI.  No RWMA   TUBAL LIGATION     VAGINAL HYSTERECTOMY  1970s   partial    Social History   Tobacco Use   Smoking status: Never Smoker   Smokeless tobacco: Never Used  Substance Use Topics   Alcohol use: Yes    Alcohol/week: 0.0 standard drinks    Comment: 09/11/2016 "might have a margarita q 3-4 months"   Drug use: No   Family History  Problem Relation Age of Onset   Arthritis Mother    Emphysema Mother    Hyperlipidemia Mother    Hypertension Mother    Heart disease Mother    Heart disease Brother        CAD   Diabetes Paternal Grandmother    Heart disease Maternal Aunt    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   Ace Inhibitors     cough   Oxaprozin     GI upset    Pregabalin Swelling    SWELLING REACTION UNSPECIFIED    Amoxicillin-Pot Clavulanate Nausea And Vomiting    gi upset Has patient had a PCN reaction causing immediate rash, facial/tongue/throat swelling, SOB or lightheadedness with hypotension: No Has patient had a PCN reaction causing severe rash involving mucus membranes or skin necrosis: No Has patient had a PCN reaction that required hospitalization No Has patient had a PCN reaction occurring within the last 10 years: No If all of the above answers are "NO", then may proceed with Cephalosporin use.    Current Outpatient Medications on File Prior to Visit  Medication Sig Dispense Refill   acetaminophen (TYLENOL) 500 MG tablet Take 1,000 mg by mouth 2 (two) times daily as needed for moderate pain or headache.      atorvastatin (LIPITOR) 20 MG tablet TAKE 1 TABLET BY MOUTH EVERY DAY 90 tablet 1   busPIRone (BUSPAR) 15 MG tablet TAKE 1/2 TO 1 TABLET (7.5 TO 15 MG TOTAL) BY MOUTH 2 TIMES DAILY. (Patient taking differently: Take 15 mg by mouth daily. ) 180 tablet 2   cetirizine (ZYRTEC) 10 MG  tablet  Take 1 tablet (10 mg total) by mouth daily. 30 tablet 11   clopidogrel (PLAVIX) 75 MG tablet Take 1 tablet (75 mg total) by mouth daily with breakfast. 90 tablet 3   colestipol (COLESTID) 1 g tablet Take 1 tablet (1 g total) by mouth 2 (two) times daily. 60 tablet 3   fluticasone (FLONASE) 50 MCG/ACT nasal spray Place 2 sprays into both nostrils daily. 16 g 6   folic acid (FOLVITE) 1 MG tablet Take 2 mg by mouth daily.      HYDROcodone-acetaminophen (NORCO/VICODIN) 5-325 MG tablet Take 1-2 tablets by mouth every 4 (four) hours as needed for moderate pain or severe pain. 30 tablet 0   losartan-hydrochlorothiazide (HYZAAR) 50-12.5 MG tablet TAKE 1 TABLET BY MOUTH EVERY DAY 90 tablet 1   Melatonin 3 MG CAPS Take 9 mg by mouth at bedtime.      methotrexate (RHEUMATREX) 2.5 MG tablet Take 6 tablets (15 mg total) by mouth once a week. (7 tablets) 4 tablet    metoprolol tartrate (LOPRESSOR) 25 MG tablet TAKE 1 TABLET BY MOUTH TWICE A DAY 180 tablet 1   pantoprazole (PROTONIX) 40 MG tablet Take 1 tablet (40 mg total) by mouth daily. 90 tablet 1   raloxifene (EVISTA) 60 MG tablet Take 1 tablet (60 mg total) by mouth daily. 90 tablet 3   Vitamin D, Cholecalciferol, 25 MCG (1000 UT) CAPS Take 1 capsule by mouth daily.     isosorbide mononitrate (IMDUR) 60 MG 24 hr tablet Take 60 mg by mouth daily.     nitroGLYCERIN (NITROSTAT) 0.4 MG SL tablet Place 1 tablet (0.4 mg total) under the tongue every 5 (five) minutes as needed for chest pain. 25 tablet 5   No current facility-administered medications on file prior to visit.     Review of Systems  Constitutional: Positive for fatigue. Negative for activity change, appetite change, fever and unexpected weight change.  HENT: Negative for congestion, ear pain, rhinorrhea, sinus pressure and sore throat.   Eyes: Negative for pain, redness and visual disturbance.  Respiratory: Negative for cough, chest tightness, shortness of breath, wheezing  and stridor.   Cardiovascular: Negative for chest pain and palpitations.  Gastrointestinal: Negative for abdominal pain, blood in stool, constipation and diarrhea.  Endocrine: Negative for polydipsia and polyuria.  Genitourinary: Negative for dysuria, frequency and urgency.  Musculoskeletal: Negative for arthralgias, back pain and myalgias.  Skin: Negative for pallor and rash.  Allergic/Immunologic: Negative for environmental allergies.  Neurological: Negative for dizziness, syncope and headaches.       Generalized weakness-improving Nothing focal  Hematological: Negative for adenopathy. Does not bruise/bleed easily.  Psychiatric/Behavioral: Negative for decreased concentration and dysphoric mood. The patient is not nervous/anxious.        Objective:   Physical Exam Constitutional:      General: She is not in acute distress.    Appearance: Normal appearance. She is well-developed and normal weight. She is not ill-appearing or diaphoretic.     Comments: Seems mildly fatigued In good spirits  HENT:     Head: Normocephalic and atraumatic.     Mouth/Throat:     Mouth: Mucous membranes are moist.  Eyes:     General: No scleral icterus.       Right eye: No discharge.        Left eye: No discharge.     Conjunctiva/sclera: Conjunctivae normal.     Pupils: Pupils are equal, round, and reactive to light.  Neck:  Musculoskeletal: Normal range of motion and neck supple. No muscular tenderness.     Thyroid: No thyromegaly.     Vascular: No carotid bruit or JVD.  Cardiovascular:     Rate and Rhythm: Normal rate and regular rhythm.     Heart sounds: Murmur present. No gallop.   Pulmonary:     Effort: Pulmonary effort is normal. No respiratory distress.     Breath sounds: Normal breath sounds. No stridor. No wheezing, rhonchi or rales.     Comments: Good air exch bs are slt distant Chest:     Chest wall: No tenderness.  Abdominal:     General: Bowel sounds are normal. There is no  distension or abdominal bruit.     Palpations: Abdomen is soft. There is no mass.     Tenderness: There is no abdominal tenderness.  Musculoskeletal:     Right lower leg: No edema.     Left lower leg: No edema.  Lymphadenopathy:     Cervical: No cervical adenopathy.  Skin:    General: Skin is warm and dry.     Coloration: Skin is not pale.     Findings: No rash.  Neurological:     Mental Status: She is alert. Mental status is at baseline.     Deep Tendon Reflexes: Reflexes are normal and symmetric.  Psychiatric:        Mood and Affect: Mood normal.     Comments: Mood is good Spirits are up             Assessment & Plan:   Problem List Items Addressed This Visit      Cardiovascular and Mediastinum   Essential hypertension (Chronic)    Reviewed hospital records, lab results and studies in detail   bp in fair control at this time  BP Readings from Last 1 Encounters:  06/30/19 114/72   No changes needed Will continue to hold imdur for now  Not light headed and returning to regular activity Most recent labs reviewed  Disc lifstyle change with low sodium diet and exercise          Respiratory   CAP (community acquired pneumonia) - Primary    With sepsis  Much improved with tx after hosp  Reviewed hospital records, lab results and studies in detail   Done with abx/home now  No longer hypoxic Home care is coming in  No cough or sob  Gradually returning to reg activity  cxr scheduled for re check at 6 wk        Other Visit Diagnoses    Need for influenza vaccination       Relevant Orders   Flu Vaccine QUAD High Dose(Fluad) (Completed)

## 2019-07-02 NOTE — Assessment & Plan Note (Signed)
Reviewed hospital records, lab results and studies in detail   bp in fair control at this time  BP Readings from Last 1 Encounters:  06/30/19 114/72   No changes needed Will continue to hold imdur for now  Not light headed and returning to regular activity Most recent labs reviewed  Disc lifstyle change with low sodium diet and exercise

## 2019-07-05 ENCOUNTER — Other Ambulatory Visit: Payer: Self-pay

## 2019-07-05 ENCOUNTER — Telehealth: Payer: Self-pay | Admitting: Hematology and Oncology

## 2019-07-05 DIAGNOSIS — D638 Anemia in other chronic diseases classified elsewhere: Secondary | ICD-10-CM

## 2019-07-05 NOTE — Telephone Encounter (Signed)
Returned patient's phone call regarding cancelling 11/12 appointment, per patient's request appointment has been cancelled. °

## 2019-07-06 ENCOUNTER — Inpatient Hospital Stay: Payer: Medicare Other

## 2019-07-12 NOTE — Progress Notes (Signed)
Patient Care Team: Tower, [name], MD as PCP - General Ellyn Hack Leonie Green, MD as PCP - Cardiology (Cardiology) Suella Broad, MD as Consulting Physician (Physical Medicine and Rehabilitation) Gavin Pound, MD as Consulting Physician (Rheumatology) Haverstock, Jennefer Bravo, MD as Referring Physician (Dermatology) Nicholas Lose, MD as Consulting Physician (Hematology and Oncology)  DIAGNOSIS:    ICD-10-CM   1. Anemia of chronic disease  D63.8     CHIEF COMPLIANT: Follow-up of anemia  INTERVAL HISTORY: [name] [name] is a 78 y.o. with above-mentioned history of anemia who receives Retacrit. She was admitted to [facility] from 10/25-10/28 for sepsis. She presents to the clinic today for follow-up.    She was in the hospital with UTI and pneumonia and sepsis and was treated with antibiotics.  She was able to be discharged home.  She came in subsequently 2 weeks for injections.  Today her hemoglobin is much better to 10.3.  REVIEW OF SYSTEMS:   Constitutional: Denies fevers, chills or abnormal weight loss Eyes: Denies blurriness of vision Ears, nose, mouth, throat, and face: Denies mucositis or sore throat Respiratory: Denies cough, dyspnea or wheezes Cardiovascular: Denies palpitation, chest discomfort Gastrointestinal: Denies nausea, heartburn or change in bowel habits Skin: Denies abnormal skin rashes Lymphatics: Denies new lymphadenopathy or easy bruising Neurological: Denies numbness, tingling or new weaknesses Behavioral/Psych: Mood is stable, no new changes  Extremities: No lower extremity edema Breast: denies any pain or lumps or nodules in either breasts All other systems were reviewed with the patient and are negative.  I have reviewed the past medical history, past surgical history, social history and family history with the patient and they are unchanged from previous note.  ALLERGIES:  is allergic to sulfonamide derivatives; ace inhibitors; oxaprozin; pregabalin; and  amoxicillin-pot clavulanate.  MEDICATIONS:  Current Outpatient Medications  Medication Sig Dispense Refill  . acetaminophen (TYLENOL) 500 MG tablet Take 1,000 mg by mouth 2 (two) times daily as needed for moderate pain or headache.     Marland Kitchen atorvastatin (LIPITOR) 20 MG tablet TAKE 1 TABLET BY MOUTH EVERY DAY 90 tablet 1  . busPIRone (BUSPAR) 15 MG tablet TAKE 1/2 TO 1 TABLET (7.5 TO 15 MG TOTAL) BY MOUTH 2 TIMES DAILY. (Patient taking differently: Take 15 mg by mouth daily. ) 180 tablet 2  . cetirizine (ZYRTEC) 10 MG tablet Take 1 tablet (10 mg total) by mouth daily. 30 tablet 11  . clopidogrel (PLAVIX) 75 MG tablet Take 1 tablet (75 mg total) by mouth daily with breakfast. 90 tablet 3  . colestipol (COLESTID) 1 g tablet Take 1 tablet (1 g total) by mouth 2 (two) times daily. 60 tablet 3  . fluticasone (FLONASE) 50 MCG/ACT nasal spray Place 2 sprays into both nostrils daily. 16 g 6  . folic acid (FOLVITE) 1 MG tablet Take 2 mg by mouth daily.     Marland Kitchen HYDROcodone-acetaminophen (NORCO/VICODIN) 5-325 MG tablet Take 1-2 tablets by mouth every 4 (four) hours as needed for moderate pain or severe pain. 30 tablet 0  . isosorbide mononitrate (IMDUR) 60 MG 24 hr tablet Take 60 mg by mouth daily.    Marland Kitchen losartan-hydrochlorothiazide (HYZAAR) 50-12.5 MG tablet TAKE 1 TABLET BY MOUTH EVERY DAY 90 tablet 1  . Melatonin 3 MG CAPS Take 9 mg by mouth at bedtime.     . methotrexate (RHEUMATREX) 2.5 MG tablet Take 6 tablets (15 mg total) by mouth once a week. (7 tablets) 4 tablet   . metoprolol tartrate (LOPRESSOR) 25 MG  tablet TAKE 1 TABLET BY MOUTH TWICE A DAY 180 tablet 1  . nitroGLYCERIN (NITROSTAT) 0.4 MG SL tablet Place 1 tablet (0.4 mg total) under the tongue every 5 (five) minutes as needed for chest pain. 25 tablet 5  . pantoprazole (PROTONIX) 40 MG tablet Take 1 tablet (40 mg total) by mouth daily. 90 tablet 1  . raloxifene (EVISTA) 60 MG tablet Take 1 tablet (60 mg total) by mouth daily. 90 tablet 3  .  Vitamin D, Cholecalciferol, 25 MCG (1000 UT) CAPS Take 1 capsule by mouth daily.     No current facility-administered medications for this visit.     PHYSICAL EXAMINATION: ECOG PERFORMANCE STATUS: 1 - Symptomatic but completely ambulatory  Vitals:   07/13/19 1056  BP: 128/64  Pulse: 76  Resp: 17  Temp: 98.2 F (36.8 C)  SpO2: 99%   Filed Weights   07/13/19 1056  Weight: 158 lb 8 oz (71.9 kg)    GENERAL: alert, no distress and comfortable SKIN: skin color, texture, turgor are normal, no rashes or significant lesions EYES: normal, Conjunctiva are pink and non-injected, sclera clear OROPHARYNX: no exudate, no erythema and lips, buccal mucosa, and tongue normal  NECK: supple, thyroid normal size, non-tender, without nodularity LYMPH: no palpable lymphadenopathy in the cervical, axillary or inguinal LUNGS: clear to auscultation and percussion with normal breathing effort HEART: regular rate & rhythm and no murmurs and no lower extremity edema ABDOMEN: abdomen soft, non-tender and normal bowel sounds MUSCULOSKELETAL: no cyanosis of digits and no clubbing  NEURO: alert & oriented x 3 with fluent speech, no focal motor/sensory deficits EXTREMITIES: No lower extremity edema  LABORATORY DATA:  I have reviewed the data as listed CMP Latest Ref Rng & Units 06/29/2019 06/21/2019 06/19/2019  Glucose 70 - 99 mg/dL 99 89 95  BUN 8 - 23 mg/dL 17 8 17   Creatinine 0.44 - 1.00 mg/dL 1.10(H) 1.02(H) 1.16(H)  Sodium 135 - 145 mmol/L 138 136 133(L)  Potassium 3.5 - 5.1 mmol/L 4.2 3.4(L) 3.7  Chloride 98 - 111 mmol/L 101 100 100  CO2 22 - 32 mmol/L 26 26 23   Calcium 8.9 - 10.3 mg/dL 8.9 8.4(L) 8.0(L)  Total Protein 6.5 - 8.1 g/dL 6.5 4.9(L) -  Total Bilirubin 0.3 - 1.2 mg/dL 0.5 0.3 -  Alkaline Phos 38 - 126 U/L 76 43 -  AST 15 - 41 U/L 42(H) 27 -  ALT 0 - 44 U/L 30 10 -    Lab Results  Component Value Date   WBC 5.7 07/13/2019   HGB 10.3 (L) 07/13/2019   HCT 34.6 (L) 07/13/2019    MCV 98.0 07/13/2019   PLT 218 07/13/2019   NEUTROABS 2.8 07/13/2019    ASSESSMENT & PLAN:  Anemia of chronic disease 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 ( anemia of chronic inflammation), patient has chronic polymyalgia rheumatica I reviewed her blood counts with her. 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  Lab review  10/12/2017: Hemoglobin 10.2, MCV 94, normal WBC and platelet counts, creatinine 1.02 04/13/2019:Hemoglobin 9 05/19/2019: Hemoglobin 9.1 07/13/2019: Hemoglobin 10.3  Current treatment: Retacrit 40,000 units we will switch her to every 2-week injections. I recommended that she receive Retacrit injection with a hemoglobin of 10.3 today.  I will see her back in 6 weeks with a CBC and injection appointments.    No orders of the defined types were placed in this encounter.  The patient has a good understanding of the overall plan. she agrees with it. she  will call with any problems that may develop before the next visit here.  Nicholas Lose, MD 07/13/2019  Julious Oka Dorshimer, am acting as scribe for Dr. Nicholas Lose.  I have reviewed the above documentation for accuracy and completeness, and I agree with the above.

## 2019-07-13 ENCOUNTER — Inpatient Hospital Stay: Payer: Medicare Other

## 2019-07-13 ENCOUNTER — Inpatient Hospital Stay (HOSPITAL_BASED_OUTPATIENT_CLINIC_OR_DEPARTMENT_OTHER): Payer: Medicare Other | Admitting: Hematology and Oncology

## 2019-07-13 ENCOUNTER — Other Ambulatory Visit: Payer: Self-pay

## 2019-07-13 DIAGNOSIS — D638 Anemia in other chronic diseases classified elsewhere: Secondary | ICD-10-CM

## 2019-07-13 DIAGNOSIS — D649 Anemia, unspecified: Secondary | ICD-10-CM | POA: Diagnosis not present

## 2019-07-13 LAB — CMP (CANCER CENTER ONLY)
ALT: 10 U/L (ref 0–44)
AST: 18 U/L (ref 15–41)
Albumin: 3.3 g/dL — ABNORMAL LOW (ref 3.5–5.0)
Alkaline Phosphatase: 56 U/L (ref 38–126)
Anion gap: 9 (ref 5–15)
BUN: 22 mg/dL (ref 8–23)
CO2: 23 mmol/L (ref 22–32)
Calcium: 9 mg/dL (ref 8.9–10.3)
Chloride: 107 mmol/L (ref 98–111)
Creatinine: 1 mg/dL (ref 0.44–1.00)
GFR, Est AFR Am: >60 mL/min (ref >60)
GFR, Estimated: 54 mL/min — ABNORMAL LOW (ref >60)
Glucose, Bld: 88 mg/dL (ref 70–99)
Potassium: 4 mmol/L (ref 3.5–5.1)
Sodium: 139 mmol/L (ref 135–145)
Total Bilirubin: 0.4 mg/dL (ref 0.3–1.2)
Total Protein: 6.9 g/dL (ref 6.5–8.1)

## 2019-07-13 LAB — CBC WITH DIFFERENTIAL (CANCER CENTER ONLY)
Abs Immature Granulocytes: 0.03 10*3/uL (ref 0.00–0.07)
Basophils Absolute: 0.1 10*3/uL (ref 0.0–0.1)
Basophils Relative: 1 %
Eosinophils Absolute: 0.3 10*3/uL (ref 0.0–0.5)
Eosinophils Relative: 5 %
HCT: 34.6 % — ABNORMAL LOW (ref 36.0–46.0)
Hemoglobin: 10.3 g/dL — ABNORMAL LOW (ref 12.0–15.0)
Immature Granulocytes: 1 %
Lymphocytes Relative: 34 %
Lymphs Abs: 1.9 10*3/uL (ref 0.7–4.0)
MCH: 29.2 pg (ref 26.0–34.0)
MCHC: 29.8 g/dL — ABNORMAL LOW (ref 30.0–36.0)
MCV: 98 fL (ref 80.0–100.0)
Monocytes Absolute: 0.7 10*3/uL (ref 0.1–1.0)
Monocytes Relative: 12 %
Neutro Abs: 2.8 10*3/uL (ref 1.7–7.7)
Neutrophils Relative %: 47 %
Platelet Count: 218 10*3/uL (ref 150–400)
RBC: 3.53 MIL/uL — ABNORMAL LOW (ref 3.87–5.11)
RDW: 15.5 % (ref 11.5–15.5)
WBC Count: 5.7 10*3/uL (ref 4.0–10.5)
nRBC: 0 % (ref 0.0–0.2)

## 2019-07-13 MED ORDER — EPOETIN ALFA-EPBX 40000 UNIT/ML IJ SOLN
40000.0000 [IU] | Freq: Once | INTRAMUSCULAR | Status: AC
  Administered 2019-07-13: 40000 [IU] via SUBCUTANEOUS

## 2019-07-13 MED ORDER — EPOETIN ALFA-EPBX 40000 UNIT/ML IJ SOLN
INTRAMUSCULAR | Status: AC
  Filled 2019-07-13: qty 1

## 2019-07-13 NOTE — Progress Notes (Signed)
Proceed with Retacrit today with Hg 10.3 per MD note.  Hardie Pulley, PharmD, BCPS, BCOP

## 2019-07-13 NOTE — Patient Instructions (Signed)

## 2019-07-13 NOTE — Assessment & Plan Note (Signed)
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 ( anemia of chronic inflammation), patient has chronic polymyalgia rheumatica I reviewed her blood counts with her. 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  Lab review  10/12/2017: Hemoglobin 10.2, MCV 94, normal WBC and platelet counts, creatinine 1.02 04/13/2019:Hemoglobin 9 05/19/2019: Hemoglobin 9.1  Current treatment: Retacrit 40,000 units we will make her appointments to be given weekly for the next 4 weeks.  The goal of treatment is to keep hemoglobin greater than 10.   I will see her back in 1 month with a CBC and injection appointments.

## 2019-07-14 ENCOUNTER — Telehealth: Payer: Self-pay | Admitting: Hematology and Oncology

## 2019-07-14 NOTE — Telephone Encounter (Signed)
Called and spoke with patient.confirmed upcoming appts

## 2019-07-21 ENCOUNTER — Other Ambulatory Visit: Payer: Medicare Other

## 2019-07-21 ENCOUNTER — Ambulatory Visit: Payer: Medicare Other

## 2019-07-26 ENCOUNTER — Other Ambulatory Visit: Payer: Self-pay | Admitting: *Deleted

## 2019-07-26 DIAGNOSIS — D638 Anemia in other chronic diseases classified elsewhere: Secondary | ICD-10-CM

## 2019-07-27 ENCOUNTER — Inpatient Hospital Stay: Payer: Medicare Other

## 2019-07-27 ENCOUNTER — Other Ambulatory Visit: Payer: Self-pay

## 2019-07-27 ENCOUNTER — Inpatient Hospital Stay: Payer: Medicare Other | Attending: Hematology and Oncology

## 2019-07-27 VITALS — BP 112/62 | HR 72 | Temp 98.2°F | Resp 18

## 2019-07-27 DIAGNOSIS — Z79899 Other long term (current) drug therapy: Secondary | ICD-10-CM | POA: Insufficient documentation

## 2019-07-27 DIAGNOSIS — D638 Anemia in other chronic diseases classified elsewhere: Secondary | ICD-10-CM | POA: Insufficient documentation

## 2019-07-27 DIAGNOSIS — M353 Polymyalgia rheumatica: Secondary | ICD-10-CM | POA: Diagnosis present

## 2019-07-27 DIAGNOSIS — M1990 Unspecified osteoarthritis, unspecified site: Secondary | ICD-10-CM | POA: Insufficient documentation

## 2019-07-27 LAB — CBC WITH DIFFERENTIAL (CANCER CENTER ONLY)
Abs Immature Granulocytes: 0.04 10*3/uL (ref 0.00–0.07)
Basophils Absolute: 0 10*3/uL (ref 0.0–0.1)
Basophils Relative: 1 %
Eosinophils Absolute: 0.2 10*3/uL (ref 0.0–0.5)
Eosinophils Relative: 4 %
HCT: 32.6 % — ABNORMAL LOW (ref 36.0–46.0)
Hemoglobin: 9.9 g/dL — ABNORMAL LOW (ref 12.0–15.0)
Immature Granulocytes: 1 %
Lymphocytes Relative: 30 %
Lymphs Abs: 1.6 10*3/uL (ref 0.7–4.0)
MCH: 29.3 pg (ref 26.0–34.0)
MCHC: 30.4 g/dL (ref 30.0–36.0)
MCV: 96.4 fL (ref 80.0–100.0)
Monocytes Absolute: 0.7 10*3/uL (ref 0.1–1.0)
Monocytes Relative: 13 %
Neutro Abs: 2.7 10*3/uL (ref 1.7–7.7)
Neutrophils Relative %: 51 %
Platelet Count: 265 10*3/uL (ref 150–400)
RBC: 3.38 MIL/uL — ABNORMAL LOW (ref 3.87–5.11)
RDW: 15.9 % — ABNORMAL HIGH (ref 11.5–15.5)
WBC Count: 5.2 10*3/uL (ref 4.0–10.5)
nRBC: 0 % (ref 0.0–0.2)

## 2019-07-27 LAB — CMP (CANCER CENTER ONLY)
ALT: 14 U/L (ref 0–44)
AST: 24 U/L (ref 15–41)
Albumin: 3.1 g/dL — ABNORMAL LOW (ref 3.5–5.0)
Alkaline Phosphatase: 52 U/L (ref 38–126)
Anion gap: 9 (ref 5–15)
BUN: 17 mg/dL (ref 8–23)
CO2: 24 mmol/L (ref 22–32)
Calcium: 8.7 mg/dL — ABNORMAL LOW (ref 8.9–10.3)
Chloride: 107 mmol/L (ref 98–111)
Creatinine: 0.96 mg/dL (ref 0.44–1.00)
GFR, Est AFR Am: >60 mL/min (ref >60)
GFR, Estimated: 56 mL/min — ABNORMAL LOW (ref >60)
Glucose, Bld: 95 mg/dL (ref 70–99)
Potassium: 4.1 mmol/L (ref 3.5–5.1)
Sodium: 140 mmol/L (ref 135–145)
Total Bilirubin: 0.4 mg/dL (ref 0.3–1.2)
Total Protein: 6.4 g/dL — ABNORMAL LOW (ref 6.5–8.1)

## 2019-07-27 MED ORDER — EPOETIN ALFA-EPBX 40000 UNIT/ML IJ SOLN
40000.0000 [IU] | Freq: Once | INTRAMUSCULAR | Status: AC
  Administered 2019-07-27: 40000 [IU] via SUBCUTANEOUS

## 2019-07-27 MED ORDER — EPOETIN ALFA-EPBX 40000 UNIT/ML IJ SOLN
INTRAMUSCULAR | Status: AC
  Filled 2019-07-27: qty 1

## 2019-07-27 NOTE — Patient Instructions (Signed)

## 2019-08-09 ENCOUNTER — Other Ambulatory Visit: Payer: Self-pay | Admitting: *Deleted

## 2019-08-09 DIAGNOSIS — D638 Anemia in other chronic diseases classified elsewhere: Secondary | ICD-10-CM

## 2019-08-10 ENCOUNTER — Inpatient Hospital Stay: Payer: Medicare Other

## 2019-08-10 ENCOUNTER — Telehealth: Payer: Self-pay | Admitting: Hematology and Oncology

## 2019-08-10 NOTE — Telephone Encounter (Signed)
Returned patient's phone call regarding rescheduling missed 12/17 appointment, per patient's request appointment has moved to 12/21.

## 2019-08-14 ENCOUNTER — Inpatient Hospital Stay: Payer: Medicare Other

## 2019-08-14 ENCOUNTER — Other Ambulatory Visit: Payer: Self-pay

## 2019-08-14 VITALS — BP 143/71 | HR 99 | Temp 97.3°F | Resp 20

## 2019-08-14 DIAGNOSIS — D638 Anemia in other chronic diseases classified elsewhere: Secondary | ICD-10-CM

## 2019-08-14 DIAGNOSIS — M353 Polymyalgia rheumatica: Secondary | ICD-10-CM | POA: Diagnosis not present

## 2019-08-14 LAB — CMP (CANCER CENTER ONLY)
ALT: 9 U/L (ref 0–44)
AST: 18 U/L (ref 15–41)
Albumin: 3.1 g/dL — ABNORMAL LOW (ref 3.5–5.0)
Alkaline Phosphatase: 51 U/L (ref 38–126)
Anion gap: 9 (ref 5–15)
BUN: 18 mg/dL (ref 8–23)
CO2: 21 mmol/L — ABNORMAL LOW (ref 22–32)
Calcium: 8.5 mg/dL — ABNORMAL LOW (ref 8.9–10.3)
Chloride: 110 mmol/L (ref 98–111)
Creatinine: 0.97 mg/dL (ref 0.44–1.00)
GFR, Est AFR Am: >60 mL/min (ref >60)
GFR, Estimated: 56 mL/min — ABNORMAL LOW (ref >60)
Glucose, Bld: 121 mg/dL — ABNORMAL HIGH (ref 70–99)
Potassium: 3.9 mmol/L (ref 3.5–5.1)
Sodium: 140 mmol/L (ref 135–145)
Total Bilirubin: 0.4 mg/dL (ref 0.3–1.2)
Total Protein: 6.4 g/dL — ABNORMAL LOW (ref 6.5–8.1)

## 2019-08-14 LAB — CBC WITH DIFFERENTIAL (CANCER CENTER ONLY)
Abs Immature Granulocytes: 0.02 10*3/uL (ref 0.00–0.07)
Basophils Absolute: 0 10*3/uL (ref 0.0–0.1)
Basophils Relative: 1 %
Eosinophils Absolute: 0.2 10*3/uL (ref 0.0–0.5)
Eosinophils Relative: 3 %
HCT: 33.2 % — ABNORMAL LOW (ref 36.0–46.0)
Hemoglobin: 10.1 g/dL — ABNORMAL LOW (ref 12.0–15.0)
Immature Granulocytes: 0 %
Lymphocytes Relative: 31 %
Lymphs Abs: 1.7 10*3/uL (ref 0.7–4.0)
MCH: 29.7 pg (ref 26.0–34.0)
MCHC: 30.4 g/dL (ref 30.0–36.0)
MCV: 97.6 fL (ref 80.0–100.0)
Monocytes Absolute: 0.2 10*3/uL (ref 0.1–1.0)
Monocytes Relative: 4 %
Neutro Abs: 3.3 10*3/uL (ref 1.7–7.7)
Neutrophils Relative %: 61 %
Platelet Count: 228 10*3/uL (ref 150–400)
RBC: 3.4 MIL/uL — ABNORMAL LOW (ref 3.87–5.11)
RDW: 15.6 % — ABNORMAL HIGH (ref 11.5–15.5)
WBC Count: 5.4 10*3/uL (ref 4.0–10.5)
nRBC: 0 % (ref 0.0–0.2)

## 2019-08-14 MED ORDER — EPOETIN ALFA-EPBX 40000 UNIT/ML IJ SOLN
INTRAMUSCULAR | Status: AC
  Filled 2019-08-14: qty 1

## 2019-08-14 MED ORDER — EPOETIN ALFA-EPBX 40000 UNIT/ML IJ SOLN
40000.0000 [IU] | Freq: Once | INTRAMUSCULAR | Status: AC
  Administered 2019-08-14: 40000 [IU] via SUBCUTANEOUS

## 2019-08-14 NOTE — Patient Instructions (Signed)

## 2019-08-21 ENCOUNTER — Other Ambulatory Visit: Payer: Medicare Other

## 2019-08-21 ENCOUNTER — Ambulatory Visit: Payer: Medicare Other

## 2019-08-23 ENCOUNTER — Other Ambulatory Visit: Payer: Self-pay

## 2019-08-23 DIAGNOSIS — D638 Anemia in other chronic diseases classified elsewhere: Secondary | ICD-10-CM

## 2019-08-23 NOTE — Progress Notes (Signed)
Patient Care Team: Tower, [name], MD as PCP - General Ellyn Hack Leonie Green, MD as PCP - Cardiology (Cardiology) Suella Broad, MD as Consulting Physician (Physical Medicine and Rehabilitation) Gavin Pound, MD as Consulting Physician (Rheumatology) Haverstock, Jennefer Bravo, MD as Referring Physician (Dermatology) Nicholas Lose, MD as Consulting Physician (Hematology and Oncology)  DIAGNOSIS:    ICD-10-CM   1. Anemia of chronic disease  D63.8     CHIEF COMPLIANT: Follow-up of anemia  INTERVAL HISTORY: [name] [name] is a 78 y.o. with above-mentioned history of anemia who currently receives Retacrit. She presents to the clinic todayfor follow-up. Her energy levels are stable.  Denies any shortness of breath or palpitations.  REVIEW OF SYSTEMS:   Constitutional: Denies fevers, chills or abnormal weight loss Eyes: Denies blurriness of vision Ears, nose, mouth, throat, and face: Denies mucositis or sore throat Respiratory: Denies cough, dyspnea or wheezes Cardiovascular: Denies palpitation, chest discomfort Gastrointestinal: Denies nausea, heartburn or change in bowel habits Skin: Denies abnormal skin rashes Lymphatics: Denies new lymphadenopathy or easy bruising Neurological: Denies numbness, tingling or new weaknesses Behavioral/Psych: Mood is stable, no new changes  Extremities: No lower extremity edema Breast: denies any pain or lumps or nodules in either breasts All other systems were reviewed with the patient and are negative.  I have reviewed the past medical history, past surgical history, social history and family history with the patient and they are unchanged from previous note.  ALLERGIES:  is allergic to sulfonamide derivatives; ace inhibitors; oxaprozin; pregabalin; and amoxicillin-pot clavulanate.  MEDICATIONS:  Current Outpatient Medications  Medication Sig Dispense Refill  . acetaminophen (TYLENOL) 500 MG tablet Take 1,000 mg by mouth 2 (two) times daily as  needed for moderate pain or headache.     Marland Kitchen atorvastatin (LIPITOR) 20 MG tablet TAKE 1 TABLET BY MOUTH EVERY DAY 90 tablet 1  . busPIRone (BUSPAR) 15 MG tablet TAKE 1/2 TO 1 TABLET (7.5 TO 15 MG TOTAL) BY MOUTH 2 TIMES DAILY. (Patient taking differently: Take 15 mg by mouth daily. ) 180 tablet 2  . cetirizine (ZYRTEC) 10 MG tablet Take 1 tablet (10 mg total) by mouth daily. 30 tablet 11  . clopidogrel (PLAVIX) 75 MG tablet Take 1 tablet (75 mg total) by mouth daily with breakfast. 90 tablet 3  . colestipol (COLESTID) 1 g tablet Take 1 tablet (1 g total) by mouth 2 (two) times daily. 60 tablet 3  . fluticasone (FLONASE) 50 MCG/ACT nasal spray Place 2 sprays into both nostrils daily. 16 g 6  . folic acid (FOLVITE) 1 MG tablet Take 2 mg by mouth daily.     Marland Kitchen HYDROcodone-acetaminophen (NORCO/VICODIN) 5-325 MG tablet Take 1-2 tablets by mouth every 4 (four) hours as needed for moderate pain or severe pain. 30 tablet 0  . isosorbide mononitrate (IMDUR) 60 MG 24 hr tablet Take 60 mg by mouth daily.    Marland Kitchen losartan-hydrochlorothiazide (HYZAAR) 50-12.5 MG tablet TAKE 1 TABLET BY MOUTH EVERY DAY 90 tablet 1  . Melatonin 3 MG CAPS Take 9 mg by mouth at bedtime.     . methotrexate (RHEUMATREX) 2.5 MG tablet Take 6 tablets (15 mg total) by mouth once a week. (7 tablets) 4 tablet   . metoprolol tartrate (LOPRESSOR) 25 MG tablet TAKE 1 TABLET BY MOUTH TWICE A DAY 180 tablet 1  . nitroGLYCERIN (NITROSTAT) 0.4 MG SL tablet Place 1 tablet (0.4 mg total) under the tongue every 5 (five) minutes as needed for chest pain. 25 tablet 5  .  pantoprazole (PROTONIX) 40 MG tablet Take 1 tablet (40 mg total) by mouth daily. 90 tablet 1  . raloxifene (EVISTA) 60 MG tablet Take 1 tablet (60 mg total) by mouth daily. 90 tablet 3  . Vitamin D, Cholecalciferol, 25 MCG (1000 UT) CAPS Take 1 capsule by mouth daily.     No current facility-administered medications for this visit.    PHYSICAL EXAMINATION: ECOG PERFORMANCE STATUS: 1 -  Symptomatic but completely ambulatory  Vitals:   08/24/19 1430  BP: (!) 146/62  Pulse: 83  Resp: 18  Temp: 98.5 F (36.9 C)  SpO2: 98%   Filed Weights   08/24/19 1430  Weight: 167 lb 4.8 oz (75.9 kg)    GENERAL: alert, no distress and comfortable SKIN: skin color, texture, turgor are normal, no rashes or significant lesions EYES: normal, Conjunctiva are pink and non-injected, sclera clear OROPHARYNX: no exudate, no erythema and lips, buccal mucosa, and tongue normal  NECK: supple, thyroid normal size, non-tender, without nodularity LYMPH: no palpable lymphadenopathy in the cervical, axillary or inguinal LUNGS: clear to auscultation and percussion with normal breathing effort HEART: regular rate & rhythm and no murmurs and no lower extremity edema ABDOMEN: abdomen soft, non-tender and normal bowel sounds MUSCULOSKELETAL: no cyanosis of digits and no clubbing  NEURO: alert & oriented x 3 with fluent speech, no focal motor/sensory deficits EXTREMITIES: No lower extremity edema  LABORATORY DATA:  I have reviewed the data as listed CMP Latest Ref Rng & Units 08/14/2019 07/27/2019 07/13/2019  Glucose 70 - 99 mg/dL 121(H) 95 88  BUN 8 - 23 mg/dL 18 17 22   Creatinine 0.44 - 1.00 mg/dL 0.97 0.96 1.00  Sodium 135 - 145 mmol/L 140 140 139  Potassium 3.5 - 5.1 mmol/L 3.9 4.1 4.0  Chloride 98 - 111 mmol/L 110 107 107  CO2 22 - 32 mmol/L 21(L) 24 23  Calcium 8.9 - 10.3 mg/dL 8.5(L) 8.7(L) 9.0  Total Protein 6.5 - 8.1 g/dL 6.4(L) 6.4(L) 6.9  Total Bilirubin 0.3 - 1.2 mg/dL 0.4 0.4 0.4  Alkaline Phos 38 - 126 U/L 51 52 56  AST 15 - 41 U/L 18 24 18   ALT 0 - 44 U/L 9 14 10     Lab Results  Component Value Date   WBC 5.0 08/24/2019   HGB 10.2 (L) 08/24/2019   HCT 34.1 (L) 08/24/2019   MCV 98.6 08/24/2019   PLT 265 08/24/2019   NEUTROABS 2.3 08/24/2019    ASSESSMENT & PLAN:  Anemia of chronic disease 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 ( anemia of  chronic inflammation), patient has chronic polymyalgia rheumatica I reviewed her blood counts with her. 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  Lab review 10/12/2017: Hemoglobin 10.2, MCV 94, normal WBC and platelet counts, creatinine 1.02 04/13/2019:Hemoglobin 9 05/19/2019:Hemoglobin 9.1 07/13/2019: Hemoglobin 10.3 08/24/2019: Hemoglobin 10.2  Current treatment:Retacrit 40,000 units currently receiving it every 2-weeks. I recommended that she receive Retacrit injection with a hemoglobin of 10.2 today.  I will see her back in 6 weeks with a CBC and injection appointments.  No orders of the defined types were placed in this encounter.  The patient has a good understanding of the overall plan. she agrees with it. she will call with any problems that may develop before the next visit here.  Nicholas Lose, MD 08/24/2019  [name] [name], am acting as scribe for Dr. Nicholas Lose.  I have reviewed the above document for accuracy and completeness, and I agree with the above.

## 2019-08-24 ENCOUNTER — Inpatient Hospital Stay (HOSPITAL_BASED_OUTPATIENT_CLINIC_OR_DEPARTMENT_OTHER): Payer: Medicare Other | Admitting: Hematology and Oncology

## 2019-08-24 ENCOUNTER — Other Ambulatory Visit: Payer: Self-pay

## 2019-08-24 ENCOUNTER — Inpatient Hospital Stay: Payer: Medicare Other

## 2019-08-24 DIAGNOSIS — M353 Polymyalgia rheumatica: Secondary | ICD-10-CM | POA: Diagnosis not present

## 2019-08-24 DIAGNOSIS — D638 Anemia in other chronic diseases classified elsewhere: Secondary | ICD-10-CM

## 2019-08-24 LAB — CBC WITH DIFFERENTIAL (CANCER CENTER ONLY)
Abs Immature Granulocytes: 0.03 10*3/uL (ref 0.00–0.07)
Basophils Absolute: 0 10*3/uL (ref 0.0–0.1)
Basophils Relative: 0 %
Eosinophils Absolute: 0.2 10*3/uL (ref 0.0–0.5)
Eosinophils Relative: 4 %
HCT: 34.1 % — ABNORMAL LOW (ref 36.0–46.0)
Hemoglobin: 10.2 g/dL — ABNORMAL LOW (ref 12.0–15.0)
Immature Granulocytes: 1 %
Lymphocytes Relative: 36 %
Lymphs Abs: 1.8 10*3/uL (ref 0.7–4.0)
MCH: 29.5 pg (ref 26.0–34.0)
MCHC: 29.9 g/dL — ABNORMAL LOW (ref 30.0–36.0)
MCV: 98.6 fL (ref 80.0–100.0)
Monocytes Absolute: 0.7 10*3/uL (ref 0.1–1.0)
Monocytes Relative: 13 %
Neutro Abs: 2.3 10*3/uL (ref 1.7–7.7)
Neutrophils Relative %: 46 %
Platelet Count: 265 10*3/uL (ref 150–400)
RBC: 3.46 MIL/uL — ABNORMAL LOW (ref 3.87–5.11)
RDW: 15.9 % — ABNORMAL HIGH (ref 11.5–15.5)
WBC Count: 5 10*3/uL (ref 4.0–10.5)
nRBC: 0 % (ref 0.0–0.2)

## 2019-08-24 LAB — CMP (CANCER CENTER ONLY)
ALT: 11 U/L (ref 0–44)
AST: 25 U/L (ref 15–41)
Albumin: 3.2 g/dL — ABNORMAL LOW (ref 3.5–5.0)
Alkaline Phosphatase: 53 U/L (ref 38–126)
Anion gap: 8 (ref 5–15)
BUN: 21 mg/dL (ref 8–23)
CO2: 23 mmol/L (ref 22–32)
Calcium: 8.4 mg/dL — ABNORMAL LOW (ref 8.9–10.3)
Chloride: 111 mmol/L (ref 98–111)
Creatinine: 1.02 mg/dL — ABNORMAL HIGH (ref 0.44–1.00)
GFR, Est AFR Am: >60 mL/min (ref >60)
GFR, Estimated: 52 mL/min — ABNORMAL LOW (ref >60)
Glucose, Bld: 95 mg/dL (ref 70–99)
Potassium: 4.1 mmol/L (ref 3.5–5.1)
Sodium: 142 mmol/L (ref 135–145)
Total Bilirubin: 0.3 mg/dL (ref 0.3–1.2)
Total Protein: 6.5 g/dL (ref 6.5–8.1)

## 2019-08-24 MED ORDER — EPOETIN ALFA-EPBX 40000 UNIT/ML IJ SOLN
40000.0000 [IU] | Freq: Once | INTRAMUSCULAR | Status: AC
  Administered 2019-08-24: 15:00:00 40000 [IU] via SUBCUTANEOUS

## 2019-08-24 MED ORDER — EPOETIN ALFA-EPBX 40000 UNIT/ML IJ SOLN
INTRAMUSCULAR | Status: AC
  Filled 2019-08-24: qty 1

## 2019-08-24 NOTE — Assessment & Plan Note (Signed)
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 ( anemia of chronic inflammation), patient has chronic polymyalgia rheumatica I reviewed her blood counts with her. 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  Lab review 10/12/2017: Hemoglobin 10.2, MCV 94, normal WBC and platelet counts, creatinine 1.02 04/13/2019:Hemoglobin 9 05/19/2019:Hemoglobin 9.1 07/13/2019: Hemoglobin 10.3 08/24/2019:  Current treatment:Retacrit 40,000 units we will switch her to every 2-week injections. I recommended that she receive Retacrit injection with a hemoglobin of 10.3 today.  I will see her back in 6 weeks with a CBC and injection appointments.

## 2019-08-24 NOTE — Patient Instructions (Signed)

## 2019-08-28 ENCOUNTER — Telehealth: Payer: Self-pay | Admitting: Hematology and Oncology

## 2019-08-28 NOTE — Telephone Encounter (Signed)
I could not reach patient regarding schedule  °

## 2019-09-07 ENCOUNTER — Inpatient Hospital Stay: Payer: Medicare Other | Attending: Hematology and Oncology

## 2019-09-07 ENCOUNTER — Other Ambulatory Visit: Payer: Self-pay

## 2019-09-07 ENCOUNTER — Inpatient Hospital Stay: Payer: Medicare Other

## 2019-09-07 DIAGNOSIS — D638 Anemia in other chronic diseases classified elsewhere: Secondary | ICD-10-CM

## 2019-09-07 DIAGNOSIS — M353 Polymyalgia rheumatica: Secondary | ICD-10-CM | POA: Diagnosis not present

## 2019-09-07 DIAGNOSIS — Z79899 Other long term (current) drug therapy: Secondary | ICD-10-CM | POA: Diagnosis not present

## 2019-09-07 LAB — CBC WITH DIFFERENTIAL (CANCER CENTER ONLY)
Abs Immature Granulocytes: 0.07 10*3/uL (ref 0.00–0.07)
Basophils Absolute: 0.1 10*3/uL (ref 0.0–0.1)
Basophils Relative: 1 %
Eosinophils Absolute: 0.1 10*3/uL (ref 0.0–0.5)
Eosinophils Relative: 2 %
HCT: 38.3 % (ref 36.0–46.0)
Hemoglobin: 11.4 g/dL — ABNORMAL LOW (ref 12.0–15.0)
Immature Granulocytes: 1 %
Lymphocytes Relative: 30 %
Lymphs Abs: 2.3 10*3/uL (ref 0.7–4.0)
MCH: 28.9 pg (ref 26.0–34.0)
MCHC: 29.8 g/dL — ABNORMAL LOW (ref 30.0–36.0)
MCV: 97.2 fL (ref 80.0–100.0)
Monocytes Absolute: 0.9 10*3/uL (ref 0.1–1.0)
Monocytes Relative: 12 %
Neutro Abs: 4.1 10*3/uL (ref 1.7–7.7)
Neutrophils Relative %: 54 %
Platelet Count: 272 10*3/uL (ref 150–400)
RBC: 3.94 MIL/uL (ref 3.87–5.11)
RDW: 15.1 % (ref 11.5–15.5)
WBC Count: 7.6 10*3/uL (ref 4.0–10.5)
nRBC: 0 % (ref 0.0–0.2)

## 2019-09-07 LAB — CMP (CANCER CENTER ONLY)
ALT: 8 U/L (ref 0–44)
AST: 16 U/L (ref 15–41)
Albumin: 3.4 g/dL — ABNORMAL LOW (ref 3.5–5.0)
Alkaline Phosphatase: 56 U/L (ref 38–126)
Anion gap: 8 (ref 5–15)
BUN: 21 mg/dL (ref 8–23)
CO2: 26 mmol/L (ref 22–32)
Calcium: 8.2 mg/dL — ABNORMAL LOW (ref 8.9–10.3)
Chloride: 108 mmol/L (ref 98–111)
Creatinine: 0.97 mg/dL (ref 0.44–1.00)
GFR, Est AFR Am: >60 mL/min (ref >60)
GFR, Estimated: 55 mL/min — ABNORMAL LOW (ref >60)
Glucose, Bld: 65 mg/dL — ABNORMAL LOW (ref 70–99)
Potassium: 4.3 mmol/L (ref 3.5–5.1)
Sodium: 142 mmol/L (ref 135–145)
Total Bilirubin: 0.5 mg/dL (ref 0.3–1.2)
Total Protein: 6.5 g/dL (ref 6.5–8.1)

## 2019-09-07 NOTE — Progress Notes (Signed)
PT here today for retacrit injection. Her HGB is 11.4 today. No injection at that time

## 2019-09-14 ENCOUNTER — Ambulatory Visit: Payer: Medicare Other | Attending: Internal Medicine

## 2019-09-14 ENCOUNTER — Telehealth: Payer: Self-pay | Admitting: Adult Health

## 2019-09-14 DIAGNOSIS — Z23 Encounter for immunization: Secondary | ICD-10-CM | POA: Insufficient documentation

## 2019-09-14 NOTE — Telephone Encounter (Signed)
Returned patient's phone call regarding rescheduling 02/11 appointment, per patient's request appointment has moved to 02/10.

## 2019-09-14 NOTE — Progress Notes (Signed)
   Covid-19 Vaccination Clinic  Name:  [name] [name]    MRN: [id number] DOB: [name] 10, 1941  09/14/2019  Ms. Care was observed post Covid-19 immunization for 15 minutes without incidence. She was provided with Vaccine Information Sheet and instruction to access the V-Safe system.   [name] was instructed to call 911 with any severe reactions post vaccine: Marland Kitchen Difficulty breathing  . Swelling of your face and throat  . A fast heartbeat  . A bad rash all over your body  . Dizziness and weakness    Immunizations Administered    Name Date Dose VIS Date Route   Pfizer COVID-19 Vaccine 09/14/2019 10:39 AM 0.3 mL 08/04/2019 Intramuscular   Manufacturer: [place]   Lot: [id number]   [place]: [id number]

## 2019-09-21 ENCOUNTER — Inpatient Hospital Stay: Payer: Medicare Other

## 2019-09-21 ENCOUNTER — Other Ambulatory Visit: Payer: Self-pay

## 2019-09-21 ENCOUNTER — Ambulatory Visit: Payer: Medicare Other | Admitting: Hematology and Oncology

## 2019-09-21 VITALS — BP 142/68 | HR 78 | Temp 98.2°F | Resp 18

## 2019-09-21 DIAGNOSIS — D638 Anemia in other chronic diseases classified elsewhere: Secondary | ICD-10-CM

## 2019-09-21 DIAGNOSIS — M353 Polymyalgia rheumatica: Secondary | ICD-10-CM | POA: Diagnosis not present

## 2019-09-21 LAB — CMP (CANCER CENTER ONLY)
ALT: 15 U/L (ref 0–44)
AST: 22 U/L (ref 15–41)
Albumin: 3.4 g/dL — ABNORMAL LOW (ref 3.5–5.0)
Alkaline Phosphatase: 68 U/L (ref 38–126)
Anion gap: 7 (ref 5–15)
BUN: 25 mg/dL — ABNORMAL HIGH (ref 8–23)
CO2: 24 mmol/L (ref 22–32)
Calcium: 8.5 mg/dL — ABNORMAL LOW (ref 8.9–10.3)
Chloride: 112 mmol/L — ABNORMAL HIGH (ref 98–111)
Creatinine: 1.1 mg/dL — ABNORMAL HIGH (ref 0.44–1.00)
GFR, Est AFR Am: 55 mL/min — ABNORMAL LOW (ref >60)
GFR, Estimated: 47 mL/min — ABNORMAL LOW (ref >60)
Glucose, Bld: 93 mg/dL (ref 70–99)
Potassium: 4.1 mmol/L (ref 3.5–5.1)
Sodium: 143 mmol/L (ref 135–145)
Total Bilirubin: 0.2 mg/dL — ABNORMAL LOW (ref 0.3–1.2)
Total Protein: 6.8 g/dL (ref 6.5–8.1)

## 2019-09-21 LAB — CBC WITH DIFFERENTIAL (CANCER CENTER ONLY)
Abs Immature Granulocytes: 0.02 10*3/uL (ref 0.00–0.07)
Basophils Absolute: 0 10*3/uL (ref 0.0–0.1)
Basophils Relative: 1 %
Eosinophils Absolute: 0.1 10*3/uL (ref 0.0–0.5)
Eosinophils Relative: 2 %
HCT: 33.8 % — ABNORMAL LOW (ref 36.0–46.0)
Hemoglobin: 10 g/dL — ABNORMAL LOW (ref 12.0–15.0)
Immature Granulocytes: 0 %
Lymphocytes Relative: 39 %
Lymphs Abs: 1.9 10*3/uL (ref 0.7–4.0)
MCH: 29 pg (ref 26.0–34.0)
MCHC: 29.6 g/dL — ABNORMAL LOW (ref 30.0–36.0)
MCV: 98 fL (ref 80.0–100.0)
Monocytes Absolute: 0.4 10*3/uL (ref 0.1–1.0)
Monocytes Relative: 7 %
Neutro Abs: 2.4 10*3/uL (ref 1.7–7.7)
Neutrophils Relative %: 51 %
Platelet Count: 242 10*3/uL (ref 150–400)
RBC: 3.45 MIL/uL — ABNORMAL LOW (ref 3.87–5.11)
RDW: 14.4 % (ref 11.5–15.5)
WBC Count: 4.8 10*3/uL (ref 4.0–10.5)
nRBC: 0 % (ref 0.0–0.2)

## 2019-09-21 MED ORDER — EPOETIN ALFA-EPBX 40000 UNIT/ML IJ SOLN
INTRAMUSCULAR | Status: AC
  Filled 2019-09-21: qty 1

## 2019-09-21 MED ORDER — EPOETIN ALFA-EPBX 40000 UNIT/ML IJ SOLN
40000.0000 [IU] | Freq: Once | INTRAMUSCULAR | Status: AC
  Administered 2019-09-21: 40000 [IU] via SUBCUTANEOUS

## 2019-09-21 NOTE — Patient Instructions (Signed)

## 2019-09-29 ENCOUNTER — Other Ambulatory Visit: Payer: Medicare Other

## 2019-10-04 ENCOUNTER — Inpatient Hospital Stay: Payer: Medicare Other

## 2019-10-04 ENCOUNTER — Other Ambulatory Visit: Payer: Self-pay

## 2019-10-04 ENCOUNTER — Inpatient Hospital Stay: Payer: Medicare Other | Admitting: Adult Health

## 2019-10-04 ENCOUNTER — Inpatient Hospital Stay: Payer: Medicare Other | Attending: Hematology and Oncology

## 2019-10-04 DIAGNOSIS — Z85828 Personal history of other malignant neoplasm of skin: Secondary | ICD-10-CM | POA: Insufficient documentation

## 2019-10-04 DIAGNOSIS — I252 Old myocardial infarction: Secondary | ICD-10-CM | POA: Diagnosis not present

## 2019-10-04 DIAGNOSIS — I447 Left bundle-branch block, unspecified: Secondary | ICD-10-CM | POA: Diagnosis not present

## 2019-10-04 DIAGNOSIS — E559 Vitamin D deficiency, unspecified: Secondary | ICD-10-CM | POA: Insufficient documentation

## 2019-10-04 DIAGNOSIS — E871 Hypo-osmolality and hyponatremia: Secondary | ICD-10-CM | POA: Diagnosis not present

## 2019-10-04 DIAGNOSIS — I10 Essential (primary) hypertension: Secondary | ICD-10-CM | POA: Insufficient documentation

## 2019-10-04 DIAGNOSIS — D638 Anemia in other chronic diseases classified elsewhere: Secondary | ICD-10-CM | POA: Diagnosis not present

## 2019-10-04 DIAGNOSIS — I2510 Atherosclerotic heart disease of native coronary artery without angina pectoris: Secondary | ICD-10-CM | POA: Diagnosis not present

## 2019-10-04 DIAGNOSIS — M8580 Other specified disorders of bone density and structure, unspecified site: Secondary | ICD-10-CM | POA: Diagnosis not present

## 2019-10-04 DIAGNOSIS — K219 Gastro-esophageal reflux disease without esophagitis: Secondary | ICD-10-CM | POA: Insufficient documentation

## 2019-10-04 DIAGNOSIS — M353 Polymyalgia rheumatica: Secondary | ICD-10-CM | POA: Diagnosis not present

## 2019-10-04 DIAGNOSIS — F419 Anxiety disorder, unspecified: Secondary | ICD-10-CM | POA: Insufficient documentation

## 2019-10-04 LAB — CMP (CANCER CENTER ONLY)
ALT: 12 U/L (ref 0–44)
AST: 19 U/L (ref 15–41)
Albumin: 3.3 g/dL — ABNORMAL LOW (ref 3.5–5.0)
Alkaline Phosphatase: 62 U/L (ref 38–126)
Anion gap: 9 (ref 5–15)
BUN: 25 mg/dL — ABNORMAL HIGH (ref 8–23)
CO2: 24 mmol/L (ref 22–32)
Calcium: 8.2 mg/dL — ABNORMAL LOW (ref 8.9–10.3)
Chloride: 111 mmol/L (ref 98–111)
Creatinine: 1.25 mg/dL — ABNORMAL HIGH (ref 0.44–1.00)
GFR, Est AFR Am: 47 mL/min — ABNORMAL LOW (ref >60)
GFR, Estimated: 41 mL/min — ABNORMAL LOW (ref >60)
Glucose, Bld: 95 mg/dL (ref 70–99)
Potassium: 4.4 mmol/L (ref 3.5–5.1)
Sodium: 144 mmol/L (ref 135–145)
Total Bilirubin: 0.3 mg/dL (ref 0.3–1.2)
Total Protein: 6.6 g/dL (ref 6.5–8.1)

## 2019-10-04 LAB — CBC WITH DIFFERENTIAL (CANCER CENTER ONLY)
Abs Immature Granulocytes: 0.01 10*3/uL (ref 0.00–0.07)
Basophils Absolute: 0 10*3/uL (ref 0.0–0.1)
Basophils Relative: 1 %
Eosinophils Absolute: 0.1 10*3/uL (ref 0.0–0.5)
Eosinophils Relative: 2 %
HCT: 35.2 % — ABNORMAL LOW (ref 36.0–46.0)
Hemoglobin: 10.7 g/dL — ABNORMAL LOW (ref 12.0–15.0)
Immature Granulocytes: 0 %
Lymphocytes Relative: 39 %
Lymphs Abs: 2.3 10*3/uL (ref 0.7–4.0)
MCH: 29.4 pg (ref 26.0–34.0)
MCHC: 30.4 g/dL (ref 30.0–36.0)
MCV: 96.7 fL (ref 80.0–100.0)
Monocytes Absolute: 0.6 10*3/uL (ref 0.1–1.0)
Monocytes Relative: 9 %
Neutro Abs: 2.9 10*3/uL (ref 1.7–7.7)
Neutrophils Relative %: 49 %
Platelet Count: 270 10*3/uL (ref 150–400)
RBC: 3.64 MIL/uL — ABNORMAL LOW (ref 3.87–5.11)
RDW: 14.4 % (ref 11.5–15.5)
WBC Count: 6 10*3/uL (ref 4.0–10.5)
nRBC: 0 % (ref 0.0–0.2)

## 2019-10-04 MED ORDER — EPOETIN ALFA-EPBX 40000 UNIT/ML IJ SOLN: 40000.0000 [IU] | Freq: Once | INTRAMUSCULAR | Status: DC

## 2019-10-04 NOTE — Progress Notes (Signed)
Per MD patient will not receive injection today.

## 2019-10-04 NOTE — Patient Instructions (Signed)

## 2019-10-04 NOTE — Progress Notes (Signed)
[facility] Follow up:    Tower, [name] Fanny, MD [place] [address]   DIAGNOSIS: Anemia of Chronic disease (patient has chronic polymyalgia rheumatica)  SUMMARY OF HEMATOLOGIC HISTORY: 1. Noted in 09/2017 with normocytic anemia, slightly elevated creatinine.   2. Started Retacrit weekly on 04/18/2019; now receiving every three weeks  CURRENT THERAPY: Retacrit every 3 weeks  INTERVAL HISTORY: [name] [name] 78 y.o. female returns for evaluation prior to receiving Retacrit.  [name] [name] was previously receiving this every 2 weeks, however notes that Dr. Lindi Adie had recently changed it to every 3 weeks.  It has only been 2 weeks since [name] [name] last injection.  [name] [name] hemoglobin is 10.7.  Zohra notes that Dr. Lindi Adie had told [name] [name] before that he would give [name] [name] the injection for hemoglobin in the lower 10 range, but not in the upper 10 range.  [name] [name] notes that [name] [name] tolerates the injections well and has no side effects that [name] [name] is aware of.  [name] [name] is feeling less fatigued.     Patient Active Problem List   Diagnosis Date Noted  . CAP (community acquired pneumonia) 06/30/2019  . UTI (urinary tract infection) 06/19/2019  . Hyponatremia 06/19/2019  . Pedal edema 10/04/2018  . Grief reaction 09/17/2018  . 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 Routine general medical examination at a health care facility 10/14/2017  . Preop cardiovascular exam 10/01/2017  . CAD S/P percutaneous coronary angioplasty 01/27/2017  . Aortic regurgitation 01/27/2017  . Dyspnea on exertion 08/19/2016  . Left bundle branch block (LBBB) on electrocardiogram 08/19/2016  . Estrogen deficiency 10/08/2015  . Chronic fatigue 08/20/2015  . Deficiency anemia 11/17/2013  . Encounter for Medicare annual wellness exam 07/17/2013  . PMR (polymyalgia rheumatica) (HCC) 07/17/2013  . Intertrigo 04/19/2013  . Expected blood loss anemia 04/05/2013  . Overweight (BMI 25.0-29.9) 04/05/2013  . S/P right  THA, AA 04/04/2013  . IBS (irritable bowel syndrome) 05/30/2012  . Anemia of chronic disease 04/16/2010  . Generalized anxiety disorder 04/16/2010  . GERD 03/04/2009  . Vitamin D deficiency 03/12/2008  . URINARY TRACT INFECTION, RECURRENT 08/07/2007  . 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 FIBROCYSTIC BREAST DISEASE, HX OF 02/11/2007  . Hyperlipidemia with low HDL 01/10/2007  . DEPRESSION 01/10/2007  . RETINAL VEIN OCCLUSION 01/10/2007  . Essential hypertension 01/10/2007  . PEPTIC ULCER DISEASE 01/10/2007  . 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 SKIN CANCER, HX OF 01/10/2007    is allergic to sulfonamide derivatives; ace inhibitors; oxaprozin; pregabalin; and amoxicillin-pot clavulanate.  MEDICAL HISTORY: Past Medical History:  Diagnosis Date  . Anemia    of chronic disease  . Anxiety   . Arthritis    "psoratic arthritis; new dx" (09/11/2016)  . CAD S/P percutaneous coronary angioplasty 08/2016   mLCx 90% -> 0% (2.75 mm x 16 mm) Synergy DES PCI Ostial D1 70% (not PTCA target). Moderate ostial and mD2.  LAD tandem 50% stenoses w/ negative FFR (0.86)  . Cancer ([place])    skin cancer on leg  . Chronic lower back pain   . Complication of anesthesia 1980 and 1982   trouble waking up after breast biopsy, many surgeries since no problem  . Depression   . Dyspnea on effort - CHRONIC    partial relief of Sx post PCI of L Circumflex.  . Fibrocystic breast   . GERD (gastroesophageal reflux disease)   . Headache    "monthly" (09/11/2016)  . History of blood transfusion    "  several since age 89; I've had them after most of my surgeries" (09/11/2016)  . History of myocardial infarction    "doctor said there are signs I've had a heart attack; don't know when"  - Myoview [name] 2018 indicated small Inferior - Inferoseptal Infarct (vs. rest ischemia). Echo with inferoseptal hypokinesis.  Marland Kitchen Hx of skin cancer, basal cell 2013 and 2012   right inner, lower leg; several scattered around my body  .  Hypertension   . Iron deficiency anemia    "I've had an iron infusion"  . Osteoarthritis   . Osteopenia   . Peptic ulcer disease    H pylori  . PMR (polymyalgia rheumatica) (HCC)   . PONV (postoperative nausea and vomiting)   . Psoriasis     SURGICAL HISTORY: Past Surgical History:  Procedure Laterality Date  . APPENDECTOMY    . BACK SURGERY     x 6   . BREAST BIOPSY Left 1980 and 1982   "benign"  . CARDIAC CATHETERIZATION N/A 09/11/2016   Procedure: Left Heart Cath and Coronary Angiography;  Surgeon: Leonie Man, MD;  Location: [facility];  Service: Cardiovascular: mLCx 90% * (PCI). Ostial D1 70% (not PTCA target). Mod ostial and mD2.  LAD tandem 50% stenoses w/ negative FFR (0.86)  . CARDIAC CATHETERIZATION N/A 09/11/2016   Procedure: Intravascular Pressure Wire/FFR Study;  Surgeon: Leonie Man, MD;  Location: [facility];  Service: Cardiovascular: --  LAD tandem 50% stenoses w/ negative FFR (0.86  . CARDIAC CATHETERIZATION N/A 09/11/2016   Procedure: Coronary Stent Intervention;  Surgeon: Leonie Man, MD;  Location: [facility];  Service: Cardiovascular: -- DES PCI mLCx 90%--0% SYNERGY DES 2.75 MM X 16 MM.  Marland Kitchen CARDIAC CATHETERIZATION  2001   non-obstructive CAD  . CATARACT EXTRACTION W/ INTRAOCULAR LENS  IMPLANT, BILATERAL  July -  August 2017  . DILATION AND CURETTAGE OF UTERUS  X 3  . ESOPHAGOGASTRODUODENOSCOPY  03/2009   ulcer,HP, GERD stricture  . EXCISIONAL HEMORRHOIDECTOMY    . HIP ARTHROPLASTY Left 2010  . JOINT REPLACEMENT    . KNEE ARTHROSCOPY Bilateral   . LAPAROSCOPIC CHOLECYSTECTOMY  2005  . LUMBAR DISC SURGERY  11/1984; 1987; 1989; 2004; 2005 X 2   deg. disk lumber spine   . MOHS SURGERY Right    "inside my lower leg"  . NASAL SINUS SURGERY  1970s  . NM MYOVIEW LTD  08/28/2016   INTERMEDIATE RISK (b/c reduced EF & ? small infarct) -- EF ~35-40%.  ? Prior Small Inferior-apical & septal infarct (w/o ischemia).  Difuse HK - worse in  inferoapex.  Suggest prior infarct with no ischemia. Ischemic area correlates with echo WMA  . REVERSE SHOULDER ARTHROPLASTY Left 12/17/2017   Procedure: LEFT REVERSE SHOULDER ARTHROPLASTY;  Surgeon: Netta Cedars, MD;  Location: [facility];  Service: Orthopedics;  Laterality: Left;  . TOTAL HIP ARTHROPLASTY Right 04/04/2013   Procedure: RIGHT TOTAL HIP ARTHROPLASTY ANTERIOR APPROACH;  Surgeon: Mauri Pole, MD;  Location: [facility];  Service: Orthopedics;  Laterality: Right;  . TOTAL KNEE ARTHROPLASTY Right 2009  . TRANSTHORACIC ECHOCARDIOGRAM  08/2016   mild conc LVH. EF 45-50% - anteroseptal & inferoseptal HK. Mod AI.  Marland Kitchen TRANSTHORACIC ECHOCARDIOGRAM  08/2017   (post PCI) --> EF normalized:  Normal LV systolic function; mild diastolic dysfunction; mild   LVH; sclerotic aortic valve with mild AI.  No RWMA  . TUBAL LIGATION    . VAGINAL HYSTERECTOMY  1970s  partial     SOCIAL HISTORY: Social History   Socioeconomic History  . Marital status: Married    Spouse name: Not on file  . Number of children: Not on file  . Years of education: Not on file  . Highest education level: Not on file  Occupational History  . Not on file  Tobacco Use  . Smoking status: Never Smoker  . Smokeless tobacco: Never Used  Substance and Sexual Activity  . Alcohol use: Yes    Alcohol/week: 0.0 standard drinks    Comment: 09/11/2016 "might have a margarita q 3-4 months"  . Drug use: No  . Sexual activity: Not Currently  Other Topics Concern  . Not on file  Social History Narrative   Now Widowed as of [name] 2020.   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   Social Determinants of Health   Financial Resource Strain:   . Difficulty of Paying Living Expenses: Not on file  Food Insecurity:   . Worried About Charity fundraiser in the Last Year: Not  on file  . Ran Out of Food in the Last Year: Not on file  Transportation Needs:   . Lack of Transportation (Medical): Not on file  . Lack of Transportation (Non-Medical): Not on file  Physical Activity:   . Days of Exercise per Week: Not on file  . Minutes of Exercise per Session: Not on file  Stress:   . Feeling of Stress : Not on file  Social Connections:   . Frequency of Communication with Friends and Family: Not on file  . Frequency of Social Gatherings with Friends and Family: Not on file  . Attends Religious Services: Not on file  . Active Member of Clubs or Organizations: Not on file  . Attends Archivist Meetings: Not on file  . Marital Status: Not on file  Intimate Partner Violence:   . Fear of Current or Ex-Partner: Not on file  . Emotionally Abused: Not on file  . Physically Abused: Not on file  . Sexually Abused: Not on file    FAMILY HISTORY: Family History  Problem Relation Age of Onset  . Arthritis Mother   . Emphysema Mother   . Hyperlipidemia Mother   . Hypertension Mother   . Heart disease Mother   . Heart disease Brother        CAD  . Diabetes Paternal Grandmother   . Heart disease Maternal Aunt     Review of Systems  Constitutional: Positive for fatigue. Negative for appetite change, chills, fever and unexpected weight change.  HENT:   Negative for hearing loss, lump/mass and trouble swallowing.   Eyes: Negative for eye problems and icterus.  Respiratory: Negative for chest tightness, cough and shortness of breath.   Cardiovascular: Negative for chest pain, leg swelling and palpitations.  Gastrointestinal: Negative for abdominal distention, abdominal pain, constipation, diarrhea, nausea and vomiting.  Endocrine: Negative for hot flashes.  Musculoskeletal: Negative for arthralgias.  Skin: Negative for itching and rash.  Neurological: Negative for dizziness, extremity weakness, headaches and numbness.  Hematological: Negative for  adenopathy. Does not bruise/bleed easily.  Psychiatric/Behavioral: Negative for depression. The patient is not nervous/anxious.       PHYSICAL EXAMINATION  ECOG PERFORMANCE STATUS: 1 - Symptomatic but  completely ambulatory  Vitals:   10/04/19 1336  BP: (!) 148/67  Pulse: 78  Resp: 18  Temp: 98.2 F (36.8 C)  SpO2: 100%    Physical Exam Constitutional:      General: [name] [name] is not in acute distress.    Appearance: Normal appearance. [name] [name] is not toxic-appearing.  HENT:     Head: Normocephalic and atraumatic.  Eyes:     General: No scleral icterus. Cardiovascular:     Rate and Rhythm: Normal rate and regular rhythm.     Pulses: Normal pulses.     Heart sounds: Normal heart sounds.  Pulmonary:     Effort: Pulmonary effort is normal.     Breath sounds: Normal breath sounds.  Abdominal:     General: Abdomen is flat.     Palpations: Abdomen is soft.  Musculoskeletal:     Cervical back: Neck supple.  Lymphadenopathy:     Cervical: No cervical adenopathy.  Skin:    General: Skin is warm and dry.     Capillary Refill: Capillary refill takes less than 2 seconds.     Findings: No rash.  Neurological:     General: No focal deficit present.     Mental Status: [name] [name] is alert.     LABORATORY DATA:  CBC    Component Value Date/Time   WBC 6.0 10/04/2019 1328   WBC 6.8 06/21/2019 0505   RBC 3.64 (L) 10/04/2019 1328   HGB 10.7 (L) 10/04/2019 1328   HGB 9.9 (L) 09/29/2016 1146   HGB 10.5 (L) 08/28/2015 1313   HCT 35.2 (L) 10/04/2019 1328   HCT 31.0 (L) 09/29/2016 1146   HCT 32.7 (L) 08/28/2015 1313   PLT 270 10/04/2019 1328   PLT 277 09/29/2016 1146   MCV 96.7 10/04/2019 1328   MCV 93 09/29/2016 1146   MCV 90.6 08/28/2015 1313   MCH 29.4 10/04/2019 1328   MCHC 30.4 10/04/2019 1328   RDW 14.4 10/04/2019 1328   RDW 15.4 09/29/2016 1146   RDW 12.9 08/28/2015 1313   LYMPHSABS 2.3 10/04/2019 1328   LYMPHSABS 1.3 08/28/2015 1313   MONOABS 0.6 10/04/2019 1328   MONOABS 0.6  08/28/2015 1313   EOSABS 0.1 10/04/2019 1328   EOSABS 0.0 08/28/2015 1313   EOSABS 0.1 04/03/2010 1147   BASOSABS 0.0 10/04/2019 1328   BASOSABS 0.0 08/28/2015 1313    CMP     Component Value Date/Time   NA 144 10/04/2019 1328   NA 142 09/08/2016 1104   NA 141 12/10/2014 1012   K 4.4 10/04/2019 1328   K 3.7 12/10/2014 1012   CL 111 10/04/2019 1328   CL 98 02/28/2009 1250   CO2 24 10/04/2019 1328   CO2 25 12/10/2014 1012   GLUCOSE 95 10/04/2019 1328   GLUCOSE 104 12/10/2014 1012   GLUCOSE 103 02/28/2009 1250   BUN 25 (H) 10/04/2019 1328   BUN 21 09/08/2016 1104   BUN 15.7 12/10/2014 1012   CREATININE 1.25 (H) 10/04/2019 1328   CREATININE 1.01 (H) 09/16/2018 1703   CREATININE 0.9 12/10/2014 1012   CALCIUM 8.2 (L) 10/04/2019 1328   CALCIUM 9.2 12/10/2014 1012   PROT 6.6 10/04/2019 1328   PROT 6.2 10/27/2016 1015   PROT 6.8 12/10/2014 1012   ALBUMIN 3.3 (L) 10/04/2019 1328   ALBUMIN 3.6 10/27/2016 1015   ALBUMIN 3.5 12/10/2014 1012   AST 19 10/04/2019 1328   AST 18 12/10/2014 1012   ALT 12 10/04/2019 1328   ALT 13 12/10/2014 1012  ALKPHOS 62 10/04/2019 1328   ALKPHOS 61 12/10/2014 1012   BILITOT 0.3 10/04/2019 1328   BILITOT 0.47 12/10/2014 1012   GFRNONAA 41 (L) 10/04/2019 1328   GFRAA 47 (L) 10/04/2019 1328    ASSESSMENT and PLAN:   Anemia of chronic disease 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 ( anemia of chronic inflammation), patient has chronic polymyalgia rheumatica I reviewed [name] [name] blood counts with [name] [name]. 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  Labs reviewed  Current treatment:Retacrit 40,000 units, now every 3 weeks.   [name] [name] hemoglobin is 10.7 today.  Initially we were going to give [name] [name] the injection and adjust [name] [name] appointments, however after discussion with [name] [name], [name] [name] will return in 2 weeks for labs, f/u with Dr. Lindi Adie, and [name] [name] injection, and then receive [name] [name] injection every 3 weeks from there on out.     All questions were  answered. The patient knows to call the clinic with any problems, questions or concerns. We can certainly see the patient much sooner if necessary.  Total encounter time: 20 minutes*  This note was electronically signed. Wilber Bihari, NP 10/06/19 1:56 PM Medical Oncology and Hematology [facility] [place], [place] [address] Tel. [phone]    Fax. [phone]  *Total Encounter Time as defined by the Centers for Medicare and Medicaid Services includes, in addition to the face-to-face time of a patient visit (documented in the note above) non-face-to-face time: obtaining and reviewing outside history, ordering and reviewing medications, tests or procedures, care coordination (communications with other health care professionals or caregivers) and documentation in the medical record.

## 2019-10-05 ENCOUNTER — Other Ambulatory Visit: Payer: Medicare Other

## 2019-10-05 ENCOUNTER — Ambulatory Visit: Payer: Medicare Other | Admitting: Adult Health

## 2019-10-05 ENCOUNTER — Ambulatory Visit: Payer: Medicare Other

## 2019-10-05 ENCOUNTER — Ambulatory Visit: Payer: Medicare Other | Attending: Internal Medicine

## 2019-10-05 ENCOUNTER — Telehealth: Payer: Self-pay | Admitting: Hematology and Oncology

## 2019-10-05 DIAGNOSIS — Z23 Encounter for immunization: Secondary | ICD-10-CM

## 2019-10-05 NOTE — Telephone Encounter (Signed)
I talk with patient regarding schedule will get a print out at next appt

## 2019-10-05 NOTE — Progress Notes (Signed)
   Covid-19 Vaccination Clinic  Name:  [name] [name]    MRN: [id number] DOB: [birth year]  10/05/2019  [name] was observed post Covid-19 immunization for 15 minutes without incidence. She was provided with Vaccine Information Sheet and instruction to access the V-Safe system.   [name] was instructed to call 911 with any severe reactions post vaccine: Marland Kitchen Difficulty breathing  . Swelling of your face and throat  . A fast heartbeat  . A bad rash all over your body  . Dizziness and weakness    Immunizations Administered    Name Date Dose VIS Date Route   Pfizer COVID-19 Vaccine 10/05/2019 11:45 AM 0.3 mL 08/04/2019 Intramuscular   Manufacturer: [employer], [employer]   Lot: [id number]   [place]: [id number]

## 2019-10-06 ENCOUNTER — Encounter: Payer: Self-pay | Admitting: Adult Health

## 2019-10-06 NOTE — Assessment & Plan Note (Addendum)
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 ( anemia of chronic inflammation), patient has chronic polymyalgia rheumatica I reviewed her blood counts with her. 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  Labs reviewed  Current treatment:Retacrit 40,000 units, now every 3 weeks.   Her hemoglobin is 10.7 today.  Initially we were going to give her the injection and adjust her appointments, however after discussion with her, she will return in 2 weeks for labs, f/u with Dr. Lindi Adie, and her injection, and then receive her injection every 3 weeks from there on out.

## 2019-10-11 ENCOUNTER — Other Ambulatory Visit: Payer: Self-pay | Admitting: Cardiology

## 2019-10-18 NOTE — Progress Notes (Signed)
   Patient Care Team: Tower, [name], MD as PCP - General Ellyn Hack Leonie Green, MD as PCP - Cardiology (Cardiology) Suella Broad, MD as Consulting Physician (Physical Medicine and Rehabilitation) Gavin Pound, MD as Consulting Physician (Rheumatology) [place], Jennefer Bravo, MD as Referring Physician (Dermatology) Nicholas Lose, MD as Consulting Physician (Hematology and Oncology)  DIAGNOSIS:    ICD-10-CM   1. Anemia of chronic disease  D63.8     CHIEF COMPLIANT: Follow-up of anemia  INTERVAL HISTORY: [name] [name] is a 79 y.o. with above-mentioned history of anemia who currently receives Retacrit.She presents to the clinic todayfor follow-up. She is here today for Retacrit injection.  She reports mild fatigue.  Denies any shortness of breath with exertion.  ALLERGIES:  is allergic to sulfonamide derivatives; ace inhibitors; oxaprozin; pregabalin; and amoxicillin-pot clavulanate.  MEDICATIONS: Reviewed  PHYSICAL EXAMINATION: ECOG PERFORMANCE STATUS: 1 - Symptomatic but completely ambulatory  Vitals:   10/19/19 1509  BP: (!) 135/57  Pulse: 67  Resp: 16  Temp: 98.5 F (36.9 C)  SpO2: 100%   Filed Weights   10/19/19 1509  Weight: 172 lb 6.4 oz (78.2 kg)    LABORATORY DATA:  I have reviewed the data as listed CMP Latest Ref Rng & Units 10/19/2019 10/04/2019 09/21/2019  Glucose 70 - 99 mg/dL 88 95 93  BUN 8 - 23 mg/dL 28(H) 25(H) 25(H)  Creatinine 0.44 - 1.00 mg/dL 1.06(H) 1.25(H) 1.10(H)  Sodium 135 - 145 mmol/L 139 144 143  Potassium 3.5 - 5.1 mmol/L 4.6 4.4 4.1  Chloride 98 - 111 mmol/L 107 111 112(H)  CO2 22 - 32 mmol/L 26 24 24   Calcium 8.9 - 10.3 mg/dL 8.1(L) 8.2(L) 8.5(L)  Total Protein 6.5 - 8.1 g/dL 6.6 6.6 6.8  Total Bilirubin 0.3 - 1.2 mg/dL 0.3 0.3 0.2(L)  Alkaline Phos 38 - 126 U/L 65 62 68  AST 15 - 41 U/L 18 19 22   ALT 0 - 44 U/L 10 12 15     Lab Results  Component Value Date   WBC 5.8 10/19/2019   HGB 10.0 (L) 10/19/2019   HCT 33.3 (L)  10/19/2019   MCV 94.6 10/19/2019   PLT 222 10/19/2019   NEUTROABS 2.8 10/19/2019    ASSESSMENT & PLAN:  Anemia of chronic disease 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 ( anemia of chronic inflammation), patient has chronic polymyalgia rheumatica I reviewed her blood counts with her. 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  Lab review 10/12/2017: Hemoglobin 10.2, MCV 94, normal WBC and platelet counts, creatinine 1.02 04/13/2019:Hemoglobin 9 05/19/2019:Hemoglobin 9.1 07/13/2019: Hemoglobin 10.3 08/24/2019: Hemoglobin 10.2  Current treatment:Retacrit 40,000 unitscurrently receiving it every 3-weeks.     No orders of the defined types were placed in this encounter.  The patient has a good understanding of the overall plan. she agrees with it. she will call with any problems that may develop before the next visit here.  Total time spent: 30 mins including face to face time and time spent for planning, charting and coordination of care  Nicholas Lose, MD 10/19/2019  I, Cloyde Reams Dorshimer, am acting as scribe for Dr. Nicholas Lose.  I have reviewed the above documentation for accuracy and completeness, and I agree with the above.

## 2019-10-19 ENCOUNTER — Inpatient Hospital Stay: Payer: Medicare Other

## 2019-10-19 ENCOUNTER — Inpatient Hospital Stay: Payer: Medicare Other | Admitting: Hematology and Oncology

## 2019-10-19 ENCOUNTER — Other Ambulatory Visit: Payer: Self-pay

## 2019-10-19 DIAGNOSIS — M353 Polymyalgia rheumatica: Secondary | ICD-10-CM | POA: Diagnosis not present

## 2019-10-19 DIAGNOSIS — D638 Anemia in other chronic diseases classified elsewhere: Secondary | ICD-10-CM

## 2019-10-19 LAB — CBC WITH DIFFERENTIAL (CANCER CENTER ONLY)
Abs Immature Granulocytes: 0.02 10*3/uL (ref 0.00–0.07)
Basophils Absolute: 0 10*3/uL (ref 0.0–0.1)
Basophils Relative: 1 %
Eosinophils Absolute: 0.1 10*3/uL (ref 0.0–0.5)
Eosinophils Relative: 2 %
HCT: 33.3 % — ABNORMAL LOW (ref 36.0–46.0)
Hemoglobin: 10 g/dL — ABNORMAL LOW (ref 12.0–15.0)
Immature Granulocytes: 0 %
Lymphocytes Relative: 39 %
Lymphs Abs: 2.3 10*3/uL (ref 0.7–4.0)
MCH: 28.4 pg (ref 26.0–34.0)
MCHC: 30 g/dL (ref 30.0–36.0)
MCV: 94.6 fL (ref 80.0–100.0)
Monocytes Absolute: 0.5 10*3/uL (ref 0.1–1.0)
Monocytes Relative: 9 %
Neutro Abs: 2.8 10*3/uL (ref 1.7–7.7)
Neutrophils Relative %: 49 %
Platelet Count: 222 10*3/uL (ref 150–400)
RBC: 3.52 MIL/uL — ABNORMAL LOW (ref 3.87–5.11)
RDW: 13.8 % (ref 11.5–15.5)
WBC Count: 5.8 10*3/uL (ref 4.0–10.5)
nRBC: 0 % (ref 0.0–0.2)

## 2019-10-19 LAB — CMP (CANCER CENTER ONLY)
ALT: 10 U/L (ref 0–44)
AST: 18 U/L (ref 15–41)
Albumin: 3.4 g/dL — ABNORMAL LOW (ref 3.5–5.0)
Alkaline Phosphatase: 65 U/L (ref 38–126)
Anion gap: 6 (ref 5–15)
BUN: 28 mg/dL — ABNORMAL HIGH (ref 8–23)
CO2: 26 mmol/L (ref 22–32)
Calcium: 8.1 mg/dL — ABNORMAL LOW (ref 8.9–10.3)
Chloride: 107 mmol/L (ref 98–111)
Creatinine: 1.06 mg/dL — ABNORMAL HIGH (ref 0.44–1.00)
GFR, Est AFR Am: 57 mL/min — ABNORMAL LOW (ref >60)
GFR, Estimated: 50 mL/min — ABNORMAL LOW (ref >60)
Glucose, Bld: 88 mg/dL (ref 70–99)
Potassium: 4.6 mmol/L (ref 3.5–5.1)
Sodium: 139 mmol/L (ref 135–145)
Total Bilirubin: 0.3 mg/dL (ref 0.3–1.2)
Total Protein: 6.6 g/dL (ref 6.5–8.1)

## 2019-10-19 MED ORDER — EPOETIN ALFA-EPBX 40000 UNIT/ML IJ SOLN
INTRAMUSCULAR | Status: AC
  Filled 2019-10-19: qty 1

## 2019-10-19 MED ORDER — EPOETIN ALFA-EPBX 40000 UNIT/ML IJ SOLN
40000.0000 [IU] | Freq: Once | INTRAMUSCULAR | Status: AC
  Administered 2019-10-19: 40000 [IU] via SUBCUTANEOUS

## 2019-10-19 NOTE — Assessment & Plan Note (Signed)
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 ( anemia of chronic inflammation), patient has chronic polymyalgia rheumatica I reviewed her blood counts with her. 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  Lab review 10/12/2017: Hemoglobin 10.2, MCV 94, normal WBC and platelet counts, creatinine 1.02 04/13/2019:Hemoglobin 9 05/19/2019:Hemoglobin 9.1 07/13/2019: Hemoglobin 10.3 08/24/2019: Hemoglobin 10.2  Current treatment:Retacrit 40,000 unitscurrently receiving it every 3-weeks.

## 2019-10-20 ENCOUNTER — Telehealth: Payer: Self-pay | Admitting: Hematology and Oncology

## 2019-10-20 NOTE — Telephone Encounter (Signed)
I talk with patient regarding schedule  

## 2019-10-25 ENCOUNTER — Ambulatory Visit: Payer: Medicare Other

## 2019-10-25 ENCOUNTER — Ambulatory Visit: Payer: Medicare Other | Admitting: Cardiology

## 2019-10-26 ENCOUNTER — Telehealth: Payer: Self-pay | Admitting: Family Medicine

## 2019-10-26 DIAGNOSIS — E559 Vitamin D deficiency, unspecified: Secondary | ICD-10-CM

## 2019-10-26 DIAGNOSIS — Z0000 Encounter for general adult medical examination without abnormal findings: Secondary | ICD-10-CM

## 2019-10-26 DIAGNOSIS — D638 Anemia in other chronic diseases classified elsewhere: Secondary | ICD-10-CM

## 2019-10-26 DIAGNOSIS — E785 Hyperlipidemia, unspecified: Secondary | ICD-10-CM

## 2019-10-26 DIAGNOSIS — I10 Essential (primary) hypertension: Secondary | ICD-10-CM

## 2019-10-26 DIAGNOSIS — E786 Lipoprotein deficiency: Secondary | ICD-10-CM

## 2019-10-26 NOTE — Telephone Encounter (Signed)
-----   Message from Ellamae Sia sent at 10/17/2019  9:56 AM EST ----- Regarding: Lab orders for Fridday, 3.5.21 Patient is scheduled for CPX labs, please order future labs, Thanks , Karna Christmas

## 2019-10-27 ENCOUNTER — Other Ambulatory Visit: Payer: Medicare Other

## 2019-10-27 ENCOUNTER — Other Ambulatory Visit (INDEPENDENT_AMBULATORY_CARE_PROVIDER_SITE_OTHER): Payer: Medicare Other

## 2019-10-27 ENCOUNTER — Ambulatory Visit (INDEPENDENT_AMBULATORY_CARE_PROVIDER_SITE_OTHER): Payer: Medicare Other

## 2019-10-27 ENCOUNTER — Other Ambulatory Visit: Payer: Self-pay

## 2019-10-27 VITALS — Wt 171.0 lb

## 2019-10-27 DIAGNOSIS — Z0000 Encounter for general adult medical examination without abnormal findings: Secondary | ICD-10-CM | POA: Diagnosis not present

## 2019-10-27 DIAGNOSIS — I10 Essential (primary) hypertension: Secondary | ICD-10-CM

## 2019-10-27 DIAGNOSIS — E559 Vitamin D deficiency, unspecified: Secondary | ICD-10-CM

## 2019-10-27 DIAGNOSIS — E785 Hyperlipidemia, unspecified: Secondary | ICD-10-CM | POA: Diagnosis not present

## 2019-10-27 DIAGNOSIS — E786 Lipoprotein deficiency: Secondary | ICD-10-CM | POA: Diagnosis not present

## 2019-10-27 DIAGNOSIS — D638 Anemia in other chronic diseases classified elsewhere: Secondary | ICD-10-CM

## 2019-10-27 LAB — COMPREHENSIVE METABOLIC PANEL
ALT: 11 U/L (ref 0–35)
AST: 20 U/L (ref 0–37)
Albumin: 3.7 g/dL (ref 3.5–5.2)
Alkaline Phosphatase: 60 U/L (ref 39–117)
BUN: 25 mg/dL — ABNORMAL HIGH (ref 6–23)
CO2: 23 mEq/L (ref 19–32)
Calcium: 9 mg/dL (ref 8.4–10.5)
Chloride: 108 mEq/L (ref 96–112)
Creatinine, Ser: 1.21 mg/dL — ABNORMAL HIGH (ref 0.40–1.20)
GFR: 42.8 mL/min — ABNORMAL LOW (ref >60.00)
Glucose, Bld: 83 mg/dL (ref 70–99)
Potassium: 4.5 mEq/L (ref 3.5–5.1)
Sodium: 137 mEq/L (ref 135–145)
Total Bilirubin: 0.4 mg/dL (ref 0.2–1.2)
Total Protein: 6.9 g/dL (ref 6.0–8.3)

## 2019-10-27 LAB — VITAMIN D 25 HYDROXY (VIT D DEFICIENCY, FRACTURES): VITD: 23.96 ng/mL — ABNORMAL LOW (ref 30.00–100.00)

## 2019-10-27 LAB — CBC WITH DIFFERENTIAL/PLATELET
Basophils Absolute: 0 10*3/uL (ref 0.0–0.1)
Basophils Relative: 0.8 % (ref 0.0–3.0)
Eosinophils Absolute: 0.1 10*3/uL (ref 0.0–0.7)
Eosinophils Relative: 2.3 % (ref 0.0–5.0)
HCT: 33.3 % — ABNORMAL LOW (ref 36.0–46.0)
Hemoglobin: 10.6 g/dL — ABNORMAL LOW (ref 12.0–15.0)
Lymphocytes Relative: 49.2 % — ABNORMAL HIGH (ref 12.0–46.0)
Lymphs Abs: 2.4 10*3/uL (ref 0.7–4.0)
MCHC: 32 g/dL (ref 30.0–36.0)
MCV: 90.7 fl (ref 78.0–100.0)
Monocytes Absolute: 0.6 10*3/uL (ref 0.1–1.0)
Monocytes Relative: 11.6 % (ref 3.0–12.0)
Neutro Abs: 1.7 10*3/uL (ref 1.4–7.7)
Neutrophils Relative %: 36.1 % — ABNORMAL LOW (ref 43.0–77.0)
Platelets: 341 10*3/uL (ref 150.0–400.0)
RBC: 3.67 Mil/uL — ABNORMAL LOW (ref 3.87–5.11)
RDW: 16.3 % — ABNORMAL HIGH (ref 11.5–15.5)
WBC: 4.8 10*3/uL (ref 4.0–10.5)

## 2019-10-27 LAB — LIPID PANEL
Cholesterol: 136 mg/dL (ref 0–200)
HDL: 44.7 mg/dL (ref >39.00)
LDL Cholesterol: 55 mg/dL (ref 0–99)
NonHDL: 91.73
Total CHOL/HDL Ratio: 3
Triglycerides: 182 mg/dL — ABNORMAL HIGH (ref 0.0–149.0)
VLDL: 36.4 mg/dL (ref 0.0–40.0)

## 2019-10-27 LAB — TSH: TSH: 2.9 u[IU]/mL (ref 0.35–4.50)

## 2019-10-27 NOTE — Patient Instructions (Signed)
[name] [name] , Thank you for taking time to come for your Medicare Wellness Visit. I appreciate your ongoing commitment to your health goals. Please review the following plan we discussed and let me know if I can assist you in the future.   Screening recommendations/referrals: Colonoscopy: Up to date, completed no longer required Mammogram: Up to date, completed 02/15/2019 Bone Density: Up to date, completed 02/09/2018 Recommended yearly ophthalmology/optometry visit for glaucoma screening and checkup Recommended yearly dental visit for hygiene and checkup  Vaccinations: Influenza vaccine: Up to date, completed 06/30/2019 Pneumococcal vaccine: Completed series Tdap vaccine: decline Shingles vaccine: discussed    Advanced directives: Please bring a copy of your POA (Power of Attorney) and/or Living Will to your next appointment.   Conditions/risks identified: hypertension, hyperlipidemia  Next appointment: 11/01/2019 @ 9:30 am    Preventive Care 65 Years and Older, Female Preventive care refers to lifestyle choices and visits with your health care provider that can promote health and wellness. What does preventive care include?  A yearly physical exam. This is also called an annual well check.  Dental exams once or twice a year.  Routine eye exams. Ask your health care provider how often you should have your eyes checked.  Personal lifestyle choices, including:  Daily care of your teeth and gums.  Regular physical activity.  Eating a healthy diet.  Avoiding tobacco and drug use.  Limiting alcohol use.  Practicing safe sex.  Taking low-dose aspirin every day.  Taking vitamin and mineral supplements as recommended by your health care provider. What happens during an annual well check? The services and screenings done by your health care provider during your annual well check will depend on your age, overall health, lifestyle risk factors, and family history of  disease. Counseling  Your health care provider may ask you questions about your:  Alcohol use.  Tobacco use.  Drug use.  Emotional well-being.  Home and relationship well-being.  Sexual activity.  Eating habits.  History of falls.  Memory and ability to understand (cognition).  Work and work Statistician.  Reproductive health. Screening  You may have the following tests or measurements:  Height, weight, and BMI.  Blood pressure.  Lipid and cholesterol levels. These may be checked every 5 years, or more frequently if you are over 57 years old.  Skin check.  Lung cancer screening. You may have this screening every year starting at age 76 if you have a 30-pack-year history of smoking and currently smoke or have quit within the past 15 years.  Fecal occult blood test (FOBT) of the stool. You may have this test every year starting at age 44.  Flexible sigmoidoscopy or colonoscopy. You may have a sigmoidoscopy every 5 years or a colonoscopy every 10 years starting at age 81.  Hepatitis C blood test.  Hepatitis B blood test.  Sexually transmitted disease (STD) testing.  Diabetes screening. This is done by checking your blood sugar (glucose) after you have not eaten for a while (fasting). You may have this done every 1-3 years.  Bone density scan. This is done to screen for osteoporosis. You may have this done starting at age 29.  Mammogram. This may be done every 1-2 years. Talk to your health care provider about how often you should have regular mammograms. Talk with your health care provider about your test results, treatment options, and if necessary, the need for more tests. Vaccines  Your health care provider may recommend certain vaccines, such as:  Influenza  vaccine. This is recommended every year.  Tetanus, diphtheria, and acellular pertussis (Tdap, Td) vaccine. You may need a Td booster every 10 years.  Zoster vaccine. You may need this after age  23.  Pneumococcal 13-valent conjugate (PCV13) vaccine. One dose is recommended after age 1.  Pneumococcal polysaccharide (PPSV23) vaccine. One dose is recommended after age 74. Talk to your health care provider about which screenings and vaccines you need and how often you need them. This information is not intended to replace advice given to you by your health care provider. Make sure you discuss any questions you have with your health care provider. Document Released: 09/06/2015 Document Revised: 04/29/2016 Document Reviewed: 06/11/2015 Elsevier Interactive Patient Education  2017 [place] Prevention in the Home Falls can cause injuries. They can happen to people of all ages. There are many things you can do to make your home safe and to help prevent falls. What can I do on the outside of my home?  Regularly fix the edges of walkways and driveways and fix any cracks.  Remove anything that might make you trip as you walk through a door, such as a raised step or threshold.  Trim any bushes or trees on the path to your home.  Use bright outdoor lighting.  Clear any walking paths of anything that might make someone trip, such as rocks or tools.  Regularly check to see if handrails are loose or broken. Make sure that both sides of any steps have handrails.  Any raised decks and porches should have guardrails on the edges.  Have any leaves, snow, or ice cleared regularly.  Use sand or salt on walking paths during winter.  Clean up any spills in your garage right away. This includes oil or grease spills. What can I do in the bathroom?  Use night lights.  Install grab bars by the toilet and in the tub and shower. Do not use towel bars as grab bars.  Use non-skid mats or decals in the tub or shower.  If you need to sit down in the shower, use a plastic, non-slip stool.  Keep the floor dry. Clean up any water that spills on the floor as soon as it happens.  Remove  soap buildup in the tub or shower regularly.  Attach bath mats securely with double-sided non-slip rug tape.  Do not have throw rugs and other things on the floor that can make you trip. What can I do in the bedroom?  Use night lights.  Make sure that you have a light by your bed that is easy to reach.  Do not use any sheets or blankets that are too big for your bed. They should not hang down onto the floor.  Have a firm chair that has side arms. You can use this for support while you get dressed.  Do not have throw rugs and other things on the floor that can make you trip. What can I do in the kitchen?  Clean up any spills right away.  Avoid walking on wet floors.  Keep items that you use a lot in easy-to-reach places.  If you need to reach something above you, use a strong step stool that has a grab bar.  Keep electrical cords out of the way.  Do not use floor polish or wax that makes floors slippery. If you must use wax, use non-skid floor wax.  Do not have throw rugs and other things on the floor that can  make you trip. What can I do with my stairs?  Do not leave any items on the stairs.  Make sure that there are handrails on both sides of the stairs and use them. Fix handrails that are broken or loose. Make sure that handrails are as long as the stairways.  Check any carpeting to make sure that it is firmly attached to the stairs. Fix any carpet that is loose or worn.  Avoid having throw rugs at the top or bottom of the stairs. If you do have throw rugs, attach them to the floor with carpet tape.  Make sure that you have a light switch at the top of the stairs and the bottom of the stairs. If you do not have them, ask someone to add them for you. What else can I do to help prevent falls?  Wear shoes that:  Do not have high heels.  Have rubber bottoms.  Are comfortable and fit you well.  Are closed at the toe. Do not wear sandals.  If you use a  stepladder:  Make sure that it is fully opened. Do not climb a closed stepladder.  Make sure that both sides of the stepladder are locked into place.  Ask someone to hold it for you, if possible.  Clearly mark and make sure that you can see:  Any grab bars or handrails.  First and last steps.  Where the edge of each step is.  Use tools that help you move around (mobility aids) if they are needed. These include:  Canes.  Walkers.  Scooters.  Crutches.  Turn on the lights when you go into a dark area. Replace any light bulbs as soon as they burn out.  Set up your furniture so you have a clear path. Avoid moving your furniture around.  If any of your floors are uneven, fix them.  If there are any pets around you, be aware of where they are.  Review your medicines with your doctor. Some medicines can make you feel dizzy. This can increase your chance of falling. Ask your doctor what other things that you can do to help prevent falls. This information is not intended to replace advice given to you by your health care provider. Make sure you discuss any questions you have with your health care provider. Document Released: 06/06/2009 Document Revised: 01/16/2016 Document Reviewed: 09/14/2014 Elsevier Interactive Patient Education  2017 [employer].

## 2019-10-27 NOTE — Progress Notes (Signed)
Subjective:   [name] [name] is a 79 y.o. female who presents for Medicare Annual (Subsequent) preventive examination.  Review of Systems: N/A   This visit is being conducted through telemedicine via telephone at the nurse health advisor's home address due to the COVID-19 pandemic. This patient has given me verbal consent via doximity to conduct this visit, patient states they are participating from their home address. Patient and myself are on the telephone call. There is no referral for this visit. Some vital signs may be absent or patient reported.    Patient identification: identified by name, DOB, and current address   Cardiac Risk Factors include: advanced age (>75men, >37 women);hypertension;dyslipidemia     Objective:     Vitals: Wt 171 lb (77.6 kg)   BMI 28.90 kg/m   Body mass index is 28.9 kg/m.  Advanced Directives 10/27/2019 06/29/2019 06/18/2019 02/18/2019 10/19/2018 12/17/2017 12/09/2017  Does Patient Have a Medical Advance Directive? Yes Yes Yes Yes Yes Yes Yes  Type of Paramedic of [place];Living will [place];Living will [place];Living will [place];Living will [place];Living will [place];Living will [place];Living will  Does patient want to make changes to medical advance directive? - - - - No - Patient declined No - Patient declined -  Copy of [place] in Chart? No - copy requested No - copy requested - - - No - copy requested No - copy requested  Pre-existing out of facility DNR order (yellow form or pink MOST form) - - - - - - -    Tobacco Social History   Tobacco Use  Smoking Status Never Smoker  Smokeless Tobacco Never Used     Counseling given: Not Answered   Clinical Intake:  Pre-visit preparation completed: Yes  Pain : 0-10 Pain Score: 6  Pain Type: Chronic pain Pain Location:  Hand(Back) Pain Orientation: Left, Right Pain Descriptors / Indicators: Aching Pain Onset: More than a month ago Pain Frequency: Constant     Nutritional Risks: None Diabetes: No  How often do you need to have someone help you when you read instructions, pamphlets, or other written materials from your doctor or pharmacy?: 1 - Never What is the last grade level you completed in school?: 12th  Interpreter Needed?: No  Information entered by :: CJohnson, LPN  Past Medical History:  Diagnosis Date  . Anemia    of chronic disease  . Anxiety   . Arthritis    "psoratic arthritis; new dx" (09/11/2016)  . CAD S/P percutaneous coronary angioplasty 08/2016   mLCx 90% -> 0% (2.75 mm x 16 mm) Synergy DES PCI Ostial D1 70% (not PTCA target). Moderate ostial and mD2.  LAD tandem 50% stenoses w/ negative FFR (0.86)  . Cancer ([facility])    skin cancer on leg  . Chronic lower back pain   . Complication of anesthesia 1980 and 1982   trouble waking up after breast biopsy, many surgeries since no problem  . Depression   . Dyspnea on effort - CHRONIC    partial relief of Sx post PCI of L Circumflex.  . Fibrocystic breast   . GERD (gastroesophageal reflux disease)   . Headache    "monthly" (09/11/2016)  . History of blood transfusion    "several since age 53; I've had them after most of my surgeries" (09/11/2016)  . History of myocardial infarction    "doctor said  there are signs I've had a heart attack; don't know when"  - Myoview [name] 2018 indicated small Inferior - Inferoseptal Infarct (vs. rest ischemia). Echo with inferoseptal hypokinesis.  Marland Kitchen Hx of skin cancer, basal cell 2013 and 2012   right inner, lower leg; several scattered around my body  . Hypertension   . Iron deficiency anemia    "I've had an iron infusion"  . Osteoarthritis   . Osteopenia   . Peptic ulcer disease    H pylori  . PMR (polymyalgia rheumatica) (HCC)   . PONV (postoperative nausea and vomiting)   . Psoriasis    Past  Surgical History:  Procedure Laterality Date  . APPENDECTOMY    . BACK SURGERY     x 6   . BREAST BIOPSY Left 1980 and 1982   "benign"  . CARDIAC CATHETERIZATION N/A 09/11/2016   Procedure: Left Heart Cath and Coronary Angiography;  Surgeon: Leonie Man, MD;  Location: [facility];  Service: Cardiovascular: mLCx 90% * (PCI). Ostial D1 70% (not PTCA target). Mod ostial and mD2.  LAD tandem 50% stenoses w/ negative FFR (0.86)  . CARDIAC CATHETERIZATION N/A 09/11/2016   Procedure: Intravascular Pressure Wire/FFR Study;  Surgeon: Leonie Man, MD;  Location: [facility];  Service: Cardiovascular: --  LAD tandem 50% stenoses w/ negative FFR (0.86  . CARDIAC CATHETERIZATION N/A 09/11/2016   Procedure: Coronary Stent Intervention;  Surgeon: Leonie Man, MD;  Location: [facility];  Service: Cardiovascular: -- DES PCI mLCx 90%--0% SYNERGY DES 2.75 MM X 16 MM.  Marland Kitchen CARDIAC CATHETERIZATION  2001   non-obstructive CAD  . CATARACT EXTRACTION W/ INTRAOCULAR LENS  IMPLANT, BILATERAL  July -  August 2017  . DILATION AND CURETTAGE OF UTERUS  X 3  . ESOPHAGOGASTRODUODENOSCOPY  03/2009   ulcer,HP, GERD stricture  . EXCISIONAL HEMORRHOIDECTOMY    . HIP ARTHROPLASTY Left 2010  . JOINT REPLACEMENT    . KNEE ARTHROSCOPY Bilateral   . LAPAROSCOPIC CHOLECYSTECTOMY  2005  . LUMBAR DISC SURGERY  11/1984; 1987; 1989; 2004; 2005 X 2   deg. disk lumber spine   . MOHS SURGERY Right    "inside my lower leg"  . NASAL SINUS SURGERY  1970s  . NM MYOVIEW LTD  08/28/2016   INTERMEDIATE RISK (b/c reduced EF & ? small infarct) -- EF ~35-40%.  ? Prior Small Inferior-apical & septal infarct (w/o ischemia).  Difuse HK - worse in inferoapex.  Suggest prior infarct with no ischemia. Ischemic area correlates with echo WMA  . REVERSE SHOULDER ARTHROPLASTY Left 12/17/2017   Procedure: LEFT REVERSE SHOULDER ARTHROPLASTY;  Surgeon: Netta Cedars, MD;  Location: [facility];  Service: Orthopedics;  Laterality:  Left;  . TOTAL HIP ARTHROPLASTY Right 04/04/2013   Procedure: RIGHT TOTAL HIP ARTHROPLASTY ANTERIOR APPROACH;  Surgeon: Mauri Pole, MD;  Location: [facility];  Service: Orthopedics;  Laterality: Right;  . TOTAL KNEE ARTHROPLASTY Right 2009  . TRANSTHORACIC ECHOCARDIOGRAM  08/2016   mild conc LVH. EF 45-50% - anteroseptal & inferoseptal HK. Mod AI.  Marland Kitchen TRANSTHORACIC ECHOCARDIOGRAM  08/2017   (post PCI) --> EF normalized:  Normal LV systolic function; mild diastolic dysfunction; mild   LVH; sclerotic aortic valve with mild AI.  No RWMA  . TUBAL LIGATION    . VAGINAL HYSTERECTOMY  1970s   partial    Family History  Problem Relation Age of Onset  . Arthritis Mother   . Emphysema Mother   . Hyperlipidemia  Mother   . Hypertension Mother   . Heart disease Mother   . Heart disease Brother        CAD  . Diabetes Paternal Grandmother   . Heart disease Maternal Aunt    Social History   Socioeconomic History  . Marital status: Married    Spouse name: Not on file  . Number of children: Not on file  . Years of education: Not on file  . Highest education level: Not on file  Occupational History  . Not on file  Tobacco Use  . Smoking status: Never Smoker  . Smokeless tobacco: Never Used  Substance and Sexual Activity  . Alcohol use: Yes    Alcohol/week: 0.0 standard drinks    Comment: 09/11/2016 "might have a margarita q 3-4 months"  . Drug use: No  . Sexual activity: Not Currently  Other Topics Concern  . Not on file  Social History Narrative   Now Widowed as of [name] 2020.   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   Social Determinants of Health   Financial Resource Strain: Low Risk   . Difficulty of Paying Living Expenses: Not hard at all  Food Insecurity: No Food Insecurity  . Worried About Charity fundraiser in the  Last Year: Never true  . Ran Out of Food in the Last Year: Never true  Transportation Needs: No Transportation Needs  . Lack of Transportation (Medical): No  . Lack of Transportation (Non-Medical): No  Physical Activity: Inactive  . Days of Exercise per Week: 0 days  . Minutes of Exercise per Session: 0 min  Stress: No Stress Concern Present  . Feeling of Stress : Not at all  Social Connections:   . Frequency of Communication with Friends and Family: Not on file  . Frequency of Social Gatherings with Friends and Family: Not on file  . Attends Religious Services: Not on file  . Active Member of Clubs or Organizations: Not on file  . Attends Archivist Meetings: Not on file  . Marital Status: Not on file    Outpatient Encounter Medications as of 10/27/2019  Medication Sig  . acetaminophen (TYLENOL) 500 MG tablet Take 1,000 mg by mouth 2 (two) times daily as needed for moderate pain or headache.   Marland Kitchen atorvastatin (LIPITOR) 20 MG tablet TAKE 1 TABLET BY MOUTH EVERY DAY  . busPIRone (BUSPAR) 15 MG tablet TAKE 1/2 TO 1 TABLET (7.5 TO 15 MG TOTAL) BY MOUTH 2 TIMES DAILY. (Patient taking differently: Take 15 mg by mouth daily. )  . cetirizine (ZYRTEC) 10 MG tablet Take 1 tablet (10 mg total) by mouth daily.  . clopidogrel (PLAVIX) 75 MG tablet Take 1 tablet (75 mg total) by mouth daily with breakfast.  . esomeprazole (NEXIUM) 40 MG capsule esomeprazole magnesium 40 mg capsule,delayed release  . fluticasone (FLONASE) 50 MCG/ACT nasal spray Place 2 sprays into both nostrils daily.  . folic acid (FOLVITE) 1 MG tablet Take 2 mg by mouth daily.   Marland Kitchen HYDROcodone-acetaminophen (NORCO/VICODIN) 5-325 MG tablet Take 1-2 tablets by mouth every 4 (four) hours as needed for moderate pain or severe pain.  . isosorbide mononitrate (IMDUR) 60 MG 24 hr tablet Take 60 mg by mouth daily.  Marland Kitchen losartan-hydrochlorothiazide (HYZAAR) 50-12.5 MG tablet  TAKE 1 TABLET BY MOUTH EVERY DAY  . Melatonin 3 MG CAPS Take  9 mg by mouth at bedtime.   . methotrexate (RHEUMATREX) 2.5 MG tablet Take 6 tablets (15 mg total) by mouth once a week. (7 tablets)  . metoprolol tartrate (LOPRESSOR) 25 MG tablet TAKE 1 TABLET BY MOUTH TWICE A DAY  . pantoprazole (PROTONIX) 40 MG tablet Take 1 tablet (40 mg total) by mouth daily.  . raloxifene (EVISTA) 60 MG tablet Take 1 tablet (60 mg total) by mouth daily.  . Vitamin D, Cholecalciferol, 25 MCG (1000 UT) CAPS Take 1 capsule by mouth daily.  . colestipol (COLESTID) 1 g tablet Take 1 tablet (1 g total) by mouth 2 (two) times daily. (Patient not taking: Reported on 10/27/2019)  . nitroGLYCERIN (NITROSTAT) 0.4 MG SL tablet Place 1 tablet (0.4 mg total) under the tongue every 5 (five) minutes as needed for chest pain.  Marland Kitchen Oxymetazoline HCl (NASAL SPRAY) 0.05 % SOLN oxymetazoline-menthol 0.05 % nasal spray   2 sprays by nasal route.   No facility-administered encounter medications on file as of 10/27/2019.    Activities of Daily Living In your present state of health, do you have any difficulty performing the following activities: 10/27/2019  Hearing? N  Vision? N  Difficulty concentrating or making decisions? N  Walking or climbing stairs? N  Dressing or bathing? N  Doing errands, shopping? N  Preparing Food and eating ? N  Using the Toilet? N  In the past six months, have you accidently leaked urine? Y  Comment Patient wears depends daily  Do you have problems with loss of bowel control? N  Managing your Medications? N  Managing your Finances? N  Housekeeping or managing your Housekeeping? N  Some recent data might be hidden    Patient Care Team: Tower, [name], MD as PCP - General Ellyn Hack Leonie Green, MD as PCP - Cardiology (Cardiology) Suella Broad, MD as Consulting Physician (Physical Medicine and Rehabilitation) Gavin Pound, MD as Consulting Physician (Rheumatology) [place]ver[place], Jennefer Bravo, MD as Referring Physician (Dermatology) Nicholas Lose, MD as Consulting  Physician (Hematology and Oncology)    Assessment:   This is a routine wellness examination for Bobbyjo.  Exercise Activities and Dietary recommendations Current Exercise Habits: The patient does not participate in regular exercise at present, Exercise limited by: None identified  Goals    . Follow up with Primary Care Provider     Starting 10/19/2018, I will continue to take medications as prescribed and to keep appointments with PCP as scheduled.     . Patient Stated     10/27/2019, I will maintain and continue medications as prescribed.        Fall Risk Fall Risk  10/27/2019 10/19/2018 10/12/2017 08/11/2016 07/17/2013  Falls in the past year? 0 0 No Yes No  Comment - - - pt had accidental fall in kitchen caused injury to right knee and hip; pt denies tripping or losing balance -  Number falls in past yr: 0 - - 1 -  Injury with Fall? 0 - - Yes -  Risk for fall due to : Medication side effect - - - -  Follow up Falls evaluation completed;Falls prevention discussed - - Falls evaluation completed -   Is the patient's home free of loose throw rugs in walkways, pet beds, electrical cords, etc?   yes      Grab bars in the bathroom? no      Handrails on the stairs?  yes      Adequate lighting?   yes  Timed Get Up and Go performed: N/A  Depression Screen PHQ 2/9 Scores 10/27/2019 10/19/2018 10/12/2017 08/11/2016  PHQ - 2 Score 0 0 0 1  PHQ- 9 Score 0 0 7 -     Cognitive Function MMSE - Mini Mental State Exam 10/27/2019 10/19/2018 10/12/2017 08/11/2016  Orientation to time 5 5 5 5   Orientation to Place 5 5 5 5   Registration 3 3 3 3   Attention/ Calculation 5 0 0 0  Recall 3 3 3 3   Language- name 2 objects - 0 0 0  Language- repeat 1 1 1 1   Language- follow 3 step command - 3 3 3   Language- read & follow direction - 0 0 0  Write a sentence - 0 0 0  Copy design - 0 0 0  Total score - 20 20 20   Mini Cog  Mini-Cog screen was completed. Maximum score is 22. A value of 0 denotes this part  of the MMSE was not completed or the patient failed this part of the Mini-Cog screening.       Immunization History  Administered Date(s) Administered  . Fluad Quad(high Dose 65+) 06/30/2019  . Influenza,inj,Quad PF,6+ Mos 07/17/2013, 09/12/2016, 10/12/2017, 10/25/2018  . PFIZER SARS-COV-2 Vaccination 09/14/2019, 10/05/2019  . Pneumococcal Conjugate-13 10/12/2017  . Pneumococcal Polysaccharide-23 01/26/2008, 04/16/2010  . Td 03/28/1998, 01/26/2008    Qualifies for Shingles Vaccine: Yes  Screening Tests Health Maintenance  Topic Date Due  . TETANUS/TDAP  08/23/2020 (Originally 01/25/2018)  . MAMMOGRAM  02/15/2020  . INFLUENZA VACCINE  Completed  . DEXA SCAN  Completed  . PNA vac Low Risk Adult  Completed    Cancer Screenings: Lung: Low Dose CT Chest recommended if Age 82-80 years, 30 pack-year currently smoking OR have quit w/in 15years. Patient does not qualify. Breast:  Up to date on Mammogram? Yes, completed 02/15/2019  Up to date of Bone Density/Dexa? Yes, completed 02/09/2018 Colorectal: no longer required  Additional Screenings:  Hepatitis C Screening: N/A     Plan:    Patient will maintain and continue medications as prescribed.    I have personally reviewed and noted the following in the patient's chart:   . Medical and social history . Use of alcohol, tobacco or illicit drugs  . Current medications and supplements . Functional ability and status . Nutritional status . Physical activity . Advanced directives . List of other physicians . Hospitalizations, surgeries, and ER visits in previous 12 months . Vitals . Screenings to include cognitive, depression, and falls . Referrals and appointments  In addition, I have reviewed and discussed with patient certain preventive protocols, quality metrics, and best practice recommendations. A written personalized care plan for preventive services as well as general preventive health recommendations were provided to  patient.     Andrez Grime, LPN  09/30/9483

## 2019-10-27 NOTE — Progress Notes (Signed)
PCP notes:  Health Maintenance: No gaps noted   Abnormal Screenings: none   Patient concerns: none   Nurse concerns: none    Next PCP appt.: 11/01/2019 @ 9:30 am

## 2019-11-01 ENCOUNTER — Encounter: Payer: Self-pay | Admitting: Family Medicine

## 2019-11-01 ENCOUNTER — Other Ambulatory Visit: Payer: Self-pay

## 2019-11-01 ENCOUNTER — Ambulatory Visit (INDEPENDENT_AMBULATORY_CARE_PROVIDER_SITE_OTHER): Payer: Medicare Other | Admitting: Family Medicine

## 2019-11-01 VITALS — BP 122/68 | HR 70 | Temp 97.3°F | Ht 63.25 in | Wt 172.6 lb

## 2019-11-01 DIAGNOSIS — N289 Disorder of kidney and ureter, unspecified: Secondary | ICD-10-CM

## 2019-11-01 DIAGNOSIS — M353 Polymyalgia rheumatica: Secondary | ICD-10-CM

## 2019-11-01 DIAGNOSIS — I10 Essential (primary) hypertension: Secondary | ICD-10-CM

## 2019-11-01 DIAGNOSIS — E785 Hyperlipidemia, unspecified: Secondary | ICD-10-CM

## 2019-11-01 DIAGNOSIS — E669 Obesity, unspecified: Secondary | ICD-10-CM

## 2019-11-01 DIAGNOSIS — F411 Generalized anxiety disorder: Secondary | ICD-10-CM

## 2019-11-01 DIAGNOSIS — E786 Lipoprotein deficiency: Secondary | ICD-10-CM

## 2019-11-01 DIAGNOSIS — M8580 Other specified disorders of bone density and structure, unspecified site: Secondary | ICD-10-CM

## 2019-11-01 DIAGNOSIS — Z0000 Encounter for general adult medical examination without abnormal findings: Secondary | ICD-10-CM | POA: Diagnosis not present

## 2019-11-01 DIAGNOSIS — E559 Vitamin D deficiency, unspecified: Secondary | ICD-10-CM

## 2019-11-01 DIAGNOSIS — D638 Anemia in other chronic diseases classified elsewhere: Secondary | ICD-10-CM

## 2019-11-01 DIAGNOSIS — T380X5A Adverse effect of glucocorticoids and synthetic analogues, initial encounter: Secondary | ICD-10-CM

## 2019-11-01 DIAGNOSIS — E2839 Other primary ovarian failure: Secondary | ICD-10-CM

## 2019-11-01 MED ORDER — BUSPIRONE HCL 15 MG PO TABS: 15.0000 mg | ORAL_TABLET | Freq: Every day | ORAL | 3 refills | Status: DC

## 2019-11-01 NOTE — Assessment & Plan Note (Signed)
dexa will be due 6/21- ref done and pt given # to schedule at [facility]  No falls or fx  D level still low inst to inc D3 to 4000 iu daily (she voiced understanding and given in writing)  Enc to exercise if /when able

## 2019-11-01 NOTE — Assessment & Plan Note (Signed)
Level in 20s Asked pt to inc D3 to 4000 iu daily  Disc importance to bone and overall health

## 2019-11-01 NOTE — Progress Notes (Signed)
Subjective:    Patient ID: [name] [name], female    DOB: [name] 12, 1941, 79 y.o.   MRN: [id number]  This visit occurred during the SARS-CoV-2 public health emergency.  Safety protocols were in place, including screening questions prior to the visit, additional usage of staff PPE, and extensive cleaning of exam room while observing appropriate contact time as indicated for disinfecting solutions.    HPI Here for health maintenance exam and to review chronic medical problems   Feels ok in general  Taking care of herself  Still goes to the shop every day -stays busy  Does not sleep well (this is not new)   Weight  Wt Readings from Last 3 Encounters:  11/01/19 172 lb 9 oz (78.3 kg)  10/27/19 171 lb (77.6 kg)  10/19/19 172 lb 6.4 oz (78.2 kg)  stable wt /obese  Does not eat "real well"  Drinking more water -which is good  Exercise -not a lot because she aches all over from pmr (not as mobile anymore)  30.33 kg/m   She had amw on 3/5 No care gaps noted   Mammogram 6/20 Self breast exam - no lumps   She had her covid vaccines    Zoster status -had zostavax    dexa 6/19 -osteopenia On evista since 2016 Falls-none  Fractures -none  Supplements - 1000 iu vit D  D level is low at 23.9  Exercise -none   Colonoscopy 2010-nl with no recall due to age   Hypertension  (in setting of CAD and aortic regurge) bp is stable today  No cp or palpitations or headaches or edema  No side effects to medicines  BP Readings from Last 3 Encounters:  11/01/19 122/68  10/19/19 (!) 135/57  10/04/19 (!) 148/67     Pulse Readings from Last 3 Encounters:  11/01/19 70  10/19/19 67  10/04/19 78     Anemia of chronic disease  Sees hematology Dr [facility]i Adie  tx with retacrit every 3 wk  (per pt 4 wk)  Lab Results  Component Value Date   WBC 4.8 10/27/2019   HGB 10.6 (L) 10/27/2019   HCT 33.3 (L) 10/27/2019   MCV 90.7 10/27/2019   PLT 341.0 10/27/2019   This is very expensive and she may  not continue therapy    gen anx disorder Taking buspar  Overall stable  Hyperlipidemia Lab Results  Component Value Date   CHOL 136 10/27/2019   CHOL 105 10/19/2018   CHOL 151 10/12/2017   Lab Results  Component Value Date   HDL 44.70 10/27/2019   HDL 36.00 (L) 10/19/2018   HDL 44.00 10/12/2017   Lab Results  Component Value Date   LDLCALC 55 10/27/2019   LDLCALC 52 10/27/2016   LDLCALC 97 05/25/2012   Lab Results  Component Value Date   TRIG 182.0 (H) 10/27/2019   TRIG 131 06/18/2019   TRIG 227.0 (H) 10/19/2018   Lab Results  Component Value Date   CHOLHDL 3 10/27/2019   CHOLHDL 3 10/19/2018   CHOLHDL 3 10/12/2017   Lab Results  Component Value Date   LDLDIRECT 35.0 10/19/2018   LDLDIRECT 74.0 10/12/2017   LDLDIRECT 107.0 12/10/2015   atorvastatin 20 mg   PMR Sees rheumatology Dr Lenna Gilford  Taking methotrexate Also has bad arthritis in her hands (sees Dr Amedeo Plenty and may have some joint surgery)    Lab Results  Component Value Date   CREATININE 1.21 (H) 10/27/2019   BUN 25 (H) 10/27/2019  NA 137 10/27/2019   K 4.5 10/27/2019   CL 108 10/27/2019   CO2 23 10/27/2019   Patient Active Problem List   Diagnosis Date Noted  . Renal insufficiency 11/01/2019  . Hyponatremia 06/19/2019  . Pedal edema 10/04/2018  . Grief reaction 09/17/2018  . 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 Routine general medical examination at a health care facility 10/14/2017  . Preop cardiovascular exam 10/01/2017  . CAD S/P percutaneous coronary angioplasty 01/27/2017  . Aortic regurgitation 01/27/2017  . Dyspnea on exertion 08/19/2016  . Left bundle branch block (LBBB) on electrocardiogram 08/19/2016  . Estrogen deficiency 10/08/2015  . Chronic fatigue 08/20/2015  . Deficiency anemia 11/17/2013  . Encounter for Medicare annual wellness exam 07/17/2013  . PMR (polymyalgia rheumatica) (HCC) 07/17/2013  . Intertrigo 04/19/2013  . Expected blood loss anemia 04/05/2013    . Obesity (BMI 30-39.9) 04/05/2013  . 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 IBS (irritable bowel syndrome) 05/30/2012  . Anemia of chronic disease 04/16/2010  . Generalized anxiety disorder 04/16/2010  . GERD 03/04/2009  . Vitamin D deficiency 03/12/2008  . URINARY TRACT INFECTION, RECURRENT 08/07/2007  . 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 FIBROCYSTIC BREAST DISEASE, HX OF 02/11/2007  . Hyperlipidemia with low HDL 01/10/2007  . DEPRESSION 01/10/2007  . RETINAL VEIN OCCLUSION 01/10/2007  . Essential hypertension 01/10/2007  . PEPTIC ULCER DISEASE 01/10/2007  . 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 SKIN CANCER, HX OF 01/10/2007   Past Medical History:  Diagnosis Date  . Anemia    of chronic disease  . Anxiety   . Arthritis    "psoratic arthritis; new dx" (09/11/2016)  . CAD S/P percutaneous coronary angioplasty 08/2016   mLCx 90% -> 0% (2.75 mm x 16 mm) Synergy DES PCI Ostial D1 70% (not PTCA target). Moderate ostial and mD2.  LAD tandem 50% stenoses w/ negative FFR (0.86)  . Cancer ([place])    skin cancer on leg  . Chronic lower back pain   . Complication of anesthesia 1980 and 1982   trouble waking up after breast biopsy, many surgeries since no problem  . Depression   . Dyspnea on effort - CHRONIC    partial relief of Sx post PCI of L Circumflex.  . Fibrocystic breast   . GERD (gastroesophageal reflux disease)   . Headache    "monthly" (09/11/2016)  . History of blood transfusion    "several since age 13; I've had them after most of my surgeries" (09/11/2016)  . History of myocardial infarction    "doctor said there are signs I've had a heart attack; don't know when"  - Myoview [name] 2018 indicated small Inferior - Inferoseptal Infarct (vs. rest ischemia). Echo with inferoseptal hypokinesis.  Marland Kitchen Hx of skin cancer, basal cell 2013 and 2012   right inner, lower leg; several scattered around my body  . Hypertension   . Iron deficiency anemia    "I've had an iron infusion"  .  Osteoarthritis   . Osteopenia   . Peptic ulcer disease    H pylori  . PMR (polymyalgia rheumatica) (HCC)   . PONV (postoperative nausea and vomiting)   . Psoriasis    Past Surgical History:  Procedure Laterality Date  . APPENDECTOMY    . BACK SURGERY     x 6   . BREAST BIOPSY Left 1980 and 1982   "benign"  . CARDIAC CATHETERIZATION N/A 09/11/2016   Procedure: Left Heart Cath and Coronary Angiography;  Surgeon: Leonie Man, MD;  Location: [facility];  Service: Cardiovascular: mLCx 90% * (PCI). Ostial D1 70% (not PTCA target). Mod ostial and mD2.  LAD tandem 50% stenoses w/ negative FFR (0.86)  . CARDIAC CATHETERIZATION N/A 09/11/2016   Procedure: Intravascular Pressure Wire/FFR Study;  Surgeon: Leonie Man, MD;  Location: [facility];  Service: Cardiovascular: --  LAD tandem 50% stenoses w/ negative FFR (0.86  . CARDIAC CATHETERIZATION N/A 09/11/2016   Procedure: Coronary Stent Intervention;  Surgeon: Leonie Man, MD;  Location: [facility];  Service: Cardiovascular: -- DES PCI mLCx 90%--0% SYNERGY DES 2.75 MM X 16 MM.  Marland Kitchen CARDIAC CATHETERIZATION  2001   non-obstructive CAD  . CATARACT EXTRACTION W/ INTRAOCULAR LENS  IMPLANT, BILATERAL  July -  August 2017  . DILATION AND CURETTAGE OF UTERUS  X 3  . ESOPHAGOGASTRODUODENOSCOPY  03/2009   ulcer,HP, GERD stricture  . EXCISIONAL HEMORRHOIDECTOMY    . HIP ARTHROPLASTY Left 2010  . JOINT REPLACEMENT    . KNEE ARTHROSCOPY Bilateral   . LAPAROSCOPIC CHOLECYSTECTOMY  2005  . LUMBAR DISC SURGERY  11/1984; 1987; 1989; 2004; 2005 X 2   deg. disk lumber spine   . MOHS SURGERY Right    "inside my lower leg"  . NASAL SINUS SURGERY  1970s  . NM MYOVIEW LTD  08/28/2016   INTERMEDIATE RISK (b/c reduced EF & ? small infarct) -- EF ~35-40%.  ? Prior Small Inferior-apical & septal infarct (w/o ischemia).  Difuse HK - worse in inferoapex.  Suggest prior infarct with no ischemia. Ischemic area correlates with echo WMA  .  REVERSE SHOULDER ARTHROPLASTY Left 12/17/2017   Procedure: LEFT REVERSE SHOULDER ARTHROPLASTY;  Surgeon: Netta Cedars, MD;  Location: [facility];  Service: Orthopedics;  Laterality: Left;  . TOTAL HIP ARTHROPLASTY Right 04/04/2013   Procedure: RIGHT TOTAL HIP ARTHROPLASTY ANTERIOR APPROACH;  Surgeon: Mauri Pole, MD;  Location: [facility];  Service: Orthopedics;  Laterality: Right;  . TOTAL KNEE ARTHROPLASTY Right 2009  . TRANSTHORACIC ECHOCARDIOGRAM  08/2016   mild conc LVH. EF 45-50% - anteroseptal & inferoseptal HK. Mod AI.  Marland Kitchen TRANSTHORACIC ECHOCARDIOGRAM  08/2017   (post PCI) --> EF normalized:  Normal LV systolic function; mild diastolic dysfunction; mild   LVH; sclerotic aortic valve with mild AI.  No RWMA  . TUBAL LIGATION    . VAGINAL HYSTERECTOMY  1970s   partial    Social History   Tobacco Use  . Smoking status: Never Smoker  . Smokeless tobacco: Never Used  Substance Use Topics  . Alcohol use: Yes    Alcohol/week: 0.0 standard drinks    Comment: 09/11/2016 "might have a margarita q 3-4 months"  . Drug use: No   Family History  Problem Relation Age of Onset  . Arthritis Mother   . Emphysema Mother   . Hyperlipidemia Mother   . Hypertension Mother   . Heart disease Mother   . Heart disease Brother        CAD  . Diabetes Paternal Grandmother   . Heart disease Maternal Aunt    Allergies  Allergen Reactions  . 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 Ace Inhibitors     cough  . Oxaprozin     GI upset   . Pregabalin Swelling    SWELLING REACTION UNSPECIFIED   . Amoxicillin-Pot Clavulanate Nausea And Vomiting    gi upset Has patient had a PCN reaction causing immediate rash, facial/tongue/throat  swelling, SOB or lightheadedness with hypotension: No Has patient had a PCN reaction causing severe rash involving mucus membranes or skin necrosis: No Has patient had a PCN reaction that required hospitalization No Has patient had a PCN  reaction occurring within the last 10 years: No If all of the above answers are "NO", then may proceed with Cephalosporin use.    Current Outpatient Medications on File Prior to Visit  Medication Sig Dispense Refill  . acetaminophen (TYLENOL) 500 MG tablet Take 1,000 mg by mouth 2 (two) times daily as needed for moderate pain or headache.     Marland Kitchen atorvastatin (LIPITOR) 20 MG tablet TAKE 1 TABLET BY MOUTH EVERY DAY 90 tablet 3  . cetirizine (ZYRTEC) 10 MG tablet Take 1 tablet (10 mg total) by mouth daily. 30 tablet 11  . clopidogrel (PLAVIX) 75 MG tablet Take 1 tablet (75 mg total) by mouth daily with breakfast. 90 tablet 3  . esomeprazole (NEXIUM) 40 MG capsule esomeprazole magnesium 40 mg capsule,delayed release    . fluticasone (FLONASE) 50 MCG/ACT nasal spray Place 2 sprays into both nostrils daily. 16 g 6  . HYDROcodone-acetaminophen (NORCO/VICODIN) 5-325 MG tablet Take 1-2 tablets by mouth every 4 (four) hours as needed for moderate pain or severe pain. 30 tablet 0  . losartan-hydrochlorothiazide (HYZAAR) 50-12.5 MG tablet TAKE 1 TABLET BY MOUTH EVERY DAY 90 tablet 1  . Melatonin 3 MG CAPS Take 9 mg by mouth at bedtime.     . methotrexate (RHEUMATREX) 2.5 MG tablet Take 6 tablets (15 mg total) by mouth once a week. (7 tablets) 4 tablet   . metoprolol tartrate (LOPRESSOR) 25 MG tablet TAKE 1 TABLET BY MOUTH TWICE A DAY 180 tablet 1  . pantoprazole (PROTONIX) 40 MG tablet Take 1 tablet (40 mg total) by mouth daily. 90 tablet 1  . Vitamin D, Cholecalciferol, 25 MCG (1000 UT) CAPS Take 4 capsules by mouth daily.     . nitroGLYCERIN (NITROSTAT) 0.4 MG SL tablet Place 1 tablet (0.4 mg total) under the tongue every 5 (five) minutes as needed for chest pain. 25 tablet 5   No current facility-administered medications on file prior to visit.    Review of Systems  Constitutional: Positive for fatigue. Negative for activity change, appetite change, fever and unexpected weight change.  HENT: Negative  for congestion, ear pain, rhinorrhea, sinus pressure and sore throat.   Eyes: Negative for pain, redness and visual disturbance.  Respiratory: Negative for cough, shortness of breath and wheezing.   Cardiovascular: Negative for chest pain and palpitations.  Gastrointestinal: Negative for abdominal pain, blood in stool, constipation and diarrhea.  Endocrine: Negative for polydipsia and polyuria.  Genitourinary: Negative for dysuria, frequency and urgency.  Musculoskeletal: Positive for arthralgias, back pain, gait problem and myalgias.  Skin: Negative for pallor and rash.  Allergic/Immunologic: Negative for environmental allergies.  Neurological: Negative for dizziness, syncope and headaches.  Hematological: Negative for adenopathy. Does not bruise/bleed easily.  Psychiatric/Behavioral: Negative for decreased concentration and dysphoric mood. The patient is not nervous/anxious.        Objective:   Physical Exam Constitutional:      General: She is not in acute distress.    Appearance: Normal appearance. She is well-developed. She is obese. She is not ill-appearing or diaphoretic.  HENT:     Head: Normocephalic and atraumatic.     Right Ear: Tympanic membrane, ear canal and external ear normal.     Left Ear: Tympanic membrane, ear canal and external ear  normal.     Nose: Nose normal. No congestion.     Mouth/Throat:     Mouth: Mucous membranes are moist.     Pharynx: Oropharynx is clear. No posterior oropharyngeal erythema.  Eyes:     General: No scleral icterus.    Extraocular Movements: Extraocular movements intact.     Conjunctiva/sclera: Conjunctivae normal.     Pupils: Pupils are equal, round, and reactive to light.  Neck:     Thyroid: No thyromegaly.     Vascular: No carotid bruit or JVD.  Cardiovascular:     Rate and Rhythm: Normal rate and regular rhythm.     Pulses: Normal pulses.     Heart sounds: Normal heart sounds. No gallop.   Pulmonary:     Effort: Pulmonary  effort is normal. No respiratory distress.     Breath sounds: Normal breath sounds. No wheezing.     Comments: Good air exch Chest:     Chest wall: No tenderness.  Abdominal:     General: Bowel sounds are normal. There is no distension or abdominal bruit.     Palpations: Abdomen is soft. There is no mass.     Tenderness: There is no abdominal tenderness.     Hernia: No hernia is present.  Genitourinary:    Comments: Breast exam: No mass, nodules, thickening, tenderness, bulging, retraction, inflamation, nipple discharge or skin changes noted.  No axillary or clavicular LA.     Exam done in chair Musculoskeletal:        General: No tenderness. Normal range of motion.     Cervical back: Normal range of motion and neck supple. No rigidity. No muscular tenderness.     Right lower leg: No edema.     Left lower leg: No edema.     Comments: Could not get on table due to joint pain   No kyphosis   Lymphadenopathy:     Cervical: No cervical adenopathy.  Skin:    General: Skin is warm and dry.     Coloration: Skin is not pale.     Findings: No erythema or rash.     Comments: Solar lentigines diffusely Some sks  Neurological:     Mental Status: She is alert. Mental status is at baseline.     Cranial Nerves: No cranial nerve deficit.     Motor: No abnormal muscle tone.     Coordination: Coordination normal.     Gait: Gait normal.     Deep Tendon Reflexes: Reflexes are normal and symmetric. Reflexes normal.  Psychiatric:        Mood and Affect: Mood normal.        Cognition and Memory: Cognition and memory normal.     Comments: Mentally sharp            Assessment & Plan:   Problem List Items Addressed This Visit      Cardiovascular and Mediastinum   Essential hypertension (Chronic)    bp in fair control at this time  BP Readings from Last 1 Encounters:  11/01/19 122/68   No changes needed Most recent labs reviewed  Disc lifstyle change with low sodium diet and exercise            Musculoskeletal and Integument   Steroid-induced osteopenia    dexa will be due 6/21- ref done and pt given # to schedule at [facility]  No falls or fx  D level still low inst to inc D3 to 4000 iu daily (she  voiced understanding and given in writing)  Enc to exercise if /when able        Genitourinary   Renal insufficiency    Cr of 1.21  Will watch this  Is on arb/hctz  Enc more water and avoidance of uti if we can         Other   Hyperlipidemia with low HDL (Chronic)    Disc goals for lipids and reasons to control them Rev last labs with pt Rev low sat fat diet in detail  Tolerating atorvastatin (in setting of PMR which gives her pain)      Vitamin D deficiency    Level in 20s Asked pt to inc D3 to 4000 iu daily  Disc importance to bone and overall health      Anemia of chronic disease    Continues hematology f/u with Dr Lindi Adie for retacrit every 3-4 weeks Doing better  Hb 10.6      Generalized anxiety disorder    Continues to do well/function with buspar  Will continue this  Reviewed stressors/ coping techniques/symptoms/ support sources/ tx options and side effects in detail today        Relevant Medications   busPIRone (BUSPAR) 15 MG tablet   Obesity (BMI 30-39.9)    Discussed how this problem influences overall health and the risks it imposes  Reviewed plan for weight loss with lower calorie diet (via better food choices and also portion control or program like weight watchers) and exercise building up to or more than 30 minutes 5 days per week including some aerobic activity   Pt is mobility impaired Suggested a chair exercise program      PMR (polymyalgia rheumatica) (HCC)    Under care of rheumatologist-taking methotrexate Is mobility impaired due to pain       Estrogen deficiency   Relevant Orders   DG Bone Density   Routine general medical examination at a health care facility - Primary    Reviewed health habits including diet and  exercise and skin cancer prevention Reviewed appropriate screening tests for age  Also reviewed health mt list, fam hx and immunization status , as well as social and family history   See HPI Labs reviwed  amw reviewed  mammo and dexa due in June-pt plans to schedule and referral done for dexa  No falls or fx  Plan to inc vit D3 to 4000 iu daily  Pt had covid vaccines  May consider shingrix

## 2019-11-01 NOTE — Assessment & Plan Note (Signed)
Continues to do well/function with buspar  Will continue this  Reviewed stressors/ coping techniques/symptoms/ support sources/ tx options and side effects in detail today

## 2019-11-01 NOTE — Assessment & Plan Note (Signed)
Continues hematology f/u with Dr Lindi Adie for retacrit every 3-4 weeks Doing better  Hb 10.6

## 2019-11-01 NOTE — Assessment & Plan Note (Signed)
Discussed how this problem influences overall health and the risks it imposes  Reviewed plan for weight loss with lower calorie diet (via better food choices and also portion control or program like weight watchers) and exercise building up to or more than 30 minutes 5 days per week including some aerobic activity   Pt is mobility impaired Suggested a chair exercise program

## 2019-11-01 NOTE — Assessment & Plan Note (Signed)
Cr of 1.21  Will watch this  Is on arb/hctz  Enc more water and avoidance of uti if we can

## 2019-11-01 NOTE — Assessment & Plan Note (Addendum)
Reviewed health habits including diet and exercise and skin cancer prevention Reviewed appropriate screening tests for age  Also reviewed health mt list, fam hx and immunization status , as well as social and family history   See HPI Labs reviwed  amw reviewed  mammo and dexa due in June-pt plans to schedule and referral done for dexa  No falls or fx  Plan to inc vit D3 to 4000 iu daily  Pt had covid vaccines  May consider shingrix

## 2019-11-01 NOTE — Assessment & Plan Note (Signed)
bp in fair control at this time  BP Readings from Last 1 Encounters:  11/01/19 122/68   No changes needed Most recent labs reviewed  Disc lifstyle change with low sodium diet and exercise

## 2019-11-01 NOTE — Patient Instructions (Addendum)
If you are interested in the new shingles vaccine (Shingrix) - call your local pharmacy to check on coverage and availability  If affordable, get on a wait list at your pharmacy to get the vaccine.  Try to eat a healthy diet Try to get most of your carbohydrates from produce (with the exception of white potatoes)  Eat less bread/pasta/rice/snack foods/cereals/sweets and other items from the middle of the grocery store (processed carbs)   Stay as active as you can safely be   You can stop the evista since you completed approx 5 years   Please schedule your mammogram and dexa at [facility] late June or later   Increase your vitamin D over the counter to 4000 iu daily   Keep drinking water for kidney health -your kidney numbers are slightly high

## 2019-11-01 NOTE — Assessment & Plan Note (Signed)
Under care of rheumatologist-taking methotrexate Is mobility impaired due to pain

## 2019-11-01 NOTE — Assessment & Plan Note (Signed)
Disc goals for lipids and reasons to control them Rev last labs with pt Rev low sat fat diet in detail  Tolerating atorvastatin (in setting of PMR which gives her pain)

## 2019-11-02 ENCOUNTER — Telehealth: Payer: Self-pay

## 2019-11-02 ENCOUNTER — Ambulatory Visit: Payer: Medicare Other

## 2019-11-02 ENCOUNTER — Other Ambulatory Visit: Payer: Medicare Other

## 2019-11-02 NOTE — Telephone Encounter (Signed)
DEXA order faxed to [facility].  Sent pt letter to contact [facility] to schedule DEXA.

## 2019-11-09 ENCOUNTER — Ambulatory Visit: Payer: Medicare Other

## 2019-11-09 ENCOUNTER — Other Ambulatory Visit: Payer: Medicare Other

## 2019-11-16 ENCOUNTER — Encounter: Payer: Self-pay | Admitting: Cardiology

## 2019-11-16 ENCOUNTER — Ambulatory Visit: Payer: Medicare Other | Admitting: Cardiology

## 2019-11-16 ENCOUNTER — Other Ambulatory Visit: Payer: Self-pay

## 2019-11-16 ENCOUNTER — Inpatient Hospital Stay: Payer: Medicare Other

## 2019-11-16 ENCOUNTER — Inpatient Hospital Stay: Payer: Medicare Other | Attending: Hematology and Oncology

## 2019-11-16 VITALS — BP 120/64 | HR 64 | Ht 63.25 in | Wt 175.2 lb

## 2019-11-16 DIAGNOSIS — I10 Essential (primary) hypertension: Secondary | ICD-10-CM

## 2019-11-16 DIAGNOSIS — Z9861 Coronary angioplasty status: Secondary | ICD-10-CM

## 2019-11-16 DIAGNOSIS — E785 Hyperlipidemia, unspecified: Secondary | ICD-10-CM | POA: Diagnosis not present

## 2019-11-16 DIAGNOSIS — M353 Polymyalgia rheumatica: Secondary | ICD-10-CM | POA: Insufficient documentation

## 2019-11-16 DIAGNOSIS — I447 Left bundle-branch block, unspecified: Secondary | ICD-10-CM

## 2019-11-16 DIAGNOSIS — R600 Localized edema: Secondary | ICD-10-CM | POA: Diagnosis not present

## 2019-11-16 DIAGNOSIS — I351 Nonrheumatic aortic (valve) insufficiency: Secondary | ICD-10-CM

## 2019-11-16 DIAGNOSIS — E786 Lipoprotein deficiency: Secondary | ICD-10-CM

## 2019-11-16 DIAGNOSIS — I2510 Atherosclerotic heart disease of native coronary artery without angina pectoris: Secondary | ICD-10-CM | POA: Diagnosis not present

## 2019-11-16 DIAGNOSIS — D638 Anemia in other chronic diseases classified elsewhere: Secondary | ICD-10-CM | POA: Insufficient documentation

## 2019-11-16 NOTE — Progress Notes (Signed)
Primary Care Provider: Abner Greenspan, MD Cardiologist: Glenetta Hew, MD Electrophysiologist: None  Clinic Note: Chief Complaint  Patient presents with  . Follow-up    Feeling much better.  Was diagnosed with methotrexate related anemia.  . Coronary Artery Disease    No angina  . Shortness of Breath    Exertional dyspnea much improved with treatment of her anemia.    HPI:    [name] [name] is a 79 y.o. female with a PMH notable for CAD-PCI who presents today for delayed annual follow-up.  Initial evaluation for dyspnea in [name] 2018  :   2 D Echo revealed areduced EF of 45 to 50% with septal and inferoseptal hypokinesis and moderate AI.   Stress test was intermediate risk with a EF of 30 to 44% w/ small inferior apical and septal infarct without ischemia.   Cardiac Cath ([name] 2018) for persistent Sx-->significant lesion in the L Circumflex -- treated with a DES stent. Also noted moderate LAD disease that was FFR negative along with a small Diag w/ ostial 70% stenosis (not good PCI target).   [name] [name] was last seen in June 2020 by telehealth visit (with last in person visit in December 2019).  She noted shortness of breath, not bad as it was in January.  No chest pain. ->  Was somewhat upset emotionally husband died unexpectedly on 2018-10-01).  Indicated that she was just trying to "survive he is doing well.  Addressed.  Had not been active since her husband's death.  Activity limited by psoriatic arthritis.  Recent Hospitalizations:  October 25-28 2020: Admitted for fever/sepsis with CAP and acute respiratory failure.  Covid negative  --> [place] interim since last visit, she was noted to be anemic-thought to be related to her methotrexate.  Has been off methotrexate now for 6 months.  Blood counts have improved.  Is receiving erythropoietin injections.  Reviewed  CV studies:    The following studies were reviewed today: (if available, images/films reviewed:  From Epic Chart or Care Everywhere)  CPX [name] 2020: Only exercised for 2 minutes.  Unable to maintain pedal speed.  Heart rate went to 114 bpm.  Minimal blood pressure response.  Peak VO2 estimated at 7.4.  Was noted that she was too debilitated and unable to exercise longer to gain meaningful data.  Suggested deconditioning and potential PT/OT referral.  Interval History:   [name] [name] presents today overall doing much better.  Her dyspnea on exertion has definitely improved.  A lot of it seems to be related to anemia.  She was told that she had bone marrow suppression related to methotrexate and is now getting EPO shots.  Thankfully, her arthritis has not been bothering her too much. Most of the issue with her arthritis is that it is hard for her to get going because of stiffness.  When she is able to get up and walking, things do pretty well.  She is not sure how much stiffer the methotrexate makes, but does feel stiffer over the last 6 months.  However her energy level is better because her edema level is improved.  Despite be anemic, she never really had any anginal symptoms.  She has not had any heart failure symptoms or any signs symptoms of arrhythmias.  CV Review of Symptoms (Summary) Cardiovascular ROS: positive for - Stable exertional dyspnea with only minimal edema. negative for - chest pain, irregular heartbeat, orthopnea, palpitations, paroxysmal nocturnal dyspnea, rapid heart rate, shortness  of breath or Syncope/near syncope, TIA/amaurosis fugax, claudication  The patient does not have symptoms concerning for COVID-19 infection (fever, chills, cough, or new shortness of breath).  The patient is practicing social distancing & Masking.   Moderna COVID Vaccine #2 on 11/02/2019   REVIEWED OF SYSTEMS   Review of Systems  Constitutional: Positive for malaise/fatigue (Energy level is notably improved since treating her anemia.  Still has stiffness and decreased mobility because of  arthritis.). Negative for weight loss.  HENT: Negative for congestion and nosebleeds.   Respiratory: Positive for shortness of breath (Baseline, but notably improved).   Cardiovascular: Negative for claudication.  Gastrointestinal: Negative for blood in stool, heartburn and melena.  Genitourinary: Negative for hematuria.  Musculoskeletal: Positive for joint pain.       Generalized shoulder hip and knee pain as well as hand pain from her psoriatic arthritis.  Has worsened since stopping methotrexate, but is notably not anemic. Relatively immobile  Neurological: Positive for weakness (Her legs are little bit weak, and has little unsteady gait.). Negative for dizziness and headaches.  Psychiatric/Behavioral: Negative for depression and memory loss. The patient is not nervous/anxious and does not have insomnia.    I have reviewed and (if needed) personally updated the patient's problem list, medications, allergies, past medical and surgical history, social and family history.   PAST MEDICAL HISTORY   Past Medical History:  Diagnosis Date  . Anemia    of chronic disease  . Anxiety   . Arthritis    "psoratic arthritis; new dx" (09/11/2016)  . CAD S/P percutaneous coronary angioplasty 08/2016   mLCx 90% -> 0% (2.75 mm x 16 mm) Synergy DES PCI Ostial D1 70% (not PTCA target). Moderate ostial and mD2.  LAD tandem 50% stenoses w/ negative FFR (0.86)  . Cancer ([place])    skin cancer on leg  . Chronic lower back pain   . Complication of anesthesia 1980 and 1982   trouble waking up after breast biopsy, many surgeries since no problem  . Depression   . Dyspnea on effort - CHRONIC    partial relief of Sx post PCI of L Circumflex.  . Fibrocystic breast   . GERD (gastroesophageal reflux disease)   . Headache    "monthly" (09/11/2016)  . History of blood transfusion    "several since age 44; I've had them after most of my surgeries" (09/11/2016)  . History of myocardial infarction    "doctor said  there are signs I've had a heart attack; don't know when"  - Myoview [name] 2018 indicated small Inferior - Inferoseptal Infarct (vs. rest ischemia). Echo with inferoseptal hypokinesis.  Marland Kitchen Hx of skin cancer, basal cell 2013 and 2012   right inner, lower leg; several scattered around my body  . Hypertension   . Iron deficiency anemia    "I've had an iron infusion"  . Osteoarthritis   . Osteopenia   . Peptic ulcer disease    H pylori  . PMR (polymyalgia rheumatica) (HCC)   . PONV (postoperative nausea and vomiting)   . Psoriasis     PAST SURGICAL HISTORY   Past Surgical History:  Procedure Laterality Date  . APPENDECTOMY    . BACK SURGERY     x 6   . BREAST BIOPSY Left 1980 and 1982   "benign"  . CARDIAC CATHETERIZATION N/A 09/11/2016   Procedure: Left Heart Cath and Coronary Angiography;  Surgeon: Leonie Man, MD;  Location: [facility];  Service: Cardiovascular: mLCx  90% * (PCI). Ostial D1 70% (not PTCA target). Mod ostial and mD2.  LAD tandem 50% stenoses w/ negative FFR (0.86)  . CARDIAC CATHETERIZATION N/A 09/11/2016   Procedure: Intravascular Pressure Wire/FFR Study;  Surgeon: Leonie Man, MD;  Location: [facility];  Service: Cardiovascular: --  LAD tandem 50% stenoses w/ negative FFR (0.86  . CARDIAC CATHETERIZATION N/A 09/11/2016   Procedure: Coronary Stent Intervention;  Surgeon: Leonie Man, MD;  Location: [facility];  Service: Cardiovascular: -- DES PCI mLCx 90%--0% SYNERGY DES 2.75 MM X 16 MM.  Marland Kitchen CARDIAC CATHETERIZATION  2001   non-obstructive CAD  . CATARACT EXTRACTION W/ INTRAOCULAR LENS  IMPLANT, BILATERAL  July -  August 2017  . DILATION AND CURETTAGE OF UTERUS  X 3  . ESOPHAGOGASTRODUODENOSCOPY  03/2009   ulcer,HP, GERD stricture  . EXCISIONAL HEMORRHOIDECTOMY    . HIP ARTHROPLASTY Left 2010  . JOINT REPLACEMENT    . KNEE ARTHROSCOPY Bilateral   . LAPAROSCOPIC CHOLECYSTECTOMY  2005  . LUMBAR DISC SURGERY  11/1984; 1987; 1989; 2004;  2005 X 2   deg. disk lumber spine   . MOHS SURGERY Right    "inside my lower leg"  . NASAL SINUS SURGERY  1970s  . NM MYOVIEW LTD  08/28/2016   INTERMEDIATE RISK (b/c reduced EF & ? small infarct) -- EF ~35-40%.  ? Prior Small Inferior-apical & septal infarct (w/o ischemia).  Difuse HK - worse in inferoapex.  Suggest prior infarct with no ischemia. Ischemic area correlates with echo WMA  . REVERSE SHOULDER ARTHROPLASTY Left 12/17/2017   Procedure: LEFT REVERSE SHOULDER ARTHROPLASTY;  Surgeon: Netta Cedars, MD;  Location: [facility];  Service: Orthopedics;  Laterality: Left;  . TOTAL HIP ARTHROPLASTY Right 04/04/2013   Procedure: RIGHT TOTAL HIP ARTHROPLASTY ANTERIOR APPROACH;  Surgeon: Mauri Pole, MD;  Location: [facility];  Service: Orthopedics;  Laterality: Right;  . TOTAL KNEE ARTHROPLASTY Right 2009  . TRANSTHORACIC ECHOCARDIOGRAM  08/2016   mild conc LVH. EF 45-50% - anteroseptal & inferoseptal HK. Mod AI.  Marland Kitchen TRANSTHORACIC ECHOCARDIOGRAM  08/2017   (post PCI) --> EF normalized:  Normal LV systolic function; mild diastolic dysfunction; mild   LVH; sclerotic aortic valve with mild AI.  No RWMA  . TUBAL LIGATION    . VAGINAL HYSTERECTOMY  1970s   partial     09/11/2016: Post-Intervention Diagram:Mid Cx lesion, 90 %stenosed -SYNERGY DES 2.75X16;LAD FFR Negative    MEDICATIONS/ALLERGIES   Current Meds  Medication Sig  . acetaminophen (TYLENOL) 500 MG tablet Take 1,000 mg by mouth 2 (two) times daily as needed for moderate pain or headache.   Marland Kitchen atorvastatin (LIPITOR) 20 MG tablet TAKE 1 TABLET BY MOUTH EVERY DAY  . busPIRone (BUSPAR) 15 MG tablet Take 1 tablet (15 mg total) by mouth daily.  . cetirizine (ZYRTEC) 10 MG tablet Take 1 tablet (10 mg total) by mouth daily.  . clopidogrel (PLAVIX) 75 MG tablet Take 1 tablet (75 mg total) by mouth daily with breakfast.  . esomeprazole (NEXIUM) 40 MG capsule esomeprazole magnesium 40 mg capsule,delayed release  . fluticasone (FLONASE) 50  MCG/ACT nasal spray Place 2 sprays into both nostrils daily.  Marland Kitchen HYDROcodone-acetaminophen (NORCO/VICODIN) 5-325 MG tablet Take 1-2 tablets by mouth every 4 (four) hours as needed for moderate pain or severe pain.  Marland Kitchen losartan-hydrochlorothiazide (HYZAAR) 50-12.5 MG tablet TAKE 1 TABLET BY MOUTH EVERY DAY  . Melatonin 3 MG CAPS Take 9 mg by mouth at bedtime.   Marland Kitchen  metoprolol tartrate (LOPRESSOR) 25 MG tablet TAKE 1 TABLET BY MOUTH TWICE A DAY  . pantoprazole (PROTONIX) 40 MG tablet Take 1 tablet (40 mg total) by mouth daily.  . Vitamin D, Cholecalciferol, 25 MCG (1000 UT) CAPS Take 4 capsules by mouth daily.     Allergies  Allergen Reactions  . 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 Ace Inhibitors     cough  . Oxaprozin     GI upset   . Pregabalin Swelling    SWELLING REACTION UNSPECIFIED   . Amoxicillin-Pot Clavulanate Nausea And Vomiting    gi upset Has patient had a PCN reaction causing immediate rash, facial/tongue/throat swelling, SOB or lightheadedness with hypotension: No Has patient had a PCN reaction causing severe rash involving mucus membranes or skin necrosis: No Has patient had a PCN reaction that required hospitalization No Has patient had a PCN reaction occurring within the last 10 years: No If all of the above answers are "NO", then may proceed with Cephalosporin use.     SOCIAL HISTORY/FAMILY HISTORY   Reviewed in Epic:  Pertinent findings: No changes.  She is eagerly awaiting the lessening of COVID-19 restrictions so that she can go back to seeing her 14 great grandkids.  None that met  OBJCTIVE -PE, EKG, labs   Wt Readings from Last 3 Encounters:  11/16/19 175 lb 3.2 oz (79.5 kg)  11/01/19 172 lb 9 oz (78.3 kg)  10/27/19 171 lb (77.6 kg)    Physical Exam: BP 120/64   Pulse 64   Ht 5' 3.25" (1.607 m)   Wt 175 lb 3.2 oz (79.5 kg)   SpO2 99%   BMI 30.79 kg/m  Physical Exam  Constitutional: She is oriented to  person, place, and time. She appears well-developed and well-nourished. No distress.  Healthy-appearing.  Well-groomed.  HENT:  Head: Normocephalic and atraumatic.  Neck: No JVD present.  Cardiovascular: Normal rate, regular rhythm, S1 normal, S2 normal and intact distal pulses. PMI is not displaced. Exam reveals distant heart sounds. Exam reveals no gallop and no friction rub.  Murmur (1/6 C-D SEM _0 ) heard. Pulmonary/Chest: Effort normal. No respiratory distress. She has no wheezes. She has no rales.  Mild interstitial breath sounds.  Abdominal: Soft. Bowel sounds are normal. She exhibits no distension. There is no abdominal tenderness. There is no rebound.  No HSM  Musculoskeletal:        General: No edema. Normal range of motion.     Cervical back: Normal range of motion and neck supple.  Neurological: She is alert and oriented to person, place, and time.  Skin: Skin is warm and dry.  Psychiatric: She has a normal mood and affect. Her behavior is normal. Judgment and thought content normal.  Vitals reviewed.    Adult ECG Report Not checked  Recent Labs:   Lab Results  Component Value Date   CHOL 136 10/27/2019   HDL 44.70 10/27/2019   LDLCALC 55 10/27/2019   LDLDIRECT 35.0 10/19/2018   TRIG 182.0 (H) 10/27/2019   CHOLHDL 3 10/27/2019   Lab Results  Component Value Date   CREATININE 1.21 (H) 10/27/2019   BUN 25 (H) 10/27/2019   NA 137 10/27/2019   K 4.5 10/27/2019   CL 108 10/27/2019   CO2 23 10/27/2019   Lab Results  Component Value Date   TSH 2.90 10/27/2019   CBC Latest Ref Rng & Units 10/27/2019 10/19/2019 10/04/2019  WBC 4.0 - 10.5 K/uL  4.8 5.8 6.0  Hemoglobin 12.0 - 15.0 g/dL 10.6(L) 10.0(L) 10.7(L)  Hematocrit 36.0 - 46.0 % 33.3(L) 33.3(L) 35.2(L)  Platelets 150.0 - 400.0 K/uL 341.0 222 270    ASSESSMENT/PLAN    Problem List Items Addressed This Visit    CAD S/P percutaneous coronary angioplasty - Primary (Chronic)    She did have some relief after  we stented her severely diseased circumflex, she has moderate disease in the LAD being treated medically.  I suspect that her exertional dyspnea over the last year or so was really related to anemia and not due to CAD.  Plan:  Continue low-dose beta-blocker along with ARB-HCTZ. ->  Not able to titrate beta-blocker further because of bradycardia.  On statin stable dose.  Doing well off of Imdur but no angina.  Now on clopidogrel without aspirin. -->   Okay to hold Plavix 5 to 7 days preop for surgeries or procedures.      Hyperlipidemia with low HDL (Chronic)    Recently check lipids show LDL down to 55.  Outstanding response to atorvastatin.  Continue current dose.  No need to titrate further.  Does not really see me having myalgias as much as arthralgias.  Would not titrate further however to avoid worsening aching.      Essential hypertension (Chronic)    Stable blood pressure reading on current meds.  Is only able to tolerate current dose of beta-blocker because of bradycardia.  Is on moderate dose losartan-HCTZ.  On guideline directed therapy with beta-blocker replacing ARB because of CAD.      Left bundle branch block (LBBB) on electrocardiogram (Chronic)    Asymptomatic.  Stable.  No change in EF on echo.  Continue to monitor.      Aortic regurgitation (Chronic)    Minimal aortic sclerosis and insufficiency on echo and minimal murmur on exam. Would only recheck echo if murmur worsens or symptoms warrant.      Pedal edema    Is on HCTZ as part of her antihypertensive agent.  I do agree with maintaining adequate hydration to avoid orthostasis.  Has been reluctant to use support stockings stating that they are uncomfortable.  We talked about zipper support stockings.  She will consider.  Elevate feet when possible. Continue to follow daily weights.  Seems euvolemic for now.          COVID-19 Education: The signs and symptoms of COVID-19 were discussed with the  patient and how to seek care for testing (follow up with PCP or arrange E-visit).   The importance of social distancing was discussed today.  I spent a total of 33mnutes with the patient. >  50% of the time was spent in direct patient consultation.  Additional time spent with chart review  / charting (studies, outside notes, etc): 8 Total Time: 28 min  Current medicines are reviewed at length with the patient today.  (+/- concerns) n/a  Notice: This dictation was prepared with Dragon dictation along with smaller phrase technology. Any transcriptional errors that result from this process are unintentional and may not be corrected upon review.  Patient Instructions / Medication Changes & Studies & Tests Ordered   Patient Instructions  Medication Instructions:  No changes *If you need a refill on your cardiac medications before your next appointment, please call your pharmacy*   Lab Work: Not needed   Testing/Procedures: Not needed   Follow-Up: [facility] and your health needs are our priority.  As part of our  continuing mission to provide you with exceptional heart care, we have created designated Provider Care Teams.  These Care Teams include your primary Cardiologist (physician) and Advanced Practice Providers (APPs -  Physician Assistants and Nurse Practitioners) who all work together to provide you with the care you need, when you need it.  We recommend signing up for the patient portal called "MyChart".  Sign up information is provided on this After Visit Summary.  MyChart is used to connect with patients for Virtual Visits (Telemedicine).  Patients are able to view lab/test results, encounter notes, upcoming appointments, etc.  Non-urgent messages can be sent to your provider as well.   To learn more about what you can do with MyChart, go to [email or web address].    Your next appointment:   12 month(s) March 2022  The format for your next appointment:   In  Person  Provider:   Glenetta Hew, MD   Other Instructions     Studies Ordered:   No orders of the defined types were placed in this encounter.    Glenetta Hew, M.D., M.S. Interventional Cardiologist   Pager # [phone] Phone # [phone] [address] [place], [place] [address]   Thank you for choosing Heartcare at [facility]!!

## 2019-11-16 NOTE — Patient Instructions (Addendum)
Medication Instructions:  No changes *If you need a refill on your cardiac medications before your next appointment, please call your pharmacy*   Lab Work: Not needed   Testing/Procedures: Not needed   Follow-Up: At [facility], you and your health needs are our priority.  As part of our continuing mission to provide you with exceptional heart care, we have created designated Provider Care Teams.  These Care Teams include your primary Cardiologist (physician) and Advanced Practice Providers (APPs -  Physician Assistants and Nurse Practitioners) who all work together to provide you with the care you need, when you need it.  We recommend signing up for the patient portal called "MyChart".  Sign up information is provided on this After Visit Summary.  MyChart is used to connect with patients for Virtual Visits (Telemedicine).  Patients are able to view lab/test results, encounter notes, upcoming appointments, etc.  Non-urgent messages can be sent to your provider as well.   To learn more about what you can do with MyChart, go to [email or web address].    Your next appointment:   12 month(s) March 2022  The format for your next appointment:   In Person  Provider:   Glenetta Hew, MD   Other Instructions

## 2019-11-19 ENCOUNTER — Encounter: Payer: Self-pay | Admitting: Cardiology

## 2019-11-19 NOTE — Assessment & Plan Note (Signed)
She did have some relief after we stented her severely diseased circumflex, she has moderate disease in the LAD being treated medically.  I suspect that her exertional dyspnea over the last year or so was really related to anemia and not due to CAD.  Plan:  Continue low-dose beta-blocker along with ARB-HCTZ. ->  Not able to titrate beta-blocker further because of bradycardia.  On statin stable dose.  Doing well off of Imdur but no angina.  Now on clopidogrel without aspirin. -->   Okay to hold Plavix 5 to 7 days preop for surgeries or procedures.

## 2019-11-19 NOTE — Assessment & Plan Note (Signed)
Recently check lipids show LDL down to 55.  Outstanding response to atorvastatin.  Continue current dose.  No need to titrate further.  Does not really see me having myalgias as much as arthralgias.  Would not titrate further however to avoid worsening aching.

## 2019-11-19 NOTE — Assessment & Plan Note (Signed)
Minimal aortic sclerosis and insufficiency on echo and minimal murmur on exam. Would only recheck echo if murmur worsens or symptoms warrant.

## 2019-11-19 NOTE — Assessment & Plan Note (Signed)
Is on HCTZ as part of her antihypertensive agent.  I do agree with maintaining adequate hydration to avoid orthostasis.  Has been reluctant to use support stockings stating that they are uncomfortable.  We talked about zipper support stockings.  She will consider.  Elevate feet when possible. Continue to follow daily weights.  Seems euvolemic for now.

## 2019-11-19 NOTE — Assessment & Plan Note (Signed)
Asymptomatic.  Stable.  No change in EF on echo.  Continue to monitor.

## 2019-11-19 NOTE — Assessment & Plan Note (Signed)
Stable blood pressure reading on current meds.  Is only able to tolerate current dose of beta-blocker because of bradycardia.  Is on moderate dose losartan-HCTZ.  On guideline directed therapy with beta-blocker replacing ARB because of CAD.

## 2019-11-20 ENCOUNTER — Inpatient Hospital Stay: Payer: Medicare Other

## 2019-11-20 ENCOUNTER — Other Ambulatory Visit: Payer: Self-pay

## 2019-11-20 VITALS — BP 122/68

## 2019-11-20 DIAGNOSIS — D638 Anemia in other chronic diseases classified elsewhere: Secondary | ICD-10-CM

## 2019-11-20 DIAGNOSIS — M353 Polymyalgia rheumatica: Secondary | ICD-10-CM | POA: Diagnosis present

## 2019-11-20 LAB — CMP (CANCER CENTER ONLY)
ALT: 15 U/L (ref 0–44)
AST: 24 U/L (ref 15–41)
Albumin: 3.5 g/dL (ref 3.5–5.0)
Alkaline Phosphatase: 72 U/L (ref 38–126)
Anion gap: 9 (ref 5–15)
BUN: 29 mg/dL — ABNORMAL HIGH (ref 8–23)
CO2: 24 mmol/L (ref 22–32)
Calcium: 8.9 mg/dL (ref 8.9–10.3)
Chloride: 107 mmol/L (ref 98–111)
Creatinine: 1.27 mg/dL — ABNORMAL HIGH (ref 0.44–1.00)
GFR, Est AFR Am: 46 mL/min — ABNORMAL LOW (ref >60)
GFR, Estimated: 40 mL/min — ABNORMAL LOW (ref >60)
Glucose, Bld: 81 mg/dL (ref 70–99)
Potassium: 4.6 mmol/L (ref 3.5–5.1)
Sodium: 140 mmol/L (ref 135–145)
Total Bilirubin: 0.4 mg/dL (ref 0.3–1.2)
Total Protein: 7.2 g/dL (ref 6.5–8.1)

## 2019-11-20 LAB — CBC WITH DIFFERENTIAL (CANCER CENTER ONLY)
Abs Immature Granulocytes: 0.03 10*3/uL (ref 0.00–0.07)
Basophils Absolute: 0.1 10*3/uL (ref 0.0–0.1)
Basophils Relative: 1 %
Eosinophils Absolute: 0.2 10*3/uL (ref 0.0–0.5)
Eosinophils Relative: 2 %
HCT: 34.3 % — ABNORMAL LOW (ref 36.0–46.0)
Hemoglobin: 10.4 g/dL — ABNORMAL LOW (ref 12.0–15.0)
Immature Granulocytes: 0 %
Lymphocytes Relative: 36 %
Lymphs Abs: 2.5 10*3/uL (ref 0.7–4.0)
MCH: 28.7 pg (ref 26.0–34.0)
MCHC: 30.3 g/dL (ref 30.0–36.0)
MCV: 94.5 fL (ref 80.0–100.0)
Monocytes Absolute: 0.6 10*3/uL (ref 0.1–1.0)
Monocytes Relative: 8 %
Neutro Abs: 3.6 10*3/uL (ref 1.7–7.7)
Neutrophils Relative %: 53 %
Platelet Count: 317 10*3/uL (ref 150–400)
RBC: 3.63 MIL/uL — ABNORMAL LOW (ref 3.87–5.11)
RDW: 13.6 % (ref 11.5–15.5)
WBC Count: 6.9 10*3/uL (ref 4.0–10.5)
nRBC: 0 % (ref 0.0–0.2)

## 2019-11-20 MED ORDER — EPOETIN ALFA-EPBX 40000 UNIT/ML IJ SOLN
40000.0000 [IU] | Freq: Once | INTRAMUSCULAR | Status: AC
  Administered 2019-11-20: 40000 [IU] via SUBCUTANEOUS

## 2019-11-20 MED ORDER — EPOETIN ALFA-EPBX 40000 UNIT/ML IJ SOLN
INTRAMUSCULAR | Status: AC
  Filled 2019-11-20: qty 1

## 2019-11-20 NOTE — Patient Instructions (Signed)

## 2019-11-21 ENCOUNTER — Telehealth: Payer: Self-pay

## 2019-11-21 NOTE — Telephone Encounter (Signed)
Opened in error

## 2019-11-21 NOTE — Telephone Encounter (Signed)
   [facility] Pre-operative Risk Assessment    Request for surgical clearance:  1. What type of surgery is being performed? Right carpel tunnel release  2. When is this surgery scheduled? 12/05/19  3. What type of clearance is required (medical clearance vs. Pharmacy clearance to hold med vs. Both)? Both  4. Are there any medications that need to be held prior to surgery and how long? Plavix  5. Practice name and name of physician performing surgery? [facility]  Dr.Gramig  6. What is your office phone number [phone]   7.   What is your office fax number [phone]  8.   Anesthesia type  Local   Kathyrn Lass 11/21/2019, 4:50 PM  _________________________________________________________________   (provider comments below)

## 2019-11-22 ENCOUNTER — Other Ambulatory Visit: Payer: Self-pay | Admitting: Cardiology

## 2019-11-22 NOTE — Telephone Encounter (Signed)
   Primary Cardiologist: Glenetta Hew, MD  Chart reviewed as part of pre-operative protocol coverage. Given past medical history and time since last visit, based on ACC/AHA guidelines, [name] [name] would be at acceptable risk for the planned procedure without further cardiovascular testing.   She was last seen by Dr. Ellyn Hack on 11/16/19 and was stable.  She may hold Plavix for 5-7 days prior to the procedure.   I will route this recommendation to the requesting party via Epic fax function and remove from pre-op pool.  Please call with questions.  Phill Myron. West Pugh, ANP, AACC  11/22/2019, 8:48 AM

## 2019-11-25 ENCOUNTER — Other Ambulatory Visit: Payer: Self-pay | Admitting: Cardiology

## 2019-11-30 ENCOUNTER — Other Ambulatory Visit: Payer: Medicare Other

## 2019-11-30 ENCOUNTER — Ambulatory Visit: Payer: Medicare Other | Admitting: Hematology and Oncology

## 2019-11-30 ENCOUNTER — Ambulatory Visit: Payer: Medicare Other

## 2019-12-13 ENCOUNTER — Other Ambulatory Visit: Payer: Self-pay | Admitting: Family Medicine

## 2019-12-13 NOTE — Progress Notes (Signed)
Patient Care Team: Tower, [name], MD as PCP - General Ellyn Hack Leonie Green, MD as PCP - Cardiology (Cardiology) Suella Broad, MD as Consulting Physician (Physical Medicine and Rehabilitation) Gavin Pound, MD as Consulting Physician (Rheumatology) Haverstock, Jennefer Bravo, MD as Referring Physician (Dermatology) Nicholas Lose, MD as Consulting Physician (Hematology and Oncology)  DIAGNOSIS:    ICD-10-CM   1. Anemia of chronic disease  D63.8     CHIEF COMPLIANT: Follow-up of anemia  INTERVAL HISTORY: [name] [name] is a 79 y.o. with above-mentioned history of anemia whocurrentlyreceives Retacrit.Labs on 11/20/19 showed Hg 10.4, HCT 34.3. She presents to the clinic todayfor follow-up and Retacrit.  She had recent carpal tunnel surgery and still has sutures.  Overall she has been feeling fairly well.  Energy levels are adequate.  No problems tolerating Retacrit.  ALLERGIES:  is allergic to sulfonamide derivatives; ace inhibitors; oxaprozin; pregabalin; and amoxicillin-pot clavulanate.  MEDICATIONS:  Current Outpatient Medications  Medication Sig Dispense Refill  . acetaminophen (TYLENOL) 500 MG tablet Take 1,000 mg by mouth 2 (two) times daily as needed for moderate pain or headache.     Marland Kitchen atorvastatin (LIPITOR) 20 MG tablet TAKE 1 TABLET BY MOUTH EVERY DAY 90 tablet 3  . busPIRone (BUSPAR) 15 MG tablet Take 1 tablet (15 mg total) by mouth daily. 90 tablet 3  . cetirizine (ZYRTEC) 10 MG tablet Take 1 tablet (10 mg total) by mouth daily. 30 tablet 11  . clopidogrel (PLAVIX) 75 MG tablet TAKE 1 TABLET BY MOUTH EVERY DAY WITH BREAKFAST 90 tablet 3  . esomeprazole (NEXIUM) 40 MG capsule esomeprazole magnesium 40 mg capsule,delayed release    . fluticasone (FLONASE) 50 MCG/ACT nasal spray Place 2 sprays into both nostrils daily. 16 g 6  . HYDROcodone-acetaminophen (NORCO/VICODIN) 5-325 MG tablet Take 1-2 tablets by mouth every 4 (four) hours as needed for moderate pain or severe pain. 30  tablet 0  . losartan-hydrochlorothiazide (HYZAAR) 50-12.5 MG tablet TAKE 1 TABLET BY MOUTH EVERY DAY 90 tablet 1  . Melatonin 3 MG CAPS Take 9 mg by mouth at bedtime.     . metoprolol tartrate (LOPRESSOR) 25 MG tablet TAKE 1 TABLET BY MOUTH TWICE A DAY 180 tablet 1  . nitroGLYCERIN (NITROSTAT) 0.4 MG SL tablet Place 1 tablet (0.4 mg total) under the tongue every 5 (five) minutes as needed for chest pain. 25 tablet 5  . pantoprazole (PROTONIX) 40 MG tablet Take 1 tablet (40 mg total) by mouth daily. 90 tablet 1  . Vitamin D, Cholecalciferol, 25 MCG (1000 UT) CAPS Take 4 capsules by mouth daily.      No current facility-administered medications for this visit.    PHYSICAL EXAMINATION: ECOG PERFORMANCE STATUS: 1 - Symptomatic but completely ambulatory  Vitals:   12/14/19 1348  BP: (!) 152/64  Pulse: 68  Resp: 18  Temp: 98.9 F (37.2 C)  SpO2: 100%   Filed Weights   12/14/19 1348  Weight: 176 lb (79.8 kg)    LABORATORY DATA:  I have reviewed the data as listed CMP Latest Ref Rng & Units 11/20/2019 10/27/2019 10/19/2019  Glucose 70 - 99 mg/dL 81 83 88  BUN 8 - 23 mg/dL 29(H) 25(H) 28(H)  Creatinine 0.44 - 1.00 mg/dL 1.27(H) 1.21(H) 1.06(H)  Sodium 135 - 145 mmol/L 140 137 139  Potassium 3.5 - 5.1 mmol/L 4.6 4.5 4.6  Chloride 98 - 111 mmol/L 107 108 107  CO2 22 - 32 mmol/L 24 23 26   Calcium 8.9 - 10.3 mg/dL  8.9 9.0 8.1(L)  Total Protein 6.5 - 8.1 g/dL 7.2 6.9 6.6  Total Bilirubin 0.3 - 1.2 mg/dL 0.4 0.4 0.3  Alkaline Phos 38 - 126 U/L 72 60 65  AST 15 - 41 U/L 24 20 18   ALT 0 - 44 U/L 15 11 10     Lab Results  Component Value Date   WBC 5.1 12/14/2019   HGB 10.2 (L) 12/14/2019   HCT 33.6 (L) 12/14/2019   MCV 93.6 12/14/2019   PLT 249 12/14/2019   NEUTROABS 2.5 12/14/2019    ASSESSMENT & PLAN:  Anemia of chronic disease 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 ( anemia of chronic inflammation), patient has chronic polymyalgia rheumatica I reviewed  her blood counts with her. 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 Patient had carpal tunnel surgery on the left hand and expecting to have another carpal tunnel surgery in the right hand soon. Return to clinic monthly for injections and every 3 months with labs for follow-up with me.   No orders of the defined types were placed in this encounter.  The patient has a good understanding of the overall plan. she agrees with it. she will call with any problems that may develop before the next visit here.  Total time spent: 20 mins including face to face time and time spent for planning, charting and coordination of care  Nicholas Lose, MD 12/14/2019  I, Cloyde Reams Dorshimer, am acting as scribe for Dr. Nicholas Lose.  I have reviewed the above documentation for accuracy and completeness, and I agree with the above.

## 2019-12-14 ENCOUNTER — Inpatient Hospital Stay: Payer: Medicare Other

## 2019-12-14 ENCOUNTER — Inpatient Hospital Stay: Payer: Medicare Other | Admitting: Hematology and Oncology

## 2019-12-14 ENCOUNTER — Other Ambulatory Visit: Payer: Self-pay

## 2019-12-14 ENCOUNTER — Inpatient Hospital Stay: Payer: Medicare Other | Attending: Hematology and Oncology

## 2019-12-14 DIAGNOSIS — Z79899 Other long term (current) drug therapy: Secondary | ICD-10-CM | POA: Insufficient documentation

## 2019-12-14 DIAGNOSIS — D638 Anemia in other chronic diseases classified elsewhere: Secondary | ICD-10-CM

## 2019-12-14 DIAGNOSIS — M353 Polymyalgia rheumatica: Secondary | ICD-10-CM | POA: Insufficient documentation

## 2019-12-14 LAB — CBC WITH DIFFERENTIAL (CANCER CENTER ONLY)
Abs Immature Granulocytes: 0.01 10*3/uL (ref 0.00–0.07)
Basophils Absolute: 0 10*3/uL (ref 0.0–0.1)
Basophils Relative: 1 %
Eosinophils Absolute: 0.1 10*3/uL (ref 0.0–0.5)
Eosinophils Relative: 2 %
HCT: 33.6 % — ABNORMAL LOW (ref 36.0–46.0)
Hemoglobin: 10.2 g/dL — ABNORMAL LOW (ref 12.0–15.0)
Immature Granulocytes: 0 %
Lymphocytes Relative: 38 %
Lymphs Abs: 1.9 10*3/uL (ref 0.7–4.0)
MCH: 28.4 pg (ref 26.0–34.0)
MCHC: 30.4 g/dL (ref 30.0–36.0)
MCV: 93.6 fL (ref 80.0–100.0)
Monocytes Absolute: 0.5 10*3/uL (ref 0.1–1.0)
Monocytes Relative: 10 %
Neutro Abs: 2.5 10*3/uL (ref 1.7–7.7)
Neutrophils Relative %: 49 %
Platelet Count: 249 10*3/uL (ref 150–400)
RBC: 3.59 MIL/uL — ABNORMAL LOW (ref 3.87–5.11)
RDW: 13.6 % (ref 11.5–15.5)
WBC Count: 5.1 10*3/uL (ref 4.0–10.5)
nRBC: 0 % (ref 0.0–0.2)

## 2019-12-14 LAB — CMP (CANCER CENTER ONLY)
ALT: 44 U/L (ref 0–44)
AST: 53 U/L — ABNORMAL HIGH (ref 15–41)
Albumin: 3.6 g/dL (ref 3.5–5.0)
Alkaline Phosphatase: 94 U/L (ref 38–126)
Anion gap: 7 (ref 5–15)
BUN: 33 mg/dL — ABNORMAL HIGH (ref 8–23)
CO2: 20 mmol/L — ABNORMAL LOW (ref 22–32)
Calcium: 8.7 mg/dL — ABNORMAL LOW (ref 8.9–10.3)
Chloride: 112 mmol/L — ABNORMAL HIGH (ref 98–111)
Creatinine: 1.41 mg/dL — ABNORMAL HIGH (ref 0.44–1.00)
GFR, Est AFR Am: 41 mL/min — ABNORMAL LOW (ref >60)
GFR, Estimated: 35 mL/min — ABNORMAL LOW (ref >60)
Glucose, Bld: 91 mg/dL (ref 70–99)
Potassium: 4.4 mmol/L (ref 3.5–5.1)
Sodium: 139 mmol/L (ref 135–145)
Total Bilirubin: 0.3 mg/dL (ref 0.3–1.2)
Total Protein: 7.2 g/dL (ref 6.5–8.1)

## 2019-12-14 MED ORDER — EPOETIN ALFA-EPBX 40000 UNIT/ML IJ SOLN
INTRAMUSCULAR | Status: AC
  Filled 2019-12-14: qty 1

## 2019-12-14 MED ORDER — EPOETIN ALFA-EPBX 40000 UNIT/ML IJ SOLN
40000.0000 [IU] | Freq: Once | INTRAMUSCULAR | Status: AC
  Administered 2019-12-14: 40000 [IU] via SUBCUTANEOUS

## 2019-12-14 NOTE — Assessment & Plan Note (Signed)
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 ( anemia of chronic inflammation), patient has chronic polymyalgia rheumatica I reviewed her blood counts with her. 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  Lab review 10/12/2017: Hemoglobin 10.2, MCV 94, normal WBC and platelet counts, creatinine 1.02 04/13/2019:Hemoglobin 9 05/19/2019:Hemoglobin 9.1 07/13/2019: Hemoglobin 10.3 08/24/2019:Hemoglobin 10.2 12/14/2019:  Current treatment:Retacrit 40,000 unitscurrentlyreceiving itevery 3-weeks.

## 2019-12-14 NOTE — Patient Instructions (Signed)

## 2019-12-21 ENCOUNTER — Other Ambulatory Visit: Payer: Medicare Other

## 2019-12-21 ENCOUNTER — Ambulatory Visit: Payer: Medicare Other

## 2019-12-25 ENCOUNTER — Other Ambulatory Visit: Payer: Self-pay | Admitting: Cardiology

## 2019-12-26 NOTE — Telephone Encounter (Signed)
*  STAT* If patient is at the pharmacy, call can be transferred to refill team.   1. Which medications need to be refilled? (please list name of each medication and dose if known)  Losartan- need this today please  2. Which pharmacy/location (including street and city if local pharmacy) is medication to be sent to? [facility] [address], [place]  3. Do they need a 30 day or 90 day supply?90 days and refills

## 2019-12-27 ENCOUNTER — Other Ambulatory Visit: Payer: Self-pay | Admitting: Cardiology

## 2019-12-27 MED ORDER — LOSARTAN POTASSIUM-HCTZ 50-12.5 MG PO TABS: 1.0000 | ORAL_TABLET | Freq: Every day | ORAL | 1 refills | Status: DC

## 2019-12-27 NOTE — Telephone Encounter (Signed)
*  STAT* If patient is at the pharmacy, call can be transferred to refill team.   1. Which medications need to be refilled? (please list name of each medication and dose if known)   losartan-hydrochlorothiazide (HYZAAR) 50-12.5 MG tablet    2. Which pharmacy/location (including street and city if local pharmacy) is medication to be sent to? [id number] [place],  - [address]  3. Do they need a 30 day or 90 day supply? 90 with refills   Pt is out of medication

## 2020-01-03 ENCOUNTER — Encounter (HOSPITAL_COMMUNITY): Payer: Self-pay

## 2020-01-03 ENCOUNTER — Emergency Department (HOSPITAL_COMMUNITY): Payer: Medicare Other

## 2020-01-03 ENCOUNTER — Inpatient Hospital Stay (HOSPITAL_COMMUNITY)
Admission: EM | Admit: 2020-01-03 | Discharge: 2020-01-08 | DRG: 481 | Disposition: A | Discharge status: Skilled Nursing Facility | Payer: Medicare Other | Attending: Internal Medicine | Admitting: Internal Medicine

## 2020-01-03 ENCOUNTER — Other Ambulatory Visit: Payer: Self-pay

## 2020-01-03 DIAGNOSIS — Z9861 Coronary angioplasty status: Secondary | ICD-10-CM

## 2020-01-03 DIAGNOSIS — Z7902 Long term (current) use of antithrombotics/antiplatelets: Secondary | ICD-10-CM

## 2020-01-03 DIAGNOSIS — Z9049 Acquired absence of other specified parts of digestive tract: Secondary | ICD-10-CM

## 2020-01-03 DIAGNOSIS — Z20822 Contact with and (suspected) exposure to covid-19: Secondary | ICD-10-CM | POA: Diagnosis present

## 2020-01-03 DIAGNOSIS — S7291XA Unspecified fracture of right femur, initial encounter for closed fracture: Secondary | ICD-10-CM | POA: Diagnosis not present

## 2020-01-03 DIAGNOSIS — E785 Hyperlipidemia, unspecified: Secondary | ICD-10-CM | POA: Diagnosis present

## 2020-01-03 DIAGNOSIS — I2510 Atherosclerotic heart disease of native coronary artery without angina pectoris: Secondary | ICD-10-CM | POA: Diagnosis present

## 2020-01-03 DIAGNOSIS — I10 Essential (primary) hypertension: Secondary | ICD-10-CM | POA: Diagnosis present

## 2020-01-03 DIAGNOSIS — W010XXA Fall on same level from slipping, tripping and stumbling without subsequent striking against object, initial encounter: Secondary | ICD-10-CM | POA: Diagnosis present

## 2020-01-03 DIAGNOSIS — Z419 Encounter for procedure for purposes other than remedying health state, unspecified: Secondary | ICD-10-CM

## 2020-01-03 DIAGNOSIS — F329 Major depressive disorder, single episode, unspecified: Secondary | ICD-10-CM | POA: Diagnosis present

## 2020-01-03 DIAGNOSIS — Z79899 Other long term (current) drug therapy: Secondary | ICD-10-CM

## 2020-01-03 DIAGNOSIS — D62 Acute posthemorrhagic anemia: Secondary | ICD-10-CM | POA: Diagnosis not present

## 2020-01-03 DIAGNOSIS — S72401A Unspecified fracture of lower end of right femur, initial encounter for closed fracture: Secondary | ICD-10-CM | POA: Diagnosis not present

## 2020-01-03 DIAGNOSIS — I447 Left bundle-branch block, unspecified: Secondary | ICD-10-CM | POA: Diagnosis present

## 2020-01-03 DIAGNOSIS — Y92008 Other place in unspecified non-institutional (private) residence as the place of occurrence of the external cause: Secondary | ICD-10-CM

## 2020-01-03 DIAGNOSIS — Z85828 Personal history of other malignant neoplasm of skin: Secondary | ICD-10-CM | POA: Diagnosis not present

## 2020-01-03 DIAGNOSIS — M25572 Pain in left ankle and joints of left foot: Secondary | ICD-10-CM

## 2020-01-03 DIAGNOSIS — K219 Gastro-esophageal reflux disease without esophagitis: Secondary | ICD-10-CM | POA: Diagnosis present

## 2020-01-03 DIAGNOSIS — D638 Anemia in other chronic diseases classified elsewhere: Secondary | ICD-10-CM | POA: Diagnosis present

## 2020-01-03 DIAGNOSIS — Y9389 Activity, other specified: Secondary | ICD-10-CM | POA: Diagnosis not present

## 2020-01-03 DIAGNOSIS — M9701XA Periprosthetic fracture around internal prosthetic right hip joint, initial encounter: Secondary | ICD-10-CM | POA: Diagnosis present

## 2020-01-03 DIAGNOSIS — Z96641 Presence of right artificial hip joint: Secondary | ICD-10-CM | POA: Diagnosis present

## 2020-01-03 DIAGNOSIS — W19XXXA Unspecified fall, initial encounter: Secondary | ICD-10-CM

## 2020-01-03 DIAGNOSIS — Z90710 Acquired absence of both cervix and uterus: Secondary | ICD-10-CM

## 2020-01-03 DIAGNOSIS — M353 Polymyalgia rheumatica: Secondary | ICD-10-CM | POA: Diagnosis present

## 2020-01-03 DIAGNOSIS — Z955 Presence of coronary angioplasty implant and graft: Secondary | ICD-10-CM

## 2020-01-03 DIAGNOSIS — M79672 Pain in left foot: Secondary | ICD-10-CM

## 2020-01-03 DIAGNOSIS — Z96659 Presence of unspecified artificial knee joint: Secondary | ICD-10-CM

## 2020-01-03 DIAGNOSIS — M25551 Pain in right hip: Secondary | ICD-10-CM | POA: Diagnosis present

## 2020-01-03 DIAGNOSIS — Z96612 Presence of left artificial shoulder joint: Secondary | ICD-10-CM | POA: Diagnosis present

## 2020-01-03 DIAGNOSIS — I252 Old myocardial infarction: Secondary | ICD-10-CM | POA: Diagnosis not present

## 2020-01-03 DIAGNOSIS — S72491A Other fracture of lower end of right femur, initial encounter for closed fracture: Principal | ICD-10-CM | POA: Diagnosis present

## 2020-01-03 DIAGNOSIS — S72341A Displaced spiral fracture of shaft of right femur, initial encounter for closed fracture: Secondary | ICD-10-CM | POA: Diagnosis not present

## 2020-01-03 LAB — CBC WITH DIFFERENTIAL/PLATELET
Abs Immature Granulocytes: 0.03 10*3/uL (ref 0.00–0.07)
Basophils Absolute: 0.1 10*3/uL (ref 0.0–0.1)
Basophils Relative: 1 %
Eosinophils Absolute: 0.1 10*3/uL (ref 0.0–0.5)
Eosinophils Relative: 1 %
HCT: 36.7 % (ref 36.0–46.0)
Hemoglobin: 10.9 g/dL — ABNORMAL LOW (ref 12.0–15.0)
Immature Granulocytes: 0 %
Lymphocytes Relative: 31 %
Lymphs Abs: 2.2 10*3/uL (ref 0.7–4.0)
MCH: 27.8 pg (ref 26.0–34.0)
MCHC: 29.7 g/dL — ABNORMAL LOW (ref 30.0–36.0)
MCV: 93.6 fL (ref 80.0–100.0)
Monocytes Absolute: 0.5 10*3/uL (ref 0.1–1.0)
Monocytes Relative: 7 %
Neutro Abs: 4.5 10*3/uL (ref 1.7–7.7)
Neutrophils Relative %: 60 %
Platelets: 174 10*3/uL (ref 150–400)
RBC: 3.92 MIL/uL (ref 3.87–5.11)
RDW: 14 % (ref 11.5–15.5)
WBC: 7.3 10*3/uL (ref 4.0–10.5)
nRBC: 0 % (ref 0.0–0.2)

## 2020-01-03 LAB — BASIC METABOLIC PANEL
Anion gap: 8 (ref 5–15)
BUN: 32 mg/dL — ABNORMAL HIGH (ref 8–23)
CO2: 22 mmol/L (ref 22–32)
Calcium: 9.1 mg/dL (ref 8.9–10.3)
Chloride: 111 mmol/L (ref 98–111)
Creatinine, Ser: 1.16 mg/dL — ABNORMAL HIGH (ref 0.44–1.00)
GFR calc Af Amer: 51 mL/min — ABNORMAL LOW (ref >60)
GFR calc non Af Amer: 44 mL/min — ABNORMAL LOW (ref >60)
Glucose, Bld: 102 mg/dL — ABNORMAL HIGH (ref 70–99)
Potassium: 4.6 mmol/L (ref 3.5–5.1)
Sodium: 141 mmol/L (ref 135–145)

## 2020-01-03 LAB — SARS CORONAVIRUS 2 BY RT PCR (HOSPITAL ORDER, PERFORMED IN ~~LOC~~ HOSPITAL LAB): SARS Coronavirus 2: NEGATIVE

## 2020-01-03 MED ORDER — HYDROMORPHONE HCL 1 MG/ML IJ SOLN
0.5000 mg | INTRAMUSCULAR | Status: DC | PRN
  Administered 2020-01-04 – 2020-01-08 (×11): 0.5 mg via INTRAVENOUS
  Filled 2020-01-03 (×11): qty 1

## 2020-01-03 MED ORDER — HYDROCODONE-ACETAMINOPHEN 5-325 MG PO TABS
1.0000 | ORAL_TABLET | Freq: Four times a day (QID) | ORAL | Status: DC | PRN
  Administered 2020-01-04: 2 via ORAL
  Administered 2020-01-04: 1 via ORAL
  Administered 2020-01-04 – 2020-01-08 (×11): 2 via ORAL
  Filled 2020-01-03 (×13): qty 2

## 2020-01-03 MED ORDER — METOPROLOL TARTRATE 25 MG PO TABS
25.0000 mg | ORAL_TABLET | Freq: Two times a day (BID) | ORAL | Status: DC
  Administered 2020-01-04 – 2020-01-08 (×10): 25 mg via ORAL
  Filled 2020-01-03 (×11): qty 1

## 2020-01-03 MED ORDER — HYDROMORPHONE HCL 1 MG/ML IJ SOLN: 0.5000 mg | INTRAMUSCULAR | Status: DC | PRN

## 2020-01-03 MED ORDER — PANTOPRAZOLE SODIUM 40 MG PO TBEC
40.0000 mg | DELAYED_RELEASE_TABLET | Freq: Every day | ORAL | Status: DC
  Administered 2020-01-04 – 2020-01-08 (×5): 40 mg via ORAL
  Filled 2020-01-03 (×5): qty 1

## 2020-01-03 MED ORDER — MELATONIN 3 MG PO TABS
18.0000 mg | ORAL_TABLET | Freq: Every day | ORAL | Status: DC
  Administered 2020-01-04: 15 mg via ORAL
  Administered 2020-01-04 – 2020-01-07 (×4): 18 mg via ORAL
  Filled 2020-01-03 (×5): qty 6

## 2020-01-03 MED ORDER — BUSPIRONE HCL 5 MG PO TABS
15.0000 mg | ORAL_TABLET | Freq: Every day | ORAL | Status: DC
  Administered 2020-01-04 – 2020-01-08 (×5): 15 mg via ORAL
  Filled 2020-01-03: qty 2
  Filled 2020-01-03 (×4): qty 1

## 2020-01-03 MED ORDER — ATORVASTATIN CALCIUM 10 MG PO TABS
20.0000 mg | ORAL_TABLET | Freq: Every day | ORAL | Status: DC
  Administered 2020-01-04 – 2020-01-08 (×5): 20 mg via ORAL
  Filled 2020-01-03 (×5): qty 2

## 2020-01-03 MED ORDER — CLOPIDOGREL BISULFATE 75 MG PO TABS: 75.0000 mg | ORAL_TABLET | Freq: Every day | ORAL | Status: DC

## 2020-01-03 MED ORDER — LOSARTAN POTASSIUM 50 MG PO TABS
50.0000 mg | ORAL_TABLET | Freq: Every day | ORAL | Status: DC
  Administered 2020-01-05 – 2020-01-08 (×3): 50 mg via ORAL
  Filled 2020-01-03 (×5): qty 1

## 2020-01-03 MED ORDER — LOSARTAN POTASSIUM-HCTZ 50-12.5 MG PO TABS: 1.0000 | ORAL_TABLET | Freq: Every day | ORAL | Status: DC

## 2020-01-03 MED ORDER — HYDROMORPHONE HCL 1 MG/ML IJ SOLN
1.0000 mg | Freq: Once | INTRAMUSCULAR | Status: AC
  Administered 2020-01-03: 1 mg via INTRAVENOUS
  Filled 2020-01-03: qty 1

## 2020-01-03 MED ORDER — HYDROCHLOROTHIAZIDE 12.5 MG PO CAPS
12.5000 mg | ORAL_CAPSULE | Freq: Every day | ORAL | Status: DC
  Administered 2020-01-05 – 2020-01-08 (×3): 12.5 mg via ORAL
  Filled 2020-01-03 (×5): qty 1

## 2020-01-03 MED ORDER — MORPHINE SULFATE (PF) 4 MG/ML IV SOLN
4.0000 mg | Freq: Once | INTRAVENOUS | Status: AC
  Administered 2020-01-03: 4 mg via INTRAVENOUS
  Filled 2020-01-03: qty 1

## 2020-01-03 MED ORDER — SENNOSIDES-DOCUSATE SODIUM 8.6-50 MG PO TABS: 1.0000 | ORAL_TABLET | Freq: Every evening | ORAL | Status: DC | PRN

## 2020-01-03 NOTE — ED Notes (Signed)
Pt taken to XRAY via stretcher in stable condition with face mask in place.

## 2020-01-03 NOTE — ED Triage Notes (Addendum)
Pt arrives via EMS for a fall. Pt reports turning after fixing blinds and then falling. Doesn't recall tripping over anything. Denies LOC or hitting head. R knee and R hip pain. 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 EMS given 271mcg of Fentanyl. 20G L forearm placed. AOx4 GCS15   BP: 155/60, RR17, HR87, O2 98% RA.

## 2020-01-03 NOTE — ED Provider Notes (Signed)
Face-to-face evaluation   History: Patient was turning after adjusting her blinds when she fell and injured her right knee and hip.  She was transferred by EMS and received fentanyl during transport and arrives in persistent moderate pain.  Physical exam: Alert, calm, somewhat overweight.  Right leg is shortened.  Tender right hip and right knee.  Right knee swollen with questionable injury to distal quadriceps.  Clinical Course as of [name] 02 2310  Wed [name] 03, 2020  2140 DG Hips Bilat W or Wo Pelvis 3-4 Views [EW]  2303 Normal except hemoglobin low  WBC: 7.3 [EW]  2303 Normal except glucose high, BUN high, creatinine high, GFR low  Basic metabolic panel(!) [EW]  4696 No fracture or dislocation, interpreted by me  DG Hips Bilat W or Wo Pelvis 3-4 Views [EW]  2309 Periprostatic, distal femur fracture, angulated, interpreted by me  DG Knee 1-2 Views Right [EW]  2310 No CHF or infiltrate, interpreted by me  DG Chest 1 View [EW]    Clinical Course User Index [EW] Daleen Bo, MD    Patient Vitals for the past 24 hrs:  BP Temp Temp src Pulse Resp SpO2 Height Weight  01/03/20 2100 (!) 117/53 -- -- 67 17 99 % -- --  01/03/20 1953 -- -- -- -- -- -- 5\' 3"  (1.6 m) 79.4 kg  01/03/20 1952 (!) 150/95 (!) 97.4 F (36.3 C) Oral 75 12 98 % -- --  01/03/20 1946 -- -- -- -- -- 98 % -- --    11:11 PM Reevaluation with update and discussion. After initial assessment and treatment, an updated evaluation reveals she continues to be in pain, findings discussed with the patient and her friend, all questions answered. Daleen Bo   Medical Decision Making:  This patient is presenting for evaluation of injuries from fall, which does require a range of treatment options, and is a complaint that involves a high risk of morbidity and mortality. The differential diagnoses include contusion, fracture, ligamentous injury. I decided to review old records, and in summary patient with chronic stable medical  problems with accidental fall, likely mechanical, with primary injury to the right knee region..  I obtained additional historical information from a friend with her.  Clinical Laboratory Tests Ordered, included CBC and Metabolic panel, Covid testing. Review indicates mild renal insufficiency and low hemoglobin. Radiologic Tests Ordered, included pelvis, right knee.  I independently Visualized: Radiographs images, which show distal femur fracture which is periprosthetic, hip and pelvis normal   Critical Interventions-clinical evaluation, imaging to evaluate injuries, screening medical clearance testing, observation reassessment.  After These Interventions, the Patient was reevaluated and was found with fracture requiring surgical repair.  Fractures at the distal femur.  Hips and pelvis appear unaffected.  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  Patient has chronic medical problems.  Orthopedics contacted for surgical repair, hospitalist contacted for admission/medical clearance.  CRITICAL CARE-yes Performed by: Daleen Bo  .Critical Care Performed by: Daleen Bo, MD Authorized by: Daleen Bo, MD   Critical care provider statement:    Critical care time (minutes):  35   Critical care start time:  01/03/2020 8:15 PM   Critical care end time:  01/03/2020 11:31 PM   Critical care time was exclusive of:  Separately billable procedures and treating other patients   Critical care was necessary to treat or prevent imminent or life-threatening deterioration of the following conditions:  Trauma   Critical care was time spent personally by me on the following activities:  Blood draw for specimens, development of treatment plan with patient or surrogate, discussions with consultants, evaluation of patient's response to treatment, examination of patient, obtaining history from patient or surrogate, ordering and performing treatments and interventions, ordering and review of laboratory studies,  pulse oximetry, re-evaluation of patient's condition, review of old charts and ordering and review of radiographic studies    Nursing Notes Reviewed/ Care Coordinated Applicable Imaging Reviewed Interpretation of Laboratory Data incorporated into ED treatment  Consultation orthopedics for evaluation and treatment.  Case discussed with Dr. Onnie Graham, 11:25 PM, he will have his service involved as a Optometrist for operative management tomorrow.  11:29 PM-Consult complete with Hospitalist. Patient case explained and discussed.  Hospitalist agrees to admit patient for further evaluation and treatment. Call ended at 11:45 PM  Medical screening examination/treatment/procedure(s) were conducted as a shared visit with non-physician practitioner(s) and myself.  I personally evaluated the patient during the encounter    Daleen Bo, MD 01/03/20 2350

## 2020-01-03 NOTE — ED Provider Notes (Signed)
[facility] EMERGENCY DEPARTMENT Provider Note   CSN: [id number] Arrival date & time: 01/03/20  1937     History Chief Complaint  Patient presents with  . Fall    [name] [name] is a 79 y.o. female   HPI  Patient is an 79 year old female with past medical history significant for right-sided hip and knee replacement over 5 years ago and numerous medical issues which are detailed below in [place]  Patient presents today via EMS after mechanical fall that occurred just prior to arrival.  Patient states that she was turning away from the blinds after moving them when she said that she had a mechanical trip over her own foot which caused her to fall to the ground from standing.  She states that she may have struck her right knee against the brake fireplace hearth.  She states she has significant 10/10 severe right knee pain that is significantly worse with movement and touch.  She states she was unable to stand up or walk or straighten her leg.  She states she also has right hip pain which is achy, 7/10, worse with touch and movement.  She denies any head injury or loss of consciousness.  Denies any blood thinner use, nausea, vomiting, confusion, lightheadedness or dizziness.  Denies any chest pain or shortness of breath.  She states she had no symptoms prior to falling such as lightheadedness or dizziness.  She states it was purely mechanical fall.  She denies any upper extremity pain although she states that she had a carpal tunnel surgery done recently on her left wrist she states that she has no new pain there.      Past Medical History:  Diagnosis Date  . Anemia    of chronic disease  . Anxiety   . Arthritis    "psoratic arthritis; new dx" (09/11/2016)  . CAD S/P percutaneous coronary angioplasty 08/2016   mLCx 90% -> 0% (2.75 mm x 16 mm) Synergy DES PCI Ostial D1 70% (not PTCA target). Moderate ostial and mD2.  LAD tandem 50% stenoses w/ negative FFR (0.86)  . Cancer  ([place])    skin cancer on leg  . Chronic lower back pain   . Complication of anesthesia 1980 and 1982   trouble waking up after breast biopsy, many surgeries since no problem  . Depression   . Dyspnea on effort - CHRONIC    partial relief of Sx post PCI of L Circumflex.  . Fibrocystic breast   . GERD (gastroesophageal reflux disease)   . Headache    "monthly" (09/11/2016)  . History of blood transfusion    "several since age 54; I've had them after most of my surgeries" (09/11/2016)  . History of myocardial infarction    "doctor said there are signs I've had a heart attack; don't know when"  - Myoview [name] 2018 indicated small Inferior - Inferoseptal Infarct (vs. rest ischemia). Echo with inferoseptal hypokinesis.  Marland Kitchen Hx of skin cancer, basal cell 2013 and 2012   right inner, lower leg; several scattered around my body  . Hypertension   . Iron deficiency anemia    "I've had an iron infusion"  . Osteoarthritis   . Osteopenia   . Peptic ulcer disease    H pylori  . PMR (polymyalgia rheumatica) (HCC)   . PONV (postoperative nausea and vomiting)   . Psoriasis     Patient Active Problem List   Diagnosis Date Noted  . Renal insufficiency 11/01/2019  .  Hyponatremia 06/19/2019  . Pedal edema 10/04/2018  . Grief reaction 09/17/2018  . 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 Routine general medical examination at a health care facility 10/14/2017  . Preop cardiovascular exam 10/01/2017  . CAD S/P percutaneous coronary angioplasty 01/27/2017  . Aortic regurgitation 01/27/2017  . Dyspnea on exertion 08/19/2016  . Left bundle branch block (LBBB) on electrocardiogram 08/19/2016  . Estrogen deficiency 10/08/2015  . Chronic fatigue 08/20/2015  . Deficiency anemia 11/17/2013  . Encounter for Medicare annual wellness exam 07/17/2013  . PMR (polymyalgia rheumatica) (HCC) 07/17/2013  . Intertrigo 04/19/2013  . Expected blood loss anemia 04/05/2013  . Obesity (BMI 30-39.9) 04/05/2013  .  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 IBS (irritable bowel syndrome) 05/30/2012  . Anemia of chronic disease 04/16/2010  . Generalized anxiety disorder 04/16/2010  . GERD 03/04/2009  . Vitamin D deficiency 03/12/2008  . URINARY TRACT INFECTION, RECURRENT 08/07/2007  . 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 FIBROCYSTIC BREAST DISEASE, HX OF 02/11/2007  . Hyperlipidemia with low HDL 01/10/2007  . DEPRESSION 01/10/2007  . RETINAL VEIN OCCLUSION 01/10/2007  . Essential hypertension 01/10/2007  . PEPTIC ULCER DISEASE 01/10/2007  . 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 SKIN CANCER, HX OF 01/10/2007    Past Surgical History:  Procedure Laterality Date  . APPENDECTOMY    . BACK SURGERY     x 6   . BREAST BIOPSY Left 1980 and 1982   "benign"  . CARDIAC CATHETERIZATION N/A 09/11/2016   Procedure: Left Heart Cath and Coronary Angiography;  Surgeon: Leonie Man, MD;  Location: [facility];  Service: Cardiovascular: mLCx 90% * (PCI). Ostial D1 70% (not PTCA target). Mod ostial and mD2.  LAD tandem 50% stenoses w/ negative FFR (0.86)  . CARDIAC CATHETERIZATION N/A 09/11/2016   Procedure: Intravascular Pressure Wire/FFR Study;  Surgeon: Leonie Man, MD;  Location: [facility];  Service: Cardiovascular: --  LAD tandem 50% stenoses w/ negative FFR (0.86  . CARDIAC CATHETERIZATION N/A 09/11/2016   Procedure: Coronary Stent Intervention;  Surgeon: Leonie Man, MD;  Location: [facility];  Service: Cardiovascular: -- DES PCI mLCx 90%--0% SYNERGY DES 2.75 MM X 16 MM.  Marland Kitchen CARDIAC CATHETERIZATION  2001   non-obstructive CAD  . CATARACT EXTRACTION W/ INTRAOCULAR LENS  IMPLANT, BILATERAL  July -  August 2017  . DILATION AND CURETTAGE OF UTERUS  X 3  . ESOPHAGOGASTRODUODENOSCOPY  03/2009   ulcer,HP, GERD stricture  . EXCISIONAL HEMORRHOIDECTOMY    . HIP ARTHROPLASTY Left 2010  . JOINT REPLACEMENT    . KNEE ARTHROSCOPY Bilateral   . LAPAROSCOPIC CHOLECYSTECTOMY  2005  . LUMBAR DISC  SURGERY  11/1984; 1987; 1989; 2004; 2005 X 2   deg. disk lumber spine   . MOHS SURGERY Right    "inside my lower leg"  . NASAL SINUS SURGERY  1970s  . NM MYOVIEW LTD  08/28/2016   INTERMEDIATE RISK (b/c reduced EF & ? small infarct) -- EF ~35-40%.  ? Prior Small Inferior-apical & septal infarct (w/o ischemia).  Difuse HK - worse in inferoapex.  Suggest prior infarct with no ischemia. Ischemic area correlates with echo WMA  . REVERSE SHOULDER ARTHROPLASTY Left 12/17/2017   Procedure: LEFT REVERSE SHOULDER ARTHROPLASTY;  Surgeon: Netta Cedars, MD;  Location: [facility];  Service: Orthopedics;  Laterality: Left;  . TOTAL HIP ARTHROPLASTY Right 04/04/2013   Procedure: RIGHT TOTAL HIP ARTHROPLASTY ANTERIOR APPROACH;  Surgeon: Mauri Pole, MD;  Location: [facility];  Service: Orthopedics;  Laterality: Right;  . TOTAL KNEE ARTHROPLASTY Right 2009  . TRANSTHORACIC ECHOCARDIOGRAM  08/2016   mild conc LVH. EF 45-50% - anteroseptal & inferoseptal HK. Mod AI.  Marland Kitchen TRANSTHORACIC ECHOCARDIOGRAM  08/2017   (post PCI) --> EF normalized:  Normal LV systolic function; mild diastolic dysfunction; mild   LVH; sclerotic aortic valve with mild AI.  No RWMA  . TUBAL LIGATION    . VAGINAL HYSTERECTOMY  1970s   partial      OB History   No obstetric history on file.     Family History  Problem Relation Age of Onset  . Arthritis Mother   . Emphysema Mother   . Hyperlipidemia Mother   . Hypertension Mother   . Heart disease Mother   . Heart disease Brother        CAD  . Diabetes Paternal Grandmother   . Heart disease Maternal Aunt     Social History   Tobacco Use  . Smoking status: Never Smoker  . Smokeless tobacco: Never Used  Substance Use Topics  . Alcohol use: Yes    Alcohol/week: 0.0 standard drinks    Comment: 09/11/2016 "might have a margarita q 3-4 months"  . Drug use: No    Home Medications Prior to Admission medications   Medication Sig Start Date End Date Taking? Authorizing Provider    acetaminophen (TYLENOL) 500 MG tablet Take 1,000 mg by mouth 2 (two) times daily as needed for moderate pain or headache.     [provider]  atorvastatin (LIPITOR) 20 MG tablet TAKE 1 TABLET BY MOUTH EVERY DAY 10/11/19   Leonie Man, MD  busPIRone (BUSPAR) 15 MG tablet Take 1 tablet (15 mg total) by mouth daily. 11/01/19   Tower, [name], MD  cetirizine (ZYRTEC) 10 MG tablet Take 1 tablet (10 mg total) by mouth daily. 06/16/19   Elby Beck, FNP  clopidogrel (PLAVIX) 75 MG tablet TAKE 1 TABLET BY MOUTH EVERY DAY WITH BREAKFAST 11/27/19   Leonie Man, MD  esomeprazole (NEXIUM) 40 MG capsule esomeprazole magnesium 40 mg capsule,delayed release    [provider]  fluticasone (FLONASE) 50 MCG/ACT nasal spray Place 2 sprays into both nostrils daily. 06/16/19   Elby Beck, FNP  HYDROcodone-acetaminophen (NORCO/VICODIN) 5-325 MG tablet Take 1-2 tablets by mouth every 4 (four) hours as needed for moderate pain or severe pain. 12/17/17   Netta Cedars, MD  losartan-hydrochlorothiazide [facility]) 50-12.5 MG tablet Take 1 tablet by mouth daily. 12/27/19   Leonie Man, MD  Melatonin 3 MG CAPS Take 9 mg by mouth at bedtime.     [provider]  metoprolol tartrate (LOPRESSOR) 25 MG tablet TAKE 1 TABLET BY MOUTH TWICE A DAY 11/22/19   Leonie Man, MD  nitroGLYCERIN (NITROSTAT) 0.4 MG SL tablet Place 1 tablet (0.4 mg total) under the tongue every 5 (five) minutes as needed for chest pain. 09/08/16 02/21/19  Lyda Jester M, PA-C  pantoprazole (PROTONIX) 40 MG tablet Take 1 tablet (40 mg total) by mouth daily. 05/23/19   Leonie Man, MD  Vitamin D, Cholecalciferol, 25 MCG (1000 UT) CAPS Take 4 capsules by mouth daily.     [provider]    Allergies    Sulfonamide derivatives, Ace inhibitors, Oxaprozin, Pregabalin, and Amoxicillin-pot clavulanate  Review of Systems   Review of Systems  Constitutional: Negative for chills and fever.   HENT: Negative for congestion.   Eyes: Negative for pain.  Respiratory: Negative for cough and shortness of breath.   Cardiovascular: Negative for chest pain and leg swelling.  Gastrointestinal: Negative for abdominal pain and vomiting.  Genitourinary: Negative for dysuria.  Musculoskeletal: Negative for myalgias.       Right hip and right knee pain  Skin: Negative for rash.  Neurological: Negative for dizziness, tremors, seizures, syncope, speech difficulty, weakness, light-headedness, numbness and headaches.    Physical Exam Updated Vital Signs BP (!) 150/95 (BP Location: Right Arm)   Pulse 75   Temp (!) 97.4 F (36.3 C) (Oral)   Resp 12   Ht 5\' 3"  (1.6 m)   Wt 79.4 kg   SpO2 98%   BMI 31.00 kg/m   Physical Exam Vitals and nursing note reviewed.  Constitutional:      General: She is not in acute distress. HENT:     Head: Normocephalic and atraumatic.     Nose: Nose normal.  Eyes:     General: No scleral icterus. Cardiovascular:     Rate and Rhythm: Normal rate and regular rhythm.     Pulses: Normal pulses.     Heart sounds: Normal heart sounds.     Comments: Radial and DP pulses palpated BL.  Pulmonary:     Effort: Pulmonary effort is normal. No respiratory distress.     Breath sounds: No wheezing.  Abdominal:     Palpations: Abdomen is soft.     Tenderness: There is no abdominal tenderness.  Musculoskeletal:     Cervical back: Normal range of motion.     Right lower leg: No edema.     Left lower leg: No edema.     Comments: Patient has significant anterior proximal tibial tenderness to palpation with focal bony tenderness of the tibia.  She also has tenderness of the right hip.  She is able to wiggle her toes but is unable to move actively or passively secondary to pain.  No neck or back midline tenderness, step-off, deformity, or bruising. Able to turn head left and right 45 degrees without difficulty.  No marks on head.  Head is atraumatic and  normocephalic.  No chest wall tenderness, no facial or cranial tenderness.   Upper extremities are without any tenderness.  Left hand is any unable to grip tightly secondary to recent carpal tunnel surgery but no bony tenderness of either the upper extremity  Skin:    General: Skin is warm and dry.     Capillary Refill: Capillary refill takes less than 2 seconds.  Neurological:     Mental Status: She is alert. Mental status is at baseline.     Comments: Patient has intact sensation grossly in lower and upper extremities. Intact patellar and ankle reflexes. Patient able to ambulate without difficulty.   Patient has good proprioception with finger-to-nose.  Cranial nerves grossly intact.  ANO x3  Psychiatric:        Mood and Affect: Mood normal.        Behavior: Behavior normal.     ED Results / Procedures / Treatments   Labs (all labs ordered are listed, but only abnormal results are displayed) Labs Reviewed - No data to display  EKG None  Radiology No results found.  Procedures Procedures (including critical care time)  Medications Ordered in ED Medications  morphine 4 MG/ML injection 4 mg (4 mg Intravenous Given 01/03/20 2108)    ED Course  I have reviewed the triage vital signs and the nursing notes.  Pertinent labs & imaging  results that were available during my care of the patient were reviewed by me and considered in my medical decision making (see chart for details).  Patient is 79 year old female with past medical history detailed above presented today for mechanical fall.  She has right knee and right hip pain.  Concern for fractures of the right tibia/fibula.  Concern for right hip fracture.  Will obtain plain film imaging of both of these areas.  Patient given fentanyl by EMS.  Will dose with morphine and reevaluate pain.  Patient has tenderness to palpation of the bony regions of her right hip and her right knee.  Unable to fully assess range of motion secondary  to pain.  Will reassess after imaging. I discussed with my attending physician whether or not to obtain CT imaging of the head and C-spine.  As patient states that she did not strike her head and has no evidence of physical exam striking her head has no headache nausea, vomiting or neck pain is alert, oriented and grossly neurologically intact will not further evaluate C-spine or head with advanced imaging at this time.  I discussed this case with my attending physician who cosigned this note including patient's presenting symptoms, physical exam, and planned diagnostics and interventions. Attending physician stated agreement with plan or made changes to plan which were implemented.   Attending physician assessed patient at bedside.  Patient care transferred to Dr. Eulis Foster for follow-up on plain film imaging of right hip and right knee. He will reevaluate and disposition appropriately. 9:10 PM    MDM Rules/Calculators/A&P                       Final Clinical Impression(s) / ED Diagnoses Final diagnoses:  Fall, initial encounter    Rx / DC Orders ED Discharge Orders    None       Tedd Sias, [place] 01/03/20 2110    Daleen Bo, MD 01/03/20 2350

## 2020-01-04 ENCOUNTER — Telehealth: Payer: Self-pay | Admitting: *Deleted

## 2020-01-04 ENCOUNTER — Encounter (HOSPITAL_COMMUNITY)
Admission: EM | Disposition: A | Discharge status: Skilled Nursing Facility | Payer: Self-pay | Source: Home / Self Care | Attending: Internal Medicine

## 2020-01-04 ENCOUNTER — Inpatient Hospital Stay (HOSPITAL_COMMUNITY): Payer: Medicare Other | Admitting: Certified Registered Nurse Anesthetist

## 2020-01-04 ENCOUNTER — Encounter (HOSPITAL_COMMUNITY): Payer: Self-pay | Admitting: Internal Medicine

## 2020-01-04 ENCOUNTER — Inpatient Hospital Stay (HOSPITAL_COMMUNITY): Payer: Medicare Other

## 2020-01-04 DIAGNOSIS — I10 Essential (primary) hypertension: Secondary | ICD-10-CM

## 2020-01-04 DIAGNOSIS — D638 Anemia in other chronic diseases classified elsewhere: Secondary | ICD-10-CM

## 2020-01-04 DIAGNOSIS — I2510 Atherosclerotic heart disease of native coronary artery without angina pectoris: Secondary | ICD-10-CM

## 2020-01-04 DIAGNOSIS — I447 Left bundle-branch block, unspecified: Secondary | ICD-10-CM

## 2020-01-04 DIAGNOSIS — S72401A Unspecified fracture of lower end of right femur, initial encounter for closed fracture: Secondary | ICD-10-CM

## 2020-01-04 DIAGNOSIS — Z9861 Coronary angioplasty status: Secondary | ICD-10-CM

## 2020-01-04 DIAGNOSIS — S72341A Displaced spiral fracture of shaft of right femur, initial encounter for closed fracture: Secondary | ICD-10-CM

## 2020-01-04 HISTORY — PX: ORIF FEMUR FRACTURE: SHX2119

## 2020-01-04 LAB — CBC
HCT: 29.4 % — ABNORMAL LOW (ref 36.0–46.0)
Hemoglobin: 9 g/dL — ABNORMAL LOW (ref 12.0–15.0)
MCH: 28.5 pg (ref 26.0–34.0)
MCHC: 30.6 g/dL (ref 30.0–36.0)
MCV: 93 fL (ref 80.0–100.0)
Platelets: 157 10*3/uL (ref 150–400)
RBC: 3.16 MIL/uL — ABNORMAL LOW (ref 3.87–5.11)
RDW: 13.9 % (ref 11.5–15.5)
WBC: 9 10*3/uL (ref 4.0–10.5)
nRBC: 0 % (ref 0.0–0.2)

## 2020-01-04 LAB — SURGICAL PCR SCREEN
MRSA, PCR: NEGATIVE
Staphylococcus aureus: NEGATIVE

## 2020-01-04 LAB — CREATININE, SERUM
Creatinine, Ser: 1.19 mg/dL — ABNORMAL HIGH (ref 0.44–1.00)
GFR calc Af Amer: 50 mL/min — ABNORMAL LOW (ref >60)
GFR calc non Af Amer: 43 mL/min — ABNORMAL LOW (ref >60)

## 2020-01-04 SURGERY — OPEN REDUCTION INTERNAL FIXATION (ORIF) DISTAL FEMUR FRACTURE
Anesthesia: General | Laterality: Right

## 2020-01-04 MED ORDER — MENTHOL 3 MG MT LOZG: 1.0000 | LOZENGE | OROMUCOSAL | Status: DC | PRN

## 2020-01-04 MED ORDER — FENTANYL CITRATE (PF) 100 MCG/2ML IJ SOLN: 50.0000 ug | Freq: Once | INTRAMUSCULAR | Status: AC

## 2020-01-04 MED ORDER — METOCLOPRAMIDE HCL 5 MG PO TABS: 5.0000 mg | ORAL_TABLET | Freq: Three times a day (TID) | ORAL | Status: DC | PRN

## 2020-01-04 MED ORDER — PHENOL 1.4 % MT LIQD: 1.0000 | OROMUCOSAL | Status: DC | PRN

## 2020-01-04 MED ORDER — 0.9 % SODIUM CHLORIDE (POUR BTL) OPTIME
TOPICAL | Status: DC | PRN
Start: 2020-01-04 — End: 2020-01-04
  Administered 2020-01-04: 1000 mL

## 2020-01-04 MED ORDER — MIDAZOLAM HCL 2 MG/2ML IJ SOLN
INTRAMUSCULAR | Status: AC
  Filled 2020-01-04: qty 2

## 2020-01-04 MED ORDER — LABETALOL HCL 5 MG/ML IV SOLN: 10.0000 mg | INTRAVENOUS | Status: DC | PRN

## 2020-01-04 MED ORDER — PHENYLEPHRINE 40 MCG/ML (10ML) SYRINGE FOR IV PUSH (FOR BLOOD PRESSURE SUPPORT)
PREFILLED_SYRINGE | INTRAVENOUS | Status: DC | PRN
  Administered 2020-01-04: 80 ug via INTRAVENOUS
  Administered 2020-01-04: 40 ug via INTRAVENOUS

## 2020-01-04 MED ORDER — SUGAMMADEX SODIUM 200 MG/2ML IV SOLN
INTRAVENOUS | Status: DC | PRN
  Administered 2020-01-04: 200 mg via INTRAVENOUS

## 2020-01-04 MED ORDER — FENTANYL CITRATE (PF) 250 MCG/5ML IJ SOLN
INTRAMUSCULAR | Status: AC
  Filled 2020-01-04: qty 5

## 2020-01-04 MED ORDER — ONDANSETRON HCL 4 MG/2ML IJ SOLN: 4.0000 mg | Freq: Once | INTRAMUSCULAR | Status: DC | PRN

## 2020-01-04 MED ORDER — METOCLOPRAMIDE HCL 5 MG/ML IJ SOLN: 5.0000 mg | Freq: Three times a day (TID) | INTRAMUSCULAR | Status: DC | PRN

## 2020-01-04 MED ORDER — PROPOFOL 10 MG/ML IV BOLUS
INTRAVENOUS | Status: DC | PRN
  Administered 2020-01-04: 110 mg via INTRAVENOUS

## 2020-01-04 MED ORDER — DEXAMETHASONE SODIUM PHOSPHATE 10 MG/ML IJ SOLN
INTRAMUSCULAR | Status: DC | PRN
  Administered 2020-01-04: 10 mg via INTRAVENOUS

## 2020-01-04 MED ORDER — LIDOCAINE 2% (20 MG/ML) 5 ML SYRINGE
INTRAMUSCULAR | Status: DC | PRN
  Administered 2020-01-04: 40 mg via INTRAVENOUS

## 2020-01-04 MED ORDER — LACTATED RINGERS IV SOLN: INTRAVENOUS | Status: DC

## 2020-01-04 MED ORDER — VANCOMYCIN HCL 1000 MG IV SOLR
INTRAVENOUS | Status: AC
  Filled 2020-01-04: qty 1000

## 2020-01-04 MED ORDER — DOCUSATE SODIUM 100 MG PO CAPS
100.0000 mg | ORAL_CAPSULE | Freq: Two times a day (BID) | ORAL | Status: DC
  Administered 2020-01-04 – 2020-01-08 (×8): 100 mg via ORAL
  Filled 2020-01-04 (×8): qty 1

## 2020-01-04 MED ORDER — SODIUM CHLORIDE 0.9 % IR SOLN
Status: DC | PRN
  Administered 2020-01-04: 3000 mL

## 2020-01-04 MED ORDER — ROCURONIUM BROMIDE 10 MG/ML (PF) SYRINGE
PREFILLED_SYRINGE | INTRAVENOUS | Status: DC | PRN
  Administered 2020-01-04: 60 mg via INTRAVENOUS

## 2020-01-04 MED ORDER — SODIUM CHLORIDE 0.9% IV SOLUTION: Freq: Once | INTRAVENOUS | Status: DC

## 2020-01-04 MED ORDER — CEFAZOLIN SODIUM-DEXTROSE 2-4 GM/100ML-% IV SOLN
INTRAVENOUS | Status: AC
  Filled 2020-01-04: qty 100

## 2020-01-04 MED ORDER — FENTANYL CITRATE (PF) 250 MCG/5ML IJ SOLN
INTRAMUSCULAR | Status: DC | PRN
  Administered 2020-01-04: 25 ug via INTRAVENOUS
  Administered 2020-01-04: 50 ug via INTRAVENOUS
  Administered 2020-01-04: 25 ug via INTRAVENOUS
  Administered 2020-01-04: 75 ug via INTRAVENOUS
  Administered 2020-01-04: 25 ug via INTRAVENOUS

## 2020-01-04 MED ORDER — FENTANYL CITRATE (PF) 100 MCG/2ML IJ SOLN
25.0000 ug | INTRAMUSCULAR | Status: DC | PRN
  Administered 2020-01-04 (×2): 25 ug via INTRAVENOUS

## 2020-01-04 MED ORDER — FENTANYL CITRATE (PF) 100 MCG/2ML IJ SOLN
INTRAMUSCULAR | Status: AC
  Filled 2020-01-04: qty 2

## 2020-01-04 MED ORDER — CEFAZOLIN SODIUM-DEXTROSE 2-4 GM/100ML-% IV SOLN
2.0000 g | Freq: Four times a day (QID) | INTRAVENOUS | Status: AC
  Administered 2020-01-04 – 2020-01-05 (×2): 2 g via INTRAVENOUS
  Filled 2020-01-04 (×2): qty 100

## 2020-01-04 MED ORDER — ONDANSETRON HCL 4 MG PO TABS: 4.0000 mg | ORAL_TABLET | Freq: Four times a day (QID) | ORAL | Status: DC | PRN

## 2020-01-04 MED ORDER — VANCOMYCIN HCL 1000 MG IV SOLR
INTRAVENOUS | Status: DC | PRN
  Administered 2020-01-04: 1000 mg via TOPICAL

## 2020-01-04 MED ORDER — ONDANSETRON HCL 4 MG/2ML IJ SOLN
INTRAMUSCULAR | Status: DC | PRN
  Administered 2020-01-04: 4 mg via INTRAVENOUS

## 2020-01-04 MED ORDER — OXYCODONE HCL 5 MG PO TABS: 5.0000 mg | ORAL_TABLET | Freq: Once | ORAL | Status: DC | PRN

## 2020-01-04 MED ORDER — ENOXAPARIN SODIUM 40 MG/0.4ML ~~LOC~~ SOLN
40.0000 mg | SUBCUTANEOUS | Status: DC
  Administered 2020-01-05 – 2020-01-08 (×4): 40 mg via SUBCUTANEOUS
  Filled 2020-01-04 (×4): qty 0.4

## 2020-01-04 MED ORDER — OXYCODONE HCL 5 MG/5ML PO SOLN: 5.0000 mg | Freq: Once | ORAL | Status: DC | PRN

## 2020-01-04 MED ORDER — ONDANSETRON HCL 4 MG/2ML IJ SOLN: 4.0000 mg | Freq: Four times a day (QID) | INTRAMUSCULAR | Status: DC | PRN

## 2020-01-04 MED ORDER — FENTANYL CITRATE (PF) 100 MCG/2ML IJ SOLN
INTRAMUSCULAR | Status: AC
  Administered 2020-01-04: 50 ug via INTRAVENOUS
  Filled 2020-01-04: qty 2

## 2020-01-04 SURGICAL SUPPLY — 74 items
ADH SKN CLS APL DERMABOND .7 (GAUZE/BANDAGES/DRESSINGS)
APPLICATOR CHLORAPREP 3ML ORNG (MISCELLANEOUS) ×1 IMPLANT
BAG DECANTER FOR FLEXI CONT (MISCELLANEOUS) ×1 IMPLANT
BIT DRILL 4.3 (BIT) ×2
BIT DRILL 4.3MM (BIT) ×1
BIT DRILL 4.3X300MM (BIT) IMPLANT
BIT DRILL LONG 3.3 (BIT) ×2 IMPLANT
BIT DRILL LONG 3.3MM (BIT) ×2
BIT DRILL QC 3.3X195 (BIT) ×2 IMPLANT
BNDG ELASTIC 4X5.8 VLCR STR LF (GAUZE/BANDAGES/DRESSINGS) ×2 IMPLANT
BNDG ELASTIC 6X5.8 VLCR STR LF (GAUZE/BANDAGES/DRESSINGS) ×2 IMPLANT
BRUSH FEMORAL CANAL (MISCELLANEOUS) IMPLANT
CAP LOCK NCB (Cap) ×18 IMPLANT
COVER BACK TABLE 24X17X13 BIG (DRAPES) IMPLANT
COVER WAND RF STERILE (DRAPES) ×1 IMPLANT
DERMABOND ADVANCED (GAUZE/BANDAGES/DRESSINGS)
DERMABOND ADVANCED .7 DNX12 (GAUZE/BANDAGES/DRESSINGS) ×1 IMPLANT
DRAPE HIP W/POCKET STRL (MISCELLANEOUS) ×1 IMPLANT
DRAPE IMP U-DRAPE 54X76 (DRAPES) ×1 IMPLANT
DRAPE INCISE IOBAN 66X45 STRL (DRAPES) ×1 IMPLANT
DRAPE INCISE IOBAN 85X60 (DRAPES) ×1 IMPLANT
DRAPE ORTHO SPLIT 77X108 STRL (DRAPES) ×60
DRAPE POUCH INSTRU U-SHP 10X18 (DRAPES) ×3 IMPLANT
DRAPE SURG ORHT 6 SPLT 77X108 (DRAPES) IMPLANT
DRAPE U-SHAPE 47X51 STRL (DRAPES) ×3 IMPLANT
DRSG AQUACEL AG ADV 3.5X10 (GAUZE/BANDAGES/DRESSINGS) ×1 IMPLANT
DRSG AQUACEL AG ADV 3.5X14 (GAUZE/BANDAGES/DRESSINGS) ×1 IMPLANT
DRSG MEPILEX BORDER 4X8 (GAUZE/BANDAGES/DRESSINGS) ×4 IMPLANT
DURAPREP 26ML APPLICATOR (WOUND CARE) ×1 IMPLANT
ELECT CAUTERY BLADE 6.4 (BLADE) ×3 IMPLANT
ELECT REM PT RETURN 9FT ADLT (ELECTROSURGICAL) ×3
ELECTRODE REM PT RTRN 9FT ADLT (ELECTROSURGICAL) ×1 IMPLANT
GLOVE BIO SURGEON STRL SZ8.5 (GLOVE) ×6 IMPLANT
GLOVE BIOGEL PI IND STRL 8.5 (GLOVE) ×1 IMPLANT
GLOVE BIOGEL PI INDICATOR 8.5 (GLOVE) ×2
GOWN STRL REUS W/ TWL LRG LVL3 (GOWN DISPOSABLE) ×2 IMPLANT
GOWN STRL REUS W/TWL 2XL LVL3 (GOWN DISPOSABLE) ×3 IMPLANT
GOWN STRL REUS W/TWL LRG LVL3 (GOWN DISPOSABLE) ×6
HANDPIECE INTERPULSE COAX TIP (DISPOSABLE) ×3
HOOD PEEL AWAY FLYTE STAYCOOL (MISCELLANEOUS) ×4 IMPLANT
K-WIRE 2.0 (WIRE) ×3
K-WIRE FXSTD 280X2XNS SS (WIRE) ×1
KIT BASIN OR (CUSTOM PROCEDURE TRAY) ×3 IMPLANT
KIT TURNOVER KIT B (KITS) ×3 IMPLANT
KWIRE FXSTD 280X2XNS SS (WIRE) IMPLANT
MANIFOLD NEPTUNE II (INSTRUMENTS) ×3 IMPLANT
NS IRRIG 1000ML POUR BTL (IV SOLUTION) ×4 IMPLANT
PACK TOTAL JOINT (CUSTOM PROCEDURE TRAY) ×3 IMPLANT
PACK UNIVERSAL I (CUSTOM PROCEDURE TRAY) ×1 IMPLANT
PAD ARMBOARD 7.5X6 YLW CONV (MISCELLANEOUS) ×6 IMPLANT
PAD CAST 4YDX4 CTTN HI CHSV (CAST SUPPLIES) IMPLANT
PADDING CAST COTTON 4X4 STRL (CAST SUPPLIES) ×3
PADDING CAST COTTON 6X4 STRL (CAST SUPPLIES) ×2 IMPLANT
PLATE DISTAL FEMUR 15H 317M RT (Plate) ×2 IMPLANT
SCREW 5.0 80MM (Screw) ×8 IMPLANT
SCREW NCB 4.0MX55M (Screw) ×2 IMPLANT
SCREW NCB 4.0X36MM (Screw) ×2 IMPLANT
SCREW NCB 5.0X34MM (Screw) ×4 IMPLANT
SCREW NCB 5.0X85MM (Screw) ×2 IMPLANT
SCREW UNI 5.0 12MM (Screw) ×2 IMPLANT
SEALER BIPOLAR AQUA 6.0 (INSTRUMENTS) ×1 IMPLANT
SET HNDPC FAN SPRY TIP SCT (DISPOSABLE) IMPLANT
SUT ETHIBOND NAB CT1 #1 30IN (SUTURE) ×1 IMPLANT
SUT MON AB 2-0 CT1 36 (SUTURE) ×1 IMPLANT
SUT VIC AB 1 CT1 27 (SUTURE)
SUT VIC AB 1 CT1 27XBRD ANBCTR (SUTURE) ×3 IMPLANT
SUT VIC AB 2-0 CT1 27 (SUTURE)
SUT VIC AB 2-0 CT1 TAPERPNT 27 (SUTURE) ×2 IMPLANT
SUT VLOC 180 0 24IN GS25 (SUTURE) ×1 IMPLANT
TOWEL GREEN STERILE (TOWEL DISPOSABLE) ×3 IMPLANT
TOWEL GREEN STERILE FF (TOWEL DISPOSABLE) ×3 IMPLANT
TOWER CARTRIDGE SMART MIX (DISPOSABLE) IMPLANT
TRAY FOLEY MTR SLVR 16FR STAT (SET/KITS/TRAYS/PACK) ×3 IMPLANT
WATER STERILE IRR 1000ML POUR (IV SOLUTION) ×2 IMPLANT

## 2020-01-04 NOTE — Anesthesia Preprocedure Evaluation (Addendum)
Anesthesia Evaluation  Patient identified by MRN, date of birth, ID band Patient awake    Reviewed: Allergy & Precautions, NPO status , Patient's Chart, lab work & pertinent test results  History of Anesthesia Complications (+) PONV and history of anesthetic complications  Airway Mallampati: II  TM Distance: >3 FB Neck ROM: Full    Dental  (+) Teeth Intact, Dental Advisory Given   Pulmonary    breath sounds clear to auscultation       Cardiovascular hypertension, Pt. on medications and Pt. on home beta blockers + CAD and + Cardiac Stents   Rhythm:Regular Rate:Normal     Neuro/Psych  Headaches, PSYCHIATRIC DISORDERS Anxiety Depression    GI/Hepatic Neg liver ROS, PUD, GERD  Medicated,  Endo/Other  negative endocrine ROS  Renal/GU Renal disease     Musculoskeletal  (+) Arthritis ,   Abdominal   Peds  Hematology   Anesthesia Other Findings   Reproductive/Obstetrics                            Anesthesia Physical Anesthesia Plan  ASA: III  Anesthesia Plan: General   Post-op Pain Management:    Induction: Intravenous  PONV Risk Score and Plan: 4 or greater and Ondansetron, Dexamethasone and Treatment may vary due to age or medical condition  Airway Management Planned: Oral ETT  Additional Equipment: None  Intra-op Plan:   Post-operative Plan: Extubation in OR  Informed Consent: I have reviewed the patients History and Physical, chart, labs and discussed the procedure including the risks, benefits and alternatives for the proposed anesthesia with the patient or authorized representative who has indicated his/her understanding and acceptance.     Dental advisory given  Plan Discussed with: CRNA and Anesthesiologist  Anesthesia Plan Comments:        Anesthesia Quick Evaluation

## 2020-01-04 NOTE — Progress Notes (Signed)
Initial Nutrition Assessment  DOCUMENTATION CODES:   Obesity unspecified  INTERVENTION:   -Magic cup TID with meals, each supplement provides 290 kcal and 9 grams of protein -MVI with minerals daily  NUTRITION DIAGNOSIS:   Increased nutrient needs related to post-op healing as evidenced by estimated needs.  GOAL:   Patient will meet greater than or equal to 90% of their needs  MONITOR:   PO intake, Supplement acceptance, Diet advancement, Labs, Weight trends, Skin, I & O's  REASON FOR ASSESSMENT:   Consult Hip fracture protocol  ASSESSMENT:   [name] [name] is a 79 y.o. female with medical history significant of CAD s/p PCI, 2019 echo showed preserved EF and mild AR, HTN, anemia of chronic dz.  Pt admitted with rt femur fracture, s/p mechanical fall.   Attempted to speak with pt via phone, however, no answer.   Pt currently NPO. Plan for ORIF this afternoon.   Reviewed wt hx; no weight loss over the past year.   Labs reviewed.   Diet Order:   Diet Order            Diet NPO time specified  Diet effective now              EDUCATION NEEDS:   No education needs have been identified at this time  Skin:  Skin Assessment: Reviewed RN Assessment  Last BM:  01/03/20  Height:   Ht Readings from Last 1 Encounters:  01/03/20 5\' 3"  (1.6 m)    Weight:   Wt Readings from Last 1 Encounters:  01/03/20 79.4 kg    Ideal Body Weight:  52.3 kg  BMI:  Body mass index is 31 kg/m.  Estimated Nutritional Needs:   Kcal:  1550-1750  Protein:  75-90 grams  Fluid:  > 1.5 L    Loistine Chance, RD, LDN, [place] Registered Dietitian II Certified Diabetes Care and Education Specialist Please refer to [facility] for RD and/or RD on-call/weekend/after hours pager

## 2020-01-04 NOTE — Progress Notes (Signed)
Contacted by EDP last night regarding Ms. Rodick who sustained a ground level fall with subsequent displaced right distal femur periprosthetic fracture.  X-rays reviewed.  I have spoken with my partner, Dr. Rod Can, who has an expertise in the management of complex periprosthetic fractures.  He will take over orthopedic care and anticipates surgical stabilization, and will make all necessary arrangements.  Marin Shutter MD

## 2020-01-04 NOTE — Anesthesia Postprocedure Evaluation (Signed)
Anesthesia Post Note  Patient: ORAH SONNEN  Procedure(s) Performed: OPEN REDUCTION INTERNAL FIXATION (ORIF) DISTAL FEMUR FRACTURE (Right )     Patient location during evaluation: PACU Anesthesia Type: General Level of consciousness: awake and alert Pain management: pain level controlled Vital Signs Assessment: post-procedure vital signs reviewed and stable Respiratory status: spontaneous breathing, nonlabored ventilation, respiratory function stable and patient connected to nasal cannula oxygen Cardiovascular status: blood pressure returned to baseline and stable Postop Assessment: no apparent nausea or vomiting Anesthetic complications: no    Last Vitals:  Vitals:   01/04/20 1915 01/04/20 1930  BP: (!) 157/76 (!) 129/56  Pulse: 90 81  Resp: 15 10  Temp:  36.4 C  SpO2: 100% 100%    Last Pain:  Vitals:   01/04/20 1925  TempSrc:   PainSc: [place]

## 2020-01-04 NOTE — Progress Notes (Signed)
PROGRESS NOTE    [name] [name]  KZS:[id number] DOB: 09/03/1940 DOA: 01/03/2020 PCP: Abner Greenspan, MD    Brief Narrative:  [name] [name] is an 79 year old female with past medical history remarkable for CAD status post PCI, HLD, essential hypertension, anemia of chronic disease, and GERD who presented to the ED following mechanical fall at home.  Patient complains of severe right knee pain worse with any type of movement associated with swelling.   In the ED, x-rays notable for right distal femur fracture.  ED physician contacted orthopedics will see in the a.m.  [facility] consulted for admission.   Assessment & Plan:   Principal Problem:   Fracture of right femur (HCC) Active Problems:   Anemia of chronic disease   Essential hypertension   Left bundle branch block (LBBB) on electrocardiogram   CAD S/P percutaneous coronary angioplasty   Femur fracture, right ([place])   Right femur fracture Patient presenting from home following mechanical fall.  Normally fairly mobile at baseline and utilizes a cane occasionally.  X-ray right knee with mildly displaced spiral fracture distal femur with extension to superior femoral prosthesis. --Orthopedics plan surgical intervention ORIF this afternoon with Dr. Lyla Glassing --NPO --Postoperative DVT prophylaxis and pain control per Ortho --PT/OT evaluation following surgical invention, may need SNF placement  Essential hypertension BP 130/29 this morning. --Continue metoprolol tartrate 25 mg p.o. twice daily --hold home losartan-HCTZ perioperatively, plan to resume tomorrow --Labetalol 10 mg IV every 4 hours as needed for SBP greater than 170 --Continue to monitor blood pressure closely  CAD s/p PCI --Continue to hold Plavix; last dose 0700 01/03/2020 --Continue statin  Depression --Continue buspirone 15 mg p.o. daily  HLD: Continue atorvastatin 10 mg p.o. daily  GERD: Continue PPI   DVT prophylaxis: SCDs, postoperatively per orthopedics  Code Status: Full code Family Communication: Updated patient's daughter present at bedside this morning  Disposition Plan:  Status is: Inpatient  Remains inpatient appropriate because:Ongoing active pain requiring inpatient pain management, Unsafe d/c plan and Inpatient level of care appropriate due to severity of illness for acute complicated right femur fracture   Dispo: The patient is from: Home              Anticipated d/c is to: To be determined              Anticipated d/c date is: 3 days              Patient currently is not medically stable to d/c.    Consultants:   [facility] - Dr. Lyla Glassing  Procedures:   Pending ORIF right femur  Antimicrobials:   None   Subjective: Patient seen and examined in the ED holding area.  Continues with pain right leg. Orthopedics planning on ORIF later this afternoon.  Daughter present at the bedside and updated.  No other complaints or concerns at this time.  Denies headache, no visual changes, no chest pain, no palpitations, no shortness of breath, no abdominal pain.  No acute events overnight per nursing staff.  Objective: Vitals:   01/04/20 1045 01/04/20 1115 01/04/20 1130 01/04/20 1217  BP: (!) 143/67 (!) 146/59 (!) 145/51 (!) 147/61  Pulse: 73 72 71 69  Resp:   15 14  Temp:   98.3 F (36.8 C) 98.2 F (36.8 C)  TempSrc:   Oral Oral  SpO2: 97% 97% 100% 97%  Weight:      Height:       No intake or output data in  the 24 hours ending 01/04/20 1323 Filed Weights   01/03/20 1953  Weight: 79.4 kg    Examination:  General exam: Appears calm and comfortable  Respiratory system: Clear to auscultation. Respiratory effort normal. Cardiovascular system: S1 & S2 heard, RRR. No JVD, murmurs, rubs, gallops or clicks. No pedal edema. Gastrointestinal system: Abdomen is nondistended, soft and nontender. No organomegaly or masses felt. Normal bowel sounds heard. Central nervous system: Alert and oriented. No focal neurological  deficits. Extremities: Tenderness to palpation right lower thigh, sensation grossly intact Skin: No rashes, lesions or ulcers Psychiatry: Judgement and insight appear normal. Mood & affect appropriate.     Data Reviewed: I have personally reviewed following labs and imaging studies  CBC: Recent Labs  Lab 01/03/20 2204  WBC 7.3  NEUTROABS 4.5  HGB 10.9*  HCT 36.7  MCV 93.6  PLT 702   Basic Metabolic Panel: Recent Labs  Lab 01/03/20 2204  NA 141  K 4.6  CL 111  CO2 22  GLUCOSE 102*  BUN 32*  CREATININE 1.16*  CALCIUM 9.1   GFR: Estimated Creatinine Clearance: 38.6 mL/min (A) (by C-G formula based on SCr of 1.16 mg/dL (H)). Liver Function Tests: No results for input(s): AST, ALT, ALKPHOS, BILITOT, PROT, ALBUMIN in the last 168 hours. No results for input(s): LIPASE, AMYLASE in the last 168 hours. No results for input(s): AMMONIA in the last 168 hours. Coagulation Profile: No results for input(s): INR, PROTIME in the last 168 hours. Cardiac Enzymes: No results for input(s): CKTOTAL, CKMB, CKMBINDEX, TROPONINI in the last 168 hours. BNP (last 3 results) No results for input(s): PROBNP in the last 8760 hours. HbA1C: No results for input(s): HGBA1C in the last 72 hours. CBG: No results for input(s): GLUCAP in the last 168 hours. Lipid Profile: No results for input(s): CHOL, HDL, LDLCALC, TRIG, CHOLHDL, LDLDIRECT in the last 72 hours. Thyroid Function Tests: No results for input(s): TSH, T4TOTAL, FREET4, T3FREE, THYROIDAB in the last 72 hours. Anemia Panel: No results for input(s): VITAMINB12, FOLATE, FERRITIN, TIBC, IRON, RETICCTPCT in the last 72 hours. Sepsis Labs: No results for input(s): PROCALCITON, LATICACIDVEN in the last 168 hours.  Recent Results (from the past 240 hour(s))  SARS Coronavirus 2 by RT PCR (hospital order, performed in [facility] hospital lab) Nasopharyngeal Nasopharyngeal Swab     Status: None   Collection Time: 01/03/20 10:13 PM    Specimen: Nasopharyngeal Swab  Result Value Ref Range Status   SARS Coronavirus 2 NEGATIVE NEGATIVE Final    Comment: (NOTE) SARS-CoV-2 target nucleic acids are NOT DETECTED. The SARS-CoV-2 RNA is generally detectable in upper and lower respiratory specimens during the acute phase of infection. The lowest concentration of SARS-CoV-2 viral copies this assay can detect is 250 copies / mL. A negative result does not preclude SARS-CoV-2 infection and should not be used as the sole basis for treatment or other patient management decisions.  A negative result may occur with improper specimen collection / handling, submission of specimen other than nasopharyngeal swab, presence of viral mutation(s) within the areas targeted by this assay, and inadequate number of viral copies (<250 copies / mL). A negative result must be combined with clinical observations, patient history, and epidemiological information. Fact Sheet for Patients:   StrictlyIdeas.no Fact Sheet for Healthcare Providers: BankingDealers.co.za This test is not yet approved or cleared  by the [place] FDA and has been authorized for detection and/or diagnosis of SARS-CoV-2 by FDA under an Emergency Use Authorization (EUA).  This EUA  will remain in effect (meaning this test can be used) for the duration of the COVID-19 declaration under Section 564(b)(1) of the Act, 21 U.S.C. section 360bbb-3(b)(1), unless the authorization is terminated or revoked sooner. Performed at [facility], [place] [address], [place], [place] [address]          Radiology Studies: DG Chest 1 View  Result Date: 01/03/2020 CLINICAL DATA:  Right knee pain EXAM: CHEST  1 VIEW COMPARISON:  June 18, 2019 FINDINGS: There is mild cardiomegaly. Aortic knob calcifications are seen. Both lungs are clear. No acute osseous abnormality. Partially visualized left shoulder arthroplasty is noted. IMPRESSION:  No active disease. Electronically Signed   By: Prudencio Pair M.D.   On: 01/03/2020 21:58   DG Knee 1-2 Views Right  Result Date: 01/03/2020 CLINICAL DATA:  Proximal right knee pain EXAM: RIGHT KNEE - 1-2 VIEW COMPARISON:  None. FINDINGS: The patient is status post right total knee arthroplasty. There is a mildly displaced spiral distal femoral fracture which extends to the superior femoral prosthesis. The tibial component appears to be intact. Overlying soft tissue swelling is seen. IMPRESSION: Mildly displaced spiral fracture of the distal femur with extension to the superior femoral prosthesis. Electronically Signed   By: Prudencio Pair M.D.   On: 01/03/2020 21:58   DG FEMUR, MIN 2 VIEWS RIGHT  Result Date: 01/04/2020 CLINICAL DATA:  Periprosthetic fracture. EXAM: RIGHT FEMUR 2 VIEWS COMPARISON:  Right knee and hip radiographs 01/03/2020 FINDINGS: Sequelae of right total hip arthroplasty and right total knee arthroplasty are again identified. There is no dislocation. A mildly displaced spiral fracture of the distal femoral shaft is unchanged from yesterday's knee radiographs and is again noted to extend to the superior aspect of the femoral prosthesis at the knee. Atherosclerotic vascular calcifications are noted. IMPRESSION: Unchanged periprosthetic distal femur fracture. Electronically Signed   By: Logan Bores M.D.   On: 01/04/2020 08:05   DG Hips Bilat W or Wo Pelvis 3-4 Views  Result Date: 01/03/2020 CLINICAL DATA:  Fall right knee and hip pain EXAM: DG HIP (WITH OR WITHOUT PELVIS) 3-4V BILAT COMPARISON:  None. FINDINGS: The patient is status post bilateral total hip arthroplasties. No definite periprosthetic lucency or fracture is seen. Scattered vascular calcifications are seen. Degenerative changes in the lower lumbar spine and bilateral sacroiliac joints. IMPRESSION: Status post bilateral total hip arthroplasty. No definite periprosthetic lucency or fracture. If there is high clinical suspicion  for occult hip fracture or the patient refuses to weightbear, consider further evaluation with cross-sectional imaging. For for Electronically Signed   By: Prudencio Pair M.D.   On: 01/03/2020 21:56        Scheduled Meds: . atorvastatin  20 mg Oral Daily  . busPIRone  15 mg Oral Daily  . losartan  50 mg Oral Daily   And  . hydrochlorothiazide  12.5 mg Oral Daily  . melatonin  18 mg Oral QHS  . metoprolol tartrate  25 mg Oral BID  . pantoprazole  40 mg Oral Daily   Continuous Infusions:   LOS: 1 day    Time spent: 38 minutes spent on chart review, discussion with nursing staff, consultants, updating family and interview/physical exam; more than 50% of that time was spent in counseling and/or coordination of care.    Eric J [place] DO [facility] Available via Epic secure chat 7am-7pm After these hours, please refer to coverage provider listed on [email or web address] 01/04/2020, 1:23 PM

## 2020-01-04 NOTE — Anesthesia Procedure Notes (Signed)
Procedure Name: Intubation Date/Time: 01/04/2020 5:00 PM Performed by: Shirlyn Goltz, CRNA Pre-anesthesia Checklist: Emergency Drugs available, Patient identified, Suction available and Patient being monitored Patient Re-evaluated:Patient Re-evaluated prior to induction Oxygen Delivery Method: Circle system utilized Preoxygenation: Pre-oxygenation with 100% oxygen Induction Type: IV induction Ventilation: Mask ventilation without difficulty Laryngoscope Size: Miller and 2 Grade View: Grade II Tube type: Oral Tube size: 7.0 mm Number of attempts: 1 Airway Equipment and Method: Stylet Placement Confirmation: ETT inserted through vocal cords under direct vision,  positive ETCO2 and breath sounds checked- equal and bilateral Secured at: 22 cm Tube secured with: Tape Dental Injury: Teeth and Oropharynx as per pre-operative assessment

## 2020-01-04 NOTE — Consult Note (Signed)
Reason for Consult:Right femur fx Referring Physician: E [place]  AVIANA SHEVLIN is an 79 y.o. female.  HPI: [name] [name] was tending to some blinds at home when she lost her balance and fell to the floor. She had immediate right thigh pain and could not get up or bear weight. She was brought to the ED where x-rays showed a right femur fx and orthopedic surgery was consulted. She lives alone mostly and ambulates with a cane occasionally if she's going to be walking out of the house for quite a distance.  Past Medical History:  Diagnosis Date  . Anemia    of chronic disease  . Anxiety   . Arthritis    "psoratic arthritis; new dx" (09/11/2016)  . CAD S/P percutaneous coronary angioplasty 08/2016   mLCx 90% -> 0% (2.75 mm x 16 mm) Synergy DES PCI Ostial D1 70% (not PTCA target). Moderate ostial and mD2.  LAD tandem 50% stenoses w/ negative FFR (0.86)  . Cancer ([place])    skin cancer on leg  . Chronic lower back pain   . Complication of anesthesia 1980 and 1982   trouble waking up after breast biopsy, many surgeries since no problem  . Depression   . Dyspnea on effort - CHRONIC    partial relief of Sx post PCI of L Circumflex.  . Fibrocystic breast   . GERD (gastroesophageal reflux disease)   . Headache    "monthly" (09/11/2016)  . History of blood transfusion    "several since age 19; I've had them after most of my surgeries" (09/11/2016)  . History of myocardial infarction    "doctor said there are signs I've had a heart attack; don't know when"  - Myoview [name] 2018 indicated small Inferior - Inferoseptal Infarct (vs. rest ischemia). Echo with inferoseptal hypokinesis.  Marland Kitchen Hx of skin cancer, basal cell 2013 and 2012   right inner, lower leg; several scattered around my body  . Hypertension   . Iron deficiency anemia    "I've had an iron infusion"  . Osteoarthritis   . Osteopenia   . Peptic ulcer disease    H pylori  . PMR (polymyalgia rheumatica) (HCC)   . PONV (postoperative nausea and  vomiting)   . Psoriasis     Past Surgical History:  Procedure Laterality Date  . APPENDECTOMY    . BACK SURGERY     x 6   . BREAST BIOPSY Left 1980 and 1982   "benign"  . CARDIAC CATHETERIZATION N/A 09/11/2016   Procedure: Left Heart Cath and Coronary Angiography;  Surgeon: Leonie Man, MD;  Location: [facility];  Service: Cardiovascular: mLCx 90% * (PCI). Ostial D1 70% (not PTCA target). Mod ostial and mD2.  LAD tandem 50% stenoses w/ negative FFR (0.86)  . CARDIAC CATHETERIZATION N/A 09/11/2016   Procedure: Intravascular Pressure Wire/FFR Study;  Surgeon: Leonie Man, MD;  Location: [facility];  Service: Cardiovascular: --  LAD tandem 50% stenoses w/ negative FFR (0.86  . CARDIAC CATHETERIZATION N/A 09/11/2016   Procedure: Coronary Stent Intervention;  Surgeon: Leonie Man, MD;  Location: [facility];  Service: Cardiovascular: -- DES PCI mLCx 90%--0% SYNERGY DES 2.75 MM X 16 MM.  Marland Kitchen CARDIAC CATHETERIZATION  2001   non-obstructive CAD  . CATARACT EXTRACTION W/ INTRAOCULAR LENS  IMPLANT, BILATERAL  July -  August 2017  . DILATION AND CURETTAGE OF UTERUS  X 3  . ESOPHAGOGASTRODUODENOSCOPY  03/2009   ulcer,HP, GERD stricture  .  EXCISIONAL HEMORRHOIDECTOMY    . HIP ARTHROPLASTY Left 2010  . JOINT REPLACEMENT    . KNEE ARTHROSCOPY Bilateral   . LAPAROSCOPIC CHOLECYSTECTOMY  2005  . LUMBAR DISC SURGERY  11/1984; 1987; 1989; 2004; 2005 X 2   deg. disk lumber spine   . MOHS SURGERY Right    "inside my lower leg"  . NASAL SINUS SURGERY  1970s  . NM MYOVIEW LTD  08/28/2016   INTERMEDIATE RISK (b/c reduced EF & ? small infarct) -- EF ~35-40%.  ? Prior Small Inferior-apical & septal infarct (w/o ischemia).  Difuse HK - worse in inferoapex.  Suggest prior infarct with no ischemia. Ischemic area correlates with echo WMA  . REVERSE SHOULDER ARTHROPLASTY Left 12/17/2017   Procedure: LEFT REVERSE SHOULDER ARTHROPLASTY;  Surgeon: Netta Cedars, MD;  Location: [facility];   Service: Orthopedics;  Laterality: Left;  . TOTAL HIP ARTHROPLASTY Right 04/04/2013   Procedure: RIGHT TOTAL HIP ARTHROPLASTY ANTERIOR APPROACH;  Surgeon: Mauri Pole, MD;  Location: [facility];  Service: Orthopedics;  Laterality: Right;  . TOTAL KNEE ARTHROPLASTY Right 2009  . TRANSTHORACIC ECHOCARDIOGRAM  08/2016   mild conc LVH. EF 45-50% - anteroseptal & inferoseptal HK. Mod AI.  Marland Kitchen TRANSTHORACIC ECHOCARDIOGRAM  08/2017   (post PCI) --> EF normalized:  Normal LV systolic function; mild diastolic dysfunction; mild   LVH; sclerotic aortic valve with mild AI.  No RWMA  . TUBAL LIGATION    . VAGINAL HYSTERECTOMY  1970s   partial     Family History  Problem Relation Age of Onset  . Arthritis Mother   . Emphysema Mother   . Hyperlipidemia Mother   . Hypertension Mother   . Heart disease Mother   . Heart disease Brother        CAD  . Diabetes Paternal Grandmother   . Heart disease Maternal Aunt     Social History:  reports that she has never smoked. She has never used smokeless tobacco. She reports current alcohol use. She reports that she does not use drugs.  Allergies:  Allergies  Allergen Reactions  . 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 Ace Inhibitors     cough  . Oxaprozin     GI upset   . Pregabalin Swelling    SWELLING REACTION UNSPECIFIED   . Amoxicillin-Pot Clavulanate Nausea And Vomiting    gi upset Has patient had a PCN reaction causing immediate rash, facial/tongue/throat swelling, SOB or lightheadedness with hypotension: No Has patient had a PCN reaction causing severe rash involving mucus membranes or skin necrosis: No Has patient had a PCN reaction that required hospitalization No Has patient had a PCN reaction occurring within the last 10 years: No If all of the above answers are "NO", then may proceed with Cephalosporin use.     Medications: I have reviewed the patient's current medications.  Results for orders  placed or performed during the hospital encounter of 01/03/20 (from the past 48 hour(s))  Basic metabolic panel     Status: Abnormal   Collection Time: 01/03/20 10:04 PM  Result Value Ref Range   Sodium 141 135 - 145 mmol/L   Potassium 4.6 3.5 - 5.1 mmol/L   Chloride 111 98 - 111 mmol/L   CO2 22 22 - 32 mmol/L   Glucose, Bld 102 (H) 70 - 99 mg/dL    Comment: Glucose reference range applies only to samples taken after fasting for at least 8 hours.  BUN 32 (H) 8 - 23 mg/dL   Creatinine, Ser 1.16 (H) 0.44 - 1.00 mg/dL   Calcium 9.1 8.9 - 10.3 mg/dL   GFR calc non Af Amer 44 (L) >60 mL/min   GFR calc Af Amer 51 (L) >60 mL/min   Anion gap 8 5 - 15    Comment: Performed at [facility] [address], [place], [place] [address]  CBC with Differential     Status: Abnormal   Collection Time: 01/03/20 10:04 PM  Result Value Ref Range   WBC 7.3 4.0 - 10.5 K/uL   RBC 3.92 3.87 - 5.11 MIL/uL   Hemoglobin 10.9 (L) 12.0 - 15.0 g/dL   HCT 36.7 36.0 - 46.0 %   MCV 93.6 80.0 - 100.0 fL   MCH 27.8 26.0 - 34.0 pg   MCHC 29.7 (L) 30.0 - 36.0 g/dL   RDW 14.0 11.5 - 15.5 %   Platelets 174 150 - 400 K/uL   nRBC 0.0 0.0 - 0.2 %   Neutrophils Relative % 60 %   Neutro Abs 4.5 1.7 - 7.7 K/uL   Lymphocytes Relative 31 %   Lymphs Abs 2.2 0.7 - 4.0 K/uL   Monocytes Relative 7 %   Monocytes Absolute 0.5 0.1 - 1.0 K/uL   Eosinophils Relative 1 %   Eosinophils Absolute 0.1 0.0 - 0.5 K/uL   Basophils Relative 1 %   Basophils Absolute 0.1 0.0 - 0.1 K/uL   Immature Granulocytes 0 %   Abs Immature Granulocytes 0.03 0.00 - 0.07 K/uL    Comment: Performed at [facility] [address] [place], [place] [address]  SARS Coronavirus 2 by RT PCR (hospital order, performed in [facility] hospital lab) Nasopharyngeal Nasopharyngeal Swab     Status: None   Collection Time: 01/03/20 10:13 PM   Specimen: Nasopharyngeal Swab  Result Value Ref Range   SARS Coronavirus 2 NEGATIVE NEGATIVE    Comment:  (NOTE) SARS-CoV-2 target nucleic acids are NOT DETECTED. The SARS-CoV-2 RNA is generally detectable in upper and lower respiratory specimens during the acute phase of infection. The lowest concentration of SARS-CoV-2 viral copies this assay can detect is 250 copies / mL. A negative result does not preclude SARS-CoV-2 infection and should not be used as the sole basis for treatment or other patient management decisions.  A negative result may occur with improper specimen collection / handling, submission of specimen other than nasopharyngeal swab, presence of viral mutation(s) within the areas targeted by this assay, and inadequate number of viral copies (<250 copies / mL). A negative result must be combined with clinical observations, patient history, and epidemiological information. Fact Sheet for Patients:   StrictlyIdeas.no Fact Sheet for Healthcare Providers: BankingDealers.co.za This test is not yet approved or cleared  by the [place] FDA and has been authorized for detection and/or diagnosis of SARS-CoV-2 by FDA under an Emergency Use Authorization (EUA).  This EUA will remain in effect (meaning this test can be used) for the duration of the COVID-19 declaration under Section 564(b)(1) of the Act, 21 U.S.C. section 360bbb-3(b)(1), unless the authorization is terminated or revoked sooner. Performed at [facility], [place] [address], [place], [place] [address]     DG Chest 1 View  Result Date: 01/03/2020 CLINICAL DATA:  Right knee pain EXAM: CHEST  1 VIEW COMPARISON:  June 18, 2019 FINDINGS: There is mild cardiomegaly. Aortic knob calcifications are seen. Both lungs are clear. No acute osseous abnormality. Partially  visualized left shoulder arthroplasty is noted. IMPRESSION: No active disease. Electronically Signed   By: Prudencio Pair M.D.   On: 01/03/2020 21:58   DG Knee 1-2 Views Right  Result Date: 01/03/2020 CLINICAL  DATA:  Proximal right knee pain EXAM: RIGHT KNEE - 1-2 VIEW COMPARISON:  None. FINDINGS: The patient is status post right total knee arthroplasty. There is a mildly displaced spiral distal femoral fracture which extends to the superior femoral prosthesis. The tibial component appears to be intact. Overlying soft tissue swelling is seen. IMPRESSION: Mildly displaced spiral fracture of the distal femur with extension to the superior femoral prosthesis. Electronically Signed   By: Prudencio Pair M.D.   On: 01/03/2020 21:58   DG FEMUR, MIN 2 VIEWS RIGHT  Result Date: 01/04/2020 CLINICAL DATA:  Periprosthetic fracture. EXAM: RIGHT FEMUR 2 VIEWS COMPARISON:  Right knee and hip radiographs 01/03/2020 FINDINGS: Sequelae of right total hip arthroplasty and right total knee arthroplasty are again identified. There is no dislocation. A mildly displaced spiral fracture of the distal femoral shaft is unchanged from yesterday's knee radiographs and is again noted to extend to the superior aspect of the femoral prosthesis at the knee. Atherosclerotic vascular calcifications are noted. IMPRESSION: Unchanged periprosthetic distal femur fracture. Electronically Signed   By: Logan Bores M.D.   On: 01/04/2020 08:05   DG Hips Bilat W or Wo Pelvis 3-4 Views  Result Date: 01/03/2020 CLINICAL DATA:  Fall right knee and hip pain EXAM: DG HIP (WITH OR WITHOUT PELVIS) 3-4V BILAT COMPARISON:  None. FINDINGS: The patient is status post bilateral total hip arthroplasties. No definite periprosthetic lucency or fracture is seen. Scattered vascular calcifications are seen. Degenerative changes in the lower lumbar spine and bilateral sacroiliac joints. IMPRESSION: Status post bilateral total hip arthroplasty. No definite periprosthetic lucency or fracture. If there is high clinical suspicion for occult hip fracture or the patient refuses to weightbear, consider further evaluation with cross-sectional imaging. For for Electronically Signed    By: Prudencio Pair M.D.   On: 01/03/2020 21:56    Review of Systems  HENT: Negative for ear discharge, ear pain, hearing loss and tinnitus.   Eyes: Negative for photophobia and pain.  Respiratory: Negative for cough and shortness of breath.   Cardiovascular: Negative for chest pain.  Gastrointestinal: Negative for abdominal pain, nausea and vomiting.  Genitourinary: Negative for dysuria, flank pain, frequency and urgency.  Musculoskeletal: Positive for arthralgias (Right thigh). Negative for back pain, myalgias and neck pain.  Neurological: Negative for dizziness and headaches.  Hematological: Does not bruise/bleed easily.  Psychiatric/Behavioral: The patient is not nervous/anxious.    Blood pressure (!) 152/82, pulse 84, temperature (!) 97.4 F (36.3 C), temperature source Oral, resp. rate 14, height 5\' 3"  (1.6 m), weight 79.4 kg, SpO2 99 %. Physical Exam  Constitutional: She appears well-developed and well-nourished. No distress.  HENT:  Head: Normocephalic and atraumatic.  Eyes: Conjunctivae are normal. Right eye exhibits no discharge. Left eye exhibits no discharge. No scleral icterus.  Cardiovascular: Normal rate and regular rhythm.  Respiratory: Effort normal. No respiratory distress.  Musculoskeletal:     Cervical back: Normal range of motion.     Comments: RLE No traumatic wounds, ecchymosis, or rash  Mod TTP thigh  No knee or ankle effusion  Sens DPN, SPN, TN intact  Motor EHL, ext, flex, evers 5/5  DP 1+, PT 0, No significant edema  Neurological: She is alert.  Skin: Skin is warm and dry. She is not diaphoretic.  Psychiatric: She has a normal mood and affect. Her behavior is normal.    Assessment/Plan: Right femur fx -- Plan ORIF this afternoon by Dr. Lyla Glassing. Please keep NPO. Multiple medical problems including CAD s/p PCI, 2019 echo showed preserved EF and mild AR, HTN, and anemia of chronic dz -- per primary service    Lisette Abu, PA-C Orthopedic  Surgery [phone] 01/04/2020, 9:00 AM

## 2020-01-04 NOTE — Progress Notes (Signed)
Orthopedic Tech Progress Note Patient Details:  [name] [name] 1941/04/20 [id number]  Ortho Devices Type of Ortho Device: Knee Immobilizer Ortho Device/Splint Location: RLE Ortho Device/Splint Interventions: Application   Post Interventions Patient Tolerated: Poor, Fair Instructions Provided: Care of device   Janit Pagan 01/04/2020, 10:48 AM

## 2020-01-04 NOTE — H&P (View-Only) (Signed)
Reason for Consult:Right femur fx Referring Physician: E [place]  [name] [name] is an 79 y.o. female.  HPI: [name] [name] was tending to some blinds at home when she lost her balance and fell to the floor. She had immediate right thigh pain and could not get up or bear weight. She was brought to the ED where x-rays showed a right femur fx and orthopedic surgery was consulted. She lives alone mostly and ambulates with a cane occasionally if she's going to be walking out of the house for quite a distance.  Past Medical History:  Diagnosis Date  . Anemia    of chronic disease  . Anxiety   . Arthritis    "psoratic arthritis; new dx" (09/11/2016)  . CAD S/P percutaneous coronary angioplasty 08/2016   mLCx 90% -> 0% (2.75 mm x 16 mm) Synergy DES PCI Ostial D1 70% (not PTCA target). Moderate ostial and mD2.  LAD tandem 50% stenoses w/ negative FFR (0.86)  . Cancer ([place])    skin cancer on leg  . Chronic lower back pain   . Complication of anesthesia 1980 and 1982   trouble waking up after breast biopsy, many surgeries since no problem  . Depression   . Dyspnea on effort - CHRONIC    partial relief of Sx post PCI of L Circumflex.  . Fibrocystic breast   . GERD (gastroesophageal reflux disease)   . Headache    "monthly" (09/11/2016)  . History of blood transfusion    "several since age 12; I've had them after most of my surgeries" (09/11/2016)  . History of myocardial infarction    "doctor said there are signs I've had a heart attack; don't know when"  - Myoview [name] 2018 indicated small Inferior - Inferoseptal Infarct (vs. rest ischemia). Echo with inferoseptal hypokinesis.  Marland Kitchen Hx of skin cancer, basal cell 2013 and 2012   right inner, lower leg; several scattered around my body  . Hypertension   . Iron deficiency anemia    "I've had an iron infusion"  . Osteoarthritis   . Osteopenia   . Peptic ulcer disease    H pylori  . PMR (polymyalgia rheumatica) (HCC)   . PONV (postoperative nausea and  vomiting)   . Psoriasis     Past Surgical History:  Procedure Laterality Date  . APPENDECTOMY    . BACK SURGERY     x 6   . BREAST BIOPSY Left 1980 and 1982   "benign"  . CARDIAC CATHETERIZATION N/A 09/11/2016   Procedure: Left Heart Cath and Coronary Angiography;  Surgeon: Leonie Man, MD;  Location: [facility];  Service: Cardiovascular: mLCx 90% * (PCI). Ostial D1 70% (not PTCA target). Mod ostial and mD2.  LAD tandem 50% stenoses w/ negative FFR (0.86)  . CARDIAC CATHETERIZATION N/A 09/11/2016   Procedure: Intravascular Pressure Wire/FFR Study;  Surgeon: Leonie Man, MD;  Location: [facility];  Service: Cardiovascular: --  LAD tandem 50% stenoses w/ negative FFR (0.86  . CARDIAC CATHETERIZATION N/A 09/11/2016   Procedure: Coronary Stent Intervention;  Surgeon: Leonie Man, MD;  Location: [facility];  Service: Cardiovascular: -- DES PCI mLCx 90%--0% SYNERGY DES 2.75 MM X 16 MM.  Marland Kitchen CARDIAC CATHETERIZATION  2001   non-obstructive CAD  . CATARACT EXTRACTION W/ INTRAOCULAR LENS  IMPLANT, BILATERAL  July -  August 2017  . DILATION AND CURETTAGE OF UTERUS  X 3  . ESOPHAGOGASTRODUODENOSCOPY  03/2009   ulcer,HP, GERD stricture  .  EXCISIONAL HEMORRHOIDECTOMY    . HIP ARTHROPLASTY Left 2010  . JOINT REPLACEMENT    . KNEE ARTHROSCOPY Bilateral   . LAPAROSCOPIC CHOLECYSTECTOMY  2005  . LUMBAR DISC SURGERY  11/1984; 1987; 1989; 2004; 2005 X 2   deg. disk lumber spine   . MOHS SURGERY Right    "inside my lower leg"  . NASAL SINUS SURGERY  1970s  . NM MYOVIEW LTD  08/28/2016   INTERMEDIATE RISK (b/c reduced EF & ? small infarct) -- EF ~35-40%.  ? Prior Small Inferior-apical & septal infarct (w/o ischemia).  Difuse HK - worse in inferoapex.  Suggest prior infarct with no ischemia. Ischemic area correlates with echo WMA  . REVERSE SHOULDER ARTHROPLASTY Left 12/17/2017   Procedure: LEFT REVERSE SHOULDER ARTHROPLASTY;  Surgeon: Netta Cedars, MD;  Location: [facility];   Service: Orthopedics;  Laterality: Left;  . TOTAL HIP ARTHROPLASTY Right 04/04/2013   Procedure: RIGHT TOTAL HIP ARTHROPLASTY ANTERIOR APPROACH;  Surgeon: Mauri Pole, MD;  Location: [facility];  Service: Orthopedics;  Laterality: Right;  . TOTAL KNEE ARTHROPLASTY Right 2009  . TRANSTHORACIC ECHOCARDIOGRAM  08/2016   mild conc LVH. EF 45-50% - anteroseptal & inferoseptal HK. Mod AI.  Marland Kitchen TRANSTHORACIC ECHOCARDIOGRAM  08/2017   (post PCI) --> EF normalized:  Normal LV systolic function; mild diastolic dysfunction; mild   LVH; sclerotic aortic valve with mild AI.  No RWMA  . TUBAL LIGATION    . VAGINAL HYSTERECTOMY  1970s   partial     Family History  Problem Relation Age of Onset  . Arthritis Mother   . Emphysema Mother   . Hyperlipidemia Mother   . Hypertension Mother   . Heart disease Mother   . Heart disease Brother        CAD  . Diabetes Paternal Grandmother   . Heart disease Maternal Aunt     Social History:  reports that she has never smoked. She has never used smokeless tobacco. She reports current alcohol use. She reports that she does not use drugs.  Allergies:  Allergies  Allergen Reactions  . 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 Ace Inhibitors     cough  . Oxaprozin     GI upset   . Pregabalin Swelling    SWELLING REACTION UNSPECIFIED   . Amoxicillin-Pot Clavulanate Nausea And Vomiting    gi upset Has patient had a PCN reaction causing immediate rash, facial/tongue/throat swelling, SOB or lightheadedness with hypotension: No Has patient had a PCN reaction causing severe rash involving mucus membranes or skin necrosis: No Has patient had a PCN reaction that required hospitalization No Has patient had a PCN reaction occurring within the last 10 years: No If all of the above answers are "NO", then may proceed with Cephalosporin use.     Medications: I have reviewed the patient's current medications.  Results for orders  placed or performed during the hospital encounter of 01/03/20 (from the past 48 hour(s))  Basic metabolic panel     Status: Abnormal   Collection Time: 01/03/20 10:04 PM  Result Value Ref Range   Sodium 141 135 - 145 mmol/L   Potassium 4.6 3.5 - 5.1 mmol/L   Chloride 111 98 - 111 mmol/L   CO2 22 22 - 32 mmol/L   Glucose, Bld 102 (H) 70 - 99 mg/dL    Comment: Glucose reference range applies only to samples taken after fasting for at least 8 hours.  BUN 32 (H) 8 - 23 mg/dL   Creatinine, Ser 1.16 (H) 0.44 - 1.00 mg/dL   Calcium 9.1 8.9 - 10.3 mg/dL   GFR calc non Af Amer 44 (L) >60 mL/min   GFR calc Af Amer 51 (L) >60 mL/min   Anion gap 8 5 - 15    Comment: Performed at [facility] [address], [place], [place] [address]  CBC with Differential     Status: Abnormal   Collection Time: 01/03/20 10:04 PM  Result Value Ref Range   WBC 7.3 4.0 - 10.5 K/uL   RBC 3.92 3.87 - 5.11 MIL/uL   Hemoglobin 10.9 (L) 12.0 - 15.0 g/dL   HCT 36.7 36.0 - 46.0 %   MCV 93.6 80.0 - 100.0 fL   MCH 27.8 26.0 - 34.0 pg   MCHC 29.7 (L) 30.0 - 36.0 g/dL   RDW 14.0 11.5 - 15.5 %   Platelets 174 150 - 400 K/uL   nRBC 0.0 0.0 - 0.2 %   Neutrophils Relative % 60 %   Neutro Abs 4.5 1.7 - 7.7 K/uL   Lymphocytes Relative 31 %   Lymphs Abs 2.2 0.7 - 4.0 K/uL   Monocytes Relative 7 %   Monocytes Absolute 0.5 0.1 - 1.0 K/uL   Eosinophils Relative 1 %   Eosinophils Absolute 0.1 0.0 - 0.5 K/uL   Basophils Relative 1 %   Basophils Absolute 0.1 0.0 - 0.1 K/uL   Immature Granulocytes 0 %   Abs Immature Granulocytes 0.03 0.00 - 0.07 K/uL    Comment: Performed at [facility] [address] [place], [place] [address]  SARS Coronavirus 2 by RT PCR (hospital order, performed in [facility] hospital lab) Nasopharyngeal Nasopharyngeal Swab     Status: None   Collection Time: 01/03/20 10:13 PM   Specimen: Nasopharyngeal Swab  Result Value Ref Range   SARS Coronavirus 2 NEGATIVE NEGATIVE    Comment:  (NOTE) SARS-CoV-2 target nucleic acids are NOT DETECTED. The SARS-CoV-2 RNA is generally detectable in upper and lower respiratory specimens during the acute phase of infection. The lowest concentration of SARS-CoV-2 viral copies this assay can detect is 250 copies / mL. A negative result does not preclude SARS-CoV-2 infection and should not be used as the sole basis for treatment or other patient management decisions.  A negative result may occur with improper specimen collection / handling, submission of specimen other than nasopharyngeal swab, presence of viral mutation(s) within the areas targeted by this assay, and inadequate number of viral copies (<250 copies / mL). A negative result must be combined with clinical observations, patient history, and epidemiological information. Fact Sheet for Patients:   StrictlyIdeas.no Fact Sheet for Healthcare Providers: BankingDealers.co.za This test is not yet approved or cleared  by the [place] FDA and has been authorized for detection and/or diagnosis of SARS-CoV-2 by FDA under an Emergency Use Authorization (EUA).  This EUA will remain in effect (meaning this test can be used) for the duration of the COVID-19 declaration under Section 564(b)(1) of the Act, 21 U.S.C. section 360bbb-3(b)(1), unless the authorization is terminated or revoked sooner. Performed at [facility], [place] [address], [place], [place] [address]     DG Chest 1 View  Result Date: 01/03/2020 CLINICAL DATA:  Right knee pain EXAM: CHEST  1 VIEW COMPARISON:  June 18, 2019 FINDINGS: There is mild cardiomegaly. Aortic knob calcifications are seen. Both lungs are clear. No acute osseous abnormality. Partially  visualized left shoulder arthroplasty is noted. IMPRESSION: No active disease. Electronically Signed   By: Prudencio Pair M.D.   On: 01/03/2020 21:58   DG Knee 1-2 Views Right  Result Date: 01/03/2020 CLINICAL  DATA:  Proximal right knee pain EXAM: RIGHT KNEE - 1-2 VIEW COMPARISON:  None. FINDINGS: The patient is status post right total knee arthroplasty. There is a mildly displaced spiral distal femoral fracture which extends to the superior femoral prosthesis. The tibial component appears to be intact. Overlying soft tissue swelling is seen. IMPRESSION: Mildly displaced spiral fracture of the distal femur with extension to the superior femoral prosthesis. Electronically Signed   By: Prudencio Pair M.D.   On: 01/03/2020 21:58   DG FEMUR, MIN 2 VIEWS RIGHT  Result Date: 01/04/2020 CLINICAL DATA:  Periprosthetic fracture. EXAM: RIGHT FEMUR 2 VIEWS COMPARISON:  Right knee and hip radiographs 01/03/2020 FINDINGS: Sequelae of right total hip arthroplasty and right total knee arthroplasty are again identified. There is no dislocation. A mildly displaced spiral fracture of the distal femoral shaft is unchanged from yesterday's knee radiographs and is again noted to extend to the superior aspect of the femoral prosthesis at the knee. Atherosclerotic vascular calcifications are noted. IMPRESSION: Unchanged periprosthetic distal femur fracture. Electronically Signed   By: Logan Bores M.D.   On: 01/04/2020 08:05   DG Hips Bilat W or Wo Pelvis 3-4 Views  Result Date: 01/03/2020 CLINICAL DATA:  Fall right knee and hip pain EXAM: DG HIP (WITH OR WITHOUT PELVIS) 3-4V BILAT COMPARISON:  None. FINDINGS: The patient is status post bilateral total hip arthroplasties. No definite periprosthetic lucency or fracture is seen. Scattered vascular calcifications are seen. Degenerative changes in the lower lumbar spine and bilateral sacroiliac joints. IMPRESSION: Status post bilateral total hip arthroplasty. No definite periprosthetic lucency or fracture. If there is high clinical suspicion for occult hip fracture or the patient refuses to weightbear, consider further evaluation with cross-sectional imaging. For for Electronically Signed    By: Prudencio Pair M.D.   On: 01/03/2020 21:56    Review of Systems  HENT: Negative for ear discharge, ear pain, hearing loss and tinnitus.   Eyes: Negative for photophobia and pain.  Respiratory: Negative for cough and shortness of breath.   Cardiovascular: Negative for chest pain.  Gastrointestinal: Negative for abdominal pain, nausea and vomiting.  Genitourinary: Negative for dysuria, flank pain, frequency and urgency.  Musculoskeletal: Positive for arthralgias (Right thigh). Negative for back pain, myalgias and neck pain.  Neurological: Negative for dizziness and headaches.  Hematological: Does not bruise/bleed easily.  Psychiatric/Behavioral: The patient is not nervous/anxious.    Blood pressure (!) 152/82, pulse 84, temperature (!) 97.4 F (36.3 C), temperature source Oral, resp. rate 14, height 5\' 3"  (1.6 m), weight 79.4 kg, SpO2 99 %. Physical Exam  Constitutional: She appears well-developed and well-nourished. No distress.  HENT:  Head: Normocephalic and atraumatic.  Eyes: Conjunctivae are normal. Right eye exhibits no discharge. Left eye exhibits no discharge. No scleral icterus.  Cardiovascular: Normal rate and regular rhythm.  Respiratory: Effort normal. No respiratory distress.  Musculoskeletal:     Cervical back: Normal range of motion.     Comments: RLE No traumatic wounds, ecchymosis, or rash  Mod TTP thigh  No knee or ankle effusion  Sens DPN, SPN, TN intact  Motor EHL, ext, flex, evers 5/5  DP 1+, PT 0, No significant edema  Neurological: She is alert.  Skin: Skin is warm and dry. She is not diaphoretic.  Psychiatric: She has a normal mood and affect. Her behavior is normal.    Assessment/Plan: Right femur fx -- Plan ORIF this afternoon by Dr. Lyla Glassing. Please keep NPO. Multiple medical problems including CAD s/p PCI, 2019 echo showed preserved EF and mild AR, HTN, and anemia of chronic dz -- per primary service    Lisette Abu, PA-C Orthopedic  Surgery [phone] 01/04/2020, 9:00 AM

## 2020-01-04 NOTE — H&P (Signed)
History and Physical    [name] [name] WIO:[id number] DOB: [birth year] DOA: 01/03/2020  PCP: Abner Greenspan, MD  Patient coming from: Home  I have personally briefly reviewed patient's old medical records in [place]  Chief Complaint: Fall, knee pain  HPI: [name] [name] is a 79 y.o. female with medical history significant of CAD s/p PCI, 2019 echo showed preserved EF and mild AR, HTN, anemia of chronic dz.  Pt presents to ED today after a mechanical fall at home while adjusting her blinds.  Fell injuring R knee.  Severe R knee pain, worse with any movement.  R knee swelling.   ED Course: R distal femur fx.  Spiral fx.  Dr. Onnie Graham called by EDP and ortho will see in AM.  Hospitalist asked to admit.   Review of Systems: As per HPI, otherwise all review of systems negative.  Past Medical History:  Diagnosis Date  . Anemia    of chronic disease  . Anxiety   . Arthritis    "psoratic arthritis; new dx" (09/11/2016)  . CAD S/P percutaneous coronary angioplasty 08/2016   mLCx 90% -> 0% (2.75 mm x 16 mm) Synergy DES PCI Ostial D1 70% (not PTCA target). Moderate ostial and mD2.  LAD tandem 50% stenoses w/ negative FFR (0.86)  . Cancer ([place])    skin cancer on leg  . Chronic lower back pain   . Complication of anesthesia 1980 and 1982   trouble waking up after breast biopsy, many surgeries since no problem  . Depression   . Dyspnea on effort - CHRONIC    partial relief of Sx post PCI of L Circumflex.  . Fibrocystic breast   . GERD (gastroesophageal reflux disease)   . Headache    "monthly" (09/11/2016)  . History of blood transfusion    "several since age 50; I've had them after most of my surgeries" (09/11/2016)  . History of myocardial infarction    "doctor said there are signs I've had a heart attack; don't know when"  - Myoview [name] 2018 indicated small Inferior - Inferoseptal Infarct (vs. rest ischemia). Echo with inferoseptal hypokinesis.  Marland Kitchen Hx of skin cancer, basal  cell 2013 and 2012   right inner, lower leg; several scattered around my body  . Hypertension   . Iron deficiency anemia    "I've had an iron infusion"  . Osteoarthritis   . Osteopenia   . Peptic ulcer disease    H pylori  . PMR (polymyalgia rheumatica) (HCC)   . PONV (postoperative nausea and vomiting)   . Psoriasis     Past Surgical History:  Procedure Laterality Date  . APPENDECTOMY    . BACK SURGERY     x 6   . BREAST BIOPSY Left 1980 and 1982   "benign"  . CARDIAC CATHETERIZATION N/A 09/11/2016   Procedure: Left Heart Cath and Coronary Angiography;  Surgeon: Leonie Man, MD;  Location: [facility];  Service: Cardiovascular: mLCx 90% * (PCI). Ostial D1 70% (not PTCA target). Mod ostial and mD2.  LAD tandem 50% stenoses w/ negative FFR (0.86)  . CARDIAC CATHETERIZATION N/A 09/11/2016   Procedure: Intravascular Pressure Wire/FFR Study;  Surgeon: Leonie Man, MD;  Location: [facility];  Service: Cardiovascular: --  LAD tandem 50% stenoses w/ negative FFR (0.86  . CARDIAC CATHETERIZATION N/A 09/11/2016   Procedure: Coronary Stent Intervention;  Surgeon: Leonie Man, MD;  Location: [facility];  Service: Cardiovascular: --  DES PCI mLCx 90%--0% SYNERGY DES 2.75 MM X 16 MM.  Marland Kitchen CARDIAC CATHETERIZATION  2001   non-obstructive CAD  . CATARACT EXTRACTION W/ INTRAOCULAR LENS  IMPLANT, BILATERAL  July -  August 2017  . DILATION AND CURETTAGE OF UTERUS  X 3  . ESOPHAGOGASTRODUODENOSCOPY  03/2009   ulcer,HP, GERD stricture  . EXCISIONAL HEMORRHOIDECTOMY    . HIP ARTHROPLASTY Left 2010  . JOINT REPLACEMENT    . KNEE ARTHROSCOPY Bilateral   . LAPAROSCOPIC CHOLECYSTECTOMY  2005  . LUMBAR DISC SURGERY  11/1984; 1987; 1989; 2004; 2005 X 2   deg. disk lumber spine   . MOHS SURGERY Right    "inside my lower leg"  . NASAL SINUS SURGERY  1970s  . NM MYOVIEW LTD  08/28/2016   INTERMEDIATE RISK (b/c reduced EF & ? small infarct) -- EF ~35-40%.  ? Prior Small  Inferior-apical & septal infarct (w/o ischemia).  Difuse HK - worse in inferoapex.  Suggest prior infarct with no ischemia. Ischemic area correlates with echo WMA  . REVERSE SHOULDER ARTHROPLASTY Left 12/17/2017   Procedure: LEFT REVERSE SHOULDER ARTHROPLASTY;  Surgeon: Netta Cedars, MD;  Location: [facility];  Service: Orthopedics;  Laterality: Left;  . TOTAL HIP ARTHROPLASTY Right 04/04/2013   Procedure: RIGHT TOTAL HIP ARTHROPLASTY ANTERIOR APPROACH;  Surgeon: Mauri Pole, MD;  Location: [facility];  Service: Orthopedics;  Laterality: Right;  . TOTAL KNEE ARTHROPLASTY Right 2009  . TRANSTHORACIC ECHOCARDIOGRAM  08/2016   mild conc LVH. EF 45-50% - anteroseptal & inferoseptal HK. Mod AI.  Marland Kitchen TRANSTHORACIC ECHOCARDIOGRAM  08/2017   (post PCI) --> EF normalized:  Normal LV systolic function; mild diastolic dysfunction; mild   LVH; sclerotic aortic valve with mild AI.  No RWMA  . TUBAL LIGATION    . VAGINAL HYSTERECTOMY  1970s   partial      reports that she has never smoked. She has never used smokeless tobacco. She reports current alcohol use. She reports that she does not use drugs.  Allergies  Allergen Reactions  . 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 Ace Inhibitors     cough  . Oxaprozin     GI upset   . Pregabalin Swelling    SWELLING REACTION UNSPECIFIED   . Amoxicillin-Pot Clavulanate Nausea And Vomiting    gi upset Has patient had a PCN reaction causing immediate rash, facial/tongue/throat swelling, SOB or lightheadedness with hypotension: No Has patient had a PCN reaction causing severe rash involving mucus membranes or skin necrosis: No Has patient had a PCN reaction that required hospitalization No Has patient had a PCN reaction occurring within the last 10 years: No If all of the above answers are "NO", then may proceed with Cephalosporin use.     Family History  Problem Relation Age of Onset  . Arthritis Mother   . Emphysema  Mother   . Hyperlipidemia Mother   . Hypertension Mother   . Heart disease Mother   . Heart disease Brother        CAD  . Diabetes Paternal Grandmother   . Heart disease Maternal Aunt      Prior to Admission medications   Medication Sig Start Date End Date Taking? Authorizing Provider  acetaminophen (TYLENOL) 500 MG tablet Take 1,000 mg by mouth 2 (two) times daily as needed for moderate pain or headache.    Yes [provider]  atorvastatin (LIPITOR) 20 MG tablet TAKE 1 TABLET BY MOUTH  EVERY DAY Patient taking differently: Take 20 mg by mouth daily.  10/11/19  Yes Leonie Man, MD  busPIRone (BUSPAR) 15 MG tablet Take 1 tablet (15 mg total) by mouth daily. 11/01/19  Yes Tower, [name], MD  cetirizine (ZYRTEC) 10 MG tablet Take 1 tablet (10 mg total) by mouth daily. 06/16/19  Yes Elby Beck, FNP  clopidogrel (PLAVIX) 75 MG tablet TAKE 1 TABLET BY MOUTH EVERY DAY WITH BREAKFAST Patient taking differently: Take 75 mg by mouth daily.  11/27/19  Yes Leonie Man, MD  fluticasone [facility]) 50 MCG/ACT nasal spray Place 2 sprays into both nostrils daily. Patient taking differently: Place 2 sprays into both nostrils daily as needed for allergies.  06/16/19  Yes Elby Beck, FNP  HYDROcodone-acetaminophen (NORCO/VICODIN) 5-325 MG tablet Take 1-2 tablets by mouth every 4 (four) hours as needed for moderate pain or severe pain. 12/17/17  Yes Netta Cedars, MD  losartan-hydrochlorothiazide [facility]) 50-12.5 MG tablet Take 1 tablet by mouth daily. 12/27/19  Yes Leonie Man, MD  Melatonin 3 MG CAPS Take 18 mg by mouth at bedtime.    Yes [provider]  metoprolol tartrate (LOPRESSOR) 25 MG tablet TAKE 1 TABLET BY MOUTH TWICE A DAY Patient taking differently: Take 25 mg by mouth 2 (two) times daily.  11/22/19  Yes Leonie Man, MD  nitroGLYCERIN (NITROSTAT) 0.4 MG SL tablet Place 1 tablet (0.4 mg total) under the tongue every 5 (five) minutes as needed for chest  pain. 09/08/16 01/02/29 Yes Simmons, Brittainy M, PA-C  pantoprazole (PROTONIX) 40 MG tablet Take 1 tablet (40 mg total) by mouth daily. 05/23/19  Yes Leonie Man, MD  Vitamin D, Cholecalciferol, 25 MCG (1000 UT) CAPS Take 2,000 Units by mouth in the morning and at bedtime.    Yes [provider]    Physical Exam: Vitals:   01/03/20 1952 01/03/20 1953 01/03/20 2100 01/03/20 2353  BP: (!) 150/95  (!) 117/53 (!) 102/53  Pulse: 75  67 84  Resp: 12  17 16   Temp: (!) 97.4 F (36.3 C)     TempSrc: Oral     SpO2: 98%  99% 100%  Weight:  79.4 kg    Height:  5\' 3"  (1.6 m)      Constitutional: NAD, calm, comfortable Eyes: PERRL, lids and conjunctivae normal ENMT: Mucous membranes are moist. Posterior pharynx clear of any exudate or lesions.Normal dentition.  Neck: normal, supple, no masses, no thyromegaly Respiratory: clear to auscultation bilaterally, no wheezing, no crackles. Normal respiratory effort. No accessory muscle use.  Cardiovascular: Regular rate and rhythm, no murmurs / rubs / gallops. No extremity edema. 2+ pedal pulses. No carotid bruits.  Abdomen: no tenderness, no masses palpated. No hepatosplenomegaly. Bowel sounds positive.  Musculoskeletal: R thigh/knee TTP Skin: no rashes, lesions, ulcers. No induration Neurologic: CN 2-12 grossly intact. Sensation intact, DTR normal. Strength 5/5 in all 4.  Psychiatric: Normal judgment and insight. Alert and oriented x 3. Normal mood.    Labs on Admission: I have personally reviewed following labs and imaging studies  CBC: Recent Labs  Lab 01/03/20 2204  WBC 7.3  NEUTROABS 4.5  HGB 10.9*  HCT 36.7  MCV 93.6  PLT 852   Basic Metabolic Panel: Recent Labs  Lab 01/03/20 2204  NA 141  K 4.6  CL 111  CO2 22  GLUCOSE 102*  BUN 32*  CREATININE 1.16*  CALCIUM 9.1   GFR: Estimated Creatinine Clearance: 38.6 mL/min (A) (by C-G  formula based on SCr of 1.16 mg/dL (H)). Liver Function Tests: No results for  input(s): AST, ALT, ALKPHOS, BILITOT, PROT, ALBUMIN in the last 168 hours. No results for input(s): LIPASE, AMYLASE in the last 168 hours. No results for input(s): AMMONIA in the last 168 hours. Coagulation Profile: No results for input(s): INR, PROTIME in the last 168 hours. Cardiac Enzymes: No results for input(s): CKTOTAL, CKMB, CKMBINDEX, TROPONINI in the last 168 hours. BNP (last 3 results) No results for input(s): PROBNP in the last 8760 hours. HbA1C: No results for input(s): HGBA1C in the last 72 hours. CBG: No results for input(s): GLUCAP in the last 168 hours. Lipid Profile: No results for input(s): CHOL, HDL, LDLCALC, TRIG, CHOLHDL, LDLDIRECT in the last 72 hours. Thyroid Function Tests: No results for input(s): TSH, T4TOTAL, FREET4, T3FREE, THYROIDAB in the last 72 hours. Anemia Panel: No results for input(s): VITAMINB12, FOLATE, FERRITIN, TIBC, IRON, RETICCTPCT in the last 72 hours. Urine analysis:    Component Value Date/Time   COLORURINE YELLOW 06/18/2019 2231   APPEARANCEUR HAZY (A) 06/18/2019 2231   LABSPEC 1.018 06/18/2019 2231   LABSPEC 1.010 11/17/2013 1237   PHURINE 5.0 06/18/2019 2231   GLUCOSEU NEGATIVE 06/18/2019 2231   GLUCOSEU Negative 11/17/2013 1237   HGBUR NEGATIVE 06/18/2019 2231   HGBUR small 06/03/2007 [place] 06/18/2019 2231   BILIRUBINUR Negative 11/17/2013 [place] 06/18/2019 2231   PROTEINUR NEGATIVE 06/18/2019 2231   UROBILINOGEN 0.2 11/17/2013 1237   NITRITE NEGATIVE 06/18/2019 2231   LEUKOCYTESUR MODERATE (A) 06/18/2019 2231   LEUKOCYTESUR Trace 11/17/2013 1237    Radiological Exams on Admission: DG Chest 1 View  Result Date: 01/03/2020 CLINICAL DATA:  Right knee pain EXAM: CHEST  1 VIEW COMPARISON:  June 18, 2019 FINDINGS: There is mild cardiomegaly. Aortic knob calcifications are seen. Both lungs are clear. No acute osseous abnormality. Partially visualized left shoulder arthroplasty is noted.  IMPRESSION: No active disease. Electronically Signed   By: Prudencio Pair M.D.   On: 01/03/2020 21:58   DG Knee 1-2 Views Right  Result Date: 01/03/2020 CLINICAL DATA:  Proximal right knee pain EXAM: RIGHT KNEE - 1-2 VIEW COMPARISON:  None. FINDINGS: The patient is status post right total knee arthroplasty. There is a mildly displaced spiral distal femoral fracture which extends to the superior femoral prosthesis. The tibial component appears to be intact. Overlying soft tissue swelling is seen. IMPRESSION: Mildly displaced spiral fracture of the distal femur with extension to the superior femoral prosthesis. Electronically Signed   By: Prudencio Pair M.D.   On: 01/03/2020 21:58   DG Hips Bilat W or Wo Pelvis 3-4 Views  Result Date: 01/03/2020 CLINICAL DATA:  Fall right knee and hip pain EXAM: DG HIP (WITH OR WITHOUT PELVIS) 3-4V BILAT COMPARISON:  None. FINDINGS: The patient is status post bilateral total hip arthroplasties. No definite periprosthetic lucency or fracture is seen. Scattered vascular calcifications are seen. Degenerative changes in the lower lumbar spine and bilateral sacroiliac joints. IMPRESSION: Status post bilateral total hip arthroplasty. No definite periprosthetic lucency or fracture. If there is high clinical suspicion for occult hip fracture or the patient refuses to weightbear, consider further evaluation with cross-sectional imaging. For for Electronically Signed   By: Prudencio Pair M.D.   On: 01/03/2020 21:56    EKG: Independently reviewed.  Assessment/Plan Principal Problem:   Fracture of right femur (HCC) Active Problems:   Anemia of chronic disease   Essential hypertension   Left bundle  branch block (LBBB) on electrocardiogram   CAD S/P percutaneous coronary angioplasty   Femur fracture, right (HCC)    1. R femur fx - 1. Ortho to see in AM 2. Hip fx pathway 3. NPO 4. Will hold plavix for the moment, just SCDs for now 5. Pain control per pathway 2. CAD  - 1. Holding plavix until okay to restart with ortho 2. She just had surgical clearance for carpal tunnel surgery last month: cards recd no additional testing needed at that time and noted that plavix could be held 5-7 days pre-op (see the 3/30 telephone note). 3. HTN - cont home BP meds  DVT prophylaxis: SCDs for now Code Status: Full code - per patient Family Communication: Daughter at bedside Disposition Plan: Likely SNF / Rehab after fx repair. Consults called: Dr. Onnie Graham Admission status: Admit to inpatient  Severity of Illness: The appropriate patient status for this patient is INPATIENT. Inpatient status is judged to be reasonable and necessary in order to provide the required intensity of service to ensure the patient's safety. The patient's presenting symptoms, physical exam findings, and initial radiographic and laboratory data in the context of their chronic comorbidities is felt to place them at high risk for further clinical deterioration. Furthermore, it is not anticipated that the patient will be medically stable for discharge from the hospital within 2 midnights of admission. The following factors support the patient status of inpatient.   IP status for femur fx requiring OR repair.  * I certify that at the point of admission it is my clinical judgment that the patient will require inpatient hospital care spanning beyond 2 midnights from the point of admission due to high intensity of service, high risk for further deterioration and high frequency of surveillance required.*    Conchita Truxillo M. DO [facility]  How to contact the [facility] Attending or Consulting provider [place] or covering provider during after hours [place], for this patient?  1. Check the care team in [facility] and look for a) attending/consulting TR[facility] provider listed and b) the [facility] team listed 2. Log into [email or web address]  Amion Physician Scheduling and messaging for groups and whole hospitals  On call and  physician scheduling software for group practices, residents, hospitalists and other medical providers for call, clinic, rotation and shift schedules. OnCall [employer] is a hospital-wide system for scheduling doctors and paging doctors on call. EasyPlot is for scientific plotting and data analysis.  [email or web address]  and use [place]'s universal password to access. If you do not have the password, please contact the hospital operator.  3. Locate the [facility] provider you are looking for under [facility] and page to a number that you can be directly reached. 4. If you still have difficulty reaching the provider, please page the [facility] (Director on Call) for the Hospitalists listed on amion for assistance.  01/04/2020, 12:15 AM

## 2020-01-04 NOTE — Progress Notes (Signed)
Pt gave wedding ring to her daughter to hold until after surgery

## 2020-01-04 NOTE — Discharge Instructions (Signed)
Dr. Rod Can Adult Hip & Knee Specialist [facility] [address], [place], [place] [address] [phone]   POSTOPERATIVE DIRECTIONS    Hip Rehabilitation, Guidelines Following Surgery   WEIGHT BEARING Other:  touch down weight bearing right lower extremity with walker   [place] items at home which could result in a fall. This includes throw rugs or furniture in walking pathways.  Continue medications as instructed at time of discharge.  You may have some home medications which will be placed on hold until you complete the course of blood thinner medication.  4 days after discharge, you may start showering. No tub baths or soaking your incisions. Do not put on socks or shoes without following the instructions of your caregivers.   Sit on chairs with arms. Use the chair arms to help push yourself up when arising.  Arrange for the use of a toilet seat elevator so you are not sitting low.   Walk with walker as instructed.  You may resume a sexual relationship in one month or when given the OK by your caregiver.  Use walker as long as suggested by your caregivers.  Avoid periods of inactivity such as sitting longer than an hour when not asleep. This helps prevent blood clots.  You may return to work once you are cleared by Engineer, production.  Do not drive a car for 6 weeks or until released by your surgeon.  Do not drive while taking narcotics.  Wear elastic stockings for two weeks following surgery during the day but you may remove then at night.  Make sure you keep all of your appointments after your operation with all of your doctors and caregivers. You should call the office at the above phone number and make an appointment for approximately two weeks after the date of your surgery. Please pick up a stool softener and laxative for home use as long as you are requiring pain medications.  ICE to the affected hip every three  hours for 30 minutes at a time and then as needed for pain and swelling. Continue to use ice on the hip for pain and swelling from surgery. You may notice swelling that will progress down to the foot and ankle.  This is normal after surgery.  Elevate the leg when you are not up walking on it.   It is important for you to complete the blood thinner medication as prescribed by your doctor.  Continue to use the breathing machine which will help keep your temperature down.  It is common for your temperature to cycle up and down following surgery, especially at night when you are not up moving around and exerting yourself.  The breathing machine keeps your lungs expanded and your temperature down.  RANGE OF MOTION AND STRENGTHENING EXERCISES  These exercises are designed to help you keep full movement of your hip joint. Follow your caregiver's or physical therapist's instructions. Perform all exercises about fifteen times, three times per day or as directed. Exercise both hips, even if you have had only one joint replacement. These exercises can be done on a training (exercise) mat, on the floor, on a table or on a bed. Use whatever works the best and is most comfortable for you. Use music or television while you are exercising so that the exercises are a pleasant break in your day. This will make your life better with the exercises acting as a break in routine you can look  forward to.  Lying on your back, slowly slide your foot toward your buttocks, raising your knee up off the floor. Then slowly slide your foot back down until your leg is straight again.  Lying on your back spread your legs as far apart as you can without causing discomfort.  Lying on your side, raise your upper leg and foot straight up from the floor as far as is comfortable. Slowly lower the leg and repeat.  Lying on your back, tighten up the muscle in the front of your thigh (quadriceps muscles). You can do this by keeping your leg  straight and trying to raise your heel off the floor. This helps strengthen the largest muscle supporting your knee.  Lying on your back, tighten up the muscles of your buttocks both with the legs straight and with the knee bent at a comfortable angle while keeping your heel on the floor.   SKILLED REHAB INSTRUCTIONS: If the patient is transferred to a skilled rehab facility following release from the hospital, a list of the current medications will be sent to the facility for the patient to continue.  When discharged from the skilled rehab facility, please have the facility set up the patient's [place] prior to being released. Also, the skilled facility will be responsible for providing the patient with their medications at time of release from the facility to include their pain medication and their blood thinner medication. If the patient is still at the rehab facility at time of the two week follow up appointment, the skilled rehab facility will also need to assist the patient in arranging follow up appointment in our office and any transportation needs.  MAKE SURE YOU:  Understand these instructions.  Will watch your condition.  Will get help right away if you are not doing well or get worse.  Pick up stool softner and laxative for home use following surgery while on pain medications. Daily dry dressing changes as needed. In 4 days, you may remove your dressings and begin taking showers - no tub baths or soaking the incisions. Continue to use ice for pain and swelling after surgery. Do not use any lotions or creams on the incision until instructed by your surgeon.

## 2020-01-04 NOTE — Transfer of Care (Signed)
Immediate Anesthesia Transfer of Care Note  Patient: [name] [name]  Procedure(s) Performed: OPEN REDUCTION INTERNAL FIXATION (ORIF) DISTAL FEMUR FRACTURE (Right )  Patient Location: PACU  Anesthesia Type:General  Level of Consciousness: awake, alert , oriented and patient cooperative  Airway & Oxygen Therapy: Patient Spontanous Breathing and Patient connected to nasal cannula oxygen  Post-op Assessment: Report given to RN and Post -op Vital signs reviewed and stable  Post vital signs: Reviewed and stable  Last Vitals:  Vitals Value Taken Time  BP 179/82 01/04/20 1845  Temp    Pulse 84 01/04/20 1847  Resp 13 01/04/20 1849  SpO2 95 % 01/04/20 1847  Vitals shown include unvalidated device data.  Last Pain:  Vitals:   01/04/20 1618  TempSrc:   PainSc: 9       Patients Stated Pain Goal: 3 (32/44/01 0272)  Complications: No apparent anesthesia complications

## 2020-01-04 NOTE — Telephone Encounter (Signed)
Received call from pt stating she has been admitted to the hospital for a fall that has lead to a broken femur.  Pt states she is going to have surgery and will need to cancel upcoming lab and injection apt.  Apt canceled and RN educated pt to call the office once she returns home to re schedule apts.  Pt verbalized understanding,.

## 2020-01-04 NOTE — Interval H&P Note (Signed)
History and Physical Interval Note:  01/04/2020 4:45 PM  [name] [name]  has presented today for surgery, with the diagnosis of Right periprosthetic distal femur.  The various methods of treatment have been discussed with the patient and family. After consideration of risks, benefits and other options for treatment, the patient has consented to  Procedure(s): OPEN REDUCTION INTERNAL FIXATION (ORIF) DISTAL FEMUR FRACTURE (Right) as a surgical intervention.  The patient's history has been reviewed, patient examined, no change in status, stable for surgery.  I have reviewed the patient's chart and labs.  Questions were answered to the patient's satisfaction.    The risks, benefits, and alternatives were discussed with the patient. There are risks associated with the surgery including, but not limited to, problems with anesthesia (death), infection, differences in leg length/angulation/rotation, fracture of bones, loosening or failure of implants, malunion, nonunion, hematoma (blood accumulation) which may require surgical drainage, blood clots, pulmonary embolism, nerve injury (foot drop), and blood vessel injury. The patient understands these risks and elects to proceed.    Hilton Cork Luman Holway

## 2020-01-04 NOTE — Op Note (Signed)
OPERATIVE REPORT   01/04/2020  6:34 PM  PATIENT:  [name] [name]   SURGEON:  Bertram Savin, MD  ASSISTANT: Katha Hamming, MD   PREOPERATIVE DIAGNOSIS: Right periprosthetic distal femur fracture  POSTOPERATIVE DIAGNOSIS:  Same.  PROCEDURE: Open reduction internal fixation right femur. Interpretation of fluoroscopic images.  ANESTHESIA:   GETA.  ANTIBIOTICS: 2 g Ancef.  ESTIMATED BLOOD LOSS: 100 cc.  IMPLANTS: Zimmer NCB periprosthetic distal femur locking plate, 15 hole. 4.0 mm and 5.0 mm cortical screws. Locking caps.  SPECIMENS: None.  COMPLICATIONS: None.  DISPOSITION: Stable to PACU.  SURGICAL INDICATIONS:  [name] [name] is a 79 y.o. female who was admitted with a periprosthetic right distal femur fracture.  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  She was admitted to the hospitalist service for perioperative risk ratification and medical optimization.  Due to the unstable nature of the injury, she was indicated for surgical fixation.  The risks, benefits, and alternatives were discussed with the patient preoperatively including but not limited to the risks of infection, bleeding, nerve / blood vessel injury, malunion, nonunion, cardiopulmonary complications, the need for repeat surgery, among others, and the patient was willing to proceed.  PROCEDURE IN DETAIL: Patient was identified in the preoperative holding area using 2 identifiers.  The lower extremity was marked.  She was brought back to the operating room by our anesthesia colleagues.  She was placed under general anesthetic.  She was carefully transferred over to a radiolucent flat top table.  A bump was placed under operative hip.  All bony prominences were well-padded.  The right lower extremity was then prepped and draped in usual sterile fashion.  A timeout was performed to verify the patient, the procedure, and extremity.  Preoperative antibiotics  were dosed within 60 minutes of beginning the procedure.  Fluoroscopic imaging was obtained to show the unstable nature of her injury.  A lateral approach to the distal femur was carried down through skin and subcutaneous tissue.  The IT band was split in line with the incision.  I then performed reduction maneuver with the knee flexed over a triangle.  Once reduction with adequate I then developed the plane between the lateral portion of the femur and the vastus lateralis.  I then slid a Zimmer [employer] NCB 15 hole distal femoral locking plate along the lateral cortex.  I then provisionally held the plate in place distally with a 2.0 mm K wire.  I then made a percutaneous incision proximally and provisionally held the proximal portion of the plate with a 3.3 mm drill bit.  Once I was pleased with the alignment and the positioning of the plate, I then placed 5.0 millimeter screws distally to bring the plate flush to bone.  Then proximally I placed a 5.0 mm drill bit to bring the proximal portion of the plate flush to bone.  There was still some residual valgus, so just below the fracture in the distal segment I used a 4.0 millimeter screw to correct the coronal alignment.  I confirmed that the lateral view showed excellent alignment.  I then returned to the proximal segment and placed nonlocking screws in the femoral shaft.  I placed a unicortical screw proximal to the distal tip of the stem.  Locking caps were placed on the 5.0 millimeter screws in the femoral shaft.  The jig was then removed.  5.0 millimeter screws were placed in the distal segment.  A total of  6 screws were placed distal to the fracture.  Locking caps were placed on all of these screws.  Final fluoroscopic imaging was obtained.  The incision was copiously irrigated.  A gram of vancomycin powder was placed into the incision.  IT band was closed with #1 Vicryl.  Deep dermal layer was closed with 2-0 Monocryl.  Skin was reapproximated with  staples.  Mepilex dressings were placed.  The patient was then extubated and taken to the PACU in stable condition.  Sponge, needle, and instrument counts were correct at the end of the case x2.  There were no known complications.  POSTOPERATIVE PLAN: Postoperatively, the patient be readmitted to the hospitalist.  Touchdown weightbearing right lower extremity with a walker.  Begin Lovenox for DVT prophylaxis.  Mobilize out of bed with physical and occupational therapy.  Patient will undergo disposition planning.  Return to the office for routine 2-week postoperative follow-up.

## 2020-01-05 LAB — CBC
HCT: 26.9 % — ABNORMAL LOW (ref 36.0–46.0)
Hemoglobin: 8.1 g/dL — ABNORMAL LOW (ref 12.0–15.0)
MCH: 28 pg (ref 26.0–34.0)
MCHC: 30.1 g/dL (ref 30.0–36.0)
MCV: 93.1 fL (ref 80.0–100.0)
Platelets: 132 10*3/uL — ABNORMAL LOW (ref 150–400)
RBC: 2.89 MIL/uL — ABNORMAL LOW (ref 3.87–5.11)
RDW: 13.9 % (ref 11.5–15.5)
WBC: 7.6 10*3/uL (ref 4.0–10.5)
nRBC: 0 % (ref 0.0–0.2)

## 2020-01-05 LAB — BASIC METABOLIC PANEL
Anion gap: 8 (ref 5–15)
BUN: 26 mg/dL — ABNORMAL HIGH (ref 8–23)
CO2: 21 mmol/L — ABNORMAL LOW (ref 22–32)
Calcium: 8.4 mg/dL — ABNORMAL LOW (ref 8.9–10.3)
Chloride: 109 mmol/L (ref 98–111)
Creatinine, Ser: 1.13 mg/dL — ABNORMAL HIGH (ref 0.44–1.00)
GFR calc Af Amer: 53 mL/min — ABNORMAL LOW (ref >60)
GFR calc non Af Amer: 46 mL/min — ABNORMAL LOW (ref >60)
Glucose, Bld: 152 mg/dL — ABNORMAL HIGH (ref 70–99)
Potassium: 4.2 mmol/L (ref 3.5–5.1)
Sodium: 138 mmol/L (ref 135–145)

## 2020-01-05 LAB — MAGNESIUM: Magnesium: 1.7 mg/dL (ref 1.7–2.4)

## 2020-01-05 MED ORDER — MAGNESIUM SULFATE 2 GM/50ML IV SOLN
2.0000 g | Freq: Once | INTRAVENOUS | Status: AC
  Administered 2020-01-05: 2 g via INTRAVENOUS
  Filled 2020-01-05: qty 50

## 2020-01-05 NOTE — Progress Notes (Signed)
PROGRESS NOTE    [name] [name]  HBZ:[id number] DOB: 08-10-1941 DOA: 01/03/2020 PCP: Abner Greenspan, MD    Brief Narrative:  [name] [name] is an 79 year old female with past medical history remarkable for CAD status post PCI, HLD, essential hypertension, anemia of chronic disease, and GERD who presented to the ED following mechanical fall at home.  Patient complains of severe right knee pain worse with any type of movement associated with swelling.   In the ED, x-rays notable for right distal femur fracture.  ED physician contacted orthopedics will see in the a.m.  [facility] consulted for admission.   Assessment & Plan:   Principal Problem:   Fracture of right femur (HCC) Active Problems:   Anemia of chronic disease   Essential hypertension   Left bundle branch block (LBBB) on electrocardiogram   CAD S/P percutaneous coronary angioplasty   Femur fracture, right ([place])   Right femur fracture Patient presenting from home following mechanical fall.  Normally fairly mobile at baseline and utilizes a cane occasionally.  X-ray right knee with mildly displaced spiral fracture distal femur with extension to superior femoral prosthesis.  Patient underwent surgical intervention with ORIF on 01/04/2020 by Dr. Lyla Glassing. --Postoperative DVT prophylaxis and pain control per Ortho --PT recommends SNF; although patient hesitant to go to rehab, will discuss with family for considerations of rehab versus home health therapy.   Essential hypertension BP 130/29 this morning. --Continue metoprolol tartrate 25 mg p.o. twice daily --Continue home losartan-HCTZ perioperatively --Labetalol 10 mg IV every 4 hours as needed for SBP greater than 170 --Continue to monitor blood pressure closely  CAD s/p PCI --Continue Plavix --Continue statin  Depression --Continue buspirone 15 mg p.o. daily  HLD: Continue atorvastatin 10 mg p.o. daily  GERD: Continue PPI   DVT prophylaxis: SCDs, postoperatively per  orthopedics Code Status: Full code Family Communication: No family present at bedside this morning  Disposition Plan:  Status is: Inpatient  Remains inpatient appropriate because:Ongoing active pain requiring inpatient pain management, Unsafe d/c plan and Inpatient level of care appropriate due to severity of illness for acute complicated right femur fracture   Dispo: The patient is from: Home              Anticipated d/c is to: To be determined SNF versus home health              Anticipated d/c date is: 2 days              Patient currently is not medically stable to d/c.    Consultants:   [facility] - Dr. Lyla Glassing  Procedures:   ORIF right femur - 5/13 - Dr. Lyla Glassing  Antimicrobials:   Perioperative vancomycin and cefazolin   Subjective: Patient seen and examined at bedside, resting comfortably.  Was not aware that she had to order her meals while she was here, now she has this understanding.  Long discussion regarding her recent passing of her husband last year while watching a football game.  Patient currently wary of going to rehab due to family experiences in the past, and considering to discharge home with her daughter to continue therapy at her house.  No other complaints or concerns at this time.  Denies headache, no visual changes, no chest pain, no palpitations, no shortness of breath, no abdominal pain.  No acute events overnight per nursing staff.  Objective: Vitals:   01/04/20 2303 01/05/20 0307 01/05/20 0740 01/05/20 1253  BP: (!) 157/76 112/89 121/66 Marland Kitchen)  116/59  Pulse: 90 79 82 79  Resp: 17 16 16 16   Temp:  97.6 F (36.4 C) 97.7 F (36.5 C) 98.4 F (36.9 C)  TempSrc:  Oral Oral Oral  SpO2:  96% 98% 98%  Weight:      Height:        Intake/Output Summary (Last 24 hours) at 01/05/2020 1314 Last data filed at 01/05/2020 0600 Gross per 24 hour  Intake 1549.4 ml  Output 1225 ml  Net 324.4 ml   Filed Weights   01/03/20 1953 01/04/20 1504  Weight:  79.4 kg 79.4 kg    Examination:  General exam: Appears calm and comfortable  Respiratory system: Clear to auscultation. Respiratory effort normal. Cardiovascular system: S1 & S2 heard, RRR. No JVD, murmurs, rubs, gallops or clicks. No pedal edema. Gastrointestinal system: Abdomen is nondistended, soft and nontender. No organomegaly or masses felt. Normal bowel sounds heard. Central nervous system: Alert and oriented. No focal neurological deficits. Extremities: Tenderness to palpation right lower thigh, sensation grossly intact Skin: No rashes, lesions or ulcers Psychiatry: Judgement and insight appear normal. Mood & affect appropriate.     Data Reviewed: I have personally reviewed following labs and imaging studies  CBC: Recent Labs  Lab 01/03/20 2204 01/04/20 2003 01/05/20 0406  WBC 7.3 9.0 7.6  NEUTROABS 4.5  --   --   HGB 10.9* 9.0* 8.1*  HCT 36.7 29.4* 26.9*  MCV 93.6 93.0 93.1  PLT 174 157 284*   Basic Metabolic Panel: Recent Labs  Lab 01/03/20 2204 01/04/20 2003 01/05/20 0406  NA 141  --  138  K 4.6  --  4.2  CL 111  --  109  CO2 22  --  21*  GLUCOSE 102*  --  152*  BUN 32*  --  26*  CREATININE 1.16* 1.19* 1.13*  CALCIUM 9.1  --  8.4*  MG  --   --  1.7   GFR: Estimated Creatinine Clearance: 39.6 mL/min (A) (by C-G formula based on SCr of 1.13 mg/dL (H)). Liver Function Tests: No results for input(s): AST, ALT, ALKPHOS, BILITOT, PROT, ALBUMIN in the last 168 hours. No results for input(s): LIPASE, AMYLASE in the last 168 hours. No results for input(s): AMMONIA in the last 168 hours. Coagulation Profile: No results for input(s): INR, PROTIME in the last 168 hours. Cardiac Enzymes: No results for input(s): CKTOTAL, CKMB, CKMBINDEX, TROPONINI in the last 168 hours. BNP (last 3 results) No results for input(s): PROBNP in the last 8760 hours. HbA1C: No results for input(s): HGBA1C in the last 72 hours. CBG: No results for input(s): GLUCAP in the last 168  hours. Lipid Profile: No results for input(s): CHOL, HDL, LDLCALC, TRIG, CHOLHDL, LDLDIRECT in the last 72 hours. Thyroid Function Tests: No results for input(s): TSH, T4TOTAL, FREET4, T3FREE, THYROIDAB in the last 72 hours. Anemia Panel: No results for input(s): VITAMINB12, FOLATE, FERRITIN, TIBC, IRON, RETICCTPCT in the last 72 hours. Sepsis Labs: No results for input(s): PROCALCITON, LATICACIDVEN in the last 168 hours.  Recent Results (from the past 240 hour(s))  SARS Coronavirus 2 by RT PCR (hospital order, performed in [facility] hospital lab) Nasopharyngeal Nasopharyngeal Swab     Status: None   Collection Time: 01/03/20 10:13 PM   Specimen: Nasopharyngeal Swab  Result Value Ref Range Status   SARS Coronavirus 2 NEGATIVE NEGATIVE Final    Comment: (NOTE) SARS-CoV-2 target nucleic acids are NOT DETECTED. The SARS-CoV-2 RNA is generally detectable in upper and lower respiratory specimens  during the acute phase of infection. The lowest concentration of SARS-CoV-2 viral copies this assay can detect is 250 copies / mL. A negative result does not preclude SARS-CoV-2 infection and should not be used as the sole basis for treatment or other patient management decisions.  A negative result may occur with improper specimen collection / handling, submission of specimen other than nasopharyngeal swab, presence of viral mutation(s) within the areas targeted by this assay, and inadequate number of viral copies (<250 copies / mL). A negative result must be combined with clinical observations, patient history, and epidemiological information. Fact Sheet for Patients:   StrictlyIdeas.no Fact Sheet for Healthcare Providers: BankingDealers.co.za This test is not yet approved or cleared  by the [place] FDA and has been authorized for detection and/or diagnosis of SARS-CoV-2 by FDA under an Emergency Use Authorization (EUA).  This EUA will  remain in effect (meaning this test can be used) for the duration of the COVID-19 declaration under Section 564(b)(1) of the Act, 21 U.S.C. section 360bbb-3(b)(1), unless the authorization is terminated or revoked sooner. Performed at [facility], [place] [address], [place], [place] [address]   Surgical pcr screen     Status: None   Collection Time: 01/04/20 12:16 PM   Specimen: Nasal Mucosa; Nasal Swab  Result Value Ref Range Status   MRSA, PCR NEGATIVE NEGATIVE Final   Staphylococcus aureus NEGATIVE NEGATIVE Final    Comment: (NOTE) The Xpert SA Assay (FDA approved for NASAL specimens in patients 45 years of age and older), is one component of a comprehensive surveillance program. It is not intended to diagnose infection nor to guide or monitor treatment. Performed at [facility], [place] [address] [place],  [address]          Radiology Studies: DG Chest 1 View  Result Date: 01/03/2020 CLINICAL DATA:  Right knee pain EXAM: CHEST  1 VIEW COMPARISON:  June 18, 2019 FINDINGS: There is mild cardiomegaly. Aortic knob calcifications are seen. Both lungs are clear. No acute osseous abnormality. Partially visualized left shoulder arthroplasty is noted. IMPRESSION: No active disease. Electronically Signed   By: Prudencio Pair M.D.   On: 01/03/2020 21:58   DG Knee 1-2 Views Right  Result Date: 01/03/2020 CLINICAL DATA:  Proximal right knee pain EXAM: RIGHT KNEE - 1-2 VIEW COMPARISON:  None. FINDINGS: The patient is status post right total knee arthroplasty. There is a mildly displaced spiral distal femoral fracture which extends to the superior femoral prosthesis. The tibial component appears to be intact. Overlying soft tissue swelling is seen. IMPRESSION: Mildly displaced spiral fracture of the distal femur with extension to the superior femoral prosthesis. Electronically Signed   By: Prudencio Pair M.D.   On: 01/03/2020 21:58   DG C-Arm 1-60 Min  Result Date:  01/04/2020 CLINICAL DATA:  ORIF, distal femur fracture EXAM: RIGHT FEMUR 2 VIEWS; DG C-ARM 1-60 MIN COMPARISON:  Radiographs 01/03/2020 FINDINGS: Sequential fluoroscopic images depict interval plate and screw fixation of the periprosthetic spiral fracture of the distal femur. Alignment post reduction is near anatomic with successful apposition of bone plate along the cortical surface. Minimal residual anterior apical angulation across the fracture line. Additional hardware related to prior hip arthroplasty and total knee arthroplasty with patellar resurfacing is noted. IMPRESSION: Fluoroscopic images depict ORIF of a periprosthetic spiral fracture of the distal femur. Improved alignment with minimal residual angulation. No acute complication Electronically Signed   By: Lovena Le M.D.   On: 01/04/2020 20:22  DG FEMUR, MIN 2 VIEWS RIGHT  Result Date: 01/04/2020 CLINICAL DATA:  ORIF, distal femur fracture EXAM: RIGHT FEMUR 2 VIEWS; DG C-ARM 1-60 MIN COMPARISON:  Radiographs 01/03/2020 FINDINGS: Sequential fluoroscopic images depict interval plate and screw fixation of the periprosthetic spiral fracture of the distal femur. Alignment post reduction is near anatomic with successful apposition of bone plate along the cortical surface. Minimal residual anterior apical angulation across the fracture line. Additional hardware related to prior hip arthroplasty and total knee arthroplasty with patellar resurfacing is noted. IMPRESSION: Fluoroscopic images depict ORIF of a periprosthetic spiral fracture of the distal femur. Improved alignment with minimal residual angulation. No acute complication Electronically Signed   By: Lovena Le M.D.   On: 01/04/2020 20:22   DG FEMUR, MIN 2 VIEWS RIGHT  Result Date: 01/04/2020 CLINICAL DATA:  Periprosthetic fracture. EXAM: RIGHT FEMUR 2 VIEWS COMPARISON:  Right knee and hip radiographs 01/03/2020 FINDINGS: Sequelae of right total hip arthroplasty and right total knee  arthroplasty are again identified. There is no dislocation. A mildly displaced spiral fracture of the distal femoral shaft is unchanged from yesterday's knee radiographs and is again noted to extend to the superior aspect of the femoral prosthesis at the knee. Atherosclerotic vascular calcifications are noted. IMPRESSION: Unchanged periprosthetic distal femur fracture. Electronically Signed   By: Logan Bores M.D.   On: 01/04/2020 08:05   DG FEMUR PORT, MIN 2 VIEWS RIGHT  Result Date: 01/04/2020 CLINICAL DATA:  Periprosthetic distal right femoral fracture EXAM: RIGHT FEMUR PORTABLE 2 VIEW COMPARISON:  01/04/2020 FINDINGS: Frontal and cross-table lateral views of the right femur are obtained. Right hip arthroplasty remains in expected position without complication. There is a lateral plate and screw fixation traversing the oblique comminuted distal right femoral fracture. Alignment is near anatomic. The visualized components of the right knee arthroplasty are well aligned. IMPRESSION: 1. ORIF of a distal right femoral periprosthetic fracture, with near anatomic alignment. Electronically Signed   By: Randa Ngo M.D.   On: 01/04/2020 19:19   DG Hips Bilat W or Wo Pelvis 3-4 Views  Result Date: 01/03/2020 CLINICAL DATA:  Fall right knee and hip pain EXAM: DG HIP (WITH OR WITHOUT PELVIS) 3-4V BILAT COMPARISON:  None. FINDINGS: The patient is status post bilateral total hip arthroplasties. No definite periprosthetic lucency or fracture is seen. Scattered vascular calcifications are seen. Degenerative changes in the lower lumbar spine and bilateral sacroiliac joints. IMPRESSION: Status post bilateral total hip arthroplasty. No definite periprosthetic lucency or fracture. If there is high clinical suspicion for occult hip fracture or the patient refuses to weightbear, consider further evaluation with cross-sectional imaging. For for Electronically Signed   By: Prudencio Pair M.D.   On: 01/03/2020 21:56         Scheduled Meds: . atorvastatin  20 mg Oral Daily  . busPIRone  15 mg Oral Daily  . docusate sodium  100 mg Oral BID  . enoxaparin (LOVENOX) injection  40 mg Subcutaneous Q24H  . losartan  50 mg Oral Daily   And  . hydrochlorothiazide  12.5 mg Oral Daily  . melatonin  18 mg Oral QHS  . metoprolol tartrate  25 mg Oral BID  . pantoprazole  40 mg Oral Daily   Continuous Infusions:   LOS: 2 days    Time spent: 38 minutes spent on chart review, discussion with nursing staff, consultants, updating family and interview/physical exam; more than 50% of that time was spent in counseling and/or coordination of care.  Offie Waide J [place] DO [facility] Available via Epic secure chat 7am-7pm After these hours, please refer to coverage provider listed on [email or web address] 01/05/2020, 1:14 PM

## 2020-01-05 NOTE — Plan of Care (Signed)
  Problem: Education: Goal: Knowledge of General Education information will improve Description: Including pain rating scale, medication(s)/side effects and non-pharmacologic comfort measures 01/05/2020 0822 by Melina Schools, RN Outcome: Progressing 01/05/2020 0822 by Melina Schools, RN Outcome: Progressing   Problem: Activity: Goal: Risk for activity intolerance will decrease Outcome: Progressing   Problem: Elimination: Goal: Will not experience complications related to urinary retention Outcome: Progressing   Problem: Pain Managment: Goal: General experience of comfort will improve 01/05/2020 0822 by Melina Schools, RN Outcome: Progressing 01/05/2020 0822 by Melina Schools, RN Outcome: Progressing   Problem: Safety: Goal: Ability to remain free from injury will improve 01/05/2020 0822 by Melina Schools, RN Outcome: Progressing 01/05/2020 0822 by Melina Schools, RN Outcome: Progressing

## 2020-01-05 NOTE — Evaluation (Signed)
Physical Therapy Evaluation Patient Details Name: [name] [name] MRN: [id number] DOB: Dec 24, 1940 Today's Date: 01/05/2020   History of Present Illness  79 y.o. female presented to ED 01/03/20 after fall sustaining R periprosthetic distal femur fracture. S/p ORIF R femur 01/04/20. PMH CAD s/p PCI, mild AR, HTN, anemia, CA, chronic LBP.  Clinical Impression  Pt presents with an overall decrease in functional mobility, increase in pain and anxiety and generalized weakness secondary to above. PTA, pt lives in one story home with great grandson and works with daughter at [employer]. Educ on TWB precautions, which pt required increased verbal reenforcement throughout the session. Today, pt able to complete bed mobility mod(A)+2, sit to stand max(A) +2 with RW. Pt indicated increased anxiety with current functional mobility/ injury crying and demonstrating tachypena throughout session with transitional movements. Discussed d/c recommendations to skilled nursing facility, of which pt was agreeable, but would also like to communicate with daughter with potential to go home with her. Pt would benefit from continued acute PT services to maximize functional mobility and independence prior to d/c to next appropriate venue of care.    Follow Up Recommendations SNF;Supervision/Assistance - 24 hour    Equipment Recommendations  Other (comment)(defer to next venue)    Recommendations for Other Services       Precautions / Restrictions Precautions Precautions: Fall Restrictions Weight Bearing Restrictions: Yes RLE Weight Bearing: Touchdown weight bearing      Mobility  Bed Mobility Overal bed mobility: Needs Assistance Bed Mobility: Supine to Sit     Supine to sit: Mod assist;+2 for physical assistance     General bed mobility comments: Pt required mod(A) +2 to elevate trunk and bring RLE to EOB. Pt required assistance to scoot EOB due to BUE pain.  Transfers Overall transfer level: Needs  assistance Equipment used: Rolling walker (2 wheeled) Transfers: Sit to/from Stand Sit to Stand: Max assist;+2 physical assistance         General transfer comment: Pt required max(A) +2 due to increased fear, anxiety and pain in BUE. Upon standing pt demonstrated tachypenia and required frequent verbal cues to slow breathing. Attempted to prompt pt to extend trunk and stand up tall. Pt requested to sit back down.  Ambulation/Gait             General Gait Details: unable  Stairs            Wheelchair Mobility    Modified Rankin (Stroke Patients Only)       Balance Overall balance assessment: Needs assistance   Sitting balance-Leahy Scale: Fair Sitting balance - Comments: Pt able to sit EOB without UE support     Standing balance-Leahy Scale: Zero Standing balance comment: Pt unable to stand without max(A)+2 due to increased fear, anxiety.                             Pertinent Vitals/Pain      Home Living Family/patient expects to be discharged to:: Private residence Living Arrangements: Other relatives(great grandson intermittantly) Available Help at Discharge: Family Type of Home: House Home Access: Stairs to enter Entrance Stairs-Rails: Can reach both Entrance Stairs-Number of Steps: 3 Home Layout: One level Home Equipment: Bedside commode;Walker - 2 wheels;Cane - single point      Prior Function Level of Independence: Independent with assistive device(s)         Comments: Works for [employer] with daughter     Journalist, newspaper  Extremity/Trunk Assessment   Upper Extremity Assessment Upper Extremity Assessment: RUE deficits/detail;LUE deficits/detail RUE Deficits / Details: Pt reports increased pain in BUE that she reports is due to "carpel tunnel" RUE: Unable to fully assess due to pain LUE: Unable to fully assess due to pain    Lower Extremity Assessment Lower Extremity Assessment: Generalized weakness;RLE  deficits/detail RLE: Unable to fully assess due to pain    Cervical / Trunk Assessment Cervical / Trunk Assessment: Kyphotic  Communication   Communication: No difficulties  Cognition Arousal/Alertness: Awake/alert Behavior During Therapy: Anxious Overall Cognitive Status: No family/caregiver present to determine baseline cognitive functioning Area of Impairment: Following commands;Awareness;Problem solving                       Following Commands: Follows multi-step commands consistently;Follows multi-step commands with increased time     Problem Solving: Decreased initiation;Requires verbal cues General Comments: Pt presented with increased anxiety, frusteration and intermittant crying with current hospital stay and new injury. Pt required verbal cues to stay on task and initate movement.      General Comments General comments (skin integrity, edema, etc.): Pt presents with fear and anxiety associated with recent fall and injury. Explained purpose of physical therapy and pt was agreeable. Pt cried x3 throughout session reporting "I dont know how I can do this myself" with functional mobility. With transitional movements Pt became tachypenic despite reassurance of saftey and verbal cues for slowed respiration.    Exercises     Assessment/Plan    PT Assessment Patient needs continued PT services  PT Problem List Decreased strength;Decreased mobility;Decreased range of motion;Decreased activity tolerance;Decreased balance;Decreased knowledge of use of DME;Pain       PT Treatment Interventions DME instruction;Therapeutic activities;Gait training;Therapeutic exercise;Patient/family education;Stair training;Balance training;Functional mobility training    PT Goals (Current goals can be found in the Care Plan section)  Acute Rehab PT Goals Patient Stated Goal: Home PT Goal Formulation: With patient Time For Goal Achievement: 01/19/20 Potential to Achieve Goals: Good     Frequency Min 3X/week   Barriers to discharge Decreased caregiver support Pt lives with great grandson who is intermittantly at home. Pt reports she may be able to stay with daugher "[name] [name]", who has a ramp but unsure at this time.    Co-evaluation               AM-PAC PT "6 Clicks" Mobility  Outcome Measure Help needed turning from your back to your side while in a flat bed without using bedrails?: A Little Help needed moving from lying on your back to sitting on the side of a flat bed without using bedrails?: A Lot Help needed moving to and from a bed to a chair (including a wheelchair)?: A Lot Help needed standing up from a chair using your arms (e.g., wheelchair or bedside chair)?: A Lot Help needed to walk in hospital room?: Total Help needed climbing 3-5 steps with a railing? : Total 6 Click Score: 11    End of Session Equipment Utilized During Treatment: Gait belt Activity Tolerance: Patient limited by pain;Other (comment)(limited by anxiety of movement) Patient left: in bed;with call bell/phone within reach;with bed alarm set Nurse Communication: Mobility status PT Visit Diagnosis: Other abnormalities of gait and mobility (R26.89);Pain Pain - Right/Left: Right Pain - part of body: Leg(BUE)    Time: 3299-2426 PT Time Calculation (min) (ACUTE ONLY): 39 min   Charges:   PT Evaluation $PT Eval Moderate Complexity: 1 Mod PT Treatments $  Therapeutic Activity: 23-37 mins        Rolland Porter SPT 01/05/2020   Rolland Porter 01/05/2020, 1:08 PM

## 2020-01-06 LAB — HEMOGLOBIN AND HEMATOCRIT, BLOOD
HCT: 25.8 % — ABNORMAL LOW (ref 36.0–46.0)
Hemoglobin: 7.9 g/dL — ABNORMAL LOW (ref 12.0–15.0)

## 2020-01-06 LAB — GLUCOSE, CAPILLARY: Glucose-Capillary: 112 mg/dL — ABNORMAL HIGH (ref 70–99)

## 2020-01-06 LAB — BASIC METABOLIC PANEL
Anion gap: 7 (ref 5–15)
BUN: 25 mg/dL — ABNORMAL HIGH (ref 8–23)
CO2: 23 mmol/L (ref 22–32)
Calcium: 8.5 mg/dL — ABNORMAL LOW (ref 8.9–10.3)
Chloride: 109 mmol/L (ref 98–111)
Creatinine, Ser: 1.18 mg/dL — ABNORMAL HIGH (ref 0.44–1.00)
GFR calc Af Amer: 50 mL/min — ABNORMAL LOW (ref >60)
GFR calc non Af Amer: 44 mL/min — ABNORMAL LOW (ref >60)
Glucose, Bld: 107 mg/dL — ABNORMAL HIGH (ref 70–99)
Potassium: 3.8 mmol/L (ref 3.5–5.1)
Sodium: 139 mmol/L (ref 135–145)

## 2020-01-06 LAB — CBC
HCT: 22.2 % — ABNORMAL LOW (ref 36.0–46.0)
Hemoglobin: 6.8 g/dL — CL (ref 12.0–15.0)
MCH: 28.6 pg (ref 26.0–34.0)
MCHC: 30.6 g/dL (ref 30.0–36.0)
MCV: 93.3 fL (ref 80.0–100.0)
Platelets: 135 10*3/uL — ABNORMAL LOW (ref 150–400)
RBC: 2.38 MIL/uL — ABNORMAL LOW (ref 3.87–5.11)
RDW: 14.1 % (ref 11.5–15.5)
WBC: 10.1 10*3/uL (ref 4.0–10.5)
nRBC: 0 % (ref 0.0–0.2)

## 2020-01-06 LAB — PREPARE RBC (CROSSMATCH)

## 2020-01-06 MED ORDER — SODIUM CHLORIDE 0.9% IV SOLUTION: Freq: Once | INTRAVENOUS | Status: DC

## 2020-01-06 NOTE — NC FL2 (Signed)
[place] LEVEL OF CARE SCREENING TOOL     IDENTIFICATION  Patient Name: [name] [name] Birthdate: 04-28-1941 Sex: female Admission Date (Current Location): 01/03/2020  [facility] and [place] Number:  Herbalist and Address:  [facility], [place] [address], [place], [place] [address]      Provider Number: [id number]  Attending Physician Name and Address:  [place] Donnamarie Poag, DO  Relative Name and Phone Number:       Current Level of Care: Hospital Recommended Level of Care: [place] Prior Approval Number:    Date Approved/Denied:   PASRR Number: [id number]  Discharge Plan: SNF    Current Diagnoses: Patient Active Problem List   Diagnosis Date Noted  . Fracture of right femur ([place]) 01/03/2020  . Femur fracture, right ([place]) 01/03/2020  . Renal insufficiency 11/01/2019  . Hyponatremia 06/19/2019  . Pedal edema 10/04/2018  . Grief reaction 09/17/2018  . 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 Routine general medical examination at a health care facility 10/14/2017  . Preop cardiovascular exam 10/01/2017  . CAD S/P percutaneous coronary angioplasty 01/27/2017  . Aortic regurgitation 01/27/2017  . Dyspnea on exertion 08/19/2016  . Left bundle branch block (LBBB) on electrocardiogram 08/19/2016  . Estrogen deficiency 10/08/2015  . Chronic fatigue 08/20/2015  . Deficiency anemia 11/17/2013  . Encounter for Medicare annual wellness exam 07/17/2013  . PMR (polymyalgia rheumatica) (HCC) 07/17/2013  . Intertrigo 04/19/2013  . Expected blood loss anemia 04/05/2013  . Obesity (BMI 30-39.9) 04/05/2013  . 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 IBS (irritable bowel syndrome) 05/30/2012  . Anemia of chronic disease 04/16/2010  . Generalized anxiety disorder 04/16/2010  . GERD 03/04/2009  . Vitamin D deficiency 03/12/2008  . URINARY TRACT INFECTION, RECURRENT 08/07/2007  . 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  FIBROCYSTIC BREAST DISEASE, HX OF 02/11/2007  . Hyperlipidemia with low HDL 01/10/2007  . DEPRESSION 01/10/2007  . RETINAL VEIN OCCLUSION 01/10/2007  . Essential hypertension 01/10/2007  . PEPTIC ULCER DISEASE 01/10/2007  . 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 SKIN CANCER, HX OF 01/10/2007    Orientation RESPIRATION BLADDER Height & Weight     Self, Time, Situation, Place  Normal Incontinent, External catheter Weight: 175 lb (79.4 kg) Height:  5\' 3"  (160 cm)  BEHAVIORAL SYMPTOMS/MOOD NEUROLOGICAL BOWEL NUTRITION STATUS      Continent Diet(see discharge summary)  AMBULATORY STATUS COMMUNICATION OF NEEDS Skin   Extensive Assist Verbally Surgical wounds, Skin abrasions, Other (Comment)(abrasion on L shoulder; ecchymosis on L elbow; closed incision on R thigh w/ compression wrap)                       Personal Care Assistance Level of Assistance  Dressing, Bathing, Feeding Bathing Assistance: Maximum assistance Feeding assistance: Independent Dressing Assistance: Maximum assistance     Functional Limitations Info  Sight, Hearing, Speech Sight Info: Adequate Hearing Info: Adequate Speech Info: Adequate    SPECIAL CARE FACTORS FREQUENCY  PT (By licensed PT), OT (By licensed OT)     PT Frequency: 5x week OT Frequency: 5x week            Contractures Contractures Info: Not present    Additional Factors Info  Code Status, Allergies, Psychotropic Code Status Info: Full Code Allergies Info: Sulfonamide Derivatives, Ace Inhibitors, Oxaprozin, Pregabalin, Amoxicillin-pot Clavulanate Psychotropic Info: busPIRone (BUSPAR) tablet 15 mg daily PO;         Current Medications (01/06/2020):  This  is the current hospital active medication list Current Facility-Administered Medications  Medication Dose Route Frequency Provider Last Rate Last Admin  . 0.9 %  sodium chloride infusion (Manually program via Guardrails IV Fluids)   Intravenous Once Merlene Laughter F, NP      .  atorvastatin (LIPITOR) tablet 20 mg  20 mg Oral Daily Rod Can, MD   20 mg at 01/05/20 1113  . busPIRone (BUSPAR) tablet 15 mg  15 mg Oral Daily Rod Can, MD   15 mg at 01/05/20 1114  . docusate sodium (COLACE) capsule 100 mg  100 mg Oral BID Rod Can, MD   100 mg at 01/05/20 2223  . enoxaparin (LOVENOX) injection 40 mg  40 mg Subcutaneous Q24H Rod Can, MD   40 mg at 01/06/20 0756  . losartan (COZAAR) tablet 50 mg  50 mg Oral Daily Swinteck, Aaron Edelman, MD   50 mg at 01/05/20 1114   And  . hydrochlorothiazide (MICROZIDE) capsule 12.5 mg  12.5 mg Oral Daily Swinteck, Aaron Edelman, MD   12.5 mg at 01/05/20 1000  . HYDROcodone-acetaminophen (NORCO/VICODIN) 5-325 MG per tablet 1-2 tablet  1-2 tablet Oral Q6H PRN Rod Can, MD   2 tablet at 01/06/20 0618  . HYDROmorphone (DILAUDID) injection 0.5 mg  0.5 mg Intravenous Q2H PRN Rod Can, MD   0.5 mg at 01/05/20 1954  . labetalol (NORMODYNE) injection 10 mg  10 mg Intravenous Q4H PRN Swinteck, Aaron Edelman, MD      . melatonin tablet 18 mg  18 mg Oral QHS Rod Can, MD   18 mg at 01/05/20 2223  . menthol-cetylpyridinium (CEPACOL) lozenge 3 mg  1 lozenge Oral PRN Swinteck, Aaron Edelman, MD       Or  . phenol (CHLORASEPTIC) mouth spray 1 spray  1 spray Mouth/Throat PRN Swinteck, Aaron Edelman, MD      . metoCLOPramide (REGLAN) tablet 5-10 mg  5-10 mg Oral Q8H PRN Swinteck, Aaron Edelman, MD       Or  . metoCLOPramide (REGLAN) injection 5-10 mg  5-10 mg Intravenous Q8H PRN Swinteck, Aaron Edelman, MD      . metoprolol tartrate (LOPRESSOR) tablet 25 mg  25 mg Oral BID Rod Can, MD   25 mg at 01/05/20 2223  . ondansetron (ZOFRAN) tablet 4 mg  4 mg Oral Q6H PRN Swinteck, Aaron Edelman, MD       Or  . ondansetron (ZOFRAN) injection 4 mg  4 mg Intravenous Q6H PRN Swinteck, Aaron Edelman, MD      . pantoprazole (PROTONIX) EC tablet 40 mg  40 mg Oral Daily Rod Can, MD   40 mg at 01/05/20 1000  . senna-docusate (Senokot-S) tablet 1 tablet  1 tablet Oral QHS PRN  Rod Can, MD         Discharge Medications: Please see discharge summary for a list of discharge medications.  Relevant Imaging Results:  Relevant Lab Results:   Additional Information [id number] [place], [place]

## 2020-01-06 NOTE — Progress Notes (Signed)
CRITICAL VALUE ALERT  Critical Value: hemoglobin 6.8  Date & Time Notied:  01/06/19 @ 0535 by Lab  Provider Notified: yes paged triad  Orders Received/Actions taken: waiting for others.

## 2020-01-06 NOTE — Progress Notes (Signed)
Subjective: 2 Days Post-Op Procedure(s) (LRB): OPEN REDUCTION INTERNAL FIXATION (ORIF) DISTAL FEMUR FRACTURE (Right) Patient reports pain as mild to moderate. Patient seen in rounds for Dr. Lyla Glassing. Patient is well, and has had no acute complaints or problems other than pain in the right leg with attempted movement. Denies chest pain or SOB.  Objective: Vital signs in last 24 hours: Temp:  [97.6 F (36.4 C)-98.6 F (37 C)] 98.6 F (37 C) (05/15 0800) Pulse Rate:  [75-92] 75 (05/15 0800) Resp:  [16-18] 17 (05/15 0800) BP: (116-137)/(57-79) 133/58 (05/15 0800) SpO2:  [93 %-98 %] 93 % (05/15 0800)  Intake/Output from previous day:  Intake/Output Summary (Last 24 hours) at 01/06/2020 0906 Last data filed at 01/05/2020 1300 Gross per 24 hour  Intake 120 ml  Output 400 ml  Net -280 ml    Intake/Output this shift: No intake/output data recorded.  Labs: Recent Labs    01/03/20 2204 01/04/20 2003 01/05/20 0406 01/06/20 0431  HGB 10.9* 9.0* 8.1* 6.8*   Recent Labs    01/05/20 0406 01/06/20 0431  WBC 7.6 10.1  RBC 2.89* 2.38*  HCT 26.9* 22.2*  PLT 132* 135*   Recent Labs    01/05/20 0406 01/06/20 0431  NA 138 139  K 4.2 3.8  CL 109 109  CO2 21* 23  BUN 26* 25*  CREATININE 1.13* 1.18*  GLUCOSE 152* 107*  CALCIUM 8.4* 8.5*   No results for input(s): LABPT, INR in the last 72 hours.  Exam: General - Patient is Alert and Oriented Extremity - Neurologically intact Neurovascular intact Sensation intact distally Dorsiflexion/Plantar flexion intact Dressing/Incision - clean, dry, no drainage. ACE wrap in place. Motor Function - intact, moving foot and toes well on exam.   Past Medical History:  Diagnosis Date  . Anemia    of chronic disease  . Anxiety   . Arthritis    "psoratic arthritis; new dx" (09/11/2016)  . CAD S/P percutaneous coronary angioplasty 08/2016   mLCx 90% -> 0% (2.75 mm x 16 mm) Synergy DES PCI Ostial D1 70% (not PTCA target). Moderate  ostial and mD2.  LAD tandem 50% stenoses w/ negative FFR (0.86)  . Cancer ([place])    skin cancer on leg  . Chronic lower back pain   . Complication of anesthesia 1980 and 1982   trouble waking up after breast biopsy, many surgeries since no problem  . Depression   . Dyspnea on effort - CHRONIC    partial relief of Sx post PCI of L Circumflex.  . Fibrocystic breast   . GERD (gastroesophageal reflux disease)   . Headache    "monthly" (09/11/2016)  . History of blood transfusion    "several since age 35; I've had them after most of my surgeries" (09/11/2016)  . History of myocardial infarction    "doctor said there are signs I've had a heart attack; don't know when"  - Myoview [name] 2018 indicated small Inferior - Inferoseptal Infarct (vs. rest ischemia). Echo with inferoseptal hypokinesis.  Marland Kitchen Hx of skin cancer, basal cell 2013 and 2012   right inner, lower leg; several scattered around my body  . Hypertension   . Iron deficiency anemia    "I've had an iron infusion"  . Osteoarthritis   . Osteopenia   . Peptic ulcer disease    H pylori  . PMR (polymyalgia rheumatica) (HCC)   . PONV (postoperative nausea and vomiting)   . Psoriasis     Assessment/Plan: 2 Days Post-Op Procedure(s) (  LRB): OPEN REDUCTION INTERNAL FIXATION (ORIF) DISTAL FEMUR FRACTURE (Right) Principal Problem:   Fracture of right femur (HCC) Active Problems:   Anemia of chronic disease   Essential hypertension   Left bundle branch block (LBBB) on electrocardiogram   CAD S/P percutaneous coronary angioplasty   Femur fracture, right (HCC)  Estimated body mass index is 31 kg/m as calculated from the following:   Height as of this encounter: 5\' 3"  (1.6 m).   Weight as of this encounter: 79.4 kg. Up with therapy  DVT Prophylaxis - Lovenox TDWB to the RLE  Hemoglobin 6.8 this AM, 2 units currently transfusing. Will continue to monitor. Disposition per hospitalists  Theresa Duty, PA-C Orthopedic Surgery [phone] 01/06/2020, 9:06 AM

## 2020-01-06 NOTE — Progress Notes (Signed)
PROGRESS NOTE    [name] [name]  WJX:[id number] DOB: [birth year] DOA: 01/03/2020 PCP: Abner Greenspan, MD    Brief Narrative:  [name] [name] is an 79 year old female with past medical history remarkable for CAD status post PCI, HLD, essential hypertension, anemia of chronic disease, and GERD who presented to the ED following mechanical fall at home.  Patient complains of severe right knee pain worse with any type of movement associated with swelling.   In the ED, x-rays notable for right distal femur fracture.  ED physician contacted orthopedics will see in the a.m.  [facility] consulted for admission.   Assessment & Plan:   Principal Problem:   Fracture of right femur (HCC) Active Problems:   Anemia of chronic disease   Essential hypertension   Left bundle branch block (LBBB) on electrocardiogram   CAD S/P percutaneous coronary angioplasty   Femur fracture, right ([place])   Right femur fracture Patient presenting from home following mechanical fall.  Normally fairly mobile at baseline and utilizes a cane occasionally.  X-ray right knee with mildly displaced spiral fracture distal femur with extension to superior femoral prosthesis.  Patient underwent surgical intervention with ORIF on 01/04/2020 by Dr. Lyla Glassing. --Touchdown weightbearing right lower extremity --Postoperative DVT prophylaxis and pain control per Ortho --PT recommends SNF; pending placement  Acute postoperative blood loss anemia Hgb 9.0>8.1>6.8>7.9 --s/p 1 unit PRBCs 5/15; w/ appropriate rise of hemoglobin to 7.9 --Continue monitor CBC daily  Essential hypertension BP 123/79 this morning. --Continue metoprolol tartrate 25 mg p.o. twice daily --Continue home losartan-HCTZ perioperatively --Labetalol 10 mg IV every 4 hours as needed for SBP greater than 170 --Continue to monitor blood pressure closely  CAD s/p PCI --Continue Plavix --Continue statin  Depression --Continue buspirone 15 mg p.o. daily  HLD: Continue  atorvastatin 10 mg p.o. daily  GERD: Continue PPI   DVT prophylaxis: SCDs, Lovenox Code Status: Full code Family Communication: No family present at bedside this morning  Disposition Plan:  Status is: Inpatient  Remains inpatient appropriate because:Ongoing active pain requiring inpatient pain management, Unsafe d/c plan and Inpatient level of care appropriate due to severity of illness for acute complicated right femur fracture   Dispo: The patient is from: Home              Anticipated d/c is to: SNF               Anticipated d/c date is: 2 days              Patient currently is not medically stable to d/c.    Consultants:   [facility] - Dr. Lyla Glassing  Procedures:   ORIF right femur - 5/13 - Dr. Lyla Glassing  Antimicrobials:   Perioperative vancomycin and cefazolin   Subjective: Patient seen and examined at bedside, resting comfortably.  Tired and fatigued.  Hemoglobin down to 6.8 this morning, currently receiving 1 unit PRBC.  Patient now amenable to SNF placement.  No other complaints or concerns at this time. Denies headache, no visual changes, no chest pain, no palpitations, no shortness of breath, no abdominal pain.  No other acute events overnight per nursing staff.  Objective: Vitals:   01/06/20 0647 01/06/20 0703 01/06/20 0800 01/06/20 0925  BP: 123/79 132/61 (!) 133/58 (!) 125/53  Pulse: 87 79 75 79  Resp: 18 16 17 16   Temp: 98 F (36.7 C) 97.6 F (36.4 C) 98.6 F (37 C) 98.6 F (37 C)  TempSrc: Oral Oral Oral Oral  SpO2:  98% 97% 93% 96%  Weight:      Height:        Intake/Output Summary (Last 24 hours) at 01/06/2020 1116 Last data filed at 01/06/2020 0925 Gross per 24 hour  Intake 621.67 ml  Output 400 ml  Net 221.67 ml   Filed Weights   01/03/20 1953 01/04/20 1504  Weight: 79.4 kg 79.4 kg    Examination:  General exam: Appears calm and comfortable  Respiratory system: Clear to auscultation. Respiratory effort normal. Cardiovascular  system: S1 & S2 heard, RRR. No JVD, murmurs, rubs, gallops or clicks. No pedal edema. Gastrointestinal system: Abdomen is nondistended, soft and nontender. No organomegaly or masses felt. Normal bowel sounds heard. Central nervous system: Alert and oriented. No focal neurological deficits. Extremities: No peripheral edema, surgical dressings/Ace wrap noted in place, clean/dry/intact, neurovascularly intact Skin: No rashes, lesions or ulcers Psychiatry: Judgement and insight appear normal. Mood & affect appropriate.     Data Reviewed: I have personally reviewed following labs and imaging studies  CBC: Recent Labs  Lab 01/03/20 2204 01/04/20 2003 01/05/20 0406 01/06/20 0431 01/06/20 1045  WBC 7.3 9.0 7.6 10.1  --   NEUTROABS 4.5  --   --   --   --   HGB 10.9* 9.0* 8.1* 6.8* 7.9*  HCT 36.7 29.4* 26.9* 22.2* 25.8*  MCV 93.6 93.0 93.1 93.3  --   PLT 174 157 132* 135*  --    Basic Metabolic Panel: Recent Labs  Lab 01/03/20 2204 01/04/20 2003 01/05/20 0406 01/06/20 0431  NA 141  --  138 139  K 4.6  --  4.2 3.8  CL 111  --  109 109  CO2 22  --  21* 23  GLUCOSE 102*  --  152* 107*  BUN 32*  --  26* 25*  CREATININE 1.16* 1.19* 1.13* 1.18*  CALCIUM 9.1  --  8.4* 8.5*  MG  --   --  1.7  --    GFR: Estimated Creatinine Clearance: 37.9 mL/min (A) (by C-G formula based on SCr of 1.18 mg/dL (H)). Liver Function Tests: No results for input(s): AST, ALT, ALKPHOS, BILITOT, PROT, ALBUMIN in the last 168 hours. No results for input(s): LIPASE, AMYLASE in the last 168 hours. No results for input(s): AMMONIA in the last 168 hours. Coagulation Profile: No results for input(s): INR, PROTIME in the last 168 hours. Cardiac Enzymes: No results for input(s): CKTOTAL, CKMB, CKMBINDEX, TROPONINI in the last 168 hours. BNP (last 3 results) No results for input(s): PROBNP in the last 8760 hours. HbA1C: No results for input(s): HGBA1C in the last 72 hours. CBG: No results for input(s): GLUCAP  in the last 168 hours. Lipid Profile: No results for input(s): CHOL, HDL, LDLCALC, TRIG, CHOLHDL, LDLDIRECT in the last 72 hours. Thyroid Function Tests: No results for input(s): TSH, T4TOTAL, FREET4, T3FREE, THYROIDAB in the last 72 hours. Anemia Panel: No results for input(s): VITAMINB12, FOLATE, FERRITIN, TIBC, IRON, RETICCTPCT in the last 72 hours. Sepsis Labs: No results for input(s): PROCALCITON, LATICACIDVEN in the last 168 hours.  Recent Results (from the past 240 hour(s))  SARS Coronavirus 2 by RT PCR (hospital order, performed in [facility] hospital lab) Nasopharyngeal Nasopharyngeal Swab     Status: None   Collection Time: 01/03/20 10:13 PM   Specimen: Nasopharyngeal Swab  Result Value Ref Range Status   SARS Coronavirus 2 NEGATIVE NEGATIVE Final    Comment: (NOTE) SARS-CoV-2 target nucleic acids are NOT DETECTED. The SARS-CoV-2 RNA is generally detectable  in upper and lower respiratory specimens during the acute phase of infection. The lowest concentration of SARS-CoV-2 viral copies this assay can detect is 250 copies / mL. A negative result does not preclude SARS-CoV-2 infection and should not be used as the sole basis for treatment or other patient management decisions.  A negative result may occur with improper specimen collection / handling, submission of specimen other than nasopharyngeal swab, presence of viral mutation(s) within the areas targeted by this assay, and inadequate number of viral copies (<250 copies / mL). A negative result must be combined with clinical observations, patient history, and epidemiological information. Fact Sheet for Patients:   StrictlyIdeas.no Fact Sheet for Healthcare Providers: BankingDealers.co.za This test is not yet approved or cleared  by the [place] FDA and has been authorized for detection and/or diagnosis of SARS-CoV-2 by FDA under an Emergency Use Authorization (EUA).   This EUA will remain in effect (meaning this test can be used) for the duration of the COVID-19 declaration under Section 564(b)(1) of the Act, 21 U.S.C. section 360bbb-3(b)(1), unless the authorization is terminated or revoked sooner. Performed at [facility], [place] [address], [place], [place] [address]   Surgical pcr screen     Status: None   Collection Time: 01/04/20 12:16 PM   Specimen: Nasal Mucosa; Nasal Swab  Result Value Ref Range Status   MRSA, PCR NEGATIVE NEGATIVE Final   Staphylococcus aureus NEGATIVE NEGATIVE Final    Comment: (NOTE) The Xpert SA Assay (FDA approved for NASAL specimens in patients 47 years of age and older), is one component of a comprehensive surveillance program. It is not intended to diagnose infection nor to guide or monitor treatment. Performed at [facility], [place] [address] [place], [place] [address]          Radiology Studies: DG C-Arm 1-60 Min  Result Date: 01/04/2020 CLINICAL DATA:  ORIF, distal femur fracture EXAM: RIGHT FEMUR 2 VIEWS; DG C-ARM 1-60 MIN COMPARISON:  Radiographs 01/03/2020 FINDINGS: Sequential fluoroscopic images depict interval plate and screw fixation of the periprosthetic spiral fracture of the distal femur. Alignment post reduction is near anatomic with successful apposition of bone plate along the cortical surface. Minimal residual anterior apical angulation across the fracture line. Additional hardware related to prior hip arthroplasty and total knee arthroplasty with patellar resurfacing is noted. IMPRESSION: Fluoroscopic images depict ORIF of a periprosthetic spiral fracture of the distal femur. Improved alignment with minimal residual angulation. No acute complication Electronically Signed   By: Lovena Le M.D.   On: 01/04/2020 20:22   DG FEMUR, MIN 2 VIEWS RIGHT  Result Date: 01/04/2020 CLINICAL DATA:  ORIF, distal femur fracture EXAM: RIGHT FEMUR 2 VIEWS; DG C-ARM 1-60 MIN COMPARISON:  Radiographs  01/03/2020 FINDINGS: Sequential fluoroscopic images depict interval plate and screw fixation of the periprosthetic spiral fracture of the distal femur. Alignment post reduction is near anatomic with successful apposition of bone plate along the cortical surface. Minimal residual anterior apical angulation across the fracture line. Additional hardware related to prior hip arthroplasty and total knee arthroplasty with patellar resurfacing is noted. IMPRESSION: Fluoroscopic images depict ORIF of a periprosthetic spiral fracture of the distal femur. Improved alignment with minimal residual angulation. No acute complication Electronically Signed   By: Lovena Le M.D.   On: 01/04/2020 20:22   DG FEMUR PORT, MIN 2 VIEWS RIGHT  Result Date: 01/04/2020 CLINICAL DATA:  Periprosthetic distal right femoral fracture EXAM: RIGHT FEMUR PORTABLE 2 VIEW COMPARISON:  01/04/2020  FINDINGS: Frontal and cross-table lateral views of the right femur are obtained. Right hip arthroplasty remains in expected position without complication. There is a lateral plate and screw fixation traversing the oblique comminuted distal right femoral fracture. Alignment is near anatomic. The visualized components of the right knee arthroplasty are well aligned. IMPRESSION: 1. ORIF of a distal right femoral periprosthetic fracture, with near anatomic alignment. Electronically Signed   By: Randa Ngo M.D.   On: 01/04/2020 19:19        Scheduled Meds: . sodium chloride   Intravenous Once  . atorvastatin  20 mg Oral Daily  . busPIRone  15 mg Oral Daily  . docusate sodium  100 mg Oral BID  . enoxaparin (LOVENOX) injection  40 mg Subcutaneous Q24H  . losartan  50 mg Oral Daily   And  . hydrochlorothiazide  12.5 mg Oral Daily  . melatonin  18 mg Oral QHS  . metoprolol tartrate  25 mg Oral BID  . pantoprazole  40 mg Oral Daily   Continuous Infusions:   LOS: 3 days    Time spent: 35 minutes spent on chart review, discussion with  nursing staff, consultants, updating family and interview/physical exam; more than 50% of that time was spent in counseling and/or coordination of care.    Eric J [place] DO [facility] Available via Epic secure chat 7am-7pm After these hours, please refer to coverage provider listed on [email or web address] 01/06/2020, 11:16 AM

## 2020-01-06 NOTE — Plan of Care (Signed)

## 2020-01-06 NOTE — TOC Initial Note (Addendum)
Transition of Care [facility]) - Initial/Assessment Note    Patient Details  Name: [name] [name] MRN: [id number] Date of Birth: 08-Jun-1941  Transition of [facility]) CM/SW Contact:    Bary Castilla, LCSW Phone Number: [phone] 01/06/2020, 12:23 PM  Clinical Narrative:                 CSW met with patient to discuss PT and patient's daughter Bethena Roys about the  recommendation of a SNF. Patient was aware of recommendation and in agreement with going to a ST SNF. CSW discussed the SNF process.CSW provided patient and Bethena Roys with medicare.gov rating list.  Patient gave CSW permission to fax referrals out to local facilities.CSW answered questions about the SNF process and the next steps in the process.They prefer [facility], [facility] or [facility].  CSW spoke with April from [facility] and she is reviewing the referral.  [facility] team will continue to follow for discharge planning needs.      Expected Discharge Plan: Skilled Nursing Facility Barriers to Discharge: SNF Pending bed offer, Continued Medical Work up, Ship broker   Patient Goals and CMS Choice Patient states their goals for this hospitalization and ongoing recovery are:: To be able to go home CMS Medicare.gov Compare Post Acute Care list provided to:: Patient    Expected Discharge Plan and Services Expected Discharge Plan: [place]       Living arrangements for the past 2 months: Single Family Home                                      Prior Living Arrangements/Services Living arrangements for the past 2 months: Single Family Home Lives with:: Self Patient language and need for interpreter reviewed:: Yes Do you feel safe going back to the place where you live?: Yes      Need for Family Participation in Patient Care: Yes (Comment) Care giver support system in place?: Yes (comment)      Activities of Daily Living Home Assistive Devices/Equipment: Cane (specify quad or straight) ADL  Screening (condition at time of admission) Patient's cognitive ability adequate to safely complete daily activities?: Yes Is the patient deaf or have difficulty hearing?: No Does the patient have difficulty seeing, even when wearing glasses/contacts?: No Does the patient have difficulty concentrating, remembering, or making decisions?: No Patient able to express need for assistance with ADLs?: Yes Does the patient have difficulty dressing or bathing?: No Independently performs ADLs?: Yes (appropriate for developmental age) Does the patient have difficulty walking or climbing stairs?: Yes Weakness of Legs: Right Weakness of Arms/Hands: Both  Permission Sought/Granted   Permission granted to share information with : Yes, Verbal Permission Granted  Share Information with NAME: Bethena Roys  Permission granted to share info w AGENCY: SNFs  Permission granted to share info w Relationship: Daughter  Permission granted to share info w Contact Information: [phone]  Emotional Assessment Appearance:: Appears stated age   Affect (typically observed): Accepting, Adaptable Orientation: : Oriented to Self, Oriented to Place, Oriented to  Time, Oriented to Situation      Admission diagnosis:  Fall [W19.XXXA] Femur fracture, right ([place]) [S72.91XA] Fall, initial encounter [W19.XXXA] Closed fracture of distal end of right femur, unspecified fracture morphology, initial encounter ([place]) [S72.401A] Periprosthetic fracture around prosthetic knee [O27.8XXA, [place] Patient Active Problem List   Diagnosis Date Noted  . Fracture of right femur ([place]) 01/03/2020  .  Femur fracture, right ([place]) 01/03/2020  . Renal insufficiency 11/01/2019  . Hyponatremia 06/19/2019  . Pedal edema 10/04/2018  . Grief reaction 09/17/2018  . 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 Routine general medical examination at a health care facility 10/14/2017  . Preop cardiovascular exam 10/01/2017  . CAD S/P percutaneous  coronary angioplasty 01/27/2017  . Aortic regurgitation 01/27/2017  . Dyspnea on exertion 08/19/2016  . Left bundle branch block (LBBB) on electrocardiogram 08/19/2016  . Estrogen deficiency 10/08/2015  . Chronic fatigue 08/20/2015  . Deficiency anemia 11/17/2013  . Encounter for Medicare annual wellness exam 07/17/2013  . PMR (polymyalgia rheumatica) (HCC) 07/17/2013  . Intertrigo 04/19/2013  . Expected blood loss anemia 04/05/2013  . Obesity (BMI 30-39.9) 04/05/2013  . 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 IBS (irritable bowel syndrome) 05/30/2012  . Anemia of chronic disease 04/16/2010  . Generalized anxiety disorder 04/16/2010  . GERD 03/04/2009  . Vitamin D deficiency 03/12/2008  . URINARY TRACT INFECTION, RECURRENT 08/07/2007  . 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 FIBROCYSTIC BREAST DISEASE, HX OF 02/11/2007  . Hyperlipidemia with low HDL 01/10/2007  . DEPRESSION 01/10/2007  . RETINAL VEIN OCCLUSION 01/10/2007  . Essential hypertension 01/10/2007  . PEPTIC ULCER DISEASE 01/10/2007  . 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 SKIN CANCER, HX OF 01/10/2007   PCP:  Abner Greenspan, MD Pharmacy:   [id number] [place] [id number]: [phone]: [phone] [id number] [address] [place] [address]: [phone]: [phone]   Social Determinants of Health (SDOH) Interventions    Readmission Risk Interventions No flowsheet data found.

## 2020-01-06 NOTE — Plan of Care (Signed)

## 2020-01-06 NOTE — Evaluation (Signed)
Occupational Therapy Evaluation Patient Details Name: [name] [name] MRN: [id number] DOB: 26-Dec-1940 Today's Date: 01/06/2020    History of Present Illness 79 y.o. female presented to ED 01/03/20 after fall sustaining R periprosthetic distal femur fracture. S/p ORIF R femur 01/04/20. PMH CAD s/p PCI, mild AR, HTN, anemia, CA, chronic LBP.   Clinical Impression   PTA, pt was living at home with her great grandson, pt was independent with ADL/IADL and functional mobility. Pt was working for her own [employer]. Pt currently significantly limited by pain level. Attempted to progress to EOB, but despite maxA from therapist pt was unable to tolerate mobility. Pt completed bed level BLE exercises. Due to decline in current level of function, pt would benefit from acute OT to address established goals to facilitate safe D/C to venue listed below. At this time, recommend SNF follow-up. Will continue to follow acutely.     Follow Up Recommendations  SNF;Supervision/Assistance - 24 hour    Equipment Recommendations  Other (comment)(defer to next venue)    Recommendations for Other Services       Precautions / Restrictions Precautions Precautions: Fall Restrictions Weight Bearing Restrictions: Yes RLE Weight Bearing: Touchdown weight bearing      Mobility Bed Mobility Overal bed mobility: Needs Assistance Bed Mobility: Rolling Rolling: Max assist         General bed mobility comments: maxA to roll R<>L;pt attempted to progress to EOB but was unsuccessful due to increased pain with movement  Transfers Overall transfer level: Needs assistance               General transfer comment: pt unable to due increased pain    Balance       Sitting balance - Comments: deferred                                   ADL either performed or assessed with clinical judgement   ADL Overall ADL's : Needs assistance/impaired Eating/Feeding: Set up;Sitting   Grooming:  Set up;Sitting   Upper Body Bathing: Moderate assistance   Lower Body Bathing: Maximal assistance;Bed level   Upper Body Dressing : Maximal assistance;Bed level   Lower Body Dressing: Maximal assistance;Bed level     Toilet Transfer Details (indicate cue type and reason): deferred           General ADL Comments: pt limited to bed level this session due to increased pain with any movement, attempted to progress to EOB, pt became anxious and with increased pain     Vision         Perception     Praxis      Pertinent Vitals/Pain Pain Assessment: Faces Faces Pain Scale: Hurts worst Pain Location: RLE with movement Pain Descriptors / Indicators: Crying;Discomfort;Grimacing Pain Intervention(s): Limited activity within patient's tolerance;Monitored during session;Repositioned     Hand Dominance Right   Extremity/Trunk Assessment Upper Extremity Assessment Upper Extremity Assessment: Generalized weakness RUE Deficits / Details: Pt reports increased pain in BUE that she reports is due to "carpel tunnel" RUE: Unable to fully assess due to pain LUE: Unable to fully assess due to pain   Lower Extremity Assessment Lower Extremity Assessment: Defer to PT evaluation RLE: Unable to fully assess due to pain   Cervical / Trunk Assessment Cervical / Trunk Assessment: Kyphotic   Communication Communication Communication: No difficulties   Cognition Arousal/Alertness: Awake/alert Behavior During Therapy: Anxious Overall Cognitive Status: Within Functional  Limits for tasks assessed                                 General Comments: pt appeared to have increased anxiety with movement;pt aware of limitations and discussed acceptance of needing to go to SNF for additional therapy   General Comments       Exercises Exercises: General Lower Extremity;Other exercises General Exercises - Lower Extremity Ankle Circles/Pumps: Both;10 reps;Supine Quad Sets: Both;10  reps;Supine Straight Leg Raises: 10 reps;Supine;Left Other Exercises Other Exercises: bridges pushing through LLE x10 supine   Shoulder Instructions      Home Living Family/patient expects to be discharged to:: Private residence Living Arrangements: Other relatives(great grandson intermittantly) Available Help at Discharge: Family Type of Home: House Home Access: Stairs to enter Technical brewer of Steps: 3 Entrance Stairs-Rails: Can reach both [place]: One level     Bathroom Shower/Tub: Tub/shower unit         [place]: Bedside commode;Walker - 2 wheels;Cane - single point          Prior Functioning/Environment Level of Independence: Independent with assistive device(s)        Comments: Works for [employer] with daughter        OT Problem List: Decreased strength;Decreased range of motion;Decreased activity tolerance;Impaired balance (sitting and/or standing);Decreased safety awareness;Decreased cognition;Decreased knowledge of use of DME or AE;Decreased knowledge of precautions;Obesity;Pain      OT Treatment/Interventions: Self-care/ADL training;Therapeutic exercise;Energy conservation;DME and/or AE instruction;Therapeutic activities;Cognitive remediation/compensation;Patient/family education;Balance training    OT Goals(Current goals can be found in the care plan section) Acute Rehab OT Goals Patient Stated Goal: to get stronger and return home OT Goal Formulation: With patient Time For Goal Achievement: 01/20/20 Potential to Achieve Goals: Good ADL Goals Pt Will Perform Grooming: with modified independence;sitting Pt Will Perform Upper Body Dressing: with set-up;sitting Pt Will Perform Lower Body Dressing: with set-up;with adaptive equipment;sitting/lateral leans Pt Will Transfer to Toilet: with min assist;stand pivot transfer Additional ADL Goal #1: Pt will progress to EOB with supervision in preparation for ADL.  OT Frequency: Min  2X/week   Barriers to D/C: Decreased caregiver support          Co-evaluation              AM-PAC OT "6 Clicks" Daily Activity     Outcome Measure Help from another person eating meals?: A Little Help from another person taking care of personal grooming?: A Little Help from another person toileting, which includes using toliet, bedpan, or urinal?: Total Help from another person bathing (including washing, rinsing, drying)?: Total Help from another person to put on and taking off regular upper body clothing?: A Lot Help from another person to put on and taking off regular lower body clothing?: Total 6 Click Score: 11   End of Session Nurse Communication: Mobility status;Patient requests pain meds  Activity Tolerance: Patient limited by pain Patient left: in bed;with call bell/phone within reach;with bed alarm set  OT Visit Diagnosis: Other abnormalities of gait and mobility (R26.89);Muscle weakness (generalized) (M62.81);History of falling (Z91.81);Other symptoms and signs involving cognitive function;Pain Pain - Right/Left: Right Pain - part of body: Leg                Time: 1041-1106 OT Time Calculation (min): 25 min Charges:  OT General Charges $OT Visit: 1 Visit OT Evaluation $OT Eval Moderate Complexity: 1 Mod OT Treatments $Self Care/Home Management : 8-22  mins  Helene Kelp OTR/L Acute Rehabilitation Services Office: [place] 01/06/2020, 12:43 PM

## 2020-01-07 LAB — CBC
HCT: 25.7 % — ABNORMAL LOW (ref 36.0–46.0)
Hemoglobin: 7.9 g/dL — ABNORMAL LOW (ref 12.0–15.0)
MCH: 28.5 pg (ref 26.0–34.0)
MCHC: 30.7 g/dL (ref 30.0–36.0)
MCV: 92.8 fL (ref 80.0–100.0)
Platelets: 146 10*3/uL — ABNORMAL LOW (ref 150–400)
RBC: 2.77 MIL/uL — ABNORMAL LOW (ref 3.87–5.11)
RDW: 14.2 % (ref 11.5–15.5)
WBC: 8.9 10*3/uL (ref 4.0–10.5)
nRBC: 0 % (ref 0.0–0.2)

## 2020-01-07 LAB — GLUCOSE, CAPILLARY: Glucose-Capillary: 126 mg/dL — ABNORMAL HIGH (ref 70–99)

## 2020-01-07 NOTE — TOC Progression Note (Signed)
Transition of Care [facility]) - Progression Note    Patient Details  Name: [name] [name] MRN: [id number] Date of Birth: 1941-03-18  Transition of Care [facility]) CM/SW [place], LCSW Phone Number: ##[phone] 01/07/2020, 2:43 PM  Clinical Narrative:    CSW met with patient and her 2 daughter to discuss the bed offers. CSW answered questions as they were posed. They chose [facility]. CSW initiated [facility] reference # [id number].  TOC team will continue to follow for discharge planning needs.    Expected Discharge Plan: [place] Barriers to Discharge: SNF Pending bed offer, Continued Medical Work up, Ship broker  Expected Discharge Plan and Services Expected Discharge Plan: [place] arrangements for the past 2 months: Single Family Home                                       Social Determinants of Health (SDOH) Interventions    Readmission Risk Interventions No flowsheet data found.

## 2020-01-07 NOTE — Progress Notes (Signed)
     Subjective: 3 Days Post-Op Procedure(s) (LRB): OPEN REDUCTION INTERNAL FIXATION (ORIF) DISTAL FEMUR FRACTURE (Right)   Patient reports pain as mild/moderate.  Pain primary with movement.  No reported events throughout the night. Discussed aftercare and expectations moving forward.     Objective:   VITALS:   Vitals:   01/07/20 0448 01/07/20 0740  BP: (!) 146/63 (!) 145/61  Pulse: 82 84  Resp: 15 17  Temp: 98.2 F (36.8 C) 98.8 F (37.1 C)  SpO2: 97% 96%    Dorsiflexion/Plantar flexion intact Incision: dressing C/D/I No cellulitis present Compartment soft  LABS Recent Labs    01/05/20 0406 01/05/20 0406 01/06/20 0431 01/06/20 1045 01/07/20 0211  HGB 8.1*   < > 6.8* 7.9* 7.9*  HCT 26.9*   < > 22.2* 25.8* 25.7*  WBC 7.6  --  10.1  --  8.9  PLT 132*  --  135*  --  146*   < > = values in this interval not displayed.    Recent Labs    01/04/20 2003 01/05/20 0406 01/06/20 0431  NA  --  138 139  K  --  4.2 3.8  BUN  --  26* 25*  CREATININE 1.19* 1.13* 1.18*  GLUCOSE  --  152* 107*     Assessment/Plan: 3 Days Post-Op Procedure(s) (LRB): OPEN REDUCTION INTERNAL FIXATION (ORIF) DISTAL FEMUR FRACTURE (Right)   Up with therapy Discharge SNF planned, but working with SW to determine an appropriate one States that medicine discussed giving her blood       Danae Orleans PA-C  [facility]  [place] [address], [address], [place], [place] [address] Phone: [phone] [email or web address] Facebook  [employer]

## 2020-01-07 NOTE — Progress Notes (Signed)
PROGRESS NOTE    [name] [name]  XVQ:[id number] DOB: 26-Jun-1941 DOA: 01/03/2020 PCP: Abner Greenspan, MD    Brief Narrative:  [name] [name] is an 79 year old female with past medical history remarkable for CAD status post PCI, HLD, essential hypertension, anemia of chronic disease, and GERD who presented to the ED following mechanical fall at home.  Patient complains of severe right knee pain worse with any type of movement associated with swelling.   In the ED, x-rays notable for right distal femur fracture.  ED physician contacted orthopedics will see in the a.m.  [facility] consulted for admission.   Assessment & Plan:   Principal Problem:   Fracture of right femur (HCC) Active Problems:   Anemia of chronic disease   Essential hypertension   Left bundle branch block (LBBB) on electrocardiogram   CAD S/P percutaneous coronary angioplasty   Femur fracture, right ([place])   Right femur fracture Patient presenting from home following mechanical fall.  Normally fairly mobile at baseline and utilizes a cane occasionally.  X-ray right knee with mildly displaced spiral fracture distal femur with extension to superior femoral prosthesis.  Patient underwent surgical intervention with ORIF on 01/04/2020 by Dr. Lyla Glassing. --Touchdown weightbearing right lower extremity --Postoperative DVT prophylaxis and pain control per Ortho --PT recommends SNF; pending placement  Acute postoperative blood loss anemia Hgb 9.0>8.1>6.8>7.9>7.9; stable --s/p 1 unit PRBCs 5/15 --Continue monitor CBC daily  Essential hypertension BP 123/79 this morning. --Continue metoprolol tartrate 25 mg p.o. twice daily --Continue home losartan-HCTZ perioperatively --Labetalol 10 mg IV every 4 hours as needed for SBP greater than 170 --Continue to monitor blood pressure closely  CAD s/p PCI --Continue Plavix --Continue statin  Depression --Continue buspirone 15 mg p.o. daily  HLD: Continue atorvastatin 10 mg p.o.  daily  GERD: Continue PPI   DVT prophylaxis: SCDs, Lovenox Code Status: Full code Family Communication: No family present at bedside this morning  Disposition Plan:  Status is: Inpatient  Remains inpatient appropriate because:Ongoing active pain requiring inpatient pain management, Unsafe d/c plan and Inpatient level of care appropriate due to severity of illness for acute complicated right femur fracture   Dispo: The patient is from: Home              Anticipated d/c is to: SNF               Anticipated d/c date is: 2 days              Patient currently is not medically stable to d/c.    Consultants:   [facility] - Dr. Lyla Glassing  Procedures:   ORIF right femur - 5/13 - Dr. Lyla Glassing  Antimicrobials:   Perioperative vancomycin and cefazolin   Subjective: Patient seen and examined at bedside, resting comfortably. Daughters present. Hemoglobin stable at 7.9 today. Concerned about Ace wrap which has slipped distally. Awaiting SNF placement. No other complaints or concerns at this time. Denies headache, no visual changes, no chest pain, no palpitations, no shortness of breath, no abdominal pain.  No other acute events overnight per nursing staff.  Objective: Vitals:   01/06/20 1430 01/06/20 1946 01/07/20 0448 01/07/20 0740  BP: 126/62 140/60 (!) 146/63 (!) 145/61  Pulse: 78 91 82 84  Resp: 17 16 15 17   Temp: 98.5 F (36.9 C) 98.3 F (36.8 C) 98.2 F (36.8 C) 98.8 F (37.1 C)  TempSrc: Oral Oral Oral Oral  SpO2: 99% 98% 97% 96%  Weight:      Height:  Intake/Output Summary (Last 24 hours) at 01/07/2020 1140 Last data filed at 01/07/2020 0900 Gross per 24 hour  Intake 480 ml  Output 3150 ml  Net -2670 ml   Filed Weights   01/03/20 1953 01/04/20 1504  Weight: 79.4 kg 79.4 kg    Examination:  General exam: Appears calm and comfortable  Respiratory system: Clear to auscultation. Respiratory effort normal. Cardiovascular system: S1 & S2 heard, RRR. No  JVD, murmurs, rubs, gallops or clicks. No pedal edema. Gastrointestinal system: Abdomen is nondistended, soft and nontender. No organomegaly or masses felt. Normal bowel sounds heard. Central nervous system: Alert and oriented. No focal neurological deficits. Extremities: No peripheral edema, surgical dressings/Ace wrap noted in place, clean/dry/intact, neurovascularly intact Skin: No rashes, lesions or ulcers Psychiatry: Judgement and insight appear normal. Mood & affect appropriate.     Data Reviewed: I have personally reviewed following labs and imaging studies  CBC: Recent Labs  Lab 01/03/20 2204 01/03/20 2204 01/04/20 2003 01/05/20 0406 01/06/20 0431 01/06/20 1045 01/07/20 0211  WBC 7.3  --  9.0 7.6 10.1  --  8.9  NEUTROABS 4.5  --   --   --   --   --   --   HGB 10.9*   < > 9.0* 8.1* 6.8* 7.9* 7.9*  HCT 36.7   < > 29.4* 26.9* 22.2* 25.8* 25.7*  MCV 93.6  --  93.0 93.1 93.3  --  92.8  PLT 174  --  157 132* 135*  --  146*   < > = values in this interval not displayed.   Basic Metabolic Panel: Recent Labs  Lab 01/03/20 2204 01/04/20 2003 01/05/20 0406 01/06/20 0431  NA 141  --  138 139  K 4.6  --  4.2 3.8  CL 111  --  109 109  CO2 22  --  21* 23  GLUCOSE 102*  --  152* 107*  BUN 32*  --  26* 25*  CREATININE 1.16* 1.19* 1.13* 1.18*  CALCIUM 9.1  --  8.4* 8.5*  MG  --   --  1.7  --    GFR: Estimated Creatinine Clearance: 37.9 mL/min (A) (by C-G formula based on SCr of 1.18 mg/dL (H)). Liver Function Tests: No results for input(s): AST, ALT, ALKPHOS, BILITOT, PROT, ALBUMIN in the last 168 hours. No results for input(s): LIPASE, AMYLASE in the last 168 hours. No results for input(s): AMMONIA in the last 168 hours. Coagulation Profile: No results for input(s): INR, PROTIME in the last 168 hours. Cardiac Enzymes: No results for input(s): CKTOTAL, CKMB, CKMBINDEX, TROPONINI in the last 168 hours. BNP (last 3 results) No results for input(s): PROBNP in the last 8760  hours. HbA1C: No results for input(s): HGBA1C in the last 72 hours. CBG: Recent Labs  Lab 01/06/20 2051  GLUCAP 112*   Lipid Profile: No results for input(s): CHOL, HDL, LDLCALC, TRIG, CHOLHDL, LDLDIRECT in the last 72 hours. Thyroid Function Tests: No results for input(s): TSH, T4TOTAL, FREET4, T3FREE, THYROIDAB in the last 72 hours. Anemia Panel: No results for input(s): VITAMINB12, FOLATE, FERRITIN, TIBC, IRON, RETICCTPCT in the last 72 hours. Sepsis Labs: No results for input(s): PROCALCITON, LATICACIDVEN in the last 168 hours.  Recent Results (from the past 240 hour(s))  SARS Coronavirus 2 by RT PCR (hospital order, performed in [facility] hospital lab) Nasopharyngeal Nasopharyngeal Swab     Status: None   Collection Time: 01/03/20 10:13 PM   Specimen: Nasopharyngeal Swab  Result Value Ref Range Status  SARS Coronavirus 2 NEGATIVE NEGATIVE Final    Comment: (NOTE) SARS-CoV-2 target nucleic acids are NOT DETECTED. The SARS-CoV-2 RNA is generally detectable in upper and lower respiratory specimens during the acute phase of infection. The lowest concentration of SARS-CoV-2 viral copies this assay can detect is 250 copies / mL. A negative result does not preclude SARS-CoV-2 infection and should not be used as the sole basis for treatment or other patient management decisions.  A negative result may occur with improper specimen collection / handling, submission of specimen other than nasopharyngeal swab, presence of viral mutation(s) within the areas targeted by this assay, and inadequate number of viral copies (<250 copies / mL). A negative result must be combined with clinical observations, patient history, and epidemiological information. Fact Sheet for Patients:   StrictlyIdeas.no Fact Sheet for Healthcare Providers: BankingDealers.co.za This test is not yet approved or cleared  by the [place] FDA and has been  authorized for detection and/or diagnosis of SARS-CoV-2 by FDA under an Emergency Use Authorization (EUA).  This EUA will remain in effect (meaning this test can be used) for the duration of the COVID-19 declaration under Section 564(b)(1) of the Act, 21 U.S.C. section 360bbb-3(b)(1), unless the authorization is terminated or revoked sooner. Performed at [facility], [place] [address] [place], [place] [address]   Surgical pcr screen     Status: None   Collection Time: 01/04/20 12:16 PM   Specimen: Nasal Mucosa; Nasal Swab  Result Value Ref Range Status   MRSA, PCR NEGATIVE NEGATIVE Final   Staphylococcus aureus NEGATIVE NEGATIVE Final    Comment: (NOTE) The Xpert SA Assay (FDA approved for NASAL specimens in patients 24 years of age and older), is one component of a comprehensive surveillance program. It is not intended to diagnose infection nor to guide or monitor treatment. Performed at [facility], [place] [address], [place], [place] [address]          Radiology Studies: No results found.      Scheduled Meds: . sodium chloride   Intravenous Once  . atorvastatin  20 mg Oral Daily  . busPIRone  15 mg Oral Daily  . docusate sodium  100 mg Oral BID  . enoxaparin (LOVENOX) injection  40 mg Subcutaneous Q24H  . losartan  50 mg Oral Daily   And  . hydrochlorothiazide  12.5 mg Oral Daily  . melatonin  18 mg Oral QHS  . metoprolol tartrate  25 mg Oral BID  . pantoprazole  40 mg Oral Daily   Continuous Infusions:   LOS: 4 days    Time spent: 35 minutes spent on chart review, discussion with nursing staff, consultants, updating family and interview/physical exam; more than 50% of that time was spent in counseling and/or coordination of care.    Floriene Jeschke J [place] DO [facility] Available via Epic secure chat 7am-7pm After these hours, please refer to coverage provider listed on [email or web address] 01/07/2020, 11:40 AM

## 2020-01-08 ENCOUNTER — Inpatient Hospital Stay (HOSPITAL_COMMUNITY): Payer: Medicare Other

## 2020-01-08 DIAGNOSIS — S7291XA Unspecified fracture of right femur, initial encounter for closed fracture: Secondary | ICD-10-CM

## 2020-01-08 LAB — BPAM RBC
Blood Product Expiration Date: 202105292359
Blood Product Expiration Date: 202106092359
ISSUE DATE / TIME: 202105131811
ISSUE DATE / TIME: 202105150640
Unit Type and Rh: 600
Unit Type and Rh: 600

## 2020-01-08 LAB — CBC
HCT: 26.5 % — ABNORMAL LOW (ref 36.0–46.0)
Hemoglobin: 8.3 g/dL — ABNORMAL LOW (ref 12.0–15.0)
MCH: 28.9 pg (ref 26.0–34.0)
MCHC: 31.3 g/dL (ref 30.0–36.0)
MCV: 92.3 fL (ref 80.0–100.0)
Platelets: 198 10*3/uL (ref 150–400)
RBC: 2.87 MIL/uL — ABNORMAL LOW (ref 3.87–5.11)
RDW: 13.7 % (ref 11.5–15.5)
WBC: 9.2 10*3/uL (ref 4.0–10.5)
nRBC: 0 % (ref 0.0–0.2)

## 2020-01-08 LAB — TYPE AND SCREEN
ABO/RH(D): A NEG
Antibody Screen: POSITIVE
Donor AG Type: NEGATIVE
Donor AG Type: NEGATIVE
PT AG Type: NEGATIVE
Unit division: 0
Unit division: 0

## 2020-01-08 LAB — BASIC METABOLIC PANEL
Anion gap: 8 (ref 5–15)
BUN: 25 mg/dL — ABNORMAL HIGH (ref 8–23)
CO2: 26 mmol/L (ref 22–32)
Calcium: 9 mg/dL (ref 8.9–10.3)
Chloride: 104 mmol/L (ref 98–111)
Creatinine, Ser: 1.02 mg/dL — ABNORMAL HIGH (ref 0.44–1.00)
GFR calc Af Amer: >60 mL/min (ref >60)
GFR calc non Af Amer: 52 mL/min — ABNORMAL LOW (ref >60)
Glucose, Bld: 107 mg/dL — ABNORMAL HIGH (ref 70–99)
Potassium: 4 mmol/L (ref 3.5–5.1)
Sodium: 138 mmol/L (ref 135–145)

## 2020-01-08 LAB — MAGNESIUM: Magnesium: 1.8 mg/dL (ref 1.7–2.4)

## 2020-01-08 LAB — SARS CORONAVIRUS 2 BY RT PCR (HOSPITAL ORDER, PERFORMED IN ~~LOC~~ HOSPITAL LAB): SARS Coronavirus 2: NEGATIVE

## 2020-01-08 LAB — URIC ACID: Uric Acid, Serum: 7.1 mg/dL (ref 2.5–7.1)

## 2020-01-08 MED ORDER — HYDROCODONE-ACETAMINOPHEN 5-325 MG PO TABS: 1.0000 | ORAL_TABLET | ORAL | 0 refills | Status: AC | PRN

## 2020-01-08 MED ORDER — METHOCARBAMOL 500 MG PO TABS
500.0000 mg | ORAL_TABLET | Freq: Three times a day (TID) | ORAL | Status: DC | PRN
  Administered 2020-01-08 (×2): 500 mg via ORAL
  Filled 2020-01-08 (×2): qty 1

## 2020-01-08 MED ORDER — DOCUSATE SODIUM 100 MG PO CAPS: 100.0000 mg | ORAL_CAPSULE | Freq: Two times a day (BID) | ORAL | 0 refills | Status: AC

## 2020-01-08 MED ORDER — MAGNESIUM SULFATE 2 GM/50ML IV SOLN
2.0000 g | Freq: Once | INTRAVENOUS | Status: AC
  Administered 2020-01-08: 2 g via INTRAVENOUS
  Filled 2020-01-08: qty 50

## 2020-01-08 MED ORDER — ENOXAPARIN SODIUM 40 MG/0.4ML ~~LOC~~ SOLN: 40.0000 mg | SUBCUTANEOUS | 0 refills | Status: DC

## 2020-01-08 MED ORDER — METHOCARBAMOL 500 MG PO TABS: 500.0000 mg | ORAL_TABLET | Freq: Three times a day (TID) | ORAL | 0 refills | Status: AC | PRN

## 2020-01-08 NOTE — Progress Notes (Signed)
Report given to [facility] at [facility].  A written discharge packet printed and will be sent via PTAR.

## 2020-01-08 NOTE — Discharge Summary (Signed)
Physician Discharge Summary  [name] [name] ZCH:[id number] DOB: 1941/03/30 DOA: 01/03/2020  PCP: Abner Greenspan, MD  Admit date: 01/03/2020 Discharge date: 01/08/2020  Admitted From: Home Disposition: [facility]    Recommendations for Outpatient Follow-up:  1. Follow up with PCP in 1-2 weeks  2. Follow-up with orthopedics, Dr. Lyla Glassing as needed 3. Continue Lovenox subcutaneously daily x30 days for postoperative DVT prophylaxis  Home Health: No Equipment/Devices: None  Discharge Condition: Stable CODE STATUS: Full code  Diet recommendation: heart healthy, low-salt diet  History of present illness:  [name] [name] is an 78 year old female with past medical history remarkable for CAD status post PCI, HLD, essential hypertension, anemia of chronic disease, and GERD who presented to the ED following mechanical fall at home.  Patient complains of severe right knee pain worse with any type of movement associated with swelling.   In the ED, x-rays notable for right distal femur fracture.  ED physician contacted orthopedics will see in the a.m.  [facility] consulted for admission.  Hospital course:  Right femur fracture Patient presenting from home following mechanical fall.  Normally fairly mobile at baseline and utilizes a cane occasionally.  X-ray right knee with mildly displaced spiral fracture distal femur with extension to superior femoral prosthesis.  Patient underwent surgical intervention with ORIF on 01/04/2020 by Dr. Lyla Glassing.  Patient is touchdown weightbearing right lower extremity with walker.  Continue postoperative DVT prophylaxis with Lovenox x30 days.  Pain control with hydrocodone and Robaxin as needed.  Discharging to SNF for further rehabilitation.  Outpatient follow-up with Dr. Lyla Glassing as scheduled.  Acute postoperative blood loss anemia Postoperatively, hemoglobin dropped to 6.8, received 1 unit PRBC on 01/06/2020.  Hemoglobin stable, 8.3 at time of  discharge.  Essential hypertension Continue home metoprolol tartrate, losartan/HCTZ.  CAD s/p PCI Continue Plavix and statin  Depression Continue buspirone 15 mg p.o. daily  HLD: Continue atorvastatin 10 mg p.o. 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   Ace Inhibitors    cough   Oxaprozin    GI upset    Pregabalin Swelling   SWELLING REACTION UNSPECIFIED    Amoxicillin-pot Clavulanate Nausea And Vomiting   gi upset Has patient had a PCN reaction causing immediate rash, facial/tongue/throat swelling, SOB or lightheadedness with hypotension: No Has patient had a PCN reaction causing severe rash involving mucus membranes or skin necrosis: No Has patient had a PCN reaction that required hospitalization No Has patient had a PCN reaction occurring within the last 10 years: No If all of the above answers are "NO", then may proceed with Cephalosporin use.      Medication List    STOP taking these medications   nitroGLYCERIN 0.4 MG SL tablet Commonly known as: Nitrostat     TAKE these medications   acetaminophen 500 MG tablet Commonly known as: TYLENOL Take 1,000 mg by mouth 2 (two) times daily as needed for moderate pain or headache.   atorvastatin  20 MG tablet Commonly known as: LIPITOR TAKE 1 TABLET BY MOUTH EVERY DAY   busPIRone 15 MG tablet Commonly known as: BUSPAR Take 1 tablet (15 mg total) by mouth daily.   cetirizine 10 MG tablet Commonly known as: ZYRTEC Take 1 tablet (10 mg  total) by mouth daily.   clopidogrel 75 MG tablet Commonly known as: PLAVIX TAKE 1 TABLET BY MOUTH EVERY DAY WITH BREAKFAST What changed: See the new instructions.   docusate sodium 100 MG capsule Commonly known as: COLACE Take 1 capsule (100 mg total) by mouth 2 (two) times daily.   enoxaparin 40 MG/0.4ML injection Commonly known as: LOVENOX Inject 0.4 mLs (40 mg total) into the skin daily for 26 days. Start taking on: [name] 09, 2020   fluticasone 50 MCG/ACT nasal spray Commonly known as: FLONASE Place 2 sprays into both nostrils daily. What changed:   when to take this  reasons to take this   HYDROcodone-acetaminophen 5-325 MG tablet Commonly known as: NORCO/VICODIN Take 1-2 tablets by mouth every 4 (four) hours as needed for up to 7 days for moderate pain or severe pain.   losartan-hydrochlorothiazide 50-12.5 MG tablet Commonly known as: HYZAAR Take 1 tablet by mouth daily.   Melatonin 3 MG Caps Take 18 mg by mouth at bedtime.   methocarbamol 500 MG tablet Commonly known as: ROBAXIN Take 1 tablet (500 mg total) by mouth every 8 (eight) hours as needed for muscle spasms.   metoprolol tartrate 25 MG tablet Commonly known as: LOPRESSOR TAKE 1 TABLET BY MOUTH TWICE A DAY   pantoprazole 40 MG tablet Commonly known as: PROTONIX Take 1 tablet (40 mg total) by mouth daily.   Vitamin D (Cholecalciferol) 25 MCG (1000 UT) Caps Take 2,000 Units by mouth in the morning and at bedtime.       Contact information for follow-up providers    Swinteck, Aaron Edelman, MD. Schedule an appointment as soon as possible for a visit in 2 weeks.   Specialty: Orthopedic Surgery Why: For suture removal Contact information: [address] [place] [address] [phone]        Abner Greenspan, MD. Schedule an appointment as soon as possible for a visit in 1 week(s).   Specialties: Family Medicine, Radiology Contact information: [place]  [address] [phone]            Contact information for after-discharge care    Destination    [facility] Preferred SNF .   Service: Skilled Nursing Contact information: [place] [phone]                 Allergies  Allergen Reactions  . 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 Ace Inhibitors     cough  . Oxaprozin     GI upset   . Pregabalin Swelling    SWELLING REACTION UNSPECIFIED   . Amoxicillin-Pot Clavulanate Nausea And Vomiting    gi upset Has patient had a PCN reaction causing immediate rash, facial/tongue/throat swelling, SOB or lightheadedness with hypotension: No Has patient had a PCN reaction causing severe rash involving mucus membranes or skin necrosis: No Has patient had a PCN reaction that required hospitalization No Has patient had a PCN reaction occurring within the last 10 years: No If all of the above answers are "NO", then may proceed with Cephalosporin use.     Consultations:  [facility] -  Dr. Lyla Glassing   Procedures/Studies: DG Chest 1 View  Result Date: 01/03/2020 CLINICAL DATA:  Right knee pain EXAM: CHEST  1 VIEW COMPARISON:  June 18, 2019 FINDINGS: There is mild cardiomegaly. Aortic knob calcifications are seen. Both lungs are clear. No acute osseous abnormality. Partially visualized left shoulder arthroplasty is noted. IMPRESSION: No active disease. Electronically Signed   By: Prudencio Pair M.D.   On: 01/03/2020 21:58   DG Knee 1-2 Views Right  Result Date: 01/03/2020 CLINICAL DATA:  Proximal right knee pain EXAM: RIGHT KNEE - 1-2 VIEW COMPARISON:  None. FINDINGS: The patient is status post right total knee arthroplasty. There is a mildly displaced spiral distal femoral fracture which extends to the superior femoral prosthesis. The tibial component appears to be intact. Overlying soft tissue swelling is seen. IMPRESSION: Mildly  displaced spiral fracture of the distal femur with extension to the superior femoral prosthesis. Electronically Signed   By: Prudencio Pair M.D.   On: 01/03/2020 21:58   DG Ankle Complete Left  Result Date: 01/08/2020 CLINICAL DATA:  Pain since last night. No known injury. EXAM: LEFT ANKLE COMPLETE - 3+ VIEW COMPARISON:  None. FINDINGS: Osseous alignment is normal. Bone mineralization is normal. No acute or suspicious osseous finding. No fracture line or displaced fracture fragment. No significant degenerative change at the tibiotalar joint space. Vascular calcifications are seen about the LEFT ankle. Soft tissues about the LEFT ankle are otherwise unremarkable. Visualized portions of the midfoot and hindfoot are unremarkable. IMPRESSION: 1. No acute findings. 2. Vascular calcifications. Electronically Signed   By: Franki Cabot M.D.   On: 01/08/2020 11:13   DG Foot Complete Left  Result Date: 01/08/2020 CLINICAL DATA:  Dorsal foot pain since last night. No known injury. EXAM: LEFT FOOT - COMPLETE 3+ VIEW COMPARISON:  None. FINDINGS: There is no evidence of fracture dislocation. There is no evidence of arthropathy or focal bone lesion. Vascular calcifications are seen within the soft tissues. Soft tissues about the LEFT foot are otherwise unremarkable. Incidental note made of chronic osseous spurring along the plantar margin of the posterior calcaneus. IMPRESSION: 1. No acute findings. No osseous fracture or dislocation. 2. Vascular calcifications. Electronically Signed   By: Franki Cabot M.D.   On: 01/08/2020 11:13   DG C-Arm 1-60 Min  Result Date: 01/04/2020 CLINICAL DATA:  ORIF, distal femur fracture EXAM: RIGHT FEMUR 2 VIEWS; DG C-ARM 1-60 MIN COMPARISON:  Radiographs 01/03/2020 FINDINGS: Sequential fluoroscopic images depict interval plate and screw fixation of the periprosthetic spiral fracture of the distal femur. Alignment post reduction is near anatomic with successful apposition of bone plate  along the cortical surface. Minimal residual anterior apical angulation across the fracture line. Additional hardware related to prior hip arthroplasty and total knee arthroplasty with patellar resurfacing is noted. IMPRESSION: Fluoroscopic images depict ORIF of a periprosthetic spiral fracture of the distal femur. Improved alignment with minimal residual angulation. No acute complication Electronically Signed   By: Lovena Le M.D.   On: 01/04/2020 20:22   DG FEMUR, MIN 2 VIEWS RIGHT  Result Date: 01/04/2020 CLINICAL DATA:  ORIF, distal femur fracture EXAM: RIGHT FEMUR 2 VIEWS; DG C-ARM 1-60 MIN COMPARISON:  Radiographs 01/03/2020 FINDINGS: Sequential fluoroscopic images depict interval plate and screw fixation of the periprosthetic spiral fracture of the distal femur. Alignment post reduction is near anatomic with successful apposition of bone plate along the cortical surface. Minimal residual anterior apical angulation across the fracture line. Additional hardware related to prior hip arthroplasty and total  knee arthroplasty with patellar resurfacing is noted. IMPRESSION: Fluoroscopic images depict ORIF of a periprosthetic spiral fracture of the distal femur. Improved alignment with minimal residual angulation. No acute complication Electronically Signed   By: Lovena Le M.D.   On: 01/04/2020 20:22   DG FEMUR, MIN 2 VIEWS RIGHT  Result Date: 01/04/2020 CLINICAL DATA:  Periprosthetic fracture. EXAM: RIGHT FEMUR 2 VIEWS COMPARISON:  Right knee and hip radiographs 01/03/2020 FINDINGS: Sequelae of right total hip arthroplasty and right total knee arthroplasty are again identified. There is no dislocation. A mildly displaced spiral fracture of the distal femoral shaft is unchanged from yesterday's knee radiographs and is again noted to extend to the superior aspect of the femoral prosthesis at the knee. Atherosclerotic vascular calcifications are noted. IMPRESSION: Unchanged periprosthetic distal femur  fracture. Electronically Signed   By: Logan Bores M.D.   On: 01/04/2020 08:05   DG FEMUR PORT, MIN 2 VIEWS RIGHT  Result Date: 01/04/2020 CLINICAL DATA:  Periprosthetic distal right femoral fracture EXAM: RIGHT FEMUR PORTABLE 2 VIEW COMPARISON:  01/04/2020 FINDINGS: Frontal and cross-table lateral views of the right femur are obtained. Right hip arthroplasty remains in expected position without complication. There is a lateral plate and screw fixation traversing the oblique comminuted distal right femoral fracture. Alignment is near anatomic. The visualized components of the right knee arthroplasty are well aligned. IMPRESSION: 1. ORIF of a distal right femoral periprosthetic fracture, with near anatomic alignment. Electronically Signed   By: Randa Ngo M.D.   On: 01/04/2020 19:19   DG Hips Bilat W or Wo Pelvis 3-4 Views  Result Date: 01/03/2020 CLINICAL DATA:  Fall right knee and hip pain EXAM: DG HIP (WITH OR WITHOUT PELVIS) 3-4V BILAT COMPARISON:  None. FINDINGS: The patient is status post bilateral total hip arthroplasties. No definite periprosthetic lucency or fracture is seen. Scattered vascular calcifications are seen. Degenerative changes in the lower lumbar spine and bilateral sacroiliac joints. IMPRESSION: Status post bilateral total hip arthroplasty. No definite periprosthetic lucency or fracture. If there is high clinical suspicion for occult hip fracture or the patient refuses to weightbear, consider further evaluation with cross-sectional imaging. For for Electronically Signed   By: Prudencio Pair M.D.   On: 01/03/2020 21:56      Subjective: Patient seen and examined bedside, resting comfortably.  Complaining of left foot/ankle pain, x-rays negative for fracture or dislocation.  Daughters present, no other complaints or concerns at this time.  Discharging to SNF today.  Denies headache, no fever/chills/night sweats, no nausea/vomiting/diarrhea, no chest pain, no palpitations, no  shortness of breath, no abdominal pain.  No acute events overnight per nursing staff.  Discharge Exam: Vitals:   01/08/20 0810 01/08/20 0945  BP: (!) 147/66 (!) 147/66  Pulse: 87 87  Resp: 17   Temp: 99 F (37.2 C)   SpO2:     Vitals:   01/07/20 1942 01/08/20 0417 01/08/20 0810 01/08/20 0945  BP: (!) 159/64 (!) 139/55 (!) 147/66 (!) 147/66  Pulse: 88 85 87 87  Resp: 15 16 17    Temp: 98.4 F (36.9 C) 99.1 F (37.3 C) 99 F (37.2 C)   TempSrc: Oral Oral Oral   SpO2: 98% 96%    Weight:      Height:        General: Pt is alert, awake, not in acute distress Cardiovascular: RRR, S1/S2 +, no rubs, no gallops Respiratory: CTA bilaterally, no wheezing, no rhonchi Abdominal: Soft, NT, ND, bowel sounds + Extremities: no edema, no  cyanosis, surgical dressing noted, clean/dry/intact    The results of significant diagnostics from this hospitalization (including imaging, microbiology, ancillary and laboratory) are listed below for reference.     Microbiology: Recent Results (from the past 240 hour(s))  SARS Coronavirus 2 by RT PCR (hospital order, performed in [facility] hospital lab) Nasopharyngeal Nasopharyngeal Swab     Status: None   Collection Time: 01/03/20 10:13 PM   Specimen: Nasopharyngeal Swab  Result Value Ref Range Status   SARS Coronavirus 2 NEGATIVE NEGATIVE Final    Comment: (NOTE) SARS-CoV-2 target nucleic acids are NOT DETECTED. The SARS-CoV-2 RNA is generally detectable in upper and lower respiratory specimens during the acute phase of infection. The lowest concentration of SARS-CoV-2 viral copies this assay can detect is 250 copies / mL. A negative result does not preclude SARS-CoV-2 infection and should not be used as the sole basis for treatment or other patient management decisions.  A negative result may occur with improper specimen collection / handling, submission of specimen other than nasopharyngeal swab, presence of viral mutation(s) within  the areas targeted by this assay, and inadequate number of viral copies (<250 copies / mL). A negative result must be combined with clinical observations, patient history, and epidemiological information. Fact Sheet for Patients:   StrictlyIdeas.no Fact Sheet for Healthcare Providers: BankingDealers.co.za This test is not yet approved or cleared  by the [place] FDA and has been authorized for detection and/or diagnosis of SARS-CoV-2 by FDA under an Emergency Use Authorization (EUA).  This EUA will remain in effect (meaning this test can be used) for the duration of the COVID-19 declaration under Section 564(b)(1) of the Act, 21 U.S.C. section 360bbb-3(b)(1), unless the authorization is terminated or revoked sooner. Performed at [facility], [place] [address] [place], [place] [address]   Surgical pcr screen     Status: None   Collection Time: 01/04/20 12:16 PM   Specimen: Nasal Mucosa; Nasal Swab  Result Value Ref Range Status   MRSA, PCR NEGATIVE NEGATIVE Final   Staphylococcus aureus NEGATIVE NEGATIVE Final    Comment: (NOTE) The Xpert SA Assay (FDA approved for NASAL specimens in patients 72 years of age and older), is one component of a comprehensive surveillance program. It is not intended to diagnose infection nor to guide or monitor treatment. Performed at [facility], [place] [address], [place], [place] [address]      Labs: BNP (last 3 results) Recent Labs    06/19/19 0521  BNP 616.0*   Basic Metabolic Panel: Recent Labs  Lab 01/03/20 2204 01/04/20 2003 01/05/20 0406 01/06/20 0431 01/08/20 0706  NA 141  --  138 139 138  K 4.6  --  4.2 3.8 4.0  CL 111  --  109 109 104  CO2 22  --  21* 23 26  GLUCOSE 102*  --  152* 107* 107*  BUN 32*  --  26* 25* 25*  CREATININE 1.16* 1.19* 1.13* 1.18* 1.02*  CALCIUM 9.1  --  8.4* 8.5* 9.0  MG  --   --  1.7  --  1.8   Liver Function Tests: No results for  input(s): AST, ALT, ALKPHOS, BILITOT, PROT, ALBUMIN in the last 168 hours. No results for input(s): LIPASE, AMYLASE in the last 168 hours. No results for input(s): AMMONIA in the last 168 hours. CBC: Recent Labs  Lab 01/03/20 2204 01/03/20 2204 01/04/20 2003 01/04/20 2003 01/05/20 0406 01/06/20 0431 01/06/20 1045 01/07/20 0211 01/08/20 0706  WBC  7.3   < > 9.0  --  7.6 10.1  --  8.9 9.2  NEUTROABS 4.5  --   --   --   --   --   --   --   --   HGB 10.9*   < > 9.0*   < > 8.1* 6.8* 7.9* 7.9* 8.3*  HCT 36.7   < > 29.4*   < > 26.9* 22.2* 25.8* 25.7* 26.5*  MCV 93.6   < > 93.0  --  93.1 93.3  --  92.8 92.3  PLT 174   < > 157  --  132* 135*  --  146* 198   < > = values in this interval not displayed.   Cardiac Enzymes: No results for input(s): CKTOTAL, CKMB, CKMBINDEX, TROPONINI in the last 168 hours. BNP: Invalid input(s): POCBNP CBG: Recent Labs  Lab 01/06/20 2051 01/07/20 2029  GLUCAP 112* 126*   D-Dimer No results for input(s): DDIMER in the last 72 hours. Hgb A1c No results for input(s): HGBA1C in the last 72 hours. Lipid Profile No results for input(s): CHOL, HDL, LDLCALC, TRIG, CHOLHDL, LDLDIRECT in the last 72 hours. Thyroid function studies No results for input(s): TSH, T4TOTAL, T3FREE, THYROIDAB in the last 72 hours.  Invalid input(s): FREET3 Anemia work up No results for input(s): VITAMINB12, FOLATE, FERRITIN, TIBC, IRON, RETICCTPCT in the last 72 hours. Urinalysis    Component Value Date/Time   COLORURINE YELLOW 06/18/2019 2231   APPEARANCEUR HAZY (A) 06/18/2019 2231   LABSPEC 1.018 06/18/2019 2231   LABSPEC 1.010 11/17/2013 1237   PHURINE 5.0 06/18/2019 2231   GLUCOSEU NEGATIVE 06/18/2019 2231   GLUCOSEU Negative 11/17/2013 1237   HGBUR NEGATIVE 06/18/2019 2231   HGBUR small 06/03/2007 [place] 06/18/2019 2231   BILIRUBINUR Negative 11/17/2013 1237   KETONESUR NEGATIVE 06/18/2019 2231   PROTEINUR NEGATIVE 06/18/2019 2231    UROBILINOGEN 0.2 11/17/2013 1237   NITRITE NEGATIVE 06/18/2019 2231   LEUKOCYTESUR MODERATE (A) 06/18/2019 2231   LEUKOCYTESUR Trace 11/17/2013 1237   Sepsis Labs Invalid input(s): PROCALCITONIN,  WBC,  LACTICIDVEN Microbiology Recent Results (from the past 240 hour(s))  SARS Coronavirus 2 by RT PCR (hospital order, performed in [facility] hospital lab) Nasopharyngeal Nasopharyngeal Swab     Status: None   Collection Time: 01/03/20 10:13 PM   Specimen: Nasopharyngeal Swab  Result Value Ref Range Status   SARS Coronavirus 2 NEGATIVE NEGATIVE Final    Comment: (NOTE) SARS-CoV-2 target nucleic acids are NOT DETECTED. The SARS-CoV-2 RNA is generally detectable in upper and lower respiratory specimens during the acute phase of infection. The lowest concentration of SARS-CoV-2 viral copies this assay can detect is 250 copies / mL. A negative result does not preclude SARS-CoV-2 infection and should not be used as the sole basis for treatment or other patient management decisions.  A negative result may occur with improper specimen collection / handling, submission of specimen other than nasopharyngeal swab, presence of viral mutation(s) within the areas targeted by this assay, and inadequate number of viral copies (<250 copies / mL). A negative result must be combined with clinical observations, patient history, and epidemiological information. Fact Sheet for Patients:   StrictlyIdeas.no Fact Sheet for Healthcare Providers: BankingDealers.co.za This test is not yet approved or cleared  by the [place] FDA and has been authorized for detection and/or diagnosis of SARS-CoV-2 by FDA under an Emergency Use Authorization (EUA).  This EUA will remain in effect (meaning this test can be  used) for the duration of the COVID-19 declaration under Section 564(b)(1) of the Act, 21 U.S.C. section 360bbb-3(b)(1), unless the authorization is terminated  or revoked sooner. Performed at [facility], [place] [address] [place], [place] [address]   Surgical pcr screen     Status: None   Collection Time: 01/04/20 12:16 PM   Specimen: Nasal Mucosa; Nasal Swab  Result Value Ref Range Status   MRSA, PCR NEGATIVE NEGATIVE Final   Staphylococcus aureus NEGATIVE NEGATIVE Final    Comment: (NOTE) The Xpert SA Assay (FDA approved for NASAL specimens in patients 64 years of age and older), is one component of a comprehensive surveillance program. It is not intended to diagnose infection nor to guide or monitor treatment. Performed at [facility], [place] [address] [place], [place] [address]      Time coordinating discharge: Over 30 minutes  SIGNED:   Eric J [place] DO  [facility] 01/08/2020, 11:40 AM

## 2020-01-08 NOTE — Progress Notes (Signed)
The patient appears disgruntled and reports that she is in pain . Prn given as ordered with a warm blanket for comfort.  The patient's family member is with the patient. The patient's 2 daughters reported that she needed the bedpan and had been waiting for 20 minutes, when the tech goes in to give the bedpan, the patient reports I did not ask for that.  The daughters report that the patient has been confused.

## 2020-01-08 NOTE — Progress Notes (Signed)
NCM spoke with Amanda/ Blaine Hamper.ID [id number] X 3 days starting  5/17, next review, 5/19 Fax clinicals to [phone]. Alison Murray CM for case. Whitman Hero RN, BSN,CM

## 2020-01-08 NOTE — Progress Notes (Signed)
[facility] arrived to pick up patient. Muscle relaxer administered at 1943

## 2020-01-08 NOTE — Progress Notes (Signed)
Xray tech here for transport to radiology

## 2020-01-08 NOTE — TOC Transition Note (Addendum)
Transition of Care [facility]) - CM/SW Discharge Note   Patient Details  Name: [name] [name] MRN: [id number] Date of Birth: May 22, 1941  Transition of Care [facility]) CM/SW Contact:  Sharin Mons, RN Phone Number: [phone] 01/08/2020, 12:16 PM   Clinical Narrative:     Patient will DC to: [facility] Anticipated DC date: 01/08/2020 Family notified: Bethena Roys ( daughter) Transport by: Corey Harold   Per MD patient ready for DC today . RN, patient, patient's family, and facility notified of DC. Discharge Summary and FL2 sent to facility. RN to call report prior to discharge ([phone]). Rm #[phone]. DC packet on chart. Ambulance transport requested for patient.   RNCM will sign off for now as intervention is no longer needed. Please consult [name] again if new needs arise.     Barriers to Discharge: SNF Pending bed offer, Continued Medical Work up, Ship broker   Patient Goals and CMS Choice Patient states their goals for this hospitalization and ongoing recovery are:: To be able to go home CMS Medicare.gov Compare Post Acute Care list provided to:: Patient    Discharge Placement                       Discharge Plan and Services                                     Social Determinants of Health (SDOH) Interventions     Readmission Risk Interventions No flowsheet data found.

## 2020-01-08 NOTE — Progress Notes (Signed)
Physical Therapy Treatment Patient Details Name: [name] [name] MRN: [id number] DOB: [birth year] Today's Date: 01/08/2020    History of Present Illness 79 y.o. female presented to ED 01/03/20 after fall sustaining R periprosthetic distal femur fracture. S/p ORIF R femur 01/04/20. PMH CAD s/p PCI, mild AR, HTN, anemia, CA, chronic LBP.    PT Comments    Pt in bed upon arrival of PT, agreeable to session with focus on progressing functional bed mobility and capacity for transfers. The pt remains significantly limited by pain, and required significant assistance of 2 to complete transfer to EOB as well as to attempt to stand from EOB. The pt was unsuccessful in attempt to stand today, will continue to benefit from skilled PT to further progress functional strength and independence with transfers prior to return home.     Follow Up Recommendations  SNF;Supervision/Assistance - 24 hour     Equipment Recommendations  Other (comment)(defer to post acute)    Recommendations for Other Services       Precautions / Restrictions Precautions Precautions: Fall Restrictions Weight Bearing Restrictions: Yes RLE Weight Bearing: Touchdown weight bearing    Mobility  Bed Mobility Overal bed mobility: Needs Assistance Bed Mobility: Supine to Sit;Sit to Supine Rolling: Max assist   Supine to sit: Max assist;+2 for physical assistance Sit to supine: Max assist;+2 for physical assistance   General bed mobility comments: maxA to complete transition to sitting EOB, assist at BLE to reduce hip pain, modA to trunk to raise from elevated HOB. Pt required maxA to lay down and reposition in bed, all limited by pain  Transfers Overall transfer level: Needs assistance Equipment used: Rolling walker (2 wheeled) Transfers: Sit to/from Stand Sit to Stand: Max assist;+2 physical assistance         General transfer comment: pt unable to due increased pain, especially in L foot  Ambulation/Gait              General Gait Details: unable   Stairs             Wheelchair Mobility    Modified Rankin (Stroke Patients Only)       Balance Overall balance assessment: Needs assistance Sitting-balance support: No upper extremity supported;Feet supported Sitting balance-Leahy Scale: Good       Standing balance-Leahy Scale: Zero Standing balance comment: Pt unable to stand without max(A)+2 due to increased fear, anxiety.                            Cognition Arousal/Alertness: Awake/alert Behavior During Therapy: Anxious Overall Cognitive Status: Within Functional Limits for tasks assessed                                 General Comments: Pt with increased anxiety due to pain, aware of limitations and need for rehab. pt agreeable, just in a lot of discomfort      Exercises      General Comments General comments (skin integrity, edema, etc.): Pt with anxiety and frustration about lack of ability to mobilize, but agreeable to attempt and understanding of role of acute PT      Pertinent Vitals/Pain Pain Assessment: Faces Faces Pain Scale: Hurts worst Pain Location: RLE with movement, bilateral hands/wrists as pt has carpal tunnel syndrome, L foot (dorsum) Pain Descriptors / Indicators: Crying;Discomfort;Grimacing;Moaning;Shooting;Sharp;Throbbing;Cramping Pain Intervention(s): Limited activity within patient's tolerance;Monitored during session;Repositioned    Home  Living                      Prior Function            PT Goals (current goals can now be found in the care plan section) Acute Rehab PT Goals Patient Stated Goal: to get stronger and return home PT Goal Formulation: With patient Time For Goal Achievement: 01/19/20 Potential to Achieve Goals: Good Progress towards PT goals: Progressing toward goals    Frequency    Min 3X/week      PT Plan Current plan remains appropriate    Co-evaluation               AM-PAC PT "6 Clicks" Mobility   Outcome Measure  Help needed turning from your back to your side while in a flat bed without using bedrails?: A Lot Help needed moving from lying on your back to sitting on the side of a flat bed without using bedrails?: A Lot Help needed moving to and from a bed to a chair (including a wheelchair)?: Total Help needed standing up from a chair using your arms (e.g., wheelchair or bedside chair)?: Total Help needed to walk in hospital room?: Total Help needed climbing 3-5 steps with a railing? : Total 6 Click Score: 8    End of Session Equipment Utilized During Treatment: Gait belt Activity Tolerance: Patient limited by pain;Other (comment) Patient left: in bed;with call bell/phone within reach;with bed alarm set;with family/visitor present Nurse Communication: Mobility status PT Visit Diagnosis: Other abnormalities of gait and mobility (R26.89);Pain Pain - Right/Left: Right Pain - part of body: Leg;Ankle and joints of foot(R hip, L foot, BUE (hands/wrists))     Time: 7001-7494 PT Time Calculation (min) (ACUTE ONLY): 31 min  Charges:  $Therapeutic Activity: 23-37 mins                     Karma Ganja, PT, DPT   Acute Rehabilitation Department Pager #: [phone]   Otho Bellows 01/08/2020, 3:28 PM

## 2020-01-08 NOTE — Plan of Care (Signed)

## 2020-01-09 ENCOUNTER — Encounter: Payer: Self-pay | Admitting: *Deleted

## 2020-01-11 ENCOUNTER — Ambulatory Visit: Payer: Medicare Other

## 2020-01-11 ENCOUNTER — Ambulatory Visit: Payer: Medicare Other | Admitting: Hematology and Oncology

## 2020-01-11 ENCOUNTER — Other Ambulatory Visit: Payer: Medicare Other

## 2020-01-12 ENCOUNTER — Telehealth: Payer: Self-pay | Admitting: Hematology and Oncology

## 2020-01-12 NOTE — Telephone Encounter (Signed)
Scheduled apt per 5/21 sch message - pt is aware of appt date and time .

## 2020-01-15 ENCOUNTER — Other Ambulatory Visit: Payer: Self-pay

## 2020-01-15 ENCOUNTER — Inpatient Hospital Stay: Payer: Medicare Other | Attending: Hematology and Oncology

## 2020-01-15 ENCOUNTER — Inpatient Hospital Stay: Payer: Medicare Other

## 2020-01-15 VITALS — BP 132/60 | HR 79 | Temp 98.6°F | Resp 18

## 2020-01-15 DIAGNOSIS — M353 Polymyalgia rheumatica: Secondary | ICD-10-CM | POA: Diagnosis not present

## 2020-01-15 DIAGNOSIS — Z79899 Other long term (current) drug therapy: Secondary | ICD-10-CM | POA: Diagnosis not present

## 2020-01-15 DIAGNOSIS — D638 Anemia in other chronic diseases classified elsewhere: Secondary | ICD-10-CM | POA: Insufficient documentation

## 2020-01-15 LAB — CMP (CANCER CENTER ONLY)
ALT: 12 U/L (ref 0–44)
AST: 23 U/L (ref 15–41)
Albumin: 3.1 g/dL — ABNORMAL LOW (ref 3.5–5.0)
Alkaline Phosphatase: 94 U/L (ref 38–126)
Anion gap: 9 (ref 5–15)
BUN: 20 mg/dL (ref 8–23)
CO2: 27 mmol/L (ref 22–32)
Calcium: 9 mg/dL (ref 8.9–10.3)
Chloride: 95 mmol/L — ABNORMAL LOW (ref 98–111)
Creatinine: 1.15 mg/dL — ABNORMAL HIGH (ref 0.44–1.00)
GFR, Est AFR Am: 52 mL/min — ABNORMAL LOW (ref >60)
GFR, Estimated: 45 mL/min — ABNORMAL LOW (ref >60)
Glucose, Bld: 130 mg/dL — ABNORMAL HIGH (ref 70–99)
Potassium: 3.7 mmol/L (ref 3.5–5.1)
Sodium: 131 mmol/L — ABNORMAL LOW (ref 135–145)
Total Bilirubin: 0.5 mg/dL (ref 0.3–1.2)
Total Protein: 7 g/dL (ref 6.5–8.1)

## 2020-01-15 LAB — CBC WITH DIFFERENTIAL (CANCER CENTER ONLY)
Abs Immature Granulocytes: 0.42 10*3/uL — ABNORMAL HIGH (ref 0.00–0.07)
Basophils Absolute: 0.1 10*3/uL (ref 0.0–0.1)
Basophils Relative: 0 %
Eosinophils Absolute: 0.2 10*3/uL (ref 0.0–0.5)
Eosinophils Relative: 2 %
HCT: 27.2 % — ABNORMAL LOW (ref 36.0–46.0)
Hemoglobin: 8.6 g/dL — ABNORMAL LOW (ref 12.0–15.0)
Immature Granulocytes: 4 %
Lymphocytes Relative: 14 %
Lymphs Abs: 1.7 10*3/uL (ref 0.7–4.0)
MCH: 28.3 pg (ref 26.0–34.0)
MCHC: 31.6 g/dL (ref 30.0–36.0)
MCV: 89.5 fL (ref 80.0–100.0)
Monocytes Absolute: 0.9 10*3/uL (ref 0.1–1.0)
Monocytes Relative: 8 %
Neutro Abs: 8.4 10*3/uL — ABNORMAL HIGH (ref 1.7–7.7)
Neutrophils Relative %: 72 %
Platelet Count: 591 10*3/uL — ABNORMAL HIGH (ref 150–400)
RBC: 3.04 MIL/uL — ABNORMAL LOW (ref 3.87–5.11)
RDW: 13 % (ref 11.5–15.5)
WBC Count: 11.6 10*3/uL — ABNORMAL HIGH (ref 4.0–10.5)
nRBC: 0 % (ref 0.0–0.2)

## 2020-01-15 MED ORDER — EPOETIN ALFA-EPBX 40000 UNIT/ML IJ SOLN
40000.0000 [IU] | Freq: Once | INTRAMUSCULAR | Status: AC
  Administered 2020-01-15: 40000 [IU] via SUBCUTANEOUS

## 2020-01-15 MED ORDER — EPOETIN ALFA-EPBX 40000 UNIT/ML IJ SOLN
INTRAMUSCULAR | Status: AC
  Filled 2020-01-15: qty 1

## 2020-01-15 NOTE — Patient Instructions (Signed)

## 2020-01-29 ENCOUNTER — Telehealth: Payer: Self-pay

## 2020-01-29 NOTE — Telephone Encounter (Signed)
I am fine with a virtual appointment

## 2020-01-29 NOTE — Telephone Encounter (Signed)
Patient's daughter notified and verbalized understanding. Thanks!

## 2020-01-29 NOTE — Telephone Encounter (Signed)
Patient's daughter Bethena Roys contacted the office and state that patient is scheduled for an hospital f/u on Friday at 4:15, and she states that they really want to keep this appt to discuss her health with you - but she states that she broke her hip back in May, and they are currently undergoing PT to help her with transfers from her wheelchair to the car, and they are still having a hard time - and they just feel unsafe right now bringing her out unless they absolutely have to. She is wondering if they can do this appt virtually?

## 2020-01-31 ENCOUNTER — Telehealth: Payer: Self-pay | Admitting: *Deleted

## 2020-01-31 ENCOUNTER — Telehealth: Payer: Self-pay | Admitting: Cardiology

## 2020-01-31 ENCOUNTER — Other Ambulatory Visit: Payer: Self-pay | Admitting: *Deleted

## 2020-01-31 DIAGNOSIS — D638 Anemia in other chronic diseases classified elsewhere: Secondary | ICD-10-CM

## 2020-01-31 NOTE — Telephone Encounter (Signed)
Left message for pt to call.

## 2020-01-31 NOTE — Telephone Encounter (Signed)
Follow up ° ° °Patient's daughter is returning your call. Please call. °

## 2020-01-31 NOTE — Telephone Encounter (Signed)
I think is probably okay to try to restart at half dose.  I think before she was on the 50-12.5 losartan-HCTZ.  If she still has those, would like to see what her blood pressures run if she restarts one half dose.  As long as they stay with systolics usually above 830 and below 170, I am okay.  [place]

## 2020-01-31 NOTE — Telephone Encounter (Signed)
New message   Patient's daughter wants to know when losartan can be started again? Please call to discuss.

## 2020-01-31 NOTE — Telephone Encounter (Signed)
Received VM from pt daughter Daine Floras.  Attempt x1 to return call, no answer, LVM to return call to the office.

## 2020-01-31 NOTE — Telephone Encounter (Signed)
Spoke with pt daughter, the patient was in a skilled nursing facility after surgery and her losartan was stopped by the md in the facility because of lightheadedness with PT. Now the patient is at home and the family was told to call us about when to restart the losartan. On 6/7= 138/66, 6/8 =after PT 169/73, later in the pm 145/69. Today 161/73 and then 139/68. She has not have any other problems with lightheadedness. Aware dr Ellyn Hack is out of the office and will forward for his review.

## 2020-02-01 NOTE — Telephone Encounter (Signed)
Left detail message of recommendation for patient . asked if daughter to call back to confirm  Receiving instruction.

## 2020-02-02 ENCOUNTER — Telehealth: Payer: Self-pay

## 2020-02-02 ENCOUNTER — Telehealth (INDEPENDENT_AMBULATORY_CARE_PROVIDER_SITE_OTHER): Payer: Medicare Other | Admitting: Family Medicine

## 2020-02-02 ENCOUNTER — Encounter: Payer: Self-pay | Admitting: Family Medicine

## 2020-02-02 VITALS — BP 113/69 | HR 72 | Temp 97.8°F

## 2020-02-02 DIAGNOSIS — F411 Generalized anxiety disorder: Secondary | ICD-10-CM

## 2020-02-02 DIAGNOSIS — I10 Essential (primary) hypertension: Secondary | ICD-10-CM | POA: Diagnosis not present

## 2020-02-02 DIAGNOSIS — N289 Disorder of kidney and ureter, unspecified: Secondary | ICD-10-CM

## 2020-02-02 DIAGNOSIS — S72402S Unspecified fracture of lower end of left femur, sequela: Secondary | ICD-10-CM

## 2020-02-02 DIAGNOSIS — D638 Anemia in other chronic diseases classified elsewhere: Secondary | ICD-10-CM

## 2020-02-02 DIAGNOSIS — E871 Hypo-osmolality and hyponatremia: Secondary | ICD-10-CM | POA: Diagnosis not present

## 2020-02-02 DIAGNOSIS — D500 Iron deficiency anemia secondary to blood loss (chronic): Secondary | ICD-10-CM

## 2020-02-02 MED ORDER — SERTRALINE HCL 25 MG PO TABS: 25.0000 mg | ORAL_TABLET | Freq: Every day | ORAL | 1 refills | Status: DC

## 2020-02-02 NOTE — Telephone Encounter (Signed)
Please ok those verbal orders Her hematologist Dr Lindi Adie will do her cbc in the office next Thursday  I would like home health please to draw a BMET on Monday for HTN/ hyponatremia Thanks  Let me know if you need a written order

## 2020-02-02 NOTE — Telephone Encounter (Signed)
[place] and spoke with the intake nurse. She advised me that the home health nurse just did pt's evaluation and it's not complete/ the paper work isn't done. They don't know yet if she qualifies for their services or not until the eval is complete. They will follow up with the home health nurse that did the initial eval to see if pt will start their services or not. If she is going to start their services they can draw pt's blood but they said it probably wont be on Monday but they will draw it asap. If pt doesn't qualify for their services they will call us back and let us know. She said she will let us know either way on Monday it's to late in the day to do anything now because they are closing at 5pm on a Friday  FYI to PCP

## 2020-02-02 NOTE — Telephone Encounter (Signed)
Diane nurse with [facility] left v/m; pts [facility] nursing has been evaluated. Diane request [place] nursing 1 x a wk for 2 wks; pt has chronic anemia. If labs are needed to be done please call Diane with verbal orders of what labs are needed.

## 2020-02-02 NOTE — Progress Notes (Signed)
Virtual Visit via Video Note  I connected with [name] [name] on 02/02/20 at  4:15 PM EDT by a video enabled telemedicine application and verified that I am speaking with the correct person using two identifiers.  Location: Patient: home Provider: office    I discussed the limitations of evaluation and management by telemedicine and the availability of in person appointments. The patient expressed understanding and agreed to proceed  Parties involved in encounter  [place]  Provider:  [name] MD    History of Present Illness: Pt presents for f/u after hosp and nsg home/rehab stay after spiral fx of distal femur req ORIF on 01/04/20  She did have post op anemia (on top of anemia of chronic dz) dropping Hb to 6.8-then inc to 8.6 after 1 u transfusion She hen had epo injection on 5/24  5/28 it was 8.7 Has appt this Thursday for next epo shot    HTN- recently able to start back on her losartan hct  1/2 dose)   BP Readings from Last 3 Encounters:  02/02/20 113/69  01/15/20 132/60  01/08/20 (!) 136/56   Pulse Readings from Last 3 Encounters:  02/02/20 72  01/15/20 79  01/08/20 85   Lab Results  Component Value Date   WBC 11.6 (H) 01/15/2020   HGB 8.6 (L) 01/15/2020   HCT 27.2 (L) 01/15/2020   MCV 89.5 01/15/2020   PLT 591 (H) 01/15/2020   Lab Results  Component Value Date   CREATININE 1.15 (H) 01/15/2020   BUN 20 01/15/2020   NA 131 (L) 01/15/2020   K 3.7 01/15/2020   CL 95 (L) 01/15/2020   CO2 27 01/15/2020   Weight -Friday before she left she was 171 lb   Wt Readings from Last 3 Encounters:  01/04/20 175 lb (79.4 kg)  12/14/19 176 lb (79.8 kg)  11/16/19 175 lb 3.2 oz (79.5 kg)   Appetite is better now at home More protein and water/less carbs  Spending time using ice 20 min on and off  Gets up to walk  Can transfer from wheelchair to commode w/o assist  Also can transfer to recliner    Is how home with [facility] Doing fairly well with pain  and recovering  Hip and knee hurt after sitting    Osteopenia dexa 6/19  Was on evista and vit D  The doctor at the facility put her on zoloft for 30 days for anxiety/depression   25 mg  Would like to continue    will finish lovenox pretty soon No unexpected bleeding  Patient Active Problem List   Diagnosis Date Noted  . Renal insufficiency 11/01/2019  . Hyponatremia 06/19/2019  . Pedal edema 10/04/2018  . Grief reaction 09/17/2018  . 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 Routine general medical examination at a health care facility 10/14/2017  . Preop cardiovascular exam 10/01/2017  . CAD S/P percutaneous coronary angioplasty 01/27/2017  . Aortic regurgitation 01/27/2017  . Dyspnea on exertion 08/19/2016  . Left bundle branch block (LBBB) on electrocardiogram 08/19/2016  . Estrogen deficiency 10/08/2015  . Chronic fatigue 08/20/2015  . Deficiency anemia 11/17/2013  . Encounter for Medicare annual wellness exam 07/17/2013  . PMR (polymyalgia rheumatica) (HCC) 07/17/2013  . Intertrigo 04/19/2013  . Expected blood loss anemia 04/05/2013  . Obesity (BMI 30-39.9) 04/05/2013  . 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 IBS (irritable bowel syndrome) 05/30/2012  . Anemia of chronic disease 04/16/2010  . Generalized anxiety disorder 04/16/2010  .  GERD 03/04/2009  . Vitamin D deficiency 03/12/2008  . URINARY TRACT INFECTION, RECURRENT 08/07/2007  . 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 FIBROCYSTIC BREAST DISEASE, HX OF 02/11/2007  . Hyperlipidemia with low HDL 01/10/2007  . DEPRESSION 01/10/2007  . RETINAL VEIN OCCLUSION 01/10/2007  . Essential hypertension 01/10/2007  . PEPTIC ULCER DISEASE 01/10/2007  . 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 SKIN CANCER, HX OF 01/10/2007   Past Medical History:  Diagnosis Date  . Anemia    of chronic disease  . Anxiety   . Arthritis    "psoratic arthritis; new dx" (09/11/2016)  . CAD S/P percutaneous coronary angioplasty 08/2016   mLCx  90% -> 0% (2.75 mm x 16 mm) Synergy DES PCI Ostial D1 70% (not PTCA target). Moderate ostial and mD2.  LAD tandem 50% stenoses w/ negative FFR (0.86)  . Cancer ([place])    skin cancer on leg  . Chronic lower back pain   . Complication of anesthesia 1980 and 1982   trouble waking up after breast biopsy, many surgeries since no problem  . Depression   . Dyspnea on effort - CHRONIC    partial relief of Sx post PCI of L Circumflex.  . Fibrocystic breast   . GERD (gastroesophageal reflux disease)   . Headache    "monthly" (09/11/2016)  . History of blood transfusion    "several since age 67; I've had them after most of my surgeries" (09/11/2016)  . History of myocardial infarction    "doctor said there are signs I've had a heart attack; don't know when"  - Myoview [name] 2018 indicated small Inferior - Inferoseptal Infarct (vs. rest ischemia). Echo with inferoseptal hypokinesis.  Marland Kitchen Hx of skin cancer, basal cell 2013 and 2012   right inner, lower leg; several scattered around my body  . Hypertension   . Iron deficiency anemia    "I've had an iron infusion"  . Osteoarthritis   . Osteopenia   . Peptic ulcer disease    H pylori  . PMR (polymyalgia rheumatica) (HCC)   . PONV (postoperative nausea and vomiting)   . Psoriasis    Past Surgical History:  Procedure Laterality Date  . APPENDECTOMY    . BACK SURGERY     x 6   . BREAST BIOPSY Left 1980 and 1982   "benign"  . CARDIAC CATHETERIZATION N/A 09/11/2016   Procedure: Left Heart Cath and Coronary Angiography;  Surgeon: Leonie Man, MD;  Location: [place] [facility];  Service: Cardiovascular: mLCx 90% * (PCI). Ostial D1 70% (not PTCA target). Mod ostial and mD2.  LAD tandem 50% stenoses w/ negative FFR (0.86)  . CARDIAC CATHETERIZATION N/A 09/11/2016   Procedure: Intravascular Pressure Wire/FFR Study;  Surgeon: Leonie Man, MD;  Location: [place] [facility];  Service: Cardiovascular: --  LAD tandem 50% stenoses w/ negative FFR (0.86  .  CARDIAC CATHETERIZATION N/A 09/11/2016   Procedure: Coronary Stent Intervention;  Surgeon: Leonie Man, MD;  Location: [place] [facility];  Service: Cardiovascular: -- DES PCI mLCx 90%--0% SYNERGY DES 2.75 MM X 16 MM.  Marland Kitchen CARDIAC CATHETERIZATION  2001   non-obstructive CAD  . CATARACT EXTRACTION W/ INTRAOCULAR LENS  IMPLANT, BILATERAL  July -  August 2017  . DILATION AND CURETTAGE OF UTERUS  X 3  . ESOPHAGOGASTRODUODENOSCOPY  03/2009   ulcer,HP, GERD stricture  . EXCISIONAL HEMORRHOIDECTOMY    . HIP ARTHROPLASTY Left 2010  . JOINT REPLACEMENT    . KNEE ARTHROSCOPY Bilateral   .  LAPAROSCOPIC CHOLECYSTECTOMY  2005  . LUMBAR DISC SURGERY  11/1984; 1987; 1989; 2004; 2005 X 2   deg. disk lumber spine   . MOHS SURGERY Right    "inside my lower leg"  . NASAL SINUS SURGERY  1970s  . NM MYOVIEW LTD  08/28/2016   INTERMEDIATE RISK (b/c reduced EF & ? small infarct) -- EF ~35-40%.  ? Prior Small Inferior-apical & septal infarct (w/o ischemia).  Difuse HK - worse in inferoapex.  Suggest prior infarct with no ischemia. Ischemic area correlates with echo WMA  . ORIF FEMUR FRACTURE Right 01/04/2020   Procedure: OPEN REDUCTION INTERNAL FIXATION (ORIF) DISTAL FEMUR FRACTURE;  Surgeon: Rod Can, MD;  Location: [place];  Service: Orthopedics;  Laterality: Right;  . REVERSE SHOULDER ARTHROPLASTY Left 12/17/2017   Procedure: LEFT REVERSE SHOULDER ARTHROPLASTY;  Surgeon: Netta Cedars, MD;  Location: [place];  Service: Orthopedics;  Laterality: Left;  . TOTAL HIP ARTHROPLASTY Right 04/04/2013   Procedure: RIGHT TOTAL HIP ARTHROPLASTY ANTERIOR APPROACH;  Surgeon: Mauri Pole, MD;  Location: [facility];  Service: Orthopedics;  Laterality: Right;  . TOTAL KNEE ARTHROPLASTY Right 2009  . TRANSTHORACIC ECHOCARDIOGRAM  08/2016   mild conc LVH. EF 45-50% - anteroseptal & inferoseptal HK. Mod AI.  Marland Kitchen TRANSTHORACIC ECHOCARDIOGRAM  08/2017   (post PCI) --> EF normalized:  Normal LV systolic function; mild diastolic  dysfunction; mild   LVH; sclerotic aortic valve with mild AI.  No RWMA  . TUBAL LIGATION    . VAGINAL HYSTERECTOMY  1970s   partial    Social History   Tobacco Use  . Smoking status: Never Smoker  . Smokeless tobacco: Never Used  Vaping Use  . Vaping Use: Never used  Substance Use Topics  . Alcohol use: Yes    Alcohol/week: 0.0 standard drinks    Comment: 09/11/2016 "might have a margarita q 3-4 months"  . Drug use: No   Family History  Problem Relation Age of Onset  . Arthritis Mother   . Emphysema Mother   . Hyperlipidemia Mother   . Hypertension Mother   . Heart disease Mother   . Heart disease Brother        CAD  . Diabetes Paternal Grandmother   . Heart disease Maternal Aunt    Allergies  Allergen Reactions  . 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 Ace Inhibitors     cough  . Oxaprozin     GI upset   . Pregabalin Swelling    SWELLING REACTION UNSPECIFIED   . Amoxicillin-Pot Clavulanate Nausea And Vomiting    gi upset Has patient had a PCN reaction causing immediate rash, facial/tongue/throat swelling, SOB or lightheadedness with hypotension: No Has patient had a PCN reaction causing severe rash involving mucus membranes or skin necrosis: No Has patient had a PCN reaction that required hospitalization No Has patient had a PCN reaction occurring within the last 10 years: No If all of the above answers are "NO", then may proceed with Cephalosporin use.    Current Outpatient Medications on File Prior to Visit  Medication Sig Dispense Refill  . acetaminophen (TYLENOL) 500 MG tablet Take 1,000 mg by mouth 2 (two) times daily as needed for moderate pain or headache.     Marland Kitchen atorvastatin (LIPITOR) 20 MG tablet TAKE 1 TABLET BY MOUTH EVERY DAY (Patient taking differently: Take 20 mg by mouth daily. ) 90 tablet 3  . busPIRone (BUSPAR) 15 MG tablet Take 1  tablet (15 mg total) by mouth daily. 90 tablet 3  . cetirizine (ZYRTEC) 10 MG  tablet Take 1 tablet (10 mg total) by mouth daily. 30 tablet 11  . clopidogrel (PLAVIX) 75 MG tablet TAKE 1 TABLET BY MOUTH EVERY DAY WITH BREAKFAST (Patient taking differently: Take 75 mg by mouth daily. ) 90 tablet 3  . docusate sodium (COLACE) 100 MG capsule Take 1 capsule (100 mg total) by mouth 2 (two) times daily. 60 capsule 0  . enoxaparin (LOVENOX) 40 MG/0.4ML injection Inject 0.4 mLs (40 mg total) into the skin daily for 26 days. 10.4 mL 0  . fluticasone (FLONASE) 50 MCG/ACT nasal spray Place 2 sprays into both nostrils daily. (Patient taking differently: Place 2 sprays into both nostrils daily as needed for allergies. ) 16 g 6  . losartan-hydrochlorothiazide (HYZAAR) 50-12.5 MG tablet Take 1 tablet by mouth daily. 90 tablet 1  . Melatonin 3 MG CAPS Take 18 mg by mouth at bedtime.     . methocarbamol (ROBAXIN) 500 MG tablet Take 1 tablet (500 mg total) by mouth every 8 (eight) hours as needed for muscle spasms. 30 tablet 0  . metoprolol tartrate (LOPRESSOR) 25 MG tablet TAKE 1 TABLET BY MOUTH TWICE A DAY (Patient taking differently: Take 25 mg by mouth 2 (two) times daily. ) 180 tablet 1  . pantoprazole (PROTONIX) 40 MG tablet Take 1 tablet (40 mg total) by mouth daily. 90 tablet 1  . Vitamin D, Cholecalciferol, 25 MCG (1000 UT) CAPS Take 2,000 Units by mouth in the morning and at bedtime.      No current facility-administered medications on file prior to visit.   Review of Systems  Constitutional: Negative for chills, fever and malaise/fatigue.  HENT: Negative for congestion, ear pain, sinus pain and sore throat.   Eyes: Negative for blurred vision, discharge and redness.  Respiratory: Negative for cough, shortness of breath and stridor.   Cardiovascular: Negative for chest pain, palpitations and leg swelling.  Gastrointestinal: Negative for abdominal pain, diarrhea, nausea and vomiting.  Musculoskeletal: Positive for falls and joint pain. Negative for myalgias.       R leg pain s/p  recent surgery from femur fx  Skin: Negative for rash.  Neurological: Negative for dizziness and headaches.  Psychiatric/Behavioral: The patient is nervous/anxious.        Mood is improved wit zoloft    Observations/Objective: Patient appears well, in no distress Weight is baseline  No facial swelling or asymmetry Normal voice-not hoarse and no slurred speech No obvious tremor or mobility impairment Moving neck and UEs normally Able to hear the call well  No cough or shortness of breath during interview  Talkative and mentally sharp with no cognitive changes No skin changes on face or neck , no rash or pallor Affect is normal    Assessment and Plan: Problem List Items Addressed This Visit      Cardiovascular and Mediastinum   Essential hypertension (Chronic)    bp in fair control at this time  BP Readings from Last 1 Encounters:  02/02/20 113/69   No changes needed Most recent labs reviewed  Disc lifstyle change with low sodium diet and exercise          Musculoskeletal and Integument   Closed fracture of left distal femur ([place]) - Primary    This occurred with a fall req orif on 5/13 Reviewed hospital records, lab results and studies in detail  This occurred between artificial knee and hip (spiral fx)  Overall pt did well with surgery and rehab stay   Now at home with [facility] Treated for complicated anemia  Due for bmet-this was ordered          Genitourinary   Renal insufficiency    Due for re check  Eating/drinking better now at home  Last na low- but now taking 1/2 dose of the losartan hct dose prev at the hospital  Will arrange blood draw from her [facility] agency next week        Other   Anemia of chronic disease (Chronic)    Compounded with anemia after ORIF for femur fracture -was severe  Improving after 1 u transfusion and one epo shot  For another shot next week Continues f/u with hematology      Generalized anxiety disorder    zoloft was added at her  rehab facility for this and depression  Taking 25 mg and doing well  I would like to continue for at least 2-3 more months given stressors of recovery from femur fx Refilled today Also taking buspar      Relevant Medications   sertraline (ZOLOFT) 25 MG tablet   Expected blood loss anemia    S/p ORIF for femur fracture-in addn to her anemia of chronic disease-Hb went down below 7  Reviewed hospital records, lab results and studies in detail   Improved after transfusion and one epo shot  For another epo shot next week  Hematology continues to follow      Hyponatremia    This needs a re check from hospital  Now on 1/2 dose of her losartan/hct          Follow Up Instructions: Continue follow up with hematology and orthopedics  I will work on home health blood draw order for bmet   Continue your therapy  Glad you are doing better   Let's continue the zoloft for several more months to help mood during recovery   I discussed the assessment and treatment plan with the patient. The patient was provided an opportunity to ask questions and all were answered. The patient agreed with the plan and demonstrated an understanding of the instructions.   The patient was advised to call back or seek an in-person evaluation if the symptoms worsen or if the condition fails to improve as anticipated.    [name], MD

## 2020-02-04 DIAGNOSIS — Z8781 Personal history of (healed) traumatic fracture: Secondary | ICD-10-CM | POA: Insufficient documentation

## 2020-02-04 NOTE — Patient Instructions (Signed)
Continue follow up with hematology and orthopedics  I will work on home health blood draw order for bmet   Continue your therapy  Glad you are doing better   Let's continue the zoloft for several more months to help mood during recovery

## 2020-02-04 NOTE — Assessment & Plan Note (Signed)
bp in fair control at this time  BP Readings from Last 1 Encounters:  02/02/20 113/69   No changes needed Most recent labs reviewed  Disc lifstyle change with low sodium diet and exercise

## 2020-02-04 NOTE — Assessment & Plan Note (Signed)
S/p ORIF for femur fracture-in addn to her anemia of chronic disease-Hb went down below 7  Reviewed hospital records, lab results and studies in detail   Improved after transfusion and one epo shot  For another epo shot next week  Hematology continues to follow

## 2020-02-04 NOTE — Assessment & Plan Note (Signed)
This needs a re check from hospital  Now on 1/2 dose of her losartan/hct

## 2020-02-04 NOTE — Assessment & Plan Note (Signed)
Due for re check  Eating/drinking better now at home  Last na low- but now taking 1/2 dose of the losartan hct dose prev at the hospital  Will arrange blood draw from her [facility] agency next week

## 2020-02-04 NOTE — Assessment & Plan Note (Signed)
zoloft was added at her rehab facility for this and depression  Taking 25 mg and doing well  I would like to continue for at least 2-3 more months given stressors of recovery from femur fx Refilled today Also taking buspar

## 2020-02-04 NOTE — Assessment & Plan Note (Signed)
Compounded with anemia after ORIF for femur fracture -was severe  Improving after 1 u transfusion and one epo shot  For another shot next week Continues f/u with hematology

## 2020-02-04 NOTE — Assessment & Plan Note (Signed)
This occurred with a fall req orif on 5/13 Reviewed hospital records, lab results and studies in detail  This occurred between artificial knee and hip (spiral fx) Overall pt did well with surgery and rehab stay   Now at home with [facility] Treated for complicated anemia  Due for bmet-this was ordered

## 2020-02-06 LAB — BASIC METABOLIC PANEL
Creatinine: 1.1 (ref <=1.1)
Sodium: 131 — AB (ref 137–147)

## 2020-02-06 LAB — COMPREHENSIVE METABOLIC PANEL: GFR calc non Af Amer: 49

## 2020-02-06 NOTE — Telephone Encounter (Signed)
[name] [name] with [facility] called back, they are accepting pt as a client and they are drawing her labs today. They said Dr. Glori Bickers should get results by tomorrow afternoon.   FYI to PCP

## 2020-02-07 ENCOUNTER — Telehealth: Payer: Self-pay | Admitting: Family Medicine

## 2020-02-07 MED ORDER — LOSARTAN POTASSIUM 25 MG PO TABS
25.0000 mg | ORAL_TABLET | Freq: Every day | ORAL | 3 refills | Status: DC
Start: 2020-02-07 — End: 2020-05-06

## 2020-02-07 NOTE — Telephone Encounter (Signed)
I sent in losartan 25 mg to take one daily   Have home care draw bmet in 1-2 wk please  Alert if any blood pressure issues

## 2020-02-07 NOTE — Telephone Encounter (Signed)
Labs rec from [facility] drawn by home care   Cr 1.08 and GFR 49 -improved   Sodium level is still slightly low  This may be from the hct (in her losartan hct)  We will likely need to change to losartan w/o hct since it is flushing out her sodium   Please verify what dose she is taking and what pharmacy is preferred Thanks

## 2020-02-07 NOTE — Telephone Encounter (Signed)
Pt notified of lab results and Dr. Marliss Coots comments. Please see phone note from cardiology where they advised pt to take a 1/2 tab of the losartan-hctz. Pt has the dose on file 50-12.5 mg but is only taking 1/2 tab of that daily. Pt said she just got a new bottle of med and since she is only taking 1/2 tab it will last her 2 months. Pt question if she has to change med since she is only taking 1/2 tab and the HCTZ part is already a low dose. Pt said if she still does need to change Rx she uses [facility].

## 2020-02-07 NOTE — Telephone Encounter (Signed)
Pt notified Rx sent to pharmacy. I advised of Dr. Marliss Coots comments.   Will call [facility] nurse in am to give order to recheck labs

## 2020-02-08 ENCOUNTER — Inpatient Hospital Stay: Payer: Medicare Other | Admitting: Adult Health

## 2020-02-08 ENCOUNTER — Other Ambulatory Visit: Payer: Medicare Other

## 2020-02-08 ENCOUNTER — Inpatient Hospital Stay: Payer: Medicare Other | Attending: Hematology and Oncology

## 2020-02-08 ENCOUNTER — Inpatient Hospital Stay: Payer: Medicare Other

## 2020-02-08 ENCOUNTER — Encounter: Payer: Self-pay | Admitting: Family Medicine

## 2020-02-08 ENCOUNTER — Other Ambulatory Visit: Payer: Self-pay

## 2020-02-08 ENCOUNTER — Encounter: Payer: Self-pay | Admitting: Adult Health

## 2020-02-08 VITALS — BP 129/54 | HR 60 | Temp 99.1°F | Resp 18

## 2020-02-08 DIAGNOSIS — D638 Anemia in other chronic diseases classified elsewhere: Secondary | ICD-10-CM

## 2020-02-08 DIAGNOSIS — M353 Polymyalgia rheumatica: Secondary | ICD-10-CM | POA: Diagnosis present

## 2020-02-08 LAB — CMP (CANCER CENTER ONLY)
ALT: 17 U/L (ref 0–44)
AST: 20 U/L (ref 15–41)
Albumin: 3.3 g/dL — ABNORMAL LOW (ref 3.5–5.0)
Alkaline Phosphatase: 97 U/L (ref 38–126)
Anion gap: 9 (ref 5–15)
BUN: 19 mg/dL (ref 8–23)
CO2: 25 mmol/L (ref 22–32)
Calcium: 9.1 mg/dL (ref 8.9–10.3)
Chloride: 98 mmol/L (ref 98–111)
Creatinine: 1.04 mg/dL — ABNORMAL HIGH (ref 0.44–1.00)
GFR, Est AFR Am: 59 mL/min — ABNORMAL LOW (ref >60)
GFR, Estimated: 51 mL/min — ABNORMAL LOW (ref >60)
Glucose, Bld: 102 mg/dL — ABNORMAL HIGH (ref 70–99)
Potassium: 4.1 mmol/L (ref 3.5–5.1)
Sodium: 132 mmol/L — ABNORMAL LOW (ref 135–145)
Total Bilirubin: 0.4 mg/dL (ref 0.3–1.2)
Total Protein: 6.7 g/dL (ref 6.5–8.1)

## 2020-02-08 LAB — SAMPLE TO BLOOD BANK

## 2020-02-08 LAB — CBC WITH DIFFERENTIAL (CANCER CENTER ONLY)
Abs Immature Granulocytes: 0.03 10*3/uL (ref 0.00–0.07)
Basophils Absolute: 0.1 10*3/uL (ref 0.0–0.1)
Basophils Relative: 1 %
Eosinophils Absolute: 0.1 10*3/uL (ref 0.0–0.5)
Eosinophils Relative: 1 %
HCT: 33 % — ABNORMAL LOW (ref 36.0–46.0)
Hemoglobin: 10.2 g/dL — ABNORMAL LOW (ref 12.0–15.0)
Immature Granulocytes: 0 %
Lymphocytes Relative: 20 %
Lymphs Abs: 1.5 10*3/uL (ref 0.7–4.0)
MCH: 28.4 pg (ref 26.0–34.0)
MCHC: 30.9 g/dL (ref 30.0–36.0)
MCV: 91.9 fL (ref 80.0–100.0)
Monocytes Absolute: 0.5 10*3/uL (ref 0.1–1.0)
Monocytes Relative: 7 %
Neutro Abs: 5.1 10*3/uL (ref 1.7–7.7)
Neutrophils Relative %: 71 %
Platelet Count: 264 10*3/uL (ref 150–400)
RBC: 3.59 MIL/uL — ABNORMAL LOW (ref 3.87–5.11)
RDW: 13.2 % (ref 11.5–15.5)
WBC Count: 7.3 10*3/uL (ref 4.0–10.5)
nRBC: 0 % (ref 0.0–0.2)

## 2020-02-08 MED ORDER — EPOETIN ALFA-EPBX 40000 UNIT/ML IJ SOLN
INTRAMUSCULAR | Status: AC
  Filled 2020-02-08: qty 1

## 2020-02-08 MED ORDER — EPOETIN ALFA-EPBX 40000 UNIT/ML IJ SOLN
40000.0000 [IU] | Freq: Once | INTRAMUSCULAR | Status: AC
  Administered 2020-02-08: 40000 [IU] via SUBCUTANEOUS

## 2020-02-08 NOTE — Telephone Encounter (Signed)
Yes-please schedule lab appt for that bmp in 1-2 wk  Thanks

## 2020-02-08 NOTE — Patient Instructions (Signed)

## 2020-02-08 NOTE — Assessment & Plan Note (Addendum)
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  Lab review 10/12/2017: Hemoglobin 10.2, MCV 94, normal WBC and platelet counts, creatinine 1.02 04/13/2019:Hemoglobin 9 05/19/2019:Hemoglobin 9.1 07/13/2019: Hemoglobin 10.3 08/24/2019:Hemoglobin 10.2 02/08/2020: hemoglobin 10.2:  Current treatment:Retacrit 40,000 unitscurrentlyreceiving itevery 3-4 weeks.    She is receiving this injection with Dr. Lindi Adie when the hemoglobin is 10.4 and below, if higher than that, we are not giving the injection.  She will receive this today. I reviewed with her son in law the reason for the treatment.   She is recovering from her surgery remarkably well.  I wished her a continued speedy recovery.    We will see her again on 03/09/2020 for labs, f/u, and her next injection.

## 2020-02-08 NOTE — Telephone Encounter (Signed)
Spoke with [facility] nurse and they advised me that pt has been d/c from their services. They did the initial eval and since we had requested blood work last time they did one additional PRN visit for [name] so they could do her labs. They said that there is no [facility] nursing needs that they can help pt with at this time.   Dr. Glori Bickers do you want pt to come in for labs or please advised of your plan

## 2020-02-08 NOTE — Progress Notes (Signed)
[facility] Follow up:    [name], [name] Fanny, MD [place] [address]   DIAGNOSIS: anemia of   SUMMARY OF HEMATOLOGIC HISTORY: 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 ( anemia of chronic inflammation), patient has chronic polymyalgia rheumatica I reviewed her blood counts with her. 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  CURRENT THERAPY: Retacrit every 4 weeks  INTERVAL HISTORY: [name] [name] 79 y.o. female returns for evaluation of her anemia.  She was hospitalized and underwent right femur surgery due to spiral fracture on 01/08/2020.  Her hemoglobin was down to 6.4 and she received a unit of blood in the hospital.  She has since been discharged, has been to rehab and is now living with her daughter and son in law.  Her sodium level has been decreased and she saw Dr. Glori Bickers about it.  Her losartan-hct was changed to losartan alone.  Her sodium level is improved today.    [name] is fatigued.  She denies any other issues today.  Patient Active Problem List   Diagnosis Date Noted  . Closed fracture of left distal femur ([place]) 02/04/2020  . Renal insufficiency 11/01/2019  . Hyponatremia 06/19/2019  . Pedal edema 10/04/2018  . Grief reaction 09/17/2018  . 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 Routine general medical examination at a health care facility 10/14/2017  . Preop cardiovascular exam 10/01/2017  . CAD S/P percutaneous coronary angioplasty 01/27/2017  . Aortic regurgitation 01/27/2017  . Dyspnea on exertion 08/19/2016  . Left bundle branch block (LBBB) on electrocardiogram 08/19/2016  . Estrogen deficiency 10/08/2015  . Chronic fatigue 08/20/2015  . Deficiency anemia 11/17/2013  . Encounter for Medicare annual wellness exam 07/17/2013  . PMR (polymyalgia rheumatica) (HCC) 07/17/2013  . Intertrigo 04/19/2013  . Expected blood loss anemia 04/05/2013  . Obesity (BMI 30-39.9) 04/05/2013  . S/P right THA,  AA 04/04/2013  . IBS (irritable bowel syndrome) 05/30/2012  . Anemia of chronic disease 04/16/2010  . Generalized anxiety disorder 04/16/2010  . GERD 03/04/2009  . Vitamin D deficiency 03/12/2008  . URINARY TRACT INFECTION, RECURRENT 08/07/2007  . 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 FIBROCYSTIC BREAST DISEASE, HX OF 02/11/2007  . Hyperlipidemia with low HDL 01/10/2007  . DEPRESSION 01/10/2007  . RETINAL VEIN OCCLUSION 01/10/2007  . Essential hypertension 01/10/2007  . PEPTIC ULCER DISEASE 01/10/2007  . 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 SKIN CANCER, HX OF 01/10/2007    is allergic to sulfonamide derivatives, ace inhibitors, hydrochlorothiazide, oxaprozin, pregabalin, and amoxicillin-pot clavulanate.  MEDICAL HISTORY: Past Medical History:  Diagnosis Date  . Anemia    of chronic disease  . Anxiety   . Arthritis    "psoratic arthritis; new dx" (09/11/2016)  . CAD S/P percutaneous coronary angioplasty 08/2016   mLCx 90% -> 0% (2.75 mm x 16 mm) Synergy DES PCI Ostial D1 70% (not PTCA target). Moderate ostial and mD2.  LAD tandem 50% stenoses w/ negative FFR (0.86)  . Cancer ([place])    skin cancer on leg  . Chronic lower back pain   . Complication of anesthesia 1980 and 1982   trouble waking up after breast biopsy, many surgeries since no problem  . Depression   . Dyspnea on effort - CHRONIC    partial relief of Sx post PCI of L Circumflex.  . Fibrocystic breast   . GERD (gastroesophageal reflux disease)   . Headache    "monthly" (09/11/2016)  . History of blood transfusion    "  several since age 22; I've had them after most of my surgeries" (09/11/2016)  . History of myocardial infarction    "doctor said there are signs I've had a heart attack; don't know when"  - Myoview [name] 2018 indicated small Inferior - Inferoseptal Infarct (vs. rest ischemia). Echo with inferoseptal hypokinesis.  Marland Kitchen Hx of skin cancer, basal cell 2013 and 2012   right inner, lower leg; several scattered  around my body  . Hypertension   . Iron deficiency anemia    "I've had an iron infusion"  . Osteoarthritis   . Osteopenia   . Peptic ulcer disease    H pylori  . PMR (polymyalgia rheumatica) (HCC)   . PONV (postoperative nausea and vomiting)   . Psoriasis     SURGICAL HISTORY: Past Surgical History:  Procedure Laterality Date  . APPENDECTOMY    . BACK SURGERY     x 6   . BREAST BIOPSY Left 1980 and 1982   "benign"  . CARDIAC CATHETERIZATION N/A 09/11/2016   Procedure: Left Heart Cath and Coronary Angiography;  Surgeon: Leonie Man, MD;  Location: [facility];  Service: Cardiovascular: mLCx 90% * (PCI). Ostial D1 70% (not PTCA target). Mod ostial and mD2.  LAD tandem 50% stenoses w/ negative FFR (0.86)  . CARDIAC CATHETERIZATION N/A 09/11/2016   Procedure: Intravascular Pressure Wire/FFR Study;  Surgeon: Leonie Man, MD;  Location: [facility];  Service: Cardiovascular: --  LAD tandem 50% stenoses w/ negative FFR (0.86  . CARDIAC CATHETERIZATION N/A 09/11/2016   Procedure: Coronary Stent Intervention;  Surgeon: Leonie Man, MD;  Location: [facility];  Service: Cardiovascular: -- DES PCI mLCx 90%--0% SYNERGY DES 2.75 MM X 16 MM.  Marland Kitchen CARDIAC CATHETERIZATION  2001   non-obstructive CAD  . CATARACT EXTRACTION W/ INTRAOCULAR LENS  IMPLANT, BILATERAL  July -  August 2017  . DILATION AND CURETTAGE OF UTERUS  X 3  . ESOPHAGOGASTRODUODENOSCOPY  03/2009   ulcer,HP, GERD stricture  . EXCISIONAL HEMORRHOIDECTOMY    . HIP ARTHROPLASTY Left 2010  . JOINT REPLACEMENT    . KNEE ARTHROSCOPY Bilateral   . LAPAROSCOPIC CHOLECYSTECTOMY  2005  . LUMBAR DISC SURGERY  11/1984; 1987; 1989; 2004; 2005 X 2   deg. disk lumber spine   . MOHS SURGERY Right    "inside my lower leg"  . NASAL SINUS SURGERY  1970s  . NM MYOVIEW LTD  08/28/2016   INTERMEDIATE RISK (b/c reduced EF & ? small infarct) -- EF ~35-40%.  ? Prior Small Inferior-apical & septal infarct (w/o ischemia).   Difuse HK - worse in inferoapex.  Suggest prior infarct with no ischemia. Ischemic area correlates with echo WMA  . ORIF FEMUR FRACTURE Right 01/04/2020   Procedure: OPEN REDUCTION INTERNAL FIXATION (ORIF) DISTAL FEMUR FRACTURE;  Surgeon: Rod Can, MD;  Location: [facility];  Service: Orthopedics;  Laterality: Right;  . REVERSE SHOULDER ARTHROPLASTY Left 12/17/2017   Procedure: LEFT REVERSE SHOULDER ARTHROPLASTY;  Surgeon: Netta Cedars, MD;  Location: [facility];  Service: Orthopedics;  Laterality: Left;  . TOTAL HIP ARTHROPLASTY Right 04/04/2013   Procedure: RIGHT TOTAL HIP ARTHROPLASTY ANTERIOR APPROACH;  Surgeon: Mauri Pole, MD;  Location: [facility];  Service: Orthopedics;  Laterality: Right;  . TOTAL KNEE ARTHROPLASTY Right 2009  . TRANSTHORACIC ECHOCARDIOGRAM  08/2016   mild conc LVH. EF 45-50% - anteroseptal & inferoseptal HK. Mod AI.  Marland Kitchen TRANSTHORACIC ECHOCARDIOGRAM  08/2017   (post PCI) --> EF normalized:  Normal LV systolic function; mild diastolic dysfunction; mild   LVH; sclerotic aortic valve with mild AI.  No RWMA  . TUBAL LIGATION    . VAGINAL HYSTERECTOMY  1970s   partial     SOCIAL HISTORY: Social History   Socioeconomic History  . Marital status: Married    Spouse name: Not on file  . Number of children: Not on file  . Years of education: Not on file  . Highest education level: Not on file  Occupational History  . Not on file  Tobacco Use  . Smoking status: Never Smoker  . Smokeless tobacco: Never Used  Vaping Use  . Vaping Use: Never used  Substance and Sexual Activity  . Alcohol use: Yes    Alcohol/week: 0.0 standard drinks    Comment: 09/11/2016 "might have a margarita q 3-4 months"  . Drug use: No  . Sexual activity: Not Currently  Other Topics Concern  . Not on file  Social History Narrative   Now Widowed as of [name] 2020.   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   Social Determinants of Health   Financial Resource Strain: Low Risk   . Difficulty of Paying Living Expenses: Not hard at all  Food Insecurity: No Food Insecurity  . Worried About Charity fundraiser in the Last Year: Never true  . Ran Out of Food in the Last Year: Never true  Transportation Needs: No Transportation Needs  . Lack of Transportation (Medical): No  . Lack of Transportation (Non-Medical): No  Physical Activity: Inactive  . Days of Exercise per Week: 0 days  . Minutes of Exercise per Session: 0 min  Stress: No Stress Concern Present  . Feeling of Stress : Not at all  Social Connections:   . Frequency of Communication with Friends and Family:   . Frequency of Social Gatherings with Friends and Family:   . Attends Religious Services:   . Active Member of Clubs or Organizations:   . Attends Archivist Meetings:   Marland Kitchen Marital Status:   Intimate Partner Violence: Not At Risk  . Fear of Current or Ex-Partner: No  . Emotionally Abused: No  . Physically Abused: No  . Sexually Abused: No    FAMILY HISTORY: Family History  Problem Relation Age of Onset  . Arthritis Mother   . Emphysema Mother   . Hyperlipidemia Mother   . Hypertension Mother   . Heart disease Mother   . Heart disease Brother        CAD  . Diabetes Paternal Grandmother   . Heart disease Maternal Aunt     Review of Systems  Constitutional: Positive for fatigue. Negative for appetite change, chills, fever and unexpected weight change.  HENT:   Negative for hearing loss, lump/mass and trouble swallowing.   Eyes: Negative for eye problems and icterus.  Respiratory: Negative for chest tightness, cough and shortness of breath.   Cardiovascular: Negative for leg swelling and palpitations.  Gastrointestinal: Negative for abdominal distention, abdominal pain, constipation, diarrhea, nausea and vomiting.  Endocrine: Negative for  hot flashes.  Genitourinary: Negative for difficulty urinating.   Musculoskeletal: Positive for arthralgias.  Skin: Negative for itching and rash.  Neurological: Negative for dizziness.  Hematological: Negative for adenopathy.  Psychiatric/Behavioral: Negative  for depression. The patient is not nervous/anxious.       PHYSICAL EXAMINATION  ECOG PERFORMANCE STATUS: 3 - Symptomatic, >50% confined to bed  Vitals:   02/08/20 1252  BP: (!) 129/54  Pulse: 60  Resp: 18  Temp: 99.1 F (37.3 C)  SpO2: 98%    Physical Exam Constitutional:      Appearance: Normal appearance.  HENT:     Head: Normocephalic and atraumatic.  Eyes:     General: No scleral icterus.    Pupils: Pupils are equal, round, and reactive to light.  Cardiovascular:     Rate and Rhythm: Normal rate and regular rhythm.     Pulses: Normal pulses.     Heart sounds: Normal heart sounds.  Pulmonary:     Effort: Pulmonary effort is normal.     Breath sounds: Normal breath sounds.  Abdominal:     General: Abdomen is flat. Bowel sounds are normal.     Palpations: Abdomen is soft.  Musculoskeletal:        General: No swelling.     Cervical back: Neck supple.  Lymphadenopathy:     Cervical: No cervical adenopathy.  Skin:    General: Skin is warm and dry.     Capillary Refill: Capillary refill takes less than 2 seconds.     Findings: No rash.     Comments: Right anterior thigh scar noted, no swelling, or sign of infection in the leg   Neurological:     General: No focal deficit present.     Mental Status: She is alert.  Psychiatric:        Mood and Affect: Mood normal.        Behavior: Behavior normal.     LABORATORY DATA:  CBC    Component Value Date/Time   WBC 7.3 02/08/2020 1156   WBC 9.2 01/08/2020 0706   RBC 3.59 (L) 02/08/2020 1156   HGB 10.2 (L) 02/08/2020 1156   HGB 9.9 (L) 09/29/2016 1146   HGB 10.5 (L) 08/28/2015 1313   HCT 33.0 (L) 02/08/2020 1156   HCT 31.0 (L) 09/29/2016 1146   HCT  32.7 (L) 08/28/2015 1313   PLT 264 02/08/2020 1156   PLT 277 09/29/2016 1146   MCV 91.9 02/08/2020 1156   MCV 93 09/29/2016 1146   MCV 90.6 08/28/2015 1313   MCH 28.4 02/08/2020 1156   MCHC 30.9 02/08/2020 1156   RDW 13.2 02/08/2020 1156   RDW 15.4 09/29/2016 1146   RDW 12.9 08/28/2015 1313   LYMPHSABS 1.5 02/08/2020 1156   LYMPHSABS 1.3 08/28/2015 1313   MONOABS 0.5 02/08/2020 1156   MONOABS 0.6 08/28/2015 1313   EOSABS 0.1 02/08/2020 1156   EOSABS 0.0 08/28/2015 1313   EOSABS 0.1 04/03/2010 1147   BASOSABS 0.1 02/08/2020 1156   BASOSABS 0.0 08/28/2015 1313    CMP     Component Value Date/Time   NA 132 (L) 02/08/2020 1156   NA 131 (A) 02/06/2020 0000   NA 141 12/10/2014 1012   K 4.1 02/08/2020 1156   K 3.7 12/10/2014 1012   CL 98 02/08/2020 1156   CL 98 02/28/2009 1250   CO2 25 02/08/2020 1156   CO2 25 12/10/2014 1012   GLUCOSE 102 (H) 02/08/2020 1156   GLUCOSE 104 12/10/2014 1012   GLUCOSE 103 02/28/2009 1250   BUN 19 02/08/2020 1156   BUN 21 09/08/2016 1104   BUN 15.7 12/10/2014 1012   CREATININE 1.04 (H) 02/08/2020 1156   CREATININE 1.01 (H)  09/16/2018 1703   CREATININE 0.9 12/10/2014 1012   CALCIUM 9.1 02/08/2020 1156   CALCIUM 9.2 12/10/2014 1012   PROT 6.7 02/08/2020 1156   PROT 6.2 10/27/2016 1015   PROT 6.8 12/10/2014 1012   ALBUMIN 3.3 (L) 02/08/2020 1156   ALBUMIN 3.6 10/27/2016 1015   ALBUMIN 3.5 12/10/2014 1012   AST 20 02/08/2020 1156   AST 18 12/10/2014 1012   ALT 17 02/08/2020 1156   ALT 13 12/10/2014 1012   ALKPHOS 97 02/08/2020 1156   ALKPHOS 61 12/10/2014 1012   BILITOT 0.4 02/08/2020 1156   BILITOT 0.47 12/10/2014 1012   GFRNONAA 51 (L) 02/08/2020 1156   GFRAA 59 (L) 02/08/2020 1156       ASSESSMENT and THERAPY PLAN:   Anemia of chronic disease 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 ( anemia of chronic inflammation), patient has chronic polymyalgia rheumatica  Left shoulder arthroplasty:  12/17/2017  Lab review 10/12/2017: Hemoglobin 10.2, MCV 94, normal WBC and platelet counts, creatinine 1.02 04/13/2019:Hemoglobin 9 05/19/2019:Hemoglobin 9.1 07/13/2019: Hemoglobin 10.3 08/24/2019:Hemoglobin 10.2 02/08/2020: hemoglobin 10.2:  Current treatment:Retacrit 40,000 unitscurrentlyreceiving itevery 3-4 weeks.    She is receiving this injection with Dr. Lindi Adie when the hemoglobin is 10.4 and below, if higher than that, we are not giving the injection.  She will receive this today. I reviewed with her son in law the reason for the treatment.   She is recovering from her surgery remarkably well.  I wished her a continued speedy recovery.    We will see her again on 03/09/2020 for labs, f/u, and her next injection.     All questions were answered. The patient knows to call the clinic with any problems, questions or concerns. We can certainly see the patient much sooner if necessary.  Total encounter time: 30 minutes*  Wilber Bihari, NP 02/08/20 3:12 PM Medical Oncology and Hematology [facility] [place], [place] [address] Tel. [phone]    Fax. [phone]  *Total Encounter Time as defined by the Centers for Medicare and Medicaid Services includes, in addition to the face-to-face time of a patient visit (documented in the note above) non-face-to-face time: obtaining and reviewing outside history, ordering and reviewing medications, tests or procedures, care coordination (communications with other health care professionals or caregivers) and documentation in the medical record.

## 2020-02-09 ENCOUNTER — Telehealth: Payer: Self-pay | Admitting: Oncology

## 2020-02-09 ENCOUNTER — Telehealth: Payer: Self-pay | Admitting: Adult Health

## 2020-02-09 NOTE — Telephone Encounter (Signed)
appt scheduled

## 2020-02-09 NOTE — Telephone Encounter (Signed)
No 6/17 los. No changes made to pt's schedule.  

## 2020-02-14 ENCOUNTER — Telehealth: Payer: Self-pay | Admitting: Family Medicine

## 2020-02-14 MED ORDER — METHOCARBAMOL 500 MG PO TABS: 500.0000 mg | ORAL_TABLET | Freq: Three times a day (TID) | ORAL | 1 refills | Status: DC | PRN

## 2020-02-14 NOTE — Telephone Encounter (Signed)
I sent it  Use caution of sedation

## 2020-02-14 NOTE — Telephone Encounter (Signed)
Patient called. Patient is asking for a refill on Robaxin. Patient said it was prescribed for her at [facility]. It helps when she takes it after therapy.  Patient uses [facility].

## 2020-02-14 NOTE — Telephone Encounter (Signed)
Pt notified Rx sent and advised of Dr. Tower's comments  

## 2020-02-19 ENCOUNTER — Telehealth: Payer: Self-pay | Admitting: Family Medicine

## 2020-02-19 DIAGNOSIS — Z96651 Presence of right artificial knee joint: Secondary | ICD-10-CM

## 2020-02-19 DIAGNOSIS — Z9181 History of falling: Secondary | ICD-10-CM

## 2020-02-19 DIAGNOSIS — F329 Major depressive disorder, single episode, unspecified: Secondary | ICD-10-CM

## 2020-02-19 DIAGNOSIS — S72401D Unspecified fracture of lower end of right femur, subsequent encounter for closed fracture with routine healing: Secondary | ICD-10-CM

## 2020-02-19 DIAGNOSIS — K219 Gastro-esophageal reflux disease without esophagitis: Secondary | ICD-10-CM

## 2020-02-19 DIAGNOSIS — Z7902 Long term (current) use of antithrombotics/antiplatelets: Secondary | ICD-10-CM

## 2020-02-19 DIAGNOSIS — M9701XD Periprosthetic fracture around internal prosthetic right hip joint, subsequent encounter: Secondary | ICD-10-CM | POA: Diagnosis not present

## 2020-02-19 DIAGNOSIS — Z96612 Presence of left artificial shoulder joint: Secondary | ICD-10-CM

## 2020-02-19 DIAGNOSIS — Z87440 Personal history of urinary (tract) infections: Secondary | ICD-10-CM

## 2020-02-19 DIAGNOSIS — M1712 Unilateral primary osteoarthritis, left knee: Secondary | ICD-10-CM

## 2020-02-19 DIAGNOSIS — I10 Essential (primary) hypertension: Secondary | ICD-10-CM

## 2020-02-19 DIAGNOSIS — E785 Hyperlipidemia, unspecified: Secondary | ICD-10-CM

## 2020-02-19 DIAGNOSIS — D62 Acute posthemorrhagic anemia: Secondary | ICD-10-CM

## 2020-02-19 DIAGNOSIS — Z96642 Presence of left artificial hip joint: Secondary | ICD-10-CM

## 2020-02-19 DIAGNOSIS — Z8701 Personal history of pneumonia (recurrent): Secondary | ICD-10-CM

## 2020-02-19 DIAGNOSIS — I447 Left bundle-branch block, unspecified: Secondary | ICD-10-CM

## 2020-02-19 DIAGNOSIS — M461 Sacroiliitis, not elsewhere classified: Secondary | ICD-10-CM

## 2020-02-19 DIAGNOSIS — I119 Hypertensive heart disease without heart failure: Secondary | ICD-10-CM

## 2020-02-19 DIAGNOSIS — I700 Atherosclerosis of aorta: Secondary | ICD-10-CM

## 2020-02-19 DIAGNOSIS — M47816 Spondylosis without myelopathy or radiculopathy, lumbar region: Secondary | ICD-10-CM

## 2020-02-19 DIAGNOSIS — I2510 Atherosclerotic heart disease of native coronary artery without angina pectoris: Secondary | ICD-10-CM | POA: Diagnosis not present

## 2020-02-19 NOTE — Telephone Encounter (Signed)
-----   Message from Cloyd Stagers, RT sent at 02/09/2020  1:16 PM EDT ----- Regarding: Lab Orders for Tuesday 6.29.2021 Please place lab orders for Tuesday 6.29.2021, appt notes state "1-2 week f/u" Thank you, Dyke Maes RT(R)

## 2020-02-20 ENCOUNTER — Other Ambulatory Visit (INDEPENDENT_AMBULATORY_CARE_PROVIDER_SITE_OTHER): Payer: Medicare Other

## 2020-02-20 DIAGNOSIS — I10 Essential (primary) hypertension: Secondary | ICD-10-CM

## 2020-02-21 LAB — BASIC METABOLIC PANEL
BUN: 22 mg/dL (ref 6–23)
CO2: 25 mEq/L (ref 19–32)
Calcium: 9 mg/dL (ref 8.4–10.5)
Chloride: 99 mEq/L (ref 96–112)
Creatinine, Ser: 1.05 mg/dL (ref 0.40–1.20)
GFR: 50.36 mL/min — ABNORMAL LOW (ref >60.00)
Glucose, Bld: 104 mg/dL — ABNORMAL HIGH (ref 70–99)
Potassium: 4.5 mEq/L (ref 3.5–5.1)
Sodium: 132 mEq/L — ABNORMAL LOW (ref 135–145)

## 2020-03-07 ENCOUNTER — Telehealth: Payer: Self-pay | Admitting: Hematology and Oncology

## 2020-03-07 NOTE — Telephone Encounter (Signed)
Per provider rescheduled 7/16 appt, called and left a msg for pt

## 2020-03-08 ENCOUNTER — Inpatient Hospital Stay: Payer: Medicare Other | Admitting: Hematology and Oncology

## 2020-03-08 ENCOUNTER — Inpatient Hospital Stay: Payer: Medicare Other

## 2020-03-08 ENCOUNTER — Other Ambulatory Visit: Payer: Self-pay

## 2020-03-08 ENCOUNTER — Inpatient Hospital Stay: Payer: Medicare Other | Attending: Hematology and Oncology

## 2020-03-08 DIAGNOSIS — M353 Polymyalgia rheumatica: Secondary | ICD-10-CM | POA: Diagnosis present

## 2020-03-08 DIAGNOSIS — D638 Anemia in other chronic diseases classified elsewhere: Secondary | ICD-10-CM | POA: Diagnosis present

## 2020-03-08 LAB — CMP (CANCER CENTER ONLY)
ALT: 19 U/L (ref 0–44)
AST: 21 U/L (ref 15–41)
Albumin: 3.4 g/dL — ABNORMAL LOW (ref 3.5–5.0)
Alkaline Phosphatase: 68 U/L (ref 38–126)
Anion gap: 10 (ref 5–15)
BUN: 18 mg/dL (ref 8–23)
CO2: 23 mmol/L (ref 22–32)
Calcium: 9.1 mg/dL (ref 8.9–10.3)
Chloride: 104 mmol/L (ref 98–111)
Creatinine: 0.93 mg/dL (ref 0.44–1.00)
GFR, Est AFR Am: >60 mL/min (ref >60)
GFR, Estimated: 58 mL/min — ABNORMAL LOW (ref >60)
Glucose, Bld: 84 mg/dL (ref 70–99)
Potassium: 4.2 mmol/L (ref 3.5–5.1)
Sodium: 137 mmol/L (ref 135–145)
Total Bilirubin: 0.3 mg/dL (ref 0.3–1.2)
Total Protein: 6.7 g/dL (ref 6.5–8.1)

## 2020-03-08 LAB — CBC WITH DIFFERENTIAL (CANCER CENTER ONLY)
Abs Immature Granulocytes: 0.06 10*3/uL (ref 0.00–0.07)
Basophils Absolute: 0.1 10*3/uL (ref 0.0–0.1)
Basophils Relative: 1 %
Eosinophils Absolute: 0.1 10*3/uL (ref 0.0–0.5)
Eosinophils Relative: 1 %
HCT: 31.9 % — ABNORMAL LOW (ref 36.0–46.0)
Hemoglobin: 9.9 g/dL — ABNORMAL LOW (ref 12.0–15.0)
Immature Granulocytes: 1 %
Lymphocytes Relative: 20 %
Lymphs Abs: 1.9 10*3/uL (ref 0.7–4.0)
MCH: 27.7 pg (ref 26.0–34.0)
MCHC: 31 g/dL (ref 30.0–36.0)
MCV: 89.1 fL (ref 80.0–100.0)
Monocytes Absolute: 0.8 10*3/uL (ref 0.1–1.0)
Monocytes Relative: 8 %
Neutro Abs: 6.9 10*3/uL (ref 1.7–7.7)
Neutrophils Relative %: 69 %
Platelet Count: 290 10*3/uL (ref 150–400)
RBC: 3.58 MIL/uL — ABNORMAL LOW (ref 3.87–5.11)
RDW: 13.2 % (ref 11.5–15.5)
WBC Count: 9.7 10*3/uL (ref 4.0–10.5)
nRBC: 0 % (ref 0.0–0.2)

## 2020-03-08 MED ORDER — EPOETIN ALFA-EPBX 40000 UNIT/ML IJ SOLN
40000.0000 [IU] | Freq: Once | INTRAMUSCULAR | Status: AC
  Administered 2020-03-08: 40000 [IU] via SUBCUTANEOUS

## 2020-03-08 MED ORDER — HYDROCODONE-ACETAMINOPHEN 5-325 MG PO TABS: 1.0000 | ORAL_TABLET | Freq: Three times a day (TID) | ORAL | 0 refills | Status: DC | PRN

## 2020-03-08 MED ORDER — EPOETIN ALFA-EPBX 40000 UNIT/ML IJ SOLN
INTRAMUSCULAR | Status: AC
  Filled 2020-03-08: qty 1

## 2020-03-08 NOTE — Assessment & Plan Note (Signed)
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  Lab review 10/12/2017: Hemoglobin 10.2, MCV 94, normal WBC and platelet counts, creatinine 1.02 04/13/2019:Hemoglobin 9 05/19/2019:Hemoglobin 9.1 07/13/2019: Hemoglobin 10.3 08/24/2019:Hemoglobin 10.2 02/08/2020: hemoglobin 10.2 03/08/20: Hemoglobin   Current treatment:Retacrit 40,000 unitscurrentlyreceiving itevery 3-4 weeks.    RTC every 3 weeks for retacrit and every 6 weeks for follow up with me

## 2020-03-08 NOTE — Patient Instructions (Signed)

## 2020-03-08 NOTE — Progress Notes (Signed)
Patient Care Team: Tower, [name], MD as PCP - General Ellyn Hack Leonie Green, MD as PCP - Cardiology (Cardiology) Suella Broad, MD as Consulting Physician (Physical Medicine and Rehabilitation) Gavin Pound, MD as Consulting Physician (Rheumatology) Haverstock, Jennefer Bravo, MD as Referring Physician (Dermatology) Nicholas Lose, MD as Consulting Physician (Hematology and Oncology)  DIAGNOSIS:    ICD-10-CM   1. Anemia of chronic disease  D63.8     CHIEF COMPLIANT: Follow-up of anemia  INTERVAL HISTORY: [name] [name] is a 79 y.o. with above-mentioned history of anemia whocurrentlyreceives Retacrit. She presents to the clinic todayfor follow-up and Retacrit.  ALLERGIES:  is allergic to sulfonamide derivatives, ace inhibitors, hydrochlorothiazide, oxaprozin, pregabalin, and amoxicillin-pot clavulanate.  MEDICATIONS:  Current Outpatient Medications  Medication Sig Dispense Refill  . acetaminophen (TYLENOL) 500 MG tablet Take 1,000 mg by mouth 2 (two) times daily as needed for moderate pain or headache.     Marland Kitchen atorvastatin (LIPITOR) 20 MG tablet TAKE 1 TABLET BY MOUTH EVERY DAY (Patient taking differently: Take 20 mg by mouth daily. ) 90 tablet 3  . busPIRone (BUSPAR) 15 MG tablet Take 1 tablet (15 mg total) by mouth daily. 90 tablet 3  . cetirizine (ZYRTEC) 10 MG tablet Take 1 tablet (10 mg total) by mouth daily. 30 tablet 11  . clopidogrel (PLAVIX) 75 MG tablet TAKE 1 TABLET BY MOUTH EVERY DAY WITH BREAKFAST (Patient taking differently: Take 75 mg by mouth daily. ) 90 tablet 3  . enoxaparin (LOVENOX) 40 MG/0.4ML injection Inject 0.4 mLs (40 mg total) into the skin daily for 26 days. 10.4 mL 0  . fluticasone (FLONASE) 50 MCG/ACT nasal spray Place 2 sprays into both nostrils daily. (Patient taking differently: Place 2 sprays into both nostrils daily as needed for allergies. ) 16 g 6  . losartan (COZAAR) 25 MG tablet Take 1 tablet (25 mg total) by mouth daily. 90 tablet 3  . Melatonin 3  MG CAPS Take 18 mg by mouth at bedtime.     . methocarbamol (ROBAXIN) 500 MG tablet Take 1 tablet (500 mg total) by mouth every 8 (eight) hours as needed for muscle spasms. 30 tablet 1  . metoprolol tartrate (LOPRESSOR) 25 MG tablet TAKE 1 TABLET BY MOUTH TWICE A DAY (Patient taking differently: Take 25 mg by mouth 2 (two) times daily. ) 180 tablet 1  . pantoprazole (PROTONIX) 40 MG tablet Take 1 tablet (40 mg total) by mouth daily. 90 tablet 1  . sertraline (ZOLOFT) 25 MG tablet Take 1 tablet (25 mg total) by mouth daily. 90 tablet 1  . Vitamin D, Cholecalciferol, 25 MCG (1000 UT) CAPS Take 2,000 Units by mouth in the morning and at bedtime.      No current facility-administered medications for this visit.    PHYSICAL EXAMINATION: ECOG PERFORMANCE STATUS: 1 - Symptomatic but completely ambulatory  There were no vitals filed for this visit. There were no vitals filed for this visit.  LABORATORY DATA:  I have reviewed the data as listed CMP Latest Ref Rng & Units 02/20/2020 02/08/2020 02/06/2020  Glucose 70 - 99 mg/dL 104(H) 102(H) -  BUN 6 - 23 mg/dL 22 19 -  Creatinine 0.40 - 1.20 mg/dL 1.05 1.04(H) 1.1  Sodium 135 - 145 mEq/L 132(L) 132(L) 131(A)  Potassium 3.5 - 5.1 mEq/L 4.5 4.1 -  Chloride 96 - 112 mEq/L 99 98 -  CO2 19 - 32 mEq/L 25 25 -  Calcium 8.4 - 10.5 mg/dL 9.0 9.1 -  Total Protein  6.5 - 8.1 g/dL - 6.7 -  Total Bilirubin 0.3 - 1.2 mg/dL - 0.4 -  Alkaline Phos 38 - 126 U/L - 97 -  AST 15 - 41 U/L - 20 -  ALT 0 - 44 U/L - 17 -    Lab Results  Component Value Date   WBC 9.7 03/08/2020   HGB 9.9 (L) 03/08/2020   HCT 31.9 (L) 03/08/2020   MCV 89.1 03/08/2020   PLT 290 03/08/2020   NEUTROABS 6.9 03/08/2020    ASSESSMENT & PLAN:  Anemia of chronic disease 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 (Inj if Hb is less than 10.4)    RTC every 4 weeks for retacrit and every 8 weeks for follow up with me    No orders of the defined types were placed in this encounter.  The patient has a good understanding of the overall plan. she agrees with it. she will call with any problems that may develop before the next visit here.  Total time spent: 20 mins including face to face time and time spent for planning, charting and coordination of care  Nicholas Lose, MD 03/08/2020  I, Cloyde Reams Dorshimer, am acting as scribe for Dr. Nicholas Lose.  I have reviewed the above documentation for accuracy and completeness, and I agree with the above.

## 2020-03-11 ENCOUNTER — Telehealth: Payer: Self-pay | Admitting: Hematology and Oncology

## 2020-03-11 NOTE — Telephone Encounter (Signed)
Scheduled per 7/16 los. Called and spoke with pt, confirmed 9/9 appt

## 2020-03-21 ENCOUNTER — Telehealth: Payer: Self-pay

## 2020-03-21 NOTE — Telephone Encounter (Signed)
Please ok that verbal order  

## 2020-03-21 NOTE — Telephone Encounter (Signed)
Amy PT with [facility] requesting verbal orders for [facility] 1 x a wk for next wk for reassessment of pt.

## 2020-03-21 NOTE — Telephone Encounter (Signed)
Verbal order given to Amy

## 2020-04-03 ENCOUNTER — Other Ambulatory Visit: Payer: Self-pay | Admitting: Family Medicine

## 2020-04-04 ENCOUNTER — Inpatient Hospital Stay: Payer: Medicare Other

## 2020-04-04 ENCOUNTER — Inpatient Hospital Stay: Payer: Medicare Other | Attending: Hematology and Oncology

## 2020-04-04 ENCOUNTER — Other Ambulatory Visit: Payer: Self-pay

## 2020-04-04 DIAGNOSIS — D638 Anemia in other chronic diseases classified elsewhere: Secondary | ICD-10-CM | POA: Insufficient documentation

## 2020-04-04 DIAGNOSIS — M353 Polymyalgia rheumatica: Secondary | ICD-10-CM | POA: Diagnosis present

## 2020-04-04 LAB — CMP (CANCER CENTER ONLY)
ALT: 14 U/L (ref 0–44)
AST: 17 U/L (ref 15–41)
Albumin: 3.6 g/dL (ref 3.5–5.0)
Alkaline Phosphatase: 78 U/L (ref 38–126)
Anion gap: 10 (ref 5–15)
BUN: 25 mg/dL — ABNORMAL HIGH (ref 8–23)
CO2: 22 mmol/L (ref 22–32)
Calcium: 9.8 mg/dL (ref 8.9–10.3)
Chloride: 105 mmol/L (ref 98–111)
Creatinine: 1.1 mg/dL — ABNORMAL HIGH (ref 0.44–1.00)
GFR, Est AFR Am: 55 mL/min — ABNORMAL LOW (ref >60)
GFR, Estimated: 47 mL/min — ABNORMAL LOW (ref >60)
Glucose, Bld: 80 mg/dL (ref 70–99)
Potassium: 4.8 mmol/L (ref 3.5–5.1)
Sodium: 137 mmol/L (ref 135–145)
Total Bilirubin: 0.4 mg/dL (ref 0.3–1.2)
Total Protein: 7.2 g/dL (ref 6.5–8.1)

## 2020-04-04 LAB — CBC WITH DIFFERENTIAL (CANCER CENTER ONLY)
Abs Immature Granulocytes: 0.02 10*3/uL (ref 0.00–0.07)
Basophils Absolute: 0.1 10*3/uL (ref 0.0–0.1)
Basophils Relative: 1 %
Eosinophils Absolute: 0.1 10*3/uL (ref 0.0–0.5)
Eosinophils Relative: 1 %
HCT: 35.4 % — ABNORMAL LOW (ref 36.0–46.0)
Hemoglobin: 10.9 g/dL — ABNORMAL LOW (ref 12.0–15.0)
Immature Granulocytes: 0 %
Lymphocytes Relative: 21 %
Lymphs Abs: 2.1 10*3/uL (ref 0.7–4.0)
MCH: 26.8 pg (ref 26.0–34.0)
MCHC: 30.8 g/dL (ref 30.0–36.0)
MCV: 87.2 fL (ref 80.0–100.0)
Monocytes Absolute: 0.6 10*3/uL (ref 0.1–1.0)
Monocytes Relative: 6 %
Neutro Abs: 6.9 10*3/uL (ref 1.7–7.7)
Neutrophils Relative %: 71 %
Platelet Count: 297 10*3/uL (ref 150–400)
RBC: 4.06 MIL/uL (ref 3.87–5.11)
RDW: 13.6 % (ref 11.5–15.5)
WBC Count: 9.8 10*3/uL (ref 4.0–10.5)
nRBC: 0 % (ref 0.0–0.2)

## 2020-04-04 LAB — TYPE AND SCREEN
ABO/RH(D): A NEG
Antibody Screen: POSITIVE

## 2020-04-04 NOTE — Telephone Encounter (Signed)
F/u scheduled on 05/06/20, last filled on 02/14/20 #30 tabs with 1 refill

## 2020-04-04 NOTE — Progress Notes (Signed)
Per parameters pt does not get retacrit injection today

## 2020-04-23 ENCOUNTER — Encounter: Payer: Self-pay | Admitting: Family Medicine

## 2020-04-23 DIAGNOSIS — Z955 Presence of coronary angioplasty implant and graft: Secondary | ICD-10-CM

## 2020-04-23 DIAGNOSIS — I700 Atherosclerosis of aorta: Secondary | ICD-10-CM

## 2020-04-23 DIAGNOSIS — Z87440 Personal history of urinary (tract) infections: Secondary | ICD-10-CM

## 2020-04-23 DIAGNOSIS — K219 Gastro-esophageal reflux disease without esophagitis: Secondary | ICD-10-CM

## 2020-04-23 DIAGNOSIS — Z96612 Presence of left artificial shoulder joint: Secondary | ICD-10-CM

## 2020-04-23 DIAGNOSIS — Z96643 Presence of artificial hip joint, bilateral: Secondary | ICD-10-CM

## 2020-04-23 DIAGNOSIS — Z7902 Long term (current) use of antithrombotics/antiplatelets: Secondary | ICD-10-CM

## 2020-04-23 DIAGNOSIS — Z9181 History of falling: Secondary | ICD-10-CM

## 2020-04-23 DIAGNOSIS — I447 Left bundle-branch block, unspecified: Secondary | ICD-10-CM

## 2020-04-23 DIAGNOSIS — I2510 Atherosclerotic heart disease of native coronary artery without angina pectoris: Secondary | ICD-10-CM | POA: Diagnosis not present

## 2020-04-23 DIAGNOSIS — M1712 Unilateral primary osteoarthritis, left knee: Secondary | ICD-10-CM

## 2020-04-23 DIAGNOSIS — D62 Acute posthemorrhagic anemia: Secondary | ICD-10-CM

## 2020-04-23 DIAGNOSIS — Z8701 Personal history of pneumonia (recurrent): Secondary | ICD-10-CM

## 2020-04-23 DIAGNOSIS — M47816 Spondylosis without myelopathy or radiculopathy, lumbar region: Secondary | ICD-10-CM

## 2020-04-23 DIAGNOSIS — Z96651 Presence of right artificial knee joint: Secondary | ICD-10-CM

## 2020-04-23 DIAGNOSIS — M461 Sacroiliitis, not elsewhere classified: Secondary | ICD-10-CM

## 2020-04-23 DIAGNOSIS — M9701XD Periprosthetic fracture around internal prosthetic right hip joint, subsequent encounter: Secondary | ICD-10-CM | POA: Diagnosis not present

## 2020-04-23 DIAGNOSIS — S72491D Other fracture of lower end of right femur, subsequent encounter for closed fracture with routine healing: Secondary | ICD-10-CM | POA: Diagnosis not present

## 2020-04-23 DIAGNOSIS — F329 Major depressive disorder, single episode, unspecified: Secondary | ICD-10-CM

## 2020-04-23 DIAGNOSIS — I119 Hypertensive heart disease without heart failure: Secondary | ICD-10-CM

## 2020-04-23 DIAGNOSIS — E785 Hyperlipidemia, unspecified: Secondary | ICD-10-CM

## 2020-04-23 MED ORDER — BUSPIRONE HCL 15 MG PO TABS: 15.0000 mg | ORAL_TABLET | Freq: Every day | ORAL | 3 refills | Status: DC

## 2020-05-01 ENCOUNTER — Other Ambulatory Visit: Payer: Self-pay

## 2020-05-01 DIAGNOSIS — D638 Anemia in other chronic diseases classified elsewhere: Secondary | ICD-10-CM

## 2020-05-01 NOTE — Progress Notes (Signed)
Patient Care Team: Tower, [name], MD as PCP - General Ellyn Hack Leonie Green, MD as PCP - Cardiology (Cardiology) Suella Broad, MD as Consulting Physician (Physical Medicine and Rehabilitation) Gavin Pound, MD as Consulting Physician (Rheumatology) Haverstock, Jennefer Bravo, MD as Referring Physician (Dermatology) Nicholas Lose, MD as Consulting Physician (Hematology and Oncology)  DIAGNOSIS:    ICD-10-CM   1. Anemia of chronic disease  D63.8     CHIEF COMPLIANT: Follow-up of anemia  INTERVAL HISTORY: [name] [name] is a 78 y.o. with above-mentioned history of anemia whocurrentlyreceives Retacrit.She presents to the clinic todayfor follow-upand Retacrit.  She tells me that she does feel fatigued but she is able to walk with the help of a walker at home.  She is using a wheelchair in the clinic today.  She has not received injection last month because her hemoglobin was 10.9.  ALLERGIES:  is allergic to sulfonamide derivatives, ace inhibitors, hydrochlorothiazide, oxaprozin, pregabalin, and amoxicillin-pot clavulanate.  MEDICATIONS:  Current Outpatient Medications  Medication Sig Dispense Refill  . acetaminophen (TYLENOL) 500 MG tablet Take 1,000 mg by mouth 2 (two) times daily as needed for moderate pain or headache.     Marland Kitchen atorvastatin (LIPITOR) 20 MG tablet TAKE 1 TABLET BY MOUTH EVERY DAY (Patient taking differently: Take 20 mg by mouth daily. ) 90 tablet 3  . busPIRone (BUSPAR) 15 MG tablet Take 1 tablet (15 mg total) by mouth daily. 90 tablet 3  . cetirizine (ZYRTEC) 10 MG tablet Take 1 tablet (10 mg total) by mouth daily. 30 tablet 11  . clopidogrel (PLAVIX) 75 MG tablet TAKE 1 TABLET BY MOUTH EVERY DAY WITH BREAKFAST (Patient taking differently: Take 75 mg by mouth daily. ) 90 tablet 3  . HYDROcodone-acetaminophen (NORCO/VICODIN) 5-325 MG tablet Take 1 tablet by mouth 3 (three) times daily as needed for moderate pain. 30 tablet 0  . losartan (COZAAR) 25 MG tablet Take 1  tablet (25 mg total) by mouth daily. 90 tablet 3  . Melatonin 3 MG CAPS Take 18 mg by mouth at bedtime.     . methocarbamol (ROBAXIN) 500 MG tablet TAKE 1 TABLET BY MOUTH EVERY 8 HOURS AS NEEDED FOR MUSCLE SPASMS. 30 tablet 1  . metoprolol tartrate (LOPRESSOR) 25 MG tablet TAKE 1 TABLET BY MOUTH TWICE A DAY (Patient taking differently: Take 25 mg by mouth 2 (two) times daily. ) 180 tablet 1  . pantoprazole (PROTONIX) 40 MG tablet Take 1 tablet (40 mg total) by mouth daily. 90 tablet 1  . sertraline (ZOLOFT) 25 MG tablet Take 1 tablet (25 mg total) by mouth daily. 90 tablet 1  . Vitamin D, Cholecalciferol, 25 MCG (1000 UT) CAPS Take 2,000 Units by mouth in the morning and at bedtime.      No current facility-administered medications for this visit.    PHYSICAL EXAMINATION: ECOG PERFORMANCE STATUS: 1 - Symptomatic but completely ambulatory  Vitals:   05/02/20 1209  BP: 138/67  Pulse: (!) 57  Resp: 17  Temp: (!) 97.5 F (36.4 C)  SpO2: 97%   Filed Weights   05/02/20 1209  Weight: 172 lb 9.6 oz (78.3 kg)    LABORATORY DATA:  I have reviewed the data as listed CMP Latest Ref Rng & Units 04/04/2020 03/08/2020 02/20/2020  Glucose 70 - 99 mg/dL 80 84 104(H)  BUN 8 - 23 mg/dL 25(H) 18 22  Creatinine 0.44 - 1.00 mg/dL 1.10(H) 0.93 1.05  Sodium 135 - 145 mmol/L 137 137 132(L)  Potassium 3.5 -  5.1 mmol/L 4.8 4.2 4.5  Chloride 98 - 111 mmol/L 105 104 99  CO2 22 - 32 mmol/L 22 23 25   Calcium 8.9 - 10.3 mg/dL 9.8 9.1 9.0  Total Protein 6.5 - 8.1 g/dL 7.2 6.7 -  Total Bilirubin 0.3 - 1.2 mg/dL 0.4 0.3 -  Alkaline Phos 38 - 126 U/L 78 68 -  AST 15 - 41 U/L 17 21 -  ALT 0 - 44 U/L 14 19 -    Lab Results  Component Value Date   WBC 5.6 05/02/2020   HGB 10.2 (L) 05/02/2020   HCT 32.6 (L) 05/02/2020   MCV 86.7 05/02/2020   PLT 213 05/02/2020   NEUTROABS 3.5 05/02/2020    ASSESSMENT & PLAN:  Anemia of chronic disease 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 (  anemia of chronic inflammation), patient has chronic polymyalgia rheumatica  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 (Inj if Hb is less than 10.4)   RTC every 4 weeks for retacrit and every 8 weeks for follow up with me    No orders of the defined types were placed in this encounter.  The patient has a good understanding of the overall plan. she agrees with it. she will call with any problems that may develop before the next visit here.  Total time spent: 20 mins including face to face time and time spent for planning, charting and coordination of care  Nicholas Lose, MD 05/02/2020  I, Cloyde Reams Dorshimer, am acting as scribe for Dr. Nicholas Lose.  I have reviewed the above documentation for accuracy and completeness, and I agree with the above.

## 2020-05-02 ENCOUNTER — Other Ambulatory Visit: Payer: Medicare Other

## 2020-05-02 ENCOUNTER — Inpatient Hospital Stay: Payer: Medicare Other

## 2020-05-02 ENCOUNTER — Other Ambulatory Visit: Payer: Self-pay

## 2020-05-02 ENCOUNTER — Inpatient Hospital Stay: Payer: Medicare Other | Admitting: Hematology and Oncology

## 2020-05-02 ENCOUNTER — Inpatient Hospital Stay: Payer: Medicare Other | Attending: Hematology and Oncology

## 2020-05-02 DIAGNOSIS — M353 Polymyalgia rheumatica: Secondary | ICD-10-CM | POA: Insufficient documentation

## 2020-05-02 DIAGNOSIS — D538 Other specified nutritional anemias: Secondary | ICD-10-CM | POA: Insufficient documentation

## 2020-05-02 DIAGNOSIS — D638 Anemia in other chronic diseases classified elsewhere: Secondary | ICD-10-CM | POA: Diagnosis present

## 2020-05-02 LAB — CMP (CANCER CENTER ONLY)
ALT: 13 U/L (ref 0–44)
AST: 20 U/L (ref 15–41)
Albumin: 3.5 g/dL (ref 3.5–5.0)
Alkaline Phosphatase: 69 U/L (ref 38–126)
Anion gap: 5 (ref 5–15)
BUN: 22 mg/dL (ref 8–23)
CO2: 25 mmol/L (ref 22–32)
Calcium: 9.1 mg/dL (ref 8.9–10.3)
Chloride: 109 mmol/L (ref 98–111)
Creatinine: 1.09 mg/dL — ABNORMAL HIGH (ref 0.44–1.00)
GFR, Est AFR Am: 56 mL/min — ABNORMAL LOW (ref >60)
GFR, Estimated: 48 mL/min — ABNORMAL LOW (ref >60)
Glucose, Bld: 94 mg/dL (ref 70–99)
Potassium: 4.4 mmol/L (ref 3.5–5.1)
Sodium: 139 mmol/L (ref 135–145)
Total Bilirubin: 0.3 mg/dL (ref 0.3–1.2)
Total Protein: 7 g/dL (ref 6.5–8.1)

## 2020-05-02 LAB — CBC WITH DIFFERENTIAL (CANCER CENTER ONLY)
Abs Immature Granulocytes: 0.01 10*3/uL (ref 0.00–0.07)
Basophils Absolute: 0 10*3/uL (ref 0.0–0.1)
Basophils Relative: 1 %
Eosinophils Absolute: 0.1 10*3/uL (ref 0.0–0.5)
Eosinophils Relative: 2 %
HCT: 32.6 % — ABNORMAL LOW (ref 36.0–46.0)
Hemoglobin: 10.2 g/dL — ABNORMAL LOW (ref 12.0–15.0)
Immature Granulocytes: 0 %
Lymphocytes Relative: 26 %
Lymphs Abs: 1.5 10*3/uL (ref 0.7–4.0)
MCH: 27.1 pg (ref 26.0–34.0)
MCHC: 31.3 g/dL (ref 30.0–36.0)
MCV: 86.7 fL (ref 80.0–100.0)
Monocytes Absolute: 0.5 10*3/uL (ref 0.1–1.0)
Monocytes Relative: 9 %
Neutro Abs: 3.5 10*3/uL (ref 1.7–7.7)
Neutrophils Relative %: 62 %
Platelet Count: 213 10*3/uL (ref 150–400)
RBC: 3.76 MIL/uL — ABNORMAL LOW (ref 3.87–5.11)
RDW: 14.1 % (ref 11.5–15.5)
WBC Count: 5.6 10*3/uL (ref 4.0–10.5)
nRBC: 0 % (ref 0.0–0.2)

## 2020-05-02 LAB — SAMPLE TO BLOOD BANK

## 2020-05-02 MED ORDER — EPOETIN ALFA-EPBX 40000 UNIT/ML IJ SOLN
40000.0000 [IU] | Freq: Once | INTRAMUSCULAR | Status: AC
  Administered 2020-05-02: 40000 [IU] via SUBCUTANEOUS

## 2020-05-02 MED ORDER — EPOETIN ALFA-EPBX 40000 UNIT/ML IJ SOLN
INTRAMUSCULAR | Status: AC
  Filled 2020-05-02: qty 1

## 2020-05-02 NOTE — Patient Instructions (Signed)

## 2020-05-02 NOTE — Assessment & Plan Note (Signed)
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 (Inj if Hb is less than 10.4)   RTC every 4 weeks for retacrit and every 8 weeks for follow up with me

## 2020-05-03 ENCOUNTER — Telehealth: Payer: Self-pay | Admitting: Hematology and Oncology

## 2020-05-03 NOTE — Telephone Encounter (Signed)
Scheduled appts per 9/9 los. Pt confirmed appt dates and times.

## 2020-05-06 ENCOUNTER — Encounter: Payer: Self-pay | Admitting: Family Medicine

## 2020-05-06 ENCOUNTER — Ambulatory Visit: Payer: Medicare Other | Admitting: Family Medicine

## 2020-05-06 ENCOUNTER — Other Ambulatory Visit: Payer: Self-pay

## 2020-05-06 VITALS — BP 132/72 | HR 68 | Temp 97.3°F | Ht 63.0 in | Wt 175.3 lb

## 2020-05-06 DIAGNOSIS — I10 Essential (primary) hypertension: Secondary | ICD-10-CM | POA: Diagnosis not present

## 2020-05-06 DIAGNOSIS — E669 Obesity, unspecified: Secondary | ICD-10-CM

## 2020-05-06 DIAGNOSIS — Z23 Encounter for immunization: Secondary | ICD-10-CM

## 2020-05-06 DIAGNOSIS — D638 Anemia in other chronic diseases classified elsewhere: Secondary | ICD-10-CM | POA: Diagnosis not present

## 2020-05-06 DIAGNOSIS — N289 Disorder of kidney and ureter, unspecified: Secondary | ICD-10-CM

## 2020-05-06 DIAGNOSIS — S72402S Unspecified fracture of lower end of left femur, sequela: Secondary | ICD-10-CM

## 2020-05-06 DIAGNOSIS — E871 Hypo-osmolality and hyponatremia: Secondary | ICD-10-CM | POA: Diagnosis not present

## 2020-05-06 MED ORDER — LOSARTAN POTASSIUM 25 MG PO TABS: 25.0000 mg | ORAL_TABLET | Freq: Every day | ORAL | 3 refills | Status: DC

## 2020-05-06 NOTE — Assessment & Plan Note (Signed)
Continues PT at home Making a good recovery  Walking well with walker

## 2020-05-06 NOTE — Assessment & Plan Note (Signed)
Stable wt  Appetite is fair  Family is cooking a healthier diet now - doing well with that

## 2020-05-06 NOTE — Assessment & Plan Note (Signed)
Doing well with losartan 25 mg (no diuretic) and fairly stable renal fxn  bp in fair control at this time  BP Readings from Last 1 Encounters:  05/06/20 132/72   No changes needed Most recent labs reviewed  Disc lifstyle change with low sodium diet and exercise

## 2020-05-06 NOTE — Progress Notes (Signed)
Subjective:    Patient ID: [name] [name], female    DOB: Jun 15, 1941, 79 y.o.   MRN: [id number]  This visit occurred during the SARS-CoV-2 public health emergency.  Safety protocols were in place, including screening questions prior to the visit, additional usage of staff PPE, and extensive cleaning of exam room while observing appropriate contact time as indicated for disinfecting solutions.    HPI Pt presents for f/u of chronic medical problems  Wt Readings from Last 3 Encounters:  05/06/20 175 lb 5 oz (79.5 kg)  05/02/20 172 lb 9.6 oz (78.3 kg)  03/08/20 174 lb 9.6 oz (79.2 kg)   31.06 kg/m   Feeling pretty well  Getting around fairly well with walker   (after femur fracture)  Getting stronger  Some pain issues - also trying out a cane / practicing  Therapist says she is doing well    HTN bp is stable today  No cp or palpitations or headaches or edema  No side effects to medicines  BP Readings from Last 3 Encounters:  05/06/20 132/72  05/02/20 138/67  03/08/20 (!) 170/65     Pulse Readings from Last 3 Encounters:  05/06/20 68  05/02/20 (!) 57  03/08/20 (!) 58   Renal insuff Lab Results  Component Value Date   CREATININE 1.09 (H) 05/02/2020   BUN 22 05/02/2020   NA 139 05/02/2020   K 4.4 05/02/2020   CL 109 05/02/2020   CO2 25 05/02/2020   On 1/2 dose of losartan hct then just losartan    Still seeing hematology for anemia-getting infusion once per month (did get to skip one)  Doing better overall  Lab Results  Component Value Date   WBC 5.6 05/02/2020   HGB 10.2 (L) 05/02/2020   HCT 32.6 (L) 05/02/2020   MCV 86.7 05/02/2020   PLT 213 05/02/2020   Mood -her family thinks her medication helps  Less discouraged and frustrated  Helps prevent SAD as well  buspar zoloft   Had flu shot today   Son is moving in with her (he also has arthritis) - this will benefit both of them   Appetite is stable  Family cooking for her -trying new foods    Patient Active Problem List   Diagnosis Date Noted  . Closed fracture of left distal femur ([place]) 02/04/2020  . Renal insufficiency 11/01/2019  . Hyponatremia 06/19/2019  . Pedal edema 10/04/2018  . Grief reaction 09/17/2018  . 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 Routine general medical examination at a health care facility 10/14/2017  . Preop cardiovascular exam 10/01/2017  . CAD S/P percutaneous coronary angioplasty 01/27/2017  . Aortic regurgitation 01/27/2017  . Dyspnea on exertion 08/19/2016  . Left bundle branch block (LBBB) on electrocardiogram 08/19/2016  . Estrogen deficiency 10/08/2015  . Chronic fatigue 08/20/2015  . Deficiency anemia 11/17/2013  . Encounter for Medicare annual wellness exam 07/17/2013  . PMR (polymyalgia rheumatica) (HCC) 07/17/2013  . Intertrigo 04/19/2013  . Expected blood loss anemia 04/05/2013  . Obesity (BMI 30-39.9) 04/05/2013  . 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 IBS (irritable bowel syndrome) 05/30/2012  . Anemia of chronic disease 04/16/2010  . Generalized anxiety disorder 04/16/2010  . GERD 03/04/2009  . Vitamin D deficiency 03/12/2008  . URINARY TRACT INFECTION, RECURRENT 08/07/2007  . 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 FIBROCYSTIC BREAST DISEASE, HX OF 02/11/2007  . Hyperlipidemia with low HDL 01/10/2007  . DEPRESSION 01/10/2007  . RETINAL VEIN OCCLUSION 01/10/2007  .  Essential hypertension 01/10/2007  . PEPTIC ULCER DISEASE 01/10/2007  . 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 SKIN CANCER, HX OF 01/10/2007   Past Medical History:  Diagnosis Date  . Anemia    of chronic disease  . Anxiety   . Arthritis    "psoratic arthritis; new dx" (09/11/2016)  . CAD S/P percutaneous coronary angioplasty 08/2016   mLCx 90% -> 0% (2.75 mm x 16 mm) Synergy DES PCI Ostial D1 70% (not PTCA target). Moderate ostial and mD2.  LAD tandem 50% stenoses w/ negative FFR (0.86)  . Cancer ([place])    skin cancer on leg  . Chronic lower back pain    . Complication of anesthesia 1980 and 1982   trouble waking up after breast biopsy, many surgeries since no problem  . Depression   . Dyspnea on effort - CHRONIC    partial relief of Sx post PCI of L Circumflex.  . Fibrocystic breast   . GERD (gastroesophageal reflux disease)   . Headache    "monthly" (09/11/2016)  . History of blood transfusion    "several since age 78; I've had them after most of my surgeries" (09/11/2016)  . History of myocardial infarction    "doctor said there are signs I've had a heart attack; don't know when"  - Myoview [name] 2018 indicated small Inferior - Inferoseptal Infarct (vs. rest ischemia). Echo with inferoseptal hypokinesis.  Marland Kitchen Hx of skin cancer, basal cell 2013 and 2012   right inner, lower leg; several scattered around my body  . Hypertension   . Iron deficiency anemia    "I've had an iron infusion"  . Osteoarthritis   . Osteopenia   . Peptic ulcer disease    H pylori  . PMR (polymyalgia rheumatica) (HCC)   . PONV (postoperative nausea and vomiting)   . Psoriasis    Past Surgical History:  Procedure Laterality Date  . APPENDECTOMY    . BACK SURGERY     x 6   . BREAST BIOPSY Left 1980 and 1982   "benign"  . CARDIAC CATHETERIZATION N/A 09/11/2016   Procedure: Left Heart Cath and Coronary Angiography;  Surgeon: Leonie Man, MD;  Location: [facility];  Service: Cardiovascular: mLCx 90% * (PCI). Ostial D1 70% (not PTCA target). Mod ostial and mD2.  LAD tandem 50% stenoses w/ negative FFR (0.86)  . CARDIAC CATHETERIZATION N/A 09/11/2016   Procedure: Intravascular Pressure Wire/FFR Study;  Surgeon: Leonie Man, MD;  Location: [facility];  Service: Cardiovascular: --  LAD tandem 50% stenoses w/ negative FFR (0.86  . CARDIAC CATHETERIZATION N/A 09/11/2016   Procedure: Coronary Stent Intervention;  Surgeon: Leonie Man, MD;  Location: [facility];  Service: Cardiovascular: -- DES PCI mLCx 90%--0% SYNERGY DES 2.75 MM X 16 MM.   Marland Kitchen CARDIAC CATHETERIZATION  2001   non-obstructive CAD  . CATARACT EXTRACTION W/ INTRAOCULAR LENS  IMPLANT, BILATERAL  July -  August 2017  . DILATION AND CURETTAGE OF UTERUS  X 3  . ESOPHAGOGASTRODUODENOSCOPY  03/2009   ulcer,HP, GERD stricture  . EXCISIONAL HEMORRHOIDECTOMY    . HIP ARTHROPLASTY Left 2010  . JOINT REPLACEMENT    . KNEE ARTHROSCOPY Bilateral   . LAPAROSCOPIC CHOLECYSTECTOMY  2005  . LUMBAR DISC SURGERY  11/1984; 1987; 1989; 2004; 2005 X 2   deg. disk lumber spine   . MOHS SURGERY Right    "inside my lower leg"  . NASAL SINUS SURGERY  1970s  . NM  MYOVIEW LTD  08/28/2016   INTERMEDIATE RISK (b/c reduced EF & ? small infarct) -- EF ~35-40%.  ? Prior Small Inferior-apical & septal infarct (w/o ischemia).  Difuse HK - worse in inferoapex.  Suggest prior infarct with no ischemia. Ischemic area correlates with echo WMA  . ORIF FEMUR FRACTURE Right 01/04/2020   Procedure: OPEN REDUCTION INTERNAL FIXATION (ORIF) DISTAL FEMUR FRACTURE;  Surgeon: Rod Can, MD;  Location: [facility];  Service: Orthopedics;  Laterality: Right;  . REVERSE SHOULDER ARTHROPLASTY Left 12/17/2017   Procedure: LEFT REVERSE SHOULDER ARTHROPLASTY;  Surgeon: Netta Cedars, MD;  Location: [facility];  Service: Orthopedics;  Laterality: Left;  . TOTAL HIP ARTHROPLASTY Right 04/04/2013   Procedure: RIGHT TOTAL HIP ARTHROPLASTY ANTERIOR APPROACH;  Surgeon: Mauri Pole, MD;  Location: [facility];  Service: Orthopedics;  Laterality: Right;  . TOTAL KNEE ARTHROPLASTY Right 2009  . TRANSTHORACIC ECHOCARDIOGRAM  08/2016   mild conc LVH. EF 45-50% - anteroseptal & inferoseptal HK. Mod AI.  Marland Kitchen TRANSTHORACIC ECHOCARDIOGRAM  08/2017   (post PCI) --> EF normalized:  Normal LV systolic function; mild diastolic dysfunction; mild   LVH; sclerotic aortic valve with mild AI.  No RWMA  . TUBAL LIGATION    . VAGINAL HYSTERECTOMY  1970s   partial    Social History   Tobacco Use  . Smoking status: Never Smoker  . Smokeless  tobacco: Never Used  Vaping Use  . Vaping Use: Never used  Substance Use Topics  . Alcohol use: Yes    Alcohol/week: 0.0 standard drinks    Comment: 09/11/2016 "might have a margarita q 3-4 months"  . Drug use: No   Family History  Problem Relation Age of Onset  . Arthritis Mother   . Emphysema Mother   . Hyperlipidemia Mother   . Hypertension Mother   . Heart disease Mother   . Heart disease Brother        CAD  . Diabetes Paternal Grandmother   . Heart disease Maternal Aunt    Allergies  Allergen Reactions  . 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 Ace Inhibitors     cough  . Hydrochlorothiazide     Hyponatremia   . Oxaprozin     GI upset   . Pregabalin Swelling    SWELLING REACTION UNSPECIFIED   . Amoxicillin-Pot Clavulanate Nausea And Vomiting    gi upset Has patient had a PCN reaction causing immediate rash, facial/tongue/throat swelling, SOB or lightheadedness with hypotension: No Has patient had a PCN reaction causing severe rash involving mucus membranes or skin necrosis: No Has patient had a PCN reaction that required hospitalization No Has patient had a PCN reaction occurring within the last 10 years: No If all of the above answers are "NO", then may proceed with Cephalosporin use.    Current Outpatient Medications on File Prior to Visit  Medication Sig Dispense Refill  . acetaminophen (TYLENOL) 500 MG tablet Take 1,000 mg by mouth 2 (two) times daily as needed for moderate pain or headache.     Marland Kitchen atorvastatin (LIPITOR) 20 MG tablet TAKE 1 TABLET BY MOUTH EVERY DAY (Patient taking differently: Take 20 mg by mouth daily. ) 90 tablet 3  . busPIRone (BUSPAR) 15 MG tablet Take 1 tablet (15 mg total) by mouth daily. 90 tablet 3  . cetirizine (ZYRTEC) 10 MG tablet Take 1 tablet (10 mg total) by mouth daily. 30 tablet 11  . clopidogrel (PLAVIX) 75 MG tablet TAKE  1 TABLET BY MOUTH EVERY DAY WITH BREAKFAST (Patient taking  differently: Take 75 mg by mouth daily. ) 90 tablet 3  . HYDROcodone-acetaminophen (NORCO/VICODIN) 5-325 MG tablet Take 1 tablet by mouth 3 (three) times daily as needed for moderate pain. 30 tablet 0  . Melatonin 3 MG CAPS Take 18 mg by mouth at bedtime.     . methocarbamol (ROBAXIN) 500 MG tablet TAKE 1 TABLET BY MOUTH EVERY 8 HOURS AS NEEDED FOR MUSCLE SPASMS. 30 tablet 1  . metoprolol tartrate (LOPRESSOR) 25 MG tablet TAKE 1 TABLET BY MOUTH TWICE A DAY (Patient taking differently: Take 25 mg by mouth 2 (two) times daily. ) 180 tablet 1  . pantoprazole (PROTONIX) 40 MG tablet Take 1 tablet (40 mg total) by mouth daily. 90 tablet 1  . sertraline (ZOLOFT) 25 MG tablet Take 1 tablet (25 mg total) by mouth daily. 90 tablet 1  . Vitamin D, Cholecalciferol, 25 MCG (1000 UT) CAPS Take 2,000 Units by mouth in the morning and at bedtime.      No current facility-administered medications on file prior to visit.     Review of Systems  Constitutional: Positive for fatigue. Negative for activity change, appetite change, fever and unexpected weight change.  HENT: Negative for congestion, ear pain, rhinorrhea, sinus pressure and sore throat.   Eyes: Negative for pain, redness and visual disturbance.  Respiratory: Negative for cough, shortness of breath and wheezing.   Cardiovascular: Negative for chest pain and palpitations.  Gastrointestinal: Negative for abdominal pain, blood in stool, constipation and diarrhea.  Endocrine: Negative for polydipsia and polyuria.  Genitourinary: Negative for dysuria, frequency and urgency.  Musculoskeletal: Positive for arthralgias. Negative for back pain and myalgias.  Skin: Negative for pallor and rash.  Allergic/Immunologic: Negative for environmental allergies.  Neurological: Negative for dizziness, syncope and headaches.  Hematological: Negative for adenopathy. Does not bruise/bleed easily.  Psychiatric/Behavioral: Negative for decreased concentration and  dysphoric mood. The patient is not nervous/anxious.        Mood is better       Objective:   Physical Exam Constitutional:      General: She is not in acute distress.    Appearance: Normal appearance. She is well-developed. She is obese. She is not ill-appearing.     Comments: Frail appearing  HENT:     Head: Normocephalic and atraumatic.  Eyes:     Conjunctiva/sclera: Conjunctivae normal.     Pupils: Pupils are equal, round, and reactive to light.  Neck:     Thyroid: No thyromegaly.     Vascular: No carotid bruit or JVD.  Cardiovascular:     Rate and Rhythm: Normal rate and regular rhythm.     Heart sounds: Normal heart sounds. No gallop.   Pulmonary:     Effort: Pulmonary effort is normal. No respiratory distress.     Breath sounds: Normal breath sounds. No wheezing or rales.  Abdominal:     General: Bowel sounds are normal. There is no distension or abdominal bruit.     Palpations: Abdomen is soft. There is no mass.     Tenderness: There is no abdominal tenderness.  Musculoskeletal:     Cervical back: Normal range of motion and neck supple.     Right lower leg: No edema.     Left lower leg: No edema.  Lymphadenopathy:     Cervical: No cervical adenopathy.  Skin:    General: Skin is warm and dry.     Coloration: Skin is  not pale.     Findings: No rash.  Neurological:     Mental Status: She is alert.     Cranial Nerves: No cranial nerve deficit.     Coordination: Coordination normal.     Deep Tendon Reflexes: Reflexes are normal and symmetric.     Comments: Steady gait with walker Can rise from chair un aided   Psychiatric:        Mood and Affect: Mood normal.     Comments: Pleasant  Talks more slowly than she used to            Assessment & Plan:   Problem List Items Addressed This Visit      Cardiovascular and Mediastinum   Essential hypertension - Primary (Chronic)    Doing well with losartan 25 mg (no diuretic) and fairly stable renal fxn  bp in  fair control at this time  BP Readings from Last 1 Encounters:  05/06/20 132/72   No changes needed Most recent labs reviewed  Disc lifstyle change with low sodium diet and exercise        Relevant Medications   losartan (COZAAR) 25 MG tablet     Musculoskeletal and Integument   Closed fracture of left distal femur (HCC)    Continues PT at home Making a good recovery  Walking well with walker         Genitourinary   Renal insufficiency    Reviewed last renal profile-fairly stable On losartan w/o hctz now and bp is controlled         Other   Anemia of chronic disease (Chronic)    Continues hematology tx ([facility]) Doing better        Obesity (BMI 30-39.9)    Stable wt  Appetite is fair  Family is cooking a healthier diet now - doing well with that       RESOLVED: Hyponatremia    Resolved off diuretic        Other Visit Diagnoses    Need for influenza vaccination       Relevant Orders   Flu Vaccine QUAD High Dose(Fluad) (Completed)

## 2020-05-06 NOTE — Assessment & Plan Note (Signed)
Resolved off diuretic

## 2020-05-06 NOTE — Assessment & Plan Note (Signed)
Continues hematology tx ([facility]) Doing better

## 2020-05-06 NOTE — Assessment & Plan Note (Signed)
Reviewed last renal profile-fairly stable On losartan w/o hctz now and bp is controlled

## 2020-05-06 NOTE — Patient Instructions (Signed)
Take care of yourself No medicine changes Flu shot today   Stay active- keep doing your physical therapy   Follow up for annual exam in march

## 2020-05-07 ENCOUNTER — Telehealth: Payer: Self-pay

## 2020-05-07 NOTE — Telephone Encounter (Signed)
Please ok that verbal order  

## 2020-05-07 NOTE — Telephone Encounter (Signed)
Left VM giving verbal order  

## 2020-05-07 NOTE — Telephone Encounter (Signed)
I received a phone call from [facility] with [facility] requesting a verbal order for PT 1x a week for 4 weeks. Is this ok? He can be reached back at [phone]

## 2020-05-28 ENCOUNTER — Other Ambulatory Visit: Payer: Self-pay | Admitting: Family Medicine

## 2020-05-28 NOTE — Telephone Encounter (Signed)
Last filled on 04/04/20 #30 tabs with 1 refill, CPE scheduled on 11/05/20

## 2020-05-30 ENCOUNTER — Other Ambulatory Visit: Payer: Self-pay

## 2020-05-30 ENCOUNTER — Inpatient Hospital Stay: Payer: Medicare Other

## 2020-05-30 ENCOUNTER — Inpatient Hospital Stay: Payer: Medicare Other | Attending: Hematology and Oncology

## 2020-05-30 DIAGNOSIS — M353 Polymyalgia rheumatica: Secondary | ICD-10-CM | POA: Diagnosis not present

## 2020-05-30 DIAGNOSIS — D638 Anemia in other chronic diseases classified elsewhere: Secondary | ICD-10-CM | POA: Diagnosis present

## 2020-05-30 LAB — CBC WITH DIFFERENTIAL (CANCER CENTER ONLY)
Abs Immature Granulocytes: 0.07 10*3/uL (ref 0.00–0.07)
Basophils Absolute: 0.1 10*3/uL (ref 0.0–0.1)
Basophils Relative: 1 %
Eosinophils Absolute: 0.1 10*3/uL (ref 0.0–0.5)
Eosinophils Relative: 2 %
HCT: 35.7 % — ABNORMAL LOW (ref 36.0–46.0)
Hemoglobin: 11 g/dL — ABNORMAL LOW (ref 12.0–15.0)
Immature Granulocytes: 1 %
Lymphocytes Relative: 27 %
Lymphs Abs: 2.4 10*3/uL (ref 0.7–4.0)
MCH: 26.8 pg (ref 26.0–34.0)
MCHC: 30.8 g/dL (ref 30.0–36.0)
MCV: 86.9 fL (ref 80.0–100.0)
Monocytes Absolute: 0.6 10*3/uL (ref 0.1–1.0)
Monocytes Relative: 7 %
Neutro Abs: 5.5 10*3/uL (ref 1.7–7.7)
Neutrophils Relative %: 62 %
Platelet Count: 264 10*3/uL (ref 150–400)
RBC: 4.11 MIL/uL (ref 3.87–5.11)
RDW: 14.5 % (ref 11.5–15.5)
WBC Count: 8.7 10*3/uL (ref 4.0–10.5)
nRBC: 0 % (ref 0.0–0.2)

## 2020-05-30 LAB — CMP (CANCER CENTER ONLY)
ALT: 19 U/L (ref 0–44)
AST: 22 U/L (ref 15–41)
Albumin: 3.6 g/dL (ref 3.5–5.0)
Alkaline Phosphatase: 72 U/L (ref 38–126)
Anion gap: 5 (ref 5–15)
BUN: 22 mg/dL (ref 8–23)
CO2: 27 mmol/L (ref 22–32)
Calcium: 9.4 mg/dL (ref 8.9–10.3)
Chloride: 106 mmol/L (ref 98–111)
Creatinine: 1.05 mg/dL — ABNORMAL HIGH (ref 0.44–1.00)
GFR, Estimated: 50 mL/min — ABNORMAL LOW (ref >60)
Glucose, Bld: 82 mg/dL (ref 70–99)
Potassium: 4.6 mmol/L (ref 3.5–5.1)
Sodium: 138 mmol/L (ref 135–145)
Total Bilirubin: 0.5 mg/dL (ref 0.3–1.2)
Total Protein: 7.2 g/dL (ref 6.5–8.1)

## 2020-05-30 NOTE — Progress Notes (Signed)
Pt did not receive Retacrit d/t Dr Geralyn Flash parameters of <10.4. Pt's hgb was 11.0. Pt received print out of labs.

## 2020-06-03 ENCOUNTER — Telehealth: Payer: Self-pay

## 2020-06-03 ENCOUNTER — Emergency Department (HOSPITAL_COMMUNITY): Payer: Medicare Other

## 2020-06-03 ENCOUNTER — Encounter (HOSPITAL_COMMUNITY): Payer: Self-pay | Admitting: *Deleted

## 2020-06-03 ENCOUNTER — Ambulatory Visit (HOSPITAL_COMMUNITY)
Admission: EM | Admit: 2020-06-03 | Discharge: 2020-06-03 | Disposition: A | Discharge status: Home / Self Care | Payer: Medicare Other

## 2020-06-03 ENCOUNTER — Other Ambulatory Visit: Payer: Self-pay

## 2020-06-03 ENCOUNTER — Observation Stay (HOSPITAL_COMMUNITY)
Admission: EM | Admit: 2020-06-03 | Discharge: 2020-06-04 | Disposition: A | Discharge status: Home / Self Care | Payer: Medicare Other | Attending: Internal Medicine | Admitting: Internal Medicine

## 2020-06-03 ENCOUNTER — Telehealth: Payer: Self-pay | Admitting: Cardiology

## 2020-06-03 DIAGNOSIS — Z20822 Contact with and (suspected) exposure to covid-19: Secondary | ICD-10-CM | POA: Insufficient documentation

## 2020-06-03 DIAGNOSIS — Z85828 Personal history of other malignant neoplasm of skin: Secondary | ICD-10-CM | POA: Diagnosis not present

## 2020-06-03 DIAGNOSIS — Z8673 Personal history of transient ischemic attack (TIA), and cerebral infarction without residual deficits: Secondary | ICD-10-CM | POA: Diagnosis present

## 2020-06-03 DIAGNOSIS — Z96641 Presence of right artificial hip joint: Secondary | ICD-10-CM | POA: Insufficient documentation

## 2020-06-03 DIAGNOSIS — R29810 Facial weakness: Secondary | ICD-10-CM

## 2020-06-03 DIAGNOSIS — I2510 Atherosclerotic heart disease of native coronary artery without angina pectoris: Secondary | ICD-10-CM | POA: Diagnosis not present

## 2020-06-03 DIAGNOSIS — Z96651 Presence of right artificial knee joint: Secondary | ICD-10-CM | POA: Insufficient documentation

## 2020-06-03 DIAGNOSIS — R0609 Other forms of dyspnea: Secondary | ICD-10-CM | POA: Diagnosis present

## 2020-06-03 DIAGNOSIS — D649 Anemia, unspecified: Secondary | ICD-10-CM

## 2020-06-03 DIAGNOSIS — G459 Transient cerebral ischemic attack, unspecified: Secondary | ICD-10-CM

## 2020-06-03 DIAGNOSIS — Z7901 Long term (current) use of anticoagulants: Secondary | ICD-10-CM | POA: Insufficient documentation

## 2020-06-03 DIAGNOSIS — Z79899 Other long term (current) drug therapy: Secondary | ICD-10-CM | POA: Diagnosis not present

## 2020-06-03 DIAGNOSIS — F411 Generalized anxiety disorder: Secondary | ICD-10-CM | POA: Diagnosis present

## 2020-06-03 DIAGNOSIS — E785 Hyperlipidemia, unspecified: Secondary | ICD-10-CM | POA: Diagnosis present

## 2020-06-03 DIAGNOSIS — N289 Disorder of kidney and ureter, unspecified: Secondary | ICD-10-CM

## 2020-06-03 DIAGNOSIS — I10 Essential (primary) hypertension: Secondary | ICD-10-CM | POA: Diagnosis present

## 2020-06-03 DIAGNOSIS — R202 Paresthesia of skin: Secondary | ICD-10-CM | POA: Diagnosis present

## 2020-06-03 HISTORY — DX: Transient cerebral ischemic attack, unspecified: G45.9

## 2020-06-03 LAB — CBC
HCT: 36.8 % (ref 36.0–46.0)
Hemoglobin: 11.2 g/dL — ABNORMAL LOW (ref 12.0–15.0)
MCH: 26.7 pg (ref 26.0–34.0)
MCHC: 30.4 g/dL (ref 30.0–36.0)
MCV: 87.8 fL (ref 80.0–100.0)
Platelets: 289 10*3/uL (ref 150–400)
RBC: 4.19 MIL/uL (ref 3.87–5.11)
RDW: 14.2 % (ref 11.5–15.5)
WBC: 8.6 10*3/uL (ref 4.0–10.5)
nRBC: 0 % (ref 0.0–0.2)

## 2020-06-03 LAB — DIFFERENTIAL
Abs Immature Granulocytes: 0.03 10*3/uL (ref 0.00–0.07)
Basophils Absolute: 0 10*3/uL (ref 0.0–0.1)
Basophils Relative: 1 %
Eosinophils Absolute: 0.1 10*3/uL (ref 0.0–0.5)
Eosinophils Relative: 1 %
Immature Granulocytes: 0 %
Lymphocytes Relative: 25 %
Lymphs Abs: 2.2 10*3/uL (ref 0.7–4.0)
Monocytes Absolute: 0.5 10*3/uL (ref 0.1–1.0)
Monocytes Relative: 6 %
Neutro Abs: 5.8 10*3/uL (ref 1.7–7.7)
Neutrophils Relative %: 67 %

## 2020-06-03 LAB — COMPREHENSIVE METABOLIC PANEL
ALT: 19 U/L (ref 0–44)
AST: 27 U/L (ref 15–41)
Albumin: 3.8 g/dL (ref 3.5–5.0)
Alkaline Phosphatase: 58 U/L (ref 38–126)
Anion gap: 12 (ref 5–15)
BUN: 24 mg/dL — ABNORMAL HIGH (ref 8–23)
CO2: 21 mmol/L — ABNORMAL LOW (ref 22–32)
Calcium: 9.3 mg/dL (ref 8.9–10.3)
Chloride: 105 mmol/L (ref 98–111)
Creatinine, Ser: 1.32 mg/dL — ABNORMAL HIGH (ref 0.44–1.00)
GFR, Estimated: 38 mL/min — ABNORMAL LOW (ref >60)
Glucose, Bld: 142 mg/dL — ABNORMAL HIGH (ref 70–99)
Potassium: 4.1 mmol/L (ref 3.5–5.1)
Sodium: 138 mmol/L (ref 135–145)
Total Bilirubin: 0.6 mg/dL (ref 0.3–1.2)
Total Protein: 7 g/dL (ref 6.5–8.1)

## 2020-06-03 LAB — PROTIME-INR
INR: 1 (ref 0.8–1.2)
Prothrombin Time: 13.2 seconds (ref 11.4–15.2)

## 2020-06-03 LAB — APTT: aPTT: 28 seconds (ref 24–36)

## 2020-06-03 MED ORDER — SODIUM CHLORIDE 0.9% FLUSH: 3.0000 mL | Freq: Once | INTRAVENOUS | Status: DC

## 2020-06-03 NOTE — ED Triage Notes (Signed)
Pt arrives to ED due to a possible TIA event she had around 1pm yesterday that only lasted about 5-10 minutes. Pt states she was in the car with her daughter when suddenly her right hand felt numb her daughter noticed her speech was slurred. Pt said then her hand suddenly felt fine and her speech returned., she told her pcp about this today and recommended to go to ED. Currently pt does has a droopy appearance in right eye. Pt is alert and ox4 no other neuro deficits noticed in triage.

## 2020-06-03 NOTE — Telephone Encounter (Signed)
Her 2nd covid vaccine [employer] was [employer].  I do recommend a booster

## 2020-06-03 NOTE — Discharge Instructions (Addendum)
Please go to the ER  

## 2020-06-03 NOTE — Telephone Encounter (Signed)
Daughter notified. Daughter then proceeded to tell me pt has been having stroke sxs, so xfered to access nurse line to be triaged   FYI to PCP

## 2020-06-03 NOTE — Telephone Encounter (Signed)
Received call from daughter regarding patients visit at urgent care Was recommended patient go to ED secondary to drooping eye and hand weakness.  Daughter wanted to make appointment instead of going to ED and waiting 6 hours.  Strongly advised to go to ED for evaluation  Daughter wanted to make appointment instead. Explained to daughter if patient has been evaluated by provider that suggested ED that is the best place for her to go

## 2020-06-03 NOTE — Telephone Encounter (Signed)
Per chart review tab pt is at [facility] now.

## 2020-06-03 NOTE — Telephone Encounter (Signed)
Patient states she has numbness in the hand, slurred speech and dizziness. Daughter states it looks like her eye is drooping but is unsure if that is a new symptom.

## 2020-06-03 NOTE — Telephone Encounter (Signed)
I see that and will watch for correspondence

## 2020-06-03 NOTE — Telephone Encounter (Signed)
[facility] - Client TELEPHONE ADVICE RECORD [facility] Patient Name: [name] [name] Gender: Female DOB: 16-Oct-1940 Age: 79 Y 9 M 9 D Return Phone Number: [phone] (Primary) Address[place] [address] Client [facility] - Client Client Site [facility] Physician Glori Bickers, Roque Lias - MD Contact Type Call Who Is Calling Patient / Member / Family / Caregiver Call Type Triage / Clinical Caller Name Vonna Kotyk Relationship To Patient Daughter Return Phone Number [phone] (Primary) Chief Complaint SPEAK - sudden inability to talk or slurred speech Reason for Call Symptomatic / Request for [place] S[place] states her mother started talking incoherently yesterday. Her eye is droopy. She is concerned she may be having a stroke. [place] Translation No Nurse Assessment Nurse: Rock Nephew, RN, Juliann Pulse Date/Time (Eastern Time): 06/03/2020 12:13:02 PM Confirm and document reason for call. If symptomatic, describe symptoms. ---Caller states her mother started talking incoherently yesterday, she had a headache and her right hand was numb and her eye was droopy ( this lasted for a few minutes and then resolved ). She is concerned she may be having a stroke. Does the patient have any new or worsening symptoms? ---Yes Will a triage be completed? ---Yes Related visit to physician within the last 2 weeks? ---No Does the PT have any chronic conditions? (i.e. diabetes, asthma, this includes High risk factors for pregnancy, etc.) ---Yes List chronic conditions. ---HTN Is this a behavioral health or substance abuse call? ---No Guidelines Guideline Title Affirmed Question Affirmed Notes Nurse Date/Time (Eastern Time) Neurologic Deficit [1] Numbness (i.e., loss of sensation) of the face, arm / hand, or leg / foot on one side of the body AND [2]  sudden onset AND [3] brief (now gone) Rock Nephew, RN, Juliann Pulse 06/03/2020 12:15:21 PM Disp. Time Eilene Ghazi Time) Disposition Final User 06/03/2020 12:11:32 PM Send to Urgent [facility] PLEASE NOTE: All timestamps contained within this report are represented as [place] Standard Time. CONFIDENTIALTY NOTICE: This fax transmission is intended only for the addressee. It contains information that is legally privileged, confidential or otherwise protected from use or disclosure. If you are not the intended recipient, you are strictly prohibited from reviewing, disclosing, copying using or disseminating any of this information or taking any action in reliance on or regarding this information. If you have received this fax in error, please notify us immediately by telephone so that we can arrange for its return to [name]. Phone: [phone], Toll-Free: [phone], Fax: [phone] Page: 2 of 2 Call Id: [id number] 06/03/2020 12:23:17 PM Go to ED Now (or PCP triage) Yes Rock Nephew, RN, Gara Kroner Disagree/Comply Comply Caller Understands Yes PreDisposition Call Doctor Care Advice Given Per Guideline GO TO ED NOW (OR PCP TRIAGE): * IF NO PCP (PRIMARY CARE PROVIDER) SECOND-LEVEL TRIAGE: You need to be seen within the next hour. Go to the [facility] at _[facility] [address] [place] as soon as you can. CARE ADVICE given per Neurologic Deficit (Adult) guideline. Referrals GO TO FACILITY OTHER - SPECIFY

## 2020-06-03 NOTE — ED Triage Notes (Addendum)
Patient in with complaints right hand numbness, slurred speech and feeling lightheaded on yesterday around 1:15pm.  Patient reports that on yesterday her daughter reports that one of her eyes was droopy. Patient reports symptoms have resolved since yesterday and only lasted a couple of minutes. Patient alert and oriented x 3 and speech clear. Right eye noted to be droopy upon assessment. Hx of right femur rx  5 months ago

## 2020-06-03 NOTE — ED Provider Notes (Signed)
[facility]    CSN: [id number] Arrival date & time: 06/03/20  1303      History   Chief Complaint Chief Complaint  Patient presents with  . Numbness    right hand on yesterday  . Facial Droop    right eye    HPI [name] [name] is a 79 y.o. female.   Patient is an 79 year old female with past medical history of anemia, anxiety, arthritis, CAD, cancer, chronic pain, depression, GERD, edema, hypertension, osteoarthritis, osteopenia, peptic ulcer disease.  She presents today for episode around 115 yesterday when she was riding in the car.  Reporting started to hold her head due to mild headache and then had numbness in the right arm and slurred speech.  Reports this episode lasted a couple minutes and was witnessed by family.  Symptoms have resolved but she continues to have right eye droop.  Reports still feels a little lightheaded.  No blurred vision, current headache, slurred speech, dizziness.  Had femur surgery 5 months ago and walk with a walker.      Past Medical History:  Diagnosis Date  . Anemia    of chronic disease  . Anxiety   . Arthritis    "psoratic arthritis; new dx" (09/11/2016)  . CAD S/P percutaneous coronary angioplasty 08/2016   mLCx 90% -> 0% (2.75 mm x 16 mm) Synergy DES PCI Ostial D1 70% (not PTCA target). Moderate ostial and mD2.  LAD tandem 50% stenoses w/ negative FFR (0.86)  . Cancer ([place])    skin cancer on leg  . Chronic lower back pain   . Complication of anesthesia 1980 and 1982   trouble waking up after breast biopsy, many surgeries since no problem  . Depression   . Dyspnea on effort - CHRONIC    partial relief of Sx post PCI of L Circumflex.  . Fibrocystic breast   . GERD (gastroesophageal reflux disease)   . Headache    "monthly" (09/11/2016)  . History of blood transfusion    "several since age 63; I've had them after most of my surgeries" (09/11/2016)  . History of myocardial infarction    "doctor said there are signs I've  had a heart attack; don't know when"  - Myoview [name] 2018 indicated small Inferior - Inferoseptal Infarct (vs. rest ischemia). Echo with inferoseptal hypokinesis.  Marland Kitchen Hx of skin cancer, basal cell 2013 and 2012   right inner, lower leg; several scattered around my body  . Hypertension   . Iron deficiency anemia    "I've had an iron infusion"  . Osteoarthritis   . Osteopenia   . Peptic ulcer disease    H pylori  . PMR (polymyalgia rheumatica) (HCC)   . PONV (postoperative nausea and vomiting)   . Psoriasis     Patient Active Problem List   Diagnosis Date Noted  . Closed fracture of left distal femur ([place]) 02/04/2020  . Renal insufficiency 11/01/2019  . Pedal edema 10/04/2018  . Grief reaction 09/17/2018  . 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 Routine general medical examination at a health care facility 10/14/2017  . Preop cardiovascular exam 10/01/2017  . CAD S/P percutaneous coronary angioplasty 01/27/2017  . Aortic regurgitation 01/27/2017  . Dyspnea on exertion 08/19/2016  . Left bundle branch block (LBBB) on electrocardiogram 08/19/2016  . Estrogen deficiency 10/08/2015  . Chronic fatigue 08/20/2015  . Deficiency anemia 11/17/2013  . Encounter for Medicare annual wellness exam 07/17/2013  . PMR (polymyalgia rheumatica) (HCC) 07/17/2013  .  Intertrigo 04/19/2013  . Expected blood loss anemia 04/05/2013  . Obesity (BMI 30-39.9) 04/05/2013  . 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 IBS (irritable bowel syndrome) 05/30/2012  . Anemia of chronic disease 04/16/2010  . Generalized anxiety disorder 04/16/2010  . GERD 03/04/2009  . Vitamin D deficiency 03/12/2008  . URINARY TRACT INFECTION, RECURRENT 08/07/2007  . 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 FIBROCYSTIC BREAST DISEASE, HX OF 02/11/2007  . Hyperlipidemia with low HDL 01/10/2007  . DEPRESSION 01/10/2007  . RETINAL VEIN OCCLUSION 01/10/2007  . Essential hypertension 01/10/2007  . PEPTIC ULCER DISEASE 01/10/2007  .  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 SKIN CANCER, HX OF 01/10/2007    Past Surgical History:  Procedure Laterality Date  . APPENDECTOMY    . BACK SURGERY     x 6   . BREAST BIOPSY Left 1980 and 1982   "benign"  . CARDIAC CATHETERIZATION N/A 09/11/2016   Procedure: Left Heart Cath and Coronary Angiography;  Surgeon: Leonie Man, MD;  Location: [facility];  Service: Cardiovascular: mLCx 90% * (PCI). Ostial D1 70% (not PTCA target). Mod ostial and mD2.  LAD tandem 50% stenoses w/ negative FFR (0.86)  . CARDIAC CATHETERIZATION N/A 09/11/2016   Procedure: Intravascular Pressure Wire/FFR Study;  Surgeon: Leonie Man, MD;  Location: [facility];  Service: Cardiovascular: --  LAD tandem 50% stenoses w/ negative FFR (0.86  . CARDIAC CATHETERIZATION N/A 09/11/2016   Procedure: Coronary Stent Intervention;  Surgeon: Leonie Man, MD;  Location: [facility];  Service: Cardiovascular: -- DES PCI mLCx 90%--0% SYNERGY DES 2.75 MM X 16 MM.  Marland Kitchen CARDIAC CATHETERIZATION  2001   non-obstructive CAD  . CATARACT EXTRACTION W/ INTRAOCULAR LENS  IMPLANT, BILATERAL  July -  August 2017  . DILATION AND CURETTAGE OF UTERUS  X 3  . ESOPHAGOGASTRODUODENOSCOPY  03/2009   ulcer,HP, GERD stricture  . EXCISIONAL HEMORRHOIDECTOMY    . HIP ARTHROPLASTY Left 2010  . JOINT REPLACEMENT    . KNEE ARTHROSCOPY Bilateral   . LAPAROSCOPIC CHOLECYSTECTOMY  2005  . LUMBAR DISC SURGERY  11/1984; 1987; 1989; 2004; 2005 X 2   deg. disk lumber spine   . MOHS SURGERY Right    "inside my lower leg"  . NASAL SINUS SURGERY  1970s  . NM MYOVIEW LTD  08/28/2016   INTERMEDIATE RISK (b/c reduced EF & ? small infarct) -- EF ~35-40%.  ? Prior Small Inferior-apical & septal infarct (w/o ischemia).  Difuse HK - worse in inferoapex.  Suggest prior infarct with no ischemia. Ischemic area correlates with echo WMA  . ORIF FEMUR FRACTURE Right 01/04/2020   Procedure: OPEN REDUCTION INTERNAL FIXATION (ORIF) DISTAL FEMUR  FRACTURE;  Surgeon: Rod Can, MD;  Location: [facility];  Service: Orthopedics;  Laterality: Right;  . REVERSE SHOULDER ARTHROPLASTY Left 12/17/2017   Procedure: LEFT REVERSE SHOULDER ARTHROPLASTY;  Surgeon: Netta Cedars, MD;  Location: [facility];  Service: Orthopedics;  Laterality: Left;  . TOTAL HIP ARTHROPLASTY Right 04/04/2013   Procedure: RIGHT TOTAL HIP ARTHROPLASTY ANTERIOR APPROACH;  Surgeon: Mauri Pole, MD;  Location: [facility];  Service: Orthopedics;  Laterality: Right;  . TOTAL KNEE ARTHROPLASTY Right 2009  . TRANSTHORACIC ECHOCARDIOGRAM  08/2016   mild conc LVH. EF 45-50% - anteroseptal & inferoseptal HK. Mod AI.  Marland Kitchen TRANSTHORACIC ECHOCARDIOGRAM  08/2017   (post PCI) --> EF normalized:  Normal LV systolic function; mild diastolic dysfunction; mild   LVH; sclerotic aortic valve with mild AI.  No RWMA  . TUBAL LIGATION    . VAGINAL HYSTERECTOMY  1970s   partial     OB History   No obstetric history on file.      Home Medications    Prior to Admission medications   Medication Sig Start Date End Date Taking? Authorizing Provider  acetaminophen (TYLENOL) 500 MG tablet Take 1,000 mg by mouth 2 (two) times daily as needed for moderate pain or headache.     [provider]  atorvastatin (LIPITOR) 20 MG tablet TAKE 1 TABLET BY MOUTH EVERY DAY Patient taking differently: Take 20 mg by mouth daily.  10/11/19   Leonie Man, MD  busPIRone (BUSPAR) 15 MG tablet Take 1 tablet (15 mg total) by mouth daily. 04/23/20   Tower, [name], MD  cetirizine (ZYRTEC) 10 MG tablet Take 1 tablet (10 mg total) by mouth daily. 06/16/19   Elby Beck, FNP  clopidogrel (PLAVIX) 75 MG tablet TAKE 1 TABLET BY MOUTH EVERY DAY WITH BREAKFAST Patient taking differently: Take 75 mg by mouth daily.  11/27/19   Leonie Man, MD  HYDROcodone-acetaminophen (NORCO/VICODIN) 5-325 MG tablet Take 1 tablet by mouth 3 (three) times daily as needed for moderate pain. 03/08/20   Nicholas Lose, MD   losartan (COZAAR) 25 MG tablet Take 1 tablet (25 mg total) by mouth daily. 05/06/20   Tower, [name], MD  Melatonin 3 MG CAPS Take 18 mg by mouth at bedtime.     [provider]  methocarbamol (ROBAXIN) 500 MG tablet TAKE 1 TABLET BY MOUTH EVERY 8 HOURS AS NEEDED FOR MUSCLE SPASMS. 05/28/20   Tower, [name], MD  metoprolol tartrate (LOPRESSOR) 25 MG tablet TAKE 1 TABLET BY MOUTH TWICE A DAY Patient taking differently: Take 25 mg by mouth 2 (two) times daily.  11/22/19   Leonie Man, MD  pantoprazole (PROTONIX) 40 MG tablet Take 1 tablet (40 mg total) by mouth daily. 05/23/19   Leonie Man, MD  sertraline (ZOLOFT) 25 MG tablet Take 1 tablet (25 mg total) by mouth daily. 02/02/20   Tower, [name], MD  Vitamin D, Cholecalciferol, 25 MCG (1000 UT) CAPS Take 2,000 Units by mouth in the morning and at bedtime.     [provider]    Family History Family History  Problem Relation Age of Onset  . Arthritis Mother   . Emphysema Mother   . Hyperlipidemia Mother   . Hypertension Mother   . Heart disease Mother   . Heart disease Brother        CAD  . Diabetes Paternal Grandmother   . Heart disease Maternal Aunt     Social History Social History   Tobacco Use  . Smoking status: Never Smoker  . Smokeless tobacco: Never Used  Vaping Use  . Vaping Use: Never used  Substance Use Topics  . Alcohol use: Yes    Alcohol/week: 0.0 standard drinks    Comment: 09/11/2016 "might have a margarita q 3-4 months"  . Drug use: No     Allergies   Sulfonamide derivatives, Ace inhibitors, Hydrochlorothiazide, Oxaprozin, Pregabalin, and Amoxicillin-pot clavulanate   Review of Systems Review of Systems   Physical Exam Triage Vital Signs ED Triage Vitals  Enc Vitals Group     BP 06/03/20 1310 (!) 121/94     Pulse Rate 06/03/20 1310 62     Resp 06/03/20 1310 16     Temp 06/03/20 1310 98.2 F (36.8 C)  Temp Source 06/03/20 1310 Oral     SpO2 06/03/20 1310 97 %      Weight --      Height --      Head Circumference --      Peak Flow --      Pain Score 06/03/20 1313 0     Pain Loc --      Pain Edu? --      Excl. in [place]? --    No data found.  Updated Vital Signs BP (!) 121/94 (BP Location: Right Arm)   Pulse 62   Temp 98.2 F (36.8 C) (Oral)   Resp 16   SpO2 97%   Visual Acuity Right Eye Distance:   Left Eye Distance:   Bilateral Distance:    Right Eye Near:   Left Eye Near:    Bilateral Near:     Physical Exam Vitals and nursing note reviewed.  Constitutional:      General: She is not in acute distress.    Appearance: Normal appearance. She is not ill-appearing, toxic-appearing or diaphoretic.  HENT:     Head: Normocephalic.     Nose: Nose normal.  Eyes:     Conjunctiva/sclera: Conjunctivae normal.     Pupils: Pupils are equal, round, and reactive to light.  Pulmonary:     Effort: Pulmonary effort is normal.  Musculoskeletal:        General: Normal range of motion.     Cervical back: Normal range of motion.  Skin:    General: Skin is warm and dry.     Findings: No rash.  Neurological:     Mental Status: She is alert and oriented to person, place, and time.     Comments: Mild right eye droop Right grip slightly weaker than right.  No slurred speech mouth droop.  Normal sensation   Psychiatric:        Mood and Affect: Mood normal.        Behavior: Behavior normal.        Thought Content: Thought content normal.        Judgment: Judgment normal.      UC Treatments / Results  Labs (all labs ordered are listed, but only abnormal results are displayed) Labs Reviewed - No data to display  EKG   Radiology No results found.  Procedures Procedures (including critical care time)  Medications Ordered in UC Medications - No data to display  Initial Impression / Assessment and Plan / UC Course  I have reviewed the triage vital signs and the nursing notes.  Pertinent labs & imaging results that were available  during my care of the patient were reviewed by me and considered in my medical decision making (see chart for details).     Facial droop Concern patient may have had a CVA yesterday. Recommended go to the ER for further evaluation. Patient understanding and agreed and is with family will take her Final Clinical Impressions(s) / UC Diagnoses   Final diagnoses:  Facial droop     Discharge Instructions     Please go to the ER     ED Prescriptions    None     PDMP not reviewed this encounter.   Orvan July, NP 06/03/20 1448

## 2020-06-03 NOTE — Telephone Encounter (Signed)
Pt's daughter, Bethena Roys (on dpr), lvm.  She states pt will be traveling this weekend.  Asking if pt should get COVID booster shot prior to leaving.  Bethena Roys can be reached at [phone].  Plz advise.

## 2020-06-04 ENCOUNTER — Observation Stay (HOSPITAL_BASED_OUTPATIENT_CLINIC_OR_DEPARTMENT_OTHER): Payer: Medicare Other

## 2020-06-04 ENCOUNTER — Emergency Department (HOSPITAL_COMMUNITY): Payer: Medicare Other

## 2020-06-04 DIAGNOSIS — I2510 Atherosclerotic heart disease of native coronary artery without angina pectoris: Secondary | ICD-10-CM | POA: Diagnosis not present

## 2020-06-04 DIAGNOSIS — E786 Lipoprotein deficiency: Secondary | ICD-10-CM

## 2020-06-04 DIAGNOSIS — F411 Generalized anxiety disorder: Secondary | ICD-10-CM

## 2020-06-04 DIAGNOSIS — G459 Transient cerebral ischemic attack, unspecified: Secondary | ICD-10-CM | POA: Diagnosis not present

## 2020-06-04 DIAGNOSIS — R0600 Dyspnea, unspecified: Secondary | ICD-10-CM | POA: Diagnosis not present

## 2020-06-04 DIAGNOSIS — E785 Hyperlipidemia, unspecified: Secondary | ICD-10-CM

## 2020-06-04 DIAGNOSIS — D649 Anemia, unspecified: Secondary | ICD-10-CM | POA: Insufficient documentation

## 2020-06-04 DIAGNOSIS — I10 Essential (primary) hypertension: Secondary | ICD-10-CM

## 2020-06-04 DIAGNOSIS — Z8673 Personal history of transient ischemic attack (TIA), and cerebral infarction without residual deficits: Secondary | ICD-10-CM | POA: Diagnosis present

## 2020-06-04 DIAGNOSIS — Z9861 Coronary angioplasty status: Secondary | ICD-10-CM

## 2020-06-04 HISTORY — PX: TRANSTHORACIC ECHOCARDIOGRAM: SHX275

## 2020-06-04 LAB — RESPIRATORY PANEL BY RT PCR (FLU A&B, COVID)
Influenza A by PCR: NEGATIVE
Influenza B by PCR: NEGATIVE
SARS Coronavirus 2 by RT PCR: NEGATIVE

## 2020-06-04 LAB — ECHOCARDIOGRAM COMPLETE
Area-P 1/2: 4.6 cm2
S' Lateral: 2.6 cm

## 2020-06-04 MED ORDER — STROKE: EARLY STAGES OF RECOVERY BOOK: Freq: Once | Status: DC

## 2020-06-04 MED ORDER — METOPROLOL TARTRATE 25 MG PO TABS
25.0000 mg | ORAL_TABLET | Freq: Two times a day (BID) | ORAL | Status: DC
  Administered 2020-06-04: 25 mg via ORAL
  Filled 2020-06-04: qty 1

## 2020-06-04 MED ORDER — MORPHINE SULFATE (PF) 4 MG/ML IV SOLN: 4.0000 mg | Freq: Once | INTRAVENOUS | Status: DC

## 2020-06-04 MED ORDER — CLOPIDOGREL BISULFATE 75 MG PO TABS
75.0000 mg | ORAL_TABLET | Freq: Every day | ORAL | Status: DC
  Administered 2020-06-04: 75 mg via ORAL
  Filled 2020-06-04: qty 1

## 2020-06-04 MED ORDER — ASPIRIN EC 81 MG PO TBEC: 81.0000 mg | DELAYED_RELEASE_TABLET | Freq: Every day | ORAL | Status: DC

## 2020-06-04 MED ORDER — SODIUM CHLORIDE 0.9 % IV SOLN: INTRAVENOUS | Status: DC

## 2020-06-04 MED ORDER — BUSPIRONE HCL 15 MG PO TABS
15.0000 mg | ORAL_TABLET | Freq: Every day | ORAL | Status: DC
  Administered 2020-06-04: 15 mg via ORAL
  Filled 2020-06-04: qty 2

## 2020-06-04 MED ORDER — ACETAMINOPHEN 500 MG PO TABS: 1000.0000 mg | ORAL_TABLET | Freq: Two times a day (BID) | ORAL | Status: DC | PRN

## 2020-06-04 MED ORDER — ASPIRIN 81 MG PO TBEC: 81.0000 mg | DELAYED_RELEASE_TABLET | Freq: Every day | ORAL | 0 refills | Status: AC

## 2020-06-04 MED ORDER — LOSARTAN POTASSIUM 25 MG PO TABS
25.0000 mg | ORAL_TABLET | Freq: Every day | ORAL | Status: DC
  Administered 2020-06-04: 25 mg via ORAL
  Filled 2020-06-04: qty 1

## 2020-06-04 MED ORDER — PANTOPRAZOLE SODIUM 40 MG PO TBEC
40.0000 mg | DELAYED_RELEASE_TABLET | Freq: Every day | ORAL | Status: DC
  Administered 2020-06-04: 40 mg via ORAL
  Filled 2020-06-04: qty 1

## 2020-06-04 MED ORDER — SERTRALINE HCL 50 MG PO TABS
25.0000 mg | ORAL_TABLET | Freq: Every day | ORAL | Status: DC
  Administered 2020-06-04: 25 mg via ORAL
  Filled 2020-06-04 (×2): qty 1

## 2020-06-04 MED ORDER — HYDROCODONE-ACETAMINOPHEN 5-325 MG PO TABS
1.0000 | ORAL_TABLET | Freq: Three times a day (TID) | ORAL | Status: DC | PRN
  Administered 2020-06-04: 1 via ORAL
  Filled 2020-06-04: qty 1

## 2020-06-04 MED ORDER — ATORVASTATIN CALCIUM 10 MG PO TABS
20.0000 mg | ORAL_TABLET | Freq: Every day | ORAL | Status: DC
  Administered 2020-06-04: 20 mg via ORAL
  Filled 2020-06-04: qty 2

## 2020-06-04 NOTE — Discharge Summary (Signed)
ARIHANNA [name], is a 79 y.o. female  DOB [birth year]  MRN [id number].  Admission date:  06/03/2020  Admitting Physician  Amy August Luz, DO  Discharge Date:  06/04/2020   Primary MD  Tower, [name], MD  Recommendations for primary care physician for things to follow:  -Please check A1c, and lipid panel as an outpatient to complete TIA work-up  Admission Diagnosis  TIA (transient ischemic attack) [G45.9] Renal insufficiency [N28.9] Normochromic normocytic anemia [D64.9]   Discharge Diagnosis  TIA (transient ischemic attack) [G45.9] Renal insufficiency [N28.9] Normochromic normocytic anemia [D64.9]    Principal Problem:   TIA (transient ischemic attack) Active Problems:   Hyperlipidemia with low HDL   Generalized anxiety disorder   Essential hypertension   Dyspnea on exertion   CAD S/P percutaneous coronary angioplasty      Past Medical History:  Diagnosis Date  . Anemia    of chronic disease  . Anxiety   . Arthritis    "psoratic arthritis; new dx" (09/11/2016)  . CAD S/P percutaneous coronary angioplasty 08/2016   mLCx 90% -> 0% (2.75 mm x 16 mm) Synergy DES PCI Ostial D1 70% (not PTCA target). Moderate ostial and mD2.  LAD tandem 50% stenoses w/ negative FFR (0.86)  . Cancer ([place])    skin cancer on leg  . Chronic lower back pain   . Complication of anesthesia 1980 and 1982   trouble waking up after breast biopsy, many surgeries since no problem  . Depression   . Dyspnea on effort - CHRONIC    partial relief of Sx post PCI of L Circumflex.  . Fibrocystic breast   . GERD (gastroesophageal reflux disease)   . Headache    "monthly" (09/11/2016)  . History of blood transfusion    "several since age 47; I've had them after most of my surgeries" (09/11/2016)  . History of myocardial infarction    "doctor said there are signs I've had a heart attack; don't know when"  - Myoview [name] 2018  indicated small Inferior - Inferoseptal Infarct (vs. rest ischemia). Echo with inferoseptal hypokinesis.  Marland Kitchen Hx of skin cancer, basal cell 2013 and 2012   right inner, lower leg; several scattered around my body  . Hypertension   . Iron deficiency anemia    "I've had an iron infusion"  . Osteoarthritis   . Osteopenia   . Peptic ulcer disease    H pylori  . PMR (polymyalgia rheumatica) ([facility])   . PONV (postoperative nausea and vomiting)   . Psoriasis     Past Surgical History:  Procedure Laterality Date  . APPENDECTOMY    . BACK SURGERY     x 6   . BREAST BIOPSY Left 1980 and 1982   "benign"  . CARDIAC CATHETERIZATION N/A 09/11/2016   Procedure: Left Heart Cath and Coronary Angiography;  Surgeon: Leonie Man, MD;  Location: [facility];  Service: Cardiovascular: mLCx 90% * (PCI). Ostial D1 70% (not PTCA target). Mod ostial and mD2.  LAD  tandem 50% stenoses w/ negative FFR (0.86)  . CARDIAC CATHETERIZATION N/A 09/11/2016   Procedure: Intravascular Pressure Wire/FFR Study;  Surgeon: Leonie Man, MD;  Location: [facility];  Service: Cardiovascular: --  LAD tandem 50% stenoses w/ negative FFR (0.86  . CARDIAC CATHETERIZATION N/A 09/11/2016   Procedure: Coronary Stent Intervention;  Surgeon: Leonie Man, MD;  Location: [facility];  Service: Cardiovascular: -- DES PCI mLCx 90%--0% SYNERGY DES 2.75 MM X 16 MM.  Marland Kitchen CARDIAC CATHETERIZATION  2001   non-obstructive CAD  . CATARACT EXTRACTION W/ INTRAOCULAR LENS  IMPLANT, BILATERAL  July -  August 2017  . DILATION AND CURETTAGE OF UTERUS  X 3  . ESOPHAGOGASTRODUODENOSCOPY  03/2009   ulcer,HP, GERD stricture  . EXCISIONAL HEMORRHOIDECTOMY    . HIP ARTHROPLASTY Left 2010  . JOINT REPLACEMENT    . KNEE ARTHROSCOPY Bilateral   . LAPAROSCOPIC CHOLECYSTECTOMY  2005  . LUMBAR DISC SURGERY  11/1984; 1987; 1989; 2004; 2005 X 2   deg. disk lumber spine   . MOHS SURGERY Right    "inside my lower leg"  . NASAL SINUS  SURGERY  1970s  . NM MYOVIEW LTD  08/28/2016   INTERMEDIATE RISK (b/c reduced EF & ? small infarct) -- EF ~35-40%.  ? Prior Small Inferior-apical & septal infarct (w/o ischemia).  Difuse HK - worse in inferoapex.  Suggest prior infarct with no ischemia. Ischemic area correlates with echo WMA  . ORIF FEMUR FRACTURE Right 01/04/2020   Procedure: OPEN REDUCTION INTERNAL FIXATION (ORIF) DISTAL FEMUR FRACTURE;  Surgeon: Rod Can, MD;  Location: [facility];  Service: Orthopedics;  Laterality: Right;  . REVERSE SHOULDER ARTHROPLASTY Left 12/17/2017   Procedure: LEFT REVERSE SHOULDER ARTHROPLASTY;  Surgeon: Netta Cedars, MD;  Location: [facility];  Service: Orthopedics;  Laterality: Left;  . TOTAL HIP ARTHROPLASTY Right 04/04/2013   Procedure: RIGHT TOTAL HIP ARTHROPLASTY ANTERIOR APPROACH;  Surgeon: Mauri Pole, MD;  Location: [facility];  Service: Orthopedics;  Laterality: Right;  . TOTAL KNEE ARTHROPLASTY Right 2009  . TRANSTHORACIC ECHOCARDIOGRAM  08/2016   mild conc LVH. EF 45-50% - anteroseptal & inferoseptal HK. Mod AI.  Marland Kitchen TRANSTHORACIC ECHOCARDIOGRAM  08/2017   (post PCI) --> EF normalized:  Normal LV systolic function; mild diastolic dysfunction; mild   LVH; sclerotic aortic valve with mild AI.  No RWMA  . TUBAL LIGATION    . VAGINAL HYSTERECTOMY  1970s   partial        History of present illness and  Hospital Course:     Kindly see H&P for history of present illness and admission details, please review complete Labs, Consult reports and Test reports for all details in brief  HPI  from the history and physical done on the day of admission 06/03/2020   HPI: [name] [name] is a 79 y.o. female with medical history significant for CAD s/p DES to mid circumflex in (09/11/2016), hypertension, baseline dyspnea with exertion, generalized anxiety disorder, hld, presents with chief concern of right hand numbness and difficulty speaking that started on Sunday, 06/02/20 in the AM.   She did not want  to get evaluated for this. On Monday, after calling her pcp for an unrelated matter, daughter informed pcp office of her symptoms and was advised to present to ED for evaluation as soon as possible.   She reports never experiencing these symptoms before. ROS was negative for F/C/N/V/D, headache, vision changes, changes to baseline shortness of breath, chest pain, abdominal  pain, changes to urinary/bowel habits.   She has received her covid vaccine.   ED Course: Discussed with Ed provider. ED provider spoke with neurology for TIA. CT head negative for acute changes. MRI ordered  Hospital Course  TIA - - CT head was negative for hemorrhage MRI brain and MRA head with no acute intracranial abnormalities, no major intracranial arterial occlusion or significant proximal stenosis, Dopplers with no significant stenosis as well, 2D echo with no embolic source, EKG with normal sinus rhythm, neurology input greatly appreciated, recommendation for dual antiplatelet therapy aspirin and Plavix, then Plavix alone, she is already on statin he was seen by PT SLP, no home health needed.  # h/o CAD s/p DES to mid circumflex in 09/11/2016 # Hypertension  - Statin therapy - currently on moderate dosing, follow with cardiology  - Losartan 25 mg daily, metoprolol tartrate 25 mg BID  -Continue with Plavix after finishing dual antiplatelet therapy for 3 weeks  # HLD - atorvastatin 20 mg qhs to have lipid panel as an outpatient # GERD - pantoprazole 40 mg daily  # Anxiety/depression -continue with home medications # Anemia of chronic disease - follows with oncology      Discharge Condition:  Stable   Follow UP   Follow-up Information    Leonie Man, MD .   Specialty: Cardiology Contact information: [address] [place] [address] [phone]                 Discharge Instructions  and  Discharge Medications     Discharge Instructions    Diet - low sodium heart  healthy   Complete by: As directed    Discharge instructions   Complete by: As directed    Follow with Primary MD Tower, [name], MD in 7 days   Get CBC, CMP,  by Primary MD next visit.    Activity: As tolerated with Full fall precautions use walker/cane & assistance as needed   Disposition Home    Diet: Heart Healthy  , with feeding assistance and aspiration precautions.  For Heart failure patients - Check your Weight same time everyday, if you gain over 2 pounds, or you develop in leg swelling, experience more shortness of breath or chest pain, call your Primary MD immediately. Follow Cardiac Low Salt Diet and 1.5 lit/day fluid restriction.   On your next visit with your primary care physician please Get Medicines reviewed and adjusted.   Please request your Prim.MD to go over all Hospital Tests and Procedure/Radiological results at the follow up, please get all Hospital records sent to your Prim MD by signing hospital release before you go home.   If you experience worsening of your admission symptoms, develop shortness of breath, life threatening emergency, suicidal or homicidal thoughts you must seek medical attention immediately by calling 911 or calling your MD immediately  if symptoms less severe.  You Must read complete instructions/literature along with all the possible adverse reactions/side effects for all the Medicines you take and that have been prescribed to you. Take any new Medicines after you have completely understood and accpet all the possible adverse reactions/side effects.   Do not drive, operating heavy machinery, perform activities at heights, swimming or participation in water activities or provide baby sitting services if your were admitted for syncope or siezures until you have seen by Primary MD or a Neurologist and advised to do so again.  Do not drive when taking Pain medications.  Do not take more than prescribed Pain, Sleep and Anxiety  Medications  Special Instructions: If you have smoked or chewed Tobacco  in the last 2 yrs please stop smoking, stop any regular Alcohol  and or any Recreational drug use.  Wear Seat belts while driving.   Please note  You were cared for by a hospitalist during your hospital stay. If you have any questions about your discharge medications or the care you received while you were in the hospital after you are discharged, you can call the unit and asked to speak with the hospitalist on call if the hospitalist that took care of you is not available. Once you are discharged, your primary care physician will handle any further medical issues. Please note that NO REFILLS for any discharge medications will be authorized once you are discharged, as it is imperative that you return to your primary care physician (or establish a relationship with a primary care physician if you do not have one) for your aftercare needs so that they can reassess your need for medications and monitor your lab values.   Increase activity slowly   Complete by: As directed      Allergies as of 06/04/2020      Reactions   Sulfonamide Derivatives Other (See Comments)   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   Ace Inhibitors    cough   Hydrochlorothiazide    Hyponatremia   Oxaprozin    GI upset    Pregabalin Swelling   SWELLING REACTION UNSPECIFIED    Amoxicillin-pot Clavulanate Nausea And Vomiting   gi upset Has patient had a PCN reaction causing immediate rash, facial/tongue/throat swelling, SOB or lightheadedness with hypotension: No Has patient had a PCN reaction causing severe rash involving mucus membranes or skin necrosis: No Has patient had a PCN reaction that required hospitalization No Has patient had a PCN reaction occurring within the last 10 years: No If all of the above answers are "NO", then may proceed with Cephalosporin use.      Medication List    TAKE these medications   acetaminophen 500 MG  tablet Commonly known as: TYLENOL Take 1,000 mg by mouth 2 (two) times daily as needed for moderate pain or headache.   aspirin 81 MG EC tablet Take 1 tablet (81 mg total) by mouth daily for 21 days. Swallow whole.   atorvastatin 20 MG tablet Commonly known as: LIPITOR TAKE 1 TABLET BY MOUTH EVERY DAY   busPIRone 15 MG tablet Commonly known as: BUSPAR Take 1 tablet (15 mg total) by mouth daily.   cetirizine 10 MG tablet Commonly known as: ZYRTEC Take 1 tablet (10 mg total) by mouth daily.   clopidogrel 75 MG tablet Commonly known as: PLAVIX TAKE 1 TABLET BY MOUTH EVERY DAY WITH BREAKFAST What changed: See the new instructions.   HYDROcodone-acetaminophen 5-325 MG tablet Commonly known as: NORCO/VICODIN Take 1 tablet by mouth 3 (three) times daily as needed for moderate pain.   losartan 25 MG tablet Commonly known as: Cozaar Take 1 tablet (25 mg total) by mouth daily.   Melatonin 3 MG Caps Take 6 mg by mouth at bedtime.   methocarbamol 500 MG tablet Commonly known as: ROBAXIN TAKE 1 TABLET BY MOUTH EVERY 8 HOURS AS NEEDED FOR MUSCLE SPASMS. What changed: See the new instructions.   metoprolol tartrate 25 MG tablet Commonly known as: LOPRESSOR TAKE 1 TABLET BY MOUTH TWICE A DAY   pantoprazole 40 MG tablet Commonly known as: PROTONIX Take 1 tablet (  40 mg total) by mouth daily.   sertraline 25 MG tablet Commonly known as: Zoloft Take 1 tablet (25 mg total) by mouth daily.   Vitamin D (Cholecalciferol) 25 MCG (1000 UT) Caps Take 2,000 Units by mouth in the morning and at bedtime.         Diet and Activity recommendation: See Discharge Instructions above   Consults obtained - neurolgoy   Major procedures and Radiology Reports - PLEASE review detailed and final reports for all details, in brief -      CT HEAD WO CONTRAST  Result Date: 06/03/2020 CLINICAL DATA:  Neuro deficit, acute, stroke suspected. EXAM: CT HEAD WITHOUT CONTRAST TECHNIQUE:  Contiguous axial images were obtained from the base of the skull through the vertex without intravenous contrast. COMPARISON:  Head CT 05/11/2006 FINDINGS: Brain: Mild generalized cerebral atrophy. Minimal ill-defined hypoattenuation within the cerebral white matter which is nonspecific, but consistent with chronic small vessel ischemic disease. These findings have subtly progressed as compared to the head CT of 05/11/2006. There is no acute intracranial hemorrhage. No demarcated cortical infarct. No extra-axial fluid collection. No evidence of intracranial mass. No midline shift. Vascular: No hyperdense vessel.  Atherosclerotic calcifications. Skull: Normal. Negative for fracture or focal lesion. Sinuses/Orbits: Visualized orbits show no acute finding. Minimal ethmoid sinus mucosal thickening. No significant mastoid effusion. IMPRESSION: No evidence of acute intracranial abnormality. Mild generalized cerebral atrophy and chronic small vessel ischemic disease, subtly progressed as compared to the head CT of 05/11/2006. Minimal ethmoid sinus mucosal thickening. Electronically Signed   By: Kellie Simmering DO   On: 06/03/2020 17:21   MR ANGIO HEAD WO CONTRAST  Result Date: 06/04/2020 CLINICAL DATA:  TIA. Transient right hand numbness, slurred speech, and lightheadedness. Right eye droop. EXAM: MRI HEAD WITHOUT CONTRAST MRA HEAD WITHOUT CONTRAST TECHNIQUE: Multiplanar, multiecho pulse sequences of the brain and surrounding structures were obtained without intravenous contrast. Angiographic images of the head were obtained using MRA technique without contrast. COMPARISON:  Head CT 06/03/2020 FINDINGS: MRI HEAD FINDINGS Brain: There is no evidence of an acute infarct, intracranial hemorrhage, mass, midline shift, or extra-axial fluid collection. The ventricles and sulci are within normal limits for age. Small T2 hyperintensities in the cerebral white matter bilaterally are nonspecific but compatible with mild chronic  small vessel ischemic disease. Vascular: Major intracranial vascular flow voids are preserved. Skull and upper cervical spine: Unremarkable bone marrow signal. Sinuses/Orbits: Bilateral cataract extraction. Paranasal sinuses and mastoid air cells are clear. Other: None. MRA HEAD FINDINGS The visualized distal vertebral arteries are widely patent to the basilar. The right PICA and left AICA appear dominant. Patent SCAs are seen bilaterally. The basilar artery is widely patent. Posterior communicating arteries are not clearly identified and may be diminutive or absent. Both PCAs are patent without evidence of a significant proximal stenosis. The internal carotid arteries are patent from skull base to carotid termini with atherosclerotic irregularity of the cavernous segments but no evidence of a significant stenosis. ACAs and MCAs are patent without evidence of a proximal branch occlusion or significant proximal stenosis. No aneurysm is identified. IMPRESSION: 1. No acute intracranial abnormality. 2. Mild chronic small vessel ischemic disease. 3. No major intracranial arterial occlusion or significant proximal stenosis. Electronically Signed   By: Logan Bores M.D.   On: 06/04/2020 06:13   MR BRAIN WO CONTRAST  Result Date: 06/04/2020 CLINICAL DATA:  TIA. Transient right hand numbness, slurred speech, and lightheadedness. Right eye droop. EXAM: MRI HEAD WITHOUT CONTRAST MRA HEAD WITHOUT  CONTRAST TECHNIQUE: Multiplanar, multiecho pulse sequences of the brain and surrounding structures were obtained without intravenous contrast. Angiographic images of the head were obtained using MRA technique without contrast. COMPARISON:  Head CT 06/03/2020 FINDINGS: MRI HEAD FINDINGS Brain: There is no evidence of an acute infarct, intracranial hemorrhage, mass, midline shift, or extra-axial fluid collection. The ventricles and sulci are within normal limits for age. Small T2 hyperintensities in the cerebral white matter  bilaterally are nonspecific but compatible with mild chronic small vessel ischemic disease. Vascular: Major intracranial vascular flow voids are preserved. Skull and upper cervical spine: Unremarkable bone marrow signal. Sinuses/Orbits: Bilateral cataract extraction. Paranasal sinuses and mastoid air cells are clear. Other: None. MRA HEAD FINDINGS The visualized distal vertebral arteries are widely patent to the basilar. The right PICA and left AICA appear dominant. Patent SCAs are seen bilaterally. The basilar artery is widely patent. Posterior communicating arteries are not clearly identified and may be diminutive or absent. Both PCAs are patent without evidence of a significant proximal stenosis. The internal carotid arteries are patent from skull base to carotid termini with atherosclerotic irregularity of the cavernous segments but no evidence of a significant stenosis. ACAs and MCAs are patent without evidence of a proximal branch occlusion or significant proximal stenosis. No aneurysm is identified. IMPRESSION: 1. No acute intracranial abnormality. 2. Mild chronic small vessel ischemic disease. 3. No major intracranial arterial occlusion or significant proximal stenosis. Electronically Signed   By: Logan Bores M.D.   On: 06/04/2020 06:13   ECHOCARDIOGRAM COMPLETE  Result Date: 06/04/2020    ECHOCARDIOGRAM REPORT   Patient Name:   [name] [name] Date of Exam: 06/04/2020 Medical Rec #:  [id number]       Height:       63.0 in Accession #:    [id number]      Weight:       175.3 lb Date of Birth:  April 29, 1941        BSA:          1.828 m Patient Age:    29 years        BP:           165/87 mmHg Patient Gender: F               HR:           64 bpm. Exam Location:  Inpatient Procedure: 2D Echo, Cardiac Doppler and Color Doppler Indications:    TIA  History:        Patient has prior history of Echocardiogram examinations, most                 recent 09/07/2017. CAD and Previous Myocardial Infarction,                  Signs/Symptoms:Dyspnea; Risk Factors:Hypertension.  Sonographer:    Dustin Flock Referring Phys: [phone] AMY N [facility] IMPRESSIONS  1. Left ventricular ejection fraction, by estimation, is 60 to 65%. The left ventricle has normal function. The left ventricle has no regional wall motion abnormalities. There is mild concentric left ventricular hypertrophy. Left ventricular diastolic parameters are consistent with Grade I diastolic dysfunction (impaired relaxation). Elevated left atrial pressure.  2. Right ventricular systolic function is normal. The right ventricular size is normal. There is normal pulmonary artery systolic pressure. The estimated right ventricular systolic pressure is 96.2 mmHg.  3. The mitral valve is normal in structure. No evidence of mitral valve regurgitation. No evidence of mitral stenosis.  4. The  aortic valve is normal in structure. There is mild calcification of the aortic valve, primarily on the noncoronary cusp. Aortic valve regurgitation is trivial. Mild aortic valve sclerosis is present, with no evidence of aortic valve stenosis.  5. The inferior vena cava is normal in size with greater than 50% respiratory variability, suggesting right atrial pressure of 3 mmHg. FINDINGS  Left Ventricle: Left ventricular ejection fraction, by estimation, is 60 to 65%. The left ventricle has normal function. The left ventricle has no regional wall motion abnormalities. The left ventricular internal cavity size was normal in size. There is  mild concentric left ventricular hypertrophy. Left ventricular diastolic parameters are consistent with Grade I diastolic dysfunction (impaired relaxation). Elevated left atrial pressure. Right Ventricle: The right ventricular size is normal. No increase in right ventricular wall thickness. Right ventricular systolic function is normal. There is normal pulmonary artery systolic pressure. The tricuspid regurgitant velocity is 2.55 m/s, and  with an assumed right  atrial pressure of 3 mmHg, the estimated right ventricular systolic pressure is 97.6 mmHg. Left Atrium: Left atrial size was normal in size. Right Atrium: Right atrial size was normal in size. Pericardium: Trivial pericardial effusion is present. Mitral Valve: The mitral valve is normal in structure. No evidence of mitral valve regurgitation. No evidence of mitral valve stenosis. Tricuspid Valve: The tricuspid valve is normal in structure. Tricuspid valve regurgitation is trivial. No evidence of tricuspid stenosis. Aortic Valve: The aortic valve is normal in structure. There is mild calcification of the aortic valve. Aortic valve regurgitation is trivial. Mild aortic valve sclerosis is present, with no evidence of aortic valve stenosis. Pulmonic Valve: The pulmonic valve was normal in structure. Pulmonic valve regurgitation is not visualized. No evidence of pulmonic stenosis. Aorta: The aortic root is normal in size and structure. Venous: The inferior vena cava is normal in size with greater than 50% respiratory variability, suggesting right atrial pressure of 3 mmHg. IAS/Shunts: No atrial level shunt detected by color flow Doppler.  LEFT VENTRICLE PLAX 2D LVIDd:         3.50 cm  Diastology LVIDs:         2.60 cm  LV e' medial:    6.96 cm/s LV PW:         1.30 cm  LV E/e' medial:  11.7 LV IVS:        1.30 cm  LV e' lateral:   4.57 cm/s LVOT diam:     1.90 cm  LV E/e' lateral: 17.8 LV SV:         69 LV SV Index:   38 LVOT Area:     2.84 cm  RIGHT VENTRICLE RV Basal diam:  2.90 cm RV S prime:     13.90 cm/s TAPSE (M-mode): 3.0 cm LEFT ATRIUM             Index       RIGHT ATRIUM           Index LA diam:        3.00 cm 1.64 cm/m  RA Area:     13.70 cm LA Vol (A2C):   58.6 ml 32.05 ml/m RA Volume:   31.80 ml  17.39 ml/m LA Vol (A4C):   43.1 ml 23.57 ml/m LA Biplane Vol: 50.5 ml 27.62 ml/m  AORTIC VALVE LVOT Vmax:   117.00 cm/s LVOT Vmean:  87.100 cm/s LVOT VTI:    0.244 m  AORTA Ao Root diam: 3.00 cm MITRAL VALVE  TRICUSPID VALVE MV Area (PHT): 4.60 cm     TR Peak grad:   26.0 mmHg MV Decel Time: 165 msec     TR Vmax:        255.00 cm/s MV E velocity: 81.50 cm/s MV A velocity: 125.00 cm/s  SHUNTS MV E/A ratio:  0.65         Systemic VTI:  0.24 m                             Systemic Diam: 1.90 cm Dani Gobble Croitoru MD Electronically signed by Sanda Klein MD Signature Date/Time: 06/04/2020/1:14:57 PM    Final    VAS US CAROTID  Result Date: 06/04/2020 Carotid Arterial Duplex Study Indications:       TIA and Syncope. Risk Factors:      Hypertension. Comparison Study:  MRI done today Performing Technologist: Griffin Basil RCT RDMS  Examination Guidelines: A complete evaluation includes B-mode imaging, spectral Doppler, color Doppler, and power Doppler as needed of all accessible portions of each vessel. Bilateral testing is considered an integral part of a complete examination. Limited examinations for reoccurring indications may be performed as noted.  Right Carotid Findings: +----------+--------+--------+--------+------------------+------------------+           PSV cm/sEDV cm/sStenosisPlaque DescriptionComments           +----------+--------+--------+--------+------------------+------------------+ CCA Prox  103     12                                                   +----------+--------+--------+--------+------------------+------------------+ CCA Distal93      12                                intimal thickening +----------+--------+--------+--------+------------------+------------------+ ICA Prox  126     34      1-39%                     intimal thickening +----------+--------+--------+--------+------------------+------------------+ ICA Distal130     30                                                   +----------+--------+--------+--------+------------------+------------------+ ECA       97      1                                                     +----------+--------+--------+--------+------------------+------------------+ +----------+--------+-------+--------+-------------------+           PSV cm/sEDV cmsDescribeArm Pressure (mmHG) +----------+--------+-------+--------+-------------------+ Subclavian204     6                                  +----------+--------+-------+--------+-------------------+ +---------+--------+--+--------+--+---------+ VertebralPSV cm/s74EDV cm/s18Antegrade +---------+--------+--+--------+--+---------+  Left Carotid Findings: +----------+--------+--------+--------+------------------+------------------+           PSV cm/sEDV cm/sStenosisPlaque DescriptionComments           +----------+--------+--------+--------+------------------+------------------+ CCA Prox  83  12                                                   +----------+--------+--------+--------+------------------+------------------+ CCA Distal79      17                                intimal thickening +----------+--------+--------+--------+------------------+------------------+ ICA Prox  72      16      1-39%                     intimal thickening +----------+--------+--------+--------+------------------+------------------+ ICA Distal115     37                                                   +----------+--------+--------+--------+------------------+------------------+ ECA       85      2                                                    +----------+--------+--------+--------+------------------+------------------+ +----------+--------+--------+--------+-------------------+           PSV cm/sEDV cm/sDescribeArm Pressure (mmHG) +----------+--------+--------+--------+-------------------+ [id number]     1                                   +----------+--------+--------+--------+-------------------+ +---------+--------+--+--------+--+---------+ VertebralPSV cm/s84EDV cm/s22Antegrade  +---------+--------+--+--------+--+---------+   Summary: Right Carotid: Velocities in the right ICA are consistent with a 1-39% stenosis. Left Carotid: Velocities in the left ICA are consistent with a 1-39% stenosis. Vertebrals: Bilateral vertebral arteries demonstrate antegrade flow. *See table(s) above for measurements and observations.  Electronically signed by Deitra Mayo MD on 06/04/2020 at 2:16:36 PM.    Final     Micro Results     Recent Results (from the past 240 hour(s))  Respiratory Panel by RT PCR (Flu A&B, Covid) - Nasopharyngeal Swab     Status: None   Collection Time: 06/04/20  4:48 AM   Specimen: Nasopharyngeal Swab  Result Value Ref Range Status   SARS Coronavirus 2 by RT PCR NEGATIVE NEGATIVE Final    Comment: (NOTE) SARS-CoV-2 target nucleic acids are NOT DETECTED.  The SARS-CoV-2 RNA is generally detectable in upper respiratoy specimens during the acute phase of infection. The lowest concentration of SARS-CoV-2 viral copies this assay can detect is 131 copies/mL. A negative result does not preclude SARS-Cov-2 infection and should not be used as the sole basis for treatment or other patient management decisions. A negative result may occur with  improper specimen collection/handling, submission of specimen other than nasopharyngeal swab, presence of viral mutation(s) within the areas targeted by this assay, and inadequate number of viral copies (<131 copies/mL). A negative result must be combined with clinical observations, patient history, and epidemiological information. The expected result is Negative.  Fact Sheet for Patients:  PinkCheek.be  Fact Sheet for Healthcare Providers:  GravelBags.it  This test is no t yet approved or cleared by the [place] FDA and  has  been authorized for detection and/or diagnosis of SARS-CoV-2 by FDA under an Emergency Use Authorization (EUA). This EUA will  remain  in effect (meaning this test can be used) for the duration of the COVID-19 declaration under Section 564(b)(1) of the Act, 21 U.S.C. section 360bbb-3(b)(1), unless the authorization is terminated or revoked sooner.     Influenza A by PCR NEGATIVE NEGATIVE Final   Influenza B by PCR NEGATIVE NEGATIVE Final    Comment: (NOTE) The Xpert Xpress SARS-CoV-2/FLU/RSV assay is intended as an aid in  the diagnosis of influenza from Nasopharyngeal swab specimens and  should not be used as a sole basis for treatment. Nasal washings and  aspirates are unacceptable for Xpert Xpress SARS-CoV-2/FLU/RSV  testing.  Fact Sheet for Patients: PinkCheek.be  Fact Sheet for Healthcare Providers: GravelBags.it  This test is not yet approved or cleared by the [place] FDA and  has been authorized for detection and/or diagnosis of SARS-CoV-2 by  FDA under an Emergency Use Authorization (EUA). This EUA will remain  in effect (meaning this test can be used) for the duration of the  Covid-19 declaration under Section 564(b)(1) of the Act, 21  U.S.C. section 360bbb-3(b)(1), unless the authorization is  terminated or revoked. Performed at [facility], [address] [place], [place] [address]        Today   Subjective:   [name] [name] today has no headache,no chest abdominal pain,no new weakness tingling or numbness, feels much better wants to go home today.   Objective:   Blood pressure (!) 161/61, pulse 73, temperature 98.1 F (36.7 C), temperature source Oral, resp. rate (!) 21, SpO2 96 %.  No intake or output data in the 24 hours ending 06/04/20 1435  Exam Awake Alert, Oriented x 3, No new F.N deficits, Normal affect Symmetrical Chest wall movement, Good air movement bilaterally, CTAB RRR,No Gallops,Rubs or new Murmurs, No Parasternal Heave +ve B.Sounds, Abd Soft, Non tender,  No rebound -guarding or rigidity. No  Cyanosis, Clubbing or edema, No new Rash or bruise  Data Review   CBC w Diff:  Lab Results  Component Value Date   WBC 8.6 06/03/2020   HGB 11.2 (L) 06/03/2020   HGB 11.0 (L) 05/30/2020   HGB 9.9 (L) 09/29/2016   HGB 10.5 (L) 08/28/2015   HCT 36.8 06/03/2020   HCT 31.0 (L) 09/29/2016   HCT 32.7 (L) 08/28/2015   PLT 289 06/03/2020   PLT 264 05/30/2020   PLT 277 09/29/2016   LYMPHOPCT 25 06/03/2020   LYMPHOPCT 18.5 08/28/2015   MONOPCT 6 06/03/2020   MONOPCT 9.0 08/28/2015   EOSPCT 1 06/03/2020   EOSPCT 0.4 08/28/2015   BASOPCT 1 06/03/2020   BASOPCT 0.3 08/28/2015    CMP:  Lab Results  Component Value Date   NA 138 06/03/2020   NA 131 (A) 02/06/2020   NA 141 12/10/2014   K 4.1 06/03/2020   K 3.7 12/10/2014   CL 105 06/03/2020   CL 98 02/28/2009   CO2 21 (L) 06/03/2020   CO2 25 12/10/2014   BUN 24 (H) 06/03/2020   BUN 21 09/08/2016   BUN 15.7 12/10/2014   CREATININE 1.32 (H) 06/03/2020   CREATININE 1.05 (H) 05/30/2020   CREATININE 1.01 (H) 09/16/2018   CREATININE 0.9 12/10/2014   PROT 7.0 06/03/2020   PROT 6.2 10/27/2016   PROT 6.8 12/10/2014   ALBUMIN 3.8 06/03/2020   ALBUMIN 3.6 10/27/2016   ALBUMIN 3.5 12/10/2014   BILITOT 0.6 06/03/2020  BILITOT 0.5 05/30/2020   BILITOT 0.47 12/10/2014   ALKPHOS 58 06/03/2020   ALKPHOS 61 12/10/2014   AST 27 06/03/2020   AST 22 05/30/2020   AST 18 12/10/2014   ALT 19 06/03/2020   ALT 19 05/30/2020   ALT 13 12/10/2014  .   Total Time in preparing paper work, data evaluation and todays exam - 56 minutes  Phillips Climes M.D on 06/04/2020 at 2:35 PM  [facility]   Office  [phone]

## 2020-06-04 NOTE — ED Notes (Signed)
Returned from MRI 

## 2020-06-04 NOTE — Consult Note (Signed)
Neurology Consultation Reason for Consult: TIA Referring Physician: Roxanne Mins, D  CC: Right hand numbness and difficulty speaking  History is obtained from: Patient  HPI: [name] [name] is a 79 y.o. female with a history of psoriatic arthritis who presents with right hand numbness and difficulty speaking that occurred on Sunday.  She noticed that she was having some trouble with her right hand and therefore went to tell her daughter about it.  When she began speaking, she noticed that she was slurring her words and had difficulty coming up with words.  This lasted a total of only a few minutes, and completely resolved.  She did not think much of it, but mention to them passing to her primary physician today who advised her to come into the emergency department for TIA evaluation.   LKW: Sunday tpa given?: no, resolution of symptoms    ROS: A 14 point ROS was performed and is negative except as noted in the HPI.  Past Medical History:  Diagnosis Date   Anemia    of chronic disease   Anxiety    Arthritis    "psoratic arthritis; new dx" (09/11/2016)   CAD S/P percutaneous coronary angioplasty 08/2016   mLCx 90% -> 0% (2.75 mm x 16 mm) Synergy DES PCI Ostial D1 70% (not PTCA target). Moderate ostial and mD2.  LAD tandem 50% stenoses w/ negative FFR (0.86)   Cancer (HCC)    skin cancer on leg   Chronic lower back pain    Complication of anesthesia 1980 and 1982   trouble waking up after breast biopsy, many surgeries since no problem   Depression    Dyspnea on effort - CHRONIC    partial relief of Sx post PCI of L Circumflex.   Fibrocystic breast    GERD (gastroesophageal reflux disease)    Headache    "monthly" (09/11/2016)   History of blood transfusion    "several since age 62; I've had them after most of my surgeries" (09/11/2016)   History of myocardial infarction    "doctor said there are signs I've had a heart attack; don't know when"  - Myoview [name] 2018 indicated  small Inferior - Inferoseptal Infarct (vs. rest ischemia). Echo with inferoseptal hypokinesis.   Hx of skin cancer, basal cell 2013 and 2012   right inner, lower leg; several scattered around my body   Hypertension    Iron deficiency anemia    "I've had an iron infusion"   Osteoarthritis    Osteopenia    Peptic ulcer disease    H pylori   PMR (polymyalgia rheumatica) (HCC)    PONV (postoperative nausea and vomiting)    Psoriasis      Family History  Problem Relation Age of Onset   Arthritis Mother    Emphysema Mother    Hyperlipidemia Mother    Hypertension Mother    Heart disease Mother    Heart disease Brother        CAD   Diabetes Paternal Grandmother    Heart disease Maternal Aunt      Social History:  reports that she has never smoked. She has never used smokeless tobacco. She reports current alcohol use. She reports that she does not use drugs.   Exam: Current vital signs: BP (!) 153/70 (BP Location: Right Arm)    Pulse 66    Temp 98.1 F (36.7 C) (Oral)    Resp 17    SpO2 98%  Vital signs in last 24 hours:  Temp:  [97.7 F (36.5 C)-98.2 F (36.8 C)] 98.1 F (36.7 C) (10/12 0300) Pulse Rate:  [62-69] 66 (10/12 0300) Resp:  [14-18] 17 (10/12 0300) BP: (96-155)/(63-114) 153/70 (10/12 0300) SpO2:  [95 %-100 %] 98 % (10/12 0300)   Physical Exam  Constitutional: Appears well-developed and well-nourished.  Psych: Affect appropriate to situation Eyes: No scleral injection HENT: No OP obstrucion MSK: no joint deformities.  Cardiovascular: Normal rate and regular rhythm.  Respiratory: Effort normal, non-labored breathing GI: Soft.  No distension. There is no tenderness.  Skin: WDI  Neuro: Mental Status: Patient is awake, alert, oriented to person, place, month, year, and situation. Patient is able to give a clear and coherent history. No signs of aphasia or neglect Cranial Nerves: II: Visual Fields are full. Pupils are equal, round, and  reactive to light.   III,IV, VI: EOMI without ptosis or diploplia.  V: Facial sensation is symmetric to temperature VII: Facial movement is symmetric.  VIII: hearing is intact to voice X: Uvula elevates symmetrically XI: Shoulder shrug is symmetric. XII: tongue is midline without atrophy or fasciculations.  Motor: Tone is normal. Bulk is normal. 5/5 strength was present in all four extremities, though her arthritis does limit her grip and she guards her right leg due to some pain from a previous femur fracture. Sensory: Sensation is symmetric to light touch and temperature in the arms and legs. Cerebellar: FNF intact bilaterally   I have reviewed labs in epic and the results pertinent to this consultation are: Creatinine 1.3  I have reviewed the images obtained: MRI brain, MRA head-negative  Impression: 79 year old female with transient right hand numbness and dysarthria versus aphasia.  She is being admitted for TIA work-up.  She is already taking antiplatelet monotherapy with Plavix and I would favor combining it with a baby aspirin for 3 weeks followed by returning to Plavix monotherapy.  Recommendations: - HgbA1c, fasting lipid panel - Frequent neuro checks - Echocardiogram - Carotid dopplers - Prophylactic therapy-Antiplatelet med: Aspirin -81 mg daily plus Plavix 75 mg daily - Risk factor modification - Telemetry monitoring - Stroke team to follow    Roland Rack, MD [facility] [phone]  If 7pm- 7am, please page neurology on call as listed in [facility].

## 2020-06-04 NOTE — ED Notes (Signed)
Patient transported to MRI 

## 2020-06-04 NOTE — Progress Notes (Signed)
Patient arrived to 5C13 and with order to be discharge, declined TELE and SCDs monitoring at this time,. MD aware. Awaiting for d/c orders.

## 2020-06-04 NOTE — Evaluation (Signed)
Physical Therapy Evaluation and discharge Patient Details Name: [name] [name] MRN: [id number] DOB: Dec 12, 1940 Today's Date: 06/04/2020   History of Present Illness  Pt is an 79 y/o female admitted secondary to R hand numbness and speech difficulty. MRI negative for acute abnormality. Pt with femur fx in may of 2021 and is s/p ORIF. PMH includes HTN, CAD< and polymyalgia rheumatica.   Clinical Impression  Patient evaluated by Physical Therapy with no further acute PT needs identified. All education has been completed and the patient has no further questions. Pt requiring min guard to sit up on uneven stretcher, but otherwise required min guard to supervision for mobility tasks using RW. Pt and pt's daughter reports resolution of symptoms and reports pt is back to baseline. Pt just finished PT at home and does not feel like she needs more. Plans to return home with daughter.  See below for any follow-up Physical Therapy or equipment needs. PT is signing off. Thank you for this referral. If needs change, please re-consult.      Follow Up Recommendations No PT follow up    Equipment Recommendations  None recommended by PT    Recommendations for Other Services       Precautions / Restrictions Precautions Precautions: Fall Restrictions Weight Bearing Restrictions: No      Mobility  Bed Mobility Overal bed mobility: Needs Assistance Bed Mobility: Supine to Sit;Sit to Supine     Supine to sit: Min assist Sit to supine: Supervision   General bed mobility comments: Min a For trunk assist to come to sitting.   Transfers Overall transfer level: Needs assistance Equipment used: Rolling walker (2 wheeled) Transfers: Sit to/from Stand Sit to Stand: Supervision         General transfer comment: Supervision for safety.   Ambulation/Gait Ambulation/Gait assistance: Min guard;Supervision Gait Distance (Feet): 200 Feet Assistive device: Rolling walker (2 wheeled) Gait  Pattern/deviations: Step-through pattern;Decreased stride length Gait velocity: Decreased   General Gait Details: Slow, cautious gait. No LOB noted. Some fatigue noted towards end of gait, but pt reports this is baseline.   Stairs            Wheelchair Mobility    Modified Rankin (Stroke Patients Only)       Balance Overall balance assessment: No apparent balance deficits (not formally assessed)                                           Pertinent Vitals/Pain Pain Assessment: No/denies pain    Home Living Family/patient expects to be discharged to:: Private residence Living Arrangements: Children Available Help at Discharge: Family;Available 24 hours/day Type of Home: House Home Access: Stairs to enter Entrance Stairs-Rails: None Entrance Stairs-Number of Steps: 3 Home Layout: One level Home Equipment: Bedside commode;Walker - 2 wheels;Cane - single point;Shower seat;Wheelchair - manual Additional Comments: Currently living with her daughter    Prior Function Level of Independence: Needs assistance   Gait / Transfers Assistance Needed: Uses RW for mobility   ADL's / Homemaking Assistance Needed: Needs help getting into the shower        Hand Dominance   Dominant Hand: Right    Extremity/Trunk Assessment   Upper Extremity Assessment Upper Extremity Assessment: Overall WFL for tasks assessed    Lower Extremity Assessment Lower Extremity Assessment: RLE deficits/detail RLE Deficits / Details: Recent femur fx in may  Cervical / Trunk Assessment Cervical / Trunk Assessment: Normal  Communication   Communication: No difficulties  Cognition Arousal/Alertness: Awake/alert Behavior During Therapy: WFL for tasks assessed/performed Overall Cognitive Status: Within Functional Limits for tasks assessed                                        General Comments General comments (skin integrity, edema, etc.): Pt's daughter  present.     Exercises     Assessment/Plan    PT Assessment Patent does not need any further PT services  PT Problem List         PT Treatment Interventions      PT Goals (Current goals can be found in the Care Plan section)  Acute Rehab PT Goals Patient Stated Goal: to go home PT Goal Formulation: With patient Time For Goal Achievement: 06/04/20 Potential to Achieve Goals: Good    Frequency     Barriers to discharge        Co-evaluation               AM-PAC PT "6 Clicks" Mobility  Outcome Measure Help needed turning from your back to your side while in a flat bed without using bedrails?: None Help needed moving from lying on your back to sitting on the side of a flat bed without using bedrails?: A Little Help needed moving to and from a bed to a chair (including a wheelchair)?: None Help needed standing up from a chair using your arms (e.g., wheelchair or bedside chair)?: None Help needed to walk in hospital room?: None Help needed climbing 3-5 steps with a railing? : A Little 6 Click Score: 22    End of Session Equipment Utilized During Treatment: Gait belt Activity Tolerance: Patient tolerated treatment well Patient left: in bed;with call bell/phone within reach;with family/visitor present (on stretcher in ED ) Nurse Communication: Mobility status PT Visit Diagnosis: Other symptoms and signs involving the nervous system (R29.898)    Time: 8315-1761 PT Time Calculation (min) (ACUTE ONLY): 25 min   Charges:   PT Evaluation $PT Eval Low Complexity: 1 Low PT Treatments $Gait Training: 8-22 mins        Lou Miner, DPT  Acute Rehabilitation Services  Pager: [phone] Office: [phone]   Rudean Hitt 06/04/2020, 1:37 PM

## 2020-06-04 NOTE — Progress Notes (Signed)
STROKE TEAM PROGRESS NOTE   INTERVAL HISTORY Her daughter is at the bedside.  She reported on Sunday at 1p her R hand felt stiff, paralyzed. She raised it up, spoke and realized her speech was not right. She "felt funny" in her head.  Patient states she returned back to her baseline and has no deficits and wants to go home.  MRI scan of the brain negative for acute stroke and MRA of the brain shows no large vessel stenosis or occlusion.  Echocardiogram and carotid Dopplers were both unremarkable  Vitals:   06/04/20 0700 06/04/20 0757 06/04/20 0915 06/04/20 1329  BP: 110/88 (!) 165/87 139/84 (!) 161/61  Pulse: 65 64 63 73  Resp: 16 17 16  (!) 21  Temp:  97.8 F (36.6 C)  98.1 F (36.7 C)  TempSrc:  Oral  Oral  SpO2: 94% 97% 95% 96%   CBC:  Recent Labs  Lab 05/30/20 1316 06/03/20 1644  WBC 8.7 8.6  NEUTROABS 5.5 5.8  HGB 11.0* 11.2*  HCT 35.7* 36.8  MCV 86.9 87.8  PLT 264 416   Basic Metabolic Panel:  Recent Labs  Lab 05/30/20 1316 06/03/20 1644  NA 138 138  K 4.6 4.1  CL 106 105  CO2 27 21*  GLUCOSE 82 142*  BUN 22 24*  CREATININE 1.05* 1.32*  CALCIUM 9.4 9.3   Lipid Panel: No results for input(s): CHOL, TRIG, HDL, CHOLHDL, VLDL, LDLCALC in the last 168 hours. HgbA1c: No results for input(s): HGBA1C in the last 168 hours. Urine Drug Screen: No results for input(s): LABOPIA, COCAINSCRNUR, LABBENZ, AMPHETMU, THCU, LABBARB in the last 168 hours.  Alcohol Level No results for input(s): ETH in the last 168 hours.  IMAGING past 24 hours CT HEAD WO CONTRAST  Result Date: 06/03/2020 CLINICAL DATA:  Neuro deficit, acute, stroke suspected. EXAM: CT HEAD WITHOUT CONTRAST TECHNIQUE: Contiguous axial images were obtained from the base of the skull through the vertex without intravenous contrast. COMPARISON:  Head CT 05/11/2006 FINDINGS: Brain: Mild generalized cerebral atrophy. Minimal ill-defined hypoattenuation within the cerebral white matter which is nonspecific, but  consistent with chronic small vessel ischemic disease. These findings have subtly progressed as compared to the head CT of 05/11/2006. There is no acute intracranial hemorrhage. No demarcated cortical infarct. No extra-axial fluid collection. No evidence of intracranial mass. No midline shift. Vascular: No hyperdense vessel.  Atherosclerotic calcifications. Skull: Normal. Negative for fracture or focal lesion. Sinuses/Orbits: Visualized orbits show no acute finding. Minimal ethmoid sinus mucosal thickening. No significant mastoid effusion. IMPRESSION: No evidence of acute intracranial abnormality. Mild generalized cerebral atrophy and chronic small vessel ischemic disease, subtly progressed as compared to the head CT of 05/11/2006. Minimal ethmoid sinus mucosal thickening. Electronically Signed   By: Kellie Simmering DO   On: 06/03/2020 17:21   MR ANGIO HEAD WO CONTRAST  Result Date: 06/04/2020 CLINICAL DATA:  TIA. Transient right hand numbness, slurred speech, and lightheadedness. Right eye droop. EXAM: MRI HEAD WITHOUT CONTRAST MRA HEAD WITHOUT CONTRAST TECHNIQUE: Multiplanar, multiecho pulse sequences of the brain and surrounding structures were obtained without intravenous contrast. Angiographic images of the head were obtained using MRA technique without contrast. COMPARISON:  Head CT 06/03/2020 FINDINGS: MRI HEAD FINDINGS Brain: There is no evidence of an acute infarct, intracranial hemorrhage, mass, midline shift, or extra-axial fluid collection. The ventricles and sulci are within normal limits for age. Small T2 hyperintensities in the cerebral white matter bilaterally are nonspecific but compatible with mild chronic small vessel ischemic disease.  Vascular: Major intracranial vascular flow voids are preserved. Skull and upper cervical spine: Unremarkable bone marrow signal. Sinuses/Orbits: Bilateral cataract extraction. Paranasal sinuses and mastoid air cells are clear. Other: None. MRA HEAD FINDINGS The  visualized distal vertebral arteries are widely patent to the basilar. The right PICA and left AICA appear dominant. Patent SCAs are seen bilaterally. The basilar artery is widely patent. Posterior communicating arteries are not clearly identified and may be diminutive or absent. Both PCAs are patent without evidence of a significant proximal stenosis. The internal carotid arteries are patent from skull base to carotid termini with atherosclerotic irregularity of the cavernous segments but no evidence of a significant stenosis. ACAs and MCAs are patent without evidence of a proximal branch occlusion or significant proximal stenosis. No aneurysm is identified. IMPRESSION: 1. No acute intracranial abnormality. 2. Mild chronic small vessel ischemic disease. 3. No major intracranial arterial occlusion or significant proximal stenosis. Electronically Signed   By: Logan Bores M.D.   On: 06/04/2020 06:13   MR BRAIN WO CONTRAST  Result Date: 06/04/2020 CLINICAL DATA:  TIA. Transient right hand numbness, slurred speech, and lightheadedness. Right eye droop. EXAM: MRI HEAD WITHOUT CONTRAST MRA HEAD WITHOUT CONTRAST TECHNIQUE: Multiplanar, multiecho pulse sequences of the brain and surrounding structures were obtained without intravenous contrast. Angiographic images of the head were obtained using MRA technique without contrast. COMPARISON:  Head CT 06/03/2020 FINDINGS: MRI HEAD FINDINGS Brain: There is no evidence of an acute infarct, intracranial hemorrhage, mass, midline shift, or extra-axial fluid collection. The ventricles and sulci are within normal limits for age. Small T2 hyperintensities in the cerebral white matter bilaterally are nonspecific but compatible with mild chronic small vessel ischemic disease. Vascular: Major intracranial vascular flow voids are preserved. Skull and upper cervical spine: Unremarkable bone marrow signal. Sinuses/Orbits: Bilateral cataract extraction. Paranasal sinuses and mastoid  air cells are clear. Other: None. MRA HEAD FINDINGS The visualized distal vertebral arteries are widely patent to the basilar. The right PICA and left AICA appear dominant. Patent SCAs are seen bilaterally. The basilar artery is widely patent. Posterior communicating arteries are not clearly identified and may be diminutive or absent. Both PCAs are patent without evidence of a significant proximal stenosis. The internal carotid arteries are patent from skull base to carotid termini with atherosclerotic irregularity of the cavernous segments but no evidence of a significant stenosis. ACAs and MCAs are patent without evidence of a proximal branch occlusion or significant proximal stenosis. No aneurysm is identified. IMPRESSION: 1. No acute intracranial abnormality. 2. Mild chronic small vessel ischemic disease. 3. No major intracranial arterial occlusion or significant proximal stenosis. Electronically Signed   By: Logan Bores M.D.   On: 06/04/2020 06:13   ECHOCARDIOGRAM COMPLETE  Result Date: 06/04/2020    ECHOCARDIOGRAM REPORT   Patient Name:   [name] [name] Date of Exam: 06/04/2020 Medical Rec #:  [id number]       Height:       63.0 in Accession #:    [id number]      Weight:       175.3 lb Date of Birth:  1940-09-18        BSA:          1.828 m Patient Age:    79 years        BP:           165/87 mmHg Patient Gender: F               HR:  64 bpm. Exam Location:  Inpatient Procedure: 2D Echo, Cardiac Doppler and Color Doppler Indications:    TIA  History:        Patient has prior history of Echocardiogram examinations, most                 recent 09/07/2017. CAD and Previous Myocardial Infarction,                 Signs/Symptoms:Dyspnea; Risk Factors:Hypertension.  Sonographer:    Dustin Flock Referring Phys: [phone] AMY N [facility] IMPRESSIONS  1. Left ventricular ejection fraction, by estimation, is 60 to 65%. The left ventricle has normal function. The left ventricle has no regional wall motion  abnormalities. There is mild concentric left ventricular hypertrophy. Left ventricular diastolic parameters are consistent with Grade I diastolic dysfunction (impaired relaxation). Elevated left atrial pressure.  2. Right ventricular systolic function is normal. The right ventricular size is normal. There is normal pulmonary artery systolic pressure. The estimated right ventricular systolic pressure is 29.9 mmHg.  3. The mitral valve is normal in structure. No evidence of mitral valve regurgitation. No evidence of mitral stenosis.  4. The aortic valve is normal in structure. There is mild calcification of the aortic valve, primarily on the noncoronary cusp. Aortic valve regurgitation is trivial. Mild aortic valve sclerosis is present, with no evidence of aortic valve stenosis.  5. The inferior vena cava is normal in size with greater than 50% respiratory variability, suggesting right atrial pressure of 3 mmHg. FINDINGS  Left Ventricle: Left ventricular ejection fraction, by estimation, is 60 to 65%. The left ventricle has normal function. The left ventricle has no regional wall motion abnormalities. The left ventricular internal cavity size was normal in size. There is  mild concentric left ventricular hypertrophy. Left ventricular diastolic parameters are consistent with Grade I diastolic dysfunction (impaired relaxation). Elevated left atrial pressure. Right Ventricle: The right ventricular size is normal. No increase in right ventricular wall thickness. Right ventricular systolic function is normal. There is normal pulmonary artery systolic pressure. The tricuspid regurgitant velocity is 2.55 m/s, and  with an assumed right atrial pressure of 3 mmHg, the estimated right ventricular systolic pressure is 37.1 mmHg. Left Atrium: Left atrial size was normal in size. Right Atrium: Right atrial size was normal in size. Pericardium: Trivial pericardial effusion is present. Mitral Valve: The mitral valve is normal in  structure. No evidence of mitral valve regurgitation. No evidence of mitral valve stenosis. Tricuspid Valve: The tricuspid valve is normal in structure. Tricuspid valve regurgitation is trivial. No evidence of tricuspid stenosis. Aortic Valve: The aortic valve is normal in structure. There is mild calcification of the aortic valve. Aortic valve regurgitation is trivial. Mild aortic valve sclerosis is present, with no evidence of aortic valve stenosis. Pulmonic Valve: The pulmonic valve was normal in structure. Pulmonic valve regurgitation is not visualized. No evidence of pulmonic stenosis. Aorta: The aortic root is normal in size and structure. Venous: The inferior vena cava is normal in size with greater than 50% respiratory variability, suggesting right atrial pressure of 3 mmHg. IAS/Shunts: No atrial level shunt detected by color flow Doppler.  LEFT VENTRICLE PLAX 2D LVIDd:         3.50 cm  Diastology LVIDs:         2.60 cm  LV e' medial:    6.96 cm/s LV PW:         1.30 cm  LV E/e' medial:  11.7 LV IVS:  1.30 cm  LV e' lateral:   4.57 cm/s LVOT diam:     1.90 cm  LV E/e' lateral: 17.8 LV SV:         69 LV SV Index:   38 LVOT Area:     2.84 cm  RIGHT VENTRICLE RV Basal diam:  2.90 cm RV S prime:     13.90 cm/s TAPSE (M-mode): 3.0 cm LEFT ATRIUM             Index       RIGHT ATRIUM           Index LA diam:        3.00 cm 1.64 cm/m  RA Area:     13.70 cm LA Vol (A2C):   58.6 ml 32.05 ml/m RA Volume:   31.80 ml  17.39 ml/m LA Vol (A4C):   43.1 ml 23.57 ml/m LA Biplane Vol: 50.5 ml 27.62 ml/m  AORTIC VALVE LVOT Vmax:   117.00 cm/s LVOT Vmean:  87.100 cm/s LVOT VTI:    0.244 m  AORTA Ao Root diam: 3.00 cm MITRAL VALVE                TRICUSPID VALVE MV Area (PHT): 4.60 cm     TR Peak grad:   26.0 mmHg MV Decel Time: 165 msec     TR Vmax:        255.00 cm/s MV E velocity: 81.50 cm/s MV A velocity: 125.00 cm/s  SHUNTS MV E/A ratio:  0.65         Systemic VTI:  0.24 m                             Systemic  Diam: 1.90 cm Dani Gobble Croitoru MD Electronically signed by Sanda Klein MD Signature Date/Time: 06/04/2020/1:14:57 PM    Final    VAS US CAROTID  Result Date: 06/04/2020 Carotid Arterial Duplex Study Indications:       TIA and Syncope. Risk Factors:      Hypertension. Comparison Study:  MRI done today Performing Technologist: Griffin Basil RCT RDMS  Examination Guidelines: A complete evaluation includes B-mode imaging, spectral Doppler, color Doppler, and power Doppler as needed of all accessible portions of each vessel. Bilateral testing is considered an integral part of a complete examination. Limited examinations for reoccurring indications may be performed as noted.  Right Carotid Findings: +----------+--------+--------+--------+------------------+------------------+           PSV cm/sEDV cm/sStenosisPlaque DescriptionComments           +----------+--------+--------+--------+------------------+------------------+ CCA Prox  103     12                                                   +----------+--------+--------+--------+------------------+------------------+ CCA Distal93      12                                intimal thickening +----------+--------+--------+--------+------------------+------------------+ ICA Prox  126     34      1-39%                     intimal thickening +----------+--------+--------+--------+------------------+------------------+ ICA Distal130     30                                                   +----------+--------+--------+--------+------------------+------------------+  ECA       97      1                                                    +----------+--------+--------+--------+------------------+------------------+ +----------+--------+-------+--------+-------------------+           PSV cm/sEDV cmsDescribeArm Pressure (mmHG) +----------+--------+-------+--------+-------------------+ Subclavian204     6                                   +----------+--------+-------+--------+-------------------+ +---------+--------+--+--------+--+---------+ VertebralPSV cm/s74EDV cm/s18Antegrade +---------+--------+--+--------+--+---------+  Left Carotid Findings: +----------+--------+--------+--------+------------------+------------------+           PSV cm/sEDV cm/sStenosisPlaque DescriptionComments           +----------+--------+--------+--------+------------------+------------------+ CCA Prox  83      12                                                   +----------+--------+--------+--------+------------------+------------------+ CCA Distal79      17                                intimal thickening +----------+--------+--------+--------+------------------+------------------+ ICA Prox  72      16      1-39%                     intimal thickening +----------+--------+--------+--------+------------------+------------------+ ICA Distal115     37                                                   +----------+--------+--------+--------+------------------+------------------+ ECA       85      2                                                    +----------+--------+--------+--------+------------------+------------------+ +----------+--------+--------+--------+-------------------+           PSV cm/sEDV cm/sDescribeArm Pressure (mmHG) +----------+--------+--------+--------+-------------------+ [id number]     1                                   +----------+--------+--------+--------+-------------------+ +---------+--------+--+--------+--+---------+ VertebralPSV cm/s84EDV cm/s22Antegrade +---------+--------+--+--------+--+---------+   Summary: Right Carotid: Velocities in the right ICA are consistent with a 1-39% stenosis. Left Carotid: Velocities in the left ICA are consistent with a 1-39% stenosis. Vertebrals: Bilateral vertebral arteries demonstrate antegrade flow. *See table(s) above for  measurements and observations.  Electronically signed by Deitra Mayo MD on 06/04/2020 at 2:16:36 PM.    Final     PHYSICAL EXAM Pleasant elderly Caucasian lady not in distress. . Afebrile. Head is nontraumatic. Neck is supple without bruit.    Cardiac exam no murmur or gallop. Lungs are clear to auscultation. Distal pulses are well felt. Neurological Exam ;  Awake  Alert oriented x 3.  Normal speech and language.eye movements full without nystagmus.fundi were not visualized. Vision acuity and fields appear normal. Hearing is normal. Palatal movements are normal. Face symmetric. Tongue midline. Normal strength, tone, reflexes and coordination. Normal sensation. Gait deferred.   ASSESSMENT/PLAN [name] [name] is a 79 y.o. female with history of psoriatic arthritis presenting with R hand numbness and difficulty speaking on Sunday.   L brain TIA  CT head No acute abnormality. Small vessel disease. Atrophy. Sinus dz  MRI  No acute abnormality. Small vessel disease.   MRA  Unremarkable   Carotid Doppler  B ICA 1-39% stenosis, VAs antegrade   2D Echo EF 60-65%. No source of embolus   LDL 55  HgbA1c 5.3  VTE prophylaxis - SCDs   clopidogrel 75 mg daily prior to admission, now on clopidogrel 75 mg daily. Add aspirin 81 x 3 weeks then plavix alone   Therapy recommendations:  No therapy needs  Disposition:  Return home  Hypertension  Stable . BP goal normotensive  Hyperlipidemia  Home meds:  lipitor 20, resumed in hospital  LDL 55, goal < 70  Continue statin at discharge  Other Stroke Risk Factors  Advanced age  ETOH use, advised to drink no more than 1 drink(s) a day  Obesity  Coronary artery disease hx PCI 2018  Other Active Problems  Anxiety   GERD  Anemia of chronic dz  Hospital day # 0  She presented with transient weakness in the right hand and stiffness as well as some speech difficulties likely TIA secondary to small vessel disease.   Recommend aspirin Plavix for 3 weeks followed by Plavix alone.  Aggressive risk factor modification.  Discussed with Dr. Emeline Gins.  Greater than 50% time during this 25-minute visit was spent on counseling and coordination of care about TIA and stroke prevention and answering questions. Antony Contras, MD To contact Stroke Continuity provider, please refer to [email or web address] After hours, contact General Neurology

## 2020-06-04 NOTE — Progress Notes (Signed)
PT Cancellation Note  Patient Details Name: [name] [name] MRN: [id number] DOB: 1940-09-09   Cancelled Treatment:    Reason Eval/Treat Not Completed: Patient at procedure or test/unavailable Pt currently having echo. Will follow up as schedule allows.   Lou Miner, DPT  Acute Rehabilitation Services  Pager: [phone] Office: [phone]    Rudean Hitt 06/04/2020, 10:54 AM

## 2020-06-04 NOTE — Progress Notes (Signed)
OT Cancellation Note  Patient Details Name: [name] [name] MRN: [id number] DOB: 1940/10/10   Cancelled Treatment:    Reason Eval/Treat Not Completed: OT screened, no needs identified, will sign off; spoke with evaluating PT, reports pt at her baseline function with ADL/mobility; planning to return home with daughter assist. Acute OT to sign off. Please re-consult should pt's needs change.  [name] [name], OT Acute Rehabilitation Services Pager [phone] Office [phone]   Raymondo Band 06/04/2020, 1:35 PM

## 2020-06-04 NOTE — Progress Notes (Signed)
Carotid study completed.   See CVProc for preliminary results.   Kiran Carline, RDMS, RVT 

## 2020-06-04 NOTE — ED Provider Notes (Signed)
[facility] EMERGENCY DEPARTMENT Provider Note   CSN: [id number] Arrival date & time: 06/03/20  1555   History Chief Complaint  Patient presents with   Transient Ischemic Attack    [name] [name] is a 79 y.o. female.  The history is provided by the patient.  She has history of hypertension, coronary artery disease and comes in because of an episode of numbness in the right hand and difficulty speaking.  This episode occurred on 10/10 and lasted about 5 minutes.  Her right hand felt numb and she was having difficulty forming the words that she wanted to say.  She denies headache.  She did not notice any weakness.  She has not had any recurrence of symptoms.  She did go to urgent care where she was noted to have a right eye droop and was sent here with concerns for possible stroke.  Past Medical History:  Diagnosis Date   Anemia    of chronic disease   Anxiety    Arthritis    "psoratic arthritis; new dx" (09/11/2016)   CAD S/P percutaneous coronary angioplasty 08/2016   mLCx 90% -> 0% (2.75 mm x 16 mm) Synergy DES PCI Ostial D1 70% (not PTCA target). Moderate ostial and mD2.  LAD tandem 50% stenoses w/ negative FFR (0.86)   Cancer (HCC)    skin cancer on leg   Chronic lower back pain    Complication of anesthesia 1980 and 1982   trouble waking up after breast biopsy, many surgeries since no problem   Depression    Dyspnea on effort - CHRONIC    partial relief of Sx post PCI of L Circumflex.   Fibrocystic breast    GERD (gastroesophageal reflux disease)    Headache    "monthly" (09/11/2016)   History of blood transfusion    "several since age 73; I've had them after most of my surgeries" (09/11/2016)   History of myocardial infarction    "doctor said there are signs I've had a heart attack; don't know when"  - Myoview [name] 2018 indicated small Inferior - Inferoseptal Infarct (vs. rest ischemia). Echo with inferoseptal hypokinesis.   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   Routine general medical examination at a health care facility 10/14/2017   Preop cardiovascular exam 10/01/2017   CAD S/P percutaneous coronary angioplasty 01/27/2017   Aortic regurgitation 01/27/2017   Dyspnea on exertion 08/19/2016   Left bundle branch block (LBBB) on electrocardiogram 08/19/2016   Estrogen deficiency 10/08/2015   Chronic fatigue 08/20/2015   Deficiency anemia 11/17/2013   Encounter for Medicare annual wellness exam 07/17/2013   PMR (polymyalgia rheumatica) ([place]) 07/17/2013   Intertrigo 04/19/2013   Expected blood loss anemia 04/05/2013   Obesity (BMI 30-39.9) 04/05/2013   S/P right THA, AA 04/04/2013   IBS (irritable bowel syndrome) 05/30/2012  Anemia of chronic disease 04/16/2010   Generalized anxiety disorder 04/16/2010   GERD 03/04/2009   Vitamin D deficiency 03/12/2008   URINARY TRACT INFECTION, RECURRENT 08/07/2007   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   FIBROCYSTIC BREAST DISEASE, HX OF 02/11/2007   Hyperlipidemia with low HDL 01/10/2007   DEPRESSION 01/10/2007   RETINAL VEIN OCCLUSION 01/10/2007   Essential hypertension 01/10/2007   PEPTIC ULCER DISEASE 01/10/2007   Shoulder arthritis 01/10/2007   SKIN CANCER, HX OF 01/10/2007    Past Surgical History:  Procedure Laterality Date   APPENDECTOMY     BACK SURGERY     x 6    BREAST  BIOPSY Left 1980 and 1982   "benign"   CARDIAC CATHETERIZATION N/A 09/11/2016   Procedure: Left Heart Cath and Coronary Angiography;  Surgeon: Leonie Man, MD;  Location: [place] [facility];  Service: Cardiovascular: mLCx 90% * (PCI). Ostial D1 70% (not PTCA target). Mod ostial and mD2.  LAD tandem 50% stenoses w/ negative FFR (0.86)   CARDIAC CATHETERIZATION N/A 09/11/2016   Procedure: Intravascular Pressure Wire/FFR Study;  Surgeon: Leonie Man, MD;  Location: [place] [facility];  Service: Cardiovascular: --  LAD tandem 50% stenoses w/ negative FFR (0.86   CARDIAC CATHETERIZATION N/A 09/11/2016   Procedure: Coronary Stent Intervention;  Surgeon: Leonie Man, MD;  Location: [place] [facility];  Service: Cardiovascular: -- DES PCI mLCx 90%--0% SYNERGY DES 2.75 MM X 16 MM.   CARDIAC CATHETERIZATION  2001   non-obstructive CAD   CATARACT EXTRACTION W/ INTRAOCULAR LENS  IMPLANT, BILATERAL  July -  August 2017   DILATION AND CURETTAGE OF UTERUS  X 3   ESOPHAGOGASTRODUODENOSCOPY  03/2009   ulcer,HP, GERD stricture   EXCISIONAL HEMORRHOIDECTOMY     HIP ARTHROPLASTY Left 2010   JOINT REPLACEMENT     KNEE ARTHROSCOPY Bilateral    LAPAROSCOPIC CHOLECYSTECTOMY  2005   LUMBAR [place] SURGERY  11/1984; 1987; 1989; 2004; 2005 X 2   deg. disk lumber spine    MOHS SURGERY Right    "inside my lower leg"   NASAL SINUS SURGERY  1970s   NM MYOVIEW LTD  08/28/2016   INTERMEDIATE RISK (b/c reduced EF & ? small infarct) -- EF ~35-40%.  ? Prior Small Inferior-apical & septal infarct (w/o ischemia).  Difuse HK - worse in inferoapex.  Suggest prior infarct with no ischemia. Ischemic area correlates with echo WMA   ORIF FEMUR FRACTURE Right 01/04/2020   Procedure: OPEN REDUCTION INTERNAL FIXATION (ORIF) DISTAL FEMUR FRACTURE;  Surgeon: Rod Can, MD;  Location: [place];  Service: Orthopedics;  Laterality: Right;   REVERSE SHOULDER ARTHROPLASTY Left 12/17/2017   Procedure: LEFT REVERSE  SHOULDER ARTHROPLASTY;  Surgeon: Netta Cedars, MD;  Location: [place];  Service: Orthopedics;  Laterality: Left;   TOTAL HIP ARTHROPLASTY Right 04/04/2013   Procedure: RIGHT TOTAL HIP ARTHROPLASTY ANTERIOR APPROACH;  Surgeon: Mauri Pole, MD;  Location: [facility];  Service: Orthopedics;  Laterality: Right;   TOTAL KNEE ARTHROPLASTY Right 2009   TRANSTHORACIC ECHOCARDIOGRAM  08/2016   mild conc LVH. EF 45-50% - anteroseptal & inferoseptal HK. Mod AI.   TRANSTHORACIC ECHOCARDIOGRAM  08/2017   (post PCI) --> EF normalized:  Normal LV systolic function; mild diastolic dysfunction; mild   LVH; sclerotic aortic valve with mild AI.  No RWMA   TUBAL LIGATION     VAGINAL HYSTERECTOMY  1970s   partial      OB History   No obstetric history on file.  Family History  Problem Relation Age of Onset   Arthritis Mother    Emphysema Mother    Hyperlipidemia Mother    Hypertension Mother    Heart disease Mother    Heart disease Brother        CAD   Diabetes Paternal Grandmother    Heart disease Maternal Aunt     Social History   Tobacco Use   Smoking status: Never Smoker   Smokeless tobacco: Never Used  Vaping Use   Vaping Use: Never used  Substance Use Topics   Alcohol use: Yes    Alcohol/week: 0.0 standard drinks    Comment: 09/11/2016 "might have a margarita q 3-4 months"   Drug use: No    Home Medications Prior to Admission medications   Medication Sig Start Date End Date Taking? Authorizing Provider  acetaminophen (TYLENOL) 500 MG tablet Take 1,000 mg by mouth 2 (two) times daily as needed for moderate pain or headache.     [provider]  atorvastatin (LIPITOR) 20 MG tablet TAKE 1 TABLET BY MOUTH EVERY DAY Patient taking differently: Take 20 mg by mouth daily.  10/11/19   Leonie Man, MD  busPIRone (BUSPAR) 15 MG tablet Take 1 tablet (15 mg total) by mouth daily. 04/23/20   Tower, [name], MD  cetirizine (ZYRTEC) 10 MG tablet Take 1 tablet (10  mg total) by mouth daily. 06/16/19   Elby Beck, FNP  clopidogrel (PLAVIX) 75 MG tablet TAKE 1 TABLET BY MOUTH EVERY DAY WITH BREAKFAST Patient taking differently: Take 75 mg by mouth daily.  11/27/19   Leonie Man, MD  HYDROcodone-acetaminophen (NORCO/VICODIN) 5-325 MG tablet Take 1 tablet by mouth 3 (three) times daily as needed for moderate pain. 03/08/20   Nicholas Lose, MD  losartan (COZAAR) 25 MG tablet Take 1 tablet (25 mg total) by mouth daily. 05/06/20   Tower, [name], MD  Melatonin 3 MG CAPS Take 18 mg by mouth at bedtime.     [provider]  methocarbamol (ROBAXIN) 500 MG tablet TAKE 1 TABLET BY MOUTH EVERY 8 HOURS AS NEEDED FOR MUSCLE SPASMS. 05/28/20   Tower, [name], MD  metoprolol tartrate (LOPRESSOR) 25 MG tablet TAKE 1 TABLET BY MOUTH TWICE A DAY Patient taking differently: Take 25 mg by mouth 2 (two) times daily.  11/22/19   Leonie Man, MD  pantoprazole (PROTONIX) 40 MG tablet Take 1 tablet (40 mg total) by mouth daily. 05/23/19   Leonie Man, MD  sertraline (ZOLOFT) 25 MG tablet Take 1 tablet (25 mg total) by mouth daily. 02/02/20   Tower, [name], MD  Vitamin D, Cholecalciferol, 25 MCG (1000 UT) CAPS Take 2,000 Units by mouth in the morning and at bedtime.     [provider]    Allergies    Sulfonamide derivatives, Ace inhibitors, Hydrochlorothiazide, Oxaprozin, Pregabalin, and Amoxicillin-pot clavulanate  Review of Systems   Review of Systems  All other systems reviewed and are negative.   Physical Exam Updated Vital Signs BP (!) 153/70 (BP Location: Right Arm)    Pulse 66    Temp 98.1 F (36.7 C) (Oral)    Resp 17    SpO2 98%   Physical Exam Vitals and nursing note reviewed.   79 year old female, resting comfortably and in no acute distress. Vital signs are significant for elevated blood pressure. Oxygen saturation is 98%, which is normal. Head is normocephalic and atraumatic. PERRLA, EOMI. Oropharynx is clear.  Mild right  ptosis is noted.  There is no facial droop.  Tongue protrudes in the midline. Neck is nontender and supple without adenopathy or JVD.  There are no carotid bruits. Back is nontender and there is no CVA tenderness. Lungs are clear without rales, wheezes, or rhonchi. Chest is nontender. Heart has regular rate and rhythm without murmur. Abdomen is soft, flat, nontender without masses or hepatosplenomegaly and peristalsis is normoactive. Extremities have no cyanosis or edema, full range of motion is present.  Heberden's and Bouchard's nodes are present. Skin is warm and dry without rash. Neurologic: Mental status is normal.  Speech is normal.  Cranial nerves are intact with the exception of mild right ptosis.  Motor strength is 5/5 in all 4 extremities.  There is no pronator drift.  There is no extinction on double simultaneous stimulation.  Finger-to-nose testing is normal.  ED Results / Procedures / Treatments   Labs (all labs ordered are listed, but only abnormal results are displayed) Labs Reviewed  CBC - Abnormal; Notable for the following components:      Result Value   Hemoglobin 11.2 (*)    All other components within normal limits  COMPREHENSIVE METABOLIC PANEL - Abnormal; Notable for the following components:   CO2 21 (*)    Glucose, Bld 142 (*)    BUN 24 (*)    Creatinine, Ser 1.32 (*)    GFR, Estimated 38 (*)    All other components within normal limits  RESPIRATORY PANEL BY RT PCR (FLU A&B, COVID)  PROTIME-INR  APTT  DIFFERENTIAL  I-STAT CHEM 8, ED  CBG MONITORING, ED    EKG LIANNA, SITZMANN GY:[id number] 03-Jun-2020 13:06:24 [facility] ROUTINE RECORD Sinus bradycardia Minimal voltage criteria for LVH, may be normal variant ( Cornell product ) Borderline ECG When compared with ECG of 01/04/2020, HEART RATE has decreased Confirmed by Delora Fuel ([id number]) on 06/04/2020 12:35:33 AM 34mm/s 71mm/mV 100Hz  9.0.4 12SL 243 CID: [id number] Referred by: Roque Lias  TOWER Confirmed By: Delora Fuel Vent. rate 56 BPM PR interval 184 ms QRS duration 102 ms QT/QTc 452/436 ms P-R-T axes 22 -29 0 1941/04/01 (79 yr) Female Caucasian Room:04 Loc:19 Technician: [phone] Test DHR:CBUL stroke symptoms  Radiology CT HEAD WO CONTRAST  Result Date: 06/03/2020 CLINICAL DATA:  Neuro deficit, acute, stroke suspected. EXAM: CT HEAD WITHOUT CONTRAST TECHNIQUE: Contiguous axial images were obtained from the base of the skull through the vertex without intravenous contrast. COMPARISON:  Head CT 05/11/2006 FINDINGS: Brain: Mild generalized cerebral atrophy. Minimal ill-defined hypoattenuation within the cerebral white matter which is nonspecific, but consistent with chronic small vessel ischemic disease. These findings have subtly progressed as compared to the head CT of 05/11/2006. There is no acute intracranial hemorrhage. No demarcated cortical infarct. No extra-axial fluid collection. No evidence of intracranial mass. No midline shift. Vascular: No hyperdense vessel.  Atherosclerotic calcifications. Skull: Normal. Negative for fracture or focal lesion. Sinuses/Orbits: Visualized orbits show no acute finding. Minimal ethmoid sinus mucosal thickening. No significant mastoid effusion. IMPRESSION: No evidence of acute intracranial abnormality. Mild generalized cerebral atrophy and chronic small vessel ischemic disease, subtly progressed as compared to the head CT of 05/11/2006. Minimal ethmoid sinus mucosal thickening. Electronically Signed   By: Kellie Simmering DO   On: 06/03/2020 17:21    Procedures Procedures  Medications Ordered in ED Medications  sodium chloride flush (NS) 0.9 % injection 3 mL (has no administration in time range)  morphine 4 MG/ML injection 4 mg (has no administration in time range)  ED Course  I have reviewed the triage vital signs and the nursing notes.  Pertinent labs & imaging results that were available during my care of the patient were  reviewed by me and considered in my medical decision making (see chart for details).  MDM Rules/Calculators/A&P Transient ischemic attack.  Prior to my seeing her, CT of head was obtained showing no acute process.  Labs show mild renal insufficiency which is slightly worse than 4 days ago, mild anemia is present and unchanged.  MRI of the brain and MRA of the head have been ordered.  Case is discussed with Dr. Tobie Poet of [facility], who agrees to admit the patient.  Case also discussed with Dr. Leonel Ramsay, neuro hospitalist, who agrees to see the patient in consultation.  Final Clinical Impression(s) / ED Diagnoses Final diagnoses:  TIA (transient ischemic attack)  Renal insufficiency  Normochromic normocytic anemia    Rx / DC Orders ED Discharge Orders    None       Delora Fuel, MD 12/78/71 0630

## 2020-06-04 NOTE — H&P (Signed)
History and Physical   [name] [name] JME:[id number] DOB: [birth year] DOA: 06/03/2020  PCP: Abner Greenspan, MD  Patient coming from: home  I have personally briefly reviewed patient's old medical records in [place].  Chief Concern: TIA  HPI: [name] [name] is a 79 y.o. female with medical history significant for CAD s/p DES to mid circumflex in (09/11/2016), hypertension, baseline dyspnea with exertion, generalized anxiety disorder, hld, presents with chief concern of right hand numbness and difficulty speaking that started on Sunday, 06/02/20 in the AM.   She did not want to get evaluated for this. On Monday, after calling her pcp for an unrelated matter, daughter informed pcp office of her symptoms and was advised to present to ED for evaluation as soon as possible.   She reports never experiencing these symptoms before. ROS was negative for F/C/N/V/D, headache, vision changes, changes to baseline shortness of breath, chest pain, abdominal pain, changes to urinary/bowel habits.   She has received her covid vaccine.   ED Course: Discussed with Ed provider. ED provider spoke with neurology for TIA. CT head negative for acute changes. MRI ordered  Vitals stable in the ED on room air.   Review of Systems: As per HPI otherwise 10 point review of systems negative.  Past Medical History:  Diagnosis Date  . Anemia    of chronic disease  . Anxiety   . Arthritis    "psoratic arthritis; new dx" (09/11/2016)  . CAD S/P percutaneous coronary angioplasty 08/2016   mLCx 90% -> 0% (2.75 mm x 16 mm) Synergy DES PCI Ostial D1 70% (not PTCA target). Moderate ostial and mD2.  LAD tandem 50% stenoses w/ negative FFR (0.86)  . Cancer ([place])    skin cancer on leg  . Chronic lower back pain   . Complication of anesthesia 1980 and 1982   trouble waking up after breast biopsy, many surgeries since no problem  . Depression   . Dyspnea on effort - CHRONIC    partial relief of Sx post PCI of L  Circumflex.  . Fibrocystic breast   . GERD (gastroesophageal reflux disease)   . Headache    "monthly" (09/11/2016)  . History of blood transfusion    "several since age 66; I've had them after most of my surgeries" (09/11/2016)  . History of myocardial infarction    "doctor said there are signs I've had a heart attack; don't know when"  - Myoview [name] 2018 indicated small Inferior - Inferoseptal Infarct (vs. rest ischemia). Echo with inferoseptal hypokinesis.  Marland Kitchen Hx of skin cancer, basal cell 2013 and 2012   right inner, lower leg; several scattered around my body  . Hypertension   . Iron deficiency anemia    "I've had an iron infusion"  . Osteoarthritis   . Osteopenia   . Peptic ulcer disease    H pylori  . PMR (polymyalgia rheumatica) (HCC)   . PONV (postoperative nausea and vomiting)   . Psoriasis    Past Surgical History:  Procedure Laterality Date  . APPENDECTOMY    . BACK SURGERY     x 6   . BREAST BIOPSY Left 1980 and 1982   "benign"  . CARDIAC CATHETERIZATION N/A 09/11/2016   Procedure: Left Heart Cath and Coronary Angiography;  Surgeon: Leonie Man, MD;  Location: [facility];  Service: Cardiovascular: mLCx 90% * (PCI). Ostial D1 70% (not PTCA target). Mod ostial and mD2.  LAD tandem 50% stenoses w/ negative  FFR (0.86)  . CARDIAC CATHETERIZATION N/A 09/11/2016   Procedure: Intravascular Pressure Wire/FFR Study;  Surgeon: Leonie Man, MD;  Location: [facility];  Service: Cardiovascular: --  LAD tandem 50% stenoses w/ negative FFR (0.86  . CARDIAC CATHETERIZATION N/A 09/11/2016   Procedure: Coronary Stent Intervention;  Surgeon: Leonie Man, MD;  Location: [facility];  Service: Cardiovascular: -- DES PCI mLCx 90%--0% SYNERGY DES 2.75 MM X 16 MM.  Marland Kitchen CARDIAC CATHETERIZATION  2001   non-obstructive CAD  . CATARACT EXTRACTION W/ INTRAOCULAR LENS  IMPLANT, BILATERAL  July -  August 2017  . DILATION AND CURETTAGE OF UTERUS  X 3  .  ESOPHAGOGASTRODUODENOSCOPY  03/2009   ulcer,HP, GERD stricture  . EXCISIONAL HEMORRHOIDECTOMY    . HIP ARTHROPLASTY Left 2010  . JOINT REPLACEMENT    . KNEE ARTHROSCOPY Bilateral   . LAPAROSCOPIC CHOLECYSTECTOMY  2005  . LUMBAR DISC SURGERY  11/1984; 1987; 1989; 2004; 2005 X 2   deg. disk lumber spine   . MOHS SURGERY Right    "inside my lower leg"  . NASAL SINUS SURGERY  1970s  . NM MYOVIEW LTD  08/28/2016   INTERMEDIATE RISK (b/c reduced EF & ? small infarct) -- EF ~35-40%.  ? Prior Small Inferior-apical & septal infarct (w/o ischemia).  Difuse HK - worse in inferoapex.  Suggest prior infarct with no ischemia. Ischemic area correlates with echo WMA  . ORIF FEMUR FRACTURE Right 01/04/2020   Procedure: OPEN REDUCTION INTERNAL FIXATION (ORIF) DISTAL FEMUR FRACTURE;  Surgeon: Rod Can, MD;  Location: [facility];  Service: Orthopedics;  Laterality: Right;  . REVERSE SHOULDER ARTHROPLASTY Left 12/17/2017   Procedure: LEFT REVERSE SHOULDER ARTHROPLASTY;  Surgeon: Netta Cedars, MD;  Location: [facility];  Service: Orthopedics;  Laterality: Left;  . TOTAL HIP ARTHROPLASTY Right 04/04/2013   Procedure: RIGHT TOTAL HIP ARTHROPLASTY ANTERIOR APPROACH;  Surgeon: Mauri Pole, MD;  Location: [facility];  Service: Orthopedics;  Laterality: Right;  . TOTAL KNEE ARTHROPLASTY Right 2009  . TRANSTHORACIC ECHOCARDIOGRAM  08/2016   mild conc LVH. EF 45-50% - anteroseptal & inferoseptal HK. Mod AI.  Marland Kitchen TRANSTHORACIC ECHOCARDIOGRAM  08/2017   (post PCI) --> EF normalized:  Normal LV systolic function; mild diastolic dysfunction; mild   LVH; sclerotic aortic valve with mild AI.  No RWMA  . TUBAL LIGATION    . VAGINAL HYSTERECTOMY  1970s   partial    Social History:  reports that she has never smoked. She has never used smokeless tobacco. She reports current alcohol use. She reports that she does not use drugs.  Allergies  Allergen Reactions  . Sulfonamide Derivatives Other (See Comments)    crystalized kidneys  as child  . Ace Inhibitors     cough  . Hydrochlorothiazide     Hyponatremia   . Oxaprozin     GI upset   . Pregabalin Swelling    SWELLING REACTION UNSPECIFIED   . Amoxicillin-Pot Clavulanate Nausea And Vomiting    gi upset Has patient had a PCN reaction causing immediate rash, facial/tongue/throat swelling, SOB or lightheadedness with hypotension: No Has patient had a PCN reaction causing severe rash involving mucus membranes or skin necrosis: No Has patient had a PCN reaction that required hospitalization No Has patient had a PCN reaction occurring within the last 10 years: No If all of the above answers are "NO", then may proceed with Cephalosporin use.    Family History  Problem Relation Age of Onset  .  Arthritis Mother   . Emphysema Mother   . Hyperlipidemia Mother   . Hypertension Mother   . Heart disease Mother   . Heart disease Brother        CAD  . Diabetes Paternal Grandmother   . Heart disease Maternal Aunt    Family history: Family history reviewed, extensive heart disease in family   Prior to Admission medications   Medication Sig Start Date End Date Taking? Authorizing Provider  acetaminophen (TYLENOL) 500 MG tablet Take 1,000 mg by mouth 2 (two) times daily as needed for moderate pain or headache.     [provider]  atorvastatin (LIPITOR) 20 MG tablet TAKE 1 TABLET BY MOUTH EVERY DAY Patient taking differently: Take 20 mg by mouth daily.  10/11/19   Leonie Man, MD  busPIRone (BUSPAR) 15 MG tablet Take 1 tablet (15 mg total) by mouth daily. 04/23/20   Tower, [name], MD  cetirizine (ZYRTEC) 10 MG tablet Take 1 tablet (10 mg total) by mouth daily. 06/16/19   Elby Beck, FNP  clopidogrel (PLAVIX) 75 MG tablet TAKE 1 TABLET BY MOUTH EVERY DAY WITH BREAKFAST Patient taking differently: Take 75 mg by mouth daily.  11/27/19   Leonie Man, MD  HYDROcodone-acetaminophen (NORCO/VICODIN) 5-325 MG tablet Take 1 tablet by mouth 3 (three) times  daily as needed for moderate pain. 03/08/20   Nicholas Lose, MD  losartan (COZAAR) 25 MG tablet Take 1 tablet (25 mg total) by mouth daily. 05/06/20   Tower, [name], MD  Melatonin 3 MG CAPS Take 18 mg by mouth at bedtime.     [provider]  methocarbamol (ROBAXIN) 500 MG tablet TAKE 1 TABLET BY MOUTH EVERY 8 HOURS AS NEEDED FOR MUSCLE SPASMS. 05/28/20   Tower, [name], MD  metoprolol tartrate (LOPRESSOR) 25 MG tablet TAKE 1 TABLET BY MOUTH TWICE A DAY Patient taking differently: Take 25 mg by mouth 2 (two) times daily.  11/22/19   Leonie Man, MD  pantoprazole (PROTONIX) 40 MG tablet Take 1 tablet (40 mg total) by mouth daily. 05/23/19   Leonie Man, MD  sertraline (ZOLOFT) 25 MG tablet Take 1 tablet (25 mg total) by mouth daily. 02/02/20   Tower, [name], MD  Vitamin D, Cholecalciferol, 25 MCG (1000 UT) CAPS Take 2,000 Units by mouth in the morning and at bedtime.     [provider]   Physical Exam: Vitals:   06/03/20 2015 06/03/20 2221 06/04/20 0156 06/04/20 0300  BP: (!) 146/68 (!) 153/72 (!) 155/64 (!) 153/70  Pulse: 66 69 64 66  Resp: 18 18 18 17   Temp:   98 F (36.7 C) 98.1 F (36.7 C)  TempSrc:   Oral Oral  SpO2: 98% 98% 95% 98%   Constitutional: pleasant 79 year old female, NAD, calm, comfortable with mild expressive aphasia, however, this could be due to tired for lack of sleep Eyes: PERRL, lids and conjunctivae normal ENMT: Mucous membranes are moist. Posterior pharynx clear of any exudate or lesions.Normal dentition.  Neck: normal, supple, no masses, no thyromegaly Respiratory: clear to auscultation bilaterally, no wheezing, no crackles. Normal respiratory effort. No accessory muscle use.  Cardiovascular: Regular rate and rhythm, no murmurs / rubs / gallops. No extremity edema. 2+ pedal pulses. No carotid bruits.  Abdomen: no tenderness, no masses palpated. No hepatosplenomegaly. Bowel sounds positive.  Musculoskeletal: no clubbing / cyanosis. No  joint deformity upper and lower extremities. Good ROM, no contractures. Normal muscle tone.  Skin: no rashes, lesions, ulcers. No induration Neurologic: CN 2-12 grossly intact. Sensation intact, Strength 5/5 in all 4.  Psychiatric: Normal judgment and insight. Alert and oriented x 3. Normal mood.   Labs on Admission: I have personally reviewed following labs and imaging studies  CBC: Recent Labs  Lab 05/30/20 1316 06/03/20 1644  WBC 8.7 8.6  NEUTROABS 5.5 5.8  HGB 11.0* 11.2*  HCT 35.7* 36.8  MCV 86.9 87.8  PLT 264 983   Basic Metabolic Panel: Recent Labs  Lab 05/30/20 1316 06/03/20 1644  NA 138 138  K 4.6 4.1  CL 106 105  CO2 27 21*  GLUCOSE 82 142*  BUN 22 24*  CREATININE 1.05* 1.32*  CALCIUM 9.4 9.3   GFR: CrCl cannot be calculated (Unknown ideal weight.). Liver Function Tests: Recent Labs  Lab 05/30/20 1316 06/03/20 1644  AST 22 27  ALT 19 19  ALKPHOS 72 58  BILITOT 0.5 0.6  PROT 7.2 7.0  ALBUMIN 3.6 3.8   No results for input(s): LIPASE, AMYLASE in the last 168 hours. No results for input(s): AMMONIA in the last 168 hours. Coagulation Profile: Recent Labs  Lab 06/03/20 1644  INR 1.0   Urine analysis:    Component Value Date/Time   COLORURINE YELLOW 06/18/2019 2231   APPEARANCEUR HAZY (A) 06/18/2019 2231   LABSPEC 1.018 06/18/2019 2231   LABSPEC 1.010 11/17/2013 1237   PHURINE 5.0 06/18/2019 2231   GLUCOSEU NEGATIVE 06/18/2019 2231   GLUCOSEU Negative 11/17/2013 1237   HGBUR NEGATIVE 06/18/2019 2231   HGBUR small 06/03/2007 [place] 06/18/2019 2231   BILIRUBINUR Negative 11/17/2013 [place] 06/18/2019 2231   PROTEINUR NEGATIVE 06/18/2019 2231   UROBILINOGEN 0.2 11/17/2013 1237   NITRITE NEGATIVE 06/18/2019 2231   LEUKOCYTESUR MODERATE (A) 06/18/2019 2231   LEUKOCYTESUR Trace 11/17/2013 1237   Radiological Exams on Admission: Personally reviewed and I agree with radiologist reading as below.  CT HEAD  WO CONTRAST  Result Date: 06/03/2020 CLINICAL DATA:  Neuro deficit, acute, stroke suspected. EXAM: CT HEAD WITHOUT CONTRAST TECHNIQUE: Contiguous axial images were obtained from the base of the skull through the vertex without intravenous contrast. COMPARISON:  Head CT 05/11/2006 FINDINGS: Brain: Mild generalized cerebral atrophy. Minimal ill-defined hypoattenuation within the cerebral white matter which is nonspecific, but consistent with chronic small vessel ischemic disease. These findings have subtly progressed as compared to the head CT of 05/11/2006. There is no acute intracranial hemorrhage. No demarcated cortical infarct. No extra-axial fluid collection. No evidence of intracranial mass. No midline shift. Vascular: No hyperdense vessel.  Atherosclerotic calcifications. Skull: Normal. Negative for fracture or focal lesion. Sinuses/Orbits: Visualized orbits show no acute finding. Minimal ethmoid sinus mucosal thickening. No significant mastoid effusion. IMPRESSION: No evidence of acute intracranial abnormality. Mild generalized cerebral atrophy and chronic small vessel ischemic disease, subtly progressed as compared to the head CT of 05/11/2006. Minimal ethmoid sinus mucosal thickening. Electronically Signed   By: Kellie Simmering DO   On: 06/03/2020 17:21   EKG: Independently reviewed, showing sinus bradycardia   Assessment/Plan  Principal Problem:   TIA (transient ischemic attack) Active Problems:   Hyperlipidemia with low HDL   Generalized anxiety disorder   Essential hypertension   Dyspnea on exertion   CAD S/P percutaneous coronary angioplasty   # TIA - - CT head was negative for hemorrhage - MRI of brain w/o contrast and MRA of head ordered - Neurology has been consulted by ED provider - Aspiration and  fall precautions - Echo ordered - Neuro checks - Lipid panel and A1c ordered  # h/o CAD s/p DES to mid circumflex in 09/11/2016 # Hypertension  - Statin therapy - currently on  moderate dosing, follow with cardiology  - Losartan 25 mg daily, metoprolol tartrate 25 mg BID  - Continue DAPT - plavix and asa (per cardiology note in 08/10/18 - she can hold plavix for 5 days for procedures)   # HLD - atorvastatin 20 mg qhs # GERD - pantoprazole 40 mg daily  # Anxiety/depression - buspirone 15 mg daily, sertraline 25 mg daily # Anemia of chronic disease - follows with oncology   DVT prophylaxis: holding as she is on DAPT and had a TIA Code Status: full Diet: npo pending swallow study Family Communication: discussed with daughter at bedside Disposition Plan: pending neurology recommendation and clinical course Consults called: neurology Admission status: obeservation  Rocklyn Mayberry N Janesia Joswick D.O. [facility]  If 7AM-7PM, please contact day-coverage provider [email or web address]  06/04/2020, 5:48 AM

## 2020-06-04 NOTE — Discharge Instructions (Signed)
Follow with Primary MD Tower, [name] Fanny, MD in 7 days   Get CBC, CMP,  by Primary MD next visit.    Activity: As tolerated with Full fall precautions use walker/cane & assistance as needed   Disposition Home    Diet: Heart Healthy  , with feeding assistance and aspiration precautions.  For Heart failure patients - Check your Weight same time everyday, if you gain over 2 pounds, or you develop in leg swelling, experience more shortness of breath or chest pain, call your Primary MD immediately. Follow Cardiac Low Salt Diet and 1.5 lit/day fluid restriction.   On your next visit with your primary care physician please Get Medicines reviewed and adjusted.   Please request your Prim.MD to go over all Hospital Tests and Procedure/Radiological results at the follow up, please get all Hospital records sent to your Prim MD by signing hospital release before you go home.   If you experience worsening of your admission symptoms, develop shortness of breath, life threatening emergency, suicidal or homicidal thoughts you must seek medical attention immediately by calling 911 or calling your MD immediately  if symptoms less severe.  You Must read complete instructions/literature along with all the possible adverse reactions/side effects for all the Medicines you take and that have been prescribed to you. Take any new Medicines after you have completely understood and accpet all the possible adverse reactions/side effects.   Do not drive, operating heavy machinery, perform activities at heights, swimming or participation in water activities or provide baby sitting services if your were admitted for syncope or siezures until you have seen by Primary MD or a Neurologist and advised to do so again.  Do not drive when taking Pain medications.    Do not take more than prescribed Pain, Sleep and Anxiety Medications  Special Instructions: If you have smoked or chewed Tobacco  in the last 2 yrs please stop  smoking, stop any regular Alcohol  and or any Recreational drug use.  Wear Seat belts while driving.   Please note  You were cared for by a hospitalist during your hospital stay. If you have any questions about your discharge medications or the care you received while you were in the hospital after you are discharged, you can call the unit and asked to speak with the hospitalist on call if the hospitalist that took care of you is not available. Once you are discharged, your primary care physician will handle any further medical issues. Please note that NO REFILLS for any discharge medications will be authorized once you are discharged, as it is imperative that you return to your primary care physician (or establish a relationship with a primary care physician if you do not have one) for your aftercare needs so that they can reassess your need for medications and monitor your lab values.

## 2020-06-04 NOTE — ED Notes (Signed)
Pt ambulatory to restroom with walker and standby by assist.

## 2020-06-11 ENCOUNTER — Ambulatory Visit: Payer: Medicare Other | Admitting: Family Medicine

## 2020-06-11 ENCOUNTER — Other Ambulatory Visit: Payer: Self-pay

## 2020-06-11 ENCOUNTER — Encounter: Payer: Self-pay | Admitting: Family Medicine

## 2020-06-11 VITALS — BP 132/70 | HR 63 | Temp 96.9°F | Ht 63.0 in | Wt 175.2 lb

## 2020-06-11 DIAGNOSIS — I10 Essential (primary) hypertension: Secondary | ICD-10-CM | POA: Diagnosis not present

## 2020-06-11 DIAGNOSIS — E786 Lipoprotein deficiency: Secondary | ICD-10-CM

## 2020-06-11 DIAGNOSIS — G459 Transient cerebral ischemic attack, unspecified: Secondary | ICD-10-CM | POA: Diagnosis not present

## 2020-06-11 DIAGNOSIS — E669 Obesity, unspecified: Secondary | ICD-10-CM

## 2020-06-11 DIAGNOSIS — D638 Anemia in other chronic diseases classified elsewhere: Secondary | ICD-10-CM | POA: Diagnosis not present

## 2020-06-11 DIAGNOSIS — E785 Hyperlipidemia, unspecified: Secondary | ICD-10-CM | POA: Diagnosis not present

## 2020-06-11 NOTE — Assessment & Plan Note (Signed)
Disc goals for lipids and reasons to control them Rev last labs with pt Rev low sat fat diet in detail LDL at goal of 55 (under 70) in light of recent tia Taking atorvastatin 20 and watching diet

## 2020-06-11 NOTE — Progress Notes (Signed)
Subjective:    Patient ID: [name] [name], female    DOB: 1941-04-10, 79 y.o.   MRN: [id number]  This visit occurred during the SARS-CoV-2 public health emergency.  Safety protocols were in place, including screening questions prior to the visit, additional usage of staff PPE, and extensive cleaning of exam room while observing appropriate contact time as indicated for disinfecting solutions.    HPI Pt presents for hospitalization f/u   Wt Readings from Last 3 Encounters:  06/11/20 175 lb 3 oz (79.5 kg)  05/06/20 175 lb 5 oz (79.5 kg)  05/02/20 172 lb 9.6 oz (78.3 kg)   31.03 kg/m   She as hospitalized from 10/11 to 06/04/20 for suspected TIA  She presented with symptoms of R hand numbness and difficulty speaking since 10/10 in the am (she noticed then when going thru a drive through for [employer])  Lasted 2-3 minutes at the most (along with a funny feeling in head)   Hospital course as follows Hospital Course  TIA - - CT head was negative for hemorrhage MRI brain and MRA head with no acute intracranial abnormalities, no major intracranial arterial occlusion or significant proximal stenosis, Dopplers with no significant stenosis as well, 2D echo with no embolic source, EKG with normal sinus rhythm, neurology input greatly appreciated, recommendation for dual antiplatelet therapy aspirin and Plavix, then Plavix alone, she is already on statin he was seen by PT SLP, no home health needed.  # h/o CAD s/p DES to mid circumflex in 09/11/2016 # Hypertension  - Statin therapy - currently on moderate dosing, follow with cardiology  - Losartan 25 mg daily, metoprolol tartrate 25 mg BID  -Continue with Plavix after finishing dual antiplatelet therapy for 3 weeks  # HLD - atorvastatin 20 mg qhs to have lipid panel as an outpatient # GERD - pantoprazole 40 mg daily  # Anxiety/depression -continue with home medications # Anemia of chronic disease - follows with oncology   Lab Results   Component Value Date   WBC 8.6 06/03/2020   HGB 11.2 (L) 06/03/2020   HCT 36.8 06/03/2020   MCV 87.8 06/03/2020   PLT 289 06/03/2020   Lab Results  Component Value Date   CREATININE 1.32 (H) 06/03/2020   BUN 24 (H) 06/03/2020   NA 138 06/03/2020   K 4.1 06/03/2020   CL 105 06/03/2020   CO2 21 (L) 06/03/2020   Lab Results  Component Value Date   ALT 19 06/03/2020   AST 27 06/03/2020   ALKPHOS 58 06/03/2020   BILITOT 0.6 06/03/2020     HTN Taking losartan 25 mg and metoprolol 25 mg bid BP Readings from Last 3 Encounters:  06/11/20 (!) 142/70  06/04/20 (!) 161/61  06/03/20 (!) 121/94   Re check sitting BP: 132/70   Pulse Readings from Last 3 Encounters:  06/11/20 63  06/04/20 73  06/03/20 62   Hyperlipidemia  Taking atorvastatin 20 mg daily  Lab Results  Component Value Date   CHOL 136 10/27/2019   HDL 44.70 10/27/2019   LDLCALC 55 10/27/2019   LDLDIRECT 35.0 10/19/2018   TRIG 182.0 (H) 10/27/2019   CHOLHDL 3 10/27/2019   Today- head feels pressure /like a balloon -not pain  Not dizzy   Patient Active Problem List   Diagnosis Date Noted  . TIA (transient ischemic attack) 06/04/2020  . Normochromic normocytic anemia   . Closed fracture of left distal femur ([place]) 02/04/2020  . Renal insufficiency 11/01/2019  . Pedal edema  10/04/2018  . Grief reaction 09/17/2018  . 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 Routine general medical examination at a health care facility 10/14/2017  . Preop cardiovascular exam 10/01/2017  . CAD S/P percutaneous coronary angioplasty 01/27/2017  . Aortic regurgitation 01/27/2017  . Dyspnea on exertion 08/19/2016  . Left bundle branch block (LBBB) on electrocardiogram 08/19/2016  . Estrogen deficiency 10/08/2015  . Chronic fatigue 08/20/2015  . Deficiency anemia 11/17/2013  . Encounter for Medicare annual wellness exam 07/17/2013  . PMR (polymyalgia rheumatica) (HCC) 07/17/2013  . Intertrigo 04/19/2013  . Expected  blood loss anemia 04/05/2013  . Obesity (BMI 30-39.9) 04/05/2013  . 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 IBS (irritable bowel syndrome) 05/30/2012  . Anemia of chronic disease 04/16/2010  . Generalized anxiety disorder 04/16/2010  . GERD 03/04/2009  . Vitamin D deficiency 03/12/2008  . URINARY TRACT INFECTION, RECURRENT 08/07/2007  . 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 FIBROCYSTIC BREAST DISEASE, HX OF 02/11/2007  . Hyperlipidemia with low HDL 01/10/2007  . DEPRESSION 01/10/2007  . RETINAL VEIN OCCLUSION 01/10/2007  . Essential hypertension 01/10/2007  . PEPTIC ULCER DISEASE 01/10/2007  . 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 SKIN CANCER, HX OF 01/10/2007   Past Medical History:  Diagnosis Date  . Anemia    of chronic disease  . Anxiety   . Arthritis    "psoratic arthritis; new dx" (09/11/2016)  . CAD S/P percutaneous coronary angioplasty 08/2016   mLCx 90% -> 0% (2.75 mm x 16 mm) Synergy DES PCI Ostial D1 70% (not PTCA target). Moderate ostial and mD2.  LAD tandem 50% stenoses w/ negative FFR (0.86)  . Cancer ([place])    skin cancer on leg  . Chronic lower back pain   . Complication of anesthesia 1980 and 1982   trouble waking up after breast biopsy, many surgeries since no problem  . Depression   . Dyspnea on effort - CHRONIC    partial relief of Sx post PCI of L Circumflex.  . Fibrocystic breast   . GERD (gastroesophageal reflux disease)   . Headache    "monthly" (09/11/2016)  . History of blood transfusion    "several since age 59; I've had them after most of my surgeries" (09/11/2016)  . History of myocardial infarction    "doctor said there are signs I've had a heart attack; don't know when"  - Myoview [name] 2018 indicated small Inferior - Inferoseptal Infarct (vs. rest ischemia). Echo with inferoseptal hypokinesis.  Marland Kitchen Hx of skin cancer, basal cell 2013 and 2012   right inner, lower leg; several scattered around my body  . Hypertension   . Iron deficiency anemia    "I've had  an iron infusion"  . Osteoarthritis   . Osteopenia   . Peptic ulcer disease    H pylori  . PMR (polymyalgia rheumatica) (HCC)   . PONV (postoperative nausea and vomiting)   . Psoriasis    Past Surgical History:  Procedure Laterality Date  . APPENDECTOMY    . BACK SURGERY     x 6   . BREAST BIOPSY Left 1980 and 1982   "benign"  . CARDIAC CATHETERIZATION N/A 09/11/2016   Procedure: Left Heart Cath and Coronary Angiography;  Surgeon: Leonie Man, MD;  Location: [facility];  Service: Cardiovascular: mLCx 90% * (PCI). Ostial D1 70% (not PTCA target). Mod ostial and mD2.  LAD tandem 50% stenoses w/ negative FFR (0.86)  . CARDIAC CATHETERIZATION N/A 09/11/2016   Procedure: Intravascular Pressure  Wire/FFR Study;  Surgeon: Leonie Man, MD;  Location: [facility];  Service: Cardiovascular: --  LAD tandem 50% stenoses w/ negative FFR (0.86  . CARDIAC CATHETERIZATION N/A 09/11/2016   Procedure: Coronary Stent Intervention;  Surgeon: Leonie Man, MD;  Location: [facility];  Service: Cardiovascular: -- DES PCI mLCx 90%--0% SYNERGY DES 2.75 MM X 16 MM.  Marland Kitchen CARDIAC CATHETERIZATION  2001   non-obstructive CAD  . CATARACT EXTRACTION W/ INTRAOCULAR LENS  IMPLANT, BILATERAL  July -  August 2017  . DILATION AND CURETTAGE OF UTERUS  X 3  . ESOPHAGOGASTRODUODENOSCOPY  03/2009   ulcer,HP, GERD stricture  . EXCISIONAL HEMORRHOIDECTOMY    . HIP ARTHROPLASTY Left 2010  . JOINT REPLACEMENT    . KNEE ARTHROSCOPY Bilateral   . LAPAROSCOPIC CHOLECYSTECTOMY  2005  . LUMBAR DISC SURGERY  11/1984; 1987; 1989; 2004; 2005 X 2   deg. disk lumber spine   . MOHS SURGERY Right    "inside my lower leg"  . NASAL SINUS SURGERY  1970s  . NM MYOVIEW LTD  08/28/2016   INTERMEDIATE RISK (b/c reduced EF & ? small infarct) -- EF ~35-40%.  ? Prior Small Inferior-apical & septal infarct (w/o ischemia).  Difuse HK - worse in inferoapex.  Suggest prior infarct with no ischemia. Ischemic area  correlates with echo WMA  . ORIF FEMUR FRACTURE Right 01/04/2020   Procedure: OPEN REDUCTION INTERNAL FIXATION (ORIF) DISTAL FEMUR FRACTURE;  Surgeon: Rod Can, MD;  Location: [facility];  Service: Orthopedics;  Laterality: Right;  . REVERSE SHOULDER ARTHROPLASTY Left 12/17/2017   Procedure: LEFT REVERSE SHOULDER ARTHROPLASTY;  Surgeon: Netta Cedars, MD;  Location: [facility];  Service: Orthopedics;  Laterality: Left;  . TOTAL HIP ARTHROPLASTY Right 04/04/2013   Procedure: RIGHT TOTAL HIP ARTHROPLASTY ANTERIOR APPROACH;  Surgeon: Mauri Pole, MD;  Location: [facility];  Service: Orthopedics;  Laterality: Right;  . TOTAL KNEE ARTHROPLASTY Right 2009  . TRANSTHORACIC ECHOCARDIOGRAM  08/2016   mild conc LVH. EF 45-50% - anteroseptal & inferoseptal HK. Mod AI.  Marland Kitchen TRANSTHORACIC ECHOCARDIOGRAM  08/2017   (post PCI) --> EF normalized:  Normal LV systolic function; mild diastolic dysfunction; mild   LVH; sclerotic aortic valve with mild AI.  No RWMA  . TUBAL LIGATION    . VAGINAL HYSTERECTOMY  1970s   partial    Social History   Tobacco Use  . Smoking status: Never Smoker  . Smokeless tobacco: Never Used  Vaping Use  . Vaping Use: Never used  Substance Use Topics  . Alcohol use: Yes    Alcohol/week: 0.0 standard drinks    Comment: 09/11/2016 "might have a margarita q 3-4 months"  . Drug use: No   Family History  Problem Relation Age of Onset  . Arthritis Mother   . Emphysema Mother   . Hyperlipidemia Mother   . Hypertension Mother   . Heart disease Mother   . Heart disease Brother        CAD  . Diabetes Paternal Grandmother   . Heart disease Maternal Aunt    Allergies  Allergen Reactions  . 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 Ace Inhibitors     cough  . Hydrochlorothiazide     Hyponatremia   . Oxaprozin     GI upset   . Pregabalin Swelling    SWELLING REACTION UNSPECIFIED   . Amoxicillin-Pot Clavulanate Nausea And Vomiting    gi  upset  Has patient had a PCN reaction causing immediate rash, facial/tongue/throat swelling, SOB or lightheadedness with hypotension: No Has patient had a PCN reaction causing severe rash involving mucus membranes or skin necrosis: No Has patient had a PCN reaction that required hospitalization No Has patient had a PCN reaction occurring within the last 10 years: No If all of the above answers are "NO", then may proceed with Cephalosporin use.    Current Outpatient Medications on File Prior to Visit  Medication Sig Dispense Refill  . acetaminophen (TYLENOL) 500 MG tablet Take 1,000 mg by mouth 2 (two) times daily as needed for moderate pain or headache.     Marland Kitchen aspirin EC 81 MG EC tablet Take 1 tablet (81 mg total) by mouth daily for 21 days. Swallow whole. 21 tablet 0  . atorvastatin (LIPITOR) 20 MG tablet TAKE 1 TABLET BY MOUTH EVERY DAY (Patient taking differently: Take 20 mg by mouth daily. ) 90 tablet 3  . busPIRone (BUSPAR) 15 MG tablet Take 1 tablet (15 mg total) by mouth daily. 90 tablet 3  . cetirizine (ZYRTEC) 10 MG tablet Take 1 tablet (10 mg total) by mouth daily. 30 tablet 11  . clopidogrel (PLAVIX) 75 MG tablet TAKE 1 TABLET BY MOUTH EVERY DAY WITH BREAKFAST (Patient taking differently: Take 75 mg by mouth daily. ) 90 tablet 3  . HYDROcodone-acetaminophen (NORCO/VICODIN) 5-325 MG tablet Take 1 tablet by mouth 3 (three) times daily as needed for moderate pain. 30 tablet 0  . losartan (COZAAR) 25 MG tablet Take 1 tablet (25 mg total) by mouth daily. 90 tablet 3  . Melatonin 3 MG CAPS Take 6 mg by mouth at bedtime.     . methocarbamol (ROBAXIN) 500 MG tablet TAKE 1 TABLET BY MOUTH EVERY 8 HOURS AS NEEDED FOR MUSCLE SPASMS. (Patient taking differently: Take 500 mg by mouth every 8 (eight) hours as needed for muscle spasms. ) 30 tablet 1  . metoprolol tartrate (LOPRESSOR) 25 MG tablet TAKE 1 TABLET BY MOUTH TWICE A DAY (Patient taking differently: Take 25 mg by mouth 2 (two) times daily. )  180 tablet 1  . pantoprazole (PROTONIX) 40 MG tablet Take 1 tablet (40 mg total) by mouth daily. 90 tablet 1  . sertraline (ZOLOFT) 25 MG tablet Take 1 tablet (25 mg total) by mouth daily. 90 tablet 1  . Vitamin D, Cholecalciferol, 25 MCG (1000 UT) CAPS Take 2,000 Units by mouth in the morning and at bedtime.      No current facility-administered medications on file prior to visit.    Review of Systems  Constitutional: Negative for activity change, appetite change, fatigue, fever and unexpected weight change.  HENT: Negative for congestion, ear pain, rhinorrhea, sinus pressure and sore throat.   Eyes: Negative for pain, redness and visual disturbance.  Respiratory: Negative for cough, shortness of breath and wheezing.   Cardiovascular: Negative for chest pain and palpitations.  Gastrointestinal: Negative for abdominal pain, blood in stool, constipation and diarrhea.  Endocrine: Negative for polydipsia and polyuria.  Genitourinary: Negative for dysuria, frequency and urgency.  Musculoskeletal: Negative for arthralgias, back pain and myalgias.  Skin: Negative for pallor and rash.  Allergic/Immunologic: Negative for environmental allergies.  Neurological: Negative for dizziness, syncope, facial asymmetry, weakness, light-headedness and headaches.       Walks with walker for balance   TIA symptoms are totally resolved   Hematological: Negative for adenopathy. Does not bruise/bleed easily.  Psychiatric/Behavioral: Negative for decreased concentration and dysphoric mood. The patient is  not nervous/anxious.        Objective:   Physical Exam Constitutional:      General: She is not in acute distress.    Appearance: Normal appearance. She is well-developed. She is obese. She is not ill-appearing or diaphoretic.  HENT:     Head: Normocephalic and atraumatic.  Eyes:     Conjunctiva/sclera: Conjunctivae normal.     Pupils: Pupils are equal, round, and reactive to light.  Neck:     Thyroid:  No thyromegaly.     Vascular: No carotid bruit or JVD.  Cardiovascular:     Rate and Rhythm: Normal rate and regular rhythm.     Heart sounds: Normal heart sounds. No gallop.   Pulmonary:     Effort: Pulmonary effort is normal. No respiratory distress.     Breath sounds: Normal breath sounds. No wheezing or rales.  Abdominal:     General: Bowel sounds are normal. There is no distension or abdominal bruit.     Palpations: Abdomen is soft. There is no mass.     Tenderness: There is no abdominal tenderness.  Musculoskeletal:     Cervical back: Normal range of motion and neck supple.     Right lower leg: No edema.     Left lower leg: No edema.  Lymphadenopathy:     Cervical: No cervical adenopathy.  Skin:    General: Skin is warm and dry.     Coloration: Skin is not pale.     Findings: No bruising, erythema or rash.  Neurological:     Mental Status: She is alert.     Cranial Nerves: No cranial nerve deficit, dysarthria or facial asymmetry.     Motor: Motor function is intact. No weakness, tremor, abnormal muscle tone or pronator drift.     Deep Tendon Reflexes: Reflexes are normal and symmetric. Reflexes normal.     Comments: Poor balance baseline, walks well with a walker            Assessment & Plan:   Problem List Items Addressed This Visit      Cardiovascular and Mediastinum   Essential hypertension (Chronic)    bp in fair control at this time  BP Readings from Last 1 Encounters:  06/11/20 132/70   No changes needed Most recent labs reviewed  Disc lifstyle change with low sodium diet and exercise  Continues metoprolol 25 mg bid and losartan 25 mg daily  Recent TIA aware      TIA (transient ischemic attack) - Primary    Reviewed hospital records, lab results and studies in detail  No residual symptoms  plavix and asa for 3 weeks -then will continue plavix w/o asa  Rev s/s to watch for  bp and cholesterol controlled  No embolic source found (? Small vessel  origin) For cardiology f/u soon        Other   Hyperlipidemia with low HDL (Chronic)    Disc goals for lipids and reasons to control them Rev last labs with pt Rev low sat fat diet in detail LDL at goal of 55 (under 70) in light of recent tia Taking atorvastatin 20 and watching diet       Anemia of chronic disease (Chronic)    Very well controlled under hematology care Recent Hb 11.2      Obesity (BMI 30-39.9)    Discussed how this problem influences overall health and the risks it imposes  Reviewed plan for weight loss with lower calorie diet (  via better food choices and also portion control or program like weight watchers) and exercise building up to or more than 30 minutes 5 days per week including some aerobic activity   Exercise is limited-walks with walker

## 2020-06-11 NOTE — Assessment & Plan Note (Signed)
bp in fair control at this time  BP Readings from Last 1 Encounters:  06/11/20 132/70   No changes needed Most recent labs reviewed  Disc lifstyle change with low sodium diet and exercise  Continues metoprolol 25 mg bid and losartan 25 mg daily  Recent TIA aware

## 2020-06-11 NOTE — Patient Instructions (Addendum)
Continue the plavix and aspirin for 3 weeks  Then stop the aspirin and continue the plavix  Glad you are doing well   We will work to keep BP and cholesterol controlled   Take care of yourself   Please keep [name] posted/ let us know if symptoms return

## 2020-06-11 NOTE — Assessment & Plan Note (Signed)
Very well controlled under hematology care Recent Hb 11.2

## 2020-06-11 NOTE — Assessment & Plan Note (Signed)
Reviewed hospital records, lab results and studies in detail  No residual symptoms  plavix and asa for 3 weeks -then will continue plavix w/o asa  Rev s/s to watch for  bp and cholesterol controlled  No embolic source found (? Small vessel origin) For cardiology f/u soon

## 2020-06-11 NOTE — Assessment & Plan Note (Signed)
Discussed how this problem influences overall health and the risks it imposes  Reviewed plan for weight loss with lower calorie diet (via better food choices and also portion control or program like weight watchers) and exercise building up to or more than 30 minutes 5 days per week including some aerobic activity   Exercise is limited-walks with walker

## 2020-06-18 ENCOUNTER — Other Ambulatory Visit: Payer: Self-pay | Admitting: Cardiology

## 2020-06-21 ENCOUNTER — Other Ambulatory Visit: Payer: Self-pay | Admitting: Family Medicine

## 2020-06-21 DIAGNOSIS — J302 Other seasonal allergic rhinitis: Secondary | ICD-10-CM

## 2020-06-24 NOTE — Telephone Encounter (Signed)
Pharmacy requests refill on: Cetirizine 10mg    LAST REFILL: 06/16/2019 LAST OV: 06/11/2020 for TIA  NEXT OV: 11/06/2019 PHARMACY: [facility] [address]  Okay to refill X 1 year.

## 2020-06-26 ENCOUNTER — Other Ambulatory Visit: Payer: Self-pay | Admitting: Cardiology

## 2020-06-26 ENCOUNTER — Other Ambulatory Visit: Payer: Self-pay | Admitting: Family Medicine

## 2020-06-26 NOTE — Progress Notes (Signed)
Patient Care Team: Tower, [name], MD as PCP - General Ellyn Hack Leonie Green, MD as PCP - Cardiology (Cardiology) Suella Broad, MD as Consulting Physician (Physical Medicine and Rehabilitation) Gavin Pound, MD as Consulting Physician (Rheumatology) Haverstock, Jennefer Bravo, MD as Referring Physician (Dermatology) Nicholas Lose, MD as Consulting Physician (Hematology and Oncology)  DIAGNOSIS:    ICD-10-CM   1. Anemia of chronic disease  D63.8     CHIEF COMPLIANT: Follow-up of anemia  INTERVAL HISTORY: [name] [name] is a 79 y.o. with above-mentioned history of anemia whocurrentlyreceives Retacrit.She presents to the clinic todayfor follow-upand Retacrit.  She is doing reasonably well.  She does feel mildly tired.  Denies any shortness of breath fatigue dizziness etc.  ALLERGIES:  is allergic to sulfonamide derivatives, ace inhibitors, hydrochlorothiazide, oxaprozin, pregabalin, and amoxicillin-pot clavulanate.  MEDICATIONS:  Current Outpatient Medications  Medication Sig Dispense Refill  . acetaminophen (TYLENOL) 500 MG tablet Take 1,000 mg by mouth 2 (two) times daily as needed for moderate pain or headache.     Marland Kitchen atorvastatin (LIPITOR) 20 MG tablet TAKE 1 TABLET BY MOUTH EVERY DAY (Patient taking differently: Take 20 mg by mouth daily. ) 90 tablet 3  . busPIRone (BUSPAR) 15 MG tablet Take 1 tablet (15 mg total) by mouth daily. 90 tablet 3  . cetirizine (ZYRTEC) 10 MG tablet TAKE 1 TABLET BY MOUTH EVERY DAY 90 tablet 3  . clopidogrel (PLAVIX) 75 MG tablet TAKE 1 TABLET BY MOUTH EVERY DAY WITH BREAKFAST (Patient taking differently: Take 75 mg by mouth daily. ) 90 tablet 3  . HYDROcodone-acetaminophen (NORCO/VICODIN) 5-325 MG tablet Take 1 tablet by mouth 3 (three) times daily as needed for moderate pain. 30 tablet 0  . losartan (COZAAR) 25 MG tablet Take 1 tablet (25 mg total) by mouth daily. 90 tablet 3  . Melatonin 3 MG CAPS Take 6 mg by mouth at bedtime.     . methocarbamol  (ROBAXIN) 500 MG tablet TAKE 1 TABLET BY MOUTH EVERY 8 HOURS AS NEEDED FOR MUSCLE SPASMS. (Patient taking differently: Take 500 mg by mouth every 8 (eight) hours as needed for muscle spasms. ) 30 tablet 1  . metoprolol tartrate (LOPRESSOR) 25 MG tablet TAKE 1 TABLET BY MOUTH TWICE A DAY 180 tablet 2  . pantoprazole (PROTONIX) 40 MG tablet TAKE 1 TABLET BY MOUTH EVERY DAY 90 tablet 1  . sertraline (ZOLOFT) 25 MG tablet TAKE 1 TABLET BY MOUTH EVERY DAY 90 tablet 1  . Vitamin D, Cholecalciferol, 25 MCG (1000 UT) CAPS Take 2,000 Units by mouth in the morning and at bedtime.      No current facility-administered medications for this visit.    PHYSICAL EXAMINATION: ECOG PERFORMANCE STATUS: 1 - Symptomatic but completely ambulatory  Vitals:   06/27/20 1203  BP: 128/65  Pulse: 61  Resp: 17  Temp: 98.1 F (36.7 C)  SpO2: 96%   Filed Weights   06/27/20 1203  Weight: 171 lb 12.8 oz (77.9 kg)    LABORATORY DATA:  I have reviewed the data as listed CMP Latest Ref Rng & Units 06/03/2020 05/30/2020 05/02/2020  Glucose 70 - 99 mg/dL 142(H) 82 94  BUN 8 - 23 mg/dL 24(H) 22 22  Creatinine 0.44 - 1.00 mg/dL 1.32(H) 1.05(H) 1.09(H)  Sodium 135 - 145 mmol/L 138 138 139  Potassium 3.5 - 5.1 mmol/L 4.1 4.6 4.4  Chloride 98 - 111 mmol/L 105 106 109  CO2 22 - 32 mmol/L 21(L) 27 25  Calcium 8.9 - 10.3  mg/dL 9.3 9.4 9.1  Total Protein 6.5 - 8.1 g/dL 7.0 7.2 7.0  Total Bilirubin 0.3 - 1.2 mg/dL 0.6 0.5 0.3  Alkaline Phos 38 - 126 U/L 58 72 69  AST 15 - 41 U/L 27 22 20   ALT 0 - 44 U/L 19 19 13     Lab Results  Component Value Date   WBC 4.3 06/27/2020   HGB 10.3 (L) 06/27/2020   HCT 32.4 (L) 06/27/2020   MCV 86.9 06/27/2020   PLT 239 06/27/2020   NEUTROABS 2.5 06/27/2020    ASSESSMENT & PLAN:  Anemia of chronic disease 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 ( anemia of chronic inflammation), patient has chronic polymyalgia rheumatica  Left shoulder arthroplasty:  12/17/2017  Lab review 10/12/2017: Hemoglobin 10.2, MCV 94, normal WBC and platelet counts, creatinine 1.02 04/13/2019:Hemoglobin 9 08/24/2019:Hemoglobin 10.2 02/08/2020: hemoglobin 10.2 03/08/20: Hemoglobin9.9 05/02/2020: Hemoglobin 10.2 06/03/20: Hb 11.2 (did not receive Retacrit) 06/27/2020: Hemoglobin 10.3 (receiving Retacrit injection)  Proceed with todays injection  Current treatment:Retacrit 40,000 unitscurrentlyreceiving itevery4 weeks.(Inj if Hb is less than 10.4)   RTC every4weeks for retacrit and every 8weeks for follow up with me    No orders of the defined types were placed in this encounter.  The patient has a good understanding of the overall plan. she agrees with it. she will call with any problems that may develop before the next visit here.  Total time spent: 20 mins including face to face time and time spent for planning, charting and coordination of care  Nicholas Lose, MD 06/27/2020  I, Cloyde Reams Dorshimer, am acting as scribe for Dr. Nicholas Lose.  I have reviewed the above documentation for accuracy and completeness, and I agree with the above.

## 2020-06-27 ENCOUNTER — Inpatient Hospital Stay: Payer: Medicare Other

## 2020-06-27 ENCOUNTER — Inpatient Hospital Stay: Payer: Medicare Other | Attending: Hematology and Oncology

## 2020-06-27 ENCOUNTER — Inpatient Hospital Stay (HOSPITAL_BASED_OUTPATIENT_CLINIC_OR_DEPARTMENT_OTHER): Payer: Medicare Other | Admitting: Hematology and Oncology

## 2020-06-27 ENCOUNTER — Other Ambulatory Visit: Payer: Self-pay

## 2020-06-27 DIAGNOSIS — Z79899 Other long term (current) drug therapy: Secondary | ICD-10-CM | POA: Insufficient documentation

## 2020-06-27 DIAGNOSIS — D638 Anemia in other chronic diseases classified elsewhere: Secondary | ICD-10-CM

## 2020-06-27 DIAGNOSIS — D509 Iron deficiency anemia, unspecified: Secondary | ICD-10-CM | POA: Insufficient documentation

## 2020-06-27 DIAGNOSIS — M353 Polymyalgia rheumatica: Secondary | ICD-10-CM | POA: Insufficient documentation

## 2020-06-27 LAB — CBC WITH DIFFERENTIAL (CANCER CENTER ONLY)
Abs Immature Granulocytes: 0.02 10*3/uL (ref 0.00–0.07)
Basophils Absolute: 0 10*3/uL (ref 0.0–0.1)
Basophils Relative: 1 %
Eosinophils Absolute: 0.1 10*3/uL (ref 0.0–0.5)
Eosinophils Relative: 3 %
HCT: 32.4 % — ABNORMAL LOW (ref 36.0–46.0)
Hemoglobin: 10.3 g/dL — ABNORMAL LOW (ref 12.0–15.0)
Immature Granulocytes: 1 %
Lymphocytes Relative: 27 %
Lymphs Abs: 1.1 10*3/uL (ref 0.7–4.0)
MCH: 27.6 pg (ref 26.0–34.0)
MCHC: 31.8 g/dL (ref 30.0–36.0)
MCV: 86.9 fL (ref 80.0–100.0)
Monocytes Absolute: 0.5 10*3/uL (ref 0.1–1.0)
Monocytes Relative: 11 %
Neutro Abs: 2.5 10*3/uL (ref 1.7–7.7)
Neutrophils Relative %: 57 %
Platelet Count: 239 10*3/uL (ref 150–400)
RBC: 3.73 MIL/uL — ABNORMAL LOW (ref 3.87–5.11)
RDW: 14 % (ref 11.5–15.5)
WBC Count: 4.3 10*3/uL (ref 4.0–10.5)
nRBC: 0 % (ref 0.0–0.2)

## 2020-06-27 LAB — CMP (CANCER CENTER ONLY)
ALT: 13 U/L (ref 0–44)
AST: 20 U/L (ref 15–41)
Albumin: 3.4 g/dL — ABNORMAL LOW (ref 3.5–5.0)
Alkaline Phosphatase: 69 U/L (ref 38–126)
Anion gap: 9 (ref 5–15)
BUN: 16 mg/dL (ref 8–23)
CO2: 23 mmol/L (ref 22–32)
Calcium: 8.9 mg/dL (ref 8.9–10.3)
Chloride: 106 mmol/L (ref 98–111)
Creatinine: 1.25 mg/dL — ABNORMAL HIGH (ref 0.44–1.00)
GFR, Estimated: 44 mL/min — ABNORMAL LOW (ref >60)
Glucose, Bld: 130 mg/dL — ABNORMAL HIGH (ref 70–99)
Potassium: 3.9 mmol/L (ref 3.5–5.1)
Sodium: 138 mmol/L (ref 135–145)
Total Bilirubin: 0.5 mg/dL (ref 0.3–1.2)
Total Protein: 6.9 g/dL (ref 6.5–8.1)

## 2020-06-27 MED ORDER — EPOETIN ALFA-EPBX 40000 UNIT/ML IJ SOLN
40000.0000 [IU] | Freq: Once | INTRAMUSCULAR | Status: AC
  Administered 2020-06-27: 40000 [IU] via SUBCUTANEOUS

## 2020-06-27 MED ORDER — EPOETIN ALFA-EPBX 40000 UNIT/ML IJ SOLN
INTRAMUSCULAR | Status: AC
  Filled 2020-06-27: qty 1

## 2020-06-27 NOTE — Assessment & Plan Note (Signed)
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  Lab review 10/12/2017: Hemoglobin 10.2, MCV 94, normal WBC and platelet counts, creatinine 1.02 04/13/2019:Hemoglobin 9 08/24/2019:Hemoglobin 10.2 02/08/2020: hemoglobin 10.2 03/08/20: Hemoglobin9.9 05/02/2020: Hemoglobin 10.2 06/03/20: Hb 11.2  Proceed with todays injection  Current treatment:Retacrit 40,000 unitscurrentlyreceiving itevery4 weeks.(Inj if Hb is less than 10.4)  RTC every4weeks for retacrit and every 8weeks for follow up with me

## 2020-06-27 NOTE — Patient Instructions (Signed)

## 2020-07-01 ENCOUNTER — Telehealth: Payer: Self-pay | Admitting: Hematology and Oncology

## 2020-07-01 NOTE — Telephone Encounter (Signed)
Scheduled per 11/4 los. Pt will receive an updated appt calendar per next appt notes 

## 2020-07-22 ENCOUNTER — Other Ambulatory Visit: Payer: Self-pay

## 2020-07-22 ENCOUNTER — Telehealth (INDEPENDENT_AMBULATORY_CARE_PROVIDER_SITE_OTHER): Payer: Medicare Other | Admitting: Family Medicine

## 2020-07-22 VITALS — BP 162/89 | HR 69 | Temp 97.6°F | Ht 63.0 in | Wt 171.0 lb

## 2020-07-22 DIAGNOSIS — R059 Cough, unspecified: Secondary | ICD-10-CM

## 2020-07-22 MED ORDER — FLUTICASONE PROPIONATE 50 MCG/ACT NA SUSP: 2.0000 | Freq: Every day | NASAL | 1 refills | Status: DC

## 2020-07-22 MED ORDER — ALBUTEROL SULFATE HFA 108 (90 BASE) MCG/ACT IN AERS: 1.0000 | INHALATION_SPRAY | Freq: Four times a day (QID) | RESPIRATORY_TRACT | 1 refills | Status: DC | PRN

## 2020-07-22 NOTE — Progress Notes (Signed)
Interactive audio and video telecommunications were attempted between this provider and patient, however failed, due to patient having technical difficulties OR patient did not have access to video capability.  We continued and completed visit with audio only.   Virtual Visit via Telephone Note  I connected with patient on 07/22/20  at 11:54 AM  by telephone and verified that I am speaking with the correct person using two identifiers.  Location of patient: home.    Location of MD: [facility] Name of referring provider (if blank then none associated): Names per persons and role in encounter:  MD: Earlyne Iba, Patient: name listed above.    I discussed the limitations, risks, security and privacy concerns of performing an evaluation and management service by telephone and the availability of in person appointments. I also discussed with the patient that there may be a patient responsible charge related to this service. The patient expressed understanding and agreed to proceed.  CC: cough  History of Present Illness:   She hasn't had AM BP meds today.    Cough intermittently noted for the last 2 weeks.  She has h/o spring and fall allergies.  Cough comes in fits.  Some relief with OTC alka seltzer meds.  No fevers.  No sputum.  Some occ wheeze.  No ear pain, no ST.  She can still taste and smell.  She had covid vaccine.  She is still on zyrtec at baseline.  No known sick exposure.  No h/o SABA use.  Some rhinorrhea, clear.    Observations/Objective: nad Speech wnl  Assessment and Plan: Cough, which could be related to seasonal allergies.  We talked about options.  Reasonable to add on Flonase and albuterol with routine cautions and then update [name] if that is not improving.  She agrees to plan.  Follow Up Instructions: see above.    I discussed the assessment and treatment plan with the patient. The patient was provided an opportunity to ask questions and all were answered. The  patient agreed with the plan and demonstrated an understanding of the instructions.   The patient was advised to call back or seek an in-person evaluation if the symptoms worsen or if the condition fails to improve as anticipated.  I provided 19 minutes of non-face-to-face time during this encounter.  Elsie Stain, MD

## 2020-07-22 NOTE — Telephone Encounter (Signed)
Pt lvm at triage.  C/o cough and requests something be sent in.  She is going out of town [place], 08/26/19 for the weekend.  Also, pt is requesting refill on nystatin powder for irritation in fold of skin.  Says Dr. Glori Bickers last prescribed it about 1 yr ago.   Spoke with pt asking about sxs.  States she had the cough about 2 wks and occasionally has "coughing fits".  Denies any other sxs.  Tried OTC cough and congestion meds, barely helpful.  Concerned since she is going out of town for the weekend.  Offered 2:30 video visit with Dr. Glori Bickers but pt already has another appt.  Scheduled with Dr. Damita Dunnings at 11:00.  FYI to Dr. Glori Bickers and [place].

## 2020-07-22 NOTE — Addendum Note (Signed)
Addended by: [name] A on: 07/22/2020 11:19 AM   Modules accepted: Orders

## 2020-07-22 NOTE — Telephone Encounter (Signed)
I pended px for nystatin but did not know which pharmacy   Proceed with visit with Dr Damita Dunnings

## 2020-07-23 MED ORDER — NYSTATIN 100000 UNIT/GM EX POWD
Freq: Two times a day (BID) | CUTANEOUS | 1 refills | Status: DC | PRN
Start: 2020-07-23 — End: 2023-12-02

## 2020-07-23 NOTE — Addendum Note (Signed)
Addended by: Tammi Sou on: 07/23/2020 04:40 PM   Modules accepted: Orders

## 2020-07-24 DIAGNOSIS — R059 Cough, unspecified: Secondary | ICD-10-CM | POA: Insufficient documentation

## 2020-07-24 NOTE — Assessment & Plan Note (Signed)
Cough, which could be related to seasonal allergies.  We talked about options.  Reasonable to add on Flonase and albuterol with routine cautions and then update [name] if that is not improving.  She agrees to plan.

## 2020-07-25 ENCOUNTER — Ambulatory Visit: Payer: Medicare Other

## 2020-07-25 ENCOUNTER — Other Ambulatory Visit: Payer: Medicare Other

## 2020-07-25 ENCOUNTER — Other Ambulatory Visit: Payer: Self-pay | Admitting: Family Medicine

## 2020-07-26 NOTE — Telephone Encounter (Signed)
CPE is scheduled for 11/05/20, last filled on 06/07/20 #30 tabs with 1 refills

## 2020-08-13 ENCOUNTER — Other Ambulatory Visit: Payer: Self-pay | Admitting: Family Medicine

## 2020-08-22 ENCOUNTER — Inpatient Hospital Stay: Payer: Medicare Other

## 2020-08-22 ENCOUNTER — Other Ambulatory Visit: Payer: Self-pay

## 2020-08-22 ENCOUNTER — Inpatient Hospital Stay: Payer: Medicare Other | Attending: Hematology and Oncology

## 2020-08-22 VITALS — BP 138/82 | HR 57

## 2020-08-22 DIAGNOSIS — D638 Anemia in other chronic diseases classified elsewhere: Secondary | ICD-10-CM

## 2020-08-22 DIAGNOSIS — M353 Polymyalgia rheumatica: Secondary | ICD-10-CM | POA: Diagnosis not present

## 2020-08-22 DIAGNOSIS — Z79899 Other long term (current) drug therapy: Secondary | ICD-10-CM | POA: Diagnosis not present

## 2020-08-22 LAB — CMP (CANCER CENTER ONLY)
ALT: 14 U/L (ref 0–44)
AST: 18 U/L (ref 15–41)
Albumin: 3.3 g/dL — ABNORMAL LOW (ref 3.5–5.0)
Alkaline Phosphatase: 62 U/L (ref 38–126)
Anion gap: 4 — ABNORMAL LOW (ref 5–15)
BUN: 18 mg/dL (ref 8–23)
CO2: 29 mmol/L (ref 22–32)
Calcium: 9.1 mg/dL (ref 8.9–10.3)
Chloride: 108 mmol/L (ref 98–111)
Creatinine: 1.19 mg/dL — ABNORMAL HIGH (ref 0.44–1.00)
GFR, Estimated: 46 mL/min — ABNORMAL LOW (ref >60)
Glucose, Bld: 90 mg/dL (ref 70–99)
Potassium: 4.6 mmol/L (ref 3.5–5.1)
Sodium: 141 mmol/L (ref 135–145)
Total Bilirubin: 0.4 mg/dL (ref 0.3–1.2)
Total Protein: 6.7 g/dL (ref 6.5–8.1)

## 2020-08-22 LAB — CBC WITH DIFFERENTIAL (CANCER CENTER ONLY)
Abs Immature Granulocytes: 0.03 10*3/uL (ref 0.00–0.07)
Basophils Absolute: 0 10*3/uL (ref 0.0–0.1)
Basophils Relative: 1 %
Eosinophils Absolute: 0.2 10*3/uL (ref 0.0–0.5)
Eosinophils Relative: 3 %
HCT: 32.4 % — ABNORMAL LOW (ref 36.0–46.0)
Hemoglobin: 10 g/dL — ABNORMAL LOW (ref 12.0–15.0)
Immature Granulocytes: 1 %
Lymphocytes Relative: 32 %
Lymphs Abs: 1.9 10*3/uL (ref 0.7–4.0)
MCH: 27.9 pg (ref 26.0–34.0)
MCHC: 30.9 g/dL (ref 30.0–36.0)
MCV: 90.3 fL (ref 80.0–100.0)
Monocytes Absolute: 0.5 10*3/uL (ref 0.1–1.0)
Monocytes Relative: 8 %
Neutro Abs: 3.3 10*3/uL (ref 1.7–7.7)
Neutrophils Relative %: 55 %
Platelet Count: 249 10*3/uL (ref 150–400)
RBC: 3.59 MIL/uL — ABNORMAL LOW (ref 3.87–5.11)
RDW: 13.3 % (ref 11.5–15.5)
WBC Count: 5.9 10*3/uL (ref 4.0–10.5)
nRBC: 0 % (ref 0.0–0.2)

## 2020-08-22 MED ORDER — EPOETIN ALFA-EPBX 40000 UNIT/ML IJ SOLN
INTRAMUSCULAR | Status: AC
  Filled 2020-08-22: qty 1

## 2020-08-22 MED ORDER — EPOETIN ALFA-EPBX 40000 UNIT/ML IJ SOLN
40000.0000 [IU] | Freq: Once | INTRAMUSCULAR | Status: AC
  Administered 2020-08-22: 14:00:00 40000 [IU] via SUBCUTANEOUS

## 2020-08-22 NOTE — Patient Instructions (Signed)

## 2020-09-10 ENCOUNTER — Other Ambulatory Visit: Payer: Self-pay | Admitting: Family Medicine

## 2020-09-16 ENCOUNTER — Other Ambulatory Visit: Payer: Self-pay

## 2020-09-16 ENCOUNTER — Ambulatory Visit: Payer: Medicare Other | Admitting: Cardiology

## 2020-09-16 ENCOUNTER — Encounter: Payer: Self-pay | Admitting: Cardiology

## 2020-09-16 VITALS — BP 149/66 | HR 67 | Ht 63.0 in | Wt 175.4 lb

## 2020-09-16 DIAGNOSIS — I2510 Atherosclerotic heart disease of native coronary artery without angina pectoris: Secondary | ICD-10-CM | POA: Diagnosis not present

## 2020-09-16 DIAGNOSIS — G459 Transient cerebral ischemic attack, unspecified: Secondary | ICD-10-CM | POA: Diagnosis not present

## 2020-09-16 DIAGNOSIS — I10 Essential (primary) hypertension: Secondary | ICD-10-CM | POA: Diagnosis not present

## 2020-09-16 DIAGNOSIS — I351 Nonrheumatic aortic (valve) insufficiency: Secondary | ICD-10-CM

## 2020-09-16 DIAGNOSIS — R0609 Other forms of dyspnea: Secondary | ICD-10-CM

## 2020-09-16 DIAGNOSIS — Z9861 Coronary angioplasty status: Secondary | ICD-10-CM

## 2020-09-16 DIAGNOSIS — R0600 Dyspnea, unspecified: Secondary | ICD-10-CM

## 2020-09-16 DIAGNOSIS — E785 Hyperlipidemia, unspecified: Secondary | ICD-10-CM | POA: Diagnosis not present

## 2020-09-16 DIAGNOSIS — D638 Anemia in other chronic diseases classified elsewhere: Secondary | ICD-10-CM

## 2020-09-16 NOTE — Patient Instructions (Signed)
Medication Instructions:   No changes   *If you need a refill on your cardiac medications before your next appointment, please call your pharmacy*   Lab Work:  Not needed   Testing/Procedures:  Not needed  Follow-Up: At [facility], you and your health needs are our priority.  As part of our continuing mission to provide you with exceptional heart care, we have created designated Provider Care Teams.  These Care Teams include your primary Cardiologist (physician) and Advanced Practice Providers (APPs -  Physician Assistants and Nurse Practitioners) who all work together to provide you with the care you need, when you need it.     Your next appointment:   7 month(s) Aug 2022  The format for your next appointment:   In Person  Provider:   Glenetta Hew, MD   Other Instructions

## 2020-09-16 NOTE — Progress Notes (Signed)
Primary Care Provider: Abner Greenspan, MD Cardiologist: Glenetta Hew, MD Electrophysiologist: None  Clinic Note: Chief Complaint  Patient presents with  . Follow-up    30-month/hospital  . Coronary Artery Disease    No angina  . Transient Ischemic Attack    Back in October-no recurrent events  . Anemia    Improving, intermittent effusions.  She likes this much better than by mouth supplementation   ===================================  ASSESSMENT/PLAN   Problem List Items Addressed This Visit    Dyspnea on exertion (Chronic)    Stable.  The more active she is with exercise, the better she gets.  CPX showed significant deconditioning in 2020.  After the last year with her fall and then TIA, she has not been all that active.  Now trying to do much better.      CAD S/P percutaneous coronary angioplasty (Chronic)    She is now 4 years out from her PCI.  She has severely diseased LCx treated with DES PCI.  Moderate LAD disease treated medically with no recurrent angina. Her exertional dyspnea has been stable and probably more related to anemia as well as deconditioning.  Overall doing fairly well from a general medical standpoint.  No medicine changes made today. Fasting lipid panel and chemistries will be followed up by PCP likely in March timeframe.  Will await the results.  Plan:  Continue low-dose beta-blocker and ARB along with statin.  No further angina having discontinued Imdur.  Continue monotherapy with clopidogrel-  Was on DAPT for short interval following TIA, now back on monotherapy with clopidogrel.  okay to hold clopidogrel 5 days preop for surgery procedures.      Hyperlipidemia with target low density lipoprotein (LDL) cholesterol less than 70 mg/dL (Chronic)    She is due to have labs checked by PCP soon.  Labs as of March were pretty well controlled on modest dose of atorvastatin.  Tolerating relatively well.  She is now much more cognizant of her diet  and trying to be more active with exercise. Goal LDL remains 70 with [place] goal of less than 55.      Essential hypertension (Chronic)    Blood pressure is better controlled at PCPs office.  Little bit high today, but she had to rush to get in to the clinic.  She says that her pressures are usually better controlled than this.  She continues to be on metoprolol 25 mg twice daily and losartan 25 mg daily.  HCTZ was stopped due to hyponatremia in the past.  I just asked that she monitor her blood pressures at home and keep a log.  She can discuss when she sees Dr. Glori Bickers in follow-up. Would probably err on the side of mild permissive hypertension as opposed to treating for systolic pressures in the 120s .      Anemia of chronic disease (Chronic)    Most recent hemoglobin was 10.  Being followed by PCP and heme-onc.  Intermittent iron/EPO infusions.      TIA (transient ischemic attack) - Primary      ===================================  HPI:    [name] [name] is a 80 y.o. female with a PMH notable for CAD-DES PCI mid LCx (January 2010), HTN, HLD, baseline DOE along with GAD who presents today for follow-up that is partially post hospital follow-up and early annual follow-up.   Evaluated December 2017-January 2018 for exertional dyspnea:  Echo showed EF 45 to 50% with septal and inferoseptal HK with moderate AI.  Stress test-intermediate risk with EF 30 to 44% with small inferoapical and septal infarct without ischemia.  Cath January 2018 for persistent symptoms (reviewed below): 90% LCx (DES PCI), moderate LAD disease at small Diagonal (with ostial 70 %, poor PCI target)-LAD FFR negative.  CPX January 2020:-2 debilitated.  Suggested deconditioning.  Recommended PT OT referral.  [name] [name] was last seen on November 16, 2019 for delayed follow-up (after having a telehealth visit in June 2020).  She was doing better.  Exertional dyspnea had improved.  A lot of the dyspnea was  thought to be related to anemia.  Thought to have bone marrow suppression from methotrexate-receiving EPO.  Arthritis with joint okay, but limiting her activity.  Difficult to get moving.  Much stiffer.  No angina symptoms.  No CHF symptoms.  Stable DOE.  Minimal edema.  She had had her 2 vaccines (Moderna). => Noted that she still had fatigue, energy level improving.  She noted fatigue more limited to arthritis.  Baseline exertional dyspnea.  Legs relatively weak with unsteady gait.  Recent Hospitalizations:   5 /12-17/2021: Admitted after a mechanical fall-adjusting her blinds.  She fell and injured her right knee.  Severe pain with minimal movement.  Notable swelling. -->  X-ray films showed distal femur fracture that is a spiral fracture. ->  Seen by Dr. Onnie Graham ([facility]) => ORIF (Dr. Lyla Glassing) -> discharged to SNF, followed by home health-living with daughter and son-in-law..  Postop anemia down to 6.8-1 unit PRBC-discharge Hgb 8.3.  She had EPO infusion on 5/24.  (Plan for treatment every 3 to 4 weeks based on anemia)  Noted to have hyponatremia, HCTZ discontinued  ER visit 06/03/2020 with TIA => initial presentation was to [place] she noted an episode at roughly 1:15 PM while riding a car.  She started a mild headache with numbness in the right arm and slurred speech.  Last a couple minutes and was witnessed.  Symptoms resolved, still had a right eye droop (she initially did not want to get evaluated on that day (Sunday).  .  Felt lightheaded but no other significant neurologic findings. Call PCP and was told to go to urgent care=> Felt patient had a potential CVA-was sent to ER with family.  Admitted to Hospitalist Service.  In ER, CT negative for acute changes.  (TIA diagnosis preferred over CVA) -> MRI ordered by Neurology.  MRI brain and MRA head showed no acute intracranial normalities.  No major intracranial arterial occlusions or proximal stenosis.  Carotid  Dopplers with no suggested stenosis, and normal echo with no embolic source. => Thought to be possibly related to small vessel origin.  Recommendation was DAPT x3 weeks and then Plavix alone along with statin.;  Placed on propranolol for GERD and history of possible GI bleed.  Reviewed  CV studies:    The following studies were reviewed today: (if available, images/films reviewed: From Epic Chart or Care Everywhere) . Echo:06/04/2020: Normal LV size and function.  No or WMA.  EF 60 to 65%.  GR 1 DD with elevated LAP.  Normal RV size and function.  Normal RVP, RAP.  Mild aortic valve calcification-sclerosis but no stenosis.  Trivial AI.  No embolic source . Carotid Dopplers 06/04/2020: 1-39% bilateral ICA.  Bilateral vertebral arteries with antegrade flow.  Interval History:   OVEDA DADAMO returns here today stating that things are for the most part doing pretty well.  She is not had any further TIA symptoms.  No real change to exertional dyspnea.  As her anemia improves this is improving.  She says her appetite is improving and she is eating well-and ensuring that she is eating healthier since her TIA.Marland Kitchen  She is staying active.  She is still getting infusions, but is excited at the fact that she has not had to have his frequent infusions because her blood counts are improving.  She had been getting them every 3 weeks and is now getting them just about every 8-week.  She really denies any active cardiac symptoms of chest tightness or dyspnea with rest, stable exertional dyspnea but no chest pain.  No arrhythmia or heart failure symptoms.  No syncope or near syncope symptoms and no further TIA/amaurosis fugax symptoms.  She is somewhat emotionally down because this month is somewhat emotional for her.  Her 2-year anniversary of her husband's death was on the Dec 07, 2022, and the 22nd was the 50th anniversary of the death of their son who was killed at age 50.  She always is somewhat down at this time of  the year, but is otherwise doing okay physically.  The patient does not have symptoms concerning for COVID-19 infection (fever, chills, cough, or new shortness of breath).   REVIEWED OF SYSTEMS   Review of Systems  Constitutional: Positive for malaise/fatigue (Her energy level is definitely improving as her anemia improves.  She still have stiffness from her arthritis pains, and this limits her activity level.  But she is trying to stay as active as she can.). Negative for weight loss.  HENT: Negative for congestion and nosebleeds.   Respiratory: Positive for shortness of breath (Stable exertional dyspnea). Negative for cough.   Cardiovascular: Negative for leg swelling.  Gastrointestinal: Positive for constipation (Off and on, but not significant). Negative for blood in stool and melena.       Has a good healthy appetite.  Eating well and eating healthy.  Her daughter and son-in-law are making sure that she is eating smart foods and healthy.  Also making sure that she stays active.  Genitourinary: Positive for frequency. Negative for dysuria, hematuria and urgency.  Musculoskeletal: Positive for back pain and joint pain. Negative for falls.       Chronic pain from arthritis-shoulders, hips and knees as well as hands from psoriatic arthritis.--Trying to stay active because the more she does the better he feels.  Neurological: Negative for dizziness (When her counts are down, she may get some dizziness), speech change, focal weakness and weakness (Her leg weakness is improving, she is using a walker.).  Psychiatric/Behavioral: The patient is not nervous/anxious and does not have insomnia.        She talked about January being a somewhat emotional month for her with the December 07, 2022 and 22nd being anniversaries of the death of her husband 2 years ago, and then the death of her son at 76 years old 46 years ago.   I have reviewed and (if needed) personally updated the patient's problem list, medications,  allergies, past medical and surgical history, social and family history.   PAST MEDICAL HISTORY   Past Medical History:  Diagnosis Date  . Anemia    of chronic disease  . Anxiety   . Arthritis    "psoratic arthritis; new dx" (09/11/2016)  . CAD S/P percutaneous coronary angioplasty 08/2016   mLCx 90% -> 0% (2.75 mm x 16 mm) Synergy DES PCI Ostial D1 70% (not PTCA target). Moderate ostial and mD2.  LAD tandem 50% stenoses  w/ negative FFR (0.86)  . Cancer ([place])    skin cancer on leg  . Chronic lower back pain   . Complication of anesthesia 1980 and 1982   trouble waking up after breast biopsy, many surgeries since no problem  . Depression   . Dyspnea on effort - CHRONIC    partial relief of Sx post PCI of L Circumflex.  . Fibrocystic breast   . GERD (gastroesophageal reflux disease)   . Headache    "monthly" (09/11/2016)  . History of blood transfusion    "several since age 45; I've had them after most of my surgeries" (09/11/2016)  . History of myocardial infarction     - Myoview [name] 2018 indicated small Inferior - Inferoseptal Infarct (vs. rest ischemia). Echo with inferoseptal hypokinesis.  Marland Kitchen Hx of skin cancer, basal cell 2013 and 2012   right inner, lower leg; several scattered around my body  . Hypertension   . Iron deficiency anemia    "I've had an iron infusion"  . Osteoarthritis   . Osteopenia   . Peptic ulcer disease    H pylori  . PMR (polymyalgia rheumatica) (HCC)   . PONV (postoperative nausea and vomiting)   . Psoriasis   . TIA (transient ischemic attack) 06/03/2020   Symptoms of right arm weakness/numbness and slurred speech as well as right droopm => MRI brain/MRA head normal.  Normal echo normal carotid Dopplers.    PAST SURGICAL HISTORY   Past Surgical History:  Procedure Laterality Date  . APPENDECTOMY    . BACK SURGERY     x 6   . BREAST BIOPSY Left 1980 and 1982   "benign"  . CARDIAC CATHETERIZATION N/A 09/11/2016   Procedure: Left Heart Cath and  Coronary Angiography;  Surgeon: Leonie Man, MD;  Location: [facility];  Service: Cardiovascular: mLCx 90% * (PCI). Ostial D1 70% (not PTCA target). Mod ostial and mD2.  LAD tandem 50% stenoses w/ negative FFR (0.86)  . CARDIAC CATHETERIZATION N/A 09/11/2016   Procedure: Intravascular Pressure Wire/FFR Study;  Surgeon: Leonie Man, MD;  Location: [facility];  Service: Cardiovascular: --  LAD tandem 50% stenoses w/ negative FFR (0.86  . CARDIAC CATHETERIZATION N/A 09/11/2016   Procedure: Coronary Stent Intervention;  Surgeon: Leonie Man, MD;  Location: [facility];  Service: Cardiovascular: -- DES PCI mLCx 90%--0% SYNERGY DES 2.75 MM X 16 MM.  Marland Kitchen CARDIAC CATHETERIZATION  2001   non-obstructive CAD  . CATARACT EXTRACTION W/ INTRAOCULAR LENS  IMPLANT, BILATERAL  July -  August 2017  . DILATION AND CURETTAGE OF UTERUS  X 3  . ESOPHAGOGASTRODUODENOSCOPY  03/2009   ulcer,HP, GERD stricture  . EXCISIONAL HEMORRHOIDECTOMY    . HIP ARTHROPLASTY Left 2010  . JOINT REPLACEMENT    . KNEE ARTHROSCOPY Bilateral   . LAPAROSCOPIC CHOLECYSTECTOMY  2005  . LUMBAR DISC SURGERY  11/1984; 1987; 1989; 2004; 2005 X 2   deg. disk lumber spine   . MOHS SURGERY Right    "inside my lower leg"  . NASAL SINUS SURGERY  1970s  . NM MYOVIEW LTD  08/28/2016   INTERMEDIATE RISK (b/c reduced EF & ? small infarct) -- EF ~35-40%.  ? Prior Small Inferior-apical & septal infarct (w/o ischemia).  Difuse HK - worse in inferoapex.  Suggest prior infarct with no ischemia. Ischemic area correlates with echo WMA  . ORIF FEMUR FRACTURE Right 01/04/2020   Procedure: OPEN REDUCTION INTERNAL FIXATION (ORIF) DISTAL FEMUR  FRACTURE;  Surgeon: Rod Can, MD;  Location: [facility];  Service: Orthopedics;  Laterality: Right;  . REVERSE SHOULDER ARTHROPLASTY Left 12/17/2017   Procedure: LEFT REVERSE SHOULDER ARTHROPLASTY;  Surgeon: Netta Cedars, MD;  Location: [facility];  Service: Orthopedics;  Laterality: Left;  .  TOTAL HIP ARTHROPLASTY Right 04/04/2013   Procedure: RIGHT TOTAL HIP ARTHROPLASTY ANTERIOR APPROACH;  Surgeon: Mauri Pole, MD;  Location: [facility];  Service: Orthopedics;  Laterality: Right;  . TOTAL KNEE ARTHROPLASTY Right 2009  . TRANSTHORACIC ECHOCARDIOGRAM  1/'18; 1/'19   a) 1/'18: mild conc LVH. EF 45-50% - anteroseptal & inferoseptal HK. Mod AI.; b) 1/'19 - post PCI: (post PCI) --> EF normalized:  Normal LV systolic function; mild diastolic dysfunction; mild   LVH; sclerotic aortic valve with mild AI.  No RWMA  . TRANSTHORACIC ECHOCARDIOGRAM  06/04/2020    for TIA: Normal LV size and function.  No or WMA.  EF 60 to 65%.  GR 1 DD with elevated LAP.  Normal RV size and function.  Normal RVP, RAP.  Mild aortic valve calcification-sclerosis but no stenosis.  Trivial AI.  No embolic source  . TUBAL LIGATION    . VAGINAL HYSTERECTOMY  1970s   partial    PCI [name] 2018 -mid LCx 90% - 0% with Synergy DES 2.75 mm right 60 mm; LAD FFR negative.    Immunization History  Administered Date(s) Administered  . Fluad Quad(high Dose 65+) 06/30/2019, 05/06/2020  . Influenza,inj,Quad PF,6+ Mos 07/17/2013, 09/12/2016, 10/12/2017, 10/25/2018  . PFIZER(Purple Top)SARS-COV-2 Vaccination 09/14/2019, 10/05/2019  . Pneumococcal Conjugate-13 10/12/2017  . Pneumococcal Polysaccharide-23 01/26/2008, 04/16/2010  . Td 03/28/1998, 01/26/2008    MEDICATIONS/ALLERGIES   Current Meds  Medication Sig  . acetaminophen (TYLENOL) 500 MG tablet Take 1,000 mg by mouth 2 (two) times daily as needed for moderate pain or headache.   . albuterol (VENTOLIN HFA) 108 (90 Base) MCG/ACT inhaler Inhale 1 puff into the lungs every 6 (six) hours as needed (for cough).  Marland Kitchen atorvastatin (LIPITOR) 20 MG tablet TAKE 1 TABLET BY MOUTH EVERY DAY  . busPIRone (BUSPAR) 15 MG tablet Take 1 tablet (15 mg total) by mouth daily.  . cetirizine (ZYRTEC) 10 MG tablet TAKE 1 TABLET BY MOUTH EVERY DAY  . clopidogrel (PLAVIX) 75 MG tablet TAKE 1  TABLET BY MOUTH EVERY DAY WITH BREAKFAST  . HYDROcodone-acetaminophen (NORCO/VICODIN) 5-325 MG tablet Take 1 tablet by mouth 3 (three) times daily as needed for moderate pain.  Marland Kitchen losartan (COZAAR) 25 MG tablet Take 1 tablet (25 mg total) by mouth daily.  . Melatonin 3 MG CAPS Take 6 mg by mouth at bedtime.   . methocarbamol (ROBAXIN) 500 MG tablet TAKE 1 TABLET BY MOUTH EVERY 8 HOURS AS NEEDED FOR MUSCLE SPASMS.  . metoprolol tartrate (LOPRESSOR) 25 MG tablet TAKE 1 TABLET BY MOUTH TWICE A DAY  . nystatin (MYCOSTATIN/NYSTOP) powder Apply topically 2 (two) times daily as needed. To affected areas to prevent yeast (in skin folds)  . pantoprazole (PROTONIX) 40 MG tablet TAKE 1 TABLET BY MOUTH EVERY DAY  . sertraline (ZOLOFT) 25 MG tablet TAKE 1 TABLET BY MOUTH EVERY DAY  . Vitamin D, Cholecalciferol, 25 MCG (1000 UT) CAPS Take 2,000 Units by mouth in the morning and at bedtime.   . [DISCONTINUED] fluticasone (FLONASE) 50 MCG/ACT nasal spray SPRAY 2 SPRAYS INTO EACH NOSTRIL EVERY DAY    Allergies  Allergen Reactions  . 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  Ace Inhibitors     cough  . Hydrochlorothiazide     Hyponatremia   . Oxaprozin     GI upset   . Pregabalin Swelling    SWELLING REACTION UNSPECIFIED   . Amoxicillin-Pot Clavulanate Nausea And Vomiting    gi upset Has patient had a PCN reaction causing immediate rash, facial/tongue/throat swelling, SOB or lightheadedness with hypotension: No Has patient had a PCN reaction causing severe rash involving mucus membranes or skin necrosis: No Has patient had a PCN reaction that required hospitalization No Has patient had a PCN reaction occurring within the last 10 years: No If all of the above answers are "NO", then may proceed with Cephalosporin use.     SOCIAL HISTORY/FAMILY HISTORY   Reviewed in Epic:  Pertinent findings:  Social History   Tobacco Use  . Smoking status: Never Smoker  .  Smokeless tobacco: Never Used  Vaping Use  . Vaping Use: Never used  Substance Use Topics  . Alcohol use: Yes    Alcohol/week: 0.0 standard drinks    Comment: 09/11/2016 "might have a margarita q 3-4 months"  . Drug use: No   Social History   Social History Narrative   Now Widowed as of [name] 2020.   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    OBJCTIVE -PE, EKG, labs   Wt Readings from Last 3 Encounters:  09/16/20 175 lb 6.4 oz (79.6 kg)  07/22/20 171 lb (77.6 kg)  06/27/20 171 lb 12.8 oz (77.9 kg)    Physical Exam: BP (!) 149/66   Pulse 67   Ht 5\' 3"  (1.6 m)   Wt 175 lb 6.4 oz (79.6 kg)   SpO2 100%   BMI 31.07 kg/m   -She indicates that her systolic blood pressures at home are usually in the 120 to 130 mmHg range; at her PCPs office recently is 132/70 mmHg.Marland Kitchen  She thinks that they checked her blood pressure too quickly after walking in. Physical Exam Vitals reviewed.  Constitutional:      General: She is not in acute distress.    Appearance: Normal appearance. She is obese. She is not ill-appearing (Relatively healthy appearing.) or toxic-appearing.     Comments: Well-groomed.  Well-nourished.  Relatively healthy appearing.  HENT:     Head: Normocephalic and atraumatic.     Ears:     Comments: Hard of hearing    Nose: No congestion or rhinorrhea.  Neck:     Vascular: No carotid bruit, hepatojugular reflux or JVD.  Cardiovascular:     Rate and Rhythm: Normal rate and regular rhythm.  No extrasystoles are present.    Chest Wall: PMI is not displaced.     Pulses: Normal pulses and intact distal pulses.     Heart sounds: S1 normal and S2 normal. Heart sounds are distant. Murmur (Soft 1/6C-D SEM at RUSB.) heard.  No friction rub. No gallop.   Pulmonary:     Effort: Pulmonary effort is normal. No respiratory  distress.     Breath sounds: Normal breath sounds.  Chest:     Chest wall: No tenderness.  Musculoskeletal:        General: Swelling (Trivial bilateral ankle and calf edema.) and deformity (Right leg does have some swelling at the hip and knee)  present.     Cervical back: Normal range of motion and neck supple.  Neurological:     General: No focal deficit present.     Mental Status: She is alert and oriented to person, place, and time. Mental status is at baseline.     Gait: Gait abnormal (Slow, methodical gait).     Comments: Somewhat less sure of herself, and little bit confused.  Has trouble remembering all of the events of last year.  Psychiatric:        Behavior: Behavior normal.        Thought Content: Thought content normal.        Judgment: Judgment normal.     Comments: Somewhat somber, but otherwise doing okay     Adult ECG Report Not checked  Recent Labs:  reviewed  Lab Results  Component Value Date   CHOL 136 10/27/2019   HDL 44.70 10/27/2019   LDLCALC 55 10/27/2019   LDLDIRECT 35.0 10/19/2018   TRIG 182.0 (H) 10/27/2019   CHOLHDL 3 10/27/2019   Lab Results  Component Value Date   CREATININE 1.19 (H) 08/22/2020   BUN 18 08/22/2020   NA 141 08/22/2020   K 4.6 08/22/2020   CL 108 08/22/2020   CO2 29 08/22/2020   CBC Latest Ref Rng & Units 08/22/2020 06/27/2020 06/03/2020  WBC 4.0 - 10.5 K/uL 5.9 4.3 8.6  Hemoglobin 12.0 - 15.0 g/dL 10.0(L) 10.3(L) 11.2(L)  Hematocrit 36.0 - 46.0 % 32.4(L) 32.4(L) 36.8  Platelets 150 - 400 K/uL 249 239 289    Lab Results  Component Value Date   TSH 2.90 10/27/2019    ==================================================  COVID-19 Education: The signs and symptoms of COVID-19 were discussed with the patient and how to seek care for testing (follow up with PCP or arrange E-visit).   The importance of social distancing and COVID-19 vaccination was discussed today. The patient is practicing social distancing & Masking.   I  spent a total of 22 minutes with the patient spent in direct patient consultation.  Additional time spent with chart review  / charting (studies, outside notes, etc): 20 min -> 2 hospitalizations, clinic visits etc. reviewed. Total Time: 29min   Current medicines are reviewed at length with the patient today.  (+/- concerns) none  This visit occurred during the SARS-CoV-2 public health emergency.  Safety protocols were in place, including screening questions prior to the visit, additional usage of staff PPE, and extensive cleaning of exam room while observing appropriate contact time as indicated for disinfecting solutions.  Notice: This dictation was prepared with Dragon dictation along with smaller phrase technology. Any transcriptional errors that result from this process are unintentional and may not be corrected upon review.  Patient Instructions / Medication Changes & Studies & Tests Ordered   Patient Instructions  Medication Instructions:   No changes   *If you need a refill on your cardiac medications before your next appointment, please call your pharmacy*   Lab Work:  Not needed   Testing/Procedures:  Not needed  Follow-Up: At [facility], you and your health needs are our priority.  As part of our continuing mission to provide you with exceptional heart care, we have created designated Provider Care Teams.  These Care Teams include your primary Cardiologist (physician) and Advanced Practice Providers (APPs -  Physician Assistants and Nurse Practitioners) who all work together to provide you with the care you need, when you need it.     Your next appointment:  7 month(s) Aug 2022  The format for your next appointment:   In Person  Provider:   Glenetta Hew, MD   Other Instructions     Studies Ordered:   No orders of the defined types were placed in this encounter.    Glenetta Hew, M.D., M.S. Interventional Cardiologist   Pager #  [phone] Phone # [phone] [address] [place], [place] [address]   Thank you for choosing [facility] at [facility]!!

## 2020-09-17 ENCOUNTER — Other Ambulatory Visit: Payer: Self-pay | Admitting: Family Medicine

## 2020-09-19 ENCOUNTER — Ambulatory Visit: Payer: Medicare Other | Admitting: Hematology and Oncology

## 2020-09-19 ENCOUNTER — Ambulatory Visit: Payer: Medicare Other

## 2020-09-19 ENCOUNTER — Encounter: Payer: Self-pay | Admitting: Cardiology

## 2020-09-19 ENCOUNTER — Other Ambulatory Visit: Payer: Medicare Other

## 2020-09-19 NOTE — Assessment & Plan Note (Signed)
Blood pressure is better controlled at PCPs office.  Little bit high today, but she had to rush to get in to the clinic.  She says that her pressures are usually better controlled than this.  She continues to be on metoprolol 25 mg twice daily and losartan 25 mg daily.  HCTZ was stopped due to hyponatremia in the past.  I just asked that she monitor her blood pressures at home and keep a log.  She can discuss when she sees Dr. Glori Bickers in follow-up. Would probably err on the side of mild permissive hypertension as opposed to treating for systolic pressures in the 120s .

## 2020-09-19 NOTE — Assessment & Plan Note (Signed)
Stable.  The more active she is with exercise, the better she gets.  CPX showed significant deconditioning in 2020.  After the last year with her fall and then TIA, she has not been all that active.  Now trying to do much better.

## 2020-09-19 NOTE — Assessment & Plan Note (Addendum)
She is now 4 years out from her PCI.  She has severely diseased LCx treated with DES PCI.  Moderate LAD disease treated medically with no recurrent angina. Her exertional dyspnea has been stable and probably more related to anemia as well as deconditioning.  Overall doing fairly well from a general medical standpoint.  No medicine changes made today. Fasting lipid panel and chemistries will be followed up by PCP likely in March timeframe.  Will await the results.  Plan:  Continue low-dose beta-blocker and ARB along with statin.  No further angina having discontinued Imdur.  Continue monotherapy with clopidogrel-  Was on DAPT for short interval following TIA, now back on monotherapy with clopidogrel.  okay to hold clopidogrel 5 days preop for surgery procedures.

## 2020-09-19 NOTE — Assessment & Plan Note (Addendum)
Most recent hemoglobin was 10.  Being followed by PCP and heme-onc.  Intermittent iron/EPO infusions.

## 2020-09-19 NOTE — Assessment & Plan Note (Signed)
She is due to have labs checked by PCP soon.  Labs as of March were pretty well controlled on modest dose of atorvastatin.  Tolerating relatively well.  She is now much more cognizant of her diet and trying to be more active with exercise. Goal LDL remains 70 with [place] goal of less than 55.

## 2020-09-30 ENCOUNTER — Telehealth: Payer: Self-pay

## 2020-09-30 NOTE — Telephone Encounter (Signed)
   [facility] Pre-operative Risk Assessment    HEARTCARE STAFF: - Please ensure there is not already an duplicate clearance open for this procedure. - Under Visit Info/Reason for Call, type in Other and utilize the format Clearance MM/DD/YY or Clearance TBD. Do not use dashes or single digits. - If request is for dental extraction, please clarify the # of teeth to be extracted.  Request for surgical clearance:  1. What type of surgery is being performed? Right Carpal Tunnel Release   2. When is this surgery scheduled? 10/18/2020   3. What type of clearance is required (medical clearance vs. Pharmacy clearance to hold med vs. Both)? Medical  4. Are there any medications that need to be held prior to surgery and how long?N/A   5. Practice name and name of physician performing surgery? [facility] Dr. Roseanne Kaufman   6. What is the office phone number? [phone]   7.   What is the office fax number? [phone]  8.   Anesthesia type (None, local, MAC, general) ? Local Block   Monia Pouch 09/30/2020, 4:17 PM  _________________________________________________________________   (provider comments below)

## 2020-10-01 NOTE — Telephone Encounter (Signed)
Carpal tunnel surgery is low risk.  I think is fine to proceed with surgery.  Were not likely to be able to change her risk by doing a stress test.  Okay to hold Plavix.  Glenetta Hew, MD

## 2020-10-01 NOTE — Telephone Encounter (Signed)
   Primary Cardiologist: Glenetta Hew, MD  Chart reviewed as part of pre-operative protocol coverage. Patient was contacted 10/01/2020 in reference to pre-operative risk assessment for pending surgery as outlined below.  LIELLE VANDERVORT was last seen on 09/19/20 by Dr. Ellyn Hack.  Since that day, ONEISHA AMMONS has done well.  She denies exertional chest pain. She is still completing PT for broken femur from last year and can't walk without a walker.   Per Dr. Ellyn Hack, carpal tunnel surgery is low risk.  I think is fine to proceed with surgery.  Were not likely to be able to change her risk by doing a stress test. She may hold plavix for 5 days prior   Therefore, based on ACC/AHA guidelines, the patient would be at acceptable risk for the planned procedure without further cardiovascular testing.   The patient was advised that if she develops new symptoms prior to surgery to contact our office to arrange for a follow-up visit, and she verbalized understanding.  I will route this recommendation to the requesting party via Epic fax function and remove from pre-op pool. Please call with questions.  [place], PA 10/01/2020, 9:01 AM

## 2020-10-01 NOTE — Telephone Encounter (Signed)
Dr. 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 She broke her femur last year and still can't complete 4.0 METS. We are asked for clearance for carpal tunnel surgery using local block, no general anesthesia. Her history of CAD, mild ICM, and TIA places her at high risk for complications. Since she can't complete 4.0 METS, can you please weigh in on clearance for low risk surgery?  She will hold plavix for 5 days, per your office note.

## 2020-10-14 NOTE — Progress Notes (Signed)
Patient Care Team: Tower, [name], MD as PCP - General Ellyn Hack Leonie Green, MD as PCP - Cardiology (Cardiology) Suella Broad, MD as Consulting Physician (Physical Medicine and Rehabilitation) Gavin Pound, MD as Consulting Physician (Rheumatology) [place], Jennefer Bravo, MD as Referring Physician (Dermatology) Nicholas Lose, MD as Consulting Physician (Hematology and Oncology)  DIAGNOSIS:    ICD-10-CM   1. Acute respiratory failure with hypoxia (HCC)  J96.01     CHIEF COMPLIANT: Follow-up of anemia  INTERVAL HISTORY: [name] [name] is a 79 y.o. with above-mentioned history of anemia whocurrentlyreceives Retacrit.She presents to the clinic todayfor follow-upand Retacrit.  She has not had a Retacrit injection in 8 weeks.  She appears to be doing quite well.  Denies any signs or symptoms of worsening anemia.  She is complaining of joint stiffness in her fingers.  She is scheduled to undergo carpal tunnel surgery this week.  ALLERGIES:  is allergic to sulfonamide derivatives, ace inhibitors, hydrochlorothiazide, oxaprozin, pregabalin, and amoxicillin-pot clavulanate.  MEDICATIONS:  Current Outpatient Medications  Medication Sig Dispense Refill  . acetaminophen (TYLENOL) 500 MG tablet Take 1,000 mg by mouth 2 (two) times daily as needed for moderate pain or headache.     . albuterol (VENTOLIN HFA) 108 (90 Base) MCG/ACT inhaler Inhale 1 puff into the lungs every 6 (six) hours as needed (for cough). 8 g 1  . atorvastatin (LIPITOR) 20 MG tablet TAKE 1 TABLET BY MOUTH EVERY DAY 90 tablet 3  . busPIRone (BUSPAR) 15 MG tablet Take 1 tablet (15 mg total) by mouth daily. 90 tablet 3  . cetirizine (ZYRTEC) 10 MG tablet TAKE 1 TABLET BY MOUTH EVERY DAY 90 tablet 3  . clopidogrel (PLAVIX) 75 MG tablet TAKE 1 TABLET BY MOUTH EVERY DAY WITH BREAKFAST 90 tablet 3  . fluticasone (FLONASE) 50 MCG/ACT nasal spray SPRAY 2 SPRAYS INTO EACH NOSTRIL EVERY DAY 48 mL 1  . HYDROcodone-acetaminophen  (NORCO/VICODIN) 5-325 MG tablet Take 1 tablet by mouth 3 (three) times daily as needed for moderate pain. 30 tablet 0  . losartan (COZAAR) 25 MG tablet Take 1 tablet (25 mg total) by mouth daily. 90 tablet 3  . Melatonin 3 MG CAPS Take 6 mg by mouth at bedtime.     . methocarbamol (ROBAXIN) 500 MG tablet TAKE 1 TABLET BY MOUTH EVERY 8 HOURS AS NEEDED FOR MUSCLE SPASMS. 30 tablet 3  . metoprolol tartrate (LOPRESSOR) 25 MG tablet TAKE 1 TABLET BY MOUTH TWICE A DAY 180 tablet 2  . nystatin (MYCOSTATIN/NYSTOP) powder Apply topically 2 (two) times daily as needed. To affected areas to prevent yeast (in skin folds) 30 g 1  . pantoprazole (PROTONIX) 40 MG tablet TAKE 1 TABLET BY MOUTH EVERY DAY 90 tablet 1  . sertraline (ZOLOFT) 25 MG tablet TAKE 1 TABLET BY MOUTH EVERY DAY 90 tablet 1  . Vitamin D, Cholecalciferol, 25 MCG (1000 UT) CAPS Take 2,000 Units by mouth in the morning and at bedtime.      No current facility-administered medications for this visit.    PHYSICAL EXAMINATION: ECOG PERFORMANCE STATUS: 1 - Symptomatic but completely ambulatory  Vitals:   10/15/20 1418  BP: (!) 164/61  Pulse: 63  Resp: 17  Temp: 98.1 F (36.7 C)  SpO2: 100%   Filed Weights   10/15/20 1418  Weight: 176 lb 11.2 oz (80.2 kg)     LABORATORY DATA:  I have reviewed the data as listed CMP Latest Ref Rng & Units 10/15/2020 08/22/2020 06/27/2020  Glucose 70 -  99 mg/dL 82 90 130(H)  BUN 8 - 23 mg/dL 21 18 16   Creatinine 0.44 - 1.00 mg/dL 1.25(H) 1.19(H) 1.25(H)  Sodium 135 - 145 mmol/L 142 141 138  Potassium 3.5 - 5.1 mmol/L 4.4 4.6 3.9  Chloride 98 - 111 mmol/L 108 108 106  CO2 22 - 32 mmol/L 24 29 23   Calcium 8.9 - 10.3 mg/dL 8.7(L) 9.1 8.9  Total Protein 6.5 - 8.1 g/dL 7.1 6.7 6.9  Total Bilirubin 0.3 - 1.2 mg/dL 0.4 0.4 0.5  Alkaline Phos 38 - 126 U/L 66 62 69  AST 15 - 41 U/L 17 18 20   ALT 0 - 44 U/L 11 14 13     Lab Results  Component Value Date   WBC 5.3 10/15/2020   HGB 10.2 (L) 10/15/2020    HCT 32.6 (L) 10/15/2020   MCV 88.6 10/15/2020   PLT 205 10/15/2020   NEUTROABS 2.5 10/15/2020    ASSESSMENT & PLAN:  Acute respiratory failure with hypoxia (HCC) 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  Lab review 10/12/2017: Hemoglobin 10.2, MCV 94, normal WBC and platelet counts, creatinine 1.02 04/13/2019:Hemoglobin 9 08/24/2019:Hemoglobin 10.2 02/08/2020: hemoglobin 10.2 03/08/20: Hemoglobin9.9 05/02/2020: Hemoglobin 10.2 06/03/20: Hb 11.2 (did not receive Retacrit) 06/27/2020: Hemoglobin 10.3 (receiving Retacrit injection) 10/15/2020: Hemoglobin 10.2 (receiving Retacrit)  Proceed with todays injection  Current treatment:Retacrit 40,000 unitscurrentlyreceiving itevery8 weeks.(Inj if Hb is less than 10.4)   Patient will come every 2 months for labs and injection appointment. I will see her back in 6 months.  If her labs look great then we may consider switching her to every 29-month injections. She is contemplating on going back on methotrexate.  If that happens then she may be more anemic in the future.    No orders of the defined types were placed in this encounter.  The patient has a good understanding of the overall plan. she agrees with it. she will call with any problems that may develop before the next visit here.  Total time spent: 20 mins including face to face time and time spent for planning, charting and coordination of care  Rulon Eisenmenger, MD, MPH 10/15/2020  I, Cloyde Reams Dorshimer, am acting as scribe for Dr. Nicholas Lose.  I have reviewed the above documentation for accuracy and completeness, and I agree with the above.

## 2020-10-14 NOTE — Assessment & Plan Note (Signed)
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  Lab review 10/12/2017: Hemoglobin 10.2, MCV 94, normal WBC and platelet counts, creatinine 1.02 04/13/2019:Hemoglobin 9 08/24/2019:Hemoglobin 10.2 02/08/2020: hemoglobin 10.2 03/08/20: Hemoglobin9.9 05/02/2020: Hemoglobin 10.2 06/03/20: Hb 11.2 (did not receive Retacrit) 06/27/2020: Hemoglobin 10.3 (receiving Retacrit injection)  Proceed with todays injection  Current treatment:Retacrit 40,000 unitscurrentlyreceiving itevery4 weeks.(Inj if Hb is less than 10.4)   RTC every4weeks for retacrit and every 8weeks for follow up with me

## 2020-10-15 ENCOUNTER — Inpatient Hospital Stay: Payer: Medicare Other | Attending: Hematology and Oncology | Admitting: Hematology and Oncology

## 2020-10-15 ENCOUNTER — Inpatient Hospital Stay: Payer: Medicare Other

## 2020-10-15 ENCOUNTER — Other Ambulatory Visit: Payer: Self-pay

## 2020-10-15 DIAGNOSIS — D638 Anemia in other chronic diseases classified elsewhere: Secondary | ICD-10-CM | POA: Diagnosis not present

## 2020-10-15 DIAGNOSIS — M353 Polymyalgia rheumatica: Secondary | ICD-10-CM | POA: Insufficient documentation

## 2020-10-15 DIAGNOSIS — M2560 Stiffness of unspecified joint, not elsewhere classified: Secondary | ICD-10-CM | POA: Insufficient documentation

## 2020-10-15 DIAGNOSIS — J9601 Acute respiratory failure with hypoxia: Secondary | ICD-10-CM | POA: Diagnosis present

## 2020-10-15 DIAGNOSIS — Z79899 Other long term (current) drug therapy: Secondary | ICD-10-CM | POA: Insufficient documentation

## 2020-10-15 LAB — CBC WITH DIFFERENTIAL (CANCER CENTER ONLY)
Abs Immature Granulocytes: 0.03 10*3/uL (ref 0.00–0.07)
Basophils Absolute: 0 10*3/uL (ref 0.0–0.1)
Basophils Relative: 0 %
Eosinophils Absolute: 0.1 10*3/uL (ref 0.0–0.5)
Eosinophils Relative: 2 %
HCT: 32.6 % — ABNORMAL LOW (ref 36.0–46.0)
Hemoglobin: 10.2 g/dL — ABNORMAL LOW (ref 12.0–15.0)
Immature Granulocytes: 1 %
Lymphocytes Relative: 41 %
Lymphs Abs: 2.1 10*3/uL (ref 0.7–4.0)
MCH: 27.7 pg (ref 26.0–34.0)
MCHC: 31.3 g/dL (ref 30.0–36.0)
MCV: 88.6 fL (ref 80.0–100.0)
Monocytes Absolute: 0.5 10*3/uL (ref 0.1–1.0)
Monocytes Relative: 9 %
Neutro Abs: 2.5 10*3/uL (ref 1.7–7.7)
Neutrophils Relative %: 47 %
Platelet Count: 205 10*3/uL (ref 150–400)
RBC: 3.68 MIL/uL — ABNORMAL LOW (ref 3.87–5.11)
RDW: 13.4 % (ref 11.5–15.5)
WBC Count: 5.3 10*3/uL (ref 4.0–10.5)
nRBC: 0 % (ref 0.0–0.2)

## 2020-10-15 LAB — CMP (CANCER CENTER ONLY)
ALT: 11 U/L (ref 0–44)
AST: 17 U/L (ref 15–41)
Albumin: 3.7 g/dL (ref 3.5–5.0)
Alkaline Phosphatase: 66 U/L (ref 38–126)
Anion gap: 10 (ref 5–15)
BUN: 21 mg/dL (ref 8–23)
CO2: 24 mmol/L (ref 22–32)
Calcium: 8.7 mg/dL — ABNORMAL LOW (ref 8.9–10.3)
Chloride: 108 mmol/L (ref 98–111)
Creatinine: 1.25 mg/dL — ABNORMAL HIGH (ref 0.44–1.00)
GFR, Estimated: 43 mL/min — ABNORMAL LOW (ref >60)
Glucose, Bld: 82 mg/dL (ref 70–99)
Potassium: 4.4 mmol/L (ref 3.5–5.1)
Sodium: 142 mmol/L (ref 135–145)
Total Bilirubin: 0.4 mg/dL (ref 0.3–1.2)
Total Protein: 7.1 g/dL (ref 6.5–8.1)

## 2020-10-15 MED ORDER — EPOETIN ALFA-EPBX 40000 UNIT/ML IJ SOLN
INTRAMUSCULAR | Status: AC
  Filled 2020-10-15: qty 1

## 2020-10-15 MED ORDER — EPOETIN ALFA-EPBX 40000 UNIT/ML IJ SOLN
40000.0000 [IU] | Freq: Once | INTRAMUSCULAR | Status: AC
  Administered 2020-10-15: 40000 [IU] via SUBCUTANEOUS

## 2020-10-17 ENCOUNTER — Other Ambulatory Visit: Payer: Medicare Other

## 2020-10-17 ENCOUNTER — Ambulatory Visit: Payer: Medicare Other

## 2020-10-21 ENCOUNTER — Other Ambulatory Visit: Payer: Self-pay | Admitting: Cardiology

## 2020-10-29 ENCOUNTER — Telehealth: Payer: Self-pay | Admitting: Family Medicine

## 2020-10-29 DIAGNOSIS — D638 Anemia in other chronic diseases classified elsewhere: Secondary | ICD-10-CM

## 2020-10-29 DIAGNOSIS — Z79899 Other long term (current) drug therapy: Secondary | ICD-10-CM | POA: Insufficient documentation

## 2020-10-29 DIAGNOSIS — I10 Essential (primary) hypertension: Secondary | ICD-10-CM

## 2020-10-29 DIAGNOSIS — E785 Hyperlipidemia, unspecified: Secondary | ICD-10-CM

## 2020-10-29 DIAGNOSIS — E559 Vitamin D deficiency, unspecified: Secondary | ICD-10-CM

## 2020-10-29 NOTE — Telephone Encounter (Signed)
-----   Message from Ellamae Sia sent at 10/14/2020 10:54 AM EST ----- Regarding: Lab orders for Thursday, 3.10.22 Patient is scheduled for CPX labs, please order future labs, Thanks , Karna Christmas

## 2020-10-30 ENCOUNTER — Ambulatory Visit (INDEPENDENT_AMBULATORY_CARE_PROVIDER_SITE_OTHER): Payer: Medicare Other

## 2020-10-30 ENCOUNTER — Other Ambulatory Visit: Payer: Self-pay

## 2020-10-30 DIAGNOSIS — Z0000 Encounter for general adult medical examination without abnormal findings: Secondary | ICD-10-CM | POA: Diagnosis not present

## 2020-10-30 NOTE — Progress Notes (Signed)
Subjective:   [name] [name] is a 80 y.o. female who presents for Medicare Annual (Subsequent) preventive examination.  Review of Systems: N/A      I connected with the patient today by telephone and verified that I am speaking with the correct person using two identifiers. Location patient: home Location nurse: work Persons participating in the telephone visit: patient, nurse.   I discussed the limitations, risks, security and privacy concerns of performing an evaluation and management service by telephone and the availability of in person appointments. I also discussed with the patient that there may be a patient responsible charge related to this service. The patient expressed understanding and verbally consented to this telephonic visit.        Cardiac Risk Factors include: advanced age (>83men, >60 women);hypertension;Other (see comment), Risk factor comments: hyperlipidemia     Objective:    Today's Vitals   10/30/20 1151  PainSc: 7    There is no height or weight on file to calculate BMI.  Advanced Directives 10/30/2020 01/04/2020 01/03/2020 10/27/2019 06/29/2019 06/18/2019 02/18/2019  Does Patient Have a Medical Advance Directive? Yes No No Yes Yes Yes Yes  Type of Paramedic of [place];Living will - - [place];Living will [place];Living will [place];Living will [place];Living will  Does patient want to make changes to medical advance directive? - - - - - - -  Copy of [place] in Chart? No - copy requested - - No - copy requested No - copy requested - -  Would patient like information on creating a medical advance directive? - No - Patient declined No - Patient declined - - - -  Pre-existing out of facility DNR order (yellow form or pink MOST form) - - - - - - -    Current Medications (verified) Outpatient Encounter Medications as of 10/30/2020   Medication Sig  . acetaminophen (TYLENOL) 500 MG tablet Take 1,000 mg by mouth 2 (two) times daily as needed for moderate pain or headache.   . albuterol (VENTOLIN HFA) 108 (90 Base) MCG/ACT inhaler Inhale 1 puff into the lungs every 6 (six) hours as needed (for cough).  Marland Kitchen atorvastatin (LIPITOR) 20 MG tablet TAKE 1 TABLET BY MOUTH EVERY DAY  . busPIRone (BUSPAR) 15 MG tablet Take 1 tablet (15 mg total) by mouth daily.  . cetirizine (ZYRTEC) 10 MG tablet TAKE 1 TABLET BY MOUTH EVERY DAY  . clopidogrel (PLAVIX) 75 MG tablet TAKE 1 TABLET BY MOUTH EVERY DAY WITH BREAKFAST  . fluticasone (FLONASE) 50 MCG/ACT nasal spray SPRAY 2 SPRAYS INTO EACH NOSTRIL EVERY DAY  . HYDROcodone-acetaminophen (NORCO/VICODIN) 5-325 MG tablet Take 1 tablet by mouth 3 (three) times daily as needed for moderate pain.  Marland Kitchen losartan (COZAAR) 25 MG tablet Take 1 tablet (25 mg total) by mouth daily.  . Melatonin 3 MG CAPS Take 6 mg by mouth at bedtime.   . methocarbamol (ROBAXIN) 500 MG tablet TAKE 1 TABLET BY MOUTH EVERY 8 HOURS AS NEEDED FOR MUSCLE SPASMS.  . metoprolol tartrate (LOPRESSOR) 25 MG tablet TAKE 1 TABLET BY MOUTH TWICE A DAY  . nystatin (MYCOSTATIN/NYSTOP) powder Apply topically 2 (two) times daily as needed. To affected areas to prevent yeast (in skin folds)  . pantoprazole (PROTONIX) 40 MG tablet TAKE 1 TABLET BY MOUTH EVERY DAY  . sertraline (ZOLOFT) 25 MG tablet TAKE 1 TABLET BY MOUTH EVERY DAY  . Vitamin D, Cholecalciferol,  25 MCG (1000 UT) CAPS Take 2,000 Units by mouth in the morning and at bedtime.    No facility-administered encounter medications on file as of 10/30/2020.    Allergies (verified) Sulfonamide derivatives, Ace inhibitors, Hydrochlorothiazide, Oxaprozin, Pregabalin, and Amoxicillin-pot clavulanate   History: Past Medical History:  Diagnosis Date  . Anemia    of chronic disease  . Anxiety   . Arthritis    "psoratic arthritis; new dx" (09/11/2016)  . CAD S/P percutaneous coronary  angioplasty 08/2016   mLCx 90% -> 0% (2.75 mm x 16 mm) Synergy DES PCI Ostial D1 70% (not PTCA target). Moderate ostial and mD2.  LAD tandem 50% stenoses w/ negative FFR (0.86)  . Cancer ([place])    skin cancer on leg  . Chronic lower back pain   . Complication of anesthesia 1980 and 1982   trouble waking up after breast biopsy, many surgeries since no problem  . Depression   . Dyspnea on effort - CHRONIC    partial relief of Sx post PCI of L Circumflex.  . Fibrocystic breast   . GERD (gastroesophageal reflux disease)   . Headache    "monthly" (09/11/2016)  . History of blood transfusion    "several since age 78; I've had them after most of my surgeries" (09/11/2016)  . History of myocardial infarction     - Myoview [name] 2018 indicated small Inferior - Inferoseptal Infarct (vs. rest ischemia). Echo with inferoseptal hypokinesis.  Marland Kitchen Hx of skin cancer, basal cell 2013 and 2012   right inner, lower leg; several scattered around my body  . Hypertension   . Iron deficiency anemia    "I've had an iron infusion"  . Osteoarthritis   . Osteopenia   . Peptic ulcer disease    H pylori  . PMR (polymyalgia rheumatica) (HCC)   . PONV (postoperative nausea and vomiting)   . Psoriasis   . TIA (transient ischemic attack) 06/03/2020   Symptoms of right arm weakness/numbness and slurred speech as well as right droopm => MRI brain/MRA head normal.  Normal echo normal carotid Dopplers.   Past Surgical History:  Procedure Laterality Date  . APPENDECTOMY    . BACK SURGERY     x 6   . BREAST BIOPSY Left 1980 and 1982   "benign"  . CARDIAC CATHETERIZATION N/A 09/11/2016   Procedure: Left Heart Cath and Coronary Angiography;  Surgeon: Leonie Man, MD;  Location: [facility];  Service: Cardiovascular: mLCx 90% * (PCI). Ostial D1 70% (not PTCA target). Mod ostial and mD2.  LAD tandem 50% stenoses w/ negative FFR (0.86)  . CARDIAC CATHETERIZATION N/A 09/11/2016   Procedure: Intravascular Pressure  Wire/FFR Study;  Surgeon: Leonie Man, MD;  Location: [facility];  Service: Cardiovascular: --  LAD tandem 50% stenoses w/ negative FFR (0.86  . CARDIAC CATHETERIZATION N/A 09/11/2016   Procedure: Coronary Stent Intervention;  Surgeon: Leonie Man, MD;  Location: [facility];  Service: Cardiovascular: -- DES PCI mLCx 90%--0% SYNERGY DES 2.75 MM X 16 MM.  Marland Kitchen CARDIAC CATHETERIZATION  2001   non-obstructive CAD  . CATARACT EXTRACTION W/ INTRAOCULAR LENS  IMPLANT, BILATERAL  July -  August 2017  . DILATION AND CURETTAGE OF UTERUS  X 3  . ESOPHAGOGASTRODUODENOSCOPY  03/2009   ulcer,HP, GERD stricture  . EXCISIONAL HEMORRHOIDECTOMY    . HIP ARTHROPLASTY Left 2010  . JOINT REPLACEMENT    . KNEE ARTHROSCOPY Bilateral   . LAPAROSCOPIC CHOLECYSTECTOMY  2005  . LUMBAR DISC SURGERY  11/1984; 1987; 1989; 2004; 2005 X 2   deg. disk lumber spine   . MOHS SURGERY Right    "inside my lower leg"  . NASAL SINUS SURGERY  1970s  . NM MYOVIEW LTD  08/28/2016   INTERMEDIATE RISK (b/c reduced EF & ? small infarct) -- EF ~35-40%.  ? Prior Small Inferior-apical & septal infarct (w/o ischemia).  Difuse HK - worse in inferoapex.  Suggest prior infarct with no ischemia. Ischemic area correlates with echo WMA  . ORIF FEMUR FRACTURE Right 01/04/2020   Procedure: OPEN REDUCTION INTERNAL FIXATION (ORIF) DISTAL FEMUR FRACTURE;  Surgeon: Rod Can, MD;  Location: [facility];  Service: Orthopedics;  Laterality: Right;  . REVERSE SHOULDER ARTHROPLASTY Left 12/17/2017   Procedure: LEFT REVERSE SHOULDER ARTHROPLASTY;  Surgeon: Netta Cedars, MD;  Location: [facility];  Service: Orthopedics;  Laterality: Left;  . TOTAL HIP ARTHROPLASTY Right 04/04/2013   Procedure: RIGHT TOTAL HIP ARTHROPLASTY ANTERIOR APPROACH;  Surgeon: Mauri Pole, MD;  Location: [facility];  Service: Orthopedics;  Laterality: Right;  . TOTAL KNEE ARTHROPLASTY Right 2009  . TRANSTHORACIC ECHOCARDIOGRAM  1/'18; 1/'19   a) 1/'18: mild conc LVH. EF  45-50% - anteroseptal & inferoseptal HK. Mod AI.; b) 1/'19 - post PCI: (post PCI) --> EF normalized:  Normal LV systolic function; mild diastolic dysfunction; mild   LVH; sclerotic aortic valve with mild AI.  No RWMA  . TRANSTHORACIC ECHOCARDIOGRAM  06/04/2020    for TIA: Normal LV size and function.  No or WMA.  EF 60 to 65%.  GR 1 DD with elevated LAP.  Normal RV size and function.  Normal RVP, RAP.  Mild aortic valve calcification-sclerosis but no stenosis.  Trivial AI.  No embolic source  . TUBAL LIGATION    . VAGINAL HYSTERECTOMY  1970s   partial    Family History  Problem Relation Age of Onset  . Arthritis Mother   . Emphysema Mother   . Hyperlipidemia Mother   . Hypertension Mother   . Heart disease Mother   . Heart disease Brother        CAD  . Diabetes Paternal Grandmother   . Heart disease Maternal Aunt    Social History   Socioeconomic History  . Marital status: Married    Spouse name: Not on file  . Number of children: Not on file  . Years of education: Not on file  . Highest education level: Not on file  Occupational History  . Not on file  Tobacco Use  . Smoking status: Never Smoker  . Smokeless tobacco: Never Used  Vaping Use  . Vaping Use: Never used  Substance and Sexual Activity  . Alcohol use: Yes    Alcohol/week: 0.0 standard drinks    Comment: 09/11/2016 "might have a margarita q 3-4 months"  . Drug use: No  . Sexual activity: Not Currently  Other Topics Concern  . Not on file  Social History Narrative   Now Widowed as of [name] 2020.   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   Social Determinants of Health   Financial Resource Strain: Low Risk   . Difficulty of Paying Living Expenses: Not hard at all  Food Insecurity: No Food Insecurity  . Worried  About Running Out of Food in the Last  Year: Never true  . Ran Out of Food in the Last Year: Never true  Transportation Needs: No Transportation Needs  . Lack of Transportation (Medical): No  . Lack of Transportation (Non-Medical): No  Physical Activity: Inactive  . Days of Exercise per Week: 0 days  . Minutes of Exercise per Session: 0 min  Stress: No Stress Concern Present  . Feeling of Stress : Not at all  Social Connections: Not on file    Tobacco Counseling Counseling given: Not Answered   Clinical Intake:  Pre-visit preparation completed: Yes  Pain : 0-10 Pain Score: 7  Pain Type: Chronic pain Pain Descriptors / Indicators: Aching Pain Onset: More than a month ago Pain Frequency: Intermittent     Nutritional Risks: None Diabetes: No  How often do you need to have someone help you when you read instructions, pamphlets, or other written materials from your doctor or pharmacy?: 1 - Never  Diabetic: No Nutrition Risk Assessment:  Has the patient had any N/V/D within the last 2 months?  No  Does the patient have any non-healing wounds?  No  Has the patient had any unintentional weight loss or weight gain?  No   Diabetes:  Is the patient diabetic?  No  If diabetic, was a CBG obtained today?  N/A Did the patient bring in their glucometer from home?  N/A How often do you monitor your CBG's? N/A.   Financial Strains and Diabetes Management:  Are you having any financial strains with the device, your supplies or your medication? N/A.  Does the patient want to be seen by Chronic Care Management for management of their diabetes?  N/A Would the patient like to be referred to a Nutritionist or for Diabetic Management?  N/A   Interpreter Needed?: No  Information entered by :: CJohnson, LPN   Activities of Daily Living In your present state of health, do you have any difficulty performing the following activities: 10/30/2020 01/04/2020  Hearing? Y N  Comment some hearing loss noted -  Vision? Y N   Comment trouble seeing well. Patient will schedule eye appointment soon -  Difficulty concentrating or making decisions? Y N  Comment forgets names sometimes -  Walking or climbing stairs? N Y  Dressing or bathing? N N  Doing errands, shopping? N N  Preparing Food and eating ? N -  Using the Toilet? N -  In the past six months, have you accidently leaked urine? Y -  Comment wears depends daily -  Do you have problems with loss of bowel control? N -  Managing your Medications? N -  Managing your Finances? N -  Housekeeping or managing your Housekeeping? N -  Some recent data might be hidden    Patient Care Team: Tower, [name], MD as PCP - General Ellyn Hack Leonie Green, MD as PCP - Cardiology (Cardiology) Suella Broad, MD as Consulting Physician (Physical Medicine and Rehabilitation) Gavin Pound, MD as Consulting Physician (Rheumatology) [place], Jennefer Bravo, MD as Referring Physician (Dermatology) Nicholas Lose, MD as Consulting Physician (Hematology and Oncology)  Indicate any recent Medical Services you may have received from other than [facility] providers in the past year (date may be approximate).     Assessment:   This is a routine wellness examination for [name] [name].  Hearing/Vision screen  Hearing Screening   125Hz  250Hz  500Hz  1000Hz  2000Hz  3000Hz  4000Hz  6000Hz  8000Hz   Right ear:  Left ear:           Vision Screening Comments: Advised patient to get annual eye exams  Dietary issues and exercise activities discussed: Current Exercise Habits: The patient does not participate in regular exercise at present, Exercise limited by: None identified  Goals    . Follow up with Primary Care Provider     Starting 10/19/2018, I will continue to take medications as prescribed and to keep appointments with PCP as scheduled.     . Patient Stated     10/27/2019, I will maintain and continue medications as prescribed.     . Patient Stated     10/30/2020, I will maintain and  continue medications as prescribed.       Depression Screen PHQ 2/9 Scores 10/30/2020 07/22/2020 10/27/2019 10/19/2018 10/12/2017 08/11/2016 07/17/2013  PHQ - 2 Score 0 0 0 0 0 1 0  PHQ- 9 Score 0 - 0 0 7 - -    Fall Risk Fall Risk  10/30/2020 07/22/2020 10/27/2019 10/19/2018 10/12/2017  Falls in the past year? 1 0 0 0 No  Comment - - - - -  Number falls in past yr: 0 0 0 - -  Injury with Fall? 1 0 0 - -  Comment broke her femur - - - -  Risk for fall due to : Medication side effect;Impaired balance/gait - Medication side effect - -  Follow up Falls evaluation completed;Falls prevention discussed Falls evaluation completed Falls evaluation completed;Falls prevention discussed - -    FALL RISK PREVENTION PERTAINING TO THE HOME:  Any stairs in or around the home? Yes  If so, are there any without handrails? No  Home free of loose throw rugs in walkways, pet beds, electrical cords, etc? Yes  Adequate lighting in your home to reduce risk of falls? Yes   ASSISTIVE DEVICES UTILIZED TO PREVENT FALLS:  Life alert? No  Use of a cane, walker or w/c? Yes  Grab bars in the bathroom? Yes  Shower chair or bench in shower? Yes  Elevated toilet seat or a handicapped toilet? Yes   TIMED UP AND GO:  Was the test performed? N/A telephone visit.   Cognitive Function: MMSE - Mini Mental State Exam 10/30/2020 10/27/2019 10/19/2018 10/12/2017 08/11/2016  Orientation to time 5 5 5 5 5   Orientation to Place 5 5 5 5 5   Registration 3 3 3 3 3   Attention/ Calculation 5 5 0 0 0  Recall 3 3 3 3 3   Language- name 2 objects - - 0 0 0  Language- repeat 1 1 1 1 1   Language- follow 3 step command - - 3 3 3   Language- read & follow direction - - 0 0 0  Write a sentence - - 0 0 0  Copy design - - 0 0 0  Total score - - 20 20 20   Mini Cog  Mini-Cog screen was completed. Maximum score is 22. A value of 0 denotes this part of the MMSE was not completed or the patient failed this part of the Mini-Cog screening.        Immunizations Immunization History  Administered Date(s) Administered  . Fluad Quad(high Dose 65+) 06/30/2019, 05/06/2020  . Influenza,inj,Quad PF,6+ Mos 07/17/2013, 09/12/2016, 10/12/2017, 10/25/2018  . PFIZER(Purple Top)SARS-COV-2 Vaccination 09/14/2019, 10/05/2019  . Pneumococcal Conjugate-13 10/12/2017  . Pneumococcal Polysaccharide-23 01/26/2008, 04/16/2010  . Td 03/28/1998, 01/26/2008    TDAP status: Due, Education has been provided regarding the importance of this vaccine. Advised may receive  this vaccine at local pharmacy or Health Dept. Aware to provide a copy of the vaccination record if obtained from local pharmacy or Health Dept. Verbalized acceptance and understanding.  Flu Vaccine status: Up to date  Pneumococcal vaccine status: Up to date  Covid-19 vaccine status: Completed vaccines  Qualifies for Shingles Vaccine? Yes   Zostavax completed No   Shingrix Completed?: No.    Education has been provided regarding the importance of this vaccine. Patient has been advised to call insurance company to determine out of pocket expense if they have not yet received this vaccine. Advised may also receive vaccine at local pharmacy or Health Dept. Verbalized acceptance and understanding.  Screening Tests Health Maintenance  Topic Date Due  . MAMMOGRAM  02/15/2020  . COVID-19 Vaccine (3 - Booster for Pfizer series) 04/03/2020  . TETANUS/TDAP  10/31/2023 (Originally 01/25/2018)  . INFLUENZA VACCINE  Completed  . DEXA SCAN  Completed  . PNA vac Low Risk Adult  Completed  . HPV VACCINES  Aged Out    Health Maintenance  Health Maintenance Due  Topic Date Due  . MAMMOGRAM  02/15/2020  . COVID-19 Vaccine (3 - Booster for Pfizer series) 04/03/2020    Colorectal cancer screening: No longer required.    Mammogram status: scheduled 11/04/20 per patient   Bone Density status: due, will discuss with provider   Lung Cancer Screening: (Low Dose CT Chest recommended if Age 38-80  years, 30 pack-year currently smoking OR have quit w/in 15years.) does not qualify.   Additional Screening:  Hepatitis C Screening: does not qualify; Completed N/A  Vision Screening: Recommended annual ophthalmology exams for early detection of glaucoma and other disorders of the eye. Is the patient up to date with their annual eye exam?  No , Patient will schedule appointment soon Who is the provider or what is the name of the office in which the patient attends annual eye exams? Can't remember name If pt is not established with a provider, would they like to be referred to a provider to establish care? No .   Dental Screening: Recommended annual dental exams for proper oral hygiene  Community Resource Referral / Chronic Care Management: CRR required this visit?  No   CCM required this visit?  No      Plan:     I have personally reviewed and noted the following in the patient's chart:   . Medical and social history . Use of alcohol, tobacco or illicit drugs  . Current medications and supplements . Functional ability and status . Nutritional status . Physical activity . Advanced directives . List of other physicians . Hospitalizations, surgeries, and ER visits in previous 12 months . Vitals . Screenings to include cognitive, depression, and falls . Referrals and appointments  In addition, I have reviewed and discussed with patient certain preventive protocols, quality metrics, and best practice recommendations. A written personalized care plan for preventive services as well as general preventive health recommendations were provided to patient.   Due to this being a telephonic visit, the after visit summary with patients personalized plan was offered to patient via office or my-chart. Patient preferred to pick up at office at next visit or via mychart.   Andrez Grime, LPN   12/29/996

## 2020-10-30 NOTE — Progress Notes (Signed)
PCP notes:  Health Maintenance: Tdap- insurance Mammogram- scheduled 11/04/20 per patient Dexa- due Covid booster- will bring card to physical so we can document date in chart   Abnormal Screenings: none   Patient concerns: none   Nurse concerns: none   Next PCP appt.: 11/05/2020 @ 9:30 am

## 2020-10-30 NOTE — Patient Instructions (Signed)
[name] [name] , Thank you for taking time to come for your Medicare Wellness Visit. I appreciate your ongoing commitment to your health goals. Please review the following plan we discussed and let me know if I can assist you in the future.   Screening recommendations/referrals: Colonoscopy: no longer required  Mammogram: scheduled 11/04/2020 per patient Bone Density: due, will discuss with provider  Recommended yearly ophthalmology/optometry visit for glaucoma screening and checkup Recommended yearly dental visit for hygiene and checkup  Vaccinations: Influenza vaccine: Up to date, completed 05/06/2020, due 03/2021 Pneumococcal vaccine: Completed series Tdap vaccine: decline-insurance Shingles vaccine: due, check with your insurance regarding coverage if interested    Covid-19:Completed series, please bring date for booster so we can document in chart  Advanced directives: Please bring a copy of your POA (Power of [place]) and/or Living Will to your next appointment.   Conditions/risks identified: hypertension, hyperlipidemia   Next appointment: Follow up in one year for your annual wellness visit    Preventive Care 81 Years and Older, Female Preventive care refers to lifestyle choices and visits with your health care provider that can promote health and wellness. What does preventive care include?  A yearly physical exam. This is also called an annual well check.  Dental exams once or twice a year.  Routine eye exams. Ask your health care provider how often you should have your eyes checked.  Personal lifestyle choices, including:  Daily care of your teeth and gums.  Regular physical activity.  Eating a healthy diet.  Avoiding tobacco and drug use.  Limiting alcohol use.  Practicing safe sex.  Taking low-dose aspirin every day.  Taking vitamin and mineral supplements as recommended by your health care provider. What happens during an annual well check? The services and  screenings done by your health care provider during your annual well check will depend on your age, overall health, lifestyle risk factors, and family history of disease. Counseling  Your health care provider may ask you questions about your:  Alcohol use.  Tobacco use.  Drug use.  Emotional well-being.  Home and relationship well-being.  Sexual activity.  Eating habits.  History of falls.  Memory and ability to understand (cognition).  Work and work Statistician.  Reproductive health. Screening  You may have the following tests or measurements:  Height, weight, and BMI.  Blood pressure.  Lipid and cholesterol levels. These may be checked every 5 years, or more frequently if you are over 86 years old.  Skin check.  Lung cancer screening. You may have this screening every year starting at age 90 if you have a 30-pack-year history of smoking and currently smoke or have quit within the past 15 years.  Fecal occult blood test (FOBT) of the stool. You may have this test every year starting at age 30.  Flexible sigmoidoscopy or colonoscopy. You may have a sigmoidoscopy every 5 years or a colonoscopy every 10 years starting at age 92.  Hepatitis C blood test.  Hepatitis B blood test.  Sexually transmitted disease (STD) testing.  Diabetes screening. This is done by checking your blood sugar (glucose) after you have not eaten for a while (fasting). You may have this done every 1-3 years.  Bone density scan. This is done to screen for osteoporosis. You may have this done starting at age 88.  Mammogram. This may be done every 1-2 years. Talk to your health care provider about how often you should have regular mammograms. Talk with your health care provider  about your test results, treatment options, and if necessary, the need for more tests. Vaccines  Your health care provider may recommend certain vaccines, such as:  Influenza vaccine. This is recommended every  year.  Tetanus, diphtheria, and acellular pertussis (Tdap, Td) vaccine. You may need a Td booster every 10 years.  Zoster vaccine. You may need this after age 58.  Pneumococcal 13-valent conjugate (PCV13) vaccine. One dose is recommended after age 75.  Pneumococcal polysaccharide (PPSV23) vaccine. One dose is recommended after age 67. Talk to your health care provider about which screenings and vaccines you need and how often you need them. This information is not intended to replace advice given to you by your health care provider. Make sure you discuss any questions you have with your health care provider. Document Released: 09/06/2015 Document Revised: 04/29/2016 Document Reviewed: 06/11/2015 Elsevier Interactive Patient Education  2017 [employer] Prevention in the Home Falls can cause injuries. They can happen to people of all ages. There are many things you can do to make your home safe and to help prevent falls. What can I do on the outside of my home?  Regularly fix the edges of walkways and driveways and fix any cracks.  Remove anything that might make you trip as you walk through a door, such as a raised step or threshold.  Trim any bushes or trees on the path to your home.  Use bright outdoor lighting.  Clear any walking paths of anything that might make someone trip, such as rocks or tools.  Regularly check to see if handrails are loose or broken. Make sure that both sides of any steps have handrails.  Any raised decks and porches should have guardrails on the edges.  Have any leaves, snow, or ice cleared regularly.  Use sand or salt on walking paths during winter.  Clean up any spills in your garage right away. This includes oil or grease spills. What can I do in the bathroom?  Use night lights.  Install grab bars by the toilet and in the tub and shower. Do not use towel bars as grab bars.  Use non-skid mats or decals in the tub or shower.  If you  need to sit down in the shower, use a plastic, non-slip stool.  Keep the floor dry. Clean up any water that spills on the floor as soon as it happens.  Remove soap buildup in the tub or shower regularly.  Attach bath mats securely with double-sided non-slip rug tape.  Do not have throw rugs and other things on the floor that can make you trip. What can I do in the bedroom?  Use night lights.  Make sure that you have a light by your bed that is easy to reach.  Do not use any sheets or blankets that are too big for your bed. They should not hang down onto the floor.  Have a firm chair that has side arms. You can use this for support while you get dressed.  Do not have throw rugs and other things on the floor that can make you trip. What can I do in the kitchen?  Clean up any spills right away.  Avoid walking on wet floors.  Keep items that you use a lot in easy-to-reach places.  If you need to reach something above you, use a strong step stool that has a grab bar.  Keep electrical cords out of the way.  Do not use floor polish or  wax that makes floors slippery. If you must use wax, use non-skid floor wax.  Do not have throw rugs and other things on the floor that can make you trip. What can I do with my stairs?  Do not leave any items on the stairs.  Make sure that there are handrails on both sides of the stairs and use them. Fix handrails that are broken or loose. Make sure that handrails are as long as the stairways.  Check any carpeting to make sure that it is firmly attached to the stairs. Fix any carpet that is loose or worn.  Avoid having throw rugs at the top or bottom of the stairs. If you do have throw rugs, attach them to the floor with carpet tape.  Make sure that you have a light switch at the top of the stairs and the bottom of the stairs. If you do not have them, ask someone to add them for you. What else can I do to help prevent falls?  Wear shoes  that:  Do not have high heels.  Have rubber bottoms.  Are comfortable and fit you well.  Are closed at the toe. Do not wear sandals.  If you use a stepladder:  Make sure that it is fully opened. Do not climb a closed stepladder.  Make sure that both sides of the stepladder are locked into place.  Ask someone to hold it for you, if possible.  Clearly mark and make sure that you can see:  Any grab bars or handrails.  First and last steps.  Where the edge of each step is.  Use tools that help you move around (mobility aids) if they are needed. These include:  Canes.  Walkers.  Scooters.  Crutches.  Turn on the lights when you go into a dark area. Replace any light bulbs as soon as they burn out.  Set up your furniture so you have a clear path. Avoid moving your furniture around.  If any of your floors are uneven, fix them.  If there are any pets around you, be aware of where they are.  Review your medicines with your doctor. Some medicines can make you feel dizzy. This can increase your chance of falling. Ask your doctor what other things that you can do to help prevent falls. This information is not intended to replace advice given to you by your health care provider. Make sure you discuss any questions you have with your health care provider. Document Released: 06/06/2009 Document Revised: 01/16/2016 Document Reviewed: 09/14/2014 Elsevier Interactive Patient Education  2017 [employer].

## 2020-10-31 ENCOUNTER — Other Ambulatory Visit (INDEPENDENT_AMBULATORY_CARE_PROVIDER_SITE_OTHER): Payer: Medicare Other

## 2020-10-31 ENCOUNTER — Other Ambulatory Visit: Payer: Self-pay

## 2020-10-31 ENCOUNTER — Ambulatory Visit: Payer: Medicare Other

## 2020-10-31 DIAGNOSIS — E785 Hyperlipidemia, unspecified: Secondary | ICD-10-CM

## 2020-10-31 DIAGNOSIS — E559 Vitamin D deficiency, unspecified: Secondary | ICD-10-CM

## 2020-10-31 DIAGNOSIS — I10 Essential (primary) hypertension: Secondary | ICD-10-CM | POA: Diagnosis not present

## 2020-10-31 DIAGNOSIS — Z79899 Other long term (current) drug therapy: Secondary | ICD-10-CM

## 2020-10-31 DIAGNOSIS — D638 Anemia in other chronic diseases classified elsewhere: Secondary | ICD-10-CM

## 2020-10-31 LAB — LIPID PANEL
Cholesterol: 159 mg/dL (ref 0–200)
HDL: 46.7 mg/dL (ref >39.00)
LDL Cholesterol: 74 mg/dL (ref 0–99)
NonHDL: 112.07
Total CHOL/HDL Ratio: 3
Triglycerides: 188 mg/dL — ABNORMAL HIGH (ref 0.0–149.0)
VLDL: 37.6 mg/dL (ref 0.0–40.0)

## 2020-10-31 LAB — COMPREHENSIVE METABOLIC PANEL
ALT: 13 U/L (ref 0–35)
AST: 19 U/L (ref 0–37)
Albumin: 4 g/dL (ref 3.5–5.2)
Alkaline Phosphatase: 60 U/L (ref 39–117)
BUN: 24 mg/dL — ABNORMAL HIGH (ref 6–23)
CO2: 29 mEq/L (ref 19–32)
Calcium: 9.4 mg/dL (ref 8.4–10.5)
Chloride: 106 mEq/L (ref 96–112)
Creatinine, Ser: 1.13 mg/dL (ref 0.40–1.20)
GFR: 45.73 mL/min — ABNORMAL LOW (ref >60.00)
Glucose, Bld: 91 mg/dL (ref 70–99)
Potassium: 4.4 mEq/L (ref 3.5–5.1)
Sodium: 140 mEq/L (ref 135–145)
Total Bilirubin: 0.5 mg/dL (ref 0.2–1.2)
Total Protein: 7 g/dL (ref 6.0–8.3)

## 2020-10-31 LAB — CBC WITH DIFFERENTIAL/PLATELET
Basophils Absolute: 0.1 10*3/uL (ref 0.0–0.1)
Basophils Relative: 1.1 % (ref 0.0–3.0)
Eosinophils Absolute: 0.1 10*3/uL (ref 0.0–0.7)
Eosinophils Relative: 1.6 % (ref 0.0–5.0)
HCT: 35.5 % — ABNORMAL LOW (ref 36.0–46.0)
Hemoglobin: 11.4 g/dL — ABNORMAL LOW (ref 12.0–15.0)
Lymphocytes Relative: 33.9 % (ref 12.0–46.0)
Lymphs Abs: 1.6 10*3/uL (ref 0.7–4.0)
MCHC: 32.1 g/dL (ref 30.0–36.0)
MCV: 85.8 fl (ref 78.0–100.0)
Monocytes Absolute: 0.3 10*3/uL (ref 0.1–1.0)
Monocytes Relative: 6.3 % (ref 3.0–12.0)
Neutro Abs: 2.7 10*3/uL (ref 1.4–7.7)
Neutrophils Relative %: 57.1 % (ref 43.0–77.0)
Platelets: 247 10*3/uL (ref 150.0–400.0)
RBC: 4.13 Mil/uL (ref 3.87–5.11)
RDW: 15.6 % — ABNORMAL HIGH (ref 11.5–15.5)
WBC: 4.7 10*3/uL (ref 4.0–10.5)

## 2020-10-31 LAB — TSH: TSH: 2.5 u[IU]/mL (ref 0.35–4.50)

## 2020-10-31 LAB — VITAMIN B12: Vitamin B-12: 345 pg/mL (ref 211–911)

## 2020-10-31 LAB — VITAMIN D 25 HYDROXY (VIT D DEFICIENCY, FRACTURES): VITD: 46.94 ng/mL (ref 30.00–100.00)

## 2020-11-02 ENCOUNTER — Other Ambulatory Visit: Payer: Self-pay | Admitting: Family Medicine

## 2020-11-04 ENCOUNTER — Encounter: Payer: Self-pay | Admitting: Family Medicine

## 2020-11-05 ENCOUNTER — Encounter: Payer: Medicare Other | Admitting: Family Medicine

## 2020-11-19 ENCOUNTER — Other Ambulatory Visit: Payer: Self-pay | Admitting: Family Medicine

## 2020-11-20 NOTE — Telephone Encounter (Signed)
CPE scheduled on 11/29/20, last filled on 07/26/20 #30 tabs with 3 refills

## 2020-11-29 ENCOUNTER — Other Ambulatory Visit: Payer: Self-pay

## 2020-11-29 ENCOUNTER — Ambulatory Visit (INDEPENDENT_AMBULATORY_CARE_PROVIDER_SITE_OTHER): Payer: Medicare Other | Admitting: Family Medicine

## 2020-11-29 ENCOUNTER — Encounter: Payer: Self-pay | Admitting: Family Medicine

## 2020-11-29 VITALS — BP 126/72 | HR 67 | Temp 96.9°F | Ht 63.0 in | Wt 177.4 lb

## 2020-11-29 DIAGNOSIS — N289 Disorder of kidney and ureter, unspecified: Secondary | ICD-10-CM | POA: Diagnosis not present

## 2020-11-29 DIAGNOSIS — E559 Vitamin D deficiency, unspecified: Secondary | ICD-10-CM

## 2020-11-29 DIAGNOSIS — T380X5A Adverse effect of glucocorticoids and synthetic analogues, initial encounter: Secondary | ICD-10-CM

## 2020-11-29 DIAGNOSIS — I10 Essential (primary) hypertension: Secondary | ICD-10-CM

## 2020-11-29 DIAGNOSIS — E785 Hyperlipidemia, unspecified: Secondary | ICD-10-CM

## 2020-11-29 DIAGNOSIS — M8580 Other specified disorders of bone density and structure, unspecified site: Secondary | ICD-10-CM

## 2020-11-29 DIAGNOSIS — Z0000 Encounter for general adult medical examination without abnormal findings: Secondary | ICD-10-CM

## 2020-11-29 DIAGNOSIS — Z79899 Other long term (current) drug therapy: Secondary | ICD-10-CM

## 2020-11-29 DIAGNOSIS — M353 Polymyalgia rheumatica: Secondary | ICD-10-CM

## 2020-11-29 DIAGNOSIS — D638 Anemia in other chronic diseases classified elsewhere: Secondary | ICD-10-CM

## 2020-11-29 DIAGNOSIS — E669 Obesity, unspecified: Secondary | ICD-10-CM

## 2020-11-29 DIAGNOSIS — E2839 Other primary ovarian failure: Secondary | ICD-10-CM

## 2020-11-29 MED ORDER — SERTRALINE HCL 25 MG PO TABS: 25.0000 mg | ORAL_TABLET | Freq: Every day | ORAL | 3 refills | Status: DC

## 2020-11-29 NOTE — Patient Instructions (Addendum)
If you are interested in the new shingles vaccine (Shingrix) - call your local pharmacy to check on coverage and availability  If affordable, get on a wait list at your pharmacy to get the vaccine.  I think another covid booster is ok after April 21   Be mindful of your diet for cholesterol  Avoid red meat/ fried foods/ egg yolks/ fatty breakfast meats/ butter, cheese and high fat dairy/ and shellfish    Take care of yourself

## 2020-11-29 NOTE — Progress Notes (Signed)
Subjective:    Patient ID: [name] [name], female    DOB: 1941-06-11, 80 y.o.   MRN: [id number]  This visit occurred during the SARS-CoV-2 public health emergency.  Safety protocols were in place, including screening questions prior to the visit, additional usage of staff PPE, and extensive cleaning of exam room while observing appropriate contact time as indicated for disinfecting solutions.    HPI   Here for health maintenance exam and to review chronic medical problems    Wt Readings from Last 3 Encounters:  11/29/20 177 lb 6 oz (80.5 kg)  10/15/20 176 lb 11.2 oz (80.2 kg)  09/16/20 175 lb 6.4 oz (79.6 kg)   31.42 kg/m  Had R hand carpal tunnel -helped  Arthritis is still problematic   Due to f/u with rheumatologist for pmr  Was previously on methotrexate   Had amw on 3/9  Mammogram -about a month ago at [facility]  Self breast exam - no lumps   dexa 6/19 - (? One since)  Falls-none fx-leg fracture was the last Supplements  D level 47 Exercise -not a lot due to arthritis pain   Interested in 2nd covid booster  (last vaccine was 12/21)  Zoster status -had the zostavax years ago  Interested in shingrix   Colonoscopy 2010 No problems with stools   HTN bp is stable today  No cp or palpitations or headaches or edema  No side effects to medicines  BP Readings from Last 3 Encounters:  11/29/20 126/72  10/15/20 (!) 164/61  09/16/20 (!) 149/66    Losartan 25 mg daily  Metoprolol 25 mg bid Off hctz due to low na level in the past  Pulse Readings from Last 3 Encounters:  11/29/20 67  10/15/20 63  09/16/20 67   Renal insuff Lab Results  Component Value Date   CREATININE 1.13 10/31/2020   BUN 24 (H) 10/31/2020   NA 140 10/31/2020   K 4.4 10/31/2020   CL 106 10/31/2020   CO2 29 10/31/2020   Improved  Takes protonix 40 mg for GERD Lab Results  Component Value Date   VITAMINB12 345 10/31/2020   Anemia of chronic dz Lab Results  Component Value Date    WBC 4.7 10/31/2020   HGB 11.4 (L) 10/31/2020   HCT 35.5 (L) 10/31/2020   MCV 85.8 10/31/2020   PLT 247.0 10/31/2020  doing better  Had shot 2 weeks prior    Hyperlipidemia Lab Results  Component Value Date   CHOL 159 10/31/2020   CHOL 136 10/27/2019   CHOL 105 10/19/2018   Lab Results  Component Value Date   HDL 46.70 10/31/2020   HDL 44.70 10/27/2019   HDL 36.00 (L) 10/19/2018   Lab Results  Component Value Date   LDLCALC 74 10/31/2020   LDLCALC 55 10/27/2019   LDLCALC 52 10/27/2016   Lab Results  Component Value Date   TRIG 188.0 (H) 10/31/2020   TRIG 182.0 (H) 10/27/2019   TRIG 131 06/18/2019   Lab Results  Component Value Date   CHOLHDL 3 10/31/2020   CHOLHDL 3 10/27/2019   CHOLHDL 3 10/19/2018   Lab Results  Component Value Date   LDLDIRECT 35.0 10/19/2018   LDLDIRECT 74.0 10/12/2017   LDLDIRECT 107.0 12/10/2015  atorvastatin 20 mg daily No missed doses  Plans to eat more veggies soon   Patient Active Problem List   Diagnosis Date Noted  . Current use of proton pump inhibitor 10/29/2020  . Cough 07/24/2020  .  History of transient ischemic attack (TIA) 06/04/2020  . Normochromic normocytic anemia   . History of femur fracture 02/04/2020  . Renal insufficiency 11/01/2019  . Pedal edema 10/04/2018  . Grief reaction 09/17/2018  . 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 Routine general medical examination at a health care facility 10/14/2017  . Preop cardiovascular exam 10/01/2017  . CAD S/P percutaneous coronary angioplasty 01/27/2017  . Aortic regurgitation 01/27/2017  . Dyspnea on exertion 08/19/2016  . Left bundle branch block (LBBB) on electrocardiogram 08/19/2016  . Estrogen deficiency 10/08/2015  . Chronic fatigue 08/20/2015  . Deficiency anemia 11/17/2013  . Encounter for Medicare annual wellness exam 07/17/2013  . PMR (polymyalgia rheumatica) (HCC) 07/17/2013  . Intertrigo 04/19/2013  . Expected blood loss anemia 04/05/2013  .  Obesity (BMI 30-39.9) 04/05/2013  . 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 IBS (irritable bowel syndrome) 05/30/2012  . Anemia of chronic disease 04/16/2010  . Generalized anxiety disorder 04/16/2010  . GERD 03/04/2009  . Vitamin D deficiency 03/12/2008  . URINARY TRACT INFECTION, RECURRENT 08/07/2007  . 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 FIBROCYSTIC BREAST DISEASE, HX OF 02/11/2007  . Hyperlipidemia with target low density lipoprotein (LDL) cholesterol less than 70 mg/dL 01/10/2007  . DEPRESSION 01/10/2007  . RETINAL VEIN OCCLUSION 01/10/2007  . Essential hypertension 01/10/2007  . PEPTIC ULCER DISEASE 01/10/2007  . 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 SKIN CANCER, HX OF 01/10/2007   Past Medical History:  Diagnosis Date  . Anemia    of chronic disease  . Anxiety   . Arthritis    "psoratic arthritis; new dx" (09/11/2016)  . CAD S/P percutaneous coronary angioplasty 08/2016   mLCx 90% -> 0% (2.75 mm x 16 mm) Synergy DES PCI Ostial D1 70% (not PTCA target). Moderate ostial and mD2.  LAD tandem 50% stenoses w/ negative FFR (0.86)  . Cancer ([place])    skin cancer on leg  . Chronic lower back pain   . Complication of anesthesia 1980 and 1982   trouble waking up after breast biopsy, many surgeries since no problem  . Depression   . Dyspnea on effort - CHRONIC    partial relief of Sx post PCI of L Circumflex.  . Fibrocystic breast   . GERD (gastroesophageal reflux disease)   . Headache    "monthly" (09/11/2016)  . History of blood transfusion    "several since age 24; I've had them after most of my surgeries" (09/11/2016)  . History of myocardial infarction     - Myoview [name] 2018 indicated small Inferior - Inferoseptal Infarct (vs. rest ischemia). Echo with inferoseptal hypokinesis.  Marland Kitchen Hx of skin cancer, basal cell 2013 and 2012   right inner, lower leg; several scattered around my body  . Hypertension   . Iron deficiency anemia    "I've had an iron infusion"  . Osteoarthritis   .  Osteopenia   . Peptic ulcer disease    H pylori  . PMR (polymyalgia rheumatica) (HCC)   . PONV (postoperative nausea and vomiting)   . Psoriasis   . TIA (transient ischemic attack) 06/03/2020   Symptoms of right arm weakness/numbness and slurred speech as well as right droopm => MRI brain/MRA head normal.  Normal echo normal carotid Dopplers.   Past Surgical History:  Procedure Laterality Date  . APPENDECTOMY    . BACK SURGERY     x 6   . BREAST BIOPSY Left 1980 and 1982   "benign"  . CARDIAC CATHETERIZATION N/A 09/11/2016  Procedure: Left Heart Cath and Coronary Angiography;  Surgeon: Leonie Man, MD;  Location: [facility];  Service: Cardiovascular: mLCx 90% * (PCI). Ostial D1 70% (not PTCA target). Mod ostial and mD2.  LAD tandem 50% stenoses w/ negative FFR (0.86)  . CARDIAC CATHETERIZATION N/A 09/11/2016   Procedure: Intravascular Pressure Wire/FFR Study;  Surgeon: Leonie Man, MD;  Location: [facility];  Service: Cardiovascular: --  LAD tandem 50% stenoses w/ negative FFR (0.86  . CARDIAC CATHETERIZATION N/A 09/11/2016   Procedure: Coronary Stent Intervention;  Surgeon: Leonie Man, MD;  Location: [facility];  Service: Cardiovascular: -- DES PCI mLCx 90%--0% SYNERGY DES 2.75 MM X 16 MM.  Marland Kitchen CARDIAC CATHETERIZATION  2001   non-obstructive CAD  . CATARACT EXTRACTION W/ INTRAOCULAR LENS  IMPLANT, BILATERAL  July -  August 2017  . DILATION AND CURETTAGE OF UTERUS  X 3  . ESOPHAGOGASTRODUODENOSCOPY  03/2009   ulcer,HP, GERD stricture  . EXCISIONAL HEMORRHOIDECTOMY    . HIP ARTHROPLASTY Left 2010  . JOINT REPLACEMENT    . KNEE ARTHROSCOPY Bilateral   . LAPAROSCOPIC CHOLECYSTECTOMY  2005  . LUMBAR DISC SURGERY  11/1984; 1987; 1989; 2004; 2005 X 2   deg. disk lumber spine   . MOHS SURGERY Right    "inside my lower leg"  . NASAL SINUS SURGERY  1970s  . NM MYOVIEW LTD  08/28/2016   INTERMEDIATE RISK (b/c reduced EF & ? small infarct) -- EF ~35-40%.  ?  Prior Small Inferior-apical & septal infarct (w/o ischemia).  Difuse HK - worse in inferoapex.  Suggest prior infarct with no ischemia. Ischemic area correlates with echo WMA  . ORIF FEMUR FRACTURE Right 01/04/2020   Procedure: OPEN REDUCTION INTERNAL FIXATION (ORIF) DISTAL FEMUR FRACTURE;  Surgeon: Rod Can, MD;  Location: [facility];  Service: Orthopedics;  Laterality: Right;  . REVERSE SHOULDER ARTHROPLASTY Left 12/17/2017   Procedure: LEFT REVERSE SHOULDER ARTHROPLASTY;  Surgeon: Netta Cedars, MD;  Location: [facility];  Service: Orthopedics;  Laterality: Left;  . TOTAL HIP ARTHROPLASTY Right 04/04/2013   Procedure: RIGHT TOTAL HIP ARTHROPLASTY ANTERIOR APPROACH;  Surgeon: Mauri Pole, MD;  Location: [facility];  Service: Orthopedics;  Laterality: Right;  . TOTAL KNEE ARTHROPLASTY Right 2009  . TRANSTHORACIC ECHOCARDIOGRAM  1/'18; 1/'19   a) 1/'18: mild conc LVH. EF 45-50% - anteroseptal & inferoseptal HK. Mod AI.; b) 1/'19 - post PCI: (post PCI) --> EF normalized:  Normal LV systolic function; mild diastolic dysfunction; mild   LVH; sclerotic aortic valve with mild AI.  No RWMA  . TRANSTHORACIC ECHOCARDIOGRAM  06/04/2020    for TIA: Normal LV size and function.  No or WMA.  EF 60 to 65%.  GR 1 DD with elevated LAP.  Normal RV size and function.  Normal RVP, RAP.  Mild aortic valve calcification-sclerosis but no stenosis.  Trivial AI.  No embolic source  . TUBAL LIGATION    . VAGINAL HYSTERECTOMY  1970s   partial    Social History   Tobacco Use  . Smoking status: Never Smoker  . Smokeless tobacco: Never Used  Vaping Use  . Vaping Use: Never used  Substance Use Topics  . Alcohol use: Yes    Alcohol/week: 0.0 standard drinks    Comment: 09/11/2016 "might have a margarita q 3-4 months"  . Drug use: No   Family History  Problem Relation Age of Onset  . Arthritis Mother   . Emphysema Mother   .  Hyperlipidemia Mother   . Hypertension Mother   . Heart disease Mother   . Heart disease  Brother        CAD  . Diabetes Paternal Grandmother   . Heart disease Maternal Aunt    Allergies  Allergen Reactions  . 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 Ace Inhibitors     cough  . Hydrochlorothiazide     Hyponatremia   . Oxaprozin     GI upset   . Pregabalin Swelling    SWELLING REACTION UNSPECIFIED   . Amoxicillin-Pot Clavulanate Nausea And Vomiting    gi upset Has patient had a PCN reaction causing immediate rash, facial/tongue/throat swelling, SOB or lightheadedness with hypotension: No Has patient had a PCN reaction causing severe rash involving mucus membranes or skin necrosis: No Has patient had a PCN reaction that required hospitalization No Has patient had a PCN reaction occurring within the last 10 years: No If all of the above answers are "NO", then may proceed with Cephalosporin use.    Current Outpatient Medications on File Prior to Visit  Medication Sig Dispense Refill  . acetaminophen (TYLENOL) 500 MG tablet Take 1,000 mg by mouth 2 (two) times daily as needed for moderate pain or headache.     Marland Kitchen atorvastatin (LIPITOR) 20 MG tablet TAKE 1 TABLET BY MOUTH EVERY DAY 90 tablet 2  . busPIRone (BUSPAR) 15 MG tablet Take 1 tablet (15 mg total) by mouth daily. 90 tablet 3  . cetirizine (ZYRTEC) 10 MG tablet TAKE 1 TABLET BY MOUTH EVERY DAY 90 tablet 3  . clopidogrel (PLAVIX) 75 MG tablet TAKE 1 TABLET BY MOUTH EVERY DAY WITH BREAKFAST 90 tablet 2  . fluticasone (FLONASE) 50 MCG/ACT nasal spray SPRAY 2 SPRAYS INTO EACH NOSTRIL EVERY DAY (Patient taking differently: Place 1 spray into both nostrils daily as needed.) 48 mL 1  . HYDROcodone-acetaminophen (NORCO/VICODIN) 5-325 MG tablet Take 1 tablet by mouth 3 (three) times daily as needed for moderate pain. 30 tablet 0  . losartan (COZAAR) 25 MG tablet Take 1 tablet (25 mg total) by mouth daily. 90 tablet 3  . Melatonin 3 MG CAPS Take 6 mg by mouth at bedtime.     .  methocarbamol (ROBAXIN) 500 MG tablet TAKE 1 TABLET BY MOUTH EVERY 8 HOURS AS NEEDED FOR MUSCLE SPASMS. 30 tablet 3  . metoprolol tartrate (LOPRESSOR) 25 MG tablet TAKE 1 TABLET BY MOUTH TWICE A DAY 180 tablet 2  . nystatin (MYCOSTATIN/NYSTOP) powder Apply topically 2 (two) times daily as needed. To affected areas to prevent yeast (in skin folds) 30 g 1  . pantoprazole (PROTONIX) 40 MG tablet TAKE 1 TABLET BY MOUTH EVERY DAY 90 tablet 2  . Vitamin D, Cholecalciferol, 25 MCG (1000 UT) CAPS Take 2,000 Units by mouth in the morning and at bedtime.     Marland Kitchen albuterol (VENTOLIN HFA) 108 (90 Base) MCG/ACT inhaler Inhale 1 puff into the lungs every 6 (six) hours as needed (for cough). (Patient not taking: Reported on 11/29/2020) 8 g 1   No current facility-administered medications on file prior to visit.     Review of Systems  Constitutional: Positive for fatigue. Negative for activity change, appetite change, fever and unexpected weight change.  HENT: Negative for congestion, ear pain, rhinorrhea, sinus pressure and sore throat.   Eyes: Negative for pain, redness and visual disturbance.  Respiratory: Negative for cough, shortness of breath and wheezing.   Cardiovascular: Negative for  chest pain and palpitations.  Gastrointestinal: Negative for abdominal pain, blood in stool, constipation and diarrhea.  Endocrine: Negative for polydipsia and polyuria.  Genitourinary: Negative for dysuria, frequency and urgency.  Musculoskeletal: Positive for arthralgias, back pain and myalgias.  Skin: Negative for pallor and rash.  Allergic/Immunologic: Negative for environmental allergies.  Neurological: Negative for dizziness, syncope and headaches.  Hematological: Negative for adenopathy. Does not bruise/bleed easily.  Psychiatric/Behavioral: Negative for decreased concentration and dysphoric mood. The patient is not nervous/anxious.        Objective:   Physical Exam Constitutional:      General: She is not in  acute distress.    Appearance: Normal appearance. She is well-developed. She is obese. She is not ill-appearing or diaphoretic.  HENT:     Head: Normocephalic and atraumatic.     Right Ear: Tympanic membrane, ear canal and external ear normal.     Left Ear: Tympanic membrane, ear canal and external ear normal.     Nose: Nose normal. No congestion.     Mouth/Throat:     Mouth: Mucous membranes are moist.     Pharynx: Oropharynx is clear. No posterior oropharyngeal erythema.  Eyes:     General: No scleral icterus.    Extraocular Movements: Extraocular movements intact.     Conjunctiva/sclera: Conjunctivae normal.     Pupils: Pupils are equal, round, and reactive to light.  Neck:     Thyroid: No thyromegaly.     Vascular: No carotid bruit or JVD.  Cardiovascular:     Rate and Rhythm: Normal rate and regular rhythm.     Pulses: Normal pulses.     Heart sounds: Normal heart sounds. No gallop.   Pulmonary:     Effort: Pulmonary effort is normal. No respiratory distress.     Breath sounds: Normal breath sounds. No wheezing.     Comments: Good air exch Chest:     Chest wall: No tenderness.  Abdominal:     General: Bowel sounds are normal. There is no distension or abdominal bruit.     Palpations: Abdomen is soft. There is no mass.     Tenderness: There is no abdominal tenderness.     Hernia: No hernia is present.  Genitourinary:    Comments: Declined breast exam    Musculoskeletal:        General: No tenderness. Normal range of motion.     Cervical back: Normal range of motion and neck supple. No rigidity. No muscular tenderness.     Right lower leg: No edema.     Left lower leg: No edema.     Comments: No kyphosis   Lymphadenopathy:     Cervical: No cervical adenopathy.  Skin:    General: Skin is warm and dry.     Coloration: Skin is not pale.     Findings: No erythema or rash.     Comments: Solar lentigines diffusely Some sks  Neurological:     Mental Status: She is alert.  Mental status is at baseline.     Cranial Nerves: No cranial nerve deficit.     Motor: No abnormal muscle tone.     Coordination: Coordination normal.     Gait: Gait normal.     Deep Tendon Reflexes: Reflexes are normal and symmetric. Reflexes normal.  Psychiatric:        Mood and Affect: Mood normal.        Cognition and Memory: Cognition and memory normal.  Assessment & Plan:   Problem List Items Addressed This Visit      Cardiovascular and Mediastinum   Essential hypertension (Chronic)    bp in fair control at this time  BP Readings from Last 1 Encounters:  11/29/20 126/72   No changes needed Most recent labs reviewed  Disc lifstyle change with low sodium diet and exercise  Plan to continue losartan 25 mg daily and metoprolol 25 mg bid Pulse of 67        Musculoskeletal and Integument   Steroid-induced osteopenia    Pt thinks she had one dexa since 6/19  We will send for it  No new fractures Taking vit D Exercise is limited due to chronic pain        Genitourinary   Renal insufficiency    Improved off of hctz Encouraged good hydration        Other   Hyperlipidemia with target low density lipoprotein (LDL) cholesterol less than 70 mg/dL (Chronic)    Disc goals for lipids and reasons to control them Rev last labs with pt Rev low sat fat diet in detail In setting of vascular dz Atorvastatin 20 mg daily  LDL of 74  Followed by cardiology      Anemia of chronic disease (Chronic)    Continues hematology care with iron and epo tx Doing well  Hb 11.4      Vitamin D deficiency    D level of 47 Vitamin D level is therapeutic with current supplementation Disc importance of this to bone and overall health       Obesity (BMI 30-39.9)    Discussed how this problem influences overall health and the risks it imposes  Reviewed plan for weight loss with lower calorie diet (via better food choices and also portion control or program like weight  watchers) and exercise building up to or more than 30 minutes 5 days per week including some aerobic activity   Exercise is limited due to mobility and pain issues      PMR (polymyalgia rheumatica) (HCC)    No treatment currently  She is overdue for rheumatology visit  Cbc is improved off methotrexate       Routine general medical examination at a health care facility - Primary    Reviewed health habits including diet and exercise and skin cancer prevention Reviewed appropriate screening tests for age  Also reviewed health mt list, fam hx and immunization status , as well as social and family history   See HPI amw reviewed  Interested in shingrix if covered Interested in 2nd covid booster in light of immune status  Colonoscopy 2010 and does not desire further screening  Sent for last dexa and mammogram reports  Labs reviewed       Current use of proton pump inhibitor    Vit B12 and vit D levels are in tx range

## 2020-12-01 NOTE — Assessment & Plan Note (Signed)
Discussed how this problem influences overall health and the risks it imposes  Reviewed plan for weight loss with lower calorie diet (via better food choices and also portion control or program like weight watchers) and exercise building up to or more than 30 minutes 5 days per week including some aerobic activity   Exercise is limited due to mobility and pain issues

## 2020-12-01 NOTE — Assessment & Plan Note (Signed)
No treatment currently  She is overdue for rheumatology visit  Cbc is improved off methotrexate

## 2020-12-01 NOTE — Assessment & Plan Note (Signed)
D level of 47 Vitamin D level is therapeutic with current supplementation Disc importance of this to bone and overall health

## 2020-12-01 NOTE — Assessment & Plan Note (Signed)
Pt thinks she had one dexa since 6/19  We will send for it  No new fractures Taking vit D Exercise is limited due to chronic pain

## 2020-12-01 NOTE — Assessment & Plan Note (Signed)
Improved off of hctz Encouraged good hydration

## 2020-12-01 NOTE — Assessment & Plan Note (Signed)
bp in fair control at this time  BP Readings from Last 1 Encounters:  11/29/20 126/72   No changes needed Most recent labs reviewed  Disc lifstyle change with low sodium diet and exercise  Plan to continue losartan 25 mg daily and metoprolol 25 mg bid Pulse of 67

## 2020-12-01 NOTE — Assessment & Plan Note (Signed)
Disc goals for lipids and reasons to control them Rev last labs with pt Rev low sat fat diet in detail In setting of vascular dz Atorvastatin 20 mg daily  LDL of 74  Followed by cardiology

## 2020-12-01 NOTE — Assessment & Plan Note (Signed)
Reviewed health habits including diet and exercise and skin cancer prevention Reviewed appropriate screening tests for age  Also reviewed health mt list, fam hx and immunization status , as well as social and family history   See HPI amw reviewed  Interested in shingrix if covered Interested in 2nd covid booster in light of immune status  Colonoscopy 2010 and does not desire further screening  Sent for last dexa and mammogram reports  Labs reviewed

## 2020-12-01 NOTE — Assessment & Plan Note (Signed)
Vit B12 and vit D levels are in tx range

## 2020-12-01 NOTE — Assessment & Plan Note (Addendum)
Continues hematology care with iron and epo tx Doing well  Hb 11.4

## 2020-12-12 ENCOUNTER — Inpatient Hospital Stay: Payer: Medicare Other | Attending: Hematology and Oncology

## 2020-12-12 ENCOUNTER — Other Ambulatory Visit: Payer: Self-pay

## 2020-12-12 ENCOUNTER — Inpatient Hospital Stay: Payer: Medicare Other

## 2020-12-12 VITALS — BP 153/68 | HR 64 | Temp 98.2°F | Resp 20

## 2020-12-12 DIAGNOSIS — I132 Hypertensive heart and chronic kidney disease with heart failure and with stage 5 chronic kidney disease, or end stage renal disease: Secondary | ICD-10-CM | POA: Insufficient documentation

## 2020-12-12 DIAGNOSIS — Z79899 Other long term (current) drug therapy: Secondary | ICD-10-CM | POA: Diagnosis not present

## 2020-12-12 DIAGNOSIS — D638 Anemia in other chronic diseases classified elsewhere: Secondary | ICD-10-CM

## 2020-12-12 DIAGNOSIS — D631 Anemia in chronic kidney disease: Secondary | ICD-10-CM | POA: Diagnosis not present

## 2020-12-12 DIAGNOSIS — N186 End stage renal disease: Secondary | ICD-10-CM | POA: Diagnosis not present

## 2020-12-12 LAB — CMP (CANCER CENTER ONLY)
ALT: 12 U/L (ref 0–44)
AST: 21 U/L (ref 15–41)
Albumin: 3.8 g/dL (ref 3.5–5.0)
Alkaline Phosphatase: 73 U/L (ref 38–126)
Anion gap: 10 (ref 5–15)
BUN: 18 mg/dL (ref 8–23)
CO2: 24 mmol/L (ref 22–32)
Calcium: 8.8 mg/dL — ABNORMAL LOW (ref 8.9–10.3)
Chloride: 108 mmol/L (ref 98–111)
Creatinine: 1.1 mg/dL — ABNORMAL HIGH (ref 0.44–1.00)
GFR, Estimated: 50 mL/min — ABNORMAL LOW
Glucose, Bld: 75 mg/dL (ref 70–99)
Potassium: 4.4 mmol/L (ref 3.5–5.1)
Sodium: 142 mmol/L (ref 135–145)
Total Bilirubin: 0.3 mg/dL (ref 0.3–1.2)
Total Protein: 7.1 g/dL (ref 6.5–8.1)

## 2020-12-12 LAB — CBC WITH DIFFERENTIAL (CANCER CENTER ONLY)
Abs Immature Granulocytes: 0.02 10*3/uL (ref 0.00–0.07)
Basophils Absolute: 0 10*3/uL (ref 0.0–0.1)
Basophils Relative: 1 %
Eosinophils Absolute: 0.1 10*3/uL (ref 0.0–0.5)
Eosinophils Relative: 2 %
HCT: 32.7 % — ABNORMAL LOW (ref 36.0–46.0)
Hemoglobin: 10.4 g/dL — ABNORMAL LOW (ref 12.0–15.0)
Immature Granulocytes: 0 %
Lymphocytes Relative: 35 %
Lymphs Abs: 2.3 10*3/uL (ref 0.7–4.0)
MCH: 28.1 pg (ref 26.0–34.0)
MCHC: 31.8 g/dL (ref 30.0–36.0)
MCV: 88.4 fL (ref 80.0–100.0)
Monocytes Absolute: 0.5 10*3/uL (ref 0.1–1.0)
Monocytes Relative: 8 %
Neutro Abs: 3.6 10*3/uL (ref 1.7–7.7)
Neutrophils Relative %: 54 %
Platelet Count: 248 10*3/uL (ref 150–400)
RBC: 3.7 MIL/uL — ABNORMAL LOW (ref 3.87–5.11)
RDW: 13.7 % (ref 11.5–15.5)
WBC Count: 6.5 10*3/uL (ref 4.0–10.5)
nRBC: 0 % (ref 0.0–0.2)

## 2020-12-12 MED ORDER — EPOETIN ALFA-EPBX 40000 UNIT/ML IJ SOLN
INTRAMUSCULAR | Status: AC
  Filled 2020-12-12: qty 1

## 2020-12-12 MED ORDER — EPOETIN ALFA-EPBX 40000 UNIT/ML IJ SOLN
40000.0000 [IU] | Freq: Once | INTRAMUSCULAR | Status: AC
  Administered 2020-12-12: 40000 [IU] via SUBCUTANEOUS

## 2020-12-12 NOTE — Patient Instructions (Signed)

## 2020-12-23 ENCOUNTER — Telehealth: Payer: Self-pay | Admitting: *Deleted

## 2020-12-23 NOTE — Telephone Encounter (Signed)
She is covid immunized which is good  Please check on her tomorrow to see how she is feeling

## 2020-12-23 NOTE — Telephone Encounter (Signed)
Patient's daughter [name] [name] (on [place]) called stating that her mom was not feeling well yesterday and had a fever of 101.3.  [name] [name] stated that her mom had a runny nose, head congestion and achy. [name] [name] stated that she decided to do a home covid test and it was positive. [name] [name] stated that she has given her mom tylenol for her fever. [name] [name] stated that her mom does not have a fever this morning and states that she feels some better. [name] [name] denies that her mom has chest congestion or SOB. Lynda Rainwater that her mom needs to be in quarantine. [name] [name] stated that they were out and about a lot on Friday. [name] [name] was advised that her mom should rest, drink lots of fluids and eat well balanced meals. [name] [name] was given ER precautions and she verbalized understanding.

## 2020-12-24 NOTE — Telephone Encounter (Signed)
Pt said she's doing "ok for having covid". Pt said she hasn't had any fever at all today. The highest fever she ran was 101.3 and any time she's had a fever it was at night but again all day no fever today. Pt said she has a little stuffy/runny nose but she has been using her nasal spray and that's helped. Pt said she has her baseline SOB nothing worse then normal and she's been using her inhaler prn. Pt said she's fine and has no severe SOB/ trouble breathing, and no chest pain, just a little stuffy nose and a low grade fever at night, she said if she has any questions or concerns she will call us asap, but thanked [name] for checkingon her.

## 2020-12-24 NOTE — Telephone Encounter (Signed)
Aware, reassuring

## 2020-12-30 NOTE — Telephone Encounter (Signed)
Bethena Roys (DPR signed) left v/m that pt had + covid test 12/23/20 and pt has been quarantining, no difficulty breathing , and no fever. Pt has some sinus issues as noted earlier in this note. Bethena Roys thought she would double ck the covid test and tested pt with home test today for covid and test is still positive. Bethena Roys wants to know how long pt could be + for covid and is there reason for concern because pt is still covid +. Bethena Roys request cb.

## 2020-12-30 NOTE — Telephone Encounter (Signed)
Pt's daughter Bethena Roys notified of Dr. Marliss Coots comments and verbalized understanding

## 2020-12-30 NOTE — Telephone Encounter (Signed)
I think it can stay positive for a few weeks.  That should not affect her prognosis, etc.  Continue to isolate and treat symptoms and keep [name] posted

## 2021-02-04 ENCOUNTER — Other Ambulatory Visit: Payer: Self-pay | Admitting: Family Medicine

## 2021-02-06 ENCOUNTER — Other Ambulatory Visit: Payer: Self-pay | Admitting: Cardiology

## 2021-02-13 ENCOUNTER — Other Ambulatory Visit: Payer: Self-pay

## 2021-02-13 ENCOUNTER — Inpatient Hospital Stay: Payer: Medicare Other

## 2021-02-13 ENCOUNTER — Inpatient Hospital Stay: Payer: Medicare Other | Attending: Hematology and Oncology

## 2021-02-13 VITALS — BP 156/63 | HR 57 | Temp 98.1°F | Resp 16

## 2021-02-13 DIAGNOSIS — D638 Anemia in other chronic diseases classified elsewhere: Secondary | ICD-10-CM | POA: Diagnosis present

## 2021-02-13 DIAGNOSIS — J9601 Acute respiratory failure with hypoxia: Secondary | ICD-10-CM | POA: Diagnosis not present

## 2021-02-13 LAB — CMP (CANCER CENTER ONLY)
ALT: 9 U/L (ref 0–44)
AST: 17 U/L (ref 15–41)
Albumin: 3.5 g/dL (ref 3.5–5.0)
Alkaline Phosphatase: 68 U/L (ref 38–126)
Anion gap: 9 (ref 5–15)
BUN: 29 mg/dL — ABNORMAL HIGH (ref 8–23)
CO2: 24 mmol/L (ref 22–32)
Calcium: 8.9 mg/dL (ref 8.9–10.3)
Chloride: 106 mmol/L (ref 98–111)
Creatinine: 1.13 mg/dL — ABNORMAL HIGH (ref 0.44–1.00)
GFR, Estimated: 49 mL/min — ABNORMAL LOW (ref >60)
Glucose, Bld: 91 mg/dL (ref 70–99)
Potassium: 4.5 mmol/L (ref 3.5–5.1)
Sodium: 139 mmol/L (ref 135–145)
Total Bilirubin: 0.5 mg/dL (ref 0.3–1.2)
Total Protein: 7.1 g/dL (ref 6.5–8.1)

## 2021-02-13 LAB — CBC WITH DIFFERENTIAL (CANCER CENTER ONLY)
Abs Immature Granulocytes: 0.02 10*3/uL (ref 0.00–0.07)
Basophils Absolute: 0 10*3/uL (ref 0.0–0.1)
Basophils Relative: 1 %
Eosinophils Absolute: 0.2 10*3/uL (ref 0.0–0.5)
Eosinophils Relative: 4 %
HCT: 30.7 % — ABNORMAL LOW (ref 36.0–46.0)
Hemoglobin: 9.7 g/dL — ABNORMAL LOW (ref 12.0–15.0)
Immature Granulocytes: 0 %
Lymphocytes Relative: 24 %
Lymphs Abs: 1.6 10*3/uL (ref 0.7–4.0)
MCH: 28.4 pg (ref 26.0–34.0)
MCHC: 31.6 g/dL (ref 30.0–36.0)
MCV: 90 fL (ref 80.0–100.0)
Monocytes Absolute: 0.5 10*3/uL (ref 0.1–1.0)
Monocytes Relative: 7 %
Neutro Abs: 4.3 10*3/uL (ref 1.7–7.7)
Neutrophils Relative %: 64 %
Platelet Count: 212 10*3/uL (ref 150–400)
RBC: 3.41 MIL/uL — ABNORMAL LOW (ref 3.87–5.11)
RDW: 13.6 % (ref 11.5–15.5)
WBC Count: 6.7 10*3/uL (ref 4.0–10.5)
nRBC: 0 % (ref 0.0–0.2)

## 2021-02-13 MED ORDER — EPOETIN ALFA-EPBX 40000 UNIT/ML IJ SOLN
INTRAMUSCULAR | Status: AC
  Filled 2021-02-13: qty 1

## 2021-02-13 MED ORDER — EPOETIN ALFA-EPBX 40000 UNIT/ML IJ SOLN
40000.0000 [IU] | Freq: Once | INTRAMUSCULAR | Status: AC
  Administered 2021-02-13: 40000 [IU] via SUBCUTANEOUS

## 2021-02-13 NOTE — Patient Instructions (Signed)
Epoetin Alfa injection What is this medication? EPOETIN ALFA (e POE e tin AL fa) helps your body make more red blood cells. This medicine is used to treat anemia caused by chronic kidney disease, cancer chemotherapy, or HIV-therapy. It may also be used before surgery if you have anemia. This medicine may be used for other purposes; ask your health care provider or pharmacist if you have questions. COMMON BRAND NAME(S): Epogen, Procrit, Retacrit What should I tell my care team before I take this medication? They need to know if you have any of these conditions: cancer heart disease high blood pressure history of blood clots history of stroke low levels of folate, iron, or vitamin B12 in the blood seizures an unusual or allergic reaction to erythropoietin, albumin, benzyl alcohol, hamster proteins, other medicines, foods, dyes, or preservatives pregnant or trying to get pregnant breast-feeding How should I use this medication? This medicine is for injection into a vein or under the skin. It is usually given by a health care professional in a hospital or clinic setting. If you get this medicine at home, you will be taught how to prepare and give this medicine. Use exactly as directed. Take your medicine at regular intervals. Do not take your medicine more often than directed. It is important that you put your used needles and syringes in a special sharps container. Do not put them in a trash can. If you do not have a sharps container, call your pharmacist or healthcare provider to get one. A special MedGuide will be given to you by the pharmacist with each prescription and refill. Be sure to read this information carefully each time. Talk to your pediatrician regarding the use of this medicine in children. While this drug may be prescribed for selected conditions, precautions do apply. Overdosage: If you think you have taken too much of this medicine contact a poison control center or emergency  room at once. NOTE: This medicine is only for you. Do not share this medicine with others. What if I miss a dose? If you miss a dose, take it as soon as you can. If it is almost time for your next dose, take only that dose. Do not take double or extra doses. What may interact with this medication? Interactions have not been studied. This list may not describe all possible interactions. Give your health care provider a list of all the medicines, herbs, non-prescription drugs, or dietary supplements you use. Also tell them if you smoke, drink alcohol, or use illegal drugs. Some items may interact with your medicine. What should I watch for while using this medication? Your condition will be monitored carefully while you are receiving this medicine. You may need blood work done while you are taking this medicine. This medicine may cause a decrease in vitamin B6. You should make sure that you get enough vitamin B6 while you are taking this medicine. Discuss the foods you eat and the vitamins you take with your health care professional. What side effects may I notice from receiving this medication? Side effects that you should report to your doctor or health care professional as soon as possible: allergic reactions like skin rash, itching or hives, swelling of the face, lips, or tongue seizures signs and symptoms of a blood clot such as breathing problems; changes in vision; chest pain; severe, sudden headache; pain, swelling, warmth in the leg; trouble speaking; sudden numbness or weakness of the face, arm or leg signs and symptoms of a stroke like   changes in vision; confusion; trouble speaking or understanding; severe headaches; sudden numbness or weakness of the face, arm or leg; trouble walking; dizziness; loss of balance or coordination Side effects that usually do not require medical attention (report to your doctor or health care professional if they continue or are  bothersome): chills cough dizziness fever headaches joint pain muscle cramps muscle pain nausea, vomiting pain, redness, or irritation at site where injected This list may not describe all possible side effects. Call your doctor for medical advice about side effects. You may report side effects to FDA at 1-800-FDA-1088. Where should I keep my medication? Keep out of the reach of children. Store in a refrigerator between 2 and 8 degrees C (36 and 46 degrees F). Do not freeze or shake. Throw away any unused portion if using a single-dose vial. Multi-dose vials can be kept in the refrigerator for up to 21 days after the initial dose. Throw away unused medicine. NOTE: This sheet is a summary. It may not cover all possible information. If you have questions about this medicine, talk to your doctor, pharmacist, or health care provider.  2022 [employer] Standard (2017-03-19 08:35:19)  

## 2021-03-05 ENCOUNTER — Other Ambulatory Visit: Payer: Self-pay | Admitting: Family Medicine

## 2021-03-06 NOTE — Telephone Encounter (Signed)
Last filled on 11/20/20 #30 tabs with 3 refills, CPE was on 11/29/20

## 2021-04-15 ENCOUNTER — Telehealth: Payer: Self-pay | Admitting: Hematology and Oncology

## 2021-04-16 NOTE — Progress Notes (Signed)
Patient Care Team: Tower, [name], MD as PCP - General Ellyn Hack Leonie Green, MD as PCP - Cardiology (Cardiology) Suella Broad, MD as Consulting Physician (Physical Medicine and Rehabilitation) Gavin Pound, MD as Consulting Physician (Rheumatology) Haverstock, Jennefer Bravo, MD as Referring Physician (Dermatology) Nicholas Lose, MD as Consulting Physician (Hematology and Oncology)  DIAGNOSIS:    ICD-10-CM   1. Anemia of chronic disease  D63.8        CHIEF COMPLIANT:  Follow-up of anemia  INTERVAL HISTORY: [name] [name] is a 80 y.o. with above-mentioned history of anemia who currently receives Retacrit. She presents to the clinic today for follow-up and Retacrit.  She feels generally tired.  She uses a cane to walk around but requires a wheelchair for long distances.  ALLERGIES:  is allergic to sulfonamide derivatives, ace inhibitors, hydrochlorothiazide, oxaprozin, pregabalin, and amoxicillin-pot clavulanate.  MEDICATIONS:  Current Outpatient Medications  Medication Sig Dispense Refill   acetaminophen (TYLENOL) 500 MG tablet Take 1,000 mg by mouth 2 (two) times daily as needed for moderate pain or headache.      albuterol (VENTOLIN HFA) 108 (90 Base) MCG/ACT inhaler INHALE 1 PUFF INTO THE LUNGS EVERY 6 (SIX) HOURS AS NEEDED (FOR COUGH). 6.7 each 1   atorvastatin (LIPITOR) 20 MG tablet TAKE 1 TABLET BY MOUTH EVERY DAY 90 tablet 2   busPIRone (BUSPAR) 15 MG tablet Take 1 tablet (15 mg total) by mouth daily. 90 tablet 3   cetirizine (ZYRTEC) 10 MG tablet TAKE 1 TABLET BY MOUTH EVERY DAY 90 tablet 3   clopidogrel (PLAVIX) 75 MG tablet TAKE 1 TABLET BY MOUTH EVERY DAY WITH BREAKFAST 90 tablet 2   fluticasone (FLONASE) 50 MCG/ACT nasal spray SPRAY 2 SPRAYS INTO EACH NOSTRIL EVERY DAY (Patient taking differently: Place 1 spray into both nostrils daily as needed.) 48 mL 1   HYDROcodone-acetaminophen (NORCO/VICODIN) 5-325 MG tablet Take 1 tablet by mouth 3 (three) times daily as needed for  moderate pain. 30 tablet 0   losartan (COZAAR) 25 MG tablet Take 1 tablet (25 mg total) by mouth daily. 90 tablet 3   Melatonin 3 MG CAPS Take 6 mg by mouth at bedtime.      methocarbamol (ROBAXIN) 500 MG tablet TAKE 1 TABLET BY MOUTH EVERY 8 HOURS AS NEEDED FOR MUSCLE SPASM 30 tablet 3   metoprolol tartrate (LOPRESSOR) 25 MG tablet TAKE 1 TABLET BY MOUTH TWICE A DAY 180 tablet 1   nystatin (MYCOSTATIN/NYSTOP) powder Apply topically 2 (two) times daily as needed. To affected areas to prevent yeast (in skin folds) 30 g 1   pantoprazole (PROTONIX) 40 MG tablet TAKE 1 TABLET BY MOUTH EVERY DAY 90 tablet 2   sertraline (ZOLOFT) 25 MG tablet Take 1 tablet (25 mg total) by mouth daily. 90 tablet 3   Vitamin D, Cholecalciferol, 25 MCG (1000 UT) CAPS Take 2,000 Units by mouth in the morning and at bedtime.      No current facility-administered medications for this visit.    PHYSICAL EXAMINATION: ECOG PERFORMANCE STATUS: 2 - Symptomatic, <50% confined to bed  Vitals:   04/17/21 1333  BP: (!) 169/63  Pulse: 67  Resp: 18  Temp: 97.7 F (36.5 C)  SpO2: 99%   Filed Weights   04/17/21 1333  Weight: 178 lb 8 oz (81 kg)    LABORATORY DATA:  I have reviewed the data as listed CMP Latest Ref Rng & Units 04/17/2021 02/13/2021 12/12/2020  Glucose 70 - 99 mg/dL 118(H) 91 75  BUN  8 - 23 mg/dL 23 29(H) 18  Creatinine 0.44 - 1.00 mg/dL 1.19(H) 1.13(H) 1.10(H)  Sodium 135 - 145 mmol/L 139 139 142  Potassium 3.5 - 5.1 mmol/L 4.4 4.5 4.4  Chloride 98 - 111 mmol/L 107 106 108  CO2 22 - 32 mmol/L 25 24 24   Calcium 8.9 - 10.3 mg/dL 9.2 8.9 8.8(L)  Total Protein 6.5 - 8.1 g/dL 7.2 7.1 7.1  Total Bilirubin 0.3 - 1.2 mg/dL 0.4 0.5 0.3  Alkaline Phos 38 - 126 U/L 69 68 73  AST 15 - 41 U/L 16 17 21   ALT 0 - 44 U/L 11 9 12     Lab Results  Component Value Date   WBC 6.2 04/17/2021   HGB 10.6 (L) 04/17/2021   HCT 33.5 (L) 04/17/2021   MCV 90.1 04/17/2021   PLT 229 04/17/2021   NEUTROABS 3.8 04/17/2021     ASSESSMENT & PLAN:  Anemia of chronic disease 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 (Inj if Hb is less than 11)    We will see her back in 3 months with labs   No orders of the defined types were placed in this encounter.  The patient has a good understanding of the overall plan. she agrees with it. she will call with any problems that may develop before the next visit here.  Total time spent: 20 mins including face to face time and time spent for planning, charting and coordination of care  Rulon Eisenmenger, MD, MPH 04/17/2021  I, Thana Ates, am acting as scribe for Dr. Nicholas Lose.  I have reviewed the above documentation for accuracy and completeness, and I agree with the above.

## 2021-04-17 ENCOUNTER — Inpatient Hospital Stay: Payer: Medicare Other

## 2021-04-17 ENCOUNTER — Other Ambulatory Visit: Payer: Self-pay

## 2021-04-17 ENCOUNTER — Inpatient Hospital Stay: Payer: Medicare Other | Attending: Hematology and Oncology

## 2021-04-17 ENCOUNTER — Inpatient Hospital Stay: Payer: Medicare Other | Admitting: Hematology and Oncology

## 2021-04-17 DIAGNOSIS — Z79899 Other long term (current) drug therapy: Secondary | ICD-10-CM | POA: Diagnosis not present

## 2021-04-17 DIAGNOSIS — D638 Anemia in other chronic diseases classified elsewhere: Secondary | ICD-10-CM

## 2021-04-17 DIAGNOSIS — M353 Polymyalgia rheumatica: Secondary | ICD-10-CM | POA: Insufficient documentation

## 2021-04-17 DIAGNOSIS — N189 Chronic kidney disease, unspecified: Secondary | ICD-10-CM

## 2021-04-17 DIAGNOSIS — D631 Anemia in chronic kidney disease: Secondary | ICD-10-CM

## 2021-04-17 DIAGNOSIS — I129 Hypertensive chronic kidney disease with stage 1 through stage 4 chronic kidney disease, or unspecified chronic kidney disease: Secondary | ICD-10-CM

## 2021-04-17 LAB — CMP (CANCER CENTER ONLY)
ALT: 11 U/L (ref 0–44)
AST: 16 U/L (ref 15–41)
Albumin: 3.7 g/dL (ref 3.5–5.0)
Alkaline Phosphatase: 69 U/L (ref 38–126)
Anion gap: 7 (ref 5–15)
BUN: 23 mg/dL (ref 8–23)
CO2: 25 mmol/L (ref 22–32)
Calcium: 9.2 mg/dL (ref 8.9–10.3)
Chloride: 107 mmol/L (ref 98–111)
Creatinine: 1.19 mg/dL — ABNORMAL HIGH (ref 0.44–1.00)
GFR, Estimated: 46 mL/min — ABNORMAL LOW (ref >60)
Glucose, Bld: 118 mg/dL — ABNORMAL HIGH (ref 70–99)
Potassium: 4.4 mmol/L (ref 3.5–5.1)
Sodium: 139 mmol/L (ref 135–145)
Total Bilirubin: 0.4 mg/dL (ref 0.3–1.2)
Total Protein: 7.2 g/dL (ref 6.5–8.1)

## 2021-04-17 LAB — CBC WITH DIFFERENTIAL (CANCER CENTER ONLY)
Abs Immature Granulocytes: 0.01 10*3/uL (ref 0.00–0.07)
Basophils Absolute: 0 10*3/uL (ref 0.0–0.1)
Basophils Relative: 1 %
Eosinophils Absolute: 0.1 10*3/uL (ref 0.0–0.5)
Eosinophils Relative: 2 %
HCT: 33.5 % — ABNORMAL LOW (ref 36.0–46.0)
Hemoglobin: 10.6 g/dL — ABNORMAL LOW (ref 12.0–15.0)
Immature Granulocytes: 0 %
Lymphocytes Relative: 29 %
Lymphs Abs: 1.8 10*3/uL (ref 0.7–4.0)
MCH: 28.5 pg (ref 26.0–34.0)
MCHC: 31.6 g/dL (ref 30.0–36.0)
MCV: 90.1 fL (ref 80.0–100.0)
Monocytes Absolute: 0.4 10*3/uL (ref 0.1–1.0)
Monocytes Relative: 7 %
Neutro Abs: 3.8 10*3/uL (ref 1.7–7.7)
Neutrophils Relative %: 61 %
Platelet Count: 229 10*3/uL (ref 150–400)
RBC: 3.72 MIL/uL — ABNORMAL LOW (ref 3.87–5.11)
RDW: 13.1 % (ref 11.5–15.5)
WBC Count: 6.2 10*3/uL (ref 4.0–10.5)
nRBC: 0 % (ref 0.0–0.2)

## 2021-04-17 MED ORDER — EPOETIN ALFA-EPBX 40000 UNIT/ML IJ SOLN
40000.0000 [IU] | Freq: Once | INTRAMUSCULAR | Status: AC
  Administered 2021-04-17: 40000 [IU] via SUBCUTANEOUS
  Filled 2021-04-17: qty 1

## 2021-04-17 NOTE — Assessment & Plan Note (Signed)
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 I will see her back in 6 months.  If her labs look great then we may consider switching her to every 64-month injections. She is contemplating on going back on methotrexate.  If that happens then she may be more anemic in the future.

## 2021-04-26 ENCOUNTER — Other Ambulatory Visit: Payer: Self-pay | Admitting: Family Medicine

## 2021-05-02 ENCOUNTER — Encounter: Payer: Self-pay | Admitting: Cardiology

## 2021-05-02 ENCOUNTER — Other Ambulatory Visit: Payer: Self-pay

## 2021-05-02 ENCOUNTER — Ambulatory Visit: Payer: Medicare Other | Admitting: Cardiology

## 2021-05-02 VITALS — BP 140/82 | HR 56 | Ht 63.75 in | Wt 182.4 lb

## 2021-05-02 DIAGNOSIS — I447 Left bundle-branch block, unspecified: Secondary | ICD-10-CM

## 2021-05-02 DIAGNOSIS — R0609 Other forms of dyspnea: Secondary | ICD-10-CM

## 2021-05-02 DIAGNOSIS — I10 Essential (primary) hypertension: Secondary | ICD-10-CM

## 2021-05-02 DIAGNOSIS — Z9861 Coronary angioplasty status: Secondary | ICD-10-CM

## 2021-05-02 DIAGNOSIS — R0600 Dyspnea, unspecified: Secondary | ICD-10-CM

## 2021-05-02 DIAGNOSIS — I25119 Atherosclerotic heart disease of native coronary artery with unspecified angina pectoris: Secondary | ICD-10-CM | POA: Insufficient documentation

## 2021-05-02 DIAGNOSIS — D649 Anemia, unspecified: Secondary | ICD-10-CM

## 2021-05-02 DIAGNOSIS — I2510 Atherosclerotic heart disease of native coronary artery without angina pectoris: Secondary | ICD-10-CM | POA: Insufficient documentation

## 2021-05-02 DIAGNOSIS — D638 Anemia in other chronic diseases classified elsewhere: Secondary | ICD-10-CM

## 2021-05-02 DIAGNOSIS — E785 Hyperlipidemia, unspecified: Secondary | ICD-10-CM

## 2021-05-02 NOTE — Progress Notes (Signed)
Primary Care Provider: Abner Greenspan, MD Cardiologist: Glenetta Hew, MD Electrophysiologist: None  Clinic Note: Chief Complaint  Patient presents with   Follow-up    Delayed 47-month.   Shortness of Breath    Son indicates that shortness of breath on exertion is worse.   Coronary Artery Disease    Never really had angina, presenting symptom was exertional dyspnea.    ===================================  ASSESSMENT/PLAN   Problem List Items Addressed This Visit       Cardiology Problems   CAD S/P percutaneous coronary angioplasty (Chronic)    Over 4 and half years out from LCx PCI.  Moderate LAD disease.  Known CAD with PCI and medical management of moderate LAD disease.  With LCx PCI, we chose to keep her on Plavix monotherapy.  Not on aspirin. Okay to hold Plavix 5 to 7 days preop for surgeries or procedures. Also okay to hold for upcoming dermatology procedure.      Relevant Orders   Cardiac Stress Test: Informed Consent Details: Physician/Practitioner Attestation; Transcribe to consent form and obtain patient signature   Hyperlipidemia with target low density lipoprotein (LDL) cholesterol less than 70 mg/dL (Chronic)    Most recent labs showed LDL of 74.  Pretty close to goal.  With her having exertional dyspnea and fatigue I am leery of pushing too hard.  For now continue current dose of statin.  Would like to see LDL less than 70 however.  If still not at goal at next check, would consider switching to rosuvastatin at same dose.      Relevant Orders   Cardiac Stress Test: Informed Consent Details: Physician/Practitioner Attestation; Transcribe to consent form and obtain patient signature   Essential hypertension (Chronic)    Blood pressure borderline today on low-dose losartan and metoprolol.  I am leery of pushing blood pressure medications too far because he has had some lower blood pressures in the past.  However continue to monitor may be consider increasing  losartan to 50 mg      Relevant Orders   Cardiac Stress Test: Informed Consent Details: Physician/Practitioner Attestation; Transcribe to consent form and obtain patient signature   Coronary artery disease involving native coronary artery with angina pectoris ([place]) (Chronic)    Known CAD with PCI and medical management of moderate LAD disease.  With LCx PCI, we chose to keep her on Plavix monotherapy.  Not on aspirin.      Relevant Orders   EKG 12-Lead   Cardiac Stress Test: Informed Consent Details: Physician/Practitioner Attestation; Transcribe to consent form and obtain patient signature   MYOCARDIAL PERFUSION IMAGING     Other   Dyspnea on exertion - Primary (Chronic)    Exertional dyspnea seems worse now per son's report.  She states things be stable.  However since she is having ongoing symptoms, we will evaluate with Myoview Stress Test.      Relevant Orders   EKG 12-Lead   Cardiac Stress Test: Informed Consent Details: Physician/Practitioner Attestation; Transcribe to consent form and obtain patient signature   MYOCARDIAL PERFUSION IMAGING   Left bundle branch block (LBBB) on electrocardiogram (Chronic)    Seems to be relatively asymptomatic.  He has not had a change in EF the echo fast.  Continue to monitor.  Would make treadmill stress test difficult to interpret therefore we are going with Myoview.      Normochromic normocytic anemia    Still doing iron supplementation along with erythropoietin/Drawbrdige bleeding.  Maintaining stable hemoglobin levels.  Anemia of chronic disease (Chronic)    Hemoglobin level still looks well controlled.  Continue to monitor.  Still getting "Epo injections "       ===================================  HPI:    [name] [name] is a 80 y.o. female with a PMH below who presents today for 11-month follow-up with complaints of exertional dyspnea.  Notable Cardiac History / [facility] Dec 2017-January 2018: Evaluated for DOE: Echo EF  45-50% Septal/Inferoseptal HK, Mod AI -> Abnormal Myoview EF ~44% with ? Prior infarct & peri-infarct ischemia CAD- DES PCI mid LCx (90%). Mod LAD Dz @ small Diag (FFR Negative) HTN, HLD,  Baseline DOE: CPX January 2020:- Suggested deconditioning.  Recommended PT OT referral. TIA 06/03/2020 -> recommended DAPT x3 weeks and then Plavix long-term along with statin.  Also placed on propranolol for GER GAD  Chronic Anemia (intermittent Epo injections -now q 12 weeks) Fall with Distal Femur Fxr May 2021   [name] [name] was last seen back in January 2022 for routine follow-up after her TIA.  She was doing pretty well at that time.  No further TIA symptoms.  Was having some dyspnea because of anemia, but that was improving.  She was eating healthier and trying to be more active since her TIA.  Was still getting some iron infusions and transfusions at the time.  Some fatigue associated with anemia.  Recent Hospitalizations: none  Reviewed  CV studies:    The following studies were reviewed today: (if available, images/films reviewed: From Epic Chart or Care Everywhere) No recent study:  Interval History:   [name] [name] returns to day - now accompanied by her son (who serves as her "memory").  She still notes DOE and thinks that it is stable.  However, her son indicates that it seems to be more prominent & limiting her activity level.  She has never had any chest tightness or pressure with rest or exertion.  She says that her anemia level is stabilized out and she is getting to the point where she is hoping that be able to make it to every 12 weeks for her "Epo" injections.  Hemoglobin levels are staying stable above 10, which would mean that it is unlikely any exacerbation of dyspnea reviewed related to anemia.  CV Review of Symptoms (Summary) Cardiovascular ROS: positive for - dyspnea on exertion and mild associated fatigue/tiredness.  Nothing at rest. negative for - chest pain, edema,  irregular heartbeat, orthopnea, palpitations, paroxysmal nocturnal dyspnea, rapid heart rate, shortness of breath, or she may get lightheaded and dizzy on occasion, but no syncope or near syncope TIA/amaurosis fugax or claudication.  Seems to be extra motivated for a more prolonged life after the birth of her new great grand child earlier this year.  Pending Dermatology procedure/surgery on 9/20 --> OK to hold Plavix 5-7 d pre-op (would be after this weekend.  REVIEWED OF SYSTEMS   Review of Systems  Constitutional:  Positive for malaise/fatigue (Still has generally low energy, but seems to be stabilizing.  Trying to stay active.). Negative for weight loss.  HENT:  Negative for congestion and nosebleeds.   Respiratory:  Positive for shortness of breath (Only with exertion). Negative for cough and wheezing.   Cardiovascular:  Positive for leg swelling (Trivial, mild). Negative for chest pain.  Gastrointestinal:  Positive for constipation. Negative for abdominal pain, blood in stool and melena.  Genitourinary:  Positive for frequency. Negative for dysuria.  Musculoskeletal:  Positive for joint pain (Chronic OA pain.  Partially  limits activity.). Negative for falls (No recent falls) and myalgias.  Neurological:  Positive for dizziness (Mild vertigo symptoms-but not as much since her hemoglobin levels been stable.). Negative for seizures.  Endo/Heme/Allergies:  Bruises/bleeds easily.  Psychiatric/Behavioral:  Positive for memory loss. Negative for depression. The patient is not nervous/anxious and does not have insomnia.    I have reviewed and (if needed) personally updated the patient's problem list, medications, allergies, past medical and surgical history, social and family history.   PAST MEDICAL HISTORY   Past Medical History:  Diagnosis Date   Anemia    of chronic disease   Anxiety    Arthritis    "psoratic arthritis; new dx" (09/11/2016)   CAD S/P percutaneous coronary angioplasty  08/2016   mLCx 90% -> 0% (2.75 mm x 16 mm) Synergy DES PCI Ostial D1 70% (not PTCA target). Moderate ostial and mD2.  LAD tandem 50% stenoses w/ negative FFR (0.86)   Cancer (HCC)    skin cancer on leg   Chronic lower back pain    Complication of anesthesia 1980 and 1982   trouble waking up after breast biopsy, many surgeries since no problem   Depression    Dyspnea on effort - CHRONIC    partial relief of Sx post PCI of L Circumflex.   Fibrocystic breast    GERD (gastroesophageal reflux disease)    Headache    "monthly" (09/11/2016)   History of blood transfusion    "several since age 54; I've had them after most of my surgeries" (09/11/2016)   History of myocardial infarction     - Myoview [name] 2018 indicated small Inferior - Inferoseptal Infarct (vs. rest ischemia). Echo with inferoseptal hypokinesis.   Hx of skin cancer, basal cell 2013 and 2012   right inner, lower leg; several scattered around my body   Hypertension    Iron deficiency anemia    "I've had an iron infusion"   Osteoarthritis    Osteopenia    Peptic ulcer disease    H pylori   PMR (polymyalgia rheumatica) (HCC)    PONV (postoperative nausea and vomiting)    Psoriasis    TIA (transient ischemic attack) 06/03/2020   Symptoms of right arm weakness/numbness and slurred speech as well as right droopm => MRI brain/MRA head normal.  Normal echo normal carotid Dopplers.    PAST SURGICAL HISTORY   Past Surgical History:  Procedure Laterality Date   APPENDECTOMY     BACK SURGERY     x 6    BREAST BIOPSY Left 1980 and 1982   "benign"   CARDIAC CATHETERIZATION N/A 09/11/2016   Procedure: Left Heart Cath and Coronary Angiography;  Surgeon: Leonie Man, MD;  Location: [place] [facility];  Service: Cardiovascular: mLCx 90% * (PCI). Ostial D1 70% (not PTCA target). Mod ostial and mD2.  LAD tandem 50% stenoses w/ negative FFR (0.86)   CARDIAC CATHETERIZATION N/A 09/11/2016   Procedure: Intravascular Pressure Wire/FFR  Study;  Surgeon: Leonie Man, MD;  Location: [place] [facility];  Service: Cardiovascular: --  LAD tandem 50% stenoses w/ negative FFR (0.86   CARDIAC CATHETERIZATION N/A 09/11/2016   Procedure: Coronary Stent Intervention;  Surgeon: Leonie Man, MD;  Location: [place] [facility];  Service: Cardiovascular: -- DES PCI mLCx 90%--0% SYNERGY DES 2.75 MM X 16 MM.   CARDIAC CATHETERIZATION  2001   non-obstructive CAD   CATARACT EXTRACTION W/ INTRAOCULAR LENS  IMPLANT, BILATERAL  July -  August 2017  DILATION AND CURETTAGE OF UTERUS  X 3   ESOPHAGOGASTRODUODENOSCOPY  03/2009   ulcer,HP, GERD stricture   EXCISIONAL HEMORRHOIDECTOMY     HIP ARTHROPLASTY Left 2010   JOINT REPLACEMENT     KNEE ARTHROSCOPY Bilateral    LAPAROSCOPIC CHOLECYSTECTOMY  2005   LUMBAR [place] SURGERY  11/1984; 1987; 1989; 2004; 2005 X 2   deg. disk lumber spine    MOHS SURGERY Right    "inside my lower leg"   NASAL SINUS SURGERY  1970s   NM MYOVIEW LTD  08/28/2016   INTERMEDIATE RISK (b/c reduced EF & ? small infarct) -- EF ~35-40%.  ? Prior Small Inferior-apical & septal infarct (w/o ischemia).  Difuse HK - worse in inferoapex.  Suggest prior infarct with no ischemia. Ischemic area correlates with echo WMA   ORIF FEMUR FRACTURE Right 01/04/2020   Procedure: OPEN REDUCTION INTERNAL FIXATION (ORIF) DISTAL FEMUR FRACTURE;  Surgeon: Rod Can, MD;  Location: [place];  Service: Orthopedics;  Laterality: Right;   REVERSE SHOULDER ARTHROPLASTY Left 12/17/2017   Procedure: LEFT REVERSE SHOULDER ARTHROPLASTY;  Surgeon: Netta Cedars, MD;  Location: [place];  Service: Orthopedics;  Laterality: Left;   TOTAL HIP ARTHROPLASTY Right 04/04/2013   Procedure: RIGHT TOTAL HIP ARTHROPLASTY ANTERIOR APPROACH;  Surgeon: Mauri Pole, MD;  Location: [facility];  Service: Orthopedics;  Laterality: Right;   TOTAL KNEE ARTHROPLASTY Right 2009   TRANSTHORACIC ECHOCARDIOGRAM  1/'18; 1/'19   a) 1/'18: mild conc LVH. EF 45-50% - anteroseptal &  inferoseptal HK. Mod AI.; b) 1/'19 - post PCI: (post PCI) --> EF normalized:  Normal LV systolic function; mild diastolic dysfunction; mild   LVH; sclerotic aortic valve with mild AI.  No RWMA   TRANSTHORACIC ECHOCARDIOGRAM  06/04/2020    for TIA: Normal LV size and function.  No or WMA.  EF 60 to 65%.  GR 1 DD with elevated LAP.  Normal RV size and function.  Normal RVP, RAP.  Mild aortic valve calcification-sclerosis but no stenosis.  Trivial AI.  No embolic source   TUBAL LIGATION     VAGINAL HYSTERECTOMY  1970s   partial    PCI [name] 2018 -mid LCx 90% - 0% with Synergy DES 2.75 mm right 60 mm; LAD FFR negative.    Immunization History  Administered Date(s) Administered   Fluad Quad(high Dose 65+) 06/30/2019, 05/06/2020   Influenza,inj,Quad PF,6+ Mos 07/17/2013, 09/12/2016, 10/12/2017, 10/25/2018   PFIZER(Purple Top)SARS-COV-2 Vaccination 09/14/2019, 10/05/2019, 08/13/2020   Pneumococcal Conjugate-13 10/12/2017   Pneumococcal Polysaccharide-23 01/26/2008, 04/16/2010   Td 03/28/1998, 01/26/2008    MEDICATIONS/ALLERGIES   Current Meds  Medication Sig   acetaminophen (TYLENOL) 500 MG tablet Take 1,000 mg by mouth 2 (two) times daily as needed for moderate pain or headache.    albuterol (VENTOLIN HFA) 108 (90 Base) MCG/ACT inhaler INHALE 1 PUFF INTO THE LUNGS EVERY 6 (SIX) HOURS AS NEEDED (FOR COUGH).   atorvastatin (LIPITOR) 20 MG tablet TAKE 1 TABLET BY MOUTH EVERY DAY   busPIRone (BUSPAR) 15 MG tablet TAKE 1 TABLET BY MOUTH EVERY DAY   cetirizine (ZYRTEC) 10 MG tablet TAKE 1 TABLET BY MOUTH EVERY DAY   clopidogrel (PLAVIX) 75 MG tablet TAKE 1 TABLET BY MOUTH EVERY DAY WITH BREAKFAST   fluticasone (FLONASE) 50 MCG/ACT nasal spray SPRAY 2 SPRAYS INTO EACH NOSTRIL EVERY DAY (Patient taking differently: Place 1 spray into both nostrils daily as needed.)   HYDROcodone-acetaminophen (NORCO/VICODIN) 5-325 MG tablet Take 1 tablet by mouth 3 (three) times daily as  needed for moderate pain.    losartan (COZAAR) 25 MG tablet Take 1 tablet (25 mg total) by mouth daily.   Melatonin 3 MG CAPS Take 6 mg by mouth at bedtime.    methocarbamol (ROBAXIN) 500 MG tablet TAKE 1 TABLET BY MOUTH EVERY 8 HOURS AS NEEDED FOR MUSCLE SPASM   metoprolol tartrate (LOPRESSOR) 25 MG tablet TAKE 1 TABLET BY MOUTH TWICE A DAY   nystatin (MYCOSTATIN/NYSTOP) powder Apply topically 2 (two) times daily as needed. To affected areas to prevent yeast (in skin folds)   pantoprazole (PROTONIX) 40 MG tablet TAKE 1 TABLET BY MOUTH EVERY DAY   sertraline (ZOLOFT) 25 MG tablet Take 1 tablet (25 mg total) by mouth daily.   Vitamin D, Cholecalciferol, 25 MCG (1000 UT) CAPS Take 2,000 Units by mouth in the morning and at bedtime.     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   Ace Inhibitors     cough   Hydrochlorothiazide     Hyponatremia    Oxaprozin     GI upset    Pregabalin Swelling    SWELLING REACTION UNSPECIFIED    Amoxicillin-Pot Clavulanate Nausea And Vomiting    gi upset Has patient had a PCN reaction causing immediate rash, facial/tongue/throat swelling, SOB or lightheadedness with hypotension: No Has patient had a PCN reaction causing severe rash involving mucus membranes or skin necrosis: No Has patient had a PCN reaction that required hospitalization No Has patient had a PCN reaction occurring within the last 10 years: No If all of the above answers are "NO", then may proceed with Cephalosporin use.     SOCIAL HISTORY/FAMILY HISTORY   Reviewed in Epic:  Pertinent findings:  Social History   Tobacco Use   Smoking status: Never   Smokeless tobacco: Never  Vaping Use   Vaping Use: Never used  Substance Use Topics   Alcohol use: Yes    Alcohol/week: 0.0 standard drinks    Comment: 09/11/2016 "might have a margarita q 3-4 months"   Drug use: No   Social History   Social History Narrative   Now Widowed as of [name] 2020.    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    OBJCTIVE -PE, EKG, labs   Wt Readings from Last 3 Encounters:  05/02/21 182 lb 6.4 oz (82.7 kg)  04/17/21 178 lb 8 oz (81 kg)  11/29/20 177 lb 6 oz (80.5 kg)    Physical Exam: BP 140/82 (BP Location: Left Arm)   Pulse (!) 56   Ht 5' 3.75" (1.619 m)   Wt 182 lb 6.4 oz (82.7 kg)   SpO2 97%   BMI 31.55 kg/m  Physical Exam Constitutional:      General: She is not in acute distress.    Appearance: Normal appearance. She is obese. She is not ill-appearing or toxic-appearing.     Comments: Well-nourished, well-groomed.  Healthy-appearing.  HENT:     Head: Normocephalic and atraumatic.     Ears:     Comments: Very hard of hearing Neck:     Vascular: No carotid bruit.  Cardiovascular:     Rate and Rhythm: Regular rhythm. Bradycardia present.     Pulses: Normal pulses.  Heart sounds: Murmur (1/6 harsh C-D SEM at RUSB.  Distant heart sounds make diastolic murmur less audible.) heard.    No friction rub. No gallop.  Pulmonary:     Effort: Pulmonary effort is normal. No respiratory distress.     Breath sounds: No wheezing, rhonchi or rales (Mild basal crackles but no rales).  Musculoskeletal:        General: No swelling. Normal range of motion.     Cervical back: Normal range of motion and neck supple.  Skin:    General: Skin is warm and dry.     Coloration: Skin is pale (Borderline).  Neurological:     General: No focal deficit present.     Mental Status: She is alert and oriented to person, place, and time.     Motor: No weakness.     Gait: Gait abnormal.  Psychiatric:        Mood and Affect: Mood normal.        Behavior: Behavior normal.        Thought Content: Thought content normal.        Judgment: Judgment normal.     Adult ECG Report  Rate: 56 ;  Rhythm:  sinus bradycardia and Left axis deviation.  Anterior MI, age-indeterminate.  Poor R wave progression with T wave inversions. ;   Narrative Interpretation: Borderline EKG  Recent Labs: Reviewed. Lab Results  Component Value Date   CHOL 159 10/31/2020   HDL 46.70 10/31/2020   LDLCALC 74 10/31/2020   LDLDIRECT 35.0 10/19/2018   TRIG 188.0 (H) 10/31/2020   CHOLHDL 3 10/31/2020   Lab Results  Component Value Date   CREATININE 1.19 (H) 04/17/2021   BUN 23 04/17/2021   NA 139 04/17/2021   K 4.4 04/17/2021   CL 107 04/17/2021   CO2 25 04/17/2021   CBC Latest Ref Rng & Units 04/17/2021 02/13/2021 12/12/2020  WBC 4.0 - 10.5 K/uL 6.2 6.7 6.5  Hemoglobin 12.0 - 15.0 g/dL 10.6(L) 9.7(L) 10.4(L)  Hematocrit 36.0 - 46.0 % 33.5(L) 30.7(L) 32.7(L)  Platelets 150 - 400 K/uL 229 212 248    Lab Results  Component Value Date   HGBA1C 5.3 10/19/2018   Lab Results  Component Value Date   TSH 2.50 10/31/2020    ==================================================  COVID-19 Education: The signs and symptoms of COVID-19 were discussed with the patient and how to seek care for testing (follow up with PCP or arrange E-visit).    I spent a total of 28 minutes with the patient spent in direct patient consultation.  Additional time spent with chart review  / charting (studies, outside notes, etc): 16 min Total Time: 44 min  Current medicines are reviewed at length with the patient today.  (+/- concerns) 0  This visit occurred during the SARS-CoV-2 public health emergency.  Safety protocols were in place, including screening questions prior to the visit, additional usage of staff PPE, and extensive cleaning of exam room while observing appropriate contact time as indicated for disinfecting solutions.  Notice: This dictation was prepared with Dragon dictation along with smaller phrase technology. Any transcriptional errors that result from this process are unintentional and may not be corrected upon  review.  Patient Instructions / Medication Changes & Studies & Tests Ordered   Shared Decision Making/Informed Consent{ The risks [chest pain, shortness of breath, cardiac arrhythmias, dizziness, blood pressure fluctuations, myocardial infarction, stroke/transient ischemic attack, nausea, vomiting, allergic reaction, radiation exposure, metallic taste sensation and life-threatening complications (estimated to be 1  in 10,000)], benefits (risk stratification, diagnosing coronary artery disease, treatment guidance) and alternatives of a nuclear stress test were discussed in detail with Ms. Rushlow and she agrees to proceed.   Patient Instructions  Medication Instructions:   No changes *If you need a refill on your cardiac medications before your next appointment, please call your pharmacy*   Lab Work: Not needed    Testing/Procedures: Will be schedule at [place] has requested that you have a lexiscan myoview.  Follow-Up: At [facility], you and your health needs are our priority.  As part of our continuing mission to provide you with exceptional heart care, we have created designated Provider Care Teams.  These Care Teams include your primary Cardiologist (physician) and Advanced Practice Providers (APPs -  Physician Assistants and Nurse Practitioners) who all work together to provide you with the care you need, when you need it.     Your next appointment:   6 month(s)  The format for your next appointment:   In Person  Provider:   Glenetta Hew, MD   Other Instructions    Studies Ordered:   Orders Placed This Encounter  Procedures   Cardiac Stress Test: Informed Consent Details: Physician/Practitioner Attestation; Transcribe to consent form and obtain patient signature   MYOCARDIAL PERFUSION IMAGING   EKG 12-Lead     Glenetta Hew, M.D., M.S. Interventional Cardiologist   Pager # [phone] Phone # [phone] [address] [place], [place] [address]   Thank you for choosing Heartcare at [facility]!!

## 2021-05-02 NOTE — Patient Instructions (Signed)
Medication Instructions:   No changes *If you need a refill on your cardiac medications before your next appointment, please call your pharmacy*   Lab Work: Not needed    Testing/Procedures: Will be schedule at [place] has requested that you have a lexiscan myoview.  Follow-Up: At [facility], you and your health needs are our priority.  As part of our continuing mission to provide you with exceptional heart care, we have created designated Provider Care Teams.  These Care Teams include your primary Cardiologist (physician) and Advanced Practice Providers (APPs -  Physician Assistants and Nurse Practitioners) who all work together to provide you with the care you need, when you need it.     Your next appointment:   6 month(s)  The format for your next appointment:   In Person  Provider:   Glenetta Hew, MD   Other Instructions

## 2021-05-04 ENCOUNTER — Encounter: Payer: Self-pay | Admitting: Cardiology

## 2021-05-04 NOTE — Assessment & Plan Note (Addendum)
Known CAD with PCI and medical management of moderate LAD disease.  With LCx PCI, we chose to keep her on Plavix monotherapy.  Not on aspirin.

## 2021-05-04 NOTE — Assessment & Plan Note (Signed)
Most recent labs showed LDL of 74.  Pretty close to goal.  With her having exertional dyspnea and fatigue I am leery of pushing too hard.  For now continue current dose of statin.  Would like to see LDL less than 70 however.  If still not at goal at next check, would consider switching to rosuvastatin at same dose.

## 2021-05-04 NOTE — Assessment & Plan Note (Addendum)
Over 4 and half years out from LCx PCI.  Moderate LAD disease.  Known CAD with PCI and medical management of moderate LAD disease.  With LCx PCI, we chose to keep her on Plavix monotherapy.  Not on aspirin.  Okay to hold Plavix 5 to 7 days preop for surgeries or procedures.  Also okay to hold for upcoming dermatology procedure.

## 2021-05-04 NOTE — Assessment & Plan Note (Signed)
Still doing iron supplementation along with erythropoietin/Drawbrdige bleeding.  Maintaining stable hemoglobin levels.

## 2021-05-04 NOTE — Assessment & Plan Note (Signed)
Blood pressure borderline today on low-dose losartan and metoprolol.  I am leery of pushing blood pressure medications too far because he has had some lower blood pressures in the past.  However continue to monitor may be consider increasing losartan to 50 mg

## 2021-05-04 NOTE — Assessment & Plan Note (Signed)
Hemoglobin level still looks well controlled.  Continue to monitor.  Still getting "Epo injections "

## 2021-05-04 NOTE — Assessment & Plan Note (Signed)
Seems to be relatively asymptomatic.  He has not had a change in EF the echo fast.  Continue to monitor.  Would make treadmill stress test difficult to interpret therefore we are going with Myoview.

## 2021-05-04 NOTE — Assessment & Plan Note (Signed)
Exertional dyspnea seems worse now per son's report.  She states things be stable.  However since she is having ongoing symptoms, we will evaluate with Myoview Stress Test.

## 2021-05-08 ENCOUNTER — Other Ambulatory Visit: Payer: Self-pay | Admitting: Family Medicine

## 2021-05-08 DIAGNOSIS — J302 Other seasonal allergic rhinitis: Secondary | ICD-10-CM

## 2021-05-20 ENCOUNTER — Telehealth (HOSPITAL_COMMUNITY): Payer: Self-pay | Admitting: *Deleted

## 2021-05-20 NOTE — Telephone Encounter (Signed)
Left message on voicemail per DPR in reference to upcoming appointment scheduled on 05/26/21 at 10:30 with detailed instructions given per Myocardial Perfusion Study Information Sheet for the test. LM to arrive 15 minutes early, and that it is imperative to arrive on time for appointment to keep from having the test rescheduled. If you need to cancel or reschedule your appointment, please call the office within 24 hours of your appointment. Failure to do so may result in a cancellation of your appointment, and a $50 no show fee. Phone number given for call back for any questions.

## 2021-05-26 ENCOUNTER — Other Ambulatory Visit: Payer: Self-pay

## 2021-05-26 ENCOUNTER — Ambulatory Visit (HOSPITAL_COMMUNITY): Payer: Medicare Other | Attending: Internal Medicine

## 2021-05-26 DIAGNOSIS — R0609 Other forms of dyspnea: Secondary | ICD-10-CM | POA: Insufficient documentation

## 2021-05-26 DIAGNOSIS — I25119 Atherosclerotic heart disease of native coronary artery with unspecified angina pectoris: Secondary | ICD-10-CM | POA: Diagnosis present

## 2021-05-26 LAB — MYOCARDIAL PERFUSION IMAGING
LV dias vol: 28 mL (ref 46–106)
LV sys vol: 73 mL
Nuc Stress EF: 61 %
Peak HR: 91 {beats}/min
Rest HR: 58 {beats}/min
Rest Nuclear Isotope Dose: 11 mCi
SDS: 1
SRS: 0
SSS: 1
ST Depression (mm): 0 mm
Stress Nuclear Isotope Dose: 31.1 mCi
TID: 0.92

## 2021-05-26 MED ORDER — TECHNETIUM TC 99M TETROFOSMIN IV KIT
11.0000 | PACK | Freq: Once | INTRAVENOUS | Status: AC | PRN
  Administered 2021-05-26: 11 via INTRAVENOUS
  Filled 2021-05-26: qty 11

## 2021-05-26 MED ORDER — TECHNETIUM TC 99M TETROFOSMIN IV KIT
31.1000 | PACK | Freq: Once | INTRAVENOUS | Status: AC | PRN
  Administered 2021-05-26: 31.1 via INTRAVENOUS
  Filled 2021-05-26: qty 32

## 2021-05-26 MED ORDER — REGADENOSON 0.4 MG/5ML IV SOLN
0.4000 mg | Freq: Once | INTRAVENOUS | Status: AC
  Administered 2021-05-26: 0.4 mg via INTRAVENOUS

## 2021-05-27 HISTORY — PX: NM MYOVIEW LTD: HXRAD82

## 2021-05-29 ENCOUNTER — Other Ambulatory Visit: Payer: Self-pay | Admitting: Family Medicine

## 2021-05-30 NOTE — Telephone Encounter (Signed)
Last filled on 03/06/21 #30 tabs with 3 refills, CPE was on 11/29/20

## 2021-06-04 ENCOUNTER — Telehealth: Payer: Self-pay | Admitting: Family Medicine

## 2021-06-04 NOTE — Telephone Encounter (Signed)
Pt called in stating that she wants to see if great grandson can be seen by provider as a new pt. Pt great grandson who is 21 was at [facility] and the provider left the practice. Pt great grandson currently has sore throat, drainage and coughing alot

## 2021-06-04 NOTE — Telephone Encounter (Signed)
Not taking new patients right now

## 2021-06-10 ENCOUNTER — Telehealth: Payer: Self-pay | Admitting: Cardiology

## 2021-06-10 NOTE — Telephone Encounter (Signed)
Called and gave results.  Patient verbalized understanding,.

## 2021-06-10 NOTE — Telephone Encounter (Signed)
Pt is calling regarding her results from a most recent Myocardial Perfusion on 05/26/21. Please advise pt further

## 2021-07-21 NOTE — Progress Notes (Signed)
Patient Care Team: Tower, [name], MD as PCP - General Ellyn Hack Leonie Green, MD as PCP - Cardiology (Cardiology) Suella Broad, MD as Consulting Physician (Physical Medicine and Rehabilitation) Gavin Pound, MD as Consulting Physician (Rheumatology) Haverstock, Jennefer Bravo, MD as Referring Physician (Dermatology) Nicholas Lose, MD as Consulting Physician (Hematology and Oncology)  DIAGNOSIS:    ICD-10-CM   1. Anemia of chronic disease  D63.8       CHIEF COMPLIANT: Follow-up of anemia  INTERVAL HISTORY: [name] [name] is a 80 y.o. with above-mentioned history of anemia who currently receives Retacrit. She presents to the clinic today for follow-up and Retacrit.  She is able to move around with the help of a cane.  Overall she has been fairly stable and her energy levels.  ALLERGIES:  is allergic to sulfonamide derivatives, ace inhibitors, hydrochlorothiazide, oxaprozin, pregabalin, and amoxicillin-pot clavulanate.  MEDICATIONS:  Current Outpatient Medications  Medication Sig Dispense Refill   acetaminophen (TYLENOL) 500 MG tablet Take 1,000 mg by mouth 2 (two) times daily as needed for moderate pain or headache.      albuterol (VENTOLIN HFA) 108 (90 Base) MCG/ACT inhaler INHALE 1 PUFF INTO THE LUNGS EVERY 6 (SIX) HOURS AS NEEDED (FOR COUGH). 6.7 each 1   atorvastatin (LIPITOR) 20 MG tablet TAKE 1 TABLET BY MOUTH EVERY DAY 90 tablet 2   busPIRone (BUSPAR) 15 MG tablet TAKE 1 TABLET BY MOUTH EVERY DAY 90 tablet 1   cetirizine (ZYRTEC) 10 MG tablet TAKE 1 TABLET BY MOUTH EVERY DAY 90 tablet 2   clopidogrel (PLAVIX) 75 MG tablet TAKE 1 TABLET BY MOUTH EVERY DAY WITH BREAKFAST 90 tablet 2   fluticasone (FLONASE) 50 MCG/ACT nasal spray SPRAY 2 SPRAYS INTO EACH NOSTRIL EVERY DAY (Patient taking differently: Place 1 spray into both nostrils daily as needed.) 48 mL 1   HYDROcodone-acetaminophen (NORCO/VICODIN) 5-325 MG tablet Take 1 tablet by mouth 3 (three) times daily as needed for moderate  pain. 30 tablet 0   losartan (COZAAR) 25 MG tablet TAKE 1 TABLET BY MOUTH EVERY DAY 90 tablet 2   Melatonin 3 MG CAPS Take 6 mg by mouth at bedtime.      methocarbamol (ROBAXIN) 500 MG tablet TAKE 1 TABLET BY MOUTH EVERY 8 HOURS AS NEEDED FOR MUSCLE SPASMS 90 tablet 1   metoprolol tartrate (LOPRESSOR) 25 MG tablet TAKE 1 TABLET BY MOUTH TWICE A DAY 180 tablet 1   nystatin (MYCOSTATIN/NYSTOP) powder Apply topically 2 (two) times daily as needed. To affected areas to prevent yeast (in skin folds) 30 g 1   pantoprazole (PROTONIX) 40 MG tablet TAKE 1 TABLET BY MOUTH EVERY DAY 90 tablet 2   sertraline (ZOLOFT) 25 MG tablet Take 1 tablet (25 mg total) by mouth daily. 90 tablet 3   Vitamin D, Cholecalciferol, 25 MCG (1000 UT) CAPS Take 2,000 Units by mouth in the morning and at bedtime.      No current facility-administered medications for this visit.    PHYSICAL EXAMINATION: ECOG PERFORMANCE STATUS: 1 - Symptomatic but completely ambulatory  Vitals:   07/22/21 1346  BP: (!) 164/65  Pulse: 65  Resp: 18  Temp: 97.8 F (36.6 C)  SpO2: 98%   Filed Weights   07/22/21 1346  Weight: 179 lb 14.4 oz (81.6 kg)    LABORATORY DATA:  I have reviewed the data as listed CMP Latest Ref Rng & Units 04/17/2021 02/13/2021 12/12/2020  Glucose 70 - 99 mg/dL 118(H) 91 75  BUN 8 - 23  mg/dL 23 29(H) 18  Creatinine 0.44 - 1.00 mg/dL 1.19(H) 1.13(H) 1.10(H)  Sodium 135 - 145 mmol/L 139 139 142  Potassium 3.5 - 5.1 mmol/L 4.4 4.5 4.4  Chloride 98 - 111 mmol/L 107 106 108  CO2 22 - 32 mmol/L 25 24 24   Calcium 8.9 - 10.3 mg/dL 9.2 8.9 8.8(L)  Total Protein 6.5 - 8.1 g/dL 7.2 7.1 7.1  Total Bilirubin 0.3 - 1.2 mg/dL 0.4 0.5 0.3  Alkaline Phos 38 - 126 U/L 69 68 73  AST 15 - 41 U/L 16 17 21   ALT 0 - 44 U/L 11 9 12     Lab Results  Component Value Date   WBC 7.5 07/22/2021   HGB 9.9 (L) 07/22/2021   HCT 30.4 (L) 07/22/2021   MCV 89.4 07/22/2021   PLT 242 07/22/2021   NEUTROABS 5.0 07/22/2021     ASSESSMENT & PLAN:  Anemia of chronic disease 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    Lab review  10/12/2017: Hemoglobin 10.2, MCV 94, normal WBC and platelet counts, creatinine 1.02 04/13/2019: Hemoglobin 9 08/24/2019: Hemoglobin 10.2 02/08/2020: hemoglobin 10.2 03/08/20: Hemoglobin 9.9 05/02/2020: Hemoglobin 10.2 06/03/20: Hb 11.2 (did not receive Retacrit) 06/27/2020: Hemoglobin 10.3 (receiving Retacrit injection) 10/15/2020: Hemoglobin 10.2 (receiving Retacrit) 04/17/2021: Hemoglobin 10.6 (receiving Retacrit) now planning to do the injection every 3 months  07/21/21: hemoglobin: 9.9 (receiving Retacrit)    Current treatment: Retacrit 40,000 units currently receiving it every 12 weeks. (Inj if Hb is less than 11)    Return to clinic in 3 months with labs   No orders of the defined types were placed in this encounter.  The patient has a good understanding of the overall plan. she agrees with it. she will call with any problems that may develop before the next visit here.  Total time spent: 20 mins including face to face time and time spent for planning, charting and coordination of care  Rulon Eisenmenger, MD, MPH 07/22/2021  I, Thana Ates, am acting as scribe for Dr. Nicholas Lose.  I have reviewed the above documentation for accuracy and completeness, and I agree with the above.

## 2021-07-22 ENCOUNTER — Inpatient Hospital Stay: Payer: Medicare Other

## 2021-07-22 ENCOUNTER — Other Ambulatory Visit: Payer: Self-pay

## 2021-07-22 ENCOUNTER — Inpatient Hospital Stay: Payer: Medicare Other | Attending: Hematology and Oncology

## 2021-07-22 ENCOUNTER — Inpatient Hospital Stay: Payer: Medicare Other | Admitting: Hematology and Oncology

## 2021-07-22 DIAGNOSIS — D638 Anemia in other chronic diseases classified elsewhere: Secondary | ICD-10-CM

## 2021-07-22 DIAGNOSIS — Z882 Allergy status to sulfonamides status: Secondary | ICD-10-CM | POA: Diagnosis not present

## 2021-07-22 DIAGNOSIS — D631 Anemia in chronic kidney disease: Secondary | ICD-10-CM | POA: Diagnosis not present

## 2021-07-22 DIAGNOSIS — Z79899 Other long term (current) drug therapy: Secondary | ICD-10-CM | POA: Diagnosis not present

## 2021-07-22 DIAGNOSIS — M353 Polymyalgia rheumatica: Secondary | ICD-10-CM | POA: Diagnosis present

## 2021-07-22 DIAGNOSIS — N189 Chronic kidney disease, unspecified: Secondary | ICD-10-CM

## 2021-07-22 DIAGNOSIS — I129 Hypertensive chronic kidney disease with stage 1 through stage 4 chronic kidney disease, or unspecified chronic kidney disease: Secondary | ICD-10-CM

## 2021-07-22 DIAGNOSIS — Z880 Allergy status to penicillin: Secondary | ICD-10-CM | POA: Diagnosis not present

## 2021-07-22 LAB — CBC WITH DIFFERENTIAL (CANCER CENTER ONLY)
Abs Immature Granulocytes: 0.03 10*3/uL (ref 0.00–0.07)
Basophils Absolute: 0 10*3/uL (ref 0.0–0.1)
Basophils Relative: 1 %
Eosinophils Absolute: 0.1 10*3/uL (ref 0.0–0.5)
Eosinophils Relative: 1 %
HCT: 30.4 % — ABNORMAL LOW (ref 36.0–46.0)
Hemoglobin: 9.9 g/dL — ABNORMAL LOW (ref 12.0–15.0)
Immature Granulocytes: 0 %
Lymphocytes Relative: 24 %
Lymphs Abs: 1.8 10*3/uL (ref 0.7–4.0)
MCH: 29.1 pg (ref 26.0–34.0)
MCHC: 32.6 g/dL (ref 30.0–36.0)
MCV: 89.4 fL (ref 80.0–100.0)
Monocytes Absolute: 0.5 10*3/uL (ref 0.1–1.0)
Monocytes Relative: 7 %
Neutro Abs: 5 10*3/uL (ref 1.7–7.7)
Neutrophils Relative %: 67 %
Platelet Count: 242 10*3/uL (ref 150–400)
RBC: 3.4 MIL/uL — ABNORMAL LOW (ref 3.87–5.11)
RDW: 13.4 % (ref 11.5–15.5)
WBC Count: 7.5 10*3/uL (ref 4.0–10.5)
nRBC: 0 % (ref 0.0–0.2)

## 2021-07-22 MED ORDER — EPOETIN ALFA-EPBX 40000 UNIT/ML IJ SOLN
40000.0000 [IU] | Freq: Once | INTRAMUSCULAR | Status: AC
  Administered 2021-07-22: 40000 [IU] via SUBCUTANEOUS

## 2021-07-22 NOTE — Assessment & Plan Note (Signed)
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  Lab review 10/12/2017: Hemoglobin 10.2, MCV 94, normal WBC and platelet counts, creatinine 1.02 04/13/2019:Hemoglobin 9 08/24/2019:Hemoglobin 10.2 02/08/2020: hemoglobin 10.2 03/08/20: Hemoglobin9.9 05/02/2020: Hemoglobin 10.2 06/03/20: Hb 11.2(did not receive Retacrit) 06/27/2020: Hemoglobin 10.3 (receiving Retacrit injection) 10/15/2020: Hemoglobin 10.2 (receiving Retacrit) 04/17/2021: Hemoglobin 10.6 (receiving Retacrit) now planning to do the injection every 3 months  07/21/21: hemoglobin:    Current treatment:Retacrit 40,000 unitscurrentlyreceiving itevery12weeks.(Inj if Hb is less than 11)   We will see her back in 3 months with labs

## 2021-07-29 ENCOUNTER — Other Ambulatory Visit: Payer: Self-pay | Admitting: Family Medicine

## 2021-07-29 ENCOUNTER — Other Ambulatory Visit: Payer: Self-pay | Admitting: Cardiology

## 2021-07-29 NOTE — Telephone Encounter (Signed)
Last filled on 05/30/21 #90 tabs with 1 refill, last OV was CPE on 11/29/20, please advise

## 2021-09-01 ENCOUNTER — Other Ambulatory Visit: Payer: Self-pay | Admitting: Cardiology

## 2021-09-30 ENCOUNTER — Other Ambulatory Visit: Payer: Self-pay | Admitting: Family Medicine

## 2021-10-03 ENCOUNTER — Telehealth (INDEPENDENT_AMBULATORY_CARE_PROVIDER_SITE_OTHER): Payer: Medicare Other | Admitting: Nurse Practitioner

## 2021-10-03 ENCOUNTER — Encounter: Payer: Self-pay | Admitting: Nurse Practitioner

## 2021-10-03 ENCOUNTER — Other Ambulatory Visit: Payer: Self-pay

## 2021-10-03 DIAGNOSIS — R051 Acute cough: Secondary | ICD-10-CM

## 2021-10-03 DIAGNOSIS — J0100 Acute maxillary sinusitis, unspecified: Secondary | ICD-10-CM | POA: Diagnosis not present

## 2021-10-03 MED ORDER — DOXYCYCLINE HYCLATE 100 MG PO TABS: 100.0000 mg | ORAL_TABLET | Freq: Two times a day (BID) | ORAL | 0 refills | Status: DC

## 2021-10-03 MED ORDER — BENZONATATE 100 MG PO CAPS: 100.0000 mg | ORAL_CAPSULE | Freq: Three times a day (TID) | ORAL | 0 refills | Status: AC | PRN

## 2021-10-03 NOTE — Progress Notes (Signed)
Patient ID: [name] [name], female    DOB: 07/19/1941, 81 y.o.   MRN: [id number]  Virtual visit completed through [employer], a video enabled telemedicine application. Due to national recommendations of social distancing due to COVID-19, a virtual visit is felt to be most appropriate for this patient at this time. Reviewed limitations, risks, security and privacy concerns of performing a virtual visit and the availability of in person appointments. I also reviewed that there may be a patient responsible charge related to this service. The patient agreed to proceed.   Patient location: home Provider location: [place] at [facility], office Persons participating in this virtual visit: patient, provider   If any vitals were documented, they were collected by patient at home unless specified below.    There were no vitals taken for this visit.   CC: Cough/ Sinus issues Subjective:   HPI: [name] [name] is a 81 y.o. female presenting on 10/03/2021 for Cough (Sx started about a little over a week ago. Runny nose, cough is getting worse-coughing up green phlegm, some headaches. No fever. Has been using Flonase spray and inhaler, and some left over Benzonatate tablets she had form Dr Glori Bickers in the past. Covid test negative at home on 10/01/21.)  Symptoms started approx 1 week ago ( last Friday) Vaccinated against covid x2 and one booster Covid test two days ago that was negative  States her daughter is tutoring at the school 5 days a week  Has been using the albuterol and Flonase with some relief. She is curious if she can get some tessalon pearls since they have helped   Relevant past medical, surgical, family and social history reviewed and updated as indicated. Interim medical history since our last visit reviewed. Allergies and medications reviewed and updated. Outpatient Medications Prior to Visit  Medication Sig Dispense Refill   acetaminophen (TYLENOL) 500 MG tablet Take 1,000 mg by  mouth 2 (two) times daily as needed for moderate pain or headache.      albuterol (VENTOLIN HFA) 108 (90 Base) MCG/ACT inhaler INHALE 1 PUFF INTO THE LUNGS EVERY 6 (SIX) HOURS AS NEEDED (FOR COUGH). 6.7 each 1   atorvastatin (LIPITOR) 20 MG tablet TAKE 1 TABLET BY MOUTH EVERY DAY 90 tablet 2   busPIRone (BUSPAR) 15 MG tablet TAKE 1 TABLET BY MOUTH EVERY DAY 90 tablet 1   cetirizine (ZYRTEC) 10 MG tablet TAKE 1 TABLET BY MOUTH EVERY DAY 90 tablet 2   clopidogrel (PLAVIX) 75 MG tablet TAKE 1 TABLET BY MOUTH EVERY DAY WITH BREAKFAST 90 tablet 2   fluticasone (FLONASE) 50 MCG/ACT nasal spray Place 1 spray into both nostrils daily as needed. 48 mL 1   HYDROcodone-acetaminophen (NORCO/VICODIN) 5-325 MG tablet Take 1 tablet by mouth 3 (three) times daily as needed for moderate pain. 30 tablet 0   losartan (COZAAR) 25 MG tablet TAKE 1 TABLET BY MOUTH EVERY DAY 90 tablet 2   Melatonin 3 MG CAPS Take 6 mg by mouth at bedtime.      methocarbamol (ROBAXIN) 500 MG tablet TAKE 1 TABLET BY MOUTH EVERY 8 HOURS AS NEEDED FOR MUSCLE SPASM 90 tablet 2   metoprolol tartrate (LOPRESSOR) 25 MG tablet TAKE 1 TABLET BY MOUTH TWICE A DAY 180 tablet 1   nystatin (MYCOSTATIN/NYSTOP) powder Apply topically 2 (two) times daily as needed. To affected areas to prevent yeast (in skin folds) 30 g 1   pantoprazole (PROTONIX) 40 MG tablet TAKE 1 TABLET BY MOUTH EVERY DAY 90  tablet 2   sertraline (ZOLOFT) 25 MG tablet Take 1 tablet (25 mg total) by mouth daily. 90 tablet 3   Vitamin D, Cholecalciferol, 25 MCG (1000 UT) CAPS Take 2,000 Units by mouth in the morning and at bedtime.      No facility-administered medications prior to visit.     Per HPI unless specifically indicated in ROS section below Review of Systems  Constitutional:  Negative for appetite change, chills, fatigue and fever.  HENT:  Positive for postnasal drip, sinus pressure and sinus pain. Negative for congestion, ear discharge, ear pain and sore throat.    Respiratory:  Positive for cough (green) and shortness of breath (baseline).   Cardiovascular:  Negative for chest pain.  Gastrointestinal:  Negative for abdominal pain, diarrhea, nausea and vomiting.  Musculoskeletal:  Negative for arthralgias and myalgias.  Neurological:  Negative for headaches.  Objective:  There were no vitals taken for this visit.  Wt Readings from Last 3 Encounters:  07/22/21 179 lb 14.4 oz (81.6 kg)  05/26/21 182 lb (82.6 kg)  05/02/21 182 lb 6.4 oz (82.7 kg)       Physical exam: Gen: alert, NAD, not ill appearing Pulm: speaks in complete sentences without increased work of breathing Psych: normal mood, normal thought content      Results for orders placed or performed in visit on 07/22/21  CBC with Differential ([place] Only)  Result Value Ref Range   WBC Count 7.5 4.0 - 10.5 K/uL   RBC 3.40 (L) 3.87 - 5.11 MIL/uL   Hemoglobin 9.9 (L) 12.0 - 15.0 g/dL   HCT 30.4 (L) 36.0 - 46.0 %   MCV 89.4 80.0 - 100.0 fL   MCH 29.1 26.0 - 34.0 pg   MCHC 32.6 30.0 - 36.0 g/dL   RDW 13.4 11.5 - 15.5 %   Platelet Count 242 150 - 400 K/uL   nRBC 0.0 0.0 - 0.2 %   Neutrophils Relative % 67 %   Neutro Abs 5.0 1.7 - 7.7 K/uL   Lymphocytes Relative 24 %   Lymphs Abs 1.8 0.7 - 4.0 K/uL   Monocytes Relative 7 %   Monocytes Absolute 0.5 0.1 - 1.0 K/uL   Eosinophils Relative 1 %   Eosinophils Absolute 0.1 0.0 - 0.5 K/uL   Basophils Relative 1 %   Basophils Absolute 0.0 0.0 - 0.1 K/uL   Immature Granulocytes 0 %   Abs Immature Granulocytes 0.03 0.00 - 0.07 K/uL   Assessment & Plan:   Problem List Items Addressed This Visit       Respiratory   Acute non-recurrent maxillary sinusitis - Primary    Patient's age, comorbidities, and length of symptoms will treat.  Start doxycycline 100 mg twice daily for 7 days.  Did review signs and symptoms when to seek urgent or emergent health care.  Follow-up if no improvement      Relevant Medications   doxycycline  (VIBRA-TABS) 100 MG tablet   benzonatate (TESSALON PERLES) 100 MG capsule     Other   Cough    Patient experiencing cough with her sinus infection.  She has used [employer] in the past with good relief.  Will start Tessalon Perles 100 mg 3 times daily as needed cough.      Relevant Medications   benzonatate (TESSALON PERLES) 100 MG capsule     Meds ordered this encounter  Medications   doxycycline (VIBRA-TABS) 100 MG tablet    Sig: Take 1 tablet (100 mg total)  by mouth 2 (two) times daily.    Dispense:  14 tablet    Refill:  0    Order Specific Question:   Supervising Provider    Answer:   Glori Bickers MARNE A [[id number]]   benzonatate (TESSALON PERLES) 100 MG capsule    Sig: Take 1 capsule (100 mg total) by mouth 3 (three) times daily as needed for up to 7 days for cough.    Dispense:  21 capsule    Refill:  0    Order Specific Question:   Supervising Provider    Answer:   TOWER, MARNE A [[id number]]   No orders of the defined types were placed in this encounter.   I discussed the assessment and treatment plan with the patient. The patient was provided an opportunity to ask questions and all were answered. The patient agreed with the plan and demonstrated an understanding of the instructions. The patient was advised to call back or seek an in-person evaluation if the symptoms worsen or if the condition fails to improve as anticipated.  Follow up plan: No follow-ups on file.  Romilda Garret, NP

## 2021-10-03 NOTE — Assessment & Plan Note (Signed)
Patient experiencing cough with her sinus infection.  She has used [employer] in the past with good relief.  Will start Tessalon Perles 100 mg 3 times daily as needed cough.

## 2021-10-03 NOTE — Assessment & Plan Note (Signed)
Patient's age, comorbidities, and length of symptoms will treat.  Start doxycycline 100 mg twice daily for 7 days.  Did review signs and symptoms when to seek urgent or emergent health care.  Follow-up if no improvement

## 2021-10-21 ENCOUNTER — Inpatient Hospital Stay: Payer: Medicare Other

## 2021-10-22 ENCOUNTER — Other Ambulatory Visit: Payer: Self-pay | Admitting: *Deleted

## 2021-10-22 DIAGNOSIS — D638 Anemia in other chronic diseases classified elsewhere: Secondary | ICD-10-CM

## 2021-10-23 ENCOUNTER — Inpatient Hospital Stay: Payer: Medicare Other | Attending: Hematology and Oncology

## 2021-10-23 ENCOUNTER — Other Ambulatory Visit: Payer: Self-pay

## 2021-10-23 ENCOUNTER — Inpatient Hospital Stay: Payer: Medicare Other

## 2021-10-23 VITALS — BP 138/60 | HR 62 | Temp 98.8°F | Resp 18

## 2021-10-23 DIAGNOSIS — D638 Anemia in other chronic diseases classified elsewhere: Secondary | ICD-10-CM

## 2021-10-23 DIAGNOSIS — M353 Polymyalgia rheumatica: Secondary | ICD-10-CM | POA: Insufficient documentation

## 2021-10-23 LAB — IRON AND IRON BINDING CAPACITY (CC-WL,HP ONLY)
Iron: 59 ug/dL (ref 28–170)
Saturation Ratios: 20 % (ref 10.4–31.8)
TIBC: 302 ug/dL (ref 250–450)
UIBC: 243 ug/dL (ref 148–442)

## 2021-10-23 LAB — CBC WITH DIFFERENTIAL (CANCER CENTER ONLY)
Abs Immature Granulocytes: 0.03 10*3/uL (ref 0.00–0.07)
Basophils Absolute: 0 10*3/uL (ref 0.0–0.1)
Basophils Relative: 1 %
Eosinophils Absolute: 0.2 10*3/uL (ref 0.0–0.5)
Eosinophils Relative: 3 %
HCT: 30.9 % — ABNORMAL LOW (ref 36.0–46.0)
Hemoglobin: 9.9 g/dL — ABNORMAL LOW (ref 12.0–15.0)
Immature Granulocytes: 1 %
Lymphocytes Relative: 28 %
Lymphs Abs: 1.5 10*3/uL (ref 0.7–4.0)
MCH: 28.9 pg (ref 26.0–34.0)
MCHC: 32 g/dL (ref 30.0–36.0)
MCV: 90.4 fL (ref 80.0–100.0)
Monocytes Absolute: 0.4 10*3/uL (ref 0.1–1.0)
Monocytes Relative: 8 %
Neutro Abs: 3.2 10*3/uL (ref 1.7–7.7)
Neutrophils Relative %: 59 %
Platelet Count: 214 10*3/uL (ref 150–400)
RBC: 3.42 MIL/uL — ABNORMAL LOW (ref 3.87–5.11)
RDW: 12.7 % (ref 11.5–15.5)
WBC Count: 5.4 10*3/uL (ref 4.0–10.5)
nRBC: 0 % (ref 0.0–0.2)

## 2021-10-23 LAB — FERRITIN: Ferritin: 98 ng/mL (ref 11–307)

## 2021-10-23 MED ORDER — EPOETIN ALFA-EPBX 40000 UNIT/ML IJ SOLN
40000.0000 [IU] | Freq: Once | INTRAMUSCULAR | Status: AC
  Administered 2021-10-23: 40000 [IU] via SUBCUTANEOUS
  Filled 2021-10-23: qty 1

## 2021-10-23 NOTE — Patient Instructions (Signed)
Epoetin Alfa injection °What is this medication? °EPOETIN ALFA (e POE e tin AL fa) helps your body make more red blood cells. This medicine is used to treat anemia caused by chronic kidney disease, cancer chemotherapy, or HIV-therapy. It may also be used before surgery if you have anemia. °This medicine may be used for other purposes; ask your health care provider or pharmacist if you have questions. °COMMON BRAND NAME(S): Epogen, Procrit, Retacrit °What should I tell my care team before I take this medication? °They need to know if you have any of these conditions: °cancer °heart disease °high blood pressure °history of blood clots °history of stroke °low levels of folate, iron, or vitamin B12 in the blood °seizures °an unusual or allergic reaction to erythropoietin, albumin, benzyl alcohol, hamster proteins, other medicines, foods, dyes, or preservatives °pregnant or trying to get pregnant °breast-feeding °How should I use this medication? °This medicine is for injection into a vein or under the skin. It is usually given by a health care professional in a hospital or clinic setting. °If you get this medicine at home, you will be taught how to prepare and give this medicine. Use exactly as directed. Take your medicine at regular intervals. Do not take your medicine more often than directed. °It is important that you put your used needles and syringes in a special sharps container. Do not put them in a trash can. If you do not have a sharps container, call your pharmacist or healthcare provider to get one. °A special MedGuide will be given to you by the pharmacist with each prescription and refill. Be sure to read this information carefully each time. °Talk to your pediatrician regarding the use of this medicine in children. While this drug may be prescribed for selected conditions, precautions do apply. °Overdosage: If you think you have taken too much of this medicine contact a poison control center or emergency  room at once. °NOTE: This medicine is only for you. Do not share this medicine with others. °What if I miss a dose? °If you miss a dose, take it as soon as you can. If it is almost time for your next dose, take only that dose. Do not take double or extra doses. °What may interact with this medication? °Interactions have not been studied. °This list may not describe all possible interactions. Give your health care provider a list of all the medicines, herbs, non-prescription drugs, or dietary supplements you use. Also tell them if you smoke, drink alcohol, or use illegal drugs. Some items may interact with your medicine. °What should I watch for while using this medication? °Your condition will be monitored carefully while you are receiving this medicine. °You may need blood work done while you are taking this medicine. °This medicine may cause a decrease in vitamin B6. You should make sure that you get enough vitamin B6 while you are taking this medicine. Discuss the foods you eat and the vitamins you take with your health care professional. °What side effects may I notice from receiving this medication? °Side effects that you should report to your doctor or health care professional as soon as possible: °allergic reactions like skin rash, itching or hives, swelling of the face, lips, or tongue °seizures °signs and symptoms of a blood clot such as breathing problems; changes in vision; chest pain; severe, sudden headache; pain, swelling, warmth in the leg; trouble speaking; sudden numbness or weakness of the face, arm or leg °signs and symptoms of a stroke like   changes in vision; confusion; trouble speaking or understanding; severe headaches; sudden numbness or weakness of the face, arm or leg; trouble walking; dizziness; loss of balance or coordination °Side effects that usually do not require medical attention (report to your doctor or health care professional if they continue or are  bothersome): °chills °cough °dizziness °fever °headaches °joint pain °muscle cramps °muscle pain °nausea, vomiting °pain, redness, or irritation at site where injected °This list may not describe all possible side effects. Call your doctor for medical advice about side effects. You may report side effects to FDA at 1-800-FDA-1088. °Where should I keep my medication? °Keep out of the reach of children. °Store in a refrigerator between 2 and 8 degrees C (36 and 46 degrees F). Do not freeze or shake. Throw away any unused portion if using a single-dose vial. Multi-dose vials can be kept in the refrigerator for up to 21 days after the initial dose. Throw away unused medicine. °NOTE: This sheet is a summary. It may not cover all possible information. If you have questions about this medicine, talk to your doctor, pharmacist, or health care provider. °© 2022 [employer] (2017-04-13 00:00:00) ° °

## 2021-10-25 ENCOUNTER — Other Ambulatory Visit: Payer: Self-pay | Admitting: Family Medicine

## 2021-10-27 NOTE — Telephone Encounter (Signed)
Last refilled on 07/29/21 #90 with 2 refills ?LOV 10/03/21 VV with Romilda Garret for sinusitis ?Next appointment on 12/09/21 ?

## 2021-10-28 ENCOUNTER — Other Ambulatory Visit: Payer: Self-pay | Admitting: Nurse Practitioner

## 2021-10-28 DIAGNOSIS — J0100 Acute maxillary sinusitis, unspecified: Secondary | ICD-10-CM

## 2021-11-04 ENCOUNTER — Telehealth: Payer: Self-pay

## 2021-11-04 ENCOUNTER — Other Ambulatory Visit: Payer: Self-pay

## 2021-11-04 ENCOUNTER — Ambulatory Visit: Payer: Medicare Other | Admitting: Family Medicine

## 2021-11-04 ENCOUNTER — Encounter: Payer: Self-pay | Admitting: Family Medicine

## 2021-11-04 ENCOUNTER — Ambulatory Visit (INDEPENDENT_AMBULATORY_CARE_PROVIDER_SITE_OTHER)
Admission: RE | Admit: 2021-11-04 | Discharge: 2021-11-04 | Disposition: A | Discharge status: Home / Self Care | Payer: Medicare Other | Source: Ambulatory Visit | Attending: Family Medicine | Admitting: Family Medicine

## 2021-11-04 VITALS — BP 116/68 | Temp 98.0°F | Ht 64.0 in | Wt 179.0 lb

## 2021-11-04 DIAGNOSIS — J0100 Acute maxillary sinusitis, unspecified: Secondary | ICD-10-CM | POA: Diagnosis not present

## 2021-11-04 DIAGNOSIS — R051 Acute cough: Secondary | ICD-10-CM

## 2021-11-04 MED ORDER — BENZONATATE 200 MG PO CAPS: 200.0000 mg | ORAL_CAPSULE | Freq: Three times a day (TID) | ORAL | 1 refills | Status: DC | PRN

## 2021-11-04 NOTE — Telephone Encounter (Signed)
Called and lvm for patient call back regarding chest x-ray results.  ?

## 2021-11-04 NOTE — Telephone Encounter (Signed)
Patient returned call, relayed message below from Dr. Glori Bickers as written ?

## 2021-11-04 NOTE — Assessment & Plan Note (Signed)
After URI with treated sinusitis ?Suspect postviral cough syndrome ?Reassuring exam today without wheezing or shortness of breath ?Chest x-ray ordered to rule out pneumonia ?Refilled Tessalon today for cough which is worked well thus far ?Discussed symptom control and ER precautions reviewed ?Update if not starting to improve in a week or if worsening  ? ?

## 2021-11-04 NOTE — Patient Instructions (Addendum)
Drink fluids  ?Rest when you need it  ?Chest xray today -we will contact you with results  ? ?Take the tessalon as needed  ?Alert [name] if cough worsens or changes or if not improving ? ?If your symptoms get severe or if you have trouble breathing go to the ER  ? ? ? ?

## 2021-11-04 NOTE — Assessment & Plan Note (Signed)
Sinus symptoms are improved after course of doxycycline ?

## 2021-11-04 NOTE — Progress Notes (Signed)
? ?Subjective:  ? ? Patient ID: [name] [name], female    DOB: 11-29-1940, 81 y.o.   MRN: [id number] ? ?This visit occurred during the SARS-CoV-2 public health emergency.  Safety protocols were in place, including screening questions prior to the visit, additional usage of staff PPE, and extensive cleaning of exam room while observing appropriate contact time as indicated for disinfecting solutions.  ? ?HPI ?Pt presents with cough ? ?Wt Readings from Last 3 Encounters:  ?11/04/21 179 lb (81.2 kg)  ?07/22/21 179 lb 14.4 oz (81.6 kg)  ?05/26/21 182 lb (82.6 kg)  ? ?30.73 kg/m? ? ?Cough ?Over 2 weeks  ?Was seen on 2/10 for sinusitis by NP Cable and took doxycycline bid for 7d ?Needs refill on cough medicine  ? ?Got better and then worse  ? ? ?Some phlegm/not a lot /some green  ?No wheezing  ?No tight chest  ?No fever  ?Some runny nose  ? ?Some allergies /runny nose  ? ?Zyrtec 10 mg  ?Flonase ns  ?Tessalon -helps significantly  ? ? ?Patient Active Problem List  ? Diagnosis Date Noted  ? Acute non-recurrent maxillary sinusitis 10/03/2021  ? Coronary artery disease involving native coronary artery with angina pectoris ([place]) 05/02/2021  ? Current use of proton pump inhibitor 10/29/2020  ? Cough 07/24/2020  ? History of transient ischemic attack (TIA) 06/04/2020  ? Normochromic normocytic anemia   ? History of femur fracture 02/04/2020  ? Renal insufficiency 11/01/2019  ? Pedal edema 10/04/2018  ? Grief reaction 09/17/2018  ? S/P shoulder replacement, left 12/17/2017  ? Routine general medical examination at a health care facility 10/14/2017  ? Preop cardiovascular exam 10/01/2017  ? CAD S/P percutaneous coronary angioplasty 01/27/2017  ? Aortic regurgitation 01/27/2017  ? Dyspnea on exertion 08/19/2016  ? Left bundle branch block (LBBB) on electrocardiogram 08/19/2016  ? Estrogen deficiency 10/08/2015  ? Chronic fatigue 08/20/2015  ? Deficiency anemia 11/17/2013  ? Encounter for Medicare annual wellness exam 07/17/2013  ?  PMR (polymyalgia rheumatica) (HCC) 07/17/2013  ? Intertrigo 04/19/2013  ? Expected blood loss anemia 04/05/2013  ? Obesity (BMI 30-39.9) 04/05/2013  ? S/P right THA, AA 04/04/2013  ? IBS (irritable bowel syndrome) 05/30/2012  ? Anemia of chronic disease 04/16/2010  ? Generalized anxiety disorder 04/16/2010  ? GERD 03/04/2009  ? Vitamin D deficiency 03/12/2008  ? URINARY TRACT INFECTION, RECURRENT 08/07/2007  ? 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  ? FIBROCYSTIC BREAST DISEASE, HX OF 02/11/2007  ? Hyperlipidemia with target low density lipoprotein (LDL) cholesterol less than 70 mg/dL 01/10/2007  ? DEPRESSION 01/10/2007  ? RETINAL VEIN OCCLUSION 01/10/2007  ? Essential hypertension 01/10/2007  ? PEPTIC ULCER DISEASE 01/10/2007  ? Shoulder arthritis 01/10/2007  ? SKIN CANCER, HX OF 01/10/2007  ? ?Past Medical History:  ?Diagnosis Date  ? Anemia   ? of chronic disease  ? Anxiety   ? Arthritis   ? "psoratic arthritis; new dx" (09/11/2016)  ? CAD S/P percutaneous coronary angioplasty 08/2016  ? mLCx 90% -> 0% (2.75 mm x 16 mm) Synergy DES PCI Ostial D1 70% (not PTCA target). Moderate ostial and mD2.  LAD tandem 50% stenoses w/ negative FFR (0.86)  ? Cancer [facility])   ? skin cancer on leg  ? Chronic lower back pain   ? Complication of anesthesia 1980 and 1982  ? trouble waking up after breast biopsy, many surgeries since no problem  ? Depression   ? Dyspnea on effort - CHRONIC   ? partial relief of Sx post  PCI of L Circumflex.  ? Fibrocystic breast   ? GERD (gastroesophageal reflux disease)   ? Headache   ? "monthly" (09/11/2016)  ? History of blood transfusion   ? "several since age 1; I've had them after most of my surgeries" (09/11/2016)  ? History of myocardial infarction   ?  - Myoview [name] 2018 indicated small Inferior - Inferoseptal Infarct (vs. rest ischemia). Echo with inferoseptal hypokinesis.  ? Hx of skin cancer, basal cell 2013 and 2012  ? right inner, lower leg; several scattered around my body  ? Hypertension   ?  Iron deficiency anemia   ? "I've had an iron infusion"  ? Osteoarthritis   ? Osteopenia   ? Peptic ulcer disease   ? H pylori  ? PMR (polymyalgia rheumatica) (HCC)   ? PONV (postoperative nausea and vomiting)   ? Psoriasis   ? TIA (transient ischemic attack) 06/03/2020  ? Symptoms of right arm weakness/numbness and slurred speech as well as right droopm => MRI brain/MRA head normal.  Normal echo normal carotid Dopplers.  ? ?Past Surgical History:  ?Procedure Laterality Date  ? APPENDECTOMY    ? BACK SURGERY    ? x 6   ? BREAST BIOPSY Left 1980 and 1982  ? "benign"  ? CARDIAC CATHETERIZATION N/A 09/11/2016  ? Procedure: Left Heart Cath and Coronary Angiography;  Surgeon: Leonie Man, MD;  Location: [place] [facility];  Service: Cardiovascular: mLCx 90% * (PCI). Ostial D1 70% (not PTCA target). Mod ostial and mD2.  LAD tandem 50% stenoses w/ negative FFR (0.86)  ? CARDIAC CATHETERIZATION N/A 09/11/2016  ? Procedure: Intravascular Pressure Wire/FFR Study;  Surgeon: Leonie Man, MD;  Location: [place] [facility];  Service: Cardiovascular: --  LAD tandem 50% stenoses w/ negative FFR (0.86  ? CARDIAC CATHETERIZATION N/A 09/11/2016  ? Procedure: Coronary Stent Intervention;  Surgeon: Leonie Man, MD;  Location: [place] [facility];  Service: Cardiovascular: -- DES PCI mLCx 90%--0% SYNERGY DES 2.75 MM X 16 MM.  ? CARDIAC CATHETERIZATION  2001  ? non-obstructive CAD  ? CATARACT EXTRACTION W/ INTRAOCULAR LENS  IMPLANT, BILATERAL  July -  August 2017  ? DILATION AND CURETTAGE OF UTERUS  X 3  ? ESOPHAGOGASTRODUODENOSCOPY  03/2009  ? ulcer,HP, GERD stricture  ? EXCISIONAL HEMORRHOIDECTOMY    ? HIP ARTHROPLASTY Left 2010  ? JOINT REPLACEMENT    ? KNEE ARTHROSCOPY Bilateral   ? LAPAROSCOPIC CHOLECYSTECTOMY  2005  ? LUMBAR DISC SURGERY  11/1984; 1987; 1989; 2004; 2005 X 2  ? deg. disk lumber spine   ? MOHS SURGERY Right   ? "inside my lower leg"  ? NASAL SINUS SURGERY  1970s  ? NM MYOVIEW LTD  08/28/2016  ? INTERMEDIATE RISK  (b/c reduced EF & ? small infarct) -- EF ~35-40%.  ? Prior Small Inferior-apical & septal infarct (w/o ischemia).  Difuse HK - worse in inferoapex.  Suggest prior infarct with no ischemia. Ischemic area correlates with echo WMA  ? ORIF FEMUR FRACTURE Right 01/04/2020  ? Procedure: OPEN REDUCTION INTERNAL FIXATION (ORIF) DISTAL FEMUR FRACTURE;  Surgeon: Rod Can, MD;  Location: [place];  Service: Orthopedics;  Laterality: Right;  ? REVERSE SHOULDER ARTHROPLASTY Left 12/17/2017  ? Procedure: LEFT REVERSE SHOULDER ARTHROPLASTY;  Surgeon: Netta Cedars, MD;  Location: [place];  Service: Orthopedics;  Laterality: Left;  ? TOTAL HIP ARTHROPLASTY Right 04/04/2013  ? Procedure: RIGHT TOTAL HIP ARTHROPLASTY ANTERIOR APPROACH;  Surgeon: Mauri Pole, MD;  Location: [facility];  Service: Orthopedics;  Laterality: Right;  ? TOTAL KNEE ARTHROPLASTY Right 2009  ? TRANSTHORACIC ECHOCARDIOGRAM  1/'18; 1/'19  ? a) 1/'18: mild conc LVH. EF 45-50% - anteroseptal & inferoseptal HK. Mod AI.; b) 1/'19 - post PCI: (post PCI) --> EF normalized:  Normal LV systolic function; mild diastolic dysfunction; mild   LVH; sclerotic aortic valve with mild AI.  No RWMA  ? TRANSTHORACIC ECHOCARDIOGRAM  06/04/2020  ?  for TIA: Normal LV size and function.  No or WMA.  EF 60 to 65%.  GR 1 DD with elevated LAP.  Normal RV size and function.  Normal RVP, RAP.  Mild aortic valve calcification-sclerosis but no stenosis.  Trivial AI.  No embolic source  ? TUBAL LIGATION    ? VAGINAL HYSTERECTOMY  1970s  ? partial   ? ?Social History  ? ?Tobacco Use  ? Smoking status: Never  ? Smokeless tobacco: Never  ?Vaping Use  ? Vaping Use: Never used  ?Substance Use Topics  ? Alcohol use: Yes  ?  Alcohol/week: 0.0 standard drinks  ?  Comment: 09/11/2016 "might have a margarita q 3-4 months"  ? Drug use: No  ? ?Family History  ?Problem Relation Age of Onset  ? Arthritis Mother   ? Emphysema Mother   ? Hyperlipidemia Mother   ? Hypertension Mother   ? Heart disease Mother    ? Heart disease Brother   ?     CAD  ? Diabetes Paternal Grandmother   ? Heart disease Maternal Aunt   ? ?Allergies  ?Allergen Reactions  ? Sulfonamide Derivatives Other (See Comments)  ?  crystalize

## 2021-11-04 NOTE — Telephone Encounter (Signed)
-----   Message from Abner Greenspan, MD sent at 11/04/2021 12:49 PM EDT ----- ?If pt does not check mychart ? ? ?Your chest xray looks clear ?Reassuring  ?Please reach out to [name] if your cough does not continue to improve ?

## 2021-11-07 ENCOUNTER — Other Ambulatory Visit: Payer: Self-pay

## 2021-11-07 ENCOUNTER — Encounter: Payer: Self-pay | Admitting: Cardiology

## 2021-11-07 ENCOUNTER — Ambulatory Visit (INDEPENDENT_AMBULATORY_CARE_PROVIDER_SITE_OTHER): Payer: Medicare Other | Admitting: Cardiology

## 2021-11-07 VITALS — BP 128/72 | HR 62 | Ht 63.0 in | Wt 184.0 lb

## 2021-11-07 DIAGNOSIS — I10 Essential (primary) hypertension: Secondary | ICD-10-CM | POA: Diagnosis not present

## 2021-11-07 DIAGNOSIS — I2510 Atherosclerotic heart disease of native coronary artery without angina pectoris: Secondary | ICD-10-CM | POA: Diagnosis not present

## 2021-11-07 DIAGNOSIS — I447 Left bundle-branch block, unspecified: Secondary | ICD-10-CM

## 2021-11-07 DIAGNOSIS — I25119 Atherosclerotic heart disease of native coronary artery with unspecified angina pectoris: Secondary | ICD-10-CM

## 2021-11-07 DIAGNOSIS — Z9861 Coronary angioplasty status: Secondary | ICD-10-CM

## 2021-11-07 DIAGNOSIS — E785 Hyperlipidemia, unspecified: Secondary | ICD-10-CM

## 2021-11-07 MED ORDER — NITROGLYCERIN 0.4 MG SL SUBL: 0.4000 mg | SUBLINGUAL_TABLET | SUBLINGUAL | 3 refills | Status: AC | PRN

## 2021-11-07 NOTE — Progress Notes (Signed)
? ? ?Primary Care Provider: Abner Greenspan, MD ?Cardiologist: Glenetta Hew, MD ?Electrophysiologist: None ? ?Clinic Note: ?Chief Complaint  ?Patient presents with  ? Follow-up  ?  6 months.  Stable exertional dyspnea  ? Coronary Artery Disease  ?  Myoview results.  ? ? ?=================================== ? ?ASSESSMENT/PLAN  ? ?Problem List Items Addressed This Visit   ? ?  ? Cardiology Problems  ? CAD S/P percutaneous coronary angioplasty (Chronic)  ?  Just 5 years out from circumflex PCI. ? ?Remains on Plavix monotherapy.  Likely less GI bleeding concerns on Plavix over aspirin.  Therefore we have maintained Plavix alone. ? ?If she does have bleeding or worsening anemia, I would hold for a week. ? ?Okay to hold Plavix 5 to 7 days preop for surgical procedures. ?  ?  ? Relevant Medications  ? nitroGLYCERIN (NITROSTAT) 0.4 MG SL tablet  ? Hyperlipidemia with target low density lipoprotein (LDL) cholesterol less than 70 mg/dL - Primary (Chronic)  ?  Last LDL was 74.  Would like it to be less than 70 if not close to 50.  She is on atorvastatin 20 mg daily. ?Due for labs to be checked when she sees her PCP. ? ?Low threshold to consider switching to rosuvastatin for more potency with same dose. ? ?  ?  ? Relevant Medications  ? nitroGLYCERIN (NITROSTAT) 0.4 MG SL tablet  ? Essential hypertension (Chronic)  ?  Stable blood pressure on current meds.  Low-dose losartan and Lopressor.  These are mostly on board because of CAD ? ?  ?  ? Relevant Medications  ? nitroGLYCERIN (NITROSTAT) 0.4 MG SL tablet  ? Other Relevant Orders  ? EKG 12-Lead (Completed)  ? Coronary artery disease involving native heart without angina pectoris  ?  Known CAD with LCx PCI and moderate LAD disease. ?Myoview in October 2022 showed no evidence of ischemia.  This would argue that her dyspnea is not related to coronary ischemia or at least macrovascular disease. ? ?There is potential microvascular component.  She is on ARB and beta-blocker along  with statin. ? ?She is on Plavix monotherapy for maintenance-can be held 5 to 7 days preop for surgical procedures... ? ?Refill PRN nitroglycerin.  Low threshold to consider switching from ARB to calcium channel blocker for microvascular disease benefit. ? ?  ?  ? Relevant Medications  ? nitroGLYCERIN (NITROSTAT) 0.4 MG SL tablet  ? Other Relevant Orders  ? EKG 12-Lead (Completed)  ?  ? Other  ? Left bundle branch block (LBBB) on electrocardiogram (Chronic)  ?  Asymptomatic. ?EF stable on nonischemic Myoview ? ?  ?  ? ? ?=================================== ? ?HPI:   ? ?[name] [name] is a 81 y.o. female with a PMH below who presents today for 25-monthfollow-up. ? ?Cardiac History / PMH ?Dec 2017-January 2018: Evaluated for DOE: Echo EF 45-50% Septal/Inferoseptal HK, Mod AI -> Abnormal Myoview EF ~44% with ? Prior infarct & peri-infarct ischemia ?CAD- DES PCI mid LCx (90%). Mod LAD Dz @ small Diag (FFR Negative) ?HTN, HLD,  ?Baseline DOE: CPX January 2020:- Suggested deconditioning.  Recommended PT OT referral. ?TIA 06/03/2020 -> recommended DAPT x3 weeks and then Plavix long-term along with statin.  Also placed on propranolol for GER ?GAD  ?Chronic Anemia (intermittent Epo injections -now q 12 weeks) ?Fall with Distal Femur Fxr May 2021 ? ? ?[name] ADAMIwas last seen on May 02, 2021 (accompanied by his son due to memory issues).  Stable DOE.  Somewhat limiting.  No chest tightness or pressure.  Anemia level stable. ?Ordered Myoview for evaluation of exertional dyspnea.  Plan was to evaluate moderate LAD disease for potential progression. ? ?Recent Hospitalizations: none ? ?Reviewed  CV studies:   ? ?The following studies were reviewed today: (if available, images/films reviewed: From Epic Chart or Care Everywhere) ?Myoview May 27, 2021: Findings consistent with prior anteroapical infarct with fixed defect.  No peri-infarct ischemia.  Notable improvement from prior study.: ? ? ?Interval History:   ? ?[name] [name] presents here today for follow-up overall doing pretty well.  She is stable.  She is still dealing with her anemia issues.  Certainly when she is anemic she gets more short of breath than usual.  But she is now on EPO every 3 months which is a long span for her. ? ?She still has exertional dyspnea, but thinks is pretty stable.  She asked about potentially be on oxygen.  She is not having any chest discomfort or pressure with rest or exertion, although she never really did.  She denies any resting dyspnea.  No PND or orthopnea.  Only has end of day swelling which is on her feet all day But usually this goes down when she puts her feet up.  No irregular heartbeats palpitations. ? ?When she is anemic she can tell because she is profoundly fatigued and notably worse as far as dyspnea. ? ?She tells me that she is moving into her son's house.  Apparently there is been a lot of issues with restlessness today, lots of stress involved.  She is hoping that this can stabilize. ? ?CV Review of Symptoms (Summary) ?Cardiovascular ROS: positive for - dyspnea on exertion and associated with fatigue and mild end of day swelling.  No resting symptoms; occasional lightheadedness or dizziness with some poor balance issues, but not the point of near syncope.. ?negative for - chest pain, irregular heartbeat, orthopnea, palpitations, paroxysmal nocturnal dyspnea, rapid heart rate, shortness of breath, or syncope or near syncope, TIA/amaurosis fugax or claudication. ? ?Still enjoys spending time with her grandchild. ? ? ?REVIEWED OF SYSTEMS  ? ?Review of Systems  ?Constitutional:  Positive for malaise/fatigue. Negative for weight loss.  ?HENT:  Negative for congestion and nosebleeds.   ?Respiratory:  Positive for shortness of breath (Exertional). Negative for cough (Recent URI with bronchitis.  Completed course of antibiotics and Tessalon Perles.  Still has some cough.), hemoptysis and sputum production (No longer since  on antibiotics.).   ?Cardiovascular:   ?     Per HPI  ?Gastrointestinal:  Positive for constipation (Off-and-on-probably because she does not drink enough.). Negative for abdominal pain, blood in stool, diarrhea and melena.  ?Genitourinary:  Positive for frequency (Nocturia).  ?Musculoskeletal:  Positive for joint pain.  ?Neurological:  Positive for dizziness (Per HPI). Negative for loss of consciousness.  ?Endo/Heme/Allergies:  Bruises/bleeds easily.  ?Psychiatric/Behavioral:  Positive for memory loss. The patient is not nervous/anxious and does not have insomnia.   ? ?I have reviewed and (if needed) personally updated the patient's problem list, medications, allergies, past medical and surgical history, social and family history.  ? ?PAST MEDICAL HISTORY  ? ?Past Medical History:  ?Diagnosis Date  ? Anemia   ? of chronic disease  ? Anxiety   ? Arthritis   ? "psoratic arthritis; new dx" (09/11/2016)  ? CAD S/P percutaneous coronary angioplasty 08/2016  ? mLCx 90% -> 0% (2.75 mm x 16 mm) Synergy DES PCI Ostial D1 70% (not PTCA  target). Moderate ostial and mD2.  LAD tandem 50% stenoses w/ negative FFR (0.86)  ? Cancer [facility])   ? skin cancer on leg  ? Chronic lower back pain   ? Complication of anesthesia 1980 and 1982  ? trouble waking up after breast biopsy, many surgeries since no problem  ? Depression   ? Dyspnea on effort - CHRONIC   ? partial relief of Sx post PCI of L Circumflex.  ? Fibrocystic breast   ? GERD (gastroesophageal reflux disease)   ? Headache   ? "monthly" (09/11/2016)  ? History of blood transfusion   ? "several since age 89; I've had them after most of my surgeries" (09/11/2016)  ? History of myocardial infarction   ?  - Myoview [name] 2018 indicated small Inferior - Inferoseptal Infarct (vs. rest ischemia). Echo with inferoseptal hypokinesis.  ? Hx of skin cancer, basal cell 2013 and 2012  ? right inner, lower leg; several scattered around my body  ? Hypertension   ? Iron deficiency anemia   ? "I've  had an iron infusion"  ? Osteoarthritis   ? Osteopenia   ? Peptic ulcer disease   ? H pylori  ? PMR (polymyalgia rheumatica) (HCC)   ? PONV (postoperative nausea and vomiting)   ? Psoriasis   ? TIA (transient

## 2021-11-07 NOTE — Patient Instructions (Signed)
Medication Instructions:  ?Your physician recommends that you continue on your current medications as directed. Please refer to the Current Medication list given to you today. ? ?*If you need a refill on your cardiac medications before your next appointment, please call your pharmacy* ? ?Follow-Up: ?At [facility], you and your health needs are our priority.  As part of our continuing mission to provide you with exceptional heart care, we have created designated Provider Care Teams.  These Care Teams include your primary Cardiologist (physician) and Advanced Practice Providers (APPs -  Physician Assistants and Nurse Practitioners) who all work together to provide you with the care you need, when you need it. ? ?We recommend signing up for the patient portal called "MyChart".  Sign up information is provided on this After Visit Summary.  MyChart is used to connect with patients for Virtual Visits (Telemedicine).  Patients are able to view lab/test results, encounter notes, upcoming appointments, etc.  Non-urgent messages can be sent to your provider as well.   ?To learn more about what you can do with MyChart, go to [email or web address].   ? ?Your next appointment:   ?6 month(s) ? ?The format for your next appointment:   ?In Person ? ?Provider:   ?Glenetta Hew, MD { ? ? ?

## 2021-11-13 LAB — HM MAMMOGRAPHY

## 2021-12-02 ENCOUNTER — Telehealth (INDEPENDENT_AMBULATORY_CARE_PROVIDER_SITE_OTHER): Payer: Medicare Other | Admitting: Family Medicine

## 2021-12-02 ENCOUNTER — Encounter: Payer: Self-pay | Admitting: Family Medicine

## 2021-12-02 ENCOUNTER — Other Ambulatory Visit (INDEPENDENT_AMBULATORY_CARE_PROVIDER_SITE_OTHER): Payer: Medicare Other

## 2021-12-02 DIAGNOSIS — Z79899 Other long term (current) drug therapy: Secondary | ICD-10-CM

## 2021-12-02 DIAGNOSIS — E785 Hyperlipidemia, unspecified: Secondary | ICD-10-CM

## 2021-12-02 DIAGNOSIS — D638 Anemia in other chronic diseases classified elsewhere: Secondary | ICD-10-CM

## 2021-12-02 DIAGNOSIS — I10 Essential (primary) hypertension: Secondary | ICD-10-CM | POA: Diagnosis not present

## 2021-12-02 DIAGNOSIS — E559 Vitamin D deficiency, unspecified: Secondary | ICD-10-CM

## 2021-12-02 LAB — COMPREHENSIVE METABOLIC PANEL
ALT: 15 U/L (ref 0–35)
AST: 19 U/L (ref 0–37)
Albumin: 3.9 g/dL (ref 3.5–5.2)
Alkaline Phosphatase: 67 U/L (ref 39–117)
BUN: 17 mg/dL (ref 6–23)
CO2: 29 mEq/L (ref 19–32)
Calcium: 8.8 mg/dL (ref 8.4–10.5)
Chloride: 107 mEq/L (ref 96–112)
Creatinine, Ser: 0.98 mg/dL (ref 0.40–1.20)
GFR: 53.84 mL/min — ABNORMAL LOW (ref >60.00)
Glucose, Bld: 77 mg/dL (ref 70–99)
Potassium: 4.6 mEq/L (ref 3.5–5.1)
Sodium: 142 mEq/L (ref 135–145)
Total Bilirubin: 0.3 mg/dL (ref 0.2–1.2)
Total Protein: 6.4 g/dL (ref 6.0–8.3)

## 2021-12-02 LAB — LIPID PANEL
Cholesterol: 160 mg/dL (ref 0–200)
HDL: 41.5 mg/dL (ref >39.00)
NonHDL: 118.74
Total CHOL/HDL Ratio: 4
Triglycerides: 212 mg/dL — ABNORMAL HIGH (ref 0.0–149.0)
VLDL: 42.4 mg/dL — ABNORMAL HIGH (ref 0.0–40.0)

## 2021-12-02 LAB — CBC WITH DIFFERENTIAL/PLATELET
Basophils Absolute: 0.1 10*3/uL (ref 0.0–0.1)
Basophils Relative: 1 % (ref 0.0–3.0)
Eosinophils Absolute: 0.2 10*3/uL (ref 0.0–0.7)
Eosinophils Relative: 2.8 % (ref 0.0–5.0)
HCT: 33.2 % — ABNORMAL LOW (ref 36.0–46.0)
Hemoglobin: 10.6 g/dL — ABNORMAL LOW (ref 12.0–15.0)
Lymphocytes Relative: 32.1 % (ref 12.0–46.0)
Lymphs Abs: 2.1 10*3/uL (ref 0.7–4.0)
MCHC: 32 g/dL (ref 30.0–36.0)
MCV: 88.8 fl (ref 78.0–100.0)
Monocytes Absolute: 0.5 10*3/uL (ref 0.1–1.0)
Monocytes Relative: 8 % (ref 3.0–12.0)
Neutro Abs: 3.7 10*3/uL (ref 1.4–7.7)
Neutrophils Relative %: 56.1 % (ref 43.0–77.0)
Platelets: 235 10*3/uL (ref 150.0–400.0)
RBC: 3.75 Mil/uL — ABNORMAL LOW (ref 3.87–5.11)
RDW: 14.2 % (ref 11.5–15.5)
WBC: 6.7 10*3/uL (ref 4.0–10.5)

## 2021-12-02 LAB — LDL CHOLESTEROL, DIRECT: Direct LDL: 75 mg/dL

## 2021-12-02 LAB — VITAMIN D 25 HYDROXY (VIT D DEFICIENCY, FRACTURES): VITD: 39.64 ng/mL (ref 30.00–100.00)

## 2021-12-02 LAB — VITAMIN B12: Vitamin B-12: 399 pg/mL (ref 211–911)

## 2021-12-02 LAB — TSH: TSH: 7.13 u[IU]/mL — ABNORMAL HIGH (ref 0.35–5.50)

## 2021-12-02 NOTE — Telephone Encounter (Signed)
Lab orders

## 2021-12-03 ENCOUNTER — Ambulatory Visit (INDEPENDENT_AMBULATORY_CARE_PROVIDER_SITE_OTHER): Payer: Medicare Other

## 2021-12-03 VITALS — Ht 63.0 in | Wt 179.0 lb

## 2021-12-03 DIAGNOSIS — Z0000 Encounter for general adult medical examination without abnormal findings: Secondary | ICD-10-CM | POA: Diagnosis not present

## 2021-12-03 NOTE — Patient Instructions (Signed)
[name] [name] , ?Thank you for taking time to come for your Medicare Wellness Visit. I appreciate your ongoing commitment to your health goals. Please review the following plan we discussed and let me know if I can assist you in the future.  ? ?Screening recommendations/referrals: ?Colonoscopy: No longer required due to age.  ?Mammogram: Done 11/13/21 Repeat annually ? ?Bone Density: Done 02/09/2018. Repeat every 2 years ?Discuss with Dr. Glori Bickers about repeating. ? ?Recommended yearly ophthalmology/optometry visit for glaucoma screening and checkup ?Recommended yearly dental visit for hygiene and checkup ? ?Vaccinations: ?Influenza vaccine: Done 06/30/2021 Repeat annually ? ?Pneumococcal vaccine: Done 10/12/2017, 04/16/2010 and 01/26/2008. ?Tdap vaccine: Done 01/26/2008 Repeat in 10 years ? ?Shingles vaccine: Discussed.   ?Covid-19:Done 09/14/2019, 10/05/2019, 08/13/2020. ? ?Advanced directives: Please bring a copy of your health care power of attorney and living will to the office to be added to your chart at your convenience. ? ? ?Conditions/risks identified: Aim for 30 minutes of exercise, 6-8 glasses of water, and 5 servings of fruits and vegetables each day. ?KEEP UP THE GOOD WORK!! ? ?Next appointment: Follow up in one year for your annual wellness visit 2024. ? ? ?Preventive Care 38 Years and Older, Female ?Preventive care refers to lifestyle choices and visits with your health care provider that can promote health and wellness. ?What does preventive care include? ?A yearly physical exam. This is also called an annual well check. ?Dental exams once or twice a year. ?Routine eye exams. Ask your health care provider how often you should have your eyes checked. ?Personal lifestyle choices, including: ?Daily care of your teeth and gums. ?Regular physical activity. ?Eating a healthy diet. ?Avoiding tobacco and drug use. ?Limiting alcohol use. ?Practicing safe sex. ?Taking low-dose aspirin every day. ?Taking vitamin and mineral  supplements as recommended by your health care provider. ?What happens during an annual well check? ?The services and screenings done by your health care provider during your annual well check will depend on your age, overall health, lifestyle risk factors, and family history of disease. ?Counseling  ?Your health care provider may ask you questions about your: ?Alcohol use. ?Tobacco use. ?Drug use. ?Emotional well-being. ?Home and relationship well-being. ?Sexual activity. ?Eating habits. ?History of falls. ?Memory and ability to understand (cognition). ?Work and work Statistician. ?Reproductive health. ?Screening  ?You may have the following tests or measurements: ?Height, weight, and BMI. ?Blood pressure. ?Lipid and cholesterol levels. These may be checked every 5 years, or more frequently if you are over 52 years old. ?Skin check. ?Lung cancer screening. You may have this screening every year starting at age 54 if you have a 30-pack-year history of smoking and currently smoke or have quit within the past 15 years. ?Fecal occult blood test (FOBT) of the stool. You may have this test every year starting at age 31. ?Flexible sigmoidoscopy or colonoscopy. You may have a sigmoidoscopy every 5 years or a colonoscopy every 10 years starting at age 3. ?Hepatitis C blood test. ?Hepatitis B blood test. ?Sexually transmitted disease (STD) testing. ?Diabetes screening. This is done by checking your blood sugar (glucose) after you have not eaten for a while (fasting). You may have this done every 1-3 years. ?Bone density scan. This is done to screen for osteoporosis. You may have this done starting at age 78. ?Mammogram. This may be done every 1-2 years. Talk to your health care provider about how often you should have regular mammograms. ?Talk with your health care provider about your test results, treatment options,  and if necessary, the need for more tests. ?Vaccines  ?Your health care provider may recommend certain  vaccines, such as: ?Influenza vaccine. This is recommended every year. ?Tetanus, diphtheria, and acellular pertussis (Tdap, Td) vaccine. You may need a Td booster every 10 years. ?Zoster vaccine. You may need this after age 61. ?Pneumococcal 13-valent conjugate (PCV13) vaccine. One dose is recommended after age 72. ?Pneumococcal polysaccharide (PPSV23) vaccine. One dose is recommended after age 59. ?Talk to your health care provider about which screenings and vaccines you need and how often you need them. ?This information is not intended to replace advice given to you by your health care provider. Make sure you discuss any questions you have with your health care provider. ?Document Released: 09/06/2015 Document Revised: 04/29/2016 Document Reviewed: 06/11/2015 ?Elsevier Interactive Patient Education ? 2017 [place]. ? ?Fall Prevention in the Home ?Falls can cause injuries. They can happen to people of all ages. There are many things you can do to make your home safe and to help prevent falls. ?What can I do on the outside of my home? ?Regularly fix the edges of walkways and driveways and fix any cracks. ?Remove anything that might make you trip as you walk through a door, such as a raised step or threshold. ?Trim any bushes or trees on the path to your home. ?Use bright outdoor lighting. ?Clear any walking paths of anything that might make someone trip, such as rocks or tools. ?Regularly check to see if handrails are loose or broken. Make sure that both sides of any steps have handrails. ?Any raised decks and porches should have guardrails on the edges. ?Have any leaves, snow, or ice cleared regularly. ?Use sand or salt on walking paths during winter. ?Clean up any spills in your garage right away. This includes oil or grease spills. ?What can I do in the bathroom? ?Use night lights. ?Install grab bars by the toilet and in the tub and shower. Do not use towel bars as grab bars. ?Use non-skid mats or decals in  the tub or shower. ?If you need to sit down in the shower, use a plastic, non-slip stool. ?Keep the floor dry. Clean up any water that spills on the floor as soon as it happens. ?Remove soap buildup in the tub or shower regularly. ?Attach bath mats securely with double-sided non-slip rug tape. ?Do not have throw rugs and other things on the floor that can make you trip. ?What can I do in the bedroom? ?Use night lights. ?Make sure that you have a light by your bed that is easy to reach. ?Do not use any sheets or blankets that are too big for your bed. They should not hang down onto the floor. ?Have a firm chair that has side arms. You can use this for support while you get dressed. ?Do not have throw rugs and other things on the floor that can make you trip. ?What can I do in the kitchen? ?Clean up any spills right away. ?Avoid walking on wet floors. ?Keep items that you use a lot in easy-to-reach places. ?If you need to reach something above you, use a strong step stool that has a grab bar. ?Keep electrical cords out of the way. ?Do not use floor polish or wax that makes floors slippery. If you must use wax, use non-skid floor wax. ?Do not have throw rugs and other things on the floor that can make you trip. ?What can I do with my stairs? ?Do not leave  any items on the stairs. ?Make sure that there are handrails on both sides of the stairs and use them. Fix handrails that are broken or loose. Make sure that handrails are as long as the stairways. ?Check any carpeting to make sure that it is firmly attached to the stairs. Fix any carpet that is loose or worn. ?Avoid having throw rugs at the top or bottom of the stairs. If you do have throw rugs, attach them to the floor with carpet tape. ?Make sure that you have a light switch at the top of the stairs and the bottom of the stairs. If you do not have them, ask someone to add them for you. ?What else can I do to help prevent falls? ?Wear shoes that: ?Do not have high  heels. ?Have rubber bottoms. ?Are comfortable and fit you well. ?Are closed at the toe. Do not wear sandals. ?If you use a stepladder: ?Make sure that it is fully opened. Do not climb a closed stepladder. ?Dillard Essex

## 2021-12-03 NOTE — Progress Notes (Signed)
? ?Subjective:  ? [name] [name] is a 81 y.o. female who presents for Medicare Annual (Subsequent) preventive examination. ?Virtual Visit via Telephone Note ? ?I connected with  [name] [name] on 12/03/21 at  9:00 AM EDT by telephone and verified that I am speaking with the correct person using two identifiers. ? ?Location: ?Patient: HOME ?Provider: [facility] ?Persons participating in the virtual visit: patient/Nurse Health Advisor ?  ?I discussed the limitations, risks, security and privacy concerns of performing an evaluation and management service by telephone and the availability of in person appointments. The patient expressed understanding and agreed to proceed. ? ?Interactive audio and video telecommunications were attempted between this nurse and patient, however failed, due to patient having technical difficulties OR patient did not have access to video capability.  We continued and completed visit with audio only. ? ?Some vital signs may be absent or patient reported.  ? ?[name], LPN ? ?Review of Systems    ? ?Cardiac Risk Factors include: advanced age (>24mn, >>51women);dyslipidemia;hypertension;sedentary lifestyle;obesity (BMI >30kg/m2) ? ?   ?Objective:  ?  ?Today's Vitals  ? 12/03/21 0900 12/03/21 0903  ?Weight: 179 lb (81.2 kg)   ?Height: '5\' 3"'$  (1.6 m)   ?PainSc:  6   ? ?Body mass index is 31.71 kg/m?. ? ? ?  12/03/2021  ?  9:21 AM 10/30/2020  ? 12:11 PM 01/04/2020  ? 12:40 PM 01/03/2020  ?  7:54 PM 10/27/2019  ? 11:49 AM 06/29/2019  ? 11:13 AM 06/18/2019  ?  9:42 PM  ?Advanced Directives  ?Does Patient Have a Medical Advance Directive? Yes Yes No No Yes Yes Yes  ?Type of AParamedicof AMcRobertsLiving will HLemontLiving will   HSnoverLiving will HWartraceLiving will HCollege CornerLiving will  ?Copy of [place] Chart? No - copy requested No - copy requested   No - copy  requested No - copy requested   ?Would patient like information on creating a medical advance directive?   No - Patient declined No - Patient declined     ? ? ?Current Medications (verified) ?Outpatient Encounter Medications as of 12/03/2021  ?Medication Sig  ? acetaminophen (TYLENOL) 500 MG tablet Take 1,000 mg by mouth 2 (two) times daily as needed for moderate pain or headache.   ? albuterol (VENTOLIN HFA) 108 (90 Base) MCG/ACT inhaler INHALE 1 PUFF INTO THE LUNGS EVERY 6 (SIX) HOURS AS NEEDED (FOR COUGH).  ? atorvastatin (LIPITOR) 20 MG tablet TAKE 1 TABLET BY MOUTH EVERY DAY  ? benzonatate (TESSALON) 200 MG capsule Take 1 capsule (200 mg total) by mouth 3 (three) times daily as needed for cough.  ? busPIRone (BUSPAR) 15 MG tablet TAKE 1 TABLET BY MOUTH EVERY DAY  ? cetirizine (ZYRTEC) 10 MG tablet TAKE 1 TABLET BY MOUTH EVERY DAY  ? clopidogrel (PLAVIX) 75 MG tablet TAKE 1 TABLET BY MOUTH EVERY DAY WITH BREAKFAST  ? doxycycline (VIBRA-TABS) 100 MG tablet Take 1 tablet (100 mg total) by mouth 2 (two) times daily.  ? fluticasone (FLONASE) 50 MCG/ACT nasal spray Place 1 spray into both nostrils daily as needed.  ? HYDROcodone-acetaminophen (NORCO/VICODIN) 5-325 MG tablet Take 1 tablet by mouth 3 (three) times daily as needed for moderate pain.  ? losartan (COZAAR) 25 MG tablet TAKE 1 TABLET BY MOUTH EVERY DAY  ? Melatonin 3 MG CAPS Take 6 mg by mouth at bedtime.   ?  methocarbamol (ROBAXIN) 500 MG tablet TAKE 1 TABLET BY MOUTH EVERY 8 HOURS AS NEEDED FOR MUSCLE SPASMS  ? metoprolol tartrate (LOPRESSOR) 25 MG tablet TAKE 1 TABLET BY MOUTH TWICE A DAY  ? nitroGLYCERIN (NITROSTAT) 0.4 MG SL tablet Place 1 tablet (0.4 mg total) under the tongue every 5 (five) minutes as needed.  ? nystatin (MYCOSTATIN/NYSTOP) powder Apply topically 2 (two) times daily as needed. To affected areas to prevent yeast (in skin folds)  ? pantoprazole (PROTONIX) 40 MG tablet TAKE 1 TABLET BY MOUTH EVERY DAY  ? sertraline (ZOLOFT) 25 MG tablet  Take 1 tablet (25 mg total) by mouth daily.  ? Vitamin D, Cholecalciferol, 25 MCG (1000 UT) CAPS Take 2,000 Units by mouth in the morning and at bedtime.   ? ?No facility-administered encounter medications on file as of 12/03/2021.  ? ? ?Allergies (verified) ?Sulfonamide derivatives, Ace inhibitors, Hydrochlorothiazide, Oxaprozin, Pregabalin, and Amoxicillin-pot clavulanate  ? ?History: ?Past Medical History:  ?Diagnosis Date  ? Anemia   ? of chronic disease  ? Anxiety   ? Arthritis   ? "psoratic arthritis; new dx" (09/11/2016)  ? CAD S/P percutaneous coronary angioplasty 08/2016  ? mLCx 90% -> 0% (2.75 mm x 16 mm) Synergy DES PCI Ostial D1 70% (not PTCA target). Moderate ostial and mD2.  LAD tandem 50% stenoses w/ negative FFR (0.86)  ? Cancer [facility])   ? skin cancer on leg  ? Chronic lower back pain   ? Complication of anesthesia 1980 and 1982  ? trouble waking up after breast biopsy, many surgeries since no problem  ? Depression   ? Dyspnea on effort - CHRONIC   ? partial relief of Sx post PCI of L Circumflex.  ? Fibrocystic breast   ? GERD (gastroesophageal reflux disease)   ? Headache   ? "monthly" (09/11/2016)  ? History of blood transfusion   ? "several since age 30; I've had them after most of my surgeries" (09/11/2016)  ? History of myocardial infarction   ?  - Myoview [name] 2018 indicated small Inferior - Inferoseptal Infarct (vs. rest ischemia). Echo with inferoseptal hypokinesis.  ? Hx of skin cancer, basal cell 2013 and 2012  ? right inner, lower leg; several scattered around my body  ? Hypertension   ? Iron deficiency anemia   ? "I've had an iron infusion"  ? Osteoarthritis   ? Osteopenia   ? Peptic ulcer disease   ? H pylori  ? PMR (polymyalgia rheumatica) (HCC)   ? PONV (postoperative nausea and vomiting)   ? Psoriasis   ? TIA (transient ischemic attack) 06/03/2020  ? Symptoms of right arm weakness/numbness and slurred speech as well as right droopm => MRI brain/MRA head normal.  Normal echo normal carotid  Dopplers.  ? ?Past Surgical History:  ?Procedure Laterality Date  ? APPENDECTOMY    ? BACK SURGERY    ? x 6   ? BREAST BIOPSY Left 1980 and 1982  ? "benign"  ? CARDIAC CATHETERIZATION N/A 09/11/2016  ? Procedure: Left Heart Cath and Coronary Angiography;  Surgeon: Leonie Man, MD;  Location: [place] [facility];  Service: Cardiovascular: mLCx 90% * (PCI). Ostial D1 70% (not PTCA target). Mod ostial and mD2.  LAD tandem 50% stenoses w/ negative FFR (0.86)  ? CARDIAC CATHETERIZATION N/A 09/11/2016  ? Procedure: Intravascular Pressure Wire/FFR Study;  Surgeon: Leonie Man, MD;  Location: [place] [facility];  Service: Cardiovascular: --  LAD tandem 50% stenoses w/ negative FFR (0.86  ?  CARDIAC CATHETERIZATION N/A 09/11/2016  ? Procedure: Coronary Stent Intervention;  Surgeon: Leonie Man, MD;  Location: [place] [facility];  Service: Cardiovascular: -- DES PCI mLCx 90%--0% SYNERGY DES 2.75 MM X 16 MM.  ? CARDIAC CATHETERIZATION  2001  ? non-obstructive CAD  ? CATARACT EXTRACTION W/ INTRAOCULAR LENS  IMPLANT, BILATERAL  July -  August 2017  ? DILATION AND CURETTAGE OF UTERUS  X 3  ? ESOPHAGOGASTRODUODENOSCOPY  03/2009  ? ulcer,HP, GERD stricture  ? EXCISIONAL HEMORRHOIDECTOMY    ? HIP ARTHROPLASTY Left 2010  ? JOINT REPLACEMENT    ? KNEE ARTHROSCOPY Bilateral   ? LAPAROSCOPIC CHOLECYSTECTOMY  2005  ? LUMBAR DISC SURGERY  11/1984; 1987; 1989; 2004; 2005 X 2  ? deg. disk lumber spine   ? MOHS SURGERY Right   ? "inside my lower leg"  ? NASAL SINUS SURGERY  1970s  ? NM MYOVIEW LTD  08/28/2016  ? INTERMEDIATE RISK (b/c reduced EF & ? small infarct) -- EF ~35-40%.  ? Prior Small Inferior-apical & septal infarct (w/o ischemia).  Difuse HK - worse in inferoapex.  Suggest prior infarct with no ischemia. Ischemic area correlates with echo WMA  ? ORIF FEMUR FRACTURE Right 01/04/2020  ? Procedure: OPEN REDUCTION INTERNAL FIXATION (ORIF) DISTAL FEMUR FRACTURE;  Surgeon: Rod Can, MD;  Location: [place];  Service:  Orthopedics;  Laterality: Right;  ? REVERSE SHOULDER ARTHROPLASTY Left 12/17/2017  ? Procedure: LEFT REVERSE SHOULDER ARTHROPLASTY;  Surgeon: Netta Cedars, MD;  Location: [place];  Service: Orthopedics;  Laterality:

## 2021-12-09 ENCOUNTER — Encounter: Payer: Self-pay | Admitting: Hematology and Oncology

## 2021-12-09 ENCOUNTER — Ambulatory Visit (INDEPENDENT_AMBULATORY_CARE_PROVIDER_SITE_OTHER): Payer: Medicare Other | Admitting: Family Medicine

## 2021-12-09 ENCOUNTER — Other Ambulatory Visit: Payer: Medicare Other

## 2021-12-09 ENCOUNTER — Encounter: Payer: Self-pay | Admitting: Family Medicine

## 2021-12-09 VITALS — BP 128/70 | HR 51 | Temp 97.8°F | Ht 63.0 in | Wt 183.4 lb

## 2021-12-09 DIAGNOSIS — E559 Vitamin D deficiency, unspecified: Secondary | ICD-10-CM

## 2021-12-09 DIAGNOSIS — F411 Generalized anxiety disorder: Secondary | ICD-10-CM

## 2021-12-09 DIAGNOSIS — N289 Disorder of kidney and ureter, unspecified: Secondary | ICD-10-CM | POA: Diagnosis not present

## 2021-12-09 DIAGNOSIS — K219 Gastro-esophageal reflux disease without esophagitis: Secondary | ICD-10-CM

## 2021-12-09 DIAGNOSIS — M8580 Other specified disorders of bone density and structure, unspecified site: Secondary | ICD-10-CM

## 2021-12-09 DIAGNOSIS — I10 Essential (primary) hypertension: Secondary | ICD-10-CM

## 2021-12-09 DIAGNOSIS — T380X5A Adverse effect of glucocorticoids and synthetic analogues, initial encounter: Secondary | ICD-10-CM

## 2021-12-09 DIAGNOSIS — E2839 Other primary ovarian failure: Secondary | ICD-10-CM

## 2021-12-09 DIAGNOSIS — E669 Obesity, unspecified: Secondary | ICD-10-CM

## 2021-12-09 DIAGNOSIS — Z79899 Other long term (current) drug therapy: Secondary | ICD-10-CM

## 2021-12-09 DIAGNOSIS — R051 Acute cough: Secondary | ICD-10-CM

## 2021-12-09 DIAGNOSIS — E785 Hyperlipidemia, unspecified: Secondary | ICD-10-CM

## 2021-12-09 DIAGNOSIS — Z0000 Encounter for general adult medical examination without abnormal findings: Secondary | ICD-10-CM | POA: Diagnosis not present

## 2021-12-09 DIAGNOSIS — R7989 Other specified abnormal findings of blood chemistry: Secondary | ICD-10-CM

## 2021-12-09 DIAGNOSIS — D638 Anemia in other chronic diseases classified elsewhere: Secondary | ICD-10-CM

## 2021-12-09 NOTE — Assessment & Plan Note (Signed)
Level of 39.6 ?Vitamin D level is therapeutic with current supplementation ?Disc importance of this to bone and overall health ?Will continue current supplement ?

## 2021-12-09 NOTE — Assessment & Plan Note (Signed)
Discussed how this problem influences overall health and the risks it imposes  ?Reviewed plan for weight loss with lower calorie diet (via better food choices and also portion control or program like weight watchers) and exercise building up to or more than 30 minutes 5 days per week including some aerobic activity  ? ?Exercise is limited due to balance issues ?

## 2021-12-09 NOTE — Assessment & Plan Note (Signed)
Sees hematology ?Treated for hemoglobin under 10 ?Today is 10.6 ?

## 2021-12-09 NOTE — Progress Notes (Signed)
? ?Subjective:  ? ? Patient ID: [name] [name], female    DOB: 1940/12/30, 81 y.o.   MRN: [id number] ? ?HPI ?Here for health maintenance exam and to review chronic medical problems   ? ?Wt Readings from Last 3 Encounters:  ?12/09/21 183 lb 6 oz (83.2 kg)  ?12/03/21 179 lb (81.2 kg)  ?11/07/21 184 lb (83.5 kg)  ? ?32.48 kg/m? ? ?Feeling ok overall for her age  ?Some stress with family (worries)  ?Lives with daughter but planning to move back into her house with her son  ?No longer drives  ? ?Amw done/reviewed  ? ?Zoster status : interested in shingrix if it is covered  ?Covid vaccine 05/2021 ?Td postponed for ins ?Flu shot 06/2021 ?Pna vaccine utd ? ? ?Mammogram 10/2021 ?Self breast exam: no lumps  ? ? ?Dexa 01/2018  , thinks it  ?D level of 39.6 ?Falls: none ?Fractures P:none recent  ?Supplements  - D ? ? ?Colonoscopy 2010 ?Declines further screening  ? ?Takes ppi for GERD ?Workse well  ?Protonix 40 mg daily  ?Lab Results  ?Component Value Date  ? [id number] 12/02/2021  ? ? ?HTN ?bp is stable today  ?No cp or palpitations or headaches or edema  ?No side effects to medicines  ?BP Readings from Last 3 Encounters:  ?12/09/21 128/70  ?11/07/21 128/72  ?11/04/21 116/68  ?  Losartan 25 mg daily  ?Metoprool 25 mg bid ? ? ?Pulse Readings from Last 3 Encounters:  ?12/09/21 (!) 51  ?11/07/21 62  ?10/23/21 62  ? ? ? ?Renal insuff ?Lab Results  ?Component Value Date  ? CREATININE 0.98 12/02/2021  ? BUN 17 12/02/2021  ? NA 142 12/02/2021  ? K 4.6 12/02/2021  ? CL 107 12/02/2021  ? CO2 29 12/02/2021  ? ?No longer on hctz/improved ? ?TSH is elevated ?Lab Results  ?Component Value Date  ? TSH 7.13 (H) 12/02/2021  ? ? ? ?Anemia of chronic dz ?Sees hematology ?Lab Results  ?Component Value Date  ? WBC 6.7 12/02/2021  ? HGB 10.6 (L) 12/02/2021  ? HCT 33.2 (L) 12/02/2021  ? MCV 88.8 12/02/2021  ? PLT 235.0 12/02/2021  ?Has had retacrit tx  ?Holds it if Hb is over 10  ? ? ?Hyperlipidemia ?Lab Results  ?Component Value Date  ? CHOL 160  12/02/2021  ? CHOL 159 10/31/2020  ? CHOL 136 10/27/2019  ? ?Lab Results  ?Component Value Date  ? HDL 41.50 12/02/2021  ? HDL 46.70 10/31/2020  ? HDL 44.70 10/27/2019  ? ?Lab Results  ?Component Value Date  ? [place] 74 10/31/2020  ? [place] 55 10/27/2019  ? [place] 52 10/27/2016  ? ?Lab Results  ?Component Value Date  ? TRIG 212.0 (H) 12/02/2021  ? TRIG 188.0 (H) 10/31/2020  ? TRIG 182.0 (H) 10/27/2019  ? ?Lab Results  ?Component Value Date  ? CHOLHDL 4 12/02/2021  ? CHOLHDL 3 10/31/2020  ? CHOLHDL 3 10/27/2019  ? ?Lab Results  ?Component Value Date  ? LDLDIRECT 75.0 12/02/2021  ? LDLDIRECT 35.0 10/19/2018  ? LDLDIRECT 74.0 10/12/2017  ? ?Atorvastatin 20 mg daily  ?Overall stable ?LDL of 74 (this varies)  ? ? ?Diet is fairly good- living in daughter's house /they watch diet  ? ?Coughs occasionally /not bad  ?Has tessalon on hand now and then  ? ?Patient Active Problem List  ? Diagnosis Date Noted  ? Elevated TSH 12/09/2021  ? Coronary artery disease involving native coronary artery with angina pectoris ([place]) 05/02/2021  ?  Current use of proton pump inhibitor 10/29/2020  ? Cough 07/24/2020  ? History of transient ischemic attack (TIA) 06/04/2020  ? Normochromic normocytic anemia   ? History of femur fracture 02/04/2020  ? Renal insufficiency 11/01/2019  ? Pedal edema 10/04/2018  ? S/P shoulder replacement, left 12/17/2017  ? S/P reverse total shoulder arthroplasty, left 12/17/2017  ? Routine general medical examination at a health care facility 10/14/2017  ? Preop cardiovascular exam 10/01/2017  ? CAD S/P percutaneous coronary angioplasty 01/27/2017  ? Aortic regurgitation 01/27/2017  ? Dyspnea on exertion 08/19/2016  ? Left bundle branch block (LBBB) on electrocardiogram 08/19/2016  ? Estrogen deficiency 10/08/2015  ? Chronic fatigue 08/20/2015  ? Deficiency anemia 11/17/2013  ? Encounter for Medicare annual wellness exam 07/17/2013  ? PMR (polymyalgia rheumatica) (HCC) 07/17/2013  ? Intertrigo 04/19/2013  ?  Obesity (BMI 30-39.9) 04/05/2013  ? S/P right THA, AA 04/04/2013  ? IBS (irritable bowel syndrome) 05/30/2012  ? Anemia of chronic disease 04/16/2010  ? Generalized anxiety disorder 04/16/2010  ? GERD 03/04/2009  ? Vitamin D deficiency 03/12/2008  ? URINARY TRACT INFECTION, RECURRENT 08/07/2007  ? 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  ? FIBROCYSTIC BREAST DISEASE, HX OF 02/11/2007  ? Hyperlipidemia with target low density lipoprotein (LDL) cholesterol less than 70 mg/dL 01/10/2007  ? DEPRESSION 01/10/2007  ? RETINAL VEIN OCCLUSION 01/10/2007  ? Essential hypertension 01/10/2007  ? PEPTIC ULCER DISEASE 01/10/2007  ? Shoulder arthritis 01/10/2007  ? SKIN CANCER, HX OF 01/10/2007  ? Localized, primary osteoarthritis of shoulder region 01/10/2007  ? ?Past Medical History:  ?Diagnosis Date  ? Anemia   ? of chronic disease  ? Anxiety   ? Arthritis   ? "psoratic arthritis; new dx" (09/11/2016)  ? CAD S/P percutaneous coronary angioplasty 08/2016  ? mLCx 90% -> 0% (2.75 mm x 16 mm) Synergy DES PCI Ostial D1 70% (not PTCA target). Moderate ostial and mD2.  LAD tandem 50% stenoses w/ negative FFR (0.86)  ? Cancer [facility])   ? skin cancer on leg  ? Chronic lower back pain   ? Complication of anesthesia 1980 and 1982  ? trouble waking up after breast biopsy, many surgeries since no problem  ? Depression   ? Dyspnea on effort - CHRONIC   ? partial relief of Sx post PCI of L Circumflex.  ? Fibrocystic breast   ? GERD (gastroesophageal reflux disease)   ? Headache   ? "monthly" (09/11/2016)  ? History of blood transfusion   ? "several since age 10; I've had them after most of my surgeries" (09/11/2016)  ? History of myocardial infarction   ?  - Myoview [name] 2018 indicated small Inferior - Inferoseptal Infarct (vs. rest ischemia). Echo with inferoseptal hypokinesis.  ? Hx of skin cancer, basal cell 2013 and 2012  ? right inner, lower leg; several scattered around my body  ? Hypertension   ? Iron deficiency anemia   ? "I've had an iron  infusion"  ? Osteoarthritis   ? Osteopenia   ? Peptic ulcer disease   ? H pylori  ? PMR (polymyalgia rheumatica) (HCC)   ? PONV (postoperative nausea and vomiting)   ? Psoriasis   ? TIA (transient ischemic attack) 06/03/2020  ? Symptoms of right arm weakness/numbness and slurred speech as well as right droopm => MRI brain/MRA head normal.  Normal echo normal carotid Dopplers.  ? ?Past Surgical History:  ?Procedure Laterality Date  ? APPENDECTOMY    ? BACK SURGERY    ? x 6   ?  BREAST BIOPSY Left 1980 and 1982  ? "benign"  ? CARDIAC CATHETERIZATION N/A 09/11/2016  ? Procedure: Left Heart Cath and Coronary Angiography;  Surgeon: Leonie Man, MD;  Location: [facility];  Service: Cardiovascular: mLCx 90% * (PCI). Ostial D1 70% (not PTCA target). Mod ostial and mD2.  LAD tandem 50% stenoses w/ negative FFR (0.86)  ? CARDIAC CATHETERIZATION N/A 09/11/2016  ? Procedure: Intravascular Pressure Wire/FFR Study;  Surgeon: Leonie Man, MD;  Location: [place] [facility];  Service: Cardiovascular: --  LAD tandem 50% stenoses w/ negative FFR (0.86  ? CARDIAC CATHETERIZATION N/A 09/11/2016  ? Procedure: Coronary Stent Intervention;  Surgeon: Leonie Man, MD;  Location: [place] [facility];  Service: Cardiovascular: -- DES PCI mLCx 90%--0% SYNERGY DES 2.75 MM X 16 MM.  ? CARDIAC CATHETERIZATION  2001  ? non-obstructive CAD  ? CATARACT EXTRACTION W/ INTRAOCULAR LENS  IMPLANT, BILATERAL  July -  August 2017  ? DILATION AND CURETTAGE OF UTERUS  X 3  ? ESOPHAGOGASTRODUODENOSCOPY  03/2009  ? ulcer,HP, GERD stricture  ? EXCISIONAL HEMORRHOIDECTOMY    ? HIP ARTHROPLASTY Left 2010  ? JOINT REPLACEMENT    ? KNEE ARTHROSCOPY Bilateral   ? LAPAROSCOPIC CHOLECYSTECTOMY  2005  ? LUMBAR DISC SURGERY  11/1984; 1987; 1989; 2004; 2005 X 2  ? deg. disk lumber spine   ? MOHS SURGERY Right   ? "inside my lower leg"  ? NASAL SINUS SURGERY  1970s  ? NM MYOVIEW LTD  08/28/2016  ? INTERMEDIATE RISK (b/c reduced EF & ? small infarct) -- EF  ~35-40%.  ? Prior Small Inferior-apical & septal infarct (w/o ischemia).  Difuse HK - worse in inferoapex.  Suggest prior infarct with no ischemia. Ischemic area correlates with echo WMA  ? ORIF FEMUR FRACTURE

## 2021-12-09 NOTE — Assessment & Plan Note (Signed)
Lab Results  ?Component Value Date  ? TSH 7.13 (H) 12/02/2021  ? ?Will re check in a mo with FT4 ?No complaints ?Fatigue is chronic ?

## 2021-12-09 NOTE — Assessment & Plan Note (Signed)
Reviewed health habits including diet and exercise and skin cancer prevention ?Reviewed appropriate screening tests for age  ?Also reviewed health mt list, fam hx and immunization status , as well as social and family history   ?See HPI ?Labs reviewed ?AM W reviewed ?Interested in [employer] and plans to check with pharmacy about coverage ?Tetanus shot postponed for insurance ?Mammogram up-to-date from last month ?DEXA is due, order done and patient will call [facility] to schedule ?No recent falls or fractures, taking vitamin D with a therapeutic level ?Declines colon cancer screening ?

## 2021-12-09 NOTE — Assessment & Plan Note (Signed)
Lab Results  ?Component Value Date  ? [id number] 12/02/2021  ? ? ?

## 2021-12-09 NOTE — Assessment & Plan Note (Signed)
bp in fair control at this time  ?BP Readings from Last 1 Encounters:  ?12/09/21 128/70  ? ?No changes needed ?Most recent labs reviewed  ?Disc lifstyle change with low sodium diet and exercise  ?Plan to continue losartan 25 mg daily and metoprolol 25 mg bid (in setting of CAD under card care) ?

## 2021-12-09 NOTE — Assessment & Plan Note (Signed)
dexa ordered ?Pt will call [facility] to schedule  ?No new falls or fractures ?Discussed fall prevention, uses cane and walker ?Taking vitamin D ?D level is therapeutic ? ?

## 2021-12-09 NOTE — Assessment & Plan Note (Signed)
protonix 40 mg daily  ?This helps/unable to stop it  ?B12 level is nl ?

## 2021-12-09 NOTE — Assessment & Plan Note (Signed)
Renal panel reviewed, better off the HCTZ ?

## 2021-12-09 NOTE — Assessment & Plan Note (Signed)
Disc goals for lipids and reasons to control them ?Rev last labs with pt ?Rev low sat fat diet in detail ?Taking atorvastatin 20 mg daily ?Today LDL is 74 ?Cardiology follows in setting of CAD ?

## 2021-12-09 NOTE — Assessment & Plan Note (Signed)
Improved, but sporadic ?Takes Tessalon as needed ?

## 2021-12-09 NOTE — Patient Instructions (Addendum)
If you are interested in the new shingles vaccine (Shingrix) - call your local pharmacy to check on coverage and availability  ?If affordable, get on a wait list at your pharmacy to get the vaccine. ? ? ?I ordered your bone density test  ?You can call [facility] and order it later this week  ? ?I want to check your thyroid test again in a month to see if you have hypothyroidism  ? ?Take care of yourself  ?Eat a healthy balanced diet and stay active safely  ? ? ? ? ?

## 2021-12-19 ENCOUNTER — Other Ambulatory Visit: Payer: Self-pay | Admitting: Family Medicine

## 2021-12-23 ENCOUNTER — Encounter: Payer: Self-pay | Admitting: Cardiology

## 2021-12-23 NOTE — Assessment & Plan Note (Signed)
Last LDL was 74.  Would like it to be less than 70 if not close to 50.  She is on atorvastatin 20 mg daily. ?Due for labs to be checked when she sees her PCP. ? ?Low threshold to consider switching to rosuvastatin for more potency with same dose. ?

## 2021-12-23 NOTE — Assessment & Plan Note (Addendum)
Known CAD with LCx PCI and moderate LAD disease. ?Myoview in October 2022 showed no evidence of ischemia.  This would argue that her dyspnea is not related to coronary ischemia or at least macrovascular disease. ? ?There is potential microvascular component.  She is on ARB and beta-blocker along with statin. ? ?She is on Plavix monotherapy for maintenance-can be held 5 to 7 days preop for surgical procedures... ? ?Refill PRN nitroglycerin.  Low threshold to consider switching from ARB to calcium channel blocker for microvascular disease benefit. ?

## 2021-12-23 NOTE — Assessment & Plan Note (Signed)
Just 5 years out from circumflex PCI. ? ?Remains on Plavix monotherapy.  Likely less GI bleeding concerns on Plavix over aspirin.  Therefore we have maintained Plavix alone. ? ?? If she does have bleeding or worsening anemia, I would hold for a week. ? ?? Okay to hold Plavix 5 to 7 days preop for surgical procedures. ?

## 2021-12-23 NOTE — Assessment & Plan Note (Signed)
Asymptomatic. ?EF stable on nonischemic Myoview ?

## 2021-12-23 NOTE — Assessment & Plan Note (Signed)
Stable blood pressure on current meds.  Low-dose losartan and Lopressor.  These are mostly on board because of CAD ?

## 2022-01-10 ENCOUNTER — Other Ambulatory Visit: Payer: Self-pay | Admitting: Family Medicine

## 2022-01-16 ENCOUNTER — Telehealth: Payer: Self-pay | Admitting: Hematology and Oncology

## 2022-01-16 NOTE — Telephone Encounter (Signed)
Rescheduled appointment per provider PAL. Patient is aware of the changes made to her upcoming appointment. 

## 2022-01-22 ENCOUNTER — Inpatient Hospital Stay: Payer: Medicare Other | Admitting: Hematology and Oncology

## 2022-01-22 ENCOUNTER — Inpatient Hospital Stay: Payer: Medicare Other

## 2022-01-29 ENCOUNTER — Other Ambulatory Visit: Payer: Self-pay

## 2022-01-29 ENCOUNTER — Inpatient Hospital Stay: Payer: Medicare Other

## 2022-01-29 ENCOUNTER — Inpatient Hospital Stay: Payer: Medicare Other | Attending: Hematology and Oncology

## 2022-01-29 ENCOUNTER — Encounter: Payer: Self-pay | Admitting: Adult Health

## 2022-01-29 ENCOUNTER — Inpatient Hospital Stay (HOSPITAL_BASED_OUTPATIENT_CLINIC_OR_DEPARTMENT_OTHER): Payer: Medicare Other | Admitting: Adult Health

## 2022-01-29 VITALS — BP 143/66 | HR 70 | Temp 98.1°F | Resp 18 | Ht 63.0 in | Wt 183.3 lb

## 2022-01-29 DIAGNOSIS — D638 Anemia in other chronic diseases classified elsewhere: Secondary | ICD-10-CM | POA: Insufficient documentation

## 2022-01-29 DIAGNOSIS — D649 Anemia, unspecified: Secondary | ICD-10-CM

## 2022-01-29 DIAGNOSIS — M353 Polymyalgia rheumatica: Secondary | ICD-10-CM | POA: Insufficient documentation

## 2022-01-29 LAB — CBC WITH DIFFERENTIAL (CANCER CENTER ONLY)
Abs Immature Granulocytes: 0.02 10*3/uL (ref 0.00–0.07)
Basophils Absolute: 0.1 10*3/uL (ref 0.0–0.1)
Basophils Relative: 1 %
Eosinophils Absolute: 0.1 10*3/uL (ref 0.0–0.5)
Eosinophils Relative: 2 %
HCT: 32.7 % — ABNORMAL LOW (ref 36.0–46.0)
Hemoglobin: 10.4 g/dL — ABNORMAL LOW (ref 12.0–15.0)
Immature Granulocytes: 0 %
Lymphocytes Relative: 29 %
Lymphs Abs: 1.7 10*3/uL (ref 0.7–4.0)
MCH: 29.1 pg (ref 26.0–34.0)
MCHC: 31.8 g/dL (ref 30.0–36.0)
MCV: 91.6 fL (ref 80.0–100.0)
Monocytes Absolute: 0.5 10*3/uL (ref 0.1–1.0)
Monocytes Relative: 8 %
Neutro Abs: 3.6 10*3/uL (ref 1.7–7.7)
Neutrophils Relative %: 60 %
Platelet Count: 224 10*3/uL (ref 150–400)
RBC: 3.57 MIL/uL — ABNORMAL LOW (ref 3.87–5.11)
RDW: 12.9 % (ref 11.5–15.5)
WBC Count: 5.9 10*3/uL (ref 4.0–10.5)
nRBC: 0 % (ref 0.0–0.2)

## 2022-01-29 MED ORDER — EPOETIN ALFA-EPBX 40000 UNIT/ML IJ SOLN
40000.0000 [IU] | Freq: Once | INTRAMUSCULAR | Status: AC
  Administered 2022-01-29: 40000 [IU] via SUBCUTANEOUS
  Filled 2022-01-29: qty 1

## 2022-01-29 NOTE — Progress Notes (Signed)
[facility] Follow up:    Tower, [name], MD [place] [address]   DIAGNOSIS: Normocytic normochromic anemia of chronic disease  SUMMARY OF HEMATOLOGIC HISTORY:  10/12/2017: Hemoglobin 10.2, MCV 94, normal WBC and platelet counts, creatinine 1.02 2.  04/11/2019: Hemoglobin 9, evaluated by Dr. 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   3.  Began Retacrit on 04/18/2019  CURRENT THERAPY: Retacrit every 12 weeks  INTERVAL HISTORY: [name] [name] 81 y.o. female returns for follow-up of her anemia of chronic disease.  She is doing well.  She has some mild fatigue and notes that this is stable.  She says her kids help her with getting up and around.   Patient Active Problem List   Diagnosis Date Noted   Elevated TSH 12/09/2021   Coronary artery disease involving native heart without angina pectoris 05/02/2021   Current use of proton pump inhibitor 10/29/2020   Cough 07/24/2020   History of transient ischemic attack (TIA) 06/04/2020   Normochromic normocytic anemia    History of femur fracture 02/04/2020   Renal insufficiency 11/01/2019   Pedal edema 10/04/2018   S/P shoulder replacement, left 12/17/2017   S/P reverse total shoulder arthroplasty, left 12/17/2017   Routine general medical examination at a health care facility 10/14/2017   Preop cardiovascular exam 10/01/2017   CAD S/P percutaneous coronary angioplasty 01/27/2017   Aortic regurgitation 01/27/2017   Dyspnea on exertion 08/19/2016   Left bundle branch block (LBBB) on electrocardiogram 08/19/2016   Estrogen deficiency 10/08/2015   Chronic fatigue 08/20/2015   Deficiency anemia 11/17/2013   Encounter for Medicare annual wellness exam 07/17/2013   PMR (polymyalgia rheumatica) ([facility]) 07/17/2013   Intertrigo 04/19/2013   Obesity (BMI 30-39.9) 04/05/2013   S/P right THA, AA 04/04/2013   IBS (irritable bowel syndrome) 05/30/2012    Anemia of chronic disease 04/16/2010   Generalized anxiety disorder 04/16/2010   GERD 03/04/2009   Vitamin D deficiency 03/12/2008   URINARY TRACT INFECTION, RECURRENT 08/07/2007   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   FIBROCYSTIC BREAST DISEASE, HX OF 02/11/2007   Hyperlipidemia with target low density lipoprotein (LDL) cholesterol less than 70 mg/dL 01/10/2007   DEPRESSION 01/10/2007   RETINAL VEIN OCCLUSION 01/10/2007   Essential hypertension 01/10/2007   PEPTIC ULCER DISEASE 01/10/2007   Shoulder arthritis 01/10/2007   SKIN CANCER, HX OF 01/10/2007   Localized, primary osteoarthritis of shoulder region 01/10/2007    is allergic to sulfonamide derivatives, ace inhibitors, hydrochlorothiazide, oxaprozin, pregabalin, and amoxicillin-pot clavulanate.  MEDICAL HISTORY: Past Medical History:  Diagnosis Date   Anemia    of chronic disease   Anxiety    Arthritis    "psoratic arthritis; new dx" (09/11/2016)   CAD S/P percutaneous coronary angioplasty 08/2016   mLCx 90% -> 0% (2.75 mm x 16 mm) Synergy DES PCI Ostial D1 70% (not PTCA target). Moderate ostial and mD2.  LAD tandem 50% stenoses w/ negative FFR (0.86)   Cancer (HCC)    skin cancer on leg   Chronic lower back pain    Complication of anesthesia 1980 and 1982   trouble waking up after breast biopsy, many surgeries since no problem   Depression    Dyspnea on effort - CHRONIC    partial relief of Sx post PCI of L Circumflex.   Fibrocystic breast    GERD (gastroesophageal reflux disease)    Headache    "monthly" (09/11/2016)   History of  blood transfusion    "several since age 57; I've had them after most of my surgeries" (09/11/2016)   History of myocardial infarction     - Myoview [name] 2018 indicated small Inferior - Inferoseptal Infarct (vs. rest ischemia). Echo with inferoseptal hypokinesis.   Hx of skin cancer, basal cell 2013 and 2012   right inner, lower leg; several scattered around my body   Hypertension     Iron deficiency anemia    "I've had an iron infusion"   Osteoarthritis    Osteopenia    Peptic ulcer disease    H pylori   PMR (polymyalgia rheumatica) (HCC)    PONV (postoperative nausea and vomiting)    Psoriasis    TIA (transient ischemic attack) 06/03/2020   Symptoms of right arm weakness/numbness and slurred speech as well as right droopm => MRI brain/MRA head normal.  Normal echo normal carotid Dopplers.    SURGICAL HISTORY: Past Surgical History:  Procedure Laterality Date   APPENDECTOMY     BACK SURGERY     x 6    BREAST BIOPSY Left 1980 and 1982   "benign"   CARDIAC CATHETERIZATION N/A 09/11/2016   Procedure: Left Heart Cath and Coronary Angiography;  Surgeon: Leonie Man, MD;  Location: [facility];  Service: Cardiovascular: mLCx 90% * (PCI). Ostial D1 70% (not PTCA target). Mod ostial and mD2.  LAD tandem 50% stenoses w/ negative FFR (0.86)   CARDIAC CATHETERIZATION N/A 09/11/2016   Procedure: Intravascular Pressure Wire/FFR Study;  Surgeon: Leonie Man, MD;  Location: [facility];  Service: Cardiovascular: --  LAD tandem 50% stenoses w/ negative FFR (0.86   CARDIAC CATHETERIZATION N/A 09/11/2016   Procedure: Coronary Stent Intervention;  Surgeon: Leonie Man, MD;  Location: [facility];  Service: Cardiovascular: -- DES PCI mLCx 90%--0% SYNERGY DES 2.75 MM X 16 MM.   CARDIAC CATHETERIZATION  2001   non-obstructive CAD   CATARACT EXTRACTION W/ INTRAOCULAR LENS  IMPLANT, BILATERAL  July -  August 2017   DILATION AND CURETTAGE OF UTERUS  X 3   ESOPHAGOGASTRODUODENOSCOPY  03/2009   ulcer,HP, GERD stricture   EXCISIONAL HEMORRHOIDECTOMY     HIP ARTHROPLASTY Left 2010   JOINT REPLACEMENT     KNEE ARTHROSCOPY Bilateral    LAPAROSCOPIC CHOLECYSTECTOMY  2005   LUMBAR [place] SURGERY  11/1984; 1987; 1989; 2004; 2005 X 2   deg. disk lumber spine    MOHS SURGERY Right    "inside my lower leg"   NASAL SINUS SURGERY  1970s   NM MYOVIEW LTD   08/28/2016   INTERMEDIATE RISK (b/c reduced EF & ? small infarct) -- EF ~35-40%.  ? Prior Small Inferior-apical & septal infarct (w/o ischemia).  Difuse HK - worse in inferoapex.  Suggest prior infarct with no ischemia. Ischemic area correlates with echo WMA   NM MYOVIEW LTD  05/27/2021   Findings consistent with prior anteroapical infarct with fixed defect.  No peri-infarct ischemia.  Notable improvement from prior study.:   ORIF FEMUR FRACTURE Right 01/04/2020   Procedure: OPEN REDUCTION INTERNAL FIXATION (ORIF) DISTAL FEMUR FRACTURE;  Surgeon: Rod Can, MD;  Location: [facility];  Service: Orthopedics;  Laterality: Right;   REVERSE SHOULDER ARTHROPLASTY Left 12/17/2017   Procedure: LEFT REVERSE SHOULDER ARTHROPLASTY;  Surgeon: Netta Cedars, MD;  Location: [facility];  Service: Orthopedics;  Laterality: Left;   TOTAL HIP ARTHROPLASTY Right 04/04/2013   Procedure: RIGHT TOTAL HIP ARTHROPLASTY ANTERIOR APPROACH;  Surgeon: Rodman Key  Marian Sorrow, MD;  Location: [facility];  Service: Orthopedics;  Laterality: Right;   TOTAL KNEE ARTHROPLASTY Right 2009   TRANSTHORACIC ECHOCARDIOGRAM  1/'18; 1/'19   a) 1/'18: mild conc LVH. EF 45-50% - anteroseptal & inferoseptal HK. Mod AI.; b) 1/'19 - post PCI: (post PCI) --> EF normalized:  Normal LV systolic function; mild diastolic dysfunction; mild   LVH; sclerotic aortic valve with mild AI.  No RWMA   TRANSTHORACIC ECHOCARDIOGRAM  06/04/2020    for TIA: Normal LV size and function.  No or WMA.  EF 60 to 65%.  GR 1 DD with elevated LAP.  Normal RV size and function.  Normal RVP, RAP.  Mild aortic valve calcification-sclerosis but no stenosis.  Trivial AI.  No embolic source   TUBAL LIGATION     VAGINAL HYSTERECTOMY  1970s   partial     SOCIAL HISTORY: Social History   Socioeconomic History   Marital status: Widowed    Spouse name: Not on file   Number of children: 5   Years of education: Not on file   Highest education level: Not on file  Occupational History    Not on file  Tobacco Use   Smoking status: Never   Smokeless tobacco: Never  Vaping Use   Vaping Use: Never used  Substance and Sexual Activity   Alcohol use: Yes    Alcohol/week: 0.0 standard drinks of alcohol    Comment: 09/11/2016 "might have a margarita q 3-4 months"   Drug use: No   Sexual activity: Not Currently  Other Topics Concern   Not on file  Social History Narrative   Now Widowed as of [name] 2020. Birthed 5 children. Lost one at age 64.   Isidor Holts recently moved out @ age 28 (was legal ward of 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   9 grandchildren    Social Determinants of Health   Financial Resource Strain: Low Risk  (12/03/2021)   Overall Financial Resource Strain (CARDIA)    Difficulty of Paying Living Expenses: Not hard at all  Food Insecurity: No Food Insecurity (12/03/2021)   Hunger Vital Sign    Worried About Running Out of Food in the Last Year: Never true    [place] in the Last Year: Never true  Transportation Needs: No Transportation Needs (12/03/2021)   PRAPARE - Hydrologist (Medical): No    Lack of Transportation (Non-Medical): No  Physical Activity: Insufficiently Active (12/03/2021)   Exercise Vital Sign    Days of Exercise per Week: 5 days    Minutes of Exercise per Session: 20 min  Stress: No Stress Concern Present (12/03/2021)   [place]    Feeling of Stress : Only a little  Social Connections: Moderately Integrated (12/03/2021)   Social Connection and Isolation Panel [NHANES]    Frequency of Communication with Friends and Family: More than three times a week    Frequency of Social Gatherings with Friends and Family: More than three times a week    Attends Religious Services: More than 4 times per year    Active Member of [employer] or  Organizations: Yes    Attends Archivist Meetings: More than 4 times per year    Marital Status: Widowed  Human resources officer  Violence: Not At Risk (12/03/2021)   Humiliation, Afraid, Rape, and Kick questionnaire    Fear of Current or Ex-Partner: No    Emotionally Abused: No    Physically Abused: No    Sexually Abused: No    FAMILY HISTORY: Family History  Problem Relation Age of Onset   Arthritis Mother    Emphysema Mother    Hyperlipidemia Mother    Hypertension Mother    Heart disease Mother    Heart disease Brother        CAD   Diabetes Paternal Grandmother    Heart disease Maternal Aunt     Review of Systems  Constitutional:  Positive for fatigue. Negative for appetite change, chills, fever and unexpected weight change.  HENT:   Negative for hearing loss, lump/mass and trouble swallowing.   Eyes:  Negative for eye problems and icterus.  Respiratory:  Negative for chest tightness, cough and shortness of breath.   Cardiovascular:  Negative for chest pain, leg swelling and palpitations.  Gastrointestinal:  Negative for abdominal distention, abdominal pain, constipation, diarrhea, nausea and vomiting.  Endocrine: Negative for hot flashes.  Genitourinary:  Negative for difficulty urinating.   Musculoskeletal:  Negative for arthralgias.  Skin:  Negative for itching and rash.  Neurological:  Negative for dizziness, extremity weakness, headaches and numbness.  Hematological:  Negative for adenopathy. Does not bruise/bleed easily.  Psychiatric/Behavioral:  Negative for depression. The patient is not nervous/anxious.       PHYSICAL EXAMINATION  ECOG PERFORMANCE STATUS: 1 - Symptomatic but completely ambulatory  Vitals:   01/29/22 1216  BP: (!) 143/66  Pulse: 70  Resp: 18  Temp: 98.1 F (36.7 C)  SpO2: 98%    Physical Exam Constitutional:      General: She is not in acute distress.    Appearance: Normal appearance. She is not toxic-appearing.  HENT:      Head: Normocephalic and atraumatic.  Eyes:     General: No scleral icterus. Cardiovascular:     Rate and Rhythm: Normal rate and regular rhythm.     Pulses: Normal pulses.     Heart sounds: Normal heart sounds.  Pulmonary:     Effort: Pulmonary effort is normal.     Breath sounds: Normal breath sounds.  Abdominal:     General: Abdomen is flat. Bowel sounds are normal. There is no distension.     Palpations: Abdomen is soft.     Tenderness: There is no abdominal tenderness.  Musculoskeletal:        General: No swelling.     Cervical back: Neck supple.  Lymphadenopathy:     Cervical: No cervical adenopathy.  Skin:    General: Skin is warm and dry.     Findings: No rash.  Neurological:     General: No focal deficit present.     Mental Status: She is alert.  Psychiatric:        Mood and Affect: Mood normal.        Behavior: Behavior normal.     LABORATORY DATA:  CBC    Component Value Date/Time   WBC 5.9 01/29/2022 1151   WBC 6.7 12/02/2021 0906   RBC 3.57 (L) 01/29/2022 1151   HGB 10.4 (L) 01/29/2022 1151   HGB 9.9 (L) 09/29/2016 1146   HGB 10.5 (L) 08/28/2015 1313   HCT 32.7 (L) 01/29/2022 1151   HCT 31.0 (L) 09/29/2016 1146   HCT 32.7 (L) 08/28/2015 1313   PLT 224 01/29/2022 1151   PLT  277 09/29/2016 1146   MCV 91.6 01/29/2022 1151   MCV 93 09/29/2016 1146   MCV 90.6 08/28/2015 1313   MCH 29.1 01/29/2022 1151   MCHC 31.8 01/29/2022 1151   RDW 12.9 01/29/2022 1151   RDW 15.4 09/29/2016 1146   RDW 12.9 08/28/2015 1313   LYMPHSABS 1.7 01/29/2022 1151   LYMPHSABS 1.3 08/28/2015 1313   MONOABS 0.5 01/29/2022 1151   MONOABS 0.6 08/28/2015 1313   EOSABS 0.1 01/29/2022 1151   EOSABS 0.0 08/28/2015 1313   EOSABS 0.1 04/03/2010 1147   BASOSABS 0.1 01/29/2022 1151   BASOSABS 0.0 08/28/2015 1313    CMP     Component Value Date/Time   NA 142 12/02/2021 0906   NA 131 (A) 02/06/2020 0000   NA 141 12/10/2014 1012   K 4.6 12/02/2021 0906   K 3.7 12/10/2014 1012    CL 107 12/02/2021 0906   CL 98 02/28/2009 1250   CO2 29 12/02/2021 0906   CO2 25 12/10/2014 1012   GLUCOSE 77 12/02/2021 0906   GLUCOSE 104 12/10/2014 1012   GLUCOSE 103 02/28/2009 1250   BUN 17 12/02/2021 0906   BUN 21 09/08/2016 1104   BUN 15.7 12/10/2014 1012   CREATININE 0.98 12/02/2021 0906   CREATININE 1.19 (H) 04/17/2021 1320   CREATININE 1.01 (H) 09/16/2018 1703   CREATININE 0.9 12/10/2014 1012   CALCIUM 8.8 12/02/2021 0906   CALCIUM 9.2 12/10/2014 1012   PROT 6.4 12/02/2021 0906   PROT 6.2 10/27/2016 1015   PROT 6.8 12/10/2014 1012   ALBUMIN 3.9 12/02/2021 0906   ALBUMIN 3.6 10/27/2016 1015   ALBUMIN 3.5 12/10/2014 1012   AST 19 12/02/2021 0906   AST 16 04/17/2021 1320   AST 18 12/10/2014 1012   ALT 15 12/02/2021 0906   ALT 11 04/17/2021 1320   ALT 13 12/10/2014 1012   ALKPHOS 67 12/02/2021 0906   ALKPHOS 61 12/10/2014 1012   BILITOT 0.3 12/02/2021 0906   BILITOT 0.4 04/17/2021 1320   BILITOT 0.47 12/10/2014 1012   GFRNONAA 46 (L) 04/17/2021 1320   GFRAA 56 (L) 05/02/2020 1146    ASSESSMENT and THERAPY PLAN:   Anemia of chronic disease Shelton Silvas is an 81 year old woman with anemia of chronic disease likely related to her polymyalgia rheumatica here today for follow-up and management.  Her hemoglobin remains above 10 at 10.4 today.  She receives Retacrit 40,000 units every 12 weeks so long as her hemoglobin is less than 11.  She tolerates this well.  She will proceed with her injection today.  She will return in 12 weeks for labs and her next injection.  With her next lab work I have added a ferritin and iron studies so that we can ensure her iron remains at an acceptable level for the Retacrit to work optimally.  We discussed the above with [name] in detail who is in agreement with the plan.  All questions were answered. The patient knows to call the clinic with any problems, questions or concerns. We can certainly see the patient much sooner if  necessary.  Total encounter time:20 minutes*in face-to-face visit time, chart review, lab review, care coordination, order entry, and documentation of the encounter time.  Wilber Bihari, NP 01/29/22 1:09 PM Medical Oncology and Hematology [facility] [place], [place] [address] Tel. [phone]    Fax. [phone]  *Total Encounter Time as defined by the Centers for Medicare and Medicaid Services includes, in addition to the face-to-face time of  a patient visit (documented in the note above) non-face-to-face time: obtaining and reviewing outside history, ordering and reviewing medications, tests or procedures, care coordination (communications with other health care professionals or caregivers) and documentation in the medical record.

## 2022-01-29 NOTE — Assessment & Plan Note (Signed)
[name] [name] is an 81 year old woman with anemia of chronic disease likely related to her polymyalgia rheumatica here today for follow-up and management.  Her hemoglobin remains above 10 at 10.4 today.  She receives Retacrit 40,000 units every 12 weeks so long as her hemoglobin is less than 11.  She tolerates this well.  She will proceed with her injection today.  She will return in 12 weeks for labs and her next injection.  With her next lab work I have added a ferritin and iron studies so that we can ensure her iron remains at an acceptable level for the Retacrit to work optimally.  We discussed the above with [name] [name] in detail who is in agreement with the plan.

## 2022-01-30 ENCOUNTER — Ambulatory Visit: Payer: Medicare Other | Admitting: Adult Health

## 2022-01-30 ENCOUNTER — Ambulatory Visit: Payer: Medicare Other

## 2022-01-30 ENCOUNTER — Other Ambulatory Visit: Payer: Medicare Other

## 2022-02-02 ENCOUNTER — Other Ambulatory Visit: Payer: Self-pay | Admitting: Family Medicine

## 2022-02-03 NOTE — Telephone Encounter (Signed)
Patient states she is out of medication and needs a refill.

## 2022-02-03 NOTE — Telephone Encounter (Signed)
Refill request Robaxin Last refill 10/27/21 #90/2 Last office visit 12/09/21

## 2022-02-20 ENCOUNTER — Other Ambulatory Visit: Payer: Self-pay | Admitting: Cardiology

## 2022-02-20 ENCOUNTER — Other Ambulatory Visit: Payer: Self-pay | Admitting: Family Medicine

## 2022-02-20 NOTE — Telephone Encounter (Signed)
Patient only has two pills left.

## 2022-03-11 ENCOUNTER — Encounter: Payer: Self-pay | Admitting: Family Medicine

## 2022-03-11 ENCOUNTER — Ambulatory Visit: Payer: Medicare Other | Admitting: Family Medicine

## 2022-03-11 VITALS — BP 134/86 | HR 60 | Ht 63.0 in | Wt 181.2 lb

## 2022-03-11 DIAGNOSIS — R600 Localized edema: Secondary | ICD-10-CM

## 2022-03-11 DIAGNOSIS — I10 Essential (primary) hypertension: Secondary | ICD-10-CM

## 2022-03-11 LAB — COMPREHENSIVE METABOLIC PANEL
ALT: 13 U/L (ref 0–35)
AST: 22 U/L (ref 0–37)
Albumin: 3.9 g/dL (ref 3.5–5.2)
Alkaline Phosphatase: 66 U/L (ref 39–117)
BUN: 15 mg/dL (ref 6–23)
CO2: 27 mEq/L (ref 19–32)
Calcium: 9 mg/dL (ref 8.4–10.5)
Chloride: 103 mEq/L (ref 96–112)
Creatinine, Ser: 1.02 mg/dL (ref 0.40–1.20)
GFR: 51.22 mL/min — ABNORMAL LOW (ref >60.00)
Glucose, Bld: 82 mg/dL (ref 70–99)
Potassium: 4.4 mEq/L (ref 3.5–5.1)
Sodium: 137 mEq/L (ref 135–145)
Total Bilirubin: 0.4 mg/dL (ref 0.2–1.2)
Total Protein: 7.1 g/dL (ref 6.0–8.3)

## 2022-03-11 NOTE — Assessment & Plan Note (Signed)
bp in fair control at this time  BP Readings from Last 1 Encounters:  03/11/22 134/86   No changes needed Most recent labs reviewed  Disc lifstyle change with low sodium diet and exercise  Plan to continue losartan 25 mg daily  Metoprolol 25 mg bid

## 2022-03-11 NOTE — Assessment & Plan Note (Addendum)
Acute on chronic  Worse with more sodium recently and hot weather Known DD (mild) but no worse sob (last echo rev) Known venous insuff  Not enough fluid intake also (counseled on this) DASH eating guideline given/needs to eat out less Declines support stockings /too uncomfortable inst to elevate feet when sitting  Reassuring exam cmet today to make sure no changes If this worsens could consider lasix

## 2022-03-11 NOTE — Patient Instructions (Addendum)
  For swelling:   Elevate feet when you sit  Drink more fluids (water especially)  Avoid excessive sodium and processed foods  Avoid the heat when you can  Avoid prolonged standing a long as you can   Let's check labs today to make sure nothing has changed   If your swelling gets worse please let me know  Also if your shortness of breath worsens or any new symptoms   Support hose are also a good option

## 2022-03-11 NOTE — Progress Notes (Signed)
Subjective:    Patient ID: [name] [name], female    DOB: 1941-08-23, 81 y.o.   MRN: [id number]  HPI Pt presents with swollen feet   Wt Readings from Last 3 Encounters:  03/11/22 181 lb 3.2 oz (82.2 kg)  01/29/22 183 lb 4.8 oz (83.1 kg)  12/09/21 183 lb 6 oz (83.2 kg)   32.10 kg/m  Started over the weekend   Ankles are more swollen than usual (worst on Sunday)  Leaves a mark with her shoes  Does not hurt but it is noticeable and it scares her   Did eat [place] food/taco salad on Sunday   When home eats a lot of fruit and yogurt  Some cereal -honey nut  Tomato sandwich-salt  Egg salad   Trying to drink water  Has good air conditioning and keeps house at 40    She watches sodium intake  She does eat out and now lives with son and diet may not be quite as good  Elevated feet when she sits (recliner and pillows in addition to that)   Better in the am and then worsens as the day goes on  Fluids could be better   Does not think this is bad enough for support hose   No new sob and no chest pain  Her sob is baseline    HTN in setting of CAD bp is stable today  No cp or palpitations or headaches or edema  No side effects to medicines  BP Readings from Last 3 Encounters:  03/11/22 134/86  01/29/22 (!) 143/66  12/09/21 128/70     Losartan 25 mg daily  Metoprolol 25 mg bid   Last echo 05/2020 showed grade 1 DD    Takes plavix No nsaids   Lab Results  Component Value Date   CREATININE 0.98 12/02/2021   BUN 17 12/02/2021   NA 142 12/02/2021   K 4.6 12/02/2021   CL 107 12/02/2021   CO2 29 12/02/2021   GFR 53.8   Lab Results  Component Value Date   ALT 15 12/02/2021   AST 19 12/02/2021   ALKPHOS 67 12/02/2021   BILITOT 0.3 12/02/2021    Lab Results  Component Value Date   TSH 7.13 (H) 12/02/2021    Patient Active Problem List   Diagnosis Date Noted   Elevated TSH 12/09/2021   Coronary artery disease involving native heart without angina  pectoris 05/02/2021   Current use of proton pump inhibitor 10/29/2020   Cough 07/24/2020   History of transient ischemic attack (TIA) 06/04/2020   Normochromic normocytic anemia    History of femur fracture 02/04/2020   Renal insufficiency 11/01/2019   Pedal edema 10/04/2018   S/P shoulder replacement, left 12/17/2017   S/P reverse total shoulder arthroplasty, left 12/17/2017   Routine general medical examination at a health care facility 10/14/2017   Preop cardiovascular exam 10/01/2017   CAD S/P percutaneous coronary angioplasty 01/27/2017   Aortic regurgitation 01/27/2017   Dyspnea on exertion 08/19/2016   Left bundle branch block (LBBB) on electrocardiogram 08/19/2016   Estrogen deficiency 10/08/2015   Chronic fatigue 08/20/2015   Deficiency anemia 11/17/2013   Encounter for Medicare annual wellness exam 07/17/2013   PMR (polymyalgia rheumatica) ([place]) 07/17/2013   Intertrigo 04/19/2013   Obesity (BMI 30-39.9) 04/05/2013   S/P right THA, AA 04/04/2013   IBS (irritable bowel syndrome) 05/30/2012   Anemia of chronic disease 04/16/2010   Generalized anxiety disorder 04/16/2010   GERD 03/04/2009  Vitamin D deficiency 03/12/2008   URINARY TRACT INFECTION, RECURRENT 08/07/2007   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   FIBROCYSTIC BREAST DISEASE, HX OF 02/11/2007   Hyperlipidemia with target low density lipoprotein (LDL) cholesterol less than 70 mg/dL 01/10/2007   DEPRESSION 01/10/2007   RETINAL VEIN OCCLUSION 01/10/2007   Essential hypertension 01/10/2007   PEPTIC ULCER DISEASE 01/10/2007   Shoulder arthritis 01/10/2007   SKIN CANCER, HX OF 01/10/2007   Localized, primary osteoarthritis of shoulder region 01/10/2007   Past Medical History:  Diagnosis Date   Anemia    of chronic disease   Anxiety    Arthritis    "psoratic arthritis; new dx" (09/11/2016)   CAD S/P percutaneous coronary angioplasty 08/2016   mLCx 90% -> 0% (2.75 mm x 16 mm) Synergy DES PCI Ostial D1 70%  (not PTCA target). Moderate ostial and mD2.  LAD tandem 50% stenoses w/ negative FFR (0.86)   Cancer (HCC)    skin cancer on leg   Chronic lower back pain    Complication of anesthesia 1980 and 1982   trouble waking up after breast biopsy, many surgeries since no problem   Depression    Dyspnea on effort - CHRONIC    partial relief of Sx post PCI of L Circumflex.   Fibrocystic breast    GERD (gastroesophageal reflux disease)    Headache    "monthly" (09/11/2016)   History of blood transfusion    "several since age 48; I've had them after most of my surgeries" (09/11/2016)   History of myocardial infarction     - Myoview [name] 2018 indicated small Inferior - Inferoseptal Infarct (vs. rest ischemia). Echo with inferoseptal hypokinesis.   Hx of skin cancer, basal cell 2013 and 2012   right inner, lower leg; several scattered around my body   Hypertension    Iron deficiency anemia    "I've had an iron infusion"   Osteoarthritis    Osteopenia    Peptic ulcer disease    H pylori   PMR (polymyalgia rheumatica) (HCC)    PONV (postoperative nausea and vomiting)    Psoriasis    TIA (transient ischemic attack) 06/03/2020   Symptoms of right arm weakness/numbness and slurred speech as well as right droopm => MRI brain/MRA head normal.  Normal echo normal carotid Dopplers.   Past Surgical History:  Procedure Laterality Date   APPENDECTOMY     BACK SURGERY     x 6    BREAST BIOPSY Left 1980 and 1982   "benign"   CARDIAC CATHETERIZATION N/A 09/11/2016   Procedure: Left Heart Cath and Coronary Angiography;  Surgeon: Leonie Man, MD;  Location: [facility];  Service: Cardiovascular: mLCx 90% * (PCI). Ostial D1 70% (not PTCA target). Mod ostial and mD2.  LAD tandem 50% stenoses w/ negative FFR (0.86)   CARDIAC CATHETERIZATION N/A 09/11/2016   Procedure: Intravascular Pressure Wire/FFR Study;  Surgeon: Leonie Man, MD;  Location: [facility];  Service: Cardiovascular: --  LAD  tandem 50% stenoses w/ negative FFR (0.86   CARDIAC CATHETERIZATION N/A 09/11/2016   Procedure: Coronary Stent Intervention;  Surgeon: Leonie Man, MD;  Location: [facility];  Service: Cardiovascular: -- DES PCI mLCx 90%--0% SYNERGY DES 2.75 MM X 16 MM.   CARDIAC CATHETERIZATION  2001   non-obstructive CAD   CATARACT EXTRACTION W/ INTRAOCULAR LENS  IMPLANT, BILATERAL  July -  August 2017   DILATION AND CURETTAGE OF UTERUS  X 3  ESOPHAGOGASTRODUODENOSCOPY  03/2009   ulcer,HP, GERD stricture   EXCISIONAL HEMORRHOIDECTOMY     HIP ARTHROPLASTY Left 2010   JOINT REPLACEMENT     KNEE ARTHROSCOPY Bilateral    LAPAROSCOPIC CHOLECYSTECTOMY  2005   LUMBAR [place] SURGERY  11/1984; 1987; 1989; 2004; 2005 X 2   deg. disk lumber spine    MOHS SURGERY Right    "inside my lower leg"   NASAL SINUS SURGERY  1970s   NM MYOVIEW LTD  08/28/2016   INTERMEDIATE RISK (b/c reduced EF & ? small infarct) -- EF ~35-40%.  ? Prior Small Inferior-apical & septal infarct (w/o ischemia).  Difuse HK - worse in inferoapex.  Suggest prior infarct with no ischemia. Ischemic area correlates with echo WMA   NM MYOVIEW LTD  05/27/2021   Findings consistent with prior anteroapical infarct with fixed defect.  No peri-infarct ischemia.  Notable improvement from prior study.:   ORIF FEMUR FRACTURE Right 01/04/2020   Procedure: OPEN REDUCTION INTERNAL FIXATION (ORIF) DISTAL FEMUR FRACTURE;  Surgeon: Rod Can, MD;  Location: [facility];  Service: Orthopedics;  Laterality: Right;   REVERSE SHOULDER ARTHROPLASTY Left 12/17/2017   Procedure: LEFT REVERSE SHOULDER ARTHROPLASTY;  Surgeon: Netta Cedars, MD;  Location: [facility];  Service: Orthopedics;  Laterality: Left;   TOTAL HIP ARTHROPLASTY Right 04/04/2013   Procedure: RIGHT TOTAL HIP ARTHROPLASTY ANTERIOR APPROACH;  Surgeon: Mauri Pole, MD;  Location: [facility];  Service: Orthopedics;  Laterality: Right;   TOTAL KNEE ARTHROPLASTY Right 2009   TRANSTHORACIC ECHOCARDIOGRAM   1/'18; 1/'19   a) 1/'18: mild conc LVH. EF 45-50% - anteroseptal & inferoseptal HK. Mod AI.; b) 1/'19 - post PCI: (post PCI) --> EF normalized:  Normal LV systolic function; mild diastolic dysfunction; mild   LVH; sclerotic aortic valve with mild AI.  No RWMA   TRANSTHORACIC ECHOCARDIOGRAM  06/04/2020    for TIA: Normal LV size and function.  No or WMA.  EF 60 to 65%.  GR 1 DD with elevated LAP.  Normal RV size and function.  Normal RVP, RAP.  Mild aortic valve calcification-sclerosis but no stenosis.  Trivial AI.  No embolic source   TUBAL LIGATION     VAGINAL HYSTERECTOMY  1970s   partial    Social History   Tobacco Use   Smoking status: Never   Smokeless tobacco: Never  Vaping Use   Vaping Use: Never used  Substance Use Topics   Alcohol use: Yes    Alcohol/week: 0.0 standard drinks of alcohol    Comment: 09/11/2016 "might have a margarita q 3-4 months"   Drug use: No   Family History  Problem Relation Age of Onset   Arthritis Mother    Emphysema Mother    Hyperlipidemia Mother    Hypertension Mother    Heart disease Mother    Heart disease Brother        CAD   Diabetes Paternal Grandmother    Heart disease Maternal Aunt    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   Ace Inhibitors     cough   Hydrochlorothiazide     Hyponatremia    Oxaprozin     GI upset    Pregabalin Swelling    SWELLING REACTION UNSPECIFIED    Amoxicillin-Pot Clavulanate Nausea And Vomiting    gi upset Has patient had a PCN reaction causing immediate rash, facial/tongue/throat swelling, SOB or lightheadedness with hypotension: No Has patient had  a PCN reaction causing severe rash involving mucus membranes or skin necrosis: No Has patient had a PCN reaction that required hospitalization No Has patient had a PCN reaction occurring within the last 10 years: No If all of the above answers are "NO", then may proceed with  Cephalosporin use.    Current Outpatient Medications on File Prior to Visit  Medication Sig Dispense Refill   acetaminophen (TYLENOL) 500 MG tablet Take 1,000 mg by mouth 2 (two) times daily as needed for moderate pain or headache.      albuterol (VENTOLIN HFA) 108 (90 Base) MCG/ACT inhaler INHALE 1 PUFF INTO THE LUNGS EVERY 6 (SIX) HOURS AS NEEDED (FOR COUGH). 6.7 each 1   atorvastatin (LIPITOR) 20 MG tablet TAKE 1 TABLET BY MOUTH EVERY DAY 90 tablet 2   benzonatate (TESSALON) 200 MG capsule TAKE 1 CAPSULE (200 MG TOTAL) BY MOUTH 3 (THREE) TIMES DAILY AS NEEDED FOR COUGH. 90 capsule 1   busPIRone (BUSPAR) 15 MG tablet TAKE 1 TABLET BY MOUTH EVERY DAY 90 tablet 1   cetirizine (ZYRTEC) 10 MG tablet TAKE 1 TABLET BY MOUTH EVERY DAY 90 tablet 2   clopidogrel (PLAVIX) 75 MG tablet TAKE 1 TABLET BY MOUTH EVERY DAY WITH BREAKFAST 90 tablet 2   fluticasone (FLONASE) 50 MCG/ACT nasal spray Place 1 spray into both nostrils daily as needed. 48 mL 1   HYDROcodone-acetaminophen (NORCO/VICODIN) 5-325 MG tablet Take 1 tablet by mouth 3 (three) times daily as needed for moderate pain. 30 tablet 0   losartan (COZAAR) 25 MG tablet TAKE 1 TABLET BY MOUTH EVERY DAY 90 tablet 2   Melatonin 3 MG CAPS Take 6 mg by mouth at bedtime.      methocarbamol (ROBAXIN) 500 MG tablet TAKE 1 TABLET BY MOUTH EVERY 8 HOURS AS NEEDED FOR MUSCLE SPASM 90 tablet 2   metoprolol tartrate (LOPRESSOR) 25 MG tablet TAKE 1 TABLET BY MOUTH TWICE A DAY 180 tablet 1   nystatin (MYCOSTATIN/NYSTOP) powder Apply topically 2 (two) times daily as needed. To affected areas to prevent yeast (in skin folds) 30 g 1   pantoprazole (PROTONIX) 40 MG tablet TAKE 1 TABLET BY MOUTH EVERY DAY 90 tablet 2   sertraline (ZOLOFT) 25 MG tablet TAKE 1 TABLET (25 MG TOTAL) BY MOUTH DAILY. 90 tablet 3   Vitamin D, Cholecalciferol, 25 MCG (1000 UT) CAPS Take 2,000 Units by mouth in the morning and at bedtime.      nitroGLYCERIN (NITROSTAT) 0.4 MG SL tablet Place 1  tablet (0.4 mg total) under the tongue every 5 (five) minutes as needed. 25 tablet 3   No current facility-administered medications on file prior to visit.    Review of Systems  Constitutional:  Positive for fatigue. Negative for activity change, appetite change, fever and unexpected weight change.  HENT:  Negative for congestion, ear pain, rhinorrhea, sinus pressure and sore throat.   Eyes:  Negative for pain, redness and visual disturbance.  Respiratory:  Negative for cough, shortness of breath and wheezing.        Sob baseline-no worse   Cardiovascular:  Positive for leg swelling. Negative for chest pain and palpitations.  Gastrointestinal:  Negative for abdominal pain, blood in stool, constipation and diarrhea.  Endocrine: Negative for polydipsia and polyuria.  Genitourinary:  Negative for dysuria, frequency and urgency.  Musculoskeletal:  Negative for arthralgias, back pain and myalgias.  Skin:  Negative for pallor and rash.  Allergic/Immunologic: Negative for environmental allergies.  Neurological:  Negative for dizziness,  syncope and headaches.  Hematological:  Negative for adenopathy. Does not bruise/bleed easily.  Psychiatric/Behavioral:  Negative for decreased concentration and dysphoric mood. The patient is not nervous/anxious.        Objective:   Physical Exam Constitutional:      General: She is not in acute distress.    Appearance: Normal appearance. She is well-developed. She is obese. She is not ill-appearing or diaphoretic.  HENT:     Head: Normocephalic and atraumatic.  Eyes:     Conjunctiva/sclera: Conjunctivae normal.     Pupils: Pupils are equal, round, and reactive to light.  Neck:     Thyroid: No thyromegaly.     Vascular: No carotid bruit or JVD.  Cardiovascular:     Rate and Rhythm: Normal rate and regular rhythm.     Pulses: Normal pulses.     Heart sounds: Normal heart sounds.     No gallop.  Pulmonary:     Effort: Pulmonary effort is normal. No  respiratory distress.     Breath sounds: Normal breath sounds. No wheezing or rales.  Abdominal:     General: There is no distension or abdominal bruit.     Palpations: Abdomen is soft.  Musculoskeletal:     Cervical back: Normal range of motion and neck supple.     Right lower leg: Edema present.     Left lower leg: Edema present.     Comments: Trace to 1plus edema both feet to lower ankle  Marks noted from her flip flops No tendernes No M in LEs,no palp cords Some varicosities  No skin changes   Lymphadenopathy:     Cervical: No cervical adenopathy.  Skin:    General: Skin is warm and dry.     Coloration: Skin is not pale.     Findings: No rash.     Comments: Solar lentigines diffusely Also sks   Neurological:     Mental Status: She is alert.     Cranial Nerves: No cranial nerve deficit.     Coordination: Coordination normal.     Deep Tendon Reflexes: Reflexes are normal and symmetric. Reflexes normal.  Psychiatric:        Mood and Affect: Mood normal.           Assessment & Plan:   .  Problem List Items Addressed This Visit       Cardiovascular and Mediastinum   Essential hypertension - Primary (Chronic)    bp in fair control at this time  BP Readings from Last 1 Encounters:  03/11/22 134/86  No changes needed Most recent labs reviewed  Disc lifstyle change with low sodium diet and exercise  Plan to continue losartan 25 mg daily  Metoprolol 25 mg bid       Relevant Orders   Comprehensive metabolic panel     Other   Pedal edema    Acute on chronic  Worse with more sodium recently and hot weather Known DD (mild) but no worse sob (last echo rev) Known venous insuff  Not enough fluid intake also (counseled on this) DASH eating guideline given/needs to eat out less Declines support stockings /too uncomfortable inst to elevate feet when sitting  Reassuring exam cmet today to make sure no changes If this worsens could consider lasix

## 2022-04-08 ENCOUNTER — Other Ambulatory Visit: Payer: Self-pay | Admitting: Family Medicine

## 2022-04-08 DIAGNOSIS — J302 Other seasonal allergic rhinitis: Secondary | ICD-10-CM

## 2022-04-10 ENCOUNTER — Other Ambulatory Visit: Payer: Self-pay | Admitting: Family Medicine

## 2022-04-23 ENCOUNTER — Other Ambulatory Visit: Payer: Self-pay

## 2022-04-23 ENCOUNTER — Inpatient Hospital Stay: Payer: Medicare Other | Attending: Hematology and Oncology

## 2022-04-23 ENCOUNTER — Inpatient Hospital Stay: Payer: Medicare Other

## 2022-04-23 VITALS — BP 158/77 | HR 79 | Temp 98.2°F

## 2022-04-23 DIAGNOSIS — M353 Polymyalgia rheumatica: Secondary | ICD-10-CM | POA: Diagnosis present

## 2022-04-23 DIAGNOSIS — Z79899 Other long term (current) drug therapy: Secondary | ICD-10-CM | POA: Diagnosis not present

## 2022-04-23 DIAGNOSIS — D638 Anemia in other chronic diseases classified elsewhere: Secondary | ICD-10-CM

## 2022-04-23 DIAGNOSIS — D649 Anemia, unspecified: Secondary | ICD-10-CM

## 2022-04-23 LAB — CBC WITH DIFFERENTIAL (CANCER CENTER ONLY)
Abs Immature Granulocytes: 0.01 10*3/uL (ref 0.00–0.07)
Basophils Absolute: 0.1 10*3/uL (ref 0.0–0.1)
Basophils Relative: 1 %
Eosinophils Absolute: 0.3 10*3/uL (ref 0.0–0.5)
Eosinophils Relative: 5 %
HCT: 33.3 % — ABNORMAL LOW (ref 36.0–46.0)
Hemoglobin: 10.7 g/dL — ABNORMAL LOW (ref 12.0–15.0)
Immature Granulocytes: 0 %
Lymphocytes Relative: 37 %
Lymphs Abs: 2.2 10*3/uL (ref 0.7–4.0)
MCH: 28.8 pg (ref 26.0–34.0)
MCHC: 32.1 g/dL (ref 30.0–36.0)
MCV: 89.8 fL (ref 80.0–100.0)
Monocytes Absolute: 0.4 10*3/uL (ref 0.1–1.0)
Monocytes Relative: 7 %
Neutro Abs: 3 10*3/uL (ref 1.7–7.7)
Neutrophils Relative %: 50 %
Platelet Count: 244 10*3/uL (ref 150–400)
RBC: 3.71 MIL/uL — ABNORMAL LOW (ref 3.87–5.11)
RDW: 12.7 % (ref 11.5–15.5)
WBC Count: 6 10*3/uL (ref 4.0–10.5)
nRBC: 0 % (ref 0.0–0.2)

## 2022-04-23 LAB — IRON AND IRON BINDING CAPACITY (CC-WL,HP ONLY)
Iron: 42 ug/dL (ref 28–170)
Saturation Ratios: 14 % (ref 10.4–31.8)
TIBC: 291 ug/dL (ref 250–450)
UIBC: 249 ug/dL (ref 148–442)

## 2022-04-23 LAB — VITAMIN B12: Vitamin B-12: 575 pg/mL (ref 180–914)

## 2022-04-23 LAB — FERRITIN: Ferritin: 63 ng/mL (ref 11–307)

## 2022-04-23 MED ORDER — EPOETIN ALFA-EPBX 40000 UNIT/ML IJ SOLN
40000.0000 [IU] | Freq: Once | INTRAMUSCULAR | Status: AC
  Administered 2022-04-23: 40000 [IU] via SUBCUTANEOUS
  Filled 2022-04-23: qty 1

## 2022-04-23 NOTE — Patient Instructions (Signed)
Epoetin Alfa injection °What is this medication? °EPOETIN ALFA (e POE e tin AL fa) helps your body make more red blood cells. This medicine is used to treat anemia caused by chronic kidney disease, cancer chemotherapy, or HIV-therapy. It may also be used before surgery if you have anemia. °This medicine may be used for other purposes; ask your health care provider or pharmacist if you have questions. °COMMON BRAND NAME(S): Epogen, Procrit, Retacrit °What should I tell my care team before I take this medication? °They need to know if you have any of these conditions: °cancer °heart disease °high blood pressure °history of blood clots °history of stroke °low levels of folate, iron, or vitamin B12 in the blood °seizures °an unusual or allergic reaction to erythropoietin, albumin, benzyl alcohol, hamster proteins, other medicines, foods, dyes, or preservatives °pregnant or trying to get pregnant °breast-feeding °How should I use this medication? °This medicine is for injection into a vein or under the skin. It is usually given by a health care professional in a hospital or clinic setting. °If you get this medicine at home, you will be taught how to prepare and give this medicine. Use exactly as directed. Take your medicine at regular intervals. Do not take your medicine more often than directed. °It is important that you put your used needles and syringes in a special sharps container. Do not put them in a trash can. If you do not have a sharps container, call your pharmacist or healthcare provider to get one. °A special MedGuide will be given to you by the pharmacist with each prescription and refill. Be sure to read this information carefully each time. °Talk to your pediatrician regarding the use of this medicine in children. While this drug may be prescribed for selected conditions, precautions do apply. °Overdosage: If you think you have taken too much of this medicine contact a poison control center or emergency  room at once. °NOTE: This medicine is only for you. Do not share this medicine with others. °What if I miss a dose? °If you miss a dose, take it as soon as you can. If it is almost time for your next dose, take only that dose. Do not take double or extra doses. °What may interact with this medication? °Interactions have not been studied. °This list may not describe all possible interactions. Give your health care provider a list of all the medicines, herbs, non-prescription drugs, or dietary supplements you use. Also tell them if you smoke, drink alcohol, or use illegal drugs. Some items may interact with your medicine. °What should I watch for while using this medication? °Your condition will be monitored carefully while you are receiving this medicine. °You may need blood work done while you are taking this medicine. °This medicine may cause a decrease in vitamin B6. You should make sure that you get enough vitamin B6 while you are taking this medicine. Discuss the foods you eat and the vitamins you take with your health care professional. °What side effects may I notice from receiving this medication? °Side effects that you should report to your doctor or health care professional as soon as possible: °allergic reactions like skin rash, itching or hives, swelling of the face, lips, or tongue °seizures °signs and symptoms of a blood clot such as breathing problems; changes in vision; chest pain; severe, sudden headache; pain, swelling, warmth in the leg; trouble speaking; sudden numbness or weakness of the face, arm or leg °signs and symptoms of a stroke like   changes in vision; confusion; trouble speaking or understanding; severe headaches; sudden numbness or weakness of the face, arm or leg; trouble walking; dizziness; loss of balance or coordination °Side effects that usually do not require medical attention (report to your doctor or health care professional if they continue or are  bothersome): °chills °cough °dizziness °fever °headaches °joint pain °muscle cramps °muscle pain °nausea, vomiting °pain, redness, or irritation at site where injected °This list may not describe all possible side effects. Call your doctor for medical advice about side effects. You may report side effects to FDA at 1-800-FDA-1088. °Where should I keep my medication? °Keep out of the reach of children. °Store in a refrigerator between 2 and 8 degrees C (36 and 46 degrees F). Do not freeze or shake. Throw away any unused portion if using a single-dose vial. Multi-dose vials can be kept in the refrigerator for up to 21 days after the initial dose. Throw away unused medicine. °NOTE: This sheet is a summary. It may not cover all possible information. If you have questions about this medicine, talk to your doctor, pharmacist, or health care provider. °© 2022 [employer] (2017-04-13 00:00:00) ° °

## 2022-04-30 ENCOUNTER — Other Ambulatory Visit: Payer: Self-pay | Admitting: Family Medicine

## 2022-04-30 NOTE — Telephone Encounter (Signed)
I think this is about 6-7 days too early  (unless she needs early for some reason)

## 2022-04-30 NOTE — Telephone Encounter (Signed)
Last filled on 02/03/22 #90 tab/ 2 refills, last OV was 03/11/22 for an acute issue, please advise

## 2022-05-04 ENCOUNTER — Telehealth: Payer: Self-pay | Admitting: Family Medicine

## 2022-05-04 NOTE — Telephone Encounter (Signed)
Addressed through refill request.

## 2022-05-04 NOTE — Telephone Encounter (Signed)
  Encourage patient to contact the pharmacy for refills or they can request refills through [facility]  Did the patient contact the pharmacy: Yes  LAST APPOINTMENT DATE: 03/11/2022  NEXT APPOINTMENT DATE: N/A  MEDICATION: methocarbamol (ROBAXIN) 500 MG tablet  Is the patient out of medication? Yes  PHARMACY: [id number] [place] - [address] Let patient know to contact pharmacy at the end of the day to make sure medication is ready.  Please notify patient to allow 48-72 hours to process

## 2022-05-04 NOTE — Telephone Encounter (Signed)
Closer to refill date, pt is requesting Rx to be refilled now, thanks

## 2022-05-13 ENCOUNTER — Ambulatory Visit: Payer: Medicare Other | Attending: Cardiology | Admitting: Cardiology

## 2022-05-13 VITALS — BP 138/60 | HR 62 | Ht 62.75 in | Wt 178.8 lb

## 2022-05-13 DIAGNOSIS — I2510 Atherosclerotic heart disease of native coronary artery without angina pectoris: Secondary | ICD-10-CM

## 2022-05-13 DIAGNOSIS — I10 Essential (primary) hypertension: Secondary | ICD-10-CM

## 2022-05-13 DIAGNOSIS — R600 Localized edema: Secondary | ICD-10-CM

## 2022-05-13 DIAGNOSIS — E785 Hyperlipidemia, unspecified: Secondary | ICD-10-CM

## 2022-05-13 DIAGNOSIS — D638 Anemia in other chronic diseases classified elsewhere: Secondary | ICD-10-CM

## 2022-05-13 DIAGNOSIS — Z9861 Coronary angioplasty status: Secondary | ICD-10-CM

## 2022-05-13 MED ORDER — ASPIRIN 81 MG PO TBEC: 81.0000 mg | DELAYED_RELEASE_TABLET | Freq: Every day | ORAL | 3 refills | Status: DC

## 2022-05-13 NOTE — Patient Instructions (Addendum)
Medication Instructions:    Complete your bottle of Plavix  ( clopidogrel) then stop  then start Aspirin 81 mg   *If you need a refill on your cardiac medications before your next appointment, please call your pharmacy*   Lab Work:fasting  Lipid Hepatic panel can do when you get lab done at the cancer center  If you have labs (blood work) drawn today and your tests are completely normal, you will receive your results only by: [facility] (if you have MyChart) OR A paper copy in the mail If you have any lab test that is abnormal or we need to change your treatment, we will call you to review the results.   Testing/Procedures:  Not needed  Follow-Up: At [facility], you and your health needs are our priority.  As part of our continuing mission to provide you with exceptional heart care, we have created designated Provider Care Teams.  These Care Teams include your primary Cardiologist (physician) and Advanced Practice Providers (APPs -  Physician Assistants and Nurse Practitioners) who all work together to provide you with the care you need, when you need it.   Your next appointment:   6 month(s)  The format for your next appointment:   In Person  Provider:   Glenetta Hew, MD    Other Instructions

## 2022-05-13 NOTE — Progress Notes (Signed)
Primary Care Provider: Abner Greenspan, MD [place] Cardiologist: Glenetta Hew, MD Electrophysiologist: None  Clinic Note: Chief Complaint  Patient presents with   Follow-up    Annual.   Right greater than left lower extremity edema.  End of day.   Coronary Artery Disease    No angina.  Still notes exertional dyspnea, but is deconditioned.    ===================================  ASSESSMENT/PLAN   Problem List Items Addressed This Visit       Cardiology Problems   CAD S/P percutaneous coronary angioplasty (Chronic)    5-1/2 years out from PCI.  No active bleeding issues but she does have some bruising.  I think with her balance of concern at the knee would like to be little less aggressive with antiplatelet agent.  Plan: DC Plavix, convert to 81 mg aspirin. Okay to hold aspirin 7 days preop for surgery or procedures.      Relevant Medications   aspirin EC 81 MG tablet   Essential hypertension (Chronic)    Fairly well controlled.  Initial pressure was high but better on recheck.  Was 134/86 at PCPs office in July.  This is more consistent with what I got on recheck here today.  Would not want tissue for overly aggressive blood pressure control-likely more based on previous guidelines and the guidelines of 130/80 mmHg.  Continue current dose of losartan and metoprolol tartrate.  No change for now. She does pretty well with lifestyle-low-salt diet,.  She could try to get more exercise      Relevant Medications   aspirin EC 81 MG tablet   Hyperlipidemia with target low density lipoprotein (LDL) cholesterol less than 70 mg/dL (Chronic)    Labs were pretty close for the last 2 checks with LDL of 74 and 75.  I do think that if we switched him from atorvastatin 20 mg to rosuvastatin 20 mg daily she would probably get clear.  I would like to see what her follow-up labs look like, if not yet at goal we will switch.      Relevant Medications   aspirin EC 81 MG tablet    Coronary artery disease involving native heart without angina pectoris - Primary (Chronic)    Known CAD with PCI of the LCx and moderate LAD disease not flow-limiting by FFR.  Myoview last year was nonischemic, indicating that her chronic dyspnea is not related to coronary ischemia at least from macrovascular disease.  Plan: Switching from Plavix to aspirin as noted-okay to hold aspirin for procedures or surgeries. On stable moderate dose Lopressor.  25 mg twice daily On low-dose losartan 25 mg daily. On modest dose atorvastatin 20 mg daily-LDL still not quite at goal, but close.      Relevant Medications   aspirin EC 81 MG tablet     Other   Pedal edema   Anemia of chronic disease (Chronic)    She has This chronic anemia this being followed by PCP.  Could easily be related to some of her dyspnea and fatigue.   Would like to see what happens if we switch from Plavix to aspirin.       ===================================  HPI:    [name] [name] is a 81 y.o. female with a PMH below who presents today for planned annual follow-up. at the request of Abner Greenspan, MD.  Notable Cardiac History / [facility] Dec 2017-January 2018: Evaluated for DOE: Echo EF 45-50% Septal/Inferoseptal HK, Mod AI -> Abnormal Myoview EF ~44% with ?  Prior infarct & peri-infarct ischemia CAD- DES PCI mid LCx (90%). Mod LAD Dz @ small Diag (FFR Negative) HTN, HLD,  Baseline DOE: CPX January 2020:- Suggested deconditioning.  Recommended PT OT referral. TIA 06/03/2020 -> recommended DAPT x3 weeks and then Plavix long-term along with statin.  Also placed on propranolol for GER GAD  Chronic Anemia (intermittent Epo injections -now q 12 weeks) Fall with Distal Femur Fxr May 2021  [name] [name] was last seen on May 02, 2021 -> was having exertional dyspnea that was seemingly more prominent.  Noted hemoglobin levels were stable above 10.  Due to worsening dyspnea, we ordered a Myoview stress test reviewed  below  Recent Hospitalizations: None  Reviewed  CV studies:    The following studies were reviewed today: (if available, images/films reviewed: From Epic Chart or Care Everywhere)  Normal LV function.  Apical perfusion improved.  No evidence of ischemia with decreased mild fixed perfusion defect (consistent with infarct)  Interval History:   [name] [name] returns here today for annual follow-up.  As usual she brings her son with her to help her remember things.  She is doing pretty well.  She said she is a little bit concerned that her blood pressure was high today but realized that she did just have a couple coffee and had to walk a longer way than usual to get into the office.  We rechecked the blood pressure and it was better. She is doing pretty well with exercise level.  She denies any issues of any chest discomfort.  She walks and checks her oxygen levels.  Never goes below 92%.  She does get some exertional dyspnea but is deconditioned.  She uses a cane for balance walking short distances but uses a walker when she is walking father.  A lot of the dyspnea she feels is because her right leg hurts=Ever since her femur fracture she has been less active.  She still has swelling in that leg that gets worse after being up on her feet all day long.  Usually goes down when she elevates her feet.  CV Review of Symptoms (Summary): positive for - dyspnea on exertion, edema, and exercise intolerance but limited more because of leg pain and unsteady gait. negative for - chest pain, irregular heartbeat, orthopnea, palpitations, paroxysmal nocturnal dyspnea, rapid heart rate, shortness of breath, or lightheadedness or dizziness, syncope/near syncope or TIA/amaurosis fugax, claudication  REVIEWED OF SYSTEMS   Review of Systems  Constitutional:  Positive for malaise/fatigue (She has baseline low energy level because she is not very active but when she does get active she does okay.).  HENT:  Negative  for congestion.   Respiratory:  Negative for cough and shortness of breath (No more than baseline exertional dyspnea).   Gastrointestinal:  Positive for constipation (Off-and-on). Negative for blood in stool, diarrhea and melena.  Genitourinary:  Negative for frequency and hematuria.  Musculoskeletal:  Positive for back pain and joint pain (Right hip still hurts, but better). Negative for falls.       Musculoskeletal pain is what limits her activity.  Unsteady gait-uses either a cane or walker.  Neurological:  Negative for dizziness (She does have some occasional vertigo.), focal weakness and loss of consciousness.  Psychiatric/Behavioral:  Positive for memory loss. Negative for depression. The patient is not nervous/anxious and does not have insomnia.   None. I have reviewed and (if needed) personally updated the patient's problem list, medications, allergies, past medical and surgical history, social  and family history.   PAST MEDICAL HISTORY   Past Medical History:  Diagnosis Date   Anemia    of chronic disease   Anxiety    Arthritis    "psoratic arthritis; new dx" (09/11/2016)   CAD S/P percutaneous coronary angioplasty 08/2016   mLCx 90% -> 0% (2.75 mm x 16 mm) Synergy DES PCI Ostial D1 70% (not PTCA target). Moderate ostial and mD2.  LAD tandem 50% stenoses w/ negative FFR (0.86)   Cancer (HCC)    skin cancer on leg   Chronic lower back pain    Complication of anesthesia 1980 and 1982   trouble waking up after breast biopsy, many surgeries since no problem   Depression    Dyspnea on effort - CHRONIC    partial relief of Sx post PCI of L Circumflex.   Fibrocystic breast    GERD (gastroesophageal reflux disease)    Headache    "monthly" (09/11/2016)   History of blood transfusion    "several since age 71; I've had them after most of my surgeries" (09/11/2016)   History of myocardial infarction     - Myoview [name] 2018 indicated small Inferior - Inferoseptal Infarct (vs. rest  ischemia). Echo with inferoseptal hypokinesis.   Hx of skin cancer, basal cell 2013 and 2012   right inner, lower leg; several scattered around my body   Hypertension    Iron deficiency anemia    "I've had an iron infusion"   Osteoarthritis    Osteopenia    Peptic ulcer disease    H pylori   PMR (polymyalgia rheumatica) (HCC)    PONV (postoperative nausea and vomiting)    Psoriasis    TIA (transient ischemic attack) 06/03/2020   Symptoms of right arm weakness/numbness and slurred speech as well as right droopm => MRI brain/MRA head normal.  Normal echo normal carotid Dopplers.    PAST SURGICAL HISTORY   Past Surgical History:  Procedure Laterality Date   APPENDECTOMY     BACK SURGERY     x 6    BREAST BIOPSY Left 1980 and 1982   "benign"   CARDIAC CATHETERIZATION N/A 09/11/2016   Procedure: Left Heart Cath and Coronary Angiography;  Surgeon: Leonie Man, MD;  Location: [facility];  Service: Cardiovascular: mLCx 90% * (PCI). Ostial D1 70% (not PTCA target). Mod ostial and mD2.  LAD tandem 50% stenoses w/ negative FFR (0.86)   CARDIAC CATHETERIZATION N/A 09/11/2016   Procedure: Intravascular Pressure Wire/FFR Study;  Surgeon: Leonie Man, MD;  Location: [facility];  Service: Cardiovascular: --  LAD tandem 50% stenoses w/ negative FFR (0.86   CARDIAC CATHETERIZATION N/A 09/11/2016   Procedure: Coronary Stent Intervention;  Surgeon: Leonie Man, MD;  Location: [facility];  Service: Cardiovascular: -- DES PCI mLCx 90%--0% SYNERGY DES 2.75 MM X 16 MM.   CARDIAC CATHETERIZATION  2001   non-obstructive CAD   CATARACT EXTRACTION W/ INTRAOCULAR LENS  IMPLANT, BILATERAL  July -  August 2017   DILATION AND CURETTAGE OF UTERUS  X 3   ESOPHAGOGASTRODUODENOSCOPY  03/2009   ulcer,HP, GERD stricture   EXCISIONAL HEMORRHOIDECTOMY     HIP ARTHROPLASTY Left 2010   JOINT REPLACEMENT     KNEE ARTHROSCOPY Bilateral    LAPAROSCOPIC CHOLECYSTECTOMY  2005   LUMBAR  [place] SURGERY  11/1984; 1987; 1989; 2004; 2005 X 2   deg. disk lumber spine    MOHS SURGERY Right    "inside  my lower leg"   NASAL SINUS SURGERY  1970s   NM MYOVIEW LTD  08/28/2016   INTERMEDIATE RISK (b/c reduced EF & ? small infarct) -- EF ~35-40%.  ? Prior Small Inferior-apical & septal infarct (w/o ischemia).  Difuse HK - worse in inferoapex.  Suggest prior infarct with no ischemia. Ischemic area correlates with echo WMA   NM MYOVIEW LTD  05/27/2021   Findings consistent with prior anteroapical infarct with fixed defect.  No peri-infarct ischemia.  Notable improvement from prior study.:   ORIF FEMUR FRACTURE Right 01/04/2020   Procedure: OPEN REDUCTION INTERNAL FIXATION (ORIF) DISTAL FEMUR FRACTURE;  Surgeon: Rod Can, MD;  Location: [facility];  Service: Orthopedics;  Laterality: Right;   REVERSE SHOULDER ARTHROPLASTY Left 12/17/2017   Procedure: LEFT REVERSE SHOULDER ARTHROPLASTY;  Surgeon: Netta Cedars, MD;  Location: [facility];  Service: Orthopedics;  Laterality: Left;   TOTAL HIP ARTHROPLASTY Right 04/04/2013   Procedure: RIGHT TOTAL HIP ARTHROPLASTY ANTERIOR APPROACH;  Surgeon: Mauri Pole, MD;  Location: [facility];  Service: Orthopedics;  Laterality: Right;   TOTAL KNEE ARTHROPLASTY Right 2009   TRANSTHORACIC ECHOCARDIOGRAM  1/'18; 1/'19   a) 1/'18: mild conc LVH. EF 45-50% - anteroseptal & inferoseptal HK. Mod AI.; b) 1/'19 - post PCI: (post PCI) --> EF normalized:  Normal LV systolic function; mild diastolic dysfunction; mild   LVH; sclerotic aortic valve with mild AI.  No RWMA   TRANSTHORACIC ECHOCARDIOGRAM  06/04/2020    for TIA: Normal LV size and function.  No or WMA.  EF 60 to 65%.  GR 1 DD with elevated LAP.  Normal RV size and function.  Normal RVP, RAP.  Mild aortic valve calcification-sclerosis but no stenosis.  Trivial AI.  No embolic source   TUBAL LIGATION     VAGINAL HYSTERECTOMY  1970s   partial     PCI [name] 2018 -mid LCx 90% - 0% with Synergy DES 2.75 mm right 60 mm;  LAD FFR negative.     Immunization History  Administered Date(s) Administered   Fluad Quad(high Dose 65+) 06/30/2019, 05/06/2020   Influenza, High Dose Seasonal PF 06/30/2021   Influenza,inj,Quad PF,6+ Mos 07/17/2013, 09/12/2016, 10/12/2017, 10/25/2018   Influenza-Unspecified 05/26/2021   PFIZER(Purple Top)SARS-COV-2 Vaccination 09/14/2019, 10/05/2019, 08/13/2020   Pneumococcal Conjugate-13 10/12/2017   Pneumococcal Polysaccharide-23 01/26/2008, 04/16/2010   Td 03/28/1998, 01/26/2008   Unspecified SARS-COV-2 Vaccination 05/26/2021    MEDICATIONS/ALLERGIES   Current Meds  Medication Sig   acetaminophen (TYLENOL) 500 MG tablet Take 1,000 mg by mouth 2 (two) times daily as needed for moderate pain or headache.    albuterol (VENTOLIN HFA) 108 (90 Base) MCG/ACT inhaler INHALE 1 PUFF INTO THE LUNGS EVERY 6 (SIX) HOURS AS NEEDED (FOR COUGH).   atorvastatin (LIPITOR) 20 MG tablet TAKE 1 TABLET BY MOUTH EVERY DAY   benzonatate (TESSALON) 200 MG capsule TAKE 1 CAPSULE (200 MG TOTAL) BY MOUTH 3 (THREE) TIMES DAILY AS NEEDED FOR COUGH.   busPIRone (BUSPAR) 15 MG tablet TAKE 1 TABLET BY MOUTH EVERY DAY   cetirizine (ZYRTEC) 10 MG tablet TAKE 1 TABLET BY MOUTH EVERY DAY   clopidogrel (PLAVIX) 75 MG tablet TAKE 1 TABLET BY MOUTH EVERY DAY WITH BREAKFAST   fluticasone (FLONASE) 50 MCG/ACT nasal spray Place 1 spray into both nostrils daily as needed.   HYDROcodone-acetaminophen (NORCO/VICODIN) 5-325 MG tablet Take 1 tablet by mouth 3 (three) times daily as needed for moderate pain.   losartan (COZAAR) 25 MG tablet TAKE 1 TABLET BY MOUTH EVERY  DAY   Melatonin 3 MG CAPS Take 6 mg by mouth at bedtime.    methocarbamol (ROBAXIN) 500 MG tablet TAKE 1 TABLET BY MOUTH EVERY 8 HOURS AS NEEDED FOR MUSCLE SPASMS   metoprolol tartrate (LOPRESSOR) 25 MG tablet TAKE 1 TABLET BY MOUTH TWICE A DAY   pantoprazole (PROTONIX) 40 MG tablet TAKE 1 TABLET BY MOUTH EVERY DAY   sertraline (ZOLOFT) 25 MG tablet TAKE 1  TABLET (25 MG TOTAL) BY MOUTH DAILY.   Vitamin D, Cholecalciferol, 25 MCG (1000 UT) CAPS Take 2,000 Units by mouth in the morning and at bedtime.     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   Ace Inhibitors     cough   Hydrochlorothiazide     Hyponatremia    Oxaprozin     GI upset    Pregabalin Swelling    SWELLING REACTION UNSPECIFIED    Amoxicillin-Pot Clavulanate Nausea And Vomiting    gi upset Has patient had a PCN reaction causing immediate rash, facial/tongue/throat swelling, SOB or lightheadedness with hypotension: No Has patient had a PCN reaction causing severe rash involving mucus membranes or skin necrosis: No Has patient had a PCN reaction that required hospitalization No Has patient had a PCN reaction occurring within the last 10 years: No If all of the above answers are "NO", then may proceed with Cephalosporin use.     SOCIAL HISTORY/FAMILY HISTORY   Reviewed in Epic:  Pertinent findings:  Social History   Tobacco Use   Smoking status: Never   Smokeless tobacco: Never  Vaping Use   Vaping Use: Never used  Substance Use Topics   Alcohol use: Yes    Alcohol/week: 0.0 standard drinks of alcohol    Comment: 09/11/2016 "might have a margarita q 3-4 months"   Drug use: No   Social History   Social History Narrative   Now Widowed as of [name] 2020. Birthed 5 children. Lost one at age 12.   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   9 grandchildren     OBJCTIVE -PE, EKG, labs   Wt Readings from Last 3 Encounters:  05/13/22 178 lb 12.8 oz (81.1 kg)  03/11/22 181 lb 3.2 oz (82.2 kg)  01/29/22 183 lb 4.8 oz (83.1 kg)    Physical Exam: BP 138/60   Pulse 62   Ht 5' 2.75" (1.594 m)   Wt 178 lb 12.8 oz (81.1 kg)   SpO2 95%    BMI 31.93 kg/m  ->  Vitals:   05/13/22 1003 05/13/22 2221  BP: (!) 156/62 138/60  Pulse: 62   SpO2: 95%   Weight: 178 lb 12.8 oz (81.1 kg)   Height: 5' 2.75" (1.594 m)     Physical Exam Vitals reviewed.  Constitutional:      General: She is not in acute distress.    Appearance: Normal appearance. She is obese. She is not ill-appearing (Healthy-appearing.  Well-nourished and well-groomed.) or toxic-appearing.  HENT:     Head: Normocephalic and atraumatic.     Ears:     Comments: Very hard of hearing Neck:     Vascular: No carotid bruit or JVD.  Cardiovascular:     Rate and Rhythm: Normal rate  and regular rhythm. No extrasystoles are present.    Chest Wall: PMI is not displaced.     Pulses: Normal pulses and intact distal pulses.     Heart sounds: S1 normal and S2 normal. Heart sounds are distant. Murmur heard.     Harsh crescendo-decrescendo early systolic murmur is present with a grade of 1/6 at the upper right sternal border radiating to the neck.     No friction rub. No gallop.  Pulmonary:     Effort: Pulmonary effort is normal. No respiratory distress.     Breath sounds: Normal breath sounds. No wheezing, rhonchi or rales.  Chest:     Chest wall: No tenderness.  Musculoskeletal:        General: Swelling present. Normal range of motion.     Cervical back: Normal range of motion and neck supple.     Right lower leg: Edema (1-2+) present.     Left lower leg: Edema (Trace to 1+) present.  Skin:    General: Skin is warm and dry.  Neurological:     General: No focal deficit present.     Mental Status: She is alert and oriented to person, place, and time.     Gait: Gait abnormal.  Psychiatric:        Mood and Affect: Mood normal.        Behavior: Behavior normal.        Thought Content: Thought content normal.        Judgment: Judgment normal.     Adult ECG Report N/A  Recent Labs: Reviewed Lab Results  Component Value Date   CHOL 160 12/02/2021   HDL 41.50  12/02/2021   LDLCALC 74 10/31/2020   LDLDIRECT 75.0 12/02/2021   TRIG 212.0 (H) 12/02/2021   CHOLHDL 4 12/02/2021   Lab Results  Component Value Date   CREATININE 1.02 03/11/2022   BUN 15 03/11/2022   NA 137 03/11/2022   K 4.4 03/11/2022   CL 103 03/11/2022   CO2 27 03/11/2022      Latest Ref Rng & Units 04/23/2022    1:51 PM 01/29/2022   11:51 AM 12/02/2021    9:06 AM  CBC  WBC 4.0 - 10.5 K/uL 6.0  5.9  6.7   Hemoglobin 12.0 - 15.0 g/dL 10.7  10.4  10.6   Hematocrit 36.0 - 46.0 % 33.3  32.7  33.2   Platelets 150 - 400 K/uL 244  224  235.0     Lab Results  Component Value Date   HGBA1C 5.3 10/19/2018   Lab Results  Component Value Date   TSH 7.13 (H) 12/02/2021    ================================================== I spent a total of 27 minutes with the patient spent in direct patient consultation.  Additional time spent with chart review  / charting (studies, outside notes, etc): 24 min Total Time:51 min  Current medicines are reviewed at length with the patient today.  (+/- concerns) N/A  Notice: This dictation was prepared with Dragon dictation along with smart phrase technology. Any transcriptional errors that result from this process are unintentional and may not be corrected upon review.  Studies Ordered:   No orders of the defined types were placed in this encounter.  Meds ordered this encounter  Medications   aspirin EC 81 MG tablet    Sig: Take 1 tablet (81 mg total) by mouth daily. Start after you complete taking Clopidogrel ( Plavix) Swallow whole.    Dispense:  90 tablet    Refill:  3    Patient Instructions / Medication Changes & Studies & Tests Ordered   Patient Instructions  Medication Instructions:    Complete your bottle of Plavix  ( clopidogrel) then stop  then start Aspirin 81 mg   *If you need a refill on your cardiac medications before your next appointment, please call your pharmacy*   Lab Work:fasting  Lipid Hepatic panel can do  when you get lab done at the cancer center  If you have labs (blood work) drawn today and your tests are completely normal, you will receive your results only by: MyChart Message (if you have MyChart) OR A paper copy in the mail If you have any lab test that is abnormal or we need to change your treatment, we will call you to review the results.   Testing/Procedures:  Not needed  Follow-Up: At [facility], you and your health needs are our priority.  As part of our continuing mission to provide you with exceptional heart care, we have created designated Provider Care Teams.  These Care Teams include your primary Cardiologist (physician) and Advanced Practice Providers (APPs -  Physician Assistants and Nurse Practitioners) who all work together to provide you with the care you need, when you need it.   Your next appointment:   6 month(s)  The format for your next appointment:   In Person  Provider:   Glenetta Hew, MD    Other Instructions  None   Leonie Man, MD, MS Glenetta Hew, M.D., M.S. Interventional Cardiologist  [facility]  Pager # [phone] Phone # [phone] [address] [place], [place] [address]   Thank you for choosing [facility]!!

## 2022-05-23 ENCOUNTER — Other Ambulatory Visit: Payer: Self-pay | Admitting: Family Medicine

## 2022-05-23 ENCOUNTER — Other Ambulatory Visit: Payer: Self-pay | Admitting: Cardiology

## 2022-05-24 ENCOUNTER — Encounter: Payer: Self-pay | Admitting: Cardiology

## 2022-05-24 NOTE — Assessment & Plan Note (Signed)
Labs were pretty close for the last 2 checks with LDL of 74 and 75.  I do think that if we switched him from atorvastatin 20 mg to rosuvastatin 20 mg daily she would probably get clear.  I would like to see what her follow-up labs look like, if not yet at goal we will switch.

## 2022-05-24 NOTE — Assessment & Plan Note (Signed)
5-1/2 years out from PCI.  No active bleeding issues but she does have some bruising.  I think with her balance of concern at the knee would like to be little less aggressive with antiplatelet agent.  Plan: DC Plavix, convert to 81 mg aspirin.  Okay to hold aspirin 7 days preop for surgery or procedures.

## 2022-05-24 NOTE — Assessment & Plan Note (Signed)
She has This chronic anemia this being followed by PCP.  Could easily be related to some of her dyspnea and fatigue.   Would like to see what happens if we switch from Plavix to aspirin.

## 2022-05-24 NOTE — Assessment & Plan Note (Signed)
Known CAD with PCI of the LCx and moderate LAD disease not flow-limiting by FFR.  Myoview last year was nonischemic, indicating that her chronic dyspnea is not related to coronary ischemia at least from macrovascular disease.  Plan:  Switching from Plavix to aspirin as noted-okay to hold aspirin for procedures or surgeries.  On stable moderate dose Lopressor.  25 mg twice daily  On low-dose losartan 25 mg daily.  On modest dose atorvastatin 20 mg daily-LDL still not quite at goal, but close.

## 2022-05-24 NOTE — Assessment & Plan Note (Signed)
Fairly well controlled.  Initial pressure was high but better on recheck.  Was 134/86 at PCPs office in July.  This is more consistent with what I got on recheck here today.  Would not want tissue for overly aggressive blood pressure control-likely more based on previous guidelines and the guidelines of 130/80 mmHg.  Continue current dose of losartan and metoprolol tartrate.  No change for now. She does pretty well with lifestyle-low-salt diet,.  She could try to get more exercise

## 2022-06-26 ENCOUNTER — Telehealth: Payer: Self-pay | Admitting: Family Medicine

## 2022-06-26 NOTE — Telephone Encounter (Signed)
Patient daughter Dola Argyle called in and stated her mom [name] [name] had fell and hit her head. She stated that she is fine but has a big knot on the back of head. She was wanting to know what she should do or look out for. Sent over to [facility].

## 2022-06-26 NOTE — Telephone Encounter (Signed)
Pt on speaker phone with Suzi and other children present. Pt fell straight back and hit the back of her head on the floor this morning while opening the door for family member @ approximately 11am.  Pt did not have a loss of consciousness but does report a "goose egg" on the back of her head.  Pt was able to answer orientation questions and is A/O x 4 at time of phone call. Pt is not home alone, 4 of her children are with her at the time of phone call.  Encouraged pt to apply ice to swollen area 20 minutes on and 20 minutes off for the next 24 hrs.  If pt experiences loss of consciousness, confusion or vomiting take pt immediately to the hospital for further testing.  Family asked if they were supposed to keep the pt awake for 24 hrs, writer let them know that if the patient is sleepy at appropriate times that is not cause for concern, her body needs to rest to heal from the event but if pt is unusually sleepy or un-arousalable  that would be a reason to call 911. Pt and children verbalize understanding. Message routed to Dr. Glori Bickers and Rexene Agent, [place]

## 2022-06-26 NOTE — Telephone Encounter (Signed)
She takes asa but no blood thinners which is good, as long as they know to watch for headache, dizziness, ms change, trouble concentrating, nausea or any other neuro changes /concussion symptoms  Thanks

## 2022-06-29 ENCOUNTER — Other Ambulatory Visit: Payer: Self-pay | Admitting: Cardiology

## 2022-07-03 ENCOUNTER — Emergency Department (HOSPITAL_BASED_OUTPATIENT_CLINIC_OR_DEPARTMENT_OTHER)
Admission: EM | Admit: 2022-07-03 | Discharge: 2022-07-03 | Disposition: A | Discharge status: Home / Self Care | Payer: Medicare Other | Attending: Emergency Medicine | Admitting: Emergency Medicine

## 2022-07-03 ENCOUNTER — Encounter (HOSPITAL_BASED_OUTPATIENT_CLINIC_OR_DEPARTMENT_OTHER): Payer: Self-pay | Admitting: Emergency Medicine

## 2022-07-03 ENCOUNTER — Emergency Department (HOSPITAL_BASED_OUTPATIENT_CLINIC_OR_DEPARTMENT_OTHER): Payer: Medicare Other

## 2022-07-03 DIAGNOSIS — Z85828 Personal history of other malignant neoplasm of skin: Secondary | ICD-10-CM | POA: Insufficient documentation

## 2022-07-03 DIAGNOSIS — Z7982 Long term (current) use of aspirin: Secondary | ICD-10-CM | POA: Insufficient documentation

## 2022-07-03 DIAGNOSIS — S0990XA Unspecified injury of head, initial encounter: Secondary | ICD-10-CM | POA: Insufficient documentation

## 2022-07-03 DIAGNOSIS — Z79899 Other long term (current) drug therapy: Secondary | ICD-10-CM | POA: Diagnosis not present

## 2022-07-03 DIAGNOSIS — I2510 Atherosclerotic heart disease of native coronary artery without angina pectoris: Secondary | ICD-10-CM | POA: Insufficient documentation

## 2022-07-03 DIAGNOSIS — I10 Essential (primary) hypertension: Secondary | ICD-10-CM | POA: Insufficient documentation

## 2022-07-03 DIAGNOSIS — Y92000 Kitchen of unspecified non-institutional (private) residence as  the place of occurrence of the external cause: Secondary | ICD-10-CM | POA: Insufficient documentation

## 2022-07-03 DIAGNOSIS — W19XXXA Unspecified fall, initial encounter: Secondary | ICD-10-CM

## 2022-07-03 DIAGNOSIS — W109XXA Fall (on) (from) unspecified stairs and steps, initial encounter: Secondary | ICD-10-CM | POA: Insufficient documentation

## 2022-07-03 NOTE — Telephone Encounter (Signed)
Patient called and stated she has a brusie on her head from the fall on Friday, Patient was sent to access nurse.

## 2022-07-03 NOTE — Telephone Encounter (Signed)
Aware, will watch for correspondence  

## 2022-07-03 NOTE — Discharge Instructions (Addendum)
Your exam today was reassuring.  CT scan of the head and neck did not show any concerning findings.  If you notice any concerning symptoms such as persistent nausea and vomiting, severe headache please return for evaluation.  Continue using ice over your hematoma.

## 2022-07-03 NOTE — ED Provider Notes (Signed)
[facility] EMERGENCY DEPT Provider Note   CSN: [id number] Arrival date & time: 07/03/22  1059     History  Chief Complaint  Patient presents with   Lytle Michaels    [name] [name] is a 81 y.o. female.  81 year old female presents today for evaluation of head injury that occurred about 1 week ago.  She fell backwards in her kitchen.  She states there are 3 stairs leading into the kitchen with a door.  The door was closed.  She states that she opened the door her grandkid was standing on the other side causing her to fall backwards.  She denies loss of consciousness.  She is on Plavix otherwise no blood thinner.  She denies any other injury.  She was assisted up by her daughter and son-in-law.  She has been ambulatory without difficulty since then.  Denies headache, nausea, vomiting or other complaints.  Daughter is at bedside.  Patient went to get her hair done today and noticed she had bruising on her neck.  She states she did have a hematoma to the back of her scalp which has significantly improved in size.  The history is provided by the patient and a relative. No language interpreter was used.       Home Medications Prior to Admission medications   Medication Sig Start Date End Date Taking? Authorizing Provider  acetaminophen (TYLENOL) 500 MG tablet Take 1,000 mg by mouth 2 (two) times daily as needed for moderate pain or headache.     [provider]  albuterol (VENTOLIN HFA) 108 (90 Base) MCG/ACT inhaler INHALE 1 PUFF INTO THE LUNGS EVERY 6 (SIX) HOURS AS NEEDED (FOR COUGH). 02/04/21   Tower, [name], MD  aspirin EC 81 MG tablet Take 1 tablet (81 mg total) by mouth daily. Start after you complete taking Clopidogrel ( Plavix) Swallow whole. 05/13/22   Leonie Man, MD  atorvastatin (LIPITOR) 20 MG tablet TAKE 1 TABLET BY MOUTH EVERY DAY 05/25/22   Leonie Man, MD  benzonatate (TESSALON) 200 MG capsule TAKE 1 CAPSULE (200 MG TOTAL) BY MOUTH 3 (THREE) TIMES  DAILY AS NEEDED FOR COUGH. 02/23/22   Waunita Schooner, MD  busPIRone (BUSPAR) 15 MG tablet TAKE 1 TABLET BY MOUTH EVERY DAY 01/13/22   Tower, [name], MD  cetirizine (ZYRTEC) 10 MG tablet TAKE 1 TABLET BY MOUTH EVERY DAY 04/08/22   Tower, [name], MD  fluticasone (FLONASE) 50 MCG/ACT nasal spray PLACE 1 SPRAY INTO BOTH NOSTRILS DAILY AS NEEDED. 05/25/22   Tower, [name], MD  HYDROcodone-acetaminophen (NORCO/VICODIN) 5-325 MG tablet Take 1 tablet by mouth 3 (three) times daily as needed for moderate pain. 03/08/20   Nicholas Lose, MD  losartan (COZAAR) 25 MG tablet TAKE 1 TABLET BY MOUTH EVERY DAY 04/10/22   Tower, [name], MD  Melatonin 3 MG CAPS Take 6 mg by mouth at bedtime.     [provider]  methocarbamol (ROBAXIN) 500 MG tablet TAKE 1 TABLET BY MOUTH EVERY 8 HOURS AS NEEDED FOR MUSCLE SPASMS 05/04/22   Tower, [name], MD  metoprolol tartrate (LOPRESSOR) 25 MG tablet TAKE 1 TABLET BY MOUTH TWICE A DAY 02/20/22   Leonie Man, MD  nitroGLYCERIN (NITROSTAT) 0.4 MG SL tablet Place 1 tablet (0.4 mg total) under the tongue every 5 (five) minutes as needed. Patient not taking: Reported on 05/13/2022 11/07/21 02/05/22  Leonie Man, MD  nystatin (MYCOSTATIN/NYSTOP) powder Apply topically 2 (two) times daily as needed. To affected  areas to prevent yeast (in skin folds) Patient not taking: Reported on 05/13/2022 07/23/20   Abner Greenspan, MD  pantoprazole (PROTONIX) 40 MG tablet TAKE 1 TABLET BY MOUTH EVERY DAY 06/29/22   Leonie Man, MD  sertraline (ZOLOFT) 25 MG tablet TAKE 1 TABLET (25 MG TOTAL) BY MOUTH DAILY. 12/19/21   Tower, [name], MD  Vitamin D, Cholecalciferol, 25 MCG (1000 UT) CAPS Take 2,000 Units by mouth in the morning and at bedtime.     [provider]      Allergies    Sulfonamide derivatives, Ace inhibitors, Hydrochlorothiazide, Oxaprozin, Pregabalin, and Amoxicillin-pot clavulanate    Review of Systems   Review of Systems  Constitutional:  Negative for chills and  fever.  Eyes:  Negative for visual disturbance.  Musculoskeletal:  Negative for arthralgias and gait problem.  Neurological:  Negative for syncope and headaches.  All other systems reviewed and are negative.   Physical Exam Updated Vital Signs BP (!) 155/69 (BP Location: Right Arm)   Pulse 62   Temp 97.9 F (36.6 C)   Resp 16   Ht 5' 3.75" (1.619 m)   Wt 81.6 kg   SpO2 99%   BMI 31.14 kg/m  Physical Exam Vitals and nursing note reviewed.  Constitutional:      General: She is not in acute distress.    Appearance: Normal appearance. She is not ill-appearing.  HENT:     Head: Normocephalic and atraumatic.     Nose: Nose normal.  Eyes:     Conjunctiva/sclera: Conjunctivae normal.  Pulmonary:     Effort: Pulmonary effort is normal. No respiratory distress.  Musculoskeletal:        General: No deformity.     Comments: Cervical, thoracic, lumbar spine without tenderness palpation.  Bruising noted around the neck,.  Hematoma noted to posterior scalp.  Mild bruising over the hematoma.  Pupils equal round reactive to light.  Full range of motion bilateral upper and lower extremities with good strength.  Skin:    Findings: No rash.  Neurological:     Mental Status: She is alert.     ED Results / Procedures / Treatments   Labs (all labs ordered are listed, but only abnormal results are displayed) Labs Reviewed - No data to display  EKG None  Radiology CT Head Wo Contrast  Result Date: 07/03/2022 CLINICAL DATA:  Fall 1 week ago and hit head. EXAM: CT HEAD WITHOUT CONTRAST CT CERVICAL SPINE WITHOUT CONTRAST TECHNIQUE: Multidetector CT imaging of the head and cervical spine was performed following the standard protocol without intravenous contrast. Multiplanar CT image reconstructions of the cervical spine were also generated. RADIATION DOSE REDUCTION: This exam was performed according to the departmental dose-optimization program which includes automated exposure control,  adjustment of the mA and/or kV according to patient size and/or use of iterative reconstruction technique. COMPARISON:  Brain MRI 06/04/2020, CT head 06/03/2020 FINDINGS: CT HEAD FINDINGS Brain: There is no acute intracranial hemorrhage, extra-axial fluid collection, or acute infarct. Parenchymal volume is normal. The ventricles are normal in size. Gray-white differentiation is preserved There is no mass lesion.  There is no mass effect or midline shift. Vascular: There is calcification of the bilateral carotid siphons. Skull: Normal. Negative for fracture or focal lesion. Sinuses/Orbits: There is hyperostosis of the right maxillary sinus with mild mucosal thickening consistent with chronic sinusitis. Bilateral lens implants are in place. The globes and orbits are otherwise unremarkable. Other: There is large midline posterior scalp hematoma.  CT CERVICAL SPINE FINDINGS Alignment: Normal. There is no antero or retrolisthesis. There is no jumped or perched facet or other evidence of traumatic malalignment. Skull base and vertebrae: Skull base alignment is maintained. Vertebral body heights are preserved. There is no evidence of acute fracture. There is osseous fusion across the C4-C5 facet joints bilaterally. Soft tissues and spinal canal: No prevertebral fluid or swelling. No visible canal hematoma. Disc levels: There is multilevel disc space narrowing and degenerative endplate change throughout the cervical spine. Facet arthropathy is most advanced C3-C4 and C4-C5. There is no evidence of high-grade spinal canal stenosis. Upper chest: The imaged lung apices are clear. Other: None. IMPRESSION: 1. No acute intracranial pathology. 2. Large midline posterior scalp hematoma without underlying calvarial fracture. 3. No acute fracture or traumatic malalignment of the cervical spine. Electronically Signed   By: Valetta Mole M.D.   On: 07/03/2022 12:32   CT Cervical Spine Wo Contrast  Result Date: 07/03/2022 CLINICAL  DATA:  Fall 1 week ago and hit head. EXAM: CT HEAD WITHOUT CONTRAST CT CERVICAL SPINE WITHOUT CONTRAST TECHNIQUE: Multidetector CT imaging of the head and cervical spine was performed following the standard protocol without intravenous contrast. Multiplanar CT image reconstructions of the cervical spine were also generated. RADIATION DOSE REDUCTION: This exam was performed according to the departmental dose-optimization program which includes automated exposure control, adjustment of the mA and/or kV according to patient size and/or use of iterative reconstruction technique. COMPARISON:  Brain MRI 06/04/2020, CT head 06/03/2020 FINDINGS: CT HEAD FINDINGS Brain: There is no acute intracranial hemorrhage, extra-axial fluid collection, or acute infarct. Parenchymal volume is normal. The ventricles are normal in size. Gray-white differentiation is preserved There is no mass lesion.  There is no mass effect or midline shift. Vascular: There is calcification of the bilateral carotid siphons. Skull: Normal. Negative for fracture or focal lesion. Sinuses/Orbits: There is hyperostosis of the right maxillary sinus with mild mucosal thickening consistent with chronic sinusitis. Bilateral lens implants are in place. The globes and orbits are otherwise unremarkable. Other: There is large midline posterior scalp hematoma. CT CERVICAL SPINE FINDINGS Alignment: Normal. There is no antero or retrolisthesis. There is no jumped or perched facet or other evidence of traumatic malalignment. Skull base and vertebrae: Skull base alignment is maintained. Vertebral body heights are preserved. There is no evidence of acute fracture. There is osseous fusion across the C4-C5 facet joints bilaterally. Soft tissues and spinal canal: No prevertebral fluid or swelling. No visible canal hematoma. Disc levels: There is multilevel disc space narrowing and degenerative endplate change throughout the cervical spine. Facet arthropathy is most advanced  C3-C4 and C4-C5. There is no evidence of high-grade spinal canal stenosis. Upper chest: The imaged lung apices are clear. Other: None. IMPRESSION: 1. No acute intracranial pathology. 2. Large midline posterior scalp hematoma without underlying calvarial fracture. 3. No acute fracture or traumatic malalignment of the cervical spine. Electronically Signed   By: Valetta Mole M.D.   On: 07/03/2022 12:32    Procedures Procedures    Medications Ordered in ED Medications - No data to display  ED Course/ Medical Decision Making/ A&P                           Medical Decision Making Amount and/or Complexity of Data Reviewed Radiology: ordered.   Medical Decision Making / ED Course   This patient presents to the ED for concern of head injury, this involves an extensive  number of treatment options, and is a complaint that carries with it a high risk of complications and morbidity.  The differential diagnosis includes acute intracranial bleed, hematoma, posttraumatic headache, cervical fracture  MDM: 81 year old female presents today for above-mentioned complaint.  Fall occurred 1 week ago.  She is on Plavix no other blood thinners.  No loss of consciousness during this fall.  She was on the floor for about 5 minutes prior to being assisted up.  She has been ambulatory and otherwise at baseline since the fall.  Today she no bruising to her neck prompting her to call her primary care provider.  They asked her if she was on blood thinners and she told them yes and they recommended she come to the ER for further evaluation.  She does have bruising to her neck, and a hematoma to her posterior scalp with mild bruising.  She states the hematoma has improved in size.  CT scan of the head, neck is unremarkable with the exception of the posterior scalp hematoma.  No intracranial injury noted.  Full range of motion all extremities.  Patient is ambulatory without difficulty.  I did witness patient ambulating.   Patient is appropriate for discharge.  Discharged in stable condition.  Return precautions discussed.  Lab Tests: -I ordered, reviewed, and interpreted labs.   The pertinent results include:   Labs Reviewed - No data to display    EKG  EKG Interpretation  Date/Time:    Ventricular Rate:    PR Interval:    QRS Duration:   QT Interval:    QTC Calculation:   R Axis:     Text Interpretation:           Imaging Studies ordered: I ordered imaging studies including CT head, CT cervical spine I independently visualized and interpreted imaging. I agree with the radiologist interpretation   Medicines ordered and prescription drug management: No orders of the defined types were placed in this encounter.   -I have reviewed the patients home medicines and have made adjustments as needed  Reevaluation: After the interventions noted above, I reevaluated the patient and found that they have :stayed the same  Co morbidities that complicate the patient evaluation  Past Medical History:  Diagnosis Date   Anemia    of chronic disease   Anxiety    Arthritis    "psoratic arthritis; new dx" (09/11/2016)   CAD S/P percutaneous coronary angioplasty 08/2016   mLCx 90% -> 0% (2.75 mm x 16 mm) Synergy DES PCI Ostial D1 70% (not PTCA target). Moderate ostial and mD2.  LAD tandem 50% stenoses w/ negative FFR (0.86)   Cancer (HCC)    skin cancer on leg   Chronic lower back pain    Complication of anesthesia 1980 and 1982   trouble waking up after breast biopsy, many surgeries since no problem   Depression    Dyspnea on effort - CHRONIC    partial relief of Sx post PCI of L Circumflex.   Fibrocystic breast    GERD (gastroesophageal reflux disease)    Headache    "monthly" (09/11/2016)   History of blood transfusion    "several since age 77; I've had them after most of my surgeries" (09/11/2016)   History of myocardial infarction     - Myoview [name] 2018 indicated small Inferior -  Inferoseptal Infarct (vs. rest ischemia). Echo with inferoseptal hypokinesis.   Hx of skin cancer, basal cell 2013 and 2012   right inner, lower  leg; several scattered around my body   Hypertension    Iron deficiency anemia    "I've had an iron infusion"   Osteoarthritis    Osteopenia    Peptic ulcer disease    H pylori   PMR (polymyalgia rheumatica) (HCC)    PONV (postoperative nausea and vomiting)    Psoriasis    TIA (transient ischemic attack) 06/03/2020   Symptoms of right arm weakness/numbness and slurred speech as well as right droopm => MRI brain/MRA head normal.  Normal echo normal carotid Dopplers.      Dispostion: Patient is appropriate for discharge.  Discharged in stable condition.  Return precautions discussed.  Imaging negative for any concerning findings.   Final Clinical Impression(s) / ED Diagnoses Final diagnoses:  Fall, initial encounter  Injury of head, initial encounter    Rx / DC Orders ED Discharge Orders     None         Evlyn Courier, PA-C 07/03/22 [place], MD 07/05/22 1359

## 2022-07-03 NOTE — ED Triage Notes (Signed)
Fell backward a week ago and hit her head. Takes plavix. She got her hair done and they noticed bruising to the top of her neck. Denies pain.

## 2022-07-03 NOTE — Telephone Encounter (Addendum)
Pr access nurse note pt complied with access disposition for pt to go to ED. Sending note to Dr Glori Bickers and [employer].    [facility] - Client TELEPHONE ADVICE RECORD AccessNurse Patient Name: [name] [name] Gender: Female DOB: 1941-08-17 Age: 81 Y 47 M 37 D Return Phone Number: [phone] (Primary), [phone] (Secondary) Address: [name]  [address] Client [facility] - Client Client Site [place] Provider Glori Bickers, Roque Lias - MD Contact Type Call Who Is Calling Patient / Member / Family / Caregiver Call Type Triage / Clinical Caller Name [name] Relationship To Patient Self Return Phone Number [phone] (Primary) Chief Complaint HEAD INJURY - and not acting right. Change in behaviour after hitting head. Reason for Call Symptomatic / Request for Health Information Initial Comment Caller states she fell last week and while getting her done today they seen a spot on the back of her head that look like a bruised, it also is all over her neck, wanted to make sure if was not a blood clot. Translation No Nurse Assessment Nurse: Kirk Ruths, RN, [facility] Date/Time (Eastern Time): 07/03/2022 10:30:27 AM Confirm and document reason for call. If symptomatic, describe symptoms. ---Caller states patient fell last week while getting her hair done they spotted a iknot on the base of her neck with bruising it had not been notice before no s;s was evaluated by the office no need for being seen were found concussion s/s were negative butthe bruising has her concerned no additional injuries color of the bruising is purple and orange/yellow like older bruising no headache not tender to touch no pain with moving neck Does the patient have any new or worsening symptoms? ---No Guidelines Guideline Title Affirmed Question Affirmed Notes Nurse Date/Time (Eastern Time) Bruises [1] Bruise  on head, face, chest, or abdomen AND [2] taking Coumadin (warfarin) or other strong blood thinner, or known bleeding disorder (e.g., thrombocytopenia) Kirk Ruths, RN, [facility] 07/03/2022 10:35:02 AM PLEASE NOTE: All timestamps contained within this report are represented as [place] Standard Time. CONFIDENTIALTY NOTICE: This fax transmission is intended only for the addressee. It contains information that is legally privileged, confidential or otherwise protected from use or disclosure. If you are not the intended recipient, you are strictly prohibited from reviewing, disclosing, copying using or disseminating any of this information or taking any action in reliance on or regarding this information. If you have received this fax in error, please notify us immediately by telephone so that we can arrange for its return to [name]. Phone: [phone], Toll-Free: [phone], Fax: [phone] Page: 2 of 2 Call Id: [id number] [place]. Time Eilene Ghazi Time) Disposition Final User 07/03/2022 10:25:25 AM Send to Urgent Ronie Spies, [place] 07/03/2022 10:37:50 AM Go to ED Now (or PCP triage) Yes Kirk Ruths, RN, [facility] Final Disposition 07/03/2022 10:37:50 AM Go to ED Now (or PCP triage) Yes Kirk Ruths, RN, Liana Gerold Disagree/Comply Comply Caller Understands Yes PreDisposition Call Doctor Care Advice Given Per Guideline GO TO ED NOW (OR PCP TRIAGE): * IF NO PCP (PRIMARY CARE PROVIDER) SECOND-LEVEL TRIAGE: You need to be seen within the next hour. Go to the [facility] at _____________ [place] as soon as you can. CARE ADVICE given per Bruises (Adult) guideline. Referrals [facility] - E

## 2022-07-13 ENCOUNTER — Other Ambulatory Visit: Payer: Self-pay | Admitting: Family Medicine

## 2022-07-14 NOTE — Telephone Encounter (Signed)
  Encourage patient to contact the pharmacy for refills or they can request refills through [facility]:  Please schedule appointment if longer than 1 year  NEXT APPOINTMENT DATE:  MEDICATION:  busPIRone (BUSPAR) 15 MG tablet  Is the patient out of medication?   PHARMACY: [id number]- [place], [place] - [address]    Let patient know to contact pharmacy at the end of the day to make sure medication is ready.  Please notify patient to allow 48-72 hours to process  CLINICAL FILLS OUT ALL BELOW:   LAST REFILL:  QTY:  REFILL DATE:    OTHER COMMENTS:    Okay for refill?  Please advise

## 2022-07-14 NOTE — Progress Notes (Signed)
Patient Care Team: Tower, [name], MD as PCP - General Ellyn Hack Leonie Green, MD as PCP - Cardiology (Cardiology) Suella Broad, MD as Consulting Physician (Physical Medicine and Rehabilitation) Gavin Pound, MD as Consulting Physician (Rheumatology) Haverstock, Jennefer Bravo, MD as Referring Physician (Dermatology) Nicholas Lose, MD as Consulting Physician (Hematology and Oncology)  DIAGNOSIS: No diagnosis found.  SUMMARY OF ONCOLOGIC HISTORY: Oncology History   No history exists.    CHIEF COMPLIANT:   INTERVAL HISTORY: [name] [name] is a   ALLERGIES:  is allergic to sulfonamide derivatives, ace inhibitors, hydrochlorothiazide, oxaprozin, pregabalin, and amoxicillin-pot clavulanate.  MEDICATIONS:  Current Outpatient Medications  Medication Sig Dispense Refill   acetaminophen (TYLENOL) 500 MG tablet Take 1,000 mg by mouth 2 (two) times daily as needed for moderate pain or headache.      albuterol (VENTOLIN HFA) 108 (90 Base) MCG/ACT inhaler INHALE 1 PUFF INTO THE LUNGS EVERY 6 (SIX) HOURS AS NEEDED (FOR COUGH). 6.7 each 1   aspirin EC 81 MG tablet Take 1 tablet (81 mg total) by mouth daily. Start after you complete taking Clopidogrel ( Plavix) Swallow whole. 90 tablet 3   atorvastatin (LIPITOR) 20 MG tablet TAKE 1 TABLET BY MOUTH EVERY DAY 30 tablet 11   benzonatate (TESSALON) 200 MG capsule TAKE 1 CAPSULE (200 MG TOTAL) BY MOUTH 3 (THREE) TIMES DAILY AS NEEDED FOR COUGH. 90 capsule 1   busPIRone (BUSPAR) 15 MG tablet TAKE 1 TABLET BY MOUTH EVERY DAY 90 tablet 1   cetirizine (ZYRTEC) 10 MG tablet TAKE 1 TABLET BY MOUTH EVERY DAY 90 tablet 2   fluticasone (FLONASE) 50 MCG/ACT nasal spray PLACE 1 SPRAY INTO BOTH NOSTRILS DAILY AS NEEDED. 48 mL 1   HYDROcodone-acetaminophen (NORCO/VICODIN) 5-325 MG tablet Take 1 tablet by mouth 3 (three) times daily as needed for moderate pain. 30 tablet 0   losartan (COZAAR) 25 MG tablet TAKE 1 TABLET BY MOUTH EVERY DAY 90 tablet 3   Melatonin 3 MG  CAPS Take 6 mg by mouth at bedtime.      methocarbamol (ROBAXIN) 500 MG tablet TAKE 1 TABLET BY MOUTH EVERY 8 HOURS AS NEEDED FOR MUSCLE SPASMS 90 tablet 2   metoprolol tartrate (LOPRESSOR) 25 MG tablet TAKE 1 TABLET BY MOUTH TWICE A DAY 180 tablet 1   nitroGLYCERIN (NITROSTAT) 0.4 MG SL tablet Place 1 tablet (0.4 mg total) under the tongue every 5 (five) minutes as needed. (Patient not taking: Reported on 05/13/2022) 25 tablet 3   nystatin (MYCOSTATIN/NYSTOP) powder Apply topically 2 (two) times daily as needed. To affected areas to prevent yeast (in skin folds) (Patient not taking: Reported on 05/13/2022) 30 g 1   pantoprazole (PROTONIX) 40 MG tablet TAKE 1 TABLET BY MOUTH EVERY DAY 90 tablet 3   sertraline (ZOLOFT) 25 MG tablet TAKE 1 TABLET (25 MG TOTAL) BY MOUTH DAILY. 90 tablet 3   Vitamin D, Cholecalciferol, 25 MCG (1000 UT) CAPS Take 2,000 Units by mouth in the morning and at bedtime.      No current facility-administered medications for this visit.    PHYSICAL EXAMINATION: ECOG PERFORMANCE STATUS: {CHL ONC ECOG [id number]}  There were no vitals filed for this visit. There were no vitals filed for this visit.  BREAST:*** No palpable masses or nodules in either right or left breasts. No palpable axillary supraclavicular or infraclavicular adenopathy no breast tenderness or nipple discharge. (exam performed in the presence of a chaperone)  LABORATORY DATA:  I have reviewed the data as listed  Latest Ref Rng & Units 03/11/2022   12:55 PM 12/02/2021    9:06 AM 04/17/2021    1:20 PM  CMP  Glucose 70 - 99 mg/dL 82  77  118   BUN 6 - 23 mg/dL '15  17  23   '$ Creatinine 0.40 - 1.20 mg/dL 1.02  0.98  1.19   Sodium 135 - 145 mEq/L 137  142  139   Potassium 3.5 - 5.1 mEq/L 4.4  4.6  4.4   Chloride 96 - 112 mEq/L 103  107  107   CO2 19 - 32 mEq/L '27  29  25   '$ Calcium 8.4 - 10.5 mg/dL 9.0  8.8  9.2   Total Protein 6.0 - 8.3 g/dL 7.1  6.4  7.2   Total Bilirubin 0.2 - 1.2 mg/dL 0.4  0.3   0.4   Alkaline Phos 39 - 117 U/L 66  67  69   AST 0 - 37 U/L '22  19  16   '$ ALT 0 - 35 U/L '13  15  11     '$ Lab Results  Component Value Date   WBC 6.0 04/23/2022   HGB 10.7 (L) 04/23/2022   HCT 33.3 (L) 04/23/2022   MCV 89.8 04/23/2022   PLT 244 04/23/2022   NEUTROABS 3.0 04/23/2022    ASSESSMENT & PLAN:  No problem-specific Assessment & Plan notes found for this encounter.    No orders of the defined types were placed in this encounter.  The patient has a good understanding of the overall plan. she agrees with it. she will call with any problems that may develop before the next visit here. Total time spent: 30 mins including face to face time and time spent for planning, charting and co-ordination of care   Suzzette Righter, [place] 07/14/22    I Gardiner Coins am scribing for Dr. Lindi Adie  ***

## 2022-07-15 ENCOUNTER — Inpatient Hospital Stay: Payer: Medicare Other | Attending: Hematology and Oncology | Admitting: Hematology and Oncology

## 2022-07-15 ENCOUNTER — Inpatient Hospital Stay: Payer: Medicare Other

## 2022-07-15 ENCOUNTER — Other Ambulatory Visit: Payer: Self-pay | Admitting: Family Medicine

## 2022-07-15 ENCOUNTER — Other Ambulatory Visit: Payer: Self-pay

## 2022-07-15 VITALS — BP 201/84 | HR 63 | Temp 97.5°F | Resp 18 | Ht 63.75 in | Wt 179.7 lb

## 2022-07-15 VITALS — BP 177/73

## 2022-07-15 DIAGNOSIS — Z79899 Other long term (current) drug therapy: Secondary | ICD-10-CM | POA: Diagnosis not present

## 2022-07-15 DIAGNOSIS — D638 Anemia in other chronic diseases classified elsewhere: Secondary | ICD-10-CM

## 2022-07-15 DIAGNOSIS — Z7982 Long term (current) use of aspirin: Secondary | ICD-10-CM | POA: Insufficient documentation

## 2022-07-15 DIAGNOSIS — Z7902 Long term (current) use of antithrombotics/antiplatelets: Secondary | ICD-10-CM | POA: Diagnosis not present

## 2022-07-15 DIAGNOSIS — M353 Polymyalgia rheumatica: Secondary | ICD-10-CM | POA: Diagnosis present

## 2022-07-15 DIAGNOSIS — D649 Anemia, unspecified: Secondary | ICD-10-CM

## 2022-07-15 LAB — IRON AND IRON BINDING CAPACITY (CC-WL,HP ONLY)
Iron: 69 ug/dL (ref 28–170)
Saturation Ratios: 23 % (ref 10.4–31.8)
TIBC: 305 ug/dL (ref 250–450)
UIBC: 236 ug/dL (ref 148–442)

## 2022-07-15 LAB — CBC WITH DIFFERENTIAL (CANCER CENTER ONLY)
Abs Immature Granulocytes: 0.02 10*3/uL (ref 0.00–0.07)
Basophils Absolute: 0 10*3/uL (ref 0.0–0.1)
Basophils Relative: 1 %
Eosinophils Absolute: 0.1 10*3/uL (ref 0.0–0.5)
Eosinophils Relative: 2 %
HCT: 31.4 % — ABNORMAL LOW (ref 36.0–46.0)
Hemoglobin: 9.9 g/dL — ABNORMAL LOW (ref 12.0–15.0)
Immature Granulocytes: 0 %
Lymphocytes Relative: 34 %
Lymphs Abs: 1.9 10*3/uL (ref 0.7–4.0)
MCH: 28.8 pg (ref 26.0–34.0)
MCHC: 31.5 g/dL (ref 30.0–36.0)
MCV: 91.3 fL (ref 80.0–100.0)
Monocytes Absolute: 0.4 10*3/uL (ref 0.1–1.0)
Monocytes Relative: 7 %
Neutro Abs: 3.1 10*3/uL (ref 1.7–7.7)
Neutrophils Relative %: 56 %
Platelet Count: 237 10*3/uL (ref 150–400)
RBC: 3.44 MIL/uL — ABNORMAL LOW (ref 3.87–5.11)
RDW: 13.1 % (ref 11.5–15.5)
WBC Count: 5.5 10*3/uL (ref 4.0–10.5)
nRBC: 0 % (ref 0.0–0.2)

## 2022-07-15 LAB — VITAMIN B12: Vitamin B-12: 346 pg/mL (ref 180–914)

## 2022-07-15 MED ORDER — EPOETIN ALFA-EPBX 40000 UNIT/ML IJ SOLN
40000.0000 [IU] | Freq: Once | INTRAMUSCULAR | Status: AC
  Administered 2022-07-15: 40000 [IU] via SUBCUTANEOUS
  Filled 2022-07-15: qty 1

## 2022-07-15 NOTE — Telephone Encounter (Signed)
Last OV was on 03/11/22 for leg swelling, last filled on, 05/04/22 #90 tabs/ 2 refills

## 2022-07-15 NOTE — Telephone Encounter (Signed)
Is she out of refills ? Had 90 with 2 refills ?

## 2022-07-15 NOTE — Progress Notes (Signed)
Ok to give retacrit w/ SBP of 177 mmHg per MD Gudena.  Larene Beach, PharmD

## 2022-07-15 NOTE — Assessment & Plan Note (Addendum)
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    Lab review  10/12/2017: Hemoglobin 10.2, MCV 94, normal WBC and platelet counts, creatinine 1.02 04/13/2019: Hemoglobin 9 08/24/2019: Hemoglobin 10.2 02/08/2020: hemoglobin 10.2 03/08/20: Hemoglobin 9.9 05/02/2020: Hemoglobin 10.2 06/03/20: Hb 11.2 (did not receive Retacrit) 06/27/2020: Hemoglobin 10.3 (receiving Retacrit injection) 10/15/2020: Hemoglobin 10.2 (receiving Retacrit) 04/17/2021: Hemoglobin 10.6 (receiving Retacrit) now planning to do the injection every 3 months  07/21/21: hemoglobin: 9.9 (receiving Retacrit) 04/23/2022: Hemoglobin 10.7, ferritin 63, iron saturation 14%, B12 575 07/15/2022: Hemoglobin 9.9     Current treatment: Retacrit 40,000 units currently receiving it every 12 weeks. (Inj if Hb is less than 11)    Return to clinic in 3 months with labs

## 2022-07-17 LAB — FERRITIN: Ferritin: 70 ng/mL (ref 11–307)

## 2022-07-20 NOTE — Telephone Encounter (Signed)
She will have to tell the pharmacy that she is picking up early due to travel or they may not fill  I sent it

## 2022-07-20 NOTE — Telephone Encounter (Signed)
Pt called stating she has enough tablets for the rest of the week but she is going out of town & will need a refill wile she's gone. Call back # [phone]

## 2022-07-23 ENCOUNTER — Ambulatory Visit: Payer: Medicare Other

## 2022-07-23 ENCOUNTER — Other Ambulatory Visit: Payer: Medicare Other

## 2022-08-25 ENCOUNTER — Other Ambulatory Visit: Payer: Self-pay | Admitting: Family Medicine

## 2022-08-25 NOTE — Telephone Encounter (Signed)
Last filled on 07/20/22 #90 tabs/ 0 refill, last appt was for foot issues on 03/11/22, please advise

## 2022-09-03 ENCOUNTER — Telehealth: Payer: Self-pay | Admitting: Family Medicine

## 2022-09-03 NOTE — Telephone Encounter (Signed)
Daughter needs to speak with Dr Glori Bickers about a change in her moms cognitive decline,and her moms memory. She is concerned that she isn't taking her medications as well. She is requesting a phone call.

## 2022-09-04 NOTE — Telephone Encounter (Signed)
Called and spoke to [place].   Pt moved out and lives with her son.  Some irritability and cognitive slowing recently.  May not be taking her zoloft  She is due for a HTN visit anyway so she will make an appt to bring her in Bethena Roys will come also)

## 2022-10-06 DIAGNOSIS — Z79899 Other long term (current) drug therapy: Secondary | ICD-10-CM | POA: Diagnosis not present

## 2022-10-06 DIAGNOSIS — M791 Myalgia, unspecified site: Secondary | ICD-10-CM | POA: Diagnosis not present

## 2022-10-06 DIAGNOSIS — G894 Chronic pain syndrome: Secondary | ICD-10-CM | POA: Diagnosis not present

## 2022-10-06 DIAGNOSIS — M5459 Other low back pain: Secondary | ICD-10-CM | POA: Diagnosis not present

## 2022-10-07 ENCOUNTER — Telehealth: Payer: Self-pay | Admitting: Hematology and Oncology

## 2022-10-07 ENCOUNTER — Telehealth: Payer: Self-pay | Admitting: Cardiology

## 2022-10-07 DIAGNOSIS — E785 Hyperlipidemia, unspecified: Secondary | ICD-10-CM

## 2022-10-07 DIAGNOSIS — I2510 Atherosclerotic heart disease of native coronary artery without angina pectoris: Secondary | ICD-10-CM

## 2022-10-07 NOTE — Telephone Encounter (Signed)
Patient is requesting written orders for lab work. She states she is able to come by the office tomorrow to pick up the orders.

## 2022-10-07 NOTE — Telephone Encounter (Signed)
Rescheduled appointment per provider PAL. Patient is aware of the changes made to her upcoming appointment. 

## 2022-10-07 NOTE — Telephone Encounter (Signed)
Spoke to patient. Patient need lab slip to take the [facility] since goes on periodically for lab work.   Rn informed patient will need to nmail her the lab slip once Dr Ellyn Hack needs to signs  lab slip. The Cancer center requires a different labslip to process blood work,  RN informed patient Dr Ellyn Hack  is not in the office to sign     Once signed will mail to patient .  Upcoming appointment changed from 10/05/22 to 12/18/22 due to Dr Ellyn Hack not being in the office  on that day.   Patient verbalized understanding.

## 2022-10-07 NOTE — Telephone Encounter (Signed)
Could not locate active lab orders Sent to primary nurse for review

## 2022-10-09 ENCOUNTER — Other Ambulatory Visit: Payer: Self-pay

## 2022-10-09 DIAGNOSIS — E785 Hyperlipidemia, unspecified: Secondary | ICD-10-CM

## 2022-10-09 DIAGNOSIS — D638 Anemia in other chronic diseases classified elsewhere: Secondary | ICD-10-CM

## 2022-10-13 ENCOUNTER — Inpatient Hospital Stay: Payer: Medicare Other

## 2022-10-13 ENCOUNTER — Inpatient Hospital Stay: Payer: Medicare Other | Admitting: Hematology and Oncology

## 2022-10-14 ENCOUNTER — Ambulatory Visit: Payer: Medicare Other | Admitting: Cardiology

## 2022-10-15 ENCOUNTER — Inpatient Hospital Stay: Payer: Medicare Other

## 2022-10-15 ENCOUNTER — Other Ambulatory Visit: Payer: Self-pay | Admitting: Cardiology

## 2022-10-15 ENCOUNTER — Inpatient Hospital Stay: Payer: Medicare Other | Admitting: Hematology and Oncology

## 2022-10-20 ENCOUNTER — Inpatient Hospital Stay: Payer: Medicare Other | Admitting: Adult Health

## 2022-10-20 ENCOUNTER — Telehealth: Payer: Self-pay | Admitting: Family Medicine

## 2022-10-20 ENCOUNTER — Inpatient Hospital Stay: Payer: Medicare Other

## 2022-10-20 ENCOUNTER — Other Ambulatory Visit: Payer: Self-pay | Admitting: Family Medicine

## 2022-10-20 DIAGNOSIS — E2839 Other primary ovarian failure: Secondary | ICD-10-CM

## 2022-10-20 NOTE — Telephone Encounter (Signed)
Pt called stating her last ov with Tower, they discussed the her having a bone density done. Pt states on 3/28 she'll be having her mammogram done at [facility]. Pt states the office instructed her to get a referral from [facility] sent to them for the bone density.

## 2022-10-20 NOTE — Telephone Encounter (Signed)
I ordered  For [facility] Please release/print and fax there  Thanks

## 2022-10-20 NOTE — Addendum Note (Signed)
Addended by: [name] A on: 10/20/2022 04:32 PM   Modules accepted: Orders

## 2022-10-21 NOTE — Telephone Encounter (Signed)
Order released and sent to [facility], left VM letting pt know and to call [facility] and get DEXA scheduled also

## 2022-10-26 ENCOUNTER — Other Ambulatory Visit: Payer: Self-pay

## 2022-10-26 DIAGNOSIS — D649 Anemia, unspecified: Secondary | ICD-10-CM

## 2022-10-26 NOTE — Telephone Encounter (Signed)
Left message - to informed patient  labslip  have been mailed to you

## 2022-10-26 NOTE — Telephone Encounter (Signed)
Patient return call. She states she would like to pick labslip tomorrow from the [facility] office.  RN informed patient will have  labslip in an envelope for patient to  pick up

## 2022-10-28 ENCOUNTER — Inpatient Hospital Stay: Payer: Medicare Other | Attending: Hematology and Oncology

## 2022-10-28 ENCOUNTER — Encounter: Payer: Self-pay | Admitting: Adult Health

## 2022-10-28 ENCOUNTER — Inpatient Hospital Stay: Payer: Medicare Other

## 2022-10-28 ENCOUNTER — Inpatient Hospital Stay (HOSPITAL_BASED_OUTPATIENT_CLINIC_OR_DEPARTMENT_OTHER): Payer: Medicare Other | Admitting: Adult Health

## 2022-10-28 ENCOUNTER — Other Ambulatory Visit: Payer: Self-pay

## 2022-10-28 VITALS — BP 138/58 | HR 66 | Temp 97.7°F | Resp 16 | Ht 63.75 in | Wt 172.8 lb

## 2022-10-28 DIAGNOSIS — N183 Chronic kidney disease, stage 3 unspecified: Secondary | ICD-10-CM | POA: Diagnosis not present

## 2022-10-28 DIAGNOSIS — Z8673 Personal history of transient ischemic attack (TIA), and cerebral infarction without residual deficits: Secondary | ICD-10-CM | POA: Diagnosis not present

## 2022-10-28 DIAGNOSIS — K219 Gastro-esophageal reflux disease without esophagitis: Secondary | ICD-10-CM | POA: Insufficient documentation

## 2022-10-28 DIAGNOSIS — D509 Iron deficiency anemia, unspecified: Secondary | ICD-10-CM | POA: Insufficient documentation

## 2022-10-28 DIAGNOSIS — M353 Polymyalgia rheumatica: Secondary | ICD-10-CM | POA: Diagnosis not present

## 2022-10-28 DIAGNOSIS — D631 Anemia in chronic kidney disease: Secondary | ICD-10-CM | POA: Insufficient documentation

## 2022-10-28 DIAGNOSIS — E785 Hyperlipidemia, unspecified: Secondary | ICD-10-CM | POA: Diagnosis not present

## 2022-10-28 DIAGNOSIS — D638 Anemia in other chronic diseases classified elsewhere: Secondary | ICD-10-CM

## 2022-10-28 DIAGNOSIS — D649 Anemia, unspecified: Secondary | ICD-10-CM

## 2022-10-28 DIAGNOSIS — M8580 Other specified disorders of bone density and structure, unspecified site: Secondary | ICD-10-CM | POA: Diagnosis not present

## 2022-10-28 DIAGNOSIS — I129 Hypertensive chronic kidney disease with stage 1 through stage 4 chronic kidney disease, or unspecified chronic kidney disease: Secondary | ICD-10-CM | POA: Insufficient documentation

## 2022-10-28 DIAGNOSIS — I2510 Atherosclerotic heart disease of native coronary artery without angina pectoris: Secondary | ICD-10-CM | POA: Diagnosis not present

## 2022-10-28 LAB — CMP (CANCER CENTER ONLY)
ALT: 16 U/L (ref 0–44)
AST: 21 U/L (ref 15–41)
Albumin: 3.7 g/dL (ref 3.5–5.0)
Alkaline Phosphatase: 71 U/L (ref 38–126)
Anion gap: 6 (ref 5–15)
BUN: 15 mg/dL (ref 8–23)
CO2: 26 mmol/L (ref 22–32)
Calcium: 8.8 mg/dL — ABNORMAL LOW (ref 8.9–10.3)
Chloride: 108 mmol/L (ref 98–111)
Creatinine: 1.07 mg/dL — ABNORMAL HIGH (ref 0.44–1.00)
GFR, Estimated: 52 mL/min — ABNORMAL LOW (ref >60)
Glucose, Bld: 86 mg/dL (ref 70–99)
Potassium: 4.1 mmol/L (ref 3.5–5.1)
Sodium: 140 mmol/L (ref 135–145)
Total Bilirubin: 0.5 mg/dL (ref 0.3–1.2)
Total Protein: 6.8 g/dL (ref 6.5–8.1)

## 2022-10-28 LAB — CBC WITH DIFFERENTIAL (CANCER CENTER ONLY)
Abs Immature Granulocytes: 0.02 10*3/uL (ref 0.00–0.07)
Basophils Absolute: 0 10*3/uL (ref 0.0–0.1)
Basophils Relative: 1 %
Eosinophils Absolute: 0.1 10*3/uL (ref 0.0–0.5)
Eosinophils Relative: 3 %
HCT: 32.5 % — ABNORMAL LOW (ref 36.0–46.0)
Hemoglobin: 10.5 g/dL — ABNORMAL LOW (ref 12.0–15.0)
Immature Granulocytes: 0 %
Lymphocytes Relative: 28 %
Lymphs Abs: 1.3 10*3/uL (ref 0.7–4.0)
MCH: 29.5 pg (ref 26.0–34.0)
MCHC: 32.3 g/dL (ref 30.0–36.0)
MCV: 91.3 fL (ref 80.0–100.0)
Monocytes Absolute: 0.3 10*3/uL (ref 0.1–1.0)
Monocytes Relative: 7 %
Neutro Abs: 3 10*3/uL (ref 1.7–7.7)
Neutrophils Relative %: 61 %
Platelet Count: 239 10*3/uL (ref 150–400)
RBC: 3.56 MIL/uL — ABNORMAL LOW (ref 3.87–5.11)
RDW: 13.2 % (ref 11.5–15.5)
WBC Count: 4.8 10*3/uL (ref 4.0–10.5)
nRBC: 0 % (ref 0.0–0.2)

## 2022-10-28 LAB — VITAMIN B12: Vitamin B-12: 348 pg/mL (ref 180–914)

## 2022-10-28 LAB — IRON AND IRON BINDING CAPACITY (CC-WL,HP ONLY)
Iron: 62 ug/dL (ref 28–170)
Saturation Ratios: 22 % (ref 10.4–31.8)
TIBC: 288 ug/dL (ref 250–450)
UIBC: 226 ug/dL (ref 148–442)

## 2022-10-28 LAB — FERRITIN: Ferritin: 76 ng/mL (ref 11–307)

## 2022-10-28 MED ORDER — EPOETIN ALFA-EPBX 40000 UNIT/ML IJ SOLN
40000.0000 [IU] | Freq: Once | INTRAMUSCULAR | Status: AC
  Administered 2022-10-28: 40000 [IU] via SUBCUTANEOUS
  Filled 2022-10-28: qty 1

## 2022-10-28 NOTE — Progress Notes (Signed)
[name] [name] [name] Follow up:    Tower, [name] Fanny, MD [place] [address]   DIAGNOSIS: 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 ( anemia of chronic inflammation), patient has chronic polymyalgia rheumatica  SUMMARY OF HEMATOLOGIC HISTORY: 10/12/2017: Hemoglobin 10.2, MCV 94, normal WBC and platelet counts, creatinine 1.02 04/11/2019: Hemoglobin 9, evaluated by Dr. 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   Began Retacrit on 04/18/2019  CURRENT THERAPY: Retacrit every 12 weeks  INTERVAL HISTORY: [name] [name] [name] 82 y.o. female returns for follow-up and evaluation while receiving Retacrit injections.  She is doing well today.  She denies any significant issues with the Retacrit and her hemoglobin is 10.5.   Patient Active Problem List   Diagnosis Date Noted   Elevated TSH 12/09/2021   Coronary artery disease involving native heart without angina pectoris 05/02/2021   Current use of proton pump inhibitor 10/29/2020   Cough 07/24/2020   History of transient ischemic attack (TIA) 06/04/2020   Normochromic normocytic anemia    History of femur fracture 02/04/2020   Renal insufficiency 11/01/2019   Pedal edema 10/04/2018   S/P shoulder replacement, left 12/17/2017   S/P reverse total shoulder arthroplasty, left 12/17/2017   Routine general medical examination at a health care facility 10/14/2017   Preop cardiovascular exam 10/01/2017   CAD S/P percutaneous coronary angioplasty 01/27/2017   Aortic regurgitation 01/27/2017   Dyspnea on exertion 08/19/2016   Left bundle branch block (LBBB) on electrocardiogram 08/19/2016   Estrogen deficiency 10/08/2015   Chronic fatigue 08/20/2015   Deficiency anemia 11/17/2013   Encounter for Medicare annual wellness exam 07/17/2013   PMR (polymyalgia rheumatica) ([place]) 07/17/2013   Intertrigo 04/19/2013   Obesity (BMI 30-39.9) 04/05/2013    S/P right THA, AA 04/04/2013   IBS (irritable bowel syndrome) 05/30/2012   Anemia of chronic disease 04/16/2010   Generalized anxiety disorder 04/16/2010   GERD 03/04/2009   Vitamin D deficiency 03/12/2008   URINARY TRACT INFECTION, RECURRENT 08/07/2007   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  MEDICAL HISTORY: Past Medical History:  Diagnosis Date   Anemia    of chronic disease   Anxiety    Arthritis    "psoratic arthritis; new dx" (09/11/2016)   CAD S/P percutaneous coronary angioplasty 08/2016   mLCx 90% -> 0% (2.75 mm x 16 mm) Synergy DES PCI Ostial D1 70% (not PTCA target). Moderate ostial and mD2.  LAD tandem 50% stenoses w/ negative FFR (0.86)   [name] [name] (HCC)    skin [name] [name] on leg   Chronic lower back pain    Complication of anesthesia 1980 and 1982   trouble waking up after breast biopsy, many surgeries since no problem   Depression    Dyspnea on effort - CHRONIC    partial relief of Sx post PCI of L Circumflex.   Fibrocystic breast    GERD (gastroesophageal reflux disease)    Headache    "monthly" (09/11/2016)   History of blood  transfusion    "several since age 64; I've had them after most of my surgeries" (09/11/2016)   History of myocardial infarction     - Myoview [name] 2018 indicated small Inferior - Inferoseptal Infarct (vs. rest ischemia). Echo with inferoseptal hypokinesis.   Hx of skin [name] [name], basal cell 2013 and 2012    right inner, lower leg; several scattered around my body   Hypertension    Iron deficiency anemia    "I've had an iron infusion"   Osteoarthritis    Osteopenia    Peptic ulcer disease    H pylori   PMR (polymyalgia rheumatica) (HCC)    PONV (postoperative nausea and vomiting)    Psoriasis    TIA (transient ischemic attack) 06/03/2020   Symptoms of right arm weakness/numbness and slurred speech as well as right droopm => MRI brain/MRA head normal.  Normal echo normal carotid Dopplers.    SURGICAL HISTORY: Past Surgical History:  Procedure Laterality Date   APPENDECTOMY     BACK SURGERY     x 6    BREAST BIOPSY Left 1980 and 1982   "benign"   CARDIAC CATHETERIZATION N/A 09/11/2016   Procedure: Left Heart Cath and Coronary Angiography;  Surgeon: Leonie Man, MD;  Location: [facility];  Service: Cardiovascular: mLCx 90% * (PCI). Ostial D1 70% (not PTCA target). Mod ostial and mD2.  LAD tandem 50% stenoses w/ negative FFR (0.86)   CARDIAC CATHETERIZATION N/A 09/11/2016   Procedure: Intravascular Pressure Wire/FFR Study;  Surgeon: Leonie Man, MD;  Location: [facility];  Service: Cardiovascular: --  LAD tandem 50% stenoses w/ negative FFR (0.86   CARDIAC CATHETERIZATION N/A 09/11/2016   Procedure: Coronary Stent Intervention;  Surgeon: Leonie Man, MD;  Location: [facility];  Service: Cardiovascular: -- DES PCI mLCx 90%--0% SYNERGY DES 2.75 MM X 16 MM.   CARDIAC CATHETERIZATION  2001   non-obstructive CAD   CATARACT EXTRACTION W/ INTRAOCULAR LENS  IMPLANT, BILATERAL  July -  August 2017   DILATION AND CURETTAGE OF UTERUS  X 3   ESOPHAGOGASTRODUODENOSCOPY  03/2009   ulcer,HP, GERD stricture   EXCISIONAL HEMORRHOIDECTOMY     HIP ARTHROPLASTY Left 2010   JOINT REPLACEMENT     KNEE ARTHROSCOPY Bilateral    LAPAROSCOPIC CHOLECYSTECTOMY  2005   LUMBAR [place] SURGERY  11/1984; 1987; 1989; 2004; 2005 X 2   deg. disk lumber spine    MOHS SURGERY Right     "inside my lower leg"   NASAL SINUS SURGERY  1970s   NM MYOVIEW LTD  08/28/2016   INTERMEDIATE RISK (b/c reduced EF & ? small infarct) -- EF ~35-40%.  ? Prior Small Inferior-apical & septal infarct (w/o ischemia).  Difuse HK - worse in inferoapex.  Suggest prior infarct with no ischemia. Ischemic area correlates with echo WMA   NM MYOVIEW LTD  05/27/2021   Findings consistent with prior anteroapical infarct with fixed defect.  No peri-infarct ischemia.  Notable improvement from prior study.:   ORIF FEMUR FRACTURE Right 01/04/2020   Procedure: OPEN REDUCTION INTERNAL FIXATION (ORIF) DISTAL FEMUR FRACTURE;  Surgeon: Rod Can, MD;  Location: [facility];  Service: Orthopedics;  Laterality: Right;   REVERSE SHOULDER ARTHROPLASTY Left 12/17/2017   Procedure: LEFT REVERSE SHOULDER ARTHROPLASTY;  Surgeon: Netta Cedars, MD;  Location: [facility];  Service: Orthopedics;  Laterality: Left;   TOTAL HIP ARTHROPLASTY Right 04/04/2013   Procedure: RIGHT TOTAL HIP ARTHROPLASTY ANTERIOR APPROACH;  Surgeon: Pietro Cassis  Alvan Dame, MD;  Location: [facility];  Service: Orthopedics;  Laterality: Right;   TOTAL KNEE ARTHROPLASTY Right 2009   TRANSTHORACIC ECHOCARDIOGRAM  1/'18; 1/'19   a) 1/'18: mild conc LVH. EF 45-50% - anteroseptal & inferoseptal HK. Mod AI.; b) 1/'19 - post PCI: (post PCI) --> EF normalized:  Normal LV systolic function; mild diastolic dysfunction; mild   LVH; sclerotic aortic valve with mild AI.  No RWMA   TRANSTHORACIC ECHOCARDIOGRAM  06/04/2020    for TIA: Normal LV size and function.  No or WMA.  EF 60 to 65%.  GR 1 DD with elevated LAP.  Normal RV size and function.  Normal RVP, RAP.  Mild aortic valve calcification-sclerosis but no stenosis.  Trivial AI.  No embolic source   TUBAL LIGATION     VAGINAL HYSTERECTOMY  1970s   partial     SOCIAL HISTORY: Social History   Socioeconomic History   Marital status: Widowed    Spouse name: Not on file   Number of children: 5   Years of education: Not on  file   Highest education level: Not on file  Occupational History   Not on file  Tobacco Use   Smoking status: Never   Smokeless tobacco: Never  Vaping Use   Vaping Use: Never used  Substance and Sexual Activity   Alcohol use: Yes    Alcohol/week: 0.0 standard drinks of alcohol    Comment: 09/11/2016 "might have a margarita q 3-4 months"   Drug use: No   Sexual activity: Not Currently  Other Topics Concern   Not on file  Social History Narrative   Now Widowed as of [name] 2020. Birthed 5 children. Lost one at age 29.   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   9 grandchildren    Social Determinants of Health   Financial Resource Strain: Low Risk  (12/03/2021)   Overall Financial Resource Strain (CARDIA)    Difficulty of Paying Living Expenses: Not hard at all  Food Insecurity: No Food Insecurity (12/03/2021)   Hunger Vital Sign    Worried About Running Out of Food in the Last Year: Never true    [place] in the Last Year: Never true  Transportation Needs: No Transportation Needs (12/03/2021)   PRAPARE - Hydrologist (Medical): No    Lack of Transportation (Non-Medical): No  Physical Activity: Insufficiently Active (12/03/2021)   Exercise Vital Sign    Days of Exercise per Week: 5 days    Minutes of Exercise per Session: 20 min  Stress: No Stress Concern Present (12/03/2021)   [place]    Feeling of Stress : Only a little  Social Connections: Moderately Integrated (12/03/2021)   Social Connection and Isolation Panel [NHANES]    Frequency of Communication with Friends and Family: More than three times a week    Frequency of Social Gatherings with Friends and Family: More than three times a week    Attends Religious  Services: More than 4 times per year    Active Member of [employer] or Organizations: Yes    Attends Archivist Meetings: More than 4 times per year    Marital Status: Widowed  Intimate Partner Violence:  Not At Risk (12/03/2021)   Humiliation, Afraid, Rape, and Kick questionnaire    Fear of Current or Ex-Partner: No    Emotionally Abused: No    Physically Abused: No    Sexually Abused: No    FAMILY HISTORY: Family History  Problem Relation Age of Onset   Arthritis Mother    Emphysema Mother    Hyperlipidemia Mother    Hypertension Mother    Heart disease Mother    Heart disease Brother        CAD   Diabetes Paternal Grandmother    Heart disease Maternal Aunt     Review of Systems  Constitutional:  Negative for appetite change, chills, fatigue, fever and unexpected weight change.  HENT:   Negative for hearing loss, lump/mass and trouble swallowing.   Eyes:  Negative for eye problems and icterus.  Respiratory:  Negative for chest tightness, cough and shortness of breath.   Cardiovascular:  Negative for chest pain, leg swelling and palpitations.  Gastrointestinal:  Negative for abdominal distention, abdominal pain, constipation, diarrhea, nausea and vomiting.  Endocrine: Negative for hot flashes.  Genitourinary:  Negative for difficulty urinating.   Musculoskeletal:  Negative for arthralgias.  Skin:  Negative for itching and rash.  Neurological:  Negative for dizziness, extremity weakness, headaches and numbness.  Hematological:  Negative for adenopathy. Does not bruise/bleed easily.  Psychiatric/Behavioral:  Negative for depression. The patient is not nervous/anxious.       PHYSICAL EXAMINATION  ECOG PERFORMANCE STATUS: 0 - Asymptomatic  Vitals:   10/28/22 1111 10/28/22 1134  BP: (!) 179/69 (!) 138/58  Pulse: 66   Resp: 16   Temp: 97.7 F (36.5 C)   SpO2: 100%     Physical Exam Vitals (I rechecked the blood pressure manually which was much improved and  documented in the vital signs flowsheet.) reviewed.  Constitutional:      General: She is not in acute distress.    Appearance: Normal appearance. She is not toxic-appearing.  HENT:     Head: Normocephalic and atraumatic.  Eyes:     General: No scleral icterus. Cardiovascular:     Rate and Rhythm: Normal rate and regular rhythm.     Pulses: Normal pulses.     Heart sounds: Normal heart sounds.  Pulmonary:     Effort: Pulmonary effort is normal.     Breath sounds: Normal breath sounds.  Abdominal:     General: Abdomen is flat. Bowel sounds are normal. There is no distension.     Palpations: Abdomen is soft.     Tenderness: There is no abdominal tenderness.  Musculoskeletal:        General: No swelling.     Cervical back: Neck supple.  Lymphadenopathy:     Cervical: No cervical adenopathy.  Skin:    General: Skin is warm and dry.     Findings: No rash.  Neurological:     General: No focal deficit present.     Mental Status: She is alert.  Psychiatric:        Mood and Affect: Mood normal.        Behavior: Behavior normal.     LABORATORY DATA:  CBC    Component Value Date/Time   WBC 4.8 10/28/2022 1045   WBC 6.7 12/02/2021 0906   RBC 3.56 (L) 10/28/2022 1045   HGB 10.5 (L) 10/28/2022 1045   HGB 9.9 (L) 09/29/2016 1146   HGB 10.5 (L) 08/28/2015 1313   HCT 32.5 (L) 10/28/2022 1045  HCT 31.0 (L) 09/29/2016 1146   HCT 32.7 (L) 08/28/2015 1313   PLT 239 10/28/2022 1045   PLT 277 09/29/2016 1146   MCV 91.3 10/28/2022 1045   MCV 93 09/29/2016 1146   MCV 90.6 08/28/2015 1313   MCH 29.5 10/28/2022 1045   MCHC 32.3 10/28/2022 1045   RDW 13.2 10/28/2022 1045   RDW 15.4 09/29/2016 1146   RDW 12.9 08/28/2015 1313   LYMPHSABS 1.3 10/28/2022 1045   LYMPHSABS 1.3 08/28/2015 1313   MONOABS 0.3 10/28/2022 1045   MONOABS 0.6 08/28/2015 1313   EOSABS 0.1 10/28/2022 1045   EOSABS 0.0 08/28/2015 1313   EOSABS 0.1 04/03/2010 1147   BASOSABS 0.0 10/28/2022 1045   BASOSABS 0.0  08/28/2015 1313    CMP     Component Value Date/Time   NA 140 10/28/2022 1045   NA 131 (A) 02/06/2020 0000   NA 141 12/10/2014 1012   K 4.1 10/28/2022 1045   K 3.7 12/10/2014 1012   CL 108 10/28/2022 1045   CL 98 02/28/2009 1250   CO2 26 10/28/2022 1045   CO2 25 12/10/2014 1012   GLUCOSE 86 10/28/2022 1045   GLUCOSE 104 12/10/2014 1012   GLUCOSE 103 02/28/2009 1250   BUN 15 10/28/2022 1045   BUN 21 09/08/2016 1104   BUN 15.7 12/10/2014 1012   CREATININE 1.07 (H) 10/28/2022 1045   CREATININE 1.01 (H) 09/16/2018 1703   CREATININE 0.9 12/10/2014 1012   CALCIUM 8.8 (L) 10/28/2022 1045   CALCIUM 9.2 12/10/2014 1012   PROT 6.8 10/28/2022 1045   PROT 6.2 10/27/2016 1015   PROT 6.8 12/10/2014 1012   ALBUMIN 3.7 10/28/2022 1045   ALBUMIN 3.6 10/27/2016 1015   ALBUMIN 3.5 12/10/2014 1012   AST 21 10/28/2022 1045   AST 18 12/10/2014 1012   ALT 16 10/28/2022 1045   ALT 13 12/10/2014 1012   ALKPHOS 71 10/28/2022 1045   ALKPHOS 61 12/10/2014 1012   BILITOT 0.5 10/28/2022 1045   BILITOT 0.47 12/10/2014 1012   GFRNONAA 52 (L) 10/28/2022 1045   GFRAA 56 (L) 05/02/2020 1146        ASSESSMENT and THERAPY PLAN:   Anemia of chronic disease Thomasina is an 82 year old woman with anemia of chronic disease here today for labs and follow-up prior to receiving an injection with Retacrit.  She is tolerating the Retacrit injections well.  She is not hypertensive today as we initially thought.  I did a manual blood pressure on her and her blood pressure is much improved from what we had initially recorded.  She does not check her blood pressure at home and says that she does not typically have elevated blood pressure when she is going to other offices.  She will proceed with her Retacrit today as her hemoglobin is less than 11.  She will return in May for labs, follow-up with Dr. Lindi Adie, and her next Retacrit injection.  She knows to call for any questions or concerns that may arise between now  and her next visit.    All questions were answered. The patient knows to call the clinic with any problems, questions or concerns. We can certainly see the patient much sooner if necessary.  Total encounter time:20 minutes*in face-to-face visit time, chart review, lab review, care coordination, order entry, and documentation of the encounter time.    Wilber Bihari, NP 10/28/22 11:49 AM Medical Oncology and Hematology [facility] [place], [place] [address] Tel. [phone]  Fax. [phone]  *Total Encounter Time as defined by the Centers for Medicare and Medicaid Services includes, in addition to the face-to-face time of a patient visit (documented in the note above) non-face-to-face time: obtaining and reviewing outside history, ordering and reviewing medications, tests or procedures, care coordination (communications with other health care professionals or caregivers) and documentation in the medical record.

## 2022-10-28 NOTE — Assessment & Plan Note (Signed)
[name] [name] is an 82 year old woman with anemia of chronic disease here today for labs and follow-up prior to receiving an injection with Retacrit.  She is tolerating the Retacrit injections well.  She is not hypertensive today as we initially thought.  I did a manual blood pressure on her and her blood pressure is much improved from what we had initially recorded.  She does not check her blood pressure at home and says that she does not typically have elevated blood pressure when she is going to other offices.  She will proceed with her Retacrit today as her hemoglobin is less than 11.  She will return in May for labs, follow-up with Dr. Lindi Adie, and her next Retacrit injection.  She knows to call for any questions or concerns that may arise between now and her next visit.

## 2022-11-17 ENCOUNTER — Telehealth: Payer: Self-pay | Admitting: Hematology and Oncology

## 2022-11-17 NOTE — Telephone Encounter (Signed)
Rescheduled appointment per provider [facility]. Patient is aware of the changes made to her upcoming appointments.

## 2022-11-19 DIAGNOSIS — Z1231 Encounter for screening mammogram for malignant neoplasm of breast: Secondary | ICD-10-CM | POA: Diagnosis not present

## 2022-11-19 DIAGNOSIS — M85832 Other specified disorders of bone density and structure, left forearm: Secondary | ICD-10-CM | POA: Diagnosis not present

## 2022-11-19 LAB — HM MAMMOGRAPHY

## 2022-11-19 LAB — HM DEXA SCAN

## 2022-11-24 ENCOUNTER — Encounter: Payer: Self-pay | Admitting: *Deleted

## 2022-11-30 ENCOUNTER — Telehealth (INDEPENDENT_AMBULATORY_CARE_PROVIDER_SITE_OTHER): Payer: Self-pay | Admitting: Cardiology

## 2022-11-30 DIAGNOSIS — E785 Hyperlipidemia, unspecified: Secondary | ICD-10-CM

## 2022-11-30 NOTE — Telephone Encounter (Signed)
LAB RESULTS: 11/11/2022  Cholesterol levels look relatively stable.  We want her LDL to be a little bit better. Plan: Switch from atorvastatin 20 mg daily to rosuvastatin 20 mg daily.   Bryan Lemma, MD

## 2022-11-30 NOTE — Telephone Encounter (Signed)
RN called and spoke to patient. Result given.  Patient states she would like to change  prescription at the next office appointment on 12/18/22.  She  states she only wants medication refilled at one month increments.

## 2022-12-09 ENCOUNTER — Ambulatory Visit (INDEPENDENT_AMBULATORY_CARE_PROVIDER_SITE_OTHER): Payer: Medicare Other

## 2022-12-09 VITALS — Ht 63.75 in | Wt 178.0 lb

## 2022-12-09 DIAGNOSIS — Z0000 Encounter for general adult medical examination without abnormal findings: Secondary | ICD-10-CM

## 2022-12-09 NOTE — Patient Instructions (Signed)
[name] [name] , Thank you for taking time to come for your Medicare Wellness Visit. I appreciate your ongoing commitment to your health goals. Please review the following plan we discussed and let me know if I can assist you in the future.   These are the goals we discussed:  Goals      Follow up with Primary Care Provider     Starting 10/19/2018, I will continue to take medications as prescribed and to keep appointments with PCP as scheduled.      Patient Stated     10/27/2019, I will maintain and continue medications as prescribed.      Patient Stated     10/30/2020, I will maintain and continue medications as prescribed.      Patient Stated     Maintain current health status.        This is a list of the screening recommended for you and due dates:  Health Maintenance  Topic Date Due   Zoster (Shingles) Vaccine (1 of 2) Never done   DTaP/Tdap/Td vaccine (3 - Tdap) 01/25/2018   COVID-19 Vaccine (5 - 2023-24 season) 04/24/2022   Flu Shot  03/25/2023   Mammogram  11/19/2023   Medicare Annual Wellness Visit  12/09/2023   Pneumonia Vaccine  Completed   DEXA scan (bone density measurement)  Completed   HPV Vaccine  Aged Out   Opioid Pain Medicine Management Opioids are powerful medicines that are used to treat moderate to severe pain. When used for short periods of time, they can help you to: Sleep better. Do better in physical or occupational therapy. Feel better in the first few days after an injury. Recover from surgery. Opioids should be taken with the supervision of a trained health care provider. They should be taken for the shortest period of time possible. This is because opioids can be addictive, and the longer you take opioids, the greater your risk of addiction. This addiction can also be called opioid use disorder. What are the risks? Using opioid pain medicines for longer than 3 days increases your risk of side effects. Side effects include: Constipation. Nausea and  vomiting. Breathing difficulties (respiratory depression). Drowsiness. Confusion. Opioid use disorder. Itching. Taking opioid pain medicine for a long period of time can affect your ability to do daily tasks. It also puts you at risk for: Motor vehicle crashes. Depression. Suicide. Heart attack. Overdose, which can be life-threatening. What is a pain treatment plan? A pain treatment plan is an agreement between you and your health care provider. Pain is unique to each person, and treatments vary depending on your condition. To manage your pain, you and your health care provider need to work together. To help you do this: Discuss the goals of your treatment, including how much pain you might expect to have and how you will manage the pain. Review the risks and benefits of taking opioid medicines. Remember that a good treatment plan uses more than one approach and minimizes the chance of side effects. Be honest about the amount of medicines you take and about any drug or alcohol use. Get pain medicine prescriptions from only one health care provider. Pain can be managed with many types of alternative treatments. Ask your health care provider to refer you to one or more specialists who can help you manage pain through: Physical or occupational therapy. Counseling (cognitive behavioral therapy). Good nutrition. Biofeedback. Massage. Meditation. Non-opioid medicine. Following a gentle exercise program. How to use opioid pain medicine Taking medicine  Take your pain medicine exactly as told by your health care provider. Take it only when you need it. If your pain gets less severe, you may take less than your prescribed dose if your health care provider approves. If you are not having pain, do nottake pain medicine unless your health care provider tells you to take it. If your pain is severe, do nottry to treat it yourself by taking more pills than instructed on your prescription. Contact  your health care provider for help. Write down the times when you take your pain medicine. It is easy to become confused while on pain medicine. Writing the time can help you avoid overdose. Take other over-the-counter or prescription medicines only as told by your health care provider. Keeping yourself and others safe  While you are taking opioid pain medicine: Do not drive, use machinery, or power tools. Do not sign legal documents. Do not drink alcohol. Do not take sleeping pills. Do not supervise children by yourself. Do not do activities that require climbing or being in high places. Do not go to a lake, river, ocean, spa, or swimming pool. Do not share your pain medicine with anyone. Keep pain medicine in a locked cabinet or in a secure area where pets and children cannot reach it. Stopping your use of opioids If you have been taking opioid medicine for more than a few weeks, you may need to slowly decrease (taper) how much you take until you stop completely. Tapering your use of opioids can decrease your risk of symptoms of withdrawal, such as: Pain and cramping in the abdomen. Nausea. Sweating. Sleepiness. Restlessness. Uncontrollable shaking (tremors). Cravings for the medicine. Do not attempt to taper your use of opioids on your own. Talk with your health care provider about how to do this. Your health care provider may prescribe a step-down schedule based on how much medicine you are taking and how long you have been taking it. Getting rid of leftover pills Do not save any leftover pills. Get rid of leftover pills safely by: Taking the medicine to a prescription take-back program. This is usually offered by the county or law enforcement. Bringing them to a pharmacy that has a drug disposal container. Flushing them down the toilet. Check the label or package insert of your medicine to see whether this is safe to do. Throwing them out in the trash. Check the label or package  insert of your medicine to see whether this is safe to do. If it is safe to throw it out, remove the medicine from the original container, put it into a sealable bag or container, and mix it with used coffee grounds, food scraps, dirt, or cat litter before putting it in the trash. Follow these instructions at home: Activity Do exercises as told by your health care provider. Avoid activities that make your pain worse. Return to your normal activities as told by your health care provider. Ask your health care provider what activities are safe for you. General instructions You may need to take these actions to prevent or treat constipation: Drink enough fluid to keep your urine pale yellow. Take over-the-counter or prescription medicines. Eat foods that are high in fiber, such as beans, whole grains, and fresh fruits and vegetables. Limit foods that are high in fat and processed sugars, such as fried or sweet foods. Keep all follow-up visits. This is important. Where to find support If you have been taking opioids for a long time, you may benefit from  receiving support for quitting from a local support group or counselor. Ask your health care provider for a referral to these resources in your area. Where to find more information Centers for Disease Control and Prevention (CDC): FootballExhibition.com.br [place] Food and Drug Administration (FDA): PumpkinSearch.com.ee Get help right away if: You may have taken too much of an opioid (overdosed). Common symptoms of an overdose: Your breathing is slower or more shallow than normal. You have a very slow heartbeat (pulse). You have slurred speech. You have nausea and vomiting. Your pupils become very small. You have other potential symptoms: You are very confused. You faint or feel like you will faint. You have cold, clammy skin. You have blue lips or fingernails. You have thoughts of harming yourself or harming others. These symptoms may represent a serious problem  that is an emergency. Do not wait to see if the symptoms will go away. Get medical help right away. Call your local emergency services (911 in the [place] Do not drive yourself to the hospital.  If you ever feel like you may hurt yourself or others, or have thoughts about taking your own life, get help right away. Go to your nearest emergency department or: Call your local emergency services (911 in the [place] Call the [facility] (561-838-6310 in the [place] Call a suicide crisis helpline, such as the National Suicide Prevention Lifeline at 316-662-0672 or 988 in the [place] This is open 24 hours a day in the [place] Text the Crisis Text Line at 858-677-8260 (in the [place] Summary Opioid medicines can help you manage moderate to severe pain for a short period of time. A pain treatment plan is an agreement between you and your health care provider. Discuss the goals of your treatment, including how much pain you might expect to have and how you will manage the pain. If you think that you or someone else may have taken too much of an opioid, get medical help right away. This information is not intended to replace advice given to you by your health care provider. Make sure you discuss any questions you have with your health care provider. Document Revised: 03/05/2021 Document Reviewed: 11/20/2020 Elsevier Patient Education  2023 [employer].   Advanced directives: Please bring a copy of your health care power of attorney and living will to the office to be added to your chart at your convenience.   Conditions/risks identified: Aim for 30 minutes of exercise or brisk walking, 6-8 glasses of water, and 5 servings of fruits and vegetables each day.   Next appointment: Follow up in one year for your annual wellness visit 12/13/23 @ 8:45 televisit   Preventive Care 65 Years and Older, Female Preventive care refers to lifestyle choices and visits with your health care provider that can  promote health and wellness. What does preventive care include? A yearly physical exam. This is also called an annual well check. Dental exams once or twice a year. Routine eye exams. Ask your health care provider how often you should have your eyes checked. Personal lifestyle choices, including: Daily care of your teeth and gums. Regular physical activity. Eating a healthy diet. Avoiding tobacco and drug use. Limiting alcohol use. Practicing safe sex. Taking low-dose aspirin every day. Taking vitamin and mineral supplements as recommended by your health care provider. What happens during an annual well check? The services and screenings done by your health care provider during your annual well check will depend on your age, overall health, lifestyle  risk factors, and family history of disease. Counseling  Your health care provider may ask you questions about your: Alcohol use. Tobacco use. Drug use. Emotional well-being. Home and relationship well-being. Sexual activity. Eating habits. History of falls. Memory and ability to understand (cognition). Work and work Astronomer. Reproductive health. Screening  You may have the following tests or measurements: Height, weight, and BMI. Blood pressure. Lipid and cholesterol levels. These may be checked every 5 years, or more frequently if you are over 24 years old. Skin check. Lung cancer screening. You may have this screening every year starting at age 52 if you have a 30-pack-year history of smoking and currently smoke or have quit within the past 15 years. Fecal occult blood test (FOBT) of the stool. You may have this test every year starting at age 43. Flexible sigmoidoscopy or colonoscopy. You may have a sigmoidoscopy every 5 years or a colonoscopy every 10 years starting at age 37. Hepatitis C blood test. Hepatitis B blood test. Sexually transmitted disease (STD) testing. Diabetes screening. This is done by checking your  blood sugar (glucose) after you have not eaten for a while (fasting). You may have this done every 1-3 years. Bone density scan. This is done to screen for osteoporosis. You may have this done starting at age 33. Mammogram. This may be done every 1-2 years. Talk to your health care provider about how often you should have regular mammograms. Talk with your health care provider about your test results, treatment options, and if necessary, the need for more tests. Vaccines  Your health care provider may recommend certain vaccines, such as: Influenza vaccine. This is recommended every year. Tetanus, diphtheria, and acellular pertussis (Tdap, Td) vaccine. You may need a Td booster every 10 years. Zoster vaccine. You may need this after age 78. Pneumococcal 13-valent conjugate (PCV13) vaccine. One dose is recommended after age 47. Pneumococcal polysaccharide (PPSV23) vaccine. One dose is recommended after age 38. Talk to your health care provider about which screenings and vaccines you need and how often you need them. This information is not intended to replace advice given to you by your health care provider. Make sure you discuss any questions you have with your health care provider. Document Released: 09/06/2015 Document Revised: 04/29/2016 Document Reviewed: 06/11/2015 Elsevier Interactive Patient Education  2017 [employer].  Fall Prevention in the Home Falls can cause injuries. They can happen to people of all ages. There are many things you can do to make your home safe and to help prevent falls. What can I do on the outside of my home? Regularly fix the edges of walkways and driveways and fix any cracks. Remove anything that might make you trip as you walk through a door, such as a raised step or threshold. Trim any bushes or trees on the path to your home. Use bright outdoor lighting. Clear any walking paths of anything that might make someone trip, such as rocks or tools. Regularly  check to see if handrails are loose or broken. Make sure that both sides of any steps have handrails. Any raised decks and porches should have guardrails on the edges. Have any leaves, snow, or ice cleared regularly. Use sand or salt on walking paths during winter. Clean up any spills in your garage right away. This includes oil or grease spills. What can I do in the bathroom? Use night lights. Install grab bars by the toilet and in the tub and shower. Do not use towel bars as grab  bars. Use non-skid mats or decals in the tub or shower. If you need to sit down in the shower, use a plastic, non-slip stool. Keep the floor dry. Clean up any water that spills on the floor as soon as it happens. Remove soap buildup in the tub or shower regularly. Attach bath mats securely with double-sided non-slip rug tape. Do not have throw rugs and other things on the floor that can make you trip. What can I do in the bedroom? Use night lights. Make sure that you have a light by your bed that is easy to reach. Do not use any sheets or blankets that are too big for your bed. They should not hang down onto the floor. Have a firm chair that has side arms. You can use this for support while you get dressed. Do not have throw rugs and other things on the floor that can make you trip. What can I do in the kitchen? Clean up any spills right away. Avoid walking on wet floors. Keep items that you use a lot in easy-to-reach places. If you need to reach something above you, use a strong step stool that has a grab bar. Keep electrical cords out of the way. Do not use floor polish or wax that makes floors slippery. If you must use wax, use non-skid floor wax. Do not have throw rugs and other things on the floor that can make you trip. What can I do with my stairs? Do not leave any items on the stairs. Make sure that there are handrails on both sides of the stairs and use them. Fix handrails that are broken or loose.  Make sure that handrails are as long as the stairways. Check any carpeting to make sure that it is firmly attached to the stairs. Fix any carpet that is loose or worn. Avoid having throw rugs at the top or bottom of the stairs. If you do have throw rugs, attach them to the floor with carpet tape. Make sure that you have a light switch at the top of the stairs and the bottom of the stairs. If you do not have them, ask someone to add them for you. What else can I do to help prevent falls? Wear shoes that: Do not have high heels. Have rubber bottoms. Are comfortable and fit you well. Are closed at the toe. Do not wear sandals. If you use a stepladder: Make sure that it is fully opened. Do not climb a closed stepladder. Make sure that both sides of the stepladder are locked into place. Ask someone to hold it for you, if possible. Clearly mark and make sure that you can see: Any grab bars or handrails. First and last steps. Where the edge of each step is. Use tools that help you move around (mobility aids) if they are needed. These include: Canes. Walkers. Scooters. Crutches. Turn on the lights when you go into a dark area. Replace any light bulbs as soon as they burn out. Set up your furniture so you have a clear path. Avoid moving your furniture around. If any of your floors are uneven, fix them. If there are any pets around you, be aware of where they are. Review your medicines with your doctor. Some medicines can make you feel dizzy. This can increase your chance of falling. Ask your doctor what other things that you can do to help prevent falls. This information is not intended to replace advice given to you by your health  care provider. Make sure you discuss any questions you have with your health care provider. Document Released: 06/06/2009 Document Revised: 01/16/2016 Document Reviewed: 09/14/2014 Elsevier Interactive Patient Education  2017 [employer].

## 2022-12-09 NOTE — Progress Notes (Signed)
I connected with  [name] on 12/09/22 by a audio enabled telemedicine application and verified that I am speaking with the correct person using two identifiers.  Patient Location: Home  Provider Location: Office/Clinic  I discussed the limitations of evaluation and management by telemedicine. The patient expressed understanding and agreed to proceed.  Subjective:   JONET MATHIES is a 82 y.o. female who presents for Medicare Annual (Subsequent) preventive examination.  Review of Systems      Cardiac Risk Factors include: advanced age (>74men, >77 women);sedentary lifestyle;hypertension     Objective:    Today's Vitals   12/09/22 0905 12/09/22 0906  Weight: 178 lb (80.7 kg)   Height: 5' 3.75" (1.619 m)   PainSc:  5    Body mass index is 30.79 kg/m.     12/09/2022    9:22 AM 07/15/2022    2:39 PM 07/03/2022   12:03 PM 12/03/2021    9:21 AM 10/30/2020   12:11 PM 01/04/2020   12:40 PM 01/03/2020    7:54 PM  Advanced Directives  Does Patient Have a Medical Advance Directive? Yes Yes Yes Yes Yes No No  Type of Estate agent of [place];Living will Healthcare Power of [place];Living will Healthcare Power of [place];Living will Healthcare Power of [place];Living will Healthcare Power of [place];Living will    Does patient want to make changes to medical advance directive?  No - Patient declined       Copy of Healthcare Power of Attorney in Chart? No - copy requested No - copy requested  No - copy requested No - copy requested    Would patient like information on creating a medical advance directive?      No - Patient declined No - Patient declined    Current Medications (verified) Outpatient Encounter Medications as of 12/09/2022  Medication Sig   acetaminophen (TYLENOL) 500 MG tablet Take 1,000 mg by mouth 2 (two) times daily as needed for moderate pain or headache.    albuterol (VENTOLIN HFA) 108 (90 Base) MCG/ACT inhaler INHALE 1 PUFF INTO THE  LUNGS EVERY 6 (SIX) HOURS AS NEEDED (FOR COUGH).   aspirin EC 81 MG tablet Take 1 tablet (81 mg total) by mouth daily. Start after you complete taking Clopidogrel ( Plavix) Swallow whole.   atorvastatin (LIPITOR) 20 MG tablet TAKE 1 TABLET BY MOUTH EVERY DAY   benzonatate (TESSALON) 200 MG capsule TAKE 1 CAPSULE (200 MG TOTAL) BY MOUTH 3 (THREE) TIMES DAILY AS NEEDED FOR COUGH.   busPIRone (BUSPAR) 15 MG tablet TAKE 1 TABLET BY MOUTH EVERY DAY   cetirizine (ZYRTEC) 10 MG tablet TAKE 1 TABLET BY MOUTH EVERY DAY   fluticasone (FLONASE) 50 MCG/ACT nasal spray PLACE 1 SPRAY INTO BOTH NOSTRILS DAILY AS NEEDED.   HYDROcodone-acetaminophen (NORCO/VICODIN) 5-325 MG tablet Take 1 tablet by mouth 3 (three) times daily as needed for moderate pain.   losartan (COZAAR) 25 MG tablet TAKE 1 TABLET BY MOUTH EVERY DAY   Melatonin 3 MG CAPS Take 6 mg by mouth at bedtime.    methocarbamol (ROBAXIN) 500 MG tablet TAKE 1 TABLET BY MOUTH EVERY 8 HOURS AS NEEDED FOR MUSCLE SPASMS   metoprolol tartrate (LOPRESSOR) 25 MG tablet TAKE 1 TABLET BY MOUTH TWICE A DAY   nystatin (MYCOSTATIN/NYSTOP) powder Apply topically 2 (two) times daily as needed. To affected areas to prevent yeast (in skin folds)   pantoprazole (PROTONIX) 40 MG tablet TAKE 1 TABLET BY MOUTH EVERY DAY   sertraline (ZOLOFT)  25 MG tablet TAKE 1 TABLET (25 MG TOTAL) BY MOUTH DAILY.   Vitamin D, Cholecalciferol, 25 MCG (1000 UT) CAPS Take 2,000 Units by mouth in the morning and at bedtime.    nitroGLYCERIN (NITROSTAT) 0.4 MG SL tablet Place 1 tablet (0.4 mg total) under the tongue every 5 (five) minutes as needed. (Patient not taking: Reported on 05/13/2022)   No facility-administered encounter medications on file as of 12/09/2022.    Allergies (verified) Sulfonamide derivatives, Ace inhibitors, Hydrochlorothiazide, Oxaprozin, Pregabalin, and Amoxicillin-pot clavulanate   History: Past Medical History:  Diagnosis Date   Anemia    of chronic disease    Anxiety    Arthritis    "psoratic arthritis; new dx" (09/11/2016)   CAD S/P percutaneous coronary angioplasty 08/2016   mLCx 90% -> 0% (2.75 mm x 16 mm) Synergy DES PCI Ostial D1 70% (not PTCA target). Moderate ostial and mD2.  LAD tandem 50% stenoses w/ negative FFR (0.86)   Cancer    skin cancer on leg   Chronic lower back pain    Complication of anesthesia 1980 and 1982   trouble waking up after breast biopsy, many surgeries since no problem   Depression    Dyspnea on effort - CHRONIC    partial relief of Sx post PCI of L Circumflex.   Fibrocystic breast    GERD (gastroesophageal reflux disease)    Headache    "monthly" (09/11/2016)   History of blood transfusion    "several since age 72; I've had them after most of my surgeries" (09/11/2016)   History of myocardial infarction     - Myoview [name] 2018 indicated small Inferior - Inferoseptal Infarct (vs. rest ischemia). Echo with inferoseptal hypokinesis.   Hx of skin cancer, basal cell 2013 and 2012   right inner, lower leg; several scattered around my body   Hypertension    Iron deficiency anemia    "I've had an iron infusion"   Osteoarthritis    Osteopenia    Peptic ulcer disease    H pylori   PMR (polymyalgia rheumatica)    PONV (postoperative nausea and vomiting)    Psoriasis    TIA (transient ischemic attack) 06/03/2020   Symptoms of right arm weakness/numbness and slurred speech as well as right droopm => MRI brain/MRA head normal.  Normal echo normal carotid Dopplers.   Past Surgical History:  Procedure Laterality Date   APPENDECTOMY     BACK SURGERY     x 6    BREAST BIOPSY Left 1980 and 1982   "benign"   CARDIAC CATHETERIZATION N/A 09/11/2016   Procedure: Left Heart Cath and Coronary Angiography;  Surgeon: Marykay Lex, MD;  Location: [facility] INVASIVE CV LAB;  Service: Cardiovascular: mLCx 90% * (PCI). Ostial D1 70% (not PTCA target). Mod ostial and mD2.  LAD tandem 50% stenoses w/ negative FFR (0.86)   CARDIAC  CATHETERIZATION N/A 09/11/2016   Procedure: Intravascular Pressure Wire/FFR Study;  Surgeon: Marykay Lex, MD;  Location: [facility] INVASIVE CV LAB;  Service: Cardiovascular: --  LAD tandem 50% stenoses w/ negative FFR (0.86   CARDIAC CATHETERIZATION N/A 09/11/2016   Procedure: Coronary Stent Intervention;  Surgeon: Marykay Lex, MD;  Location: [facility] INVASIVE CV LAB;  Service: Cardiovascular: -- DES PCI mLCx 90%--0% SYNERGY DES 2.75 MM X 16 MM.   CARDIAC CATHETERIZATION  2001   non-obstructive CAD   CATARACT EXTRACTION W/ INTRAOCULAR LENS  IMPLANT, BILATERAL  July -  August 2017   DILATION AND CURETTAGE  OF UTERUS  X 3   ESOPHAGOGASTRODUODENOSCOPY  03/2009   ulcer,HP, GERD stricture   EXCISIONAL HEMORRHOIDECTOMY     HIP ARTHROPLASTY Left 2010   JOINT REPLACEMENT     KNEE ARTHROSCOPY Bilateral    LAPAROSCOPIC CHOLECYSTECTOMY  2005   LUMBAR DISC SURGERY  11/1984; 1987; 1989; 2004; 2005 X 2   deg. disk lumber spine    MOHS SURGERY Right    "inside my lower leg"   NASAL SINUS SURGERY  1970s   NM MYOVIEW LTD  08/28/2016   INTERMEDIATE RISK (b/c reduced EF & ? small infarct) -- EF ~35-40%.  ? Prior Small Inferior-apical & septal infarct (w/o ischemia).  Difuse HK - worse in inferoapex.  Suggest prior infarct with no ischemia. Ischemic area correlates with echo WMA   NM MYOVIEW LTD  05/27/2021   Findings consistent with prior anteroapical infarct with fixed defect.  No peri-infarct ischemia.  Notable improvement from prior study.:   ORIF FEMUR FRACTURE Right 01/04/2020   Procedure: OPEN REDUCTION INTERNAL FIXATION (ORIF) DISTAL FEMUR FRACTURE;  Surgeon: Samson Frederic, MD;  Location: [facility] OR;  Service: Orthopedics;  Laterality: Right;   REVERSE SHOULDER ARTHROPLASTY Left 12/17/2017   Procedure: LEFT REVERSE SHOULDER ARTHROPLASTY;  Surgeon: Beverely Low, MD;  Location: [facility] OR;  Service: Orthopedics;  Laterality: Left;   TOTAL HIP ARTHROPLASTY Right 04/04/2013   Procedure: RIGHT TOTAL HIP ARTHROPLASTY  ANTERIOR APPROACH;  Surgeon: Shelda Pal, MD;  Location: [facility] ORS;  Service: Orthopedics;  Laterality: Right;   TOTAL KNEE ARTHROPLASTY Right 2009   TRANSTHORACIC ECHOCARDIOGRAM  1/'18; 1/'19   a) 1/'18: mild conc LVH. EF 45-50% - anteroseptal & inferoseptal HK. Mod AI.; b) 1/'19 - post PCI: (post PCI) --> EF normalized:  Normal LV systolic function; mild diastolic dysfunction; mild   LVH; sclerotic aortic valve with mild AI.  No RWMA   TRANSTHORACIC ECHOCARDIOGRAM  06/04/2020    for TIA: Normal LV size and function.  No or WMA.  EF 60 to 65%.  GR 1 DD with elevated LAP.  Normal RV size and function.  Normal RVP, RAP.  Mild aortic valve calcification-sclerosis but no stenosis.  Trivial AI.  No embolic source   TUBAL LIGATION     VAGINAL HYSTERECTOMY  1970s   partial    Family History  Problem Relation Age of Onset   Arthritis Mother    Emphysema Mother    Hyperlipidemia Mother    Hypertension Mother    Heart disease Mother    Heart disease Brother        CAD   Diabetes Paternal Grandmother    Heart disease Maternal Aunt    Social History   Socioeconomic History   Marital status: Widowed    Spouse name: Not on file   Number of children: 5   Years of education: Not on file   Highest education level: Not on file  Occupational History   Not on file  Tobacco Use   Smoking status: Never   Smokeless tobacco: Never  Vaping Use   Vaping Use: Never used  Substance and Sexual Activity   Alcohol use: Yes    Alcohol/week: 0.0 standard drinks of alcohol    Comment: 09/11/2016 "might have a margarita q 3-4 months"   Drug use: No   Sexual activity: Not Currently  Other Topics Concern   Not on file  Social History Narrative   Now Widowed as of [name] 2020. Birthed 5 children. Lost one at age 67.  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   9 grandchildren    Social Determinants of Health   Financial Resource Strain: Low Risk  (12/09/2022)   Overall Financial Resource Strain (CARDIA)    Difficulty of Paying Living Expenses: Not hard at all  Food Insecurity: No Food Insecurity (12/09/2022)   Hunger Vital Sign    Worried About Running Out of Food in the Last Year: Never true    Ran Out of Food in the Last Year: Never true  Transportation Needs: No Transportation Needs (12/09/2022)   PRAPARE - Administrator, Civil Service (Medical): No    Lack of Transportation (Non-Medical): No  Physical Activity: Inactive (12/09/2022)   Exercise Vital Sign    Days of Exercise per Week: 0 days    Minutes of Exercise per Session: 0 min  Stress: No Stress Concern Present (12/09/2022)   [employer] of Occupational Health - Occupational Stress Questionnaire    Feeling of Stress : Not at all  Social Connections: Moderately Isolated (12/09/2022)   Social Connection and Isolation Panel [NHANES]    Frequency of Communication with Friends and Family: More than three times a week    Frequency of Social Gatherings with Friends and Family: More than three times a week    Attends Religious Services: More than 4 times per year    Active Member of [employer] or Organizations: No    Attends Banker Meetings: Never    Marital Status: Widowed    Tobacco Counseling Counseling given: Not Answered   Clinical Intake:  Pre-visit preparation completed: Yes  Pain : 0-10 Pain Score: 5  Pain Type: Chronic pain Pain Location: Generalized Pain Descriptors / Indicators: Aching, Sharp Pain Onset: More than a month ago     Nutritional Risks: None Diabetes: No  How often do you need to have someone help you when you read instructions, pamphlets, or other written materials from your doctor or pharmacy?: 1 - Never  Diabetic? no  Interpreter Needed?: No  Information entered by :: C.Khamauri Bauernfeind LPN   Activities of  Daily Living    12/09/2022    9:25 AM  In your present state of health, do you have any difficulty performing the following activities:  Hearing? 0  Vision? 0  Difficulty concentrating or making decisions? 0  Walking or climbing stairs? 1  Dressing or bathing? 0  Doing errands, shopping? 0  Preparing Food and eating ? N  Using the Toilet? N  In the past six months, have you accidently leaked urine? Y  Comment wears depends  Do you have problems with loss of bowel control? N  Managing your Medications? N  Managing your Finances? N  Housekeeping or managing your Housekeeping? N    Patient Care Team: Tower, Audrie Gallus, MD as PCP - General Herbie [place] Piedad Climes, MD as PCP - Cardiology (Cardiology) Sheran Luz, MD as Consulting Physician (Physical Medicine and Rehabilitation) Zenovia Jordan, MD as Consulting Physician (Rheumatology) [place]ver[place], Elvin So, MD as Referring Physician (Dermatology) Serena Croissant, MD as Consulting Physician (Hematology and Oncology)  Indicate any recent Medical Services you may have received from other than [facility] providers in the past year (date may be approximate).     Assessment:   This is a routine wellness  examination for Aleysia.  Hearing/Vision screen Hearing Screening - Comments:: No aids Vision Screening - Comments:: Glasses - Unknown provider  Dietary issues and exercise activities discussed: Current Exercise Habits: The patient does not participate in regular exercise at present, Exercise limited by: None identified   Goals Addressed             This Visit's Progress    Patient Stated       Maintain current health status.       Depression Screen    12/09/2022    9:22 AM 03/11/2022   11:52 AM 12/03/2021    9:09 AM 11/04/2021   11:00 AM 10/30/2020   12:15 PM 07/22/2020   11:32 AM 10/27/2019   11:50 AM  PHQ 2/9 Scores  PHQ - 2 Score 0 0 0 0 0 0 0  PHQ- 9 Score     0  0    Fall Risk    12/09/2022    9:24 AM 03/11/2022   11:52 AM  12/03/2021    9:24 AM 11/04/2021   11:00 AM 10/30/2020   12:14 PM  Fall Risk   Falls in the past year? 1 0 0 0 1  Number falls in past yr: 1  0 0 0  Comment lost balance fell backwards      Injury with Fall? 0  0 0 1  Comment     broke her femur  Risk for fall due to : No Fall Risks;Medication side effect;Impaired mobility;Impaired balance/gait  Impaired balance/gait;Impaired mobility  Medication side effect;Impaired balance/gait  Follow up Falls prevention discussed;Falls evaluation completed Falls evaluation completed Falls prevention discussed Falls evaluation completed Falls evaluation completed;Falls prevention discussed    FALL RISK PREVENTION PERTAINING TO THE HOME:  Any stairs in or around the home? Yes  If so, are there any without handrails? Yes  Home free of loose throw rugs in walkways, pet beds, electrical cords, etc? Yes  Adequate lighting in your home to reduce risk of falls? Yes   ASSISTIVE DEVICES UTILIZED TO PREVENT FALLS:  Life alert? No  Use of a cane, walker or w/c? Yes  Grab bars in the bathroom? No  Shower chair or bench in shower? Yes  Elevated toilet seat or a handicapped toilet? Yes    Cognitive Function:    10/30/2020   12:18 PM 10/27/2019   11:53 AM 10/19/2018    9:48 AM 10/12/2017    9:50 AM 08/11/2016   12:00 PM  MMSE - Mini Mental State Exam  Orientation to time 5 5 5 5 5   Orientation to Place 5 5 5 5 5   Registration 3 3 3 3 3   Attention/ Calculation 5 5 0 0 0  Recall 3 3 3 3 3   Language- name 2 objects   0 0 0  Language- repeat 1 1 1 1 1   Language- follow 3 step command   3 3 3   Language- read & follow direction   0 0 0  Write a sentence   0 0 0  Copy design   0 0 0  Total score   20 20 20         12/09/2022    9:27 AM  6CIT Screen  What Year? 0 points  What month? 0 points  What time? 0 points  Count back from 20 0 points  Months in reverse 0 points  Repeat phrase 0 points  Total Score 0 points    Immunizations Immunization  History  Administered Date(s)  Administered   Fluad Quad(high Dose 65+) 06/30/2019, 05/06/2020   Influenza, High Dose Seasonal PF 06/30/2021   Influenza,inj,Quad PF,6+ Mos 07/17/2013, 09/12/2016, 10/12/2017, 10/25/2018   Influenza-Unspecified 05/26/2021   PFIZER(Purple Top)SARS-COV-2 Vaccination 09/14/2019, 10/05/2019, 08/13/2020   Pneumococcal Conjugate-13 10/12/2017   Pneumococcal Polysaccharide-23 01/26/2008, 04/16/2010   Td 03/28/1998, 01/26/2008   Unspecified SARS-COV-2 Vaccination 05/26/2021    TDAP status: Due, Education has been provided regarding the importance of this vaccine. Advised may receive this vaccine at local pharmacy or Health Dept. Aware to provide a copy of the vaccination record if obtained from local pharmacy or Health Dept. Verbalized acceptance and understanding.  Flu Vaccine status: Up to date  Pneumococcal vaccine status: Up to date  Covid-19 vaccine status: Information provided on how to obtain vaccines.   Qualifies for Shingles Vaccine? Yes   Zostavax completed Yes   Shingrix Completed?: No.    Education has been provided regarding the importance of this vaccine. Patient has been advised to call insurance company to determine out of pocket expense if they have not yet received this vaccine. Advised may also receive vaccine at local pharmacy or Health Dept. Verbalized acceptance and understanding.  Screening Tests Health Maintenance  Topic Date Due   Zoster Vaccines- Shingrix (1 of 2) Never done   DTaP/Tdap/Td (3 - Tdap) 01/25/2018   COVID-19 Vaccine (5 - 2023-24 season) 04/24/2022   INFLUENZA VACCINE  03/25/2023   MAMMOGRAM  11/19/2023   Medicare Annual Wellness (AWV)  12/09/2023   Pneumonia Vaccine 18+ Years old  Completed   DEXA SCAN  Completed   HPV VACCINES  Aged Out    Health Maintenance  Health Maintenance Due  Topic Date Due   Zoster Vaccines- Shingrix (1 of 2) Never done   DTaP/Tdap/Td (3 - Tdap) 01/25/2018   COVID-19 Vaccine (5 -  2023-24 season) 04/24/2022    Colorectal cancer screening: No longer required.   Mammogram status: Completed 11/19/22. Repeat every year  Bone Density status: Completed 11/19/22. Results reflect: Bone density results: OSTEOPENIA. Repeat every 2 years.  Lung Cancer Screening: (Low Dose CT Chest recommended if Age 43-80 years, 30 pack-year currently smoking OR have quit w/in 15years.) does not qualify.   Lung Cancer Screening Referral: no  Additional Screening:  Hepatitis C Screening: does not qualify; Completed no  Vision Screening: Recommended annual ophthalmology exams for early detection of glaucoma and other disorders of the eye. Is the patient up to date with their annual eye exam?  No , pt will call for appointment. Who is the provider or what is the name of the office in which the patient attends annual eye exams? Unknown provider If pt is not established with a provider, would they like to be referred to a provider to establish care? No .   Dental Screening: Recommended annual dental exams for proper oral hygiene  Community Resource Referral / Chronic Care Management: CRR required this visit?  No   CCM required this visit?  No      Plan:     I have personally reviewed and noted the following in the patient's chart:   Medical and social history Use of alcohol, tobacco or illicit drugs  Current medications and supplements including opioid prescriptions. Patient is currently taking opioid prescriptions. Information provided to patient regarding non-opioid alternatives. Patient advised to discuss non-opioid treatment plan with their provider. Functional ability and status Nutritional status Physical activity Advanced directives List of other physicians Hospitalizations, surgeries, and ER visits in previous 12 months Vitals Screenings  to include cognitive, depression, and falls Referrals and appointments  In addition, I have reviewed and discussed with patient  certain preventive protocols, quality metrics, and best practice recommendations. A written personalized care plan for preventive services as well as general preventive health recommendations were provided to patient.     Maryan Puls, LPN   1/61/0960   Nurse Notes: none

## 2022-12-14 ENCOUNTER — Telehealth: Payer: Self-pay | Admitting: Family Medicine

## 2022-12-14 DIAGNOSIS — E669 Obesity, unspecified: Secondary | ICD-10-CM

## 2022-12-14 DIAGNOSIS — R7989 Other specified abnormal findings of blood chemistry: Secondary | ICD-10-CM

## 2022-12-14 DIAGNOSIS — E785 Hyperlipidemia, unspecified: Secondary | ICD-10-CM

## 2022-12-14 DIAGNOSIS — E559 Vitamin D deficiency, unspecified: Secondary | ICD-10-CM

## 2022-12-14 DIAGNOSIS — Z131 Encounter for screening for diabetes mellitus: Secondary | ICD-10-CM

## 2022-12-14 DIAGNOSIS — R7303 Prediabetes: Secondary | ICD-10-CM | POA: Insufficient documentation

## 2022-12-14 DIAGNOSIS — D638 Anemia in other chronic diseases classified elsewhere: Secondary | ICD-10-CM

## 2022-12-14 DIAGNOSIS — I10 Essential (primary) hypertension: Secondary | ICD-10-CM

## 2022-12-14 DIAGNOSIS — Z79899 Other long term (current) drug therapy: Secondary | ICD-10-CM

## 2022-12-14 NOTE — Telephone Encounter (Signed)
-----   Message from Alvina Chou sent at 12/07/2022  3:16 PM EDT ----- Regarding: Lab orders for Thursday, 4.25.24 Patient is scheduled for CPX labs, please order future labs, Thanks , Camelia Eng

## 2022-12-15 DIAGNOSIS — M13811 Other specified arthritis, right shoulder: Secondary | ICD-10-CM | POA: Diagnosis not present

## 2022-12-17 ENCOUNTER — Other Ambulatory Visit (INDEPENDENT_AMBULATORY_CARE_PROVIDER_SITE_OTHER): Payer: Medicare Other

## 2022-12-17 ENCOUNTER — Other Ambulatory Visit: Payer: Self-pay | Admitting: Family Medicine

## 2022-12-17 DIAGNOSIS — D638 Anemia in other chronic diseases classified elsewhere: Secondary | ICD-10-CM | POA: Diagnosis not present

## 2022-12-17 DIAGNOSIS — Z131 Encounter for screening for diabetes mellitus: Secondary | ICD-10-CM | POA: Diagnosis not present

## 2022-12-17 DIAGNOSIS — I10 Essential (primary) hypertension: Secondary | ICD-10-CM | POA: Diagnosis not present

## 2022-12-17 DIAGNOSIS — R7989 Other specified abnormal findings of blood chemistry: Secondary | ICD-10-CM

## 2022-12-17 DIAGNOSIS — J302 Other seasonal allergic rhinitis: Secondary | ICD-10-CM

## 2022-12-17 DIAGNOSIS — E785 Hyperlipidemia, unspecified: Secondary | ICD-10-CM

## 2022-12-17 DIAGNOSIS — Z79899 Other long term (current) drug therapy: Secondary | ICD-10-CM

## 2022-12-17 DIAGNOSIS — E559 Vitamin D deficiency, unspecified: Secondary | ICD-10-CM | POA: Diagnosis not present

## 2022-12-17 LAB — LIPID PANEL
Cholesterol: 153 mg/dL (ref 0–200)
HDL: 46.9 mg/dL (ref >39.00)
LDL Cholesterol: 78 mg/dL (ref 0–99)
NonHDL: 106.46
Total CHOL/HDL Ratio: 3
Triglycerides: 144 mg/dL (ref 0.0–149.0)
VLDL: 28.8 mg/dL (ref 0.0–40.0)

## 2022-12-17 LAB — COMPREHENSIVE METABOLIC PANEL
ALT: 9 U/L (ref 0–35)
AST: 17 U/L (ref 0–37)
Albumin: 3.9 g/dL (ref 3.5–5.2)
Alkaline Phosphatase: 57 U/L (ref 39–117)
BUN: 22 mg/dL (ref 6–23)
CO2: 28 mEq/L (ref 19–32)
Calcium: 9.1 mg/dL (ref 8.4–10.5)
Chloride: 105 mEq/L (ref 96–112)
Creatinine, Ser: 0.99 mg/dL (ref 0.40–1.20)
GFR: 52.8 mL/min — ABNORMAL LOW (ref >60.00)
Glucose, Bld: 89 mg/dL (ref 70–99)
Potassium: 4.4 mEq/L (ref 3.5–5.1)
Sodium: 141 mEq/L (ref 135–145)
Total Bilirubin: 0.4 mg/dL (ref 0.2–1.2)
Total Protein: 6.9 g/dL (ref 6.0–8.3)

## 2022-12-17 LAB — CBC WITH DIFFERENTIAL/PLATELET
Basophils Absolute: 0.1 10*3/uL (ref 0.0–0.1)
Basophils Relative: 0.8 % (ref 0.0–3.0)
Eosinophils Absolute: 0 10*3/uL (ref 0.0–0.7)
Eosinophils Relative: 0.4 % (ref 0.0–5.0)
HCT: 33.6 % — ABNORMAL LOW (ref 36.0–46.0)
Hemoglobin: 11 g/dL — ABNORMAL LOW (ref 12.0–15.0)
Lymphocytes Relative: 19.7 % (ref 12.0–46.0)
Lymphs Abs: 1.4 10*3/uL (ref 0.7–4.0)
MCHC: 32.8 g/dL (ref 30.0–36.0)
MCV: 89.7 fl (ref 78.0–100.0)
Monocytes Absolute: 0.3 10*3/uL (ref 0.1–1.0)
Monocytes Relative: 4 % (ref 3.0–12.0)
Neutro Abs: 5.5 10*3/uL (ref 1.4–7.7)
Neutrophils Relative %: 75.1 % (ref 43.0–77.0)
Platelets: 209 10*3/uL (ref 150.0–400.0)
RBC: 3.75 Mil/uL — ABNORMAL LOW (ref 3.87–5.11)
RDW: 14.1 % (ref 11.5–15.5)
WBC: 7.3 10*3/uL (ref 4.0–10.5)

## 2022-12-17 LAB — VITAMIN D 25 HYDROXY (VIT D DEFICIENCY, FRACTURES): VITD: 59.48 ng/mL (ref 30.00–100.00)

## 2022-12-17 LAB — HEMOGLOBIN A1C: Hgb A1c MFr Bld: 5.7 % (ref 4.6–6.5)

## 2022-12-17 LAB — VITAMIN B12: Vitamin B-12: 365 pg/mL (ref 211–911)

## 2022-12-17 LAB — T4, FREE: Free T4: 0.63 ng/dL (ref 0.60–1.60)

## 2022-12-17 LAB — TSH: TSH: 2.29 u[IU]/mL (ref 0.35–5.50)

## 2022-12-18 ENCOUNTER — Other Ambulatory Visit: Payer: Self-pay | Admitting: Family Medicine

## 2022-12-18 ENCOUNTER — Encounter: Payer: Self-pay | Admitting: Cardiology

## 2022-12-18 ENCOUNTER — Ambulatory Visit: Payer: Medicare Other | Attending: Cardiology | Admitting: Cardiology

## 2022-12-18 VITALS — BP 180/86 | HR 66 | Ht 63.0 in | Wt 172.4 lb

## 2022-12-18 DIAGNOSIS — I10 Essential (primary) hypertension: Secondary | ICD-10-CM

## 2022-12-18 DIAGNOSIS — Z9861 Coronary angioplasty status: Secondary | ICD-10-CM

## 2022-12-18 DIAGNOSIS — I447 Left bundle-branch block, unspecified: Secondary | ICD-10-CM | POA: Diagnosis not present

## 2022-12-18 DIAGNOSIS — I2510 Atherosclerotic heart disease of native coronary artery without angina pectoris: Secondary | ICD-10-CM | POA: Diagnosis not present

## 2022-12-18 DIAGNOSIS — E785 Hyperlipidemia, unspecified: Secondary | ICD-10-CM | POA: Diagnosis not present

## 2022-12-18 MED ORDER — ATORVASTATIN CALCIUM 40 MG PO TABS: 40.0000 mg | ORAL_TABLET | Freq: Every day | ORAL | 3 refills | Status: DC

## 2022-12-18 NOTE — Patient Instructions (Signed)
Medication Instructions:   Stop taking Atorvastatin 20 mg    Start taking Atorvastatin 40 mg at bedtime   Take an extra dose of Losartan when you get home today   *If you need a refill on your cardiac medications before your next appointment, please call your pharmacy*   Lab Work: Not needed If you have labs (blood work) drawn today and your tests are completely normal, you will receive your results only by: MyChart Message (if you have MyChart) OR A paper copy in the mail If you have any lab test that is abnormal or we need to change your treatment, we will call you to review the results.   Testing/Procedures: Not needed  Follow-Up: At [facility], you and your health needs are our priority.  As part of our continuing mission to provide you with exceptional heart care, we have created designated Provider Care Teams.  These Care Teams include your primary Cardiologist (physician) and Advanced Practice Providers (APPs -  Physician Assistants and Nurse Practitioners) who all work together to provide you with the care you need, when you need it.     Your next appointment:   7 to 8 month(s)   NOv/Dec 2024  The format for your next appointment:   In Person  Provider:   Bryan Lemma, MD

## 2022-12-18 NOTE — Assessment & Plan Note (Signed)
Blood pressure is high today.  It has been much better controlled at other visits.  I have asked that she take her next dose of losartan today.  If her pressures continue to be elevated, my recommendation be to go ahead and increase her standing dose of losartan to 50 mg.  Will try to avoid diuretic given her advanced age.  Low-salt diet, trying to get exercise

## 2022-12-18 NOTE — Assessment & Plan Note (Signed)
Pretty asymptomatic.  Stable.  Not new.  Was evaluated with a nonischemic Myoview when this was first noted.Marland KitchenMarland Kitchen

## 2022-12-18 NOTE — Assessment & Plan Note (Addendum)
Just over 6 years out from LCx PCI for DOE --> not really having any chest pain.  Exertional dyspnea is probably more just related to deconditioning at this point.  She notes more bruising on aspirin that she is on Plavix. => I gave her the option of switching back to Plavix and being off aspirin she wants to see how it does over the next several months, but notes that she will take it under advisement and consider.  Continue aspirin monotherapy for now, okay to hold 5 to 7 days preop for surgeries or procedures.

## 2022-12-18 NOTE — Telephone Encounter (Signed)
Med last filled on 08/25/22 #90 tabs/ 3 refills, CPE scheduled 12/22/22

## 2022-12-18 NOTE — Assessment & Plan Note (Signed)
Lipids not being at goal, will increase from 20 to 40 mg atorvastatin.  If she does not tolerate this increase, would switch to rosuvastatin 20 mg.

## 2022-12-18 NOTE — Assessment & Plan Note (Signed)
LAD and LCx disease with LCx PCI, and medical management of mild to moderate LAD disease (FFR negative).  Follow-up Myoview was also negative in October 2022.  No evidence of macrovascular ischemia.  Overall doing well with no active angina.  Dyspnea is probably more related to deconditioning and immobility.  Plan: Currently on aspirin monotherapy along with atorvastatin 20 mg daily. Lipids not being at goal, will increase from 20 to 40 mg atorvastatin.  If she does not tolerate this increase, would switch to rosuvastatin 20 mg.   Would need recheck in about 3 to 4 months. On Lopressor 25 mg BID & losartan 25 mg daily.  (For her elevated blood pressure today, would recommend that she take an extra dose of losartan today.

## 2022-12-18 NOTE — Progress Notes (Signed)
Primary Care Provider: Judy Pimple, MD [facility] Cardiologist: Bryan Lemma, MD Electrophysiologist: None  Clinic Note: Chief Complaint  Patient presents with   Follow-up    Doing well.  BP little bit high today.  Did not sleep well last night, just drink a couple coffee.   Coronary Artery Disease    No angina pain.  Having more bruising with aspirin that she did with Plavix.    ===================================  ASSESSMENT/PLAN   Problem List Items Addressed This Visit       Cardiology Problems   Hyperlipidemia with target low density lipoprotein (LDL) cholesterol less than 70 mg/dL (Chronic)    Lipids not being at goal, will increase from 20 to 40 mg atorvastatin.  If she does not tolerate this increase, would switch to rosuvastatin 20 mg.       Relevant Medications   atorvastatin (LIPITOR) 40 MG tablet   Essential hypertension (Chronic)    Blood pressure is high today.  It has been much better controlled at other visits.  I have asked that she take her next dose of losartan today.  If her pressures continue to be elevated, my recommendation be to go ahead and increase her standing dose of losartan to 50 mg.  Will try to avoid diuretic given her advanced age.  Low-salt diet, trying to get exercise      Relevant Medications   atorvastatin (LIPITOR) 40 MG tablet   Coronary artery disease involving native heart without angina pectoris - Primary (Chronic)    LAD and LCx disease with LCx PCI, and medical management of mild to moderate LAD disease (FFR negative).  Follow-up Myoview was also negative in October 2022.  No evidence of macrovascular ischemia.  Overall doing well with no active angina.  Dyspnea is probably more related to deconditioning and immobility.  Plan: Currently on aspirin monotherapy along with atorvastatin 20 mg daily. Lipids not being at goal, will increase from 20 to 40 mg atorvastatin.  If she does not tolerate this increase, would  switch to rosuvastatin 20 mg.   Would need recheck in about 3 to 4 months. On Lopressor 25 mg BID & losartan 25 mg daily.  (For her elevated blood pressure today, would recommend that she take an extra dose of losartan today.      Relevant Medications   atorvastatin (LIPITOR) 40 MG tablet   Other Relevant Orders   EKG 12-Lead (Completed)   CAD S/P percutaneous coronary angioplasty (Chronic)    Just over 6 years out from LCx PCI for DOE --> not really having any chest pain.  Exertional dyspnea is probably more just related to deconditioning at this point.  She notes more bruising on aspirin that she is on Plavix. => I gave her the option of switching back to Plavix and being off aspirin she wants to see how it does over the next several months, but notes that she will take it under advisement and consider.  Continue aspirin monotherapy for now, okay to hold 5 to 7 days preop for surgeries or procedures.      Relevant Medications   atorvastatin (LIPITOR) 40 MG tablet   Other Relevant Orders   EKG 12-Lead (Completed)     Other   Left bundle branch block (LBBB) on electrocardiogram (Chronic)    Pretty asymptomatic.  Stable.  Not new.  Was evaluated with a nonischemic Myoview when this was first noted...      Relevant Orders   EKG 12-Lead (Completed)    ===================================  HPI:    ANNALEI FRIESZ is a 82 y.o. female with a PMH below who presents today for 65-month follow-up at the request of Tower, Audrie Gallus, MD.  Notable Cardiac History / [facility] Dec 2017-January 2018: Evaluated for DOE: Echo EF 45-50% Septal/Inferoseptal HK, Mod AI -> Abnormal Myoview EF ~44% with ? Prior infarct & peri-infarct ischemia CAD- DES PCI mid LCx (90%). Mod LAD Dz @ small Diag (FFR Negative) Myoview October 2022: Nonischemic.  Fixed inferior inferolateral and apical defect consistent with prior infarct. HTN, HLD,  Baseline DOE: CPX January 2020:- Suggested deconditioning.  Recommended PT OT  referral. TIA 06/03/2020 -> recommended DAPT x3 weeks and then Plavix long-term along with statin.  Also placed on propranolol for GER GAD  Chronic Anemia (intermittent Epo injections -now q 12 weeks) Fall with Distal Femur Fxr May 2021    MAIRANY BRUNO was last seen on May 13, 2022: She had her son with her to help her remember things.  She was doing well.  Zollie Scale concerned because her blood pressure was high but that had a couple coffee and had to walk long ways given in the office.  He was better on recheck.  Was doing fairly well with exercise.  No issues with chest discomfort oxygen levels never go below 90%.  Some exertional dyspnea but more related to deconditioning.  Using cane for balance when walking short distances, and she uses a walker for walking longer distances.  Much of the dyspnea is related to right leg pain ever since her femur fracture.  Mild edema but no PND orthopnea. Converted from Plavix back to aspirin.  Maintained on Lopressor 25 mg twice daily and losartan 20 mg daily along with atorvastatin 20 mg.  Recent Hospitalizations:  ER visit July 03, 2022 -> fall with minor head injury.  Reviewed  CV studies:    The following studies were reviewed today: (if available, images/films reviewed: From Epic Chart or Care Everywhere) No new studies  Interval History:   NIKITIA ASBILL returns today in her usual feisty mood.  She is accompanied by her daughter-in-law today.  She had a big cup of coffee in her hand on a walked-in to the clinic room and was again shocked at the level of her blood pressure.  Unfortunately on my recheck, the machine did not work -> it went down to 180/86.  She says that this not usually the case with her.  She is due to see her PCP next week so we can reassess her pressures then.  Last night she did not sleep well.  Most of the night.  Then she distracted by couple coffee.  She does note that she is trying to get more exercise try to do more  walking but the leg really bothers her still.  She still walking with a cane for short distances and a walker for further distances.  She really denies any more than usual exertional dyspnea from the amount of effort to take her to walk.  No chest pain or pressure with exertion.  Really trivial edema.  No PND orthopnea.  CV Review of Symptoms (Summary): positive for - dyspnea on exertion, edema, and exercise intolerance related to leg pain and soreness.  Edema is trivial end of day.  Exertional dyspnea related to effort from walking. negative for - chest pain, irregular heartbeat, orthopnea, palpitations, paroxysmal nocturnal dyspnea, rapid heart rate, shortness of breath, or lightheadedness, dizziness or wooziness, syncope/near syncope.  TIA/amaurosis fugax, claudication  REVIEWED OF SYSTEMS   Review of Systems  Constitutional:  Positive for malaise/fatigue (deconditioned - lack of exercise.  Not sleeping well.). Negative for weight loss.  HENT:  Negative for congestion.   Eyes:        Her vision is getting worse. - >  Needs to get into see her eye doctor to change her prescription.  Respiratory:  Negative for cough, shortness of breath and wheezing.   Cardiovascular:  Negative for leg swelling.  Gastrointestinal:  Negative for abdominal pain, blood in stool and melena.  Genitourinary:  Positive for frequency (Nocturia). Negative for hematuria.  Musculoskeletal:  Positive for falls (She had a couple falls over the 3 months) and joint pain (R hip & upper leg).  Neurological:  Positive for focal weakness (R leg) and headaches. Negative for dizziness.       Poor balance  Endo/Heme/Allergies:  Bruises/bleeds easily (Notes bruising more on aspirin than on Plavix).  Psychiatric/Behavioral:  Positive for memory loss (trivial). Negative for depression. The patient has insomnia. The patient is not nervous/anxious.   All other systems reviewed and are negative.  PAST MEDICAL HISTORY   Past Medical  History:  Diagnosis Date   Anemia    of chronic disease   Anxiety    Arthritis    "psoratic arthritis; new dx" (09/11/2016)   CAD S/P percutaneous coronary angioplasty 08/2016   mLCx 90% -> 0% (2.75 mm x 16 mm) Synergy DES PCI Ostial D1 70% (not PTCA target). Moderate ostial and mD2.  LAD tandem 50% stenoses w/ negative FFR (0.86)   Cancer (HCC)    skin cancer on leg   Chronic lower back pain    Complication of anesthesia 1980 and 1982   trouble waking up after breast biopsy, many surgeries since no problem   Depression    Dyspnea on effort - CHRONIC    partial relief of Sx post PCI of L Circumflex.   Fibrocystic breast    GERD (gastroesophageal reflux disease)    Headache    "monthly" (09/11/2016)   History of blood transfusion    "several since age 6; I've had them after most of my surgeries" (09/11/2016)   History of myocardial infarction     - Myoview [name] 2018 indicated small Inferior - Inferoseptal Infarct (vs. rest ischemia). Echo with inferoseptal hypokinesis.   Hx of skin cancer, basal cell 2013 and 2012   right inner, lower leg; several scattered around my body   Hypertension    Iron deficiency anemia    "I've had an iron infusion"   Osteoarthritis    Osteopenia    Peptic ulcer disease    H pylori   PMR (polymyalgia rheumatica) (HCC)    PONV (postoperative nausea and vomiting)    Psoriasis    TIA (transient ischemic attack) 06/03/2020   Symptoms of right arm weakness/numbness and slurred speech as well as right droopm => MRI brain/MRA head normal.  Normal echo normal carotid Dopplers.    PAST SURGICAL HISTORY   Past Surgical History:  Procedure Laterality Date   APPENDECTOMY     BACK SURGERY     x 6    BREAST BIOPSY Left 1980 and 1982   "benign"   CARDIAC CATHETERIZATION N/A 09/11/2016   Procedure: Left Heart Cath and Coronary Angiography;  Surgeon: Marykay Lex, MD;  Location: [facility] INVASIVE CV LAB;  Service: Cardiovascular: mLCx 90% * (PCI). Ostial D1 70%  (not PTCA target). Mod ostial and mD2.  LAD tandem 50%  stenoses w/ negative FFR (0.86)   CARDIAC CATHETERIZATION N/A 09/11/2016   Procedure: Intravascular Pressure Wire/FFR Study;  Surgeon: Marykay Lex, MD;  Location: [facility] INVASIVE CV LAB;  Service: Cardiovascular: --  LAD tandem 50% stenoses w/ negative FFR (0.86   CARDIAC CATHETERIZATION N/A 09/11/2016   Procedure: Coronary Stent Intervention;  Surgeon: Marykay Lex, MD;  Location: [facility] INVASIVE CV LAB;  Service: Cardiovascular: -- DES PCI mLCx 90%--0% SYNERGY DES 2.75 MM X 16 MM.   CARDIAC CATHETERIZATION  2001   non-obstructive CAD   CATARACT EXTRACTION W/ INTRAOCULAR LENS  IMPLANT, BILATERAL  July -  August 2017   DILATION AND CURETTAGE OF UTERUS  X 3   ESOPHAGOGASTRODUODENOSCOPY  03/2009   ulcer,HP, GERD stricture   EXCISIONAL HEMORRHOIDECTOMY     HIP ARTHROPLASTY Left 2010   JOINT REPLACEMENT     KNEE ARTHROSCOPY Bilateral    LAPAROSCOPIC CHOLECYSTECTOMY  2005   LUMBAR DISC SURGERY  11/1984; 1987; 1989; 2004; 2005 X 2   deg. disk lumber spine    MOHS SURGERY Right    "inside my lower leg"   NASAL SINUS SURGERY  1970s   NM MYOVIEW LTD  08/28/2016   INTERMEDIATE RISK (b/c reduced EF & ? small infarct) -- EF ~35-40%.  ? Prior Small Inferior-apical & septal infarct (w/o ischemia).  Difuse HK - worse in inferoapex.  Suggest prior infarct with no ischemia. Ischemic area correlates with echo WMA   NM MYOVIEW LTD  05/27/2021   Findings consistent with prior anteroapical infarct with fixed defect.  No peri-infarct ischemia.  Notable improvement from prior study.:   ORIF FEMUR FRACTURE Right 01/04/2020   Procedure: OPEN REDUCTION INTERNAL FIXATION (ORIF) DISTAL FEMUR FRACTURE;  Surgeon: Samson Frederic, MD;  Location: [facility] OR;  Service: Orthopedics;  Laterality: Right;   REVERSE SHOULDER ARTHROPLASTY Left 12/17/2017   Procedure: LEFT REVERSE SHOULDER ARTHROPLASTY;  Surgeon: Beverely Low, MD;  Location: [facility] OR;  Service: Orthopedics;   Laterality: Left;   TOTAL HIP ARTHROPLASTY Right 04/04/2013   Procedure: RIGHT TOTAL HIP ARTHROPLASTY ANTERIOR APPROACH;  Surgeon: Shelda Pal, MD;  Location: [facility] ORS;  Service: Orthopedics;  Laterality: Right;   TOTAL KNEE ARTHROPLASTY Right 2009   TRANSTHORACIC ECHOCARDIOGRAM  1/'18; 1/'19   a) 1/'18: mild conc LVH. EF 45-50% - anteroseptal & inferoseptal HK. Mod AI.; b) 1/'19 - post PCI: (post PCI) --> EF normalized:  Normal LV systolic function; mild diastolic dysfunction; mild   LVH; sclerotic aortic valve with mild AI.  No RWMA   TRANSTHORACIC ECHOCARDIOGRAM  06/04/2020    for TIA: Normal LV size and function.  No or WMA.  EF 60 to 65%.  GR 1 DD with elevated LAP.  Normal RV size and function.  Normal RVP, RAP.  Mild aortic valve calcification-sclerosis but no stenosis.  Trivial AI.  No embolic source   TUBAL LIGATION     VAGINAL HYSTERECTOMY  1970s   partial     PCI [name] 2018 -mid LCx 90% - 0% with Synergy DES 2.75 mm right 60 mm; LAD FFR negative.    MEDICATIONS/ALLERGIES   Current Meds  Medication Sig   acetaminophen (TYLENOL) 500 MG tablet Take 1,000 mg by mouth 2 (two) times daily as needed for moderate pain or headache.    albuterol (VENTOLIN HFA) 108 (90 Base) MCG/ACT inhaler INHALE 1 PUFF INTO THE LUNGS EVERY 6 (SIX) HOURS AS NEEDED (FOR COUGH).   aspirin EC 81 MG tablet Take 1 tablet (81 mg total) by  mouth daily. Start after you complete taking Clopidogrel ( Plavix) Swallow whole.   atorvastatin (LIPITOR) 20 MG tablet TAKE 1 TABLET BY MOUTH EVERY DAY   benzonatate (TESSALON) 200 MG capsule TAKE 1 CAPSULE (200 MG TOTAL) BY MOUTH 3 (THREE) TIMES DAILY AS NEEDED FOR COUGH.   busPIRone (BUSPAR) 15 MG tablet TAKE 1 TABLET BY MOUTH EVERY DAY   cetirizine (ZYRTEC) 10 MG tablet TAKE 1 TABLET BY MOUTH EVERY DAY   fluticasone (FLONASE) 50 MCG/ACT nasal spray PLACE 1 SPRAY INTO BOTH NOSTRILS DAILY AS NEEDED.   HYDROcodone-acetaminophen (NORCO/VICODIN) 5-325 MG tablet Take 1 tablet  by mouth 3 (three) times daily as needed for moderate pain.   losartan (COZAAR) 25 MG tablet TAKE 1 TABLET BY MOUTH EVERY DAY   Melatonin 3 MG CAPS Take 6 mg by mouth at bedtime.    methocarbamol (ROBAXIN) 500 MG tablet TAKE 1 TABLET BY MOUTH EVERY 8 HOURS AS NEEDED FOR MUSCLE SPASMS   metoprolol tartrate (LOPRESSOR) 25 MG tablet TAKE 1 TABLET BY MOUTH TWICE A DAY   nystatin (MYCOSTATIN/NYSTOP) powder Apply topically 2 (two) times daily as needed. To affected areas to prevent yeast (in skin folds)   pantoprazole (PROTONIX) 40 MG tablet TAKE 1 TABLET BY MOUTH EVERY DAY   sertraline (ZOLOFT) 25 MG tablet TAKE 1 TABLET (25 MG TOTAL) BY MOUTH DAILY.   Vitamin D, Cholecalciferol, 25 MCG (1000 UT) CAPS Take 2,000 Units by mouth in the morning and at bedtime.     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   Ace Inhibitors     cough   Hydrochlorothiazide     Hyponatremia    Oxaprozin     GI upset    Pregabalin Swelling    SWELLING REACTION UNSPECIFIED    Amoxicillin-Pot Clavulanate Nausea And Vomiting    gi upset Has patient had a PCN reaction causing immediate rash, facial/tongue/throat swelling, SOB or lightheadedness with hypotension: No Has patient had a PCN reaction causing severe rash involving mucus membranes or skin necrosis: No Has patient had a PCN reaction that required hospitalization No Has patient had a PCN reaction occurring within the last 10 years: No If all of the above answers are "NO", then may proceed with Cephalosporin use.     SOCIAL HISTORY/FAMILY HISTORY   Reviewed in Epic:  Pertinent findings:  Social History   Tobacco Use   Smoking status: Never   Smokeless tobacco: Never  Vaping Use   Vaping Use: Never used  Substance Use Topics   Alcohol use: Yes    Alcohol/week: 0.0 standard drinks of alcohol    Comment: 09/11/2016 "might have a margarita q 3-4 months"   Drug use: No   Social  History   Social History Narrative   Now Widowed as of [name] 2020. Birthed 5 children. Lost one at age 61.   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   9 grandchildren     OBJCTIVE -PE, EKG, labs   Wt Readings from Last 3 Encounters:  12/18/22 172 lb 6.4 oz (78.2 kg)  12/09/22 178 lb (80.7 kg)  10/28/22 172 lb 12.8 oz (78.4 kg)    Physical Exam: BP (!) 180/86   Pulse 66  Ht 5\' 3"  (1.6 m)   Wt 172 lb 6.4 oz (78.2 kg)   SpO2 97%   BMI 30.54 kg/m  Vitals:   12/18/22 1016 12/18/22 1127  BP: (!) 190/88 (!) 180/86  Pulse: 66   Height: 5\' 3"  (1.6 m)   Weight: 172 lb 6.4 oz (78.2 kg)   SpO2: 97%   BMI (Calculated): 30.55     Physical Exam Vitals reviewed.  Constitutional:      General: She is not in acute distress.    Appearance: Normal appearance. She is obese. She is not ill-appearing or toxic-appearing.  HENT:     Head: Normocephalic and atraumatic.  Neck:     Vascular: No carotid bruit or JVD.  Cardiovascular:     Rate and Rhythm: Normal rate and regular rhythm. No extrasystoles are present.    Chest Wall: PMI is not displaced.     Pulses: No decreased pulses.     Heart sounds: S1 normal and S2 normal. Murmur heard.     High-pitched harsh crescendo-decrescendo midsystolic murmur is present with a grade of 1/6 at the upper right sternal border radiating to the neck.     No gallop. No S4 sounds.  Pulmonary:     Effort: Pulmonary effort is normal. No respiratory distress.     Breath sounds: Normal breath sounds. No stridor. No wheezing, rhonchi or rales.  Chest:     Chest wall: No tenderness.  Musculoskeletal:        General: Swelling (Trivial) present. Normal range of motion.     Cervical back: Normal range of motion and neck supple.  Neurological:     General: No focal deficit present.     Mental  Status: She is alert and oriented to person, place, and time. Mental status is at baseline.     Gait: Gait abnormal.  Psychiatric:        Mood and Affect: Mood normal.        Behavior: Behavior normal.        Thought Content: Thought content normal.        Judgment: Judgment normal.    Adult ECG Report  Rate: 66 ;  Rhythm: normal sinus rhythm and AIVCD(LBBB pattern). CRO Ant MI, Age-indeterminate  ;   Narrative Interpretation: Somewhat stable.  AIVCD more prominent  Recent Labs:     Lab Results  Component Value Date   CHOL 153 12/17/2022   HDL 46.90 12/17/2022   LDLCALC 78 12/17/2022   LDLDIRECT 75.0 12/02/2021   TRIG 144.0 12/17/2022   CHOLHDL 3 12/17/2022   Lab Results  Component Value Date   CREATININE 0.99 12/17/2022   BUN 22 12/17/2022   NA 141 12/17/2022   K 4.4 12/17/2022   CL 105 12/17/2022   CO2 28 12/17/2022      Latest Ref Rng & Units 12/17/2022    8:28 AM 10/28/2022   10:45 AM 07/15/2022    2:17 PM  CBC  WBC 4.0 - 10.5 K/uL 7.3  4.8  5.5   Hemoglobin 12.0 - 15.0 g/dL 16.1  09.6  9.9   Hematocrit 36.0 - 46.0 % 33.6  32.5  31.4   Platelets 150.0 - 400.0 K/uL 209.0  239  237     Lab Results  Component Value Date   HGBA1C 5.7 12/17/2022   Lab Results  Component Value Date   TSH 2.29 12/17/2022    ================================================== I spent a total of 36 minutes with the patient spent in direct patient  consultation.  Additional time spent with chart review  / charting (studies, outside notes, etc): 21 min Total Time: 57 min  Current medicines are reviewed at length with the patient today.  (+/- concerns) N/A  Notice: This dictation was prepared with Dragon dictation along with smart phrase technology. Any transcriptional errors that result from this process are unintentional and may not be corrected upon review.  Studies Ordered:   Orders Placed This Encounter  Procedures   EKG 12-Lead   Meds ordered this encounter  Medications    atorvastatin (LIPITOR) 40 MG tablet    Sig: Take 1 tablet (40 mg total) by mouth daily.    Dispense:  90 tablet    Refill:  3    Patient Instructions / Medication Changes & Studies & Tests Ordered   Patient Instructions  Medication Instructions:   Stop taking Atorvastatin 20 mg    Start taking Atorvastatin 40 mg at bedtime   Take an extra dose of Losartan when you get home today   *If you need a refill on your cardiac medications before your next appointment, please call your pharmacy*   Lab Work: Not needed If you have labs (blood work) drawn today and your tests are completely normal, you will receive your results only by: MyChart Message (if you have MyChart) OR A paper copy in the mail If you have any lab test that is abnormal or we need to change your treatment, we will call you to review the results.   Testing/Procedures: Not needed  Follow-Up: At [facility], you and your health needs are our priority.  As part of our continuing mission to provide you with exceptional heart care, we have created designated Provider Care Teams.  These Care Teams include your primary Cardiologist (physician) and Advanced Practice Providers (APPs -  Physician Assistants and Nurse Practitioners) who all work together to provide you with the care you need, when you need it.     Your next appointment:   7 to 8 month(s)   NOv/Dec 2024  The format for your next appointment:   In Person  Provider:   Bryan Lemma, MD       Marykay Lex, MD, MS Bryan Lemma, M.D., M.S. Interventional Cardiologist  [facility]  Pager # [phone] Phone # [phone] [address] [place], [place] [address]   Thank you for choosing [facility] [place]!!

## 2022-12-22 ENCOUNTER — Ambulatory Visit (INDEPENDENT_AMBULATORY_CARE_PROVIDER_SITE_OTHER): Payer: Medicare Other | Admitting: Family Medicine

## 2022-12-22 ENCOUNTER — Other Ambulatory Visit: Payer: Self-pay

## 2022-12-22 ENCOUNTER — Emergency Department (HOSPITAL_COMMUNITY): Payer: Medicare Other

## 2022-12-22 ENCOUNTER — Observation Stay (HOSPITAL_COMMUNITY)
Admission: EM | Admit: 2022-12-22 | Discharge: 2022-12-24 | Disposition: A | Discharge status: Home Health | Payer: Medicare Other | Attending: Internal Medicine | Admitting: Internal Medicine

## 2022-12-22 ENCOUNTER — Encounter: Payer: Self-pay | Admitting: Family Medicine

## 2022-12-22 VITALS — BP 139/62 | HR 69 | Temp 97.7°F | Ht 62.75 in | Wt 173.0 lb

## 2022-12-22 DIAGNOSIS — R404 Transient alteration of awareness: Secondary | ICD-10-CM | POA: Diagnosis not present

## 2022-12-22 DIAGNOSIS — N289 Disorder of kidney and ureter, unspecified: Secondary | ICD-10-CM

## 2022-12-22 DIAGNOSIS — Z79899 Other long term (current) drug therapy: Secondary | ICD-10-CM | POA: Insufficient documentation

## 2022-12-22 DIAGNOSIS — R7989 Other specified abnormal findings of blood chemistry: Secondary | ICD-10-CM

## 2022-12-22 DIAGNOSIS — D638 Anemia in other chronic diseases classified elsewhere: Secondary | ICD-10-CM | POA: Diagnosis not present

## 2022-12-22 DIAGNOSIS — Z96643 Presence of artificial hip joint, bilateral: Secondary | ICD-10-CM | POA: Diagnosis not present

## 2022-12-22 DIAGNOSIS — Z955 Presence of coronary angioplasty implant and graft: Secondary | ICD-10-CM | POA: Diagnosis not present

## 2022-12-22 DIAGNOSIS — Z85828 Personal history of other malignant neoplasm of skin: Secondary | ICD-10-CM | POA: Diagnosis not present

## 2022-12-22 DIAGNOSIS — E669 Obesity, unspecified: Secondary | ICD-10-CM

## 2022-12-22 DIAGNOSIS — Z7982 Long term (current) use of aspirin: Secondary | ICD-10-CM | POA: Insufficient documentation

## 2022-12-22 DIAGNOSIS — R531 Weakness: Secondary | ICD-10-CM | POA: Diagnosis not present

## 2022-12-22 DIAGNOSIS — R4781 Slurred speech: Secondary | ICD-10-CM | POA: Diagnosis not present

## 2022-12-22 DIAGNOSIS — E785 Hyperlipidemia, unspecified: Secondary | ICD-10-CM | POA: Diagnosis present

## 2022-12-22 DIAGNOSIS — Z743 Need for continuous supervision: Secondary | ICD-10-CM | POA: Diagnosis not present

## 2022-12-22 DIAGNOSIS — Z96612 Presence of left artificial shoulder joint: Secondary | ICD-10-CM | POA: Diagnosis not present

## 2022-12-22 DIAGNOSIS — I10 Essential (primary) hypertension: Secondary | ICD-10-CM | POA: Diagnosis present

## 2022-12-22 DIAGNOSIS — Z0000 Encounter for general adult medical examination without abnormal findings: Secondary | ICD-10-CM | POA: Diagnosis not present

## 2022-12-22 DIAGNOSIS — I674 Hypertensive encephalopathy: Secondary | ICD-10-CM

## 2022-12-22 DIAGNOSIS — R5383 Other fatigue: Secondary | ICD-10-CM | POA: Diagnosis not present

## 2022-12-22 DIAGNOSIS — T380X5A Adverse effect of glucocorticoids and synthetic analogues, initial encounter: Secondary | ICD-10-CM

## 2022-12-22 DIAGNOSIS — M8580 Other specified disorders of bone density and structure, unspecified site: Secondary | ICD-10-CM

## 2022-12-22 DIAGNOSIS — M353 Polymyalgia rheumatica: Secondary | ICD-10-CM

## 2022-12-22 DIAGNOSIS — K219 Gastro-esophageal reflux disease without esophagitis: Secondary | ICD-10-CM

## 2022-12-22 DIAGNOSIS — Z8673 Personal history of transient ischemic attack (TIA), and cerebral infarction without residual deficits: Secondary | ICD-10-CM | POA: Diagnosis present

## 2022-12-22 DIAGNOSIS — R6889 Other general symptoms and signs: Secondary | ICD-10-CM | POA: Diagnosis not present

## 2022-12-22 DIAGNOSIS — D509 Iron deficiency anemia, unspecified: Secondary | ICD-10-CM | POA: Insufficient documentation

## 2022-12-22 DIAGNOSIS — G459 Transient cerebral ischemic attack, unspecified: Secondary | ICD-10-CM | POA: Diagnosis not present

## 2022-12-22 DIAGNOSIS — R4701 Aphasia: Secondary | ICD-10-CM

## 2022-12-22 DIAGNOSIS — I2510 Atherosclerotic heart disease of native coronary artery without angina pectoris: Secondary | ICD-10-CM

## 2022-12-22 DIAGNOSIS — Z96651 Presence of right artificial knee joint: Secondary | ICD-10-CM | POA: Insufficient documentation

## 2022-12-22 DIAGNOSIS — Z131 Encounter for screening for diabetes mellitus: Secondary | ICD-10-CM | POA: Diagnosis not present

## 2022-12-22 DIAGNOSIS — I6523 Occlusion and stenosis of bilateral carotid arteries: Secondary | ICD-10-CM | POA: Diagnosis not present

## 2022-12-22 DIAGNOSIS — F411 Generalized anxiety disorder: Secondary | ICD-10-CM

## 2022-12-22 DIAGNOSIS — R8271 Bacteriuria: Secondary | ICD-10-CM | POA: Insufficient documentation

## 2022-12-22 DIAGNOSIS — E559 Vitamin D deficiency, unspecified: Secondary | ICD-10-CM

## 2022-12-22 DIAGNOSIS — R29818 Other symptoms and signs involving the nervous system: Secondary | ICD-10-CM | POA: Diagnosis not present

## 2022-12-22 LAB — PROTIME-INR
INR: 1.1 (ref 0.8–1.2)
Prothrombin Time: 14 seconds (ref 11.4–15.2)

## 2022-12-22 LAB — COMPREHENSIVE METABOLIC PANEL
ALT: 15 U/L (ref 0–44)
AST: 21 U/L (ref 15–41)
Albumin: 3.4 g/dL — ABNORMAL LOW (ref 3.5–5.0)
Alkaline Phosphatase: 50 U/L (ref 38–126)
Anion gap: 9 (ref 5–15)
BUN: 19 mg/dL (ref 8–23)
CO2: 24 mmol/L (ref 22–32)
Calcium: 8.8 mg/dL — ABNORMAL LOW (ref 8.9–10.3)
Chloride: 103 mmol/L (ref 98–111)
Creatinine, Ser: 1.02 mg/dL — ABNORMAL HIGH (ref 0.44–1.00)
GFR, Estimated: 55 mL/min — ABNORMAL LOW (ref >60)
Glucose, Bld: 124 mg/dL — ABNORMAL HIGH (ref 70–99)
Potassium: 4.1 mmol/L (ref 3.5–5.1)
Sodium: 136 mmol/L (ref 135–145)
Total Bilirubin: 0.7 mg/dL (ref 0.3–1.2)
Total Protein: 6.5 g/dL (ref 6.5–8.1)

## 2022-12-22 LAB — CBC WITH DIFFERENTIAL/PLATELET
Abs Immature Granulocytes: 0.04 10*3/uL (ref 0.00–0.07)
Basophils Absolute: 0.1 10*3/uL (ref 0.0–0.1)
Basophils Relative: 1 %
Eosinophils Absolute: 0.1 10*3/uL (ref 0.0–0.5)
Eosinophils Relative: 1 %
HCT: 33.4 % — ABNORMAL LOW (ref 36.0–46.0)
Hemoglobin: 10.7 g/dL — ABNORMAL LOW (ref 12.0–15.0)
Immature Granulocytes: 0 %
Lymphocytes Relative: 18 %
Lymphs Abs: 1.8 10*3/uL (ref 0.7–4.0)
MCH: 29.4 pg (ref 26.0–34.0)
MCHC: 32 g/dL (ref 30.0–36.0)
MCV: 91.8 fL (ref 80.0–100.0)
Monocytes Absolute: 0.7 10*3/uL (ref 0.1–1.0)
Monocytes Relative: 7 %
Neutro Abs: 7.3 10*3/uL (ref 1.7–7.7)
Neutrophils Relative %: 73 %
Platelets: 217 10*3/uL (ref 150–400)
RBC: 3.64 MIL/uL — ABNORMAL LOW (ref 3.87–5.11)
RDW: 12.6 % (ref 11.5–15.5)
WBC: 10 10*3/uL (ref 4.0–10.5)
nRBC: 0 % (ref 0.0–0.2)

## 2022-12-22 LAB — I-STAT CHEM 8, ED
BUN: 21 mg/dL (ref 8–23)
Calcium, Ion: 1.12 mmol/L — ABNORMAL LOW (ref 1.15–1.40)
Chloride: 103 mmol/L (ref 98–111)
Creatinine, Ser: 0.9 mg/dL (ref 0.44–1.00)
Glucose, Bld: 118 mg/dL — ABNORMAL HIGH (ref 70–99)
HCT: 32 % — ABNORMAL LOW (ref 36.0–46.0)
Hemoglobin: 10.9 g/dL — ABNORMAL LOW (ref 12.0–15.0)
Potassium: 4.2 mmol/L (ref 3.5–5.1)
Sodium: 135 mmol/L (ref 135–145)
TCO2: 24 mmol/L (ref 22–32)

## 2022-12-22 LAB — LIPASE, BLOOD: Lipase: 47 U/L (ref 11–51)

## 2022-12-22 LAB — APTT: aPTT: 27 seconds (ref 24–36)

## 2022-12-22 LAB — TROPONIN I (HIGH SENSITIVITY): Troponin I (High Sensitivity): 6 ng/L (ref <18)

## 2022-12-22 LAB — C-REACTIVE PROTEIN: CRP: <0.5 mg/dL (ref <1.0)

## 2022-12-22 LAB — TSH: TSH: 2.419 u[IU]/mL (ref 0.350–4.500)

## 2022-12-22 LAB — ETHANOL: Alcohol, Ethyl (B): <10 mg/dL (ref <10)

## 2022-12-22 MED ORDER — CLOPIDOGREL BISULFATE 300 MG PO TABS
300.0000 mg | ORAL_TABLET | Freq: Once | ORAL | Status: AC
  Administered 2022-12-23: 300 mg via ORAL
  Filled 2022-12-22: qty 1

## 2022-12-22 MED ORDER — STROKE: EARLY STAGES OF RECOVERY BOOK: Freq: Once | Status: DC

## 2022-12-22 MED ORDER — CLOPIDOGREL BISULFATE 75 MG PO TABS
75.0000 mg | ORAL_TABLET | Freq: Every day | ORAL | Status: DC
  Administered 2022-12-23 – 2022-12-24 (×2): 75 mg via ORAL
  Filled 2022-12-22 (×3): qty 1

## 2022-12-22 MED ORDER — HYDRALAZINE HCL 25 MG PO TABS
25.0000 mg | ORAL_TABLET | Freq: Once | ORAL | Status: AC
  Administered 2022-12-23: 25 mg via ORAL
  Filled 2022-12-22: qty 1

## 2022-12-22 NOTE — Assessment & Plan Note (Signed)
With past PUD Continues protonix 40 mg daily  Vit D and B12 levels are in nl range

## 2022-12-22 NOTE — Assessment & Plan Note (Signed)
bp in fair control at this time  BP Readings from Last 1 Encounters:  12/22/22 139/62   No changes needed/ up to date with cardiology care  Most recent labs reviewed  Disc lifstyle change with low sodium diet and exercise  Plan to continue losartan 25 mg daily  Metoprolol 25 mg bid

## 2022-12-22 NOTE — Assessment & Plan Note (Signed)
No flares  No prednisone currently

## 2022-12-22 NOTE — ED Triage Notes (Addendum)
Patient arrived with EMS from home reports fatigue/generalized weakness with dizziness this evening ( approx. 1800)  , CBG= 127 , hypertensive BP= 180/86.

## 2022-12-22 NOTE — ED Provider Notes (Signed)
  Provider Note MRN:  [id number]  Arrival date & time: 12/23/22    ED Course and Medical Decision Making  Assumed care from Dr. Doran Durand at shift change.  Confusion and word finding difficulty and upper extremity weakness awaiting CTA and MRI, neurology following in consultation.  2 AM update: CTA and MRI without acute stroke.  Per Dr. Iver Nestle of neurology will admit to medicine for possible TIA.  Procedures  Final Clinical Impressions(s) / ED Diagnoses     ICD-10-CM   1. Weakness  R53.1     2. Aphasia  R47.01       ED Discharge Orders     None       Discharge Instructions   None     Elmer Sow. Pilar Plate, MD [facility] Emergency Medicine [facility] [email or web address]    Sabas Sous, MD 12/23/22 [phone]

## 2022-12-22 NOTE — Code Documentation (Signed)
Stroke Response Nurse Documentation Code Documentation  [name] [name] is a 82 y.o. female arriving to [facility]  via [employer] [facility] on 4/30 with past medical hx of TIA, CAD, HTN, HLD, Cancer. On aspirin 81 mg daily. Code stroke was activated by ED.   Patient from home where she was LKW at 1800 and now complaining of bilateral arm weakness and trouble finding words.   Stroke team at the bedside on patient arrival. Labs drawn and patient cleared for CT by Dr. Doran Durand. Patient to CT with team. NIHSS 0, see documentation for details and code stroke times. Patient with No deficits on exam. The following imaging was completed:  CT Head. Patient is not a candidate for IV Thrombolytic due to resolving symptoms. Patient is not a candidate for IR due to low suspicion of LVO.   Care Plan: neuro checks q2  Bedside handoff with ED RN Joselyn Glassman.    Rose Fillers  Rapid Response RN

## 2022-12-22 NOTE — Assessment & Plan Note (Signed)
Continues care with cardiology  Currently off plavix and on asa  Metoprolol and fair bp  Statin for cholesterol

## 2022-12-22 NOTE — Progress Notes (Signed)
Subjective:    Patient ID: [name] [name], female    DOB: [birth year], 82 y.o.   MRN: [id number]  HPI Here for health maintenance exam and to review chronic medical problems    Wt Readings from Last 3 Encounters:  12/22/22 173 lb (78.5 kg)  12/18/22 172 lb 6.4 oz (78.2 kg)  12/09/22 178 lb (80.7 kg)   30.89 kg/m  Vitals:   12/22/22 1135 12/22/22 1202  BP: (!) 146/80 139/62  Pulse: 69   Temp: 97.7 F (36.5 C)   SpO2: 96%     Doing ok  Feeling ok  Taking care of herself     Immunization History  Administered Date(s) Administered   Fluad Quad(high Dose 65+) 06/30/2019, 05/06/2020   Influenza, High Dose Seasonal PF 06/30/2021   Influenza,inj,Quad PF,6+ Mos 07/17/2013, 09/12/2016, 10/12/2017, 10/25/2018   Influenza-Unspecified 05/26/2021   PFIZER(Purple Top)SARS-COV-2 Vaccination 09/14/2019, 10/05/2019, 08/13/2020   Pneumococcal Conjugate-13 10/12/2017   Pneumococcal Polysaccharide-23 01/26/2008, 04/16/2010   Td 03/28/1998, 01/26/2008   Unspecified SARS-COV-2 Vaccination 05/26/2021   Health Maintenance Due  Topic Date Due   Zoster Vaccines- Shingrix (1 of 2) Never done   DTaP/Tdap/Td (3 - Tdap) 01/25/2018   COVID-19 Vaccine (5 - 2023-24 season) 04/24/2022   Amw -done   Utd cardiology care   Shingrix: interested if covered   Tetanus shot last was 09   Mammogram 10/2022 Self breast exam: no lumps  Declines breast exam    Dexa 10/2022 osteopenia fairly stable  Falls: had a fall in October / stepped backwards and lost her cane  Has a walker/uses some times Is very careful not to fall  Fractures- has in past/none new  Supplements - taking vit D  Last vitamin D Lab Results  Component Value Date   VD25OH 59.48 12/17/2022    Exercise : limited    Colon cancer screening -no symptoms    Mood  .    12/09/2022    9:22 AM 03/11/2022   11:52 AM 12/03/2021    9:09 AM 11/04/2021   11:00 AM 10/30/2020   12:15 PM  Depression screen PHQ 2/9  Decreased  Interest 0 0 0 0 0  Down, Depressed, Hopeless 0 0 0 0 0  PHQ - 2 Score 0 0 0 0 0  Altered sleeping     0  Tired, decreased energy     0  Change in appetite     0  Feeling bad or failure about yourself      0  Trouble concentrating     0  Moving slowly or fidgety/restless     0  Suicidal thoughts     0  PHQ-9 Score     0  Difficult doing work/chores     Not difficult at all       No data to display          Some stress Worries about her son - he is having knee surgery    Zoloft 25 mg daily     HTN in setting of CAD/ has cardiology care  bp is stable today  No cp or palpitations or headaches or edema  No side effects to medicines  BP Readings from Last 3 Encounters:  12/22/22 139/62  12/18/22 (!) 180/86  10/28/22 (!) 138/58    Losartan 25 mg daily  Metoprolol 25 mg bid   Is off plavix and taking asa    Last metabolic panel Lab Results  Component Value Date   GLUCOSE  89 12/17/2022   NA 141 12/17/2022   K 4.4 12/17/2022   CL 105 12/17/2022   CO2 28 12/17/2022   BUN 22 12/17/2022   CREATININE 0.99 12/17/2022   GFRNONAA 52 (L) 10/28/2022   CALCIUM 9.1 12/17/2022   PHOS 4.0 04/16/2010   PROT 6.9 12/17/2022   ALBUMIN 3.9 12/17/2022   BILITOT 0.4 12/17/2022   ALKPHOS 57 12/17/2022   AST 17 12/17/2022   ALT 9 12/17/2022   ANIONGAP 6 10/28/2022  GFR of 52.80  Anemia if chronic dz Seeing hematology  Lab Results  Component Value Date   WBC 7.3 12/17/2022   HGB 11.0 (L) 12/17/2022   HCT 33.6 (L) 12/17/2022   MCV 89.7 12/17/2022   PLT 209.0 12/17/2022  Tx with retacrit    GERD Past PUD  Prontonix 40 mg daily   Lab Results  Component Value Date   VITAMINB12 365 12/17/2022    Hyperlipidemia Lab Results  Component Value Date   CHOL 153 12/17/2022   CHOL 160 12/02/2021   CHOL 159 10/31/2020   Lab Results  Component Value Date   HDL 46.90 12/17/2022   HDL 41.50 12/02/2021   HDL 46.70 10/31/2020   Lab Results  Component Value Date    LDLCALC 78 12/17/2022   LDLCALC 74 10/31/2020   LDLCALC 55 10/27/2019   Lab Results  Component Value Date   TRIG 144.0 12/17/2022   TRIG 212.0 (H) 12/02/2021   TRIG 188.0 (H) 10/31/2020   Lab Results  Component Value Date   CHOLHDL 3 12/17/2022   CHOLHDL 4 12/02/2021   CHOLHDL 3 10/31/2020   Lab Results  Component Value Date   LDLDIRECT 75.0 12/02/2021   LDLDIRECT 35.0 10/19/2018   LDLDIRECT 74.0 10/12/2017  Atorvastatin 40 mg daily   Does not eat a healthy diet  Candidly admits this Not motivated    Lab Results  Component Value Date   TSH 2.29 12/17/2022  Was elevated prior/now no FT4 of 0.63  DM screen Lab Results  Component Value Date   HGBA1C 5.7 12/17/2022    Patient Active Problem List   Diagnosis Date Noted   Diabetes mellitus screening 12/14/2022   Elevated TSH 12/09/2021   Coronary artery disease involving native heart without angina pectoris 05/02/2021   Current use of proton pump inhibitor 10/29/2020   History of transient ischemic attack (TIA) 06/04/2020   Normochromic normocytic anemia    History of femur fracture 02/04/2020   Renal insufficiency 11/01/2019   Pedal edema 10/04/2018   S/P shoulder replacement, left 12/17/2017   S/P reverse total shoulder arthroplasty, left 12/17/2017   Routine general medical examination at a health care facility 10/14/2017   Preop cardiovascular exam 10/01/2017   CAD S/P percutaneous coronary angioplasty 01/27/2017   Aortic regurgitation 01/27/2017   Dyspnea on exertion 08/19/2016   Left bundle branch block (LBBB) on electrocardiogram 08/19/2016   Estrogen deficiency 10/08/2015   Chronic fatigue 08/20/2015   Deficiency anemia 11/17/2013   Encounter for Medicare annual wellness exam 07/17/2013   PMR (polymyalgia rheumatica) (HCC) 07/17/2013   Intertrigo 04/19/2013   Obesity (BMI 30-39.9) 04/05/2013   S/P right THA, AA 04/04/2013   IBS (irritable bowel syndrome) 05/30/2012   Anemia of chronic disease  04/16/2010   Generalized anxiety disorder 04/16/2010   GERD 03/04/2009   Vitamin D deficiency 03/12/2008   URINARY TRACT INFECTION, RECURRENT 08/07/2007   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   FIBROCYSTIC BREAST DISEASE, HX OF 02/11/2007   Hyperlipidemia with target low density lipoprotein (  LDL) cholesterol less than 70 mg/dL 66/44/0347   DEPRESSION 01/10/2007   RETINAL VEIN OCCLUSION 01/10/2007   Essential hypertension 01/10/2007   PEPTIC ULCER DISEASE 01/10/2007   Shoulder arthritis 01/10/2007   SKIN CANCER, HX OF 01/10/2007   Localized, primary osteoarthritis of shoulder region 01/10/2007   Past Medical History:  Diagnosis Date   Anemia    of chronic disease   Anxiety    Arthritis    "psoratic arthritis; new dx" (09/11/2016)   CAD S/P percutaneous coronary angioplasty 08/2016   mLCx 90% -> 0% (2.75 mm x 16 mm) Synergy DES PCI Ostial D1 70% (not PTCA target). Moderate ostial and mD2.  LAD tandem 50% stenoses w/ negative FFR (0.86)   Cancer (HCC)    skin cancer on leg   Chronic lower back pain    Complication of anesthesia 1980 and 1982   trouble waking up after breast biopsy, many surgeries since no problem   Depression    Dyspnea on effort - CHRONIC    partial relief of Sx post PCI of L Circumflex.   Fibrocystic breast    GERD (gastroesophageal reflux disease)    Headache    "monthly" (09/11/2016)   History of blood transfusion    "several since age 36; I've had them after most of my surgeries" (09/11/2016)   History of myocardial infarction     - Myoview [name] 2018 indicated small Inferior - Inferoseptal Infarct (vs. rest ischemia). Echo with inferoseptal hypokinesis.   Hx of skin cancer, basal cell 2013 and 2012   right inner, lower leg; several scattered around my body   Hypertension    Iron deficiency anemia    "I've had an iron infusion"   Osteoarthritis    Osteopenia    Peptic ulcer disease    H pylori   PMR (polymyalgia rheumatica) (HCC)    PONV  (postoperative nausea and vomiting)    Psoriasis    TIA (transient ischemic attack) 06/03/2020   Symptoms of right arm weakness/numbness and slurred speech as well as right droopm => MRI brain/MRA head normal.  Normal echo normal carotid Dopplers.   Past Surgical History:  Procedure Laterality Date   APPENDECTOMY     BACK SURGERY     x 6    BREAST BIOPSY Left 1980 and 1982   "benign"   CARDIAC CATHETERIZATION N/A 09/11/2016   Procedure: Left Heart Cath and Coronary Angiography;  Surgeon: Marykay Lex, MD;  Location: [facility] INVASIVE CV LAB;  Service: Cardiovascular: mLCx 90% * (PCI). Ostial D1 70% (not PTCA target). Mod ostial and mD2.  LAD tandem 50% stenoses w/ negative FFR (0.86)   CARDIAC CATHETERIZATION N/A 09/11/2016   Procedure: Intravascular Pressure Wire/FFR Study;  Surgeon: Marykay Lex, MD;  Location: [facility] INVASIVE CV LAB;  Service: Cardiovascular: --  LAD tandem 50% stenoses w/ negative FFR (0.86   CARDIAC CATHETERIZATION N/A 09/11/2016   Procedure: Coronary Stent Intervention;  Surgeon: Marykay Lex, MD;  Location: [facility] INVASIVE CV LAB;  Service: Cardiovascular: -- DES PCI mLCx 90%--0% SYNERGY DES 2.75 MM X 16 MM.   CARDIAC CATHETERIZATION  2001   non-obstructive CAD   CATARACT EXTRACTION W/ INTRAOCULAR LENS  IMPLANT, BILATERAL  July -  August 2017   DILATION AND CURETTAGE OF UTERUS  X 3   ESOPHAGOGASTRODUODENOSCOPY  03/2009   ulcer,HP, GERD stricture   EXCISIONAL HEMORRHOIDECTOMY     HIP ARTHROPLASTY Left 2010   JOINT REPLACEMENT     KNEE ARTHROSCOPY Bilateral  LAPAROSCOPIC CHOLECYSTECTOMY  2005   LUMBAR DISC SURGERY  11/1984; 1987; 1989; 2004; 2005 X 2   deg. disk lumber spine    MOHS SURGERY Right    "inside my lower leg"   NASAL SINUS SURGERY  1970s   NM MYOVIEW LTD  08/28/2016   INTERMEDIATE RISK (b/c reduced EF & ? small infarct) -- EF ~35-40%.  ? Prior Small Inferior-apical & septal infarct (w/o ischemia).  Difuse HK - worse in inferoapex.  Suggest prior  infarct with no ischemia. Ischemic area correlates with echo WMA   NM MYOVIEW LTD  05/27/2021   Findings consistent with prior anteroapical infarct with fixed defect.  No peri-infarct ischemia.  Notable improvement from prior study.:   ORIF FEMUR FRACTURE Right 01/04/2020   Procedure: OPEN REDUCTION INTERNAL FIXATION (ORIF) DISTAL FEMUR FRACTURE;  Surgeon: Samson Frederic, MD;  Location: [facility] OR;  Service: Orthopedics;  Laterality: Right;   REVERSE SHOULDER ARTHROPLASTY Left 12/17/2017   Procedure: LEFT REVERSE SHOULDER ARTHROPLASTY;  Surgeon: Beverely Low, MD;  Location: [facility] OR;  Service: Orthopedics;  Laterality: Left;   TOTAL HIP ARTHROPLASTY Right 04/04/2013   Procedure: RIGHT TOTAL HIP ARTHROPLASTY ANTERIOR APPROACH;  Surgeon: Shelda Pal, MD;  Location: [facility] ORS;  Service: Orthopedics;  Laterality: Right;   TOTAL KNEE ARTHROPLASTY Right 2009   TRANSTHORACIC ECHOCARDIOGRAM  1/'18; 1/'19   a) 1/'18: mild conc LVH. EF 45-50% - anteroseptal & inferoseptal HK. Mod AI.; b) 1/'19 - post PCI: (post PCI) --> EF normalized:  Normal LV systolic function; mild diastolic dysfunction; mild   LVH; sclerotic aortic valve with mild AI.  No RWMA   TRANSTHORACIC ECHOCARDIOGRAM  06/04/2020    for TIA: Normal LV size and function.  No or WMA.  EF 60 to 65%.  GR 1 DD with elevated LAP.  Normal RV size and function.  Normal RVP, RAP.  Mild aortic valve calcification-sclerosis but no stenosis.  Trivial AI.  No embolic source   TUBAL LIGATION     VAGINAL HYSTERECTOMY  1970s   partial    Social History   Tobacco Use   Smoking status: Never   Smokeless tobacco: Never  Vaping Use   Vaping Use: Never used  Substance Use Topics   Alcohol use: Yes    Alcohol/week: 0.0 standard drinks of alcohol    Comment: 09/11/2016 "might have a margarita q 3-4 months"   Drug use: No   Family History  Problem Relation Age of Onset   Arthritis Mother    Emphysema Mother    Hyperlipidemia Mother    Hypertension Mother     Heart disease Mother    Heart disease Brother        CAD   Diabetes Paternal Grandmother    Heart disease Maternal Aunt    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   Ace Inhibitors     cough   Hydrochlorothiazide     Hyponatremia    Oxaprozin     GI upset    Pregabalin Swelling    SWELLING REACTION UNSPECIFIED    Amoxicillin-Pot Clavulanate Nausea And Vomiting    gi upset Has patient had a PCN reaction causing immediate rash, facial/tongue/throat swelling, SOB or lightheadedness with hypotension: No Has patient had a PCN reaction causing severe rash involving mucus membranes or skin necrosis: No Has patient had a PCN reaction that required hospitalization No Has patient had a PCN reaction occurring within the last  10 years: No If all of the above answers are "NO", then may proceed with Cephalosporin use.    Current Outpatient Medications on File Prior to Visit  Medication Sig Dispense Refill   acetaminophen (TYLENOL) 500 MG tablet Take 1,000 mg by mouth 2 (two) times daily as needed for moderate pain or headache.      albuterol (VENTOLIN HFA) 108 (90 Base) MCG/ACT inhaler INHALE 1 PUFF INTO THE LUNGS EVERY 6 (SIX) HOURS AS NEEDED (FOR COUGH). 6.7 each 1   aspirin EC 81 MG tablet Take 1 tablet (81 mg total) by mouth daily. Start after you complete taking Clopidogrel ( Plavix) Swallow whole. 90 tablet 3   atorvastatin (LIPITOR) 40 MG tablet Take 1 tablet (40 mg total) by mouth daily. 90 tablet 3   benzonatate (TESSALON) 200 MG capsule TAKE 1 CAPSULE (200 MG TOTAL) BY MOUTH 3 (THREE) TIMES DAILY AS NEEDED FOR COUGH. 90 capsule 1   busPIRone (BUSPAR) 15 MG tablet TAKE 1 TABLET BY MOUTH EVERY DAY 90 tablet 1   cetirizine (ZYRTEC) 10 MG tablet TAKE 1 TABLET BY MOUTH EVERY DAY 90 tablet 1   fluticasone (FLONASE) 50 MCG/ACT nasal spray PLACE 1 SPRAY INTO BOTH NOSTRILS DAILY AS NEEDED. 48 mL 1   HYDROcodone-acetaminophen  (NORCO/VICODIN) 5-325 MG tablet Take 1 tablet by mouth 3 (three) times daily as needed for moderate pain. 30 tablet 0   losartan (COZAAR) 25 MG tablet TAKE 1 TABLET BY MOUTH EVERY DAY 90 tablet 3   Melatonin 3 MG CAPS Take 6 mg by mouth at bedtime.      methocarbamol (ROBAXIN) 500 MG tablet TAKE 1 TABLET BY MOUTH EVERY 8 HOURS AS NEEDED FOR MUSCLE SPASM 90 tablet 0   metoprolol tartrate (LOPRESSOR) 25 MG tablet TAKE 1 TABLET BY MOUTH TWICE A DAY 180 tablet 1   nystatin (MYCOSTATIN/NYSTOP) powder Apply topically 2 (two) times daily as needed. To affected areas to prevent yeast (in skin folds) 30 g 1   pantoprazole (PROTONIX) 40 MG tablet TAKE 1 TABLET BY MOUTH EVERY DAY 90 tablet 3   sertraline (ZOLOFT) 25 MG tablet TAKE 1 TABLET (25 MG TOTAL) BY MOUTH DAILY. 90 tablet 0   Vitamin D, Cholecalciferol, 25 MCG (1000 UT) CAPS Take 2,000 Units by mouth in the morning and at bedtime.      nitroGLYCERIN (NITROSTAT) 0.4 MG SL tablet Place 1 tablet (0.4 mg total) under the tongue every 5 (five) minutes as needed. (Patient not taking: Reported on 05/13/2022) 25 tablet 3   No current facility-administered medications on file prior to visit.    Review of Systems  Constitutional:  Positive for fatigue. Negative for activity change, appetite change, fever and unexpected weight change.  HENT:  Negative for congestion, ear pain, rhinorrhea, sinus pressure and sore throat.   Eyes:  Negative for pain, redness and visual disturbance.  Respiratory:  Negative for cough, shortness of breath and wheezing.   Cardiovascular:  Negative for chest pain and palpitations.  Gastrointestinal:  Negative for abdominal pain, blood in stool, constipation and diarrhea.  Endocrine: Negative for polydipsia and polyuria.  Genitourinary:  Negative for dysuria, frequency and urgency.  Musculoskeletal:  Positive for arthralgias and back pain. Negative for myalgias.  Skin:  Negative for pallor and rash.  Allergic/Immunologic: Negative  for environmental allergies.  Neurological:  Negative for dizziness, syncope and headaches.  Hematological:  Negative for adenopathy. Does not bruise/bleed easily.  Psychiatric/Behavioral:  Negative for decreased concentration and dysphoric mood. The patient is nervous/anxious.  Objective:   Physical Exam Constitutional:      General: She is not in acute distress.    Appearance: Normal appearance. She is well-developed. She is obese. She is not ill-appearing or diaphoretic.  HENT:     Head: Normocephalic and atraumatic.     Right Ear: Tympanic membrane, ear canal and external ear normal.     Left Ear: Tympanic membrane, ear canal and external ear normal.     Nose: Nose normal. No congestion.     Mouth/Throat:     Mouth: Mucous membranes are moist.     Pharynx: Oropharynx is clear. No posterior oropharyngeal erythema.  Eyes:     General: No scleral icterus.    Extraocular Movements: Extraocular movements intact.     Conjunctiva/sclera: Conjunctivae normal.     Pupils: Pupils are equal, round, and reactive to light.  Neck:     Thyroid: No thyromegaly.     Vascular: No carotid bruit or JVD.  Cardiovascular:     Rate and Rhythm: Normal rate and regular rhythm.     Pulses: Normal pulses.     Heart sounds: Normal heart sounds.     No gallop.  Pulmonary:     Effort: Pulmonary effort is normal. No respiratory distress.     Breath sounds: Normal breath sounds. No wheezing.     Comments: Good air exch Chest:     Chest wall: No tenderness.  Abdominal:     General: Bowel sounds are normal. There is no distension or abdominal bruit.     Palpations: Abdomen is soft. There is no mass.     Tenderness: There is no abdominal tenderness.     Hernia: No hernia is present.  Genitourinary:    Comments: Pt decliens breast exam  Musculoskeletal:        General: No tenderness. Normal range of motion.     Cervical back: Normal range of motion and neck supple. No rigidity. No muscular  tenderness.     Right lower leg: No edema.     Left lower leg: No edema.     Comments: No kyphosis   Lymphadenopathy:     Cervical: No cervical adenopathy.  Skin:    General: Skin is warm and dry.     Coloration: Skin is not pale.     Findings: No erythema or rash.  Neurological:     Mental Status: She is alert. Mental status is at baseline.     Cranial Nerves: No cranial nerve deficit.     Motor: No abnormal muscle tone.     Coordination: Coordination normal.     Gait: Gait normal.     Deep Tendon Reflexes: Reflexes are normal and symmetric.  Psychiatric:        Mood and Affect: Mood normal.        Cognition and Memory: Cognition and memory normal.           Assessment & Plan:   Problem List Items Addressed This Visit       Cardiovascular and Mediastinum   Coronary artery disease involving native heart without angina pectoris (Chronic)    Continues care with cardiology  Currently off plavix and on asa  Metoprolol and fair bp  Statin for cholesterol      Essential hypertension (Chronic)    bp in fair control at this time  BP Readings from Last 1 Encounters:  12/22/22 139/62  No changes needed/ up to date with cardiology care  Most recent labs reviewed  Disc lifstyle change with low sodium diet and exercise  Plan to continue losartan 25 mg daily  Metoprolol 25 mg bid         Digestive   GERD    With past PUD Continues protonix 40 mg daily  Vit D and B12 levels are in nl range        Musculoskeletal and Integument   Steroid-induced osteopenia    Dexa reviewed 10/2022 One fall No new fractures (leg fx in the past)  D level is therapeutic Limited exercise /encouraged to do some chair strength exercise if tolerated         Genitourinary   Renal insufficiency    GFR 52.8  Encouraged fluid intake /nsaid avoidance        Other   Anemia of chronic disease (Chronic)    Continues hematology care  Last hb of 11.0  Tx with retacrit and doing well       Hyperlipidemia with target low density lipoprotein (LDL) cholesterol less than 70 mg/dL (Chronic)    Disc goals for lipids and reasons to control them Rev last labs with pt Rev low sat fat diet in detail  LLD of 78 Plan to continue atorvastatin 40 mg which she is tolerating Not motivated to eat better       Current use of proton pump inhibitor    Lab Results  Component Value Date   VITAMINB12 365 12/17/2022  Last vitamin D Lab Results  Component Value Date   VD25OH 59.48 12/17/2022        Diabetes mellitus screening    Lab Results  Component Value Date   HGBA1C 5.7 12/17/2022  Encouraged low glycemic diet       Elevated TSH    No longer elevated Lab Results  Component Value Date   TSH 2.29 12/17/2022    Nl FT4      Generalized anxiety disorder    Fairly stable  Some family worries PHQ for depression is 0  Continues zoloft 25 mg daily       Obesity (BMI 30-39.9)    Encouraged low glycemic diet and strength building exercise as tolerated       PMR (polymyalgia rheumatica) (HCC)    No flares  No prednisone currently       Routine general medical examination at a health care facility - Primary    Reviewed health habits including diet and exercise and skin cancer prevention Reviewed appropriate screening tests for age  Also reviewed health mt list, fam hx and immunization status , as well as social and family history   See HPI Labs reviewed and ordered Amw utd Interested in shingrix and tetnus update at pharm-she will schedule Mammogram utd 10/2022 Dexa utd 10/2022    discussed fall prevention and exercise  Declines further colon cancer screening due to age PHQ score of 0       Vitamin D deficiency    Vitamin D level is therapeutic with current supplementation Disc importance of this to bone and overall health Last vitamin D Lab Results  Component Value Date   VD25OH 59.48 12/17/2022  .

## 2022-12-22 NOTE — Assessment & Plan Note (Signed)
Fairly stable  Some family worries PHQ for depression is 0  Continues zoloft 25 mg daily

## 2022-12-22 NOTE — Assessment & Plan Note (Signed)
Continues hematology care  Last hb of 11.0  Tx with retacrit and doing well

## 2022-12-22 NOTE — Assessment & Plan Note (Signed)
Disc goals for lipids and reasons to control them Rev last labs with pt Rev low sat fat diet in detail  LLD of 78 Plan to continue atorvastatin 40 mg which she is tolerating Not motivated to eat better

## 2022-12-22 NOTE — Assessment & Plan Note (Signed)
Encouraged low glycemic diet and strength building exercise as tolerated

## 2022-12-22 NOTE — Consult Note (Addendum)
Neurology Consultation Reason for Consult: Speech difficulty, global weakness Requesting Physician: Glyn Ade  CC: Headache, generalized weakness  History is obtained from: Patient, daughter at bedside, chart review  HPI: [name] [name] is a 82 y.o. female with a past medical history significant for TIA (2021, right hand weakness, slurred speech right facial droop), CAD s/p stent (2018), hypertension, hyperlipidemia, possible polymyalgia rheumatica, right femur fracture, psoriasis,  She was last seen well when she was dropped off by her daughter at her home.  At 7:15 PM when her daughter called she seemed a little off at the time.  At 7:35 PM when her son arrived she was found to be sitting in her recliner holding her head feeling nauseated and faint.  She was having difficulty with her speech, being very slow to speak and having difficulty getting words out.  On ED provider evaluation there was concern for bilateral upper extremity weakness for which code stroke was activated.  Her prior TIA episode was similar to this event except at that time she did have significant right hand weakness as well  With regards to her PMR history, patient was also unaware of this diagnosis.  In review back in 2015 her primary care physician had some concern for her PMR due to muscle aches etc. and elevated ESR to 49.  Prednisone was empirically trialed and she did have improvement of her symptoms on this medication, but ultimately discontinued it due to side effects.  Subsequently she was diagnosed with psoriatic arthritis and was started on methotrexate which again did help her arthritis however she had to discontinue this due to low blood counts.  She is currently on Retacrit injections for her anemia.  She reports one slight episode of the left temporal tenderness on Friday but otherwise no significant temporal tenderness, no new muscle aches or pains, no jaw claudication, no transient loss of vision, no new  proximal weakness not explained by her longstanding arthritis and hip replacements.   LKW: 5:50 PM Thrombolytic given?: No, too mild to treat IA performed?: No, exam not consistent with LVO Premorbid modified rankin scale:      3 - Moderate disability. Requires some help, but able to walk unassisted (with a walker, manages her own meds, incontinent at baseline)  ROS: All other review of systems was negative except as noted in the HPI.   Past Medical History:  Diagnosis Date   Anemia    of chronic disease   Anxiety    Arthritis    "psoratic arthritis; new dx" (09/11/2016)   CAD S/P percutaneous coronary angioplasty 08/2016   mLCx 90% -> 0% (2.75 mm x 16 mm) Synergy DES PCI Ostial D1 70% (not PTCA target). Moderate ostial and mD2.  LAD tandem 50% stenoses w/ negative FFR (0.86)   Cancer (HCC)    skin cancer on leg   Chronic lower back pain    Complication of anesthesia 1980 and 1982   trouble waking up after breast biopsy, many surgeries since no problem   Depression    Dyspnea on effort - CHRONIC    partial relief of Sx post PCI of L Circumflex.   Fibrocystic breast    GERD (gastroesophageal reflux disease)    Headache    "monthly" (09/11/2016)   History of blood transfusion    "several since age 89; I've had them after most of my surgeries" (09/11/2016)   History of myocardial infarction     - Myoview [name] 2018 indicated small Inferior - Inferoseptal  Infarct (vs. rest ischemia). Echo with inferoseptal hypokinesis.   Hx of skin cancer, basal cell 2013 and 2012   right inner, lower leg; several scattered around my body   Hypertension    Iron deficiency anemia    "I've had an iron infusion"   Osteoarthritis    Osteopenia    Peptic ulcer disease    H pylori   PMR (polymyalgia rheumatica) (HCC)    PONV (postoperative nausea and vomiting)    Psoriasis    TIA (transient ischemic attack) 06/03/2020   Symptoms of right arm weakness/numbness and slurred speech as well as right  droopm => MRI brain/MRA head normal.  Normal echo normal carotid Dopplers.   Past Surgical History:  Procedure Laterality Date   APPENDECTOMY     BACK SURGERY     x 6    BREAST BIOPSY Left 1980 and 1982   "benign"   CARDIAC CATHETERIZATION N/A 09/11/2016   Procedure: Left Heart Cath and Coronary Angiography;  Surgeon: Marykay Lex, MD;  Location: [facility] INVASIVE CV LAB;  Service: Cardiovascular: mLCx 90% * (PCI). Ostial D1 70% (not PTCA target). Mod ostial and mD2.  LAD tandem 50% stenoses w/ negative FFR (0.86)   CARDIAC CATHETERIZATION N/A 09/11/2016   Procedure: Intravascular Pressure Wire/FFR Study;  Surgeon: Marykay Lex, MD;  Location: [facility] INVASIVE CV LAB;  Service: Cardiovascular: --  LAD tandem 50% stenoses w/ negative FFR (0.86   CARDIAC CATHETERIZATION N/A 09/11/2016   Procedure: Coronary Stent Intervention;  Surgeon: Marykay Lex, MD;  Location: [facility] INVASIVE CV LAB;  Service: Cardiovascular: -- DES PCI mLCx 90%--0% SYNERGY DES 2.75 MM X 16 MM.   CARDIAC CATHETERIZATION  2001   non-obstructive CAD   CATARACT EXTRACTION W/ INTRAOCULAR LENS  IMPLANT, BILATERAL  July -  August 2017   DILATION AND CURETTAGE OF UTERUS  X 3   ESOPHAGOGASTRODUODENOSCOPY  03/2009   ulcer,HP, GERD stricture   EXCISIONAL HEMORRHOIDECTOMY     HIP ARTHROPLASTY Left 2010   JOINT REPLACEMENT     KNEE ARTHROSCOPY Bilateral    LAPAROSCOPIC CHOLECYSTECTOMY  2005   LUMBAR DISC SURGERY  11/1984; 1987; 1989; 2004; 2005 X 2   deg. disk lumber spine    MOHS SURGERY Right    "inside my lower leg"   NASAL SINUS SURGERY  1970s   NM MYOVIEW LTD  08/28/2016   INTERMEDIATE RISK (b/c reduced EF & ? small infarct) -- EF ~35-40%.  ? Prior Small Inferior-apical & septal infarct (w/o ischemia).  Difuse HK - worse in inferoapex.  Suggest prior infarct with no ischemia. Ischemic area correlates with echo WMA   NM MYOVIEW LTD  05/27/2021   Findings consistent with prior anteroapical infarct with fixed defect.  No  peri-infarct ischemia.  Notable improvement from prior study.:   ORIF FEMUR FRACTURE Right 01/04/2020   Procedure: OPEN REDUCTION INTERNAL FIXATION (ORIF) DISTAL FEMUR FRACTURE;  Surgeon: Samson Frederic, MD;  Location: [facility] OR;  Service: Orthopedics;  Laterality: Right;   REVERSE SHOULDER ARTHROPLASTY Left 12/17/2017   Procedure: LEFT REVERSE SHOULDER ARTHROPLASTY;  Surgeon: Beverely Low, MD;  Location: [facility] OR;  Service: Orthopedics;  Laterality: Left;   TOTAL HIP ARTHROPLASTY Right 04/04/2013   Procedure: RIGHT TOTAL HIP ARTHROPLASTY ANTERIOR APPROACH;  Surgeon: Shelda Pal, MD;  Location: [facility] ORS;  Service: Orthopedics;  Laterality: Right;   TOTAL KNEE ARTHROPLASTY Right 2009   TRANSTHORACIC ECHOCARDIOGRAM  1/'18; 1/'19   a) 1/'18: mild conc LVH. EF 45-50% - anteroseptal & inferoseptal  HK. Mod AI.; b) 1/'19 - post PCI: (post PCI) --> EF normalized:  Normal LV systolic function; mild diastolic dysfunction; mild   LVH; sclerotic aortic valve with mild AI.  No RWMA   TRANSTHORACIC ECHOCARDIOGRAM  06/04/2020    for TIA: Normal LV size and function.  No or WMA.  EF 60 to 65%.  GR 1 DD with elevated LAP.  Normal RV size and function.  Normal RVP, RAP.  Mild aortic valve calcification-sclerosis but no stenosis.  Trivial AI.  No embolic source   TUBAL LIGATION     VAGINAL HYSTERECTOMY  1970s   partial    Current Outpatient Medications  Medication Instructions   acetaminophen (TYLENOL) 1,000 mg, Oral, 2 times daily PRN   albuterol (VENTOLIN HFA) 108 (90 Base) MCG/ACT inhaler 1 puff, Inhalation, Every 6 hours PRN   aspirin EC 81 mg, Oral, Daily, Start after you complete taking Clopidogrel ( Plavix) Swallow whole.   atorvastatin (LIPITOR) 40 mg, Oral, Daily   benzonatate (TESSALON) 200 mg, Oral, 3 times daily PRN   busPIRone (BUSPAR) 15 MG tablet TAKE 1 TABLET BY MOUTH EVERY DAY   cetirizine (ZYRTEC) 10 MG tablet TAKE 1 TABLET BY MOUTH EVERY DAY   fluticasone (FLONASE) 50 MCG/ACT nasal spray 1  spray, Each Nare, Daily PRN   HYDROcodone-acetaminophen (NORCO/VICODIN) 5-325 MG tablet 1 tablet, Oral, 3 times daily PRN   losartan (COZAAR) 25 MG tablet TAKE 1 TABLET BY MOUTH EVERY DAY   Melatonin 6 mg, Oral, Daily at bedtime   methocarbamol (ROBAXIN) 500 MG tablet TAKE 1 TABLET BY MOUTH EVERY 8 HOURS AS NEEDED FOR MUSCLE SPASM   metoprolol tartrate (LOPRESSOR) 25 MG tablet TAKE 1 TABLET BY MOUTH TWICE A DAY   nitroGLYCERIN (NITROSTAT) 0.4 mg, Sublingual, Every 5 min PRN   nystatin (MYCOSTATIN/NYSTOP) powder Topical, 2 times daily PRN, To affected areas to prevent yeast (in skin folds)   pantoprazole (PROTONIX) 40 MG tablet TAKE 1 TABLET BY MOUTH EVERY DAY   sertraline (ZOLOFT) 25 mg, Oral, Daily   Vitamin D (Cholecalciferol) 2,000 Units, Oral, 2 times daily     Family History  Problem Relation Age of Onset   Arthritis Mother    Emphysema Mother    Hyperlipidemia Mother    Hypertension Mother    Heart disease Mother    Heart disease Brother        CAD   Diabetes Paternal Grandmother    Heart disease Maternal Aunt     Social History:  reports that she has never smoked. She has never used smokeless tobacco. She reports current alcohol use. She reports that she does not use drugs.   Exam: Current vital signs: BP (!) 199/77 (BP Location: Right Arm)   Pulse 67   Temp 98 F (36.7 C) (Oral)   Resp 16   SpO2 100%  Vital signs in last 24 hours: Temp:  [97.7 F (36.5 C)-98 F (36.7 C)] 98 F (36.7 C) (04/30 1610) Pulse Rate:  [67-69] 67 (04/30 2058) Resp:  [16] 16 (04/30 9604) BP: (139-199)/(62-80) 199/77 (04/30 2058) SpO2:  [96 %-100 %] 100 % (04/30 2058) Weight:  [78.5 kg] 78.5 kg (04/30 1135)   Physical Exam  Constitutional: Appears well-developed and well-nourished.  Psych: Affect appropriate to situation, pleasant and cooperative Eyes: No scleral injection HENT: No oropharyngeal obstruction.  MSK: Some arthritic changes especially of the right  hand Cardiovascular: Normal rate and regular rhythm. Perfusing extremities well Respiratory: Effort normal, non-labored breathing GI: Soft.  No  distension. There is no tenderness.  Skin: Warm dry and intact visible skin  Neuro: Mental Status: Patient is awake, alert, oriented to person, place, month, year, and situation. Patient is able to give a clear and coherent history. No signs of aphasia or neglect by the end of my evaluation, initially some slight difficulty with low-frequency words such as frames of glasses Cranial Nerves: II: Visual Fields are full. Pupils are equal, round, and reactive to light.   III,IV, VI: EOMI without ptosis or diploplia.  V: Facial sensation is symmetric to temperature VII: Facial movement is symmetric.  VIII: hearing is intact to voice X: Uvula elevates symmetrically XI: Shoulder shrug is symmetric. XII: tongue is midline without atrophy or fasciculations.  Motor: Tone is normal. Bulk is normal. 5/5 strength was present in all four extremities, other than hip flexion weakness right greater than left, pain limited.  Sensory: Sensation is symmetric to light touch throughout Deep Tendon Reflexes: 2+ and symmetric in the brachioradialis and patellae.  Cerebellar: FNF intact, heel-to-shin intact within limits of pain Gait:  Antalgic gait, able to rise from the bed without significant support, wide-based and slow but baseline per family and patient  NIHSS total 1 Score breakdown: 1 point for mild aphasia  I have reviewed labs in epic and the results pertinent to this consultation are:  Basic Metabolic Panel: Recent Labs  Lab 12/17/22 0828 12/22/22 2200  NA 141 135  K 4.4 4.2  CL 105 103  CO2 28  --   GLUCOSE 89 118*  BUN 22 21  CREATININE 0.99 0.90  CALCIUM 9.1  --     CBC: Recent Labs  Lab 12/17/22 0828 12/22/22 2155 12/22/22 2200  WBC 7.3 10.0  --   NEUTROABS 5.5 7.3  --   HGB 11.0* 10.7* 10.9*  HCT 33.6* 33.4* 32.0*  MCV 89.7  91.8  --   PLT 209.0 217  --     Coagulation Studies: Recent Labs    12/22/22 2154-01-28  LABPROT 14.0  INR 1.1    Lab Results  Component Value Date   CHOL 153 12/17/2022   HDL 46.90 12/17/2022   LDLCALC 78 12/17/2022   LDLDIRECT 75.0 12/02/2021   TRIG 144.0 12/17/2022   CHOLHDL 3 12/17/2022   Lab Results  Component Value Date   HGBA1C 5.7 12/17/2022     Lab Results  Component Value Date   ESRSEDRATE 31 (H) 12/10/2015   Lab Results  Component Value Date   CRP 1.5 (H) 08/28/2015     I have reviewed the images obtained: Head CT personally reviewed, agree with radiology no acute intracranial process   Impression: While most of the symptoms patient complains about more generalized, her speech difficulties are concerning for possible TIA, and she still has very mild word finding difficulties for example difficulty stating "bone marrow" and naming "methotrexate" which she reports is very unusual for her to be unable to think of these words.  Her blood pressure also remains elevated.  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 - Stroke labs ESR, CRP, HgbA1c, fasting lipid panel - MRI brain  - CTA head and neck - Frequent neuro checks - Echocardiogram - Continue home aspirin 81 mg daily - Plavix 300 milligrams loading dose followed by 75 mg daily, course to be determined pending vessel imaging - Risk factor modification - Telemetry monitoring - Blood pressure goal   -  Permissive hypertension to 220/120 for 24 to 48 hours if there is an acute stroke, if MRI brain is negative start to gradually normalize blood pressure  - PT consult, OT consult, Speech consult Not indicated as patient is essentially at baseline - Stroke team to follow  Brooke Dare MD-PhD [facility] [phone] Available 7 PM to 7 AM, outside of these hours please call Neurologist on call as listed  on Amion.  CRITICAL CARE Performed by: Gordy Councilman   Total critical care time: 40 minutes  Critical care time was exclusive of separately billable procedures and treating other patients.  Critical care was necessary to treat or prevent imminent or life-threatening deterioration.  Critical care was time spent personally by me on the following activities: development of treatment plan with patient and/or surrogate as well as nursing, discussions with consultants, evaluation of patient's response to treatment, examination of patient, obtaining history from patient or surrogate, ordering and performing treatments and interventions, ordering and review of laboratory studies, ordering and review of radiographic studies, pulse oximetry and re-evaluation of patient's condition.

## 2022-12-22 NOTE — ED Provider Notes (Signed)
[place] EMERGENCY DEPARTMENT AT [facility] Provider Note   CSN: [id number] Arrival date & time: 12/22/22  2049  An emergency department physician performed an initial assessment on this suspected stroke patient at 2148.  History Chief Complaint  Patient presents with   Gen. Weakness / Dizzy    HPI [name] [name] is a 82 y.o. female presenting for chief complaint of altered mental status. She is a 82 year old female with an extensive medical history.  At 7 PM she was noted to be confused by her family having word finding difficulty and upper extremity weakness. At 6 PM she was dropped off from her family ember asymptomatic.  They deny fevers chills nausea vomiting syncope or shortness of breath.  Patient is still having difficulty getting through sentences per the family.  The family states that her baseline is complete unassisted communication and that she is currently having word finding difficulties.  Last known well 6 PM.  Patient's recorded medical, surgical, social, medication list and allergies were reviewed in the Snapshot window as part of the initial history.   Review of Systems   Review of Systems  Constitutional:  Negative for chills and fever.  HENT:  Negative for ear pain and sore throat.   Eyes:  Negative for pain and visual disturbance.  Respiratory:  Negative for cough and shortness of breath.   Cardiovascular:  Negative for chest pain and palpitations.  Gastrointestinal:  Negative for abdominal pain and vomiting.  Genitourinary:  Negative for dysuria and hematuria.  Musculoskeletal:  Negative for arthralgias and back pain.  Skin:  Negative for color change and rash.  Neurological:  Positive for speech difficulty and weakness. Negative for seizures and syncope.  Psychiatric/Behavioral:  Positive for confusion.   All other systems reviewed and are negative.   Physical Exam Updated Vital Signs BP (!) 199/77 (BP Location: Right Arm)   Pulse 67    Temp 98 F (36.7 C) (Oral)   Resp 16   SpO2 100%  Physical Exam Vitals and nursing note reviewed.  Constitutional:      General: She is not in acute distress.    Appearance: She is well-developed.  HENT:     Head: Normocephalic and atraumatic.  Eyes:     Conjunctiva/sclera: Conjunctivae normal.  Cardiovascular:     Rate and Rhythm: Normal rate and regular rhythm.     Heart sounds: No murmur heard. Pulmonary:     Effort: Pulmonary effort is normal. No respiratory distress.     Breath sounds: Normal breath sounds.  Abdominal:     General: There is no distension.     Palpations: Abdomen is soft.     Tenderness: There is no abdominal tenderness. There is no right CVA tenderness or left CVA tenderness.  Musculoskeletal:        General: No swelling or tenderness. Normal range of motion.     Cervical back: Neck supple.  Skin:    General: Skin is warm and dry.  Neurological:     General: No focal deficit present.     Mental Status: She is alert and oriented to person, place, and time. Mental status is at baseline.     Cranial Nerves: No cranial nerve deficit.     Motor: Weakness present.     Comments: Bilateral upper extremity 4 out of 5 strength Delayed word finding difficulties.  Delay with left gaze.      ED Course/ Medical Decision Making/ A&P    Procedures .Critical Care  Performed by: Glyn Ade, MD Authorized by: Glyn Ade, MD   Critical care provider statement:    Critical care time (minutes):  30   Critical care was necessary to treat or prevent imminent or life-threatening deterioration of the following conditions:  CNS failure or compromise (Code stroke with consideration for [facility])   Critical care was time spent personally by me on the following activities:  Development of treatment plan with patient or surrogate, discussions with consultants, evaluation of patient's response to treatment, examination of patient, ordering and review of laboratory  studies, ordering and review of radiographic studies, ordering and performing treatments and interventions, pulse oximetry, re-evaluation of patient's condition and review of old charts   Care discussed with: admitting provider      Medications Ordered in ED Medications   stroke: early stages of recovery book (has no administration in time range)  clopidogrel (PLAVIX) tablet 300 mg (has no administration in time range)    Followed by  clopidogrel (PLAVIX) tablet 75 mg (has no administration in time range)  hydrALAZINE (APRESOLINE) tablet 25 mg (has no administration in time range)   Medical Decision Making:    [name] [name] is a 82 y.o. female who presented to the ED today with upper extremity weakness and aphasia.  Due to these symptoms, nursing activated a CODE STROKE per hospital protocol.   On my initial exam, the pt was in no acute distress, glucose was WNL and deficits speech changes and BL UE weakness.  Deficits are persistent.   Reviewed and confirmed nursing documentation for past medical history, family history, social history.      Initial Assessment and Plan:   Patient immediately evaluated jointly by neurology and emergency department providers.   This is most consistent with an acute life/limb threatening illness complicated by underlying chronic conditions. Objective findings concerning for possible CVA.  Consulted neurology, differential is broad includes infectious etiology such as UTI, pneumonia or metabolic abnormality such as electrolyte abnormality Initial Study Results: Labs Labs reviewed without evidence of clinically relevant abnormality.  EKG EKG was reviewed independently. Rate, rhythm, axis, intervals all examined and without medically relevant abnormality. ST segments without concerns for elevations.    Radiology  Images reviewed independently, agree with radiology report at this time.   CT HEAD CODE STROKE WO CONTRAST  Final Result    DG Chest  Portable 1 View  Final Result    CT ANGIO HEAD NECK W WO CM    (Results Pending)  MR BRAIN WO CONTRAST    (Results Pending)     Final Assessment and Plan:   Received call back from neurology.  Given atypical presentation word finding difficulties, they have recommended admission.  Will treat patient with antihypertensives and oncoming provider to arrange for admission when stable.   Clinical Impression:  1. Weakness      Data Unavailable   Final Clinical Impression(s) / ED Diagnoses Final diagnoses:  Weakness    Rx / DC Orders ED Discharge Orders     None         Glyn Ade, MD 12/22/22 2334

## 2022-12-22 NOTE — Assessment & Plan Note (Signed)
GFR 52.8  Encouraged fluid intake /nsaid avoidance

## 2022-12-22 NOTE — Assessment & Plan Note (Signed)
Lab Results  Component Value Date   VITAMINB12 365 12/17/2022   Last vitamin D Lab Results  Component Value Date   VD25OH 59.48 12/17/2022

## 2022-12-22 NOTE — Assessment & Plan Note (Signed)
No longer elevated Lab Results  Component Value Date   TSH 2.29 12/17/2022    Nl FT4

## 2022-12-22 NOTE — Assessment & Plan Note (Signed)
Lab Results  Component Value Date   HGBA1C 5.7 12/17/2022   Encouraged low glycemic diet

## 2022-12-22 NOTE — Assessment & Plan Note (Addendum)
Vitamin D level is therapeutic with current supplementation Disc importance of this to bone and overall health Last vitamin D Lab Results  Component Value Date   VD25OH 59.48 12/17/2022   .

## 2022-12-22 NOTE — Assessment & Plan Note (Signed)
Reviewed health habits including diet and exercise and skin cancer prevention Reviewed appropriate screening tests for age  Also reviewed health mt list, fam hx and immunization status , as well as social and family history   See HPI Labs reviewed and ordered Amw utd Interested in shingrix and tetnus update at pharm-she will schedule Mammogram utd 10/2022 Dexa utd 10/2022    discussed fall prevention and exercise  Declines further colon cancer screening due to age PHQ score of 0

## 2022-12-22 NOTE — Assessment & Plan Note (Signed)
Dexa reviewed 10/2022 One fall No new fractures (leg fx in the past)  D level is therapeutic Limited exercise /encouraged to do some chair strength exercise if tolerated

## 2022-12-22 NOTE — Patient Instructions (Addendum)
If you are interested in the new shingles vaccine (Shingrix) - call your local pharmacy to check on coverage and availability  If affordable, get on a wait list at your pharmacy to get the vaccine.  If you want to update your tetanus shot -check at drugstore also   Add some strength training to your routine, this is important for bone and brain health and can reduce your risk of falls and help your body use insulin properly and regulate weight  Light weights, exercise bands , and internet videos are a good way to start  Yoga (chair or regular), machines , floor exercises or a gym with machines are also good options   Keep thinking about fall prevention

## 2022-12-23 ENCOUNTER — Observation Stay (HOSPITAL_BASED_OUTPATIENT_CLINIC_OR_DEPARTMENT_OTHER): Payer: Medicare Other

## 2022-12-23 ENCOUNTER — Emergency Department (HOSPITAL_COMMUNITY): Payer: Medicare Other

## 2022-12-23 ENCOUNTER — Encounter (HOSPITAL_COMMUNITY): Payer: Self-pay | Admitting: Internal Medicine

## 2022-12-23 DIAGNOSIS — G459 Transient cerebral ischemic attack, unspecified: Secondary | ICD-10-CM

## 2022-12-23 DIAGNOSIS — R29818 Other symptoms and signs involving the nervous system: Secondary | ICD-10-CM | POA: Diagnosis not present

## 2022-12-23 DIAGNOSIS — R8271 Bacteriuria: Secondary | ICD-10-CM | POA: Insufficient documentation

## 2022-12-23 DIAGNOSIS — Z8673 Personal history of transient ischemic attack (TIA), and cerebral infarction without residual deficits: Secondary | ICD-10-CM | POA: Diagnosis present

## 2022-12-23 DIAGNOSIS — I2510 Atherosclerotic heart disease of native coronary artery without angina pectoris: Secondary | ICD-10-CM | POA: Diagnosis not present

## 2022-12-23 DIAGNOSIS — I10 Essential (primary) hypertension: Secondary | ICD-10-CM | POA: Diagnosis not present

## 2022-12-23 DIAGNOSIS — Z9861 Coronary angioplasty status: Secondary | ICD-10-CM | POA: Diagnosis not present

## 2022-12-23 DIAGNOSIS — I674 Hypertensive encephalopathy: Secondary | ICD-10-CM

## 2022-12-23 DIAGNOSIS — T50905A Adverse effect of unspecified drugs, medicaments and biological substances, initial encounter: Secondary | ICD-10-CM | POA: Diagnosis not present

## 2022-12-23 DIAGNOSIS — E785 Hyperlipidemia, unspecified: Secondary | ICD-10-CM | POA: Diagnosis not present

## 2022-12-23 DIAGNOSIS — E669 Obesity, unspecified: Secondary | ICD-10-CM

## 2022-12-23 DIAGNOSIS — I6523 Occlusion and stenosis of bilateral carotid arteries: Secondary | ICD-10-CM | POA: Diagnosis not present

## 2022-12-23 LAB — TROPONIN I (HIGH SENSITIVITY): Troponin I (High Sensitivity): 7 ng/L (ref <18)

## 2022-12-23 LAB — COMPREHENSIVE METABOLIC PANEL
ALT: 14 U/L (ref 0–44)
AST: 20 U/L (ref 15–41)
Albumin: 3.3 g/dL — ABNORMAL LOW (ref 3.5–5.0)
Alkaline Phosphatase: 51 U/L (ref 38–126)
Anion gap: 10 (ref 5–15)
BUN: 15 mg/dL (ref 8–23)
CO2: 22 mmol/L (ref 22–32)
Calcium: 8.7 mg/dL — ABNORMAL LOW (ref 8.9–10.3)
Chloride: 104 mmol/L (ref 98–111)
Creatinine, Ser: 0.94 mg/dL (ref 0.44–1.00)
GFR, Estimated: >60 mL/min (ref >60)
Glucose, Bld: 114 mg/dL — ABNORMAL HIGH (ref 70–99)
Potassium: 3.8 mmol/L (ref 3.5–5.1)
Sodium: 136 mmol/L (ref 135–145)
Total Bilirubin: 0.6 mg/dL (ref 0.3–1.2)
Total Protein: 6.2 g/dL — ABNORMAL LOW (ref 6.5–8.1)

## 2022-12-23 LAB — CBC WITH DIFFERENTIAL/PLATELET
Abs Immature Granulocytes: 0.05 10*3/uL (ref 0.00–0.07)
Basophils Absolute: 0.1 10*3/uL (ref 0.0–0.1)
Basophils Relative: 0 %
Eosinophils Absolute: 0.1 10*3/uL (ref 0.0–0.5)
Eosinophils Relative: 1 %
HCT: 33.4 % — ABNORMAL LOW (ref 36.0–46.0)
Hemoglobin: 10.9 g/dL — ABNORMAL LOW (ref 12.0–15.0)
Immature Granulocytes: 0 %
Lymphocytes Relative: 19 %
Lymphs Abs: 2.2 10*3/uL (ref 0.7–4.0)
MCH: 29.4 pg (ref 26.0–34.0)
MCHC: 32.6 g/dL (ref 30.0–36.0)
MCV: 90 fL (ref 80.0–100.0)
Monocytes Absolute: 0.7 10*3/uL (ref 0.1–1.0)
Monocytes Relative: 6 %
Neutro Abs: 8.7 10*3/uL — ABNORMAL HIGH (ref 1.7–7.7)
Neutrophils Relative %: 74 %
Platelets: 219 10*3/uL (ref 150–400)
RBC: 3.71 MIL/uL — ABNORMAL LOW (ref 3.87–5.11)
RDW: 12.7 % (ref 11.5–15.5)
WBC: 11.8 10*3/uL — ABNORMAL HIGH (ref 4.0–10.5)
nRBC: 0 % (ref 0.0–0.2)

## 2022-12-23 LAB — URINALYSIS, W/ REFLEX TO CULTURE (INFECTION SUSPECTED)
Bilirubin Urine: NEGATIVE
Glucose, UA: NEGATIVE mg/dL
Hgb urine dipstick: NEGATIVE
Ketones, ur: NEGATIVE mg/dL
Nitrite: POSITIVE — AB
Protein, ur: NEGATIVE mg/dL
Specific Gravity, Urine: 1.025 (ref 1.005–1.030)
pH: 5 (ref 5.0–8.0)

## 2022-12-23 LAB — URINALYSIS, ROUTINE W REFLEX MICROSCOPIC
Bilirubin Urine: NEGATIVE
Glucose, UA: NEGATIVE mg/dL
Hgb urine dipstick: NEGATIVE
Ketones, ur: NEGATIVE mg/dL
Nitrite: POSITIVE — AB
Protein, ur: NEGATIVE mg/dL
Specific Gravity, Urine: 1.009 (ref 1.005–1.030)
pH: 6 (ref 5.0–8.0)

## 2022-12-23 LAB — LIPID PANEL
Cholesterol: 149 mg/dL (ref 0–200)
HDL: 42 mg/dL (ref >40)
LDL Cholesterol: 76 mg/dL (ref 0–99)
Total CHOL/HDL Ratio: 3.5 RATIO
Triglycerides: 154 mg/dL — ABNORMAL HIGH (ref <150)
VLDL: 31 mg/dL (ref 0–40)

## 2022-12-23 LAB — ECHOCARDIOGRAM COMPLETE
AR max vel: 1.67 cm2
AV Area VTI: 1.54 cm2
AV Area mean vel: 1.61 cm2
AV Mean grad: 5 mmHg
AV Peak grad: 10 mmHg
Ao pk vel: 1.58 m/s
Area-P 1/2: 3.86 cm2
Height: 62.75 in
S' Lateral: 2.6 cm
Weight: 2768 oz

## 2022-12-23 LAB — MAGNESIUM: Magnesium: 1.8 mg/dL (ref 1.7–2.4)

## 2022-12-23 LAB — RAPID URINE DRUG SCREEN, HOSP PERFORMED
Amphetamines: NOT DETECTED
Barbiturates: NOT DETECTED
Benzodiazepines: NOT DETECTED
Cocaine: NOT DETECTED
Opiates: POSITIVE — AB
Tetrahydrocannabinol: NOT DETECTED

## 2022-12-23 LAB — PHOSPHORUS: Phosphorus: 3.7 mg/dL (ref 2.5–4.6)

## 2022-12-23 LAB — SEDIMENTATION RATE: Sed Rate: 15 mm/hr (ref 0–22)

## 2022-12-23 MED ORDER — SERTRALINE HCL 50 MG PO TABS
25.0000 mg | ORAL_TABLET | Freq: Every day | ORAL | Status: DC
  Administered 2022-12-23 – 2022-12-24 (×2): 25 mg via ORAL
  Filled 2022-12-23 (×3): qty 1

## 2022-12-23 MED ORDER — BUSPIRONE HCL 10 MG PO TABS
15.0000 mg | ORAL_TABLET | Freq: Every day | ORAL | Status: DC
  Administered 2022-12-23 – 2022-12-24 (×2): 15 mg via ORAL
  Filled 2022-12-23 (×3): qty 2

## 2022-12-23 MED ORDER — ONDANSETRON HCL 4 MG/2ML IJ SOLN: 4.0000 mg | Freq: Four times a day (QID) | INTRAMUSCULAR | Status: DC | PRN

## 2022-12-23 MED ORDER — SODIUM CHLORIDE 0.9 % IV SOLN
1.0000 g | INTRAVENOUS | Status: DC
  Administered 2022-12-23: 1 g via INTRAVENOUS
  Filled 2022-12-23: qty 10

## 2022-12-23 MED ORDER — METOPROLOL TARTRATE 25 MG PO TABS
25.0000 mg | ORAL_TABLET | Freq: Two times a day (BID) | ORAL | Status: DC
  Administered 2022-12-23: 25 mg via ORAL
  Filled 2022-12-23 (×2): qty 1

## 2022-12-23 MED ORDER — ACETAMINOPHEN 325 MG PO TABS: 650.0000 mg | ORAL_TABLET | ORAL | Status: DC | PRN

## 2022-12-23 MED ORDER — MELATONIN 3 MG PO TABS
3.0000 mg | ORAL_TABLET | Freq: Every evening | ORAL | Status: DC | PRN
  Administered 2022-12-23: 3 mg via ORAL
  Filled 2022-12-23: qty 1

## 2022-12-23 MED ORDER — ASPIRIN 81 MG PO TBEC
81.0000 mg | DELAYED_RELEASE_TABLET | Freq: Every day | ORAL | Status: DC
  Administered 2022-12-23 – 2022-12-24 (×2): 81 mg via ORAL
  Filled 2022-12-23 (×3): qty 1

## 2022-12-23 MED ORDER — ATORVASTATIN CALCIUM 40 MG PO TABS
40.0000 mg | ORAL_TABLET | Freq: Every day | ORAL | Status: DC
  Administered 2022-12-23 – 2022-12-24 (×2): 40 mg via ORAL
  Filled 2022-12-23 (×3): qty 1

## 2022-12-23 MED ORDER — STROKE: EARLY STAGES OF RECOVERY BOOK
Freq: Once | Status: AC
  Filled 2022-12-23: qty 1

## 2022-12-23 MED ORDER — METHOCARBAMOL 500 MG PO TABS
500.0000 mg | ORAL_TABLET | Freq: Three times a day (TID) | ORAL | Status: DC | PRN
  Administered 2022-12-23 – 2022-12-24 (×2): 500 mg via ORAL
  Filled 2022-12-23 (×2): qty 1

## 2022-12-23 MED ORDER — ACETAMINOPHEN 160 MG/5ML PO SOLN: 650.0000 mg | ORAL | Status: DC | PRN

## 2022-12-23 MED ORDER — ACETAMINOPHEN 650 MG RE SUPP: 650.0000 mg | RECTAL | Status: DC | PRN

## 2022-12-23 MED ORDER — MAGNESIUM SULFATE 2 GM/50ML IV SOLN
2.0000 g | Freq: Once | INTRAVENOUS | Status: AC
  Administered 2022-12-23: 2 g via INTRAVENOUS
  Filled 2022-12-23: qty 50

## 2022-12-23 MED ORDER — LOSARTAN POTASSIUM 50 MG PO TABS
50.0000 mg | ORAL_TABLET | Freq: Every day | ORAL | Status: DC
  Administered 2022-12-24: 50 mg via ORAL
  Filled 2022-12-23: qty 1

## 2022-12-23 MED ORDER — LOSARTAN POTASSIUM 25 MG PO TABS
25.0000 mg | ORAL_TABLET | Freq: Every day | ORAL | Status: DC
  Administered 2022-12-23: 25 mg via ORAL
  Filled 2022-12-23 (×2): qty 1

## 2022-12-23 MED ORDER — METOPROLOL TARTRATE 25 MG PO TABS
37.5000 mg | ORAL_TABLET | Freq: Two times a day (BID) | ORAL | Status: DC
  Administered 2022-12-23 – 2022-12-24 (×2): 37.5 mg via ORAL
  Filled 2022-12-23 (×2): qty 1

## 2022-12-23 MED ORDER — HYDRALAZINE HCL 20 MG/ML IJ SOLN
10.0000 mg | Freq: Four times a day (QID) | INTRAMUSCULAR | Status: DC | PRN
  Filled 2022-12-23: qty 1

## 2022-12-23 MED ORDER — HEPARIN SODIUM (PORCINE) 5000 UNIT/ML IJ SOLN
5000.0000 [IU] | Freq: Three times a day (TID) | INTRAMUSCULAR | Status: DC
  Administered 2022-12-23 – 2022-12-24 (×4): 5000 [IU] via SUBCUTANEOUS
  Filled 2022-12-23 (×4): qty 1

## 2022-12-23 MED ORDER — HYDROCODONE-ACETAMINOPHEN 5-325 MG PO TABS
1.0000 | ORAL_TABLET | Freq: Three times a day (TID) | ORAL | Status: DC | PRN
  Administered 2022-12-23 – 2022-12-24 (×3): 1 via ORAL
  Filled 2022-12-23 (×3): qty 1

## 2022-12-23 MED ORDER — IOHEXOL 350 MG/ML SOLN
75.0000 mL | Freq: Once | INTRAVENOUS | Status: AC | PRN
  Administered 2022-12-23: 75 mL via INTRAVENOUS

## 2022-12-23 NOTE — TOC Transition Note (Signed)
Transition of Care [facility]) - CM/SW Discharge Note   Patient Details  Name: [name] [name] MRN: [id number] Date of Birth: April 22, 1941  Transition of Care [facility]) CM/SW Contact:  Kermit Balo, RN Phone Number: 12/23/2022, 3:21 PM   Clinical Narrative:    Pt is from home with her son and grandson. They are in and out so does have times when she is home alone. Pt was driving self and managing her own medications. Family can assist with transportation if needed.  [facility] [address] DME at home: walker/ cane/ tub seat Recommendations for home health. Pt has used [facility] in the past and would like to use them again. [facility] with [facility] accepted and information on the AVS. Pt has transportation via family when medically ready for d/c home.   Final next level of care: Home w Home Health Services Barriers to Discharge: No Barriers Identified   Patient Goals and CMS Choice CMS Medicare.gov Compare Post Acute Care list provided to:: Patient Choice offered to / list presented to : Patient  Discharge Placement                         Discharge Plan and Services Additional resources added to the After Visit Summary for                            [facility] Arranged: PT, OT [facility] Agency: [facility] Date [facility] Agency Contacted: 12/23/22   Representative spoke with at [facility] Agency: Kandee Keen  Social Determinants of Health (SDOH) Interventions SDOH Screenings   Food Insecurity: No Food Insecurity (12/23/2022)  Housing: Low Risk  (12/23/2022)  Transportation Needs: No Transportation Needs (12/23/2022)  Utilities: Not At Risk (12/23/2022)  Alcohol Screen: Low Risk  (12/09/2022)  Depression (PHQ2-9): Low Risk  (12/09/2022)  Financial Resource Strain: Low Risk  (12/09/2022)  Physical Activity: Inactive (12/09/2022)  Social Connections: Moderately Isolated (12/09/2022)  Stress: No Stress Concern Present (12/09/2022)  Tobacco Use: Low Risk  (12/23/2022)     Readmission Risk Interventions      No data to display

## 2022-12-23 NOTE — ED Notes (Signed)
Pt transferred to inpatient unit at this time via stretcher with all belongings.

## 2022-12-23 NOTE — Assessment & Plan Note (Signed)
Continue lipitor 40 mg 

## 2022-12-23 NOTE — Progress Notes (Signed)
  Echocardiogram 2D Echocardiogram has been performed.  Nyaisha Simao Wynn Banker 12/23/2022, 11:04 AM

## 2022-12-23 NOTE — Evaluation (Signed)
Occupational Therapy Evaluation Patient Details Name: [name] [name] MRN: [id number] DOB: 1941-06-10 Today's Date: 12/23/2022   History of Present Illness Pt is an 82 y.o. female who presented 12/22/22 with sudden weakness and speech difficulty. MRI negative for acute intracranial abnormality. Being worked up for TIA. PMH: CAD s/p stent, HLD, HTN, obesity, anemia, arthritis, cancer, osteopenia, psoriasis, TIA   Clinical Impression   Lavora was evaluated s/p the above admission list. She is generally indep at baseline. Upon evaluation she was limited by generalized weakness and decreased activity tolerance due to lack of sleep. Overall she needed min G - supervision A for mobility and ADLs, min A given for socks but pt reports she does not wear socks at baseline. Reviewed BE FAST with good recall from pt and family. Pt will benefit from continued acute OT services to facilitate d/c home with support of family.       Recommendations for follow up therapy are one component of a multi-disciplinary discharge planning process, led by the attending physician.  Recommendations may be updated based on patient status, additional functional criteria and insurance authorization.   Assistance Recommended at Discharge Set up Supervision/Assistance  Patient can return home with the following A little help with walking and/or transfers;A little help with bathing/dressing/bathroom;Assistance with cooking/housework;Assist for transportation;Help with stairs or ramp for entrance    Functional Status Assessment  Patient has had a recent decline in their functional status and demonstrates the ability to make significant improvements in function in a reasonable and predictable amount of time.  Equipment Recommendations  None recommended by OT       Precautions / Restrictions Precautions Precautions: Fall;Other (comment) Precaution Comments: HTN Restrictions Weight Bearing Restrictions: No      Mobility Bed  Mobility Overal bed mobility: Needs Assistance Bed Mobility: Supine to Sit, Sit to Supine     Supine to sit: Supervision Sit to supine: Supervision        Transfers Overall transfer level: Needs assistance Equipment used: None Transfers: Sit to/from Stand Sit to Stand: Min guard                  Balance Overall balance assessment: Needs assistance Sitting-balance support: Feet supported Sitting balance-Leahy Scale: Good     Standing balance support: During functional activity, No upper extremity supported Standing balance-Leahy Scale: Fair                             ADL either performed or assessed with clinical judgement   ADL Overall ADL's : Needs assistance/impaired Eating/Feeding: Independent   Grooming: Supervision/safety;Standing   Upper Body Bathing: Set up;Sitting   Lower Body Bathing: Min guard;Sit to/from stand   Upper Body Dressing : Set up;Sitting   Lower Body Dressing: Minimal assistance;Sit to/from stand Lower Body Dressing Details (indicate cue type and reason): min A for socks Toilet Transfer: Supervision/safety;Ambulation   Toileting- Clothing Manipulation and Hygiene: Supervision/safety;Sitting/lateral lean       Functional mobility during ADLs: Supervision/safety General ADL Comments: likely at her functional baseline     Vision Baseline Vision/History: 1 Wears glasses Vision Assessment?: No apparent visual deficits     Perception Perception Perception Tested?: No   Praxis Praxis Praxis tested?: Not tested    Pertinent Vitals/Pain Pain Assessment Pain Assessment: Faces Faces Pain Scale: Hurts little more Pain Location: head (heaviness with positional changes) Pain Descriptors / Indicators: Heaviness Pain Intervention(s): Limited activity within patient's tolerance, Monitored during session  Hand Dominance Right   Extremity/Trunk Assessment Upper Extremity Assessment Upper Extremity Assessment: RUE  deficits/detail;LUE deficits/detail RUE Deficits / Details: limited shoulder AROM due to pain, otherwise WFL LUE Deficits / Details: hx of reverse TSA. Good ROM and functional strength   Lower Extremity Assessment Lower Extremity Assessment: Defer to PT evaluation   Cervical / Trunk Assessment Cervical / Trunk Assessment: Normal   Communication Communication Communication: No difficulties   Cognition Arousal/Alertness: Awake/alert Behavior During Therapy: WFL for tasks assessed/performed Overall Cognitive Status: Within Functional Limits for tasks assessed                                 General Comments: slow processing and difficulty word finding noted at times. Pt's daughter present and reports baseline     General Comments  VSS, BP stable            Home Living Family/patient expects to be discharged to:: Private residence Living Arrangements: Children;Other relatives Available Help at Discharge: Family;Available 24 hours/day Type of Home: House Home Access: Stairs to enter [employer] of Steps: 3 Entrance Stairs-Rails: Right;Left Home Layout: One level     Bathroom Shower/Tub: Chief Strategy Officer: Handicapped height     Home Equipment: Agricultural consultant (2 wheels);Rollator (4 wheels);Cane - single point;Tub bench   Additional Comments: sleeps in recliner      Prior Functioning/Environment Prior Level of Function : Needs assist             Mobility Comments: Mod I using RW vs rollator vs SPC for mobility ADLs Comments: Wears slip on shoes and does not wear socks at baseline due to difficulty managing and dislike of them        OT Problem List: Decreased range of motion;Decreased activity tolerance;Impaired balance (sitting and/or standing)      OT Treatment/Interventions: Self-care/ADL training;Therapeutic exercise;DME and/or AE instruction;Therapeutic activities;Patient/family education;Balance training    OT  Goals(Current goals can be found in the care plan section) Acute Rehab OT Goals Patient Stated Goal: to go home OT Goal Formulation: With patient Time For Goal Achievement: 01/06/23 Potential to Achieve Goals: Good ADL Goals Additional ADL Goal #1: Pt will complete BADLs with mod I  OT Frequency: Min 2X/week       AM-PAC OT "6 Clicks" Daily Activity     Outcome Measure Help from another person eating meals?: None Help from another person taking care of personal grooming?: A Little Help from another person toileting, which includes using toliet, bedpan, or urinal?: A Little Help from another person bathing (including washing, rinsing, drying)?: A Little Help from another person to put on and taking off regular upper body clothing?: None Help from another person to put on and taking off regular lower body clothing?: A Little 6 Click Score: 20   End of Session Nurse Communication: Mobility status  Activity Tolerance: Patient tolerated treatment well Patient left: with call bell/phone within reach;in bed;with family/visitor present  OT Visit Diagnosis: Unsteadiness on feet (R26.81);Other abnormalities of gait and mobility (R26.89);Muscle weakness (generalized) (M62.81)                Time: 1610-9604 OT Time Calculation (min): 20 min Charges:  OT General Charges $OT Visit: 1 Visit OT Evaluation $OT Eval Moderate Complexity: 1 Mod  Derenda Mis, OTR/L Acute Rehabilitation Services Office [phone] Secure Chat Communication Preferred   Donia Pounds 12/23/2022, 11:02 AM

## 2022-12-23 NOTE — Subjective & Objective (Signed)
CC: sudden weakness HPI: 82 year old female history of coronary disease status post stent, hyperlipidemia, hypertension, obesity, chronic pain on hydrocodone, presents to the ER today with sudden weakness around 7:30 PM.  Patient had a primary care via office visit yesterday.  She went out to lunch with her daughter and another relative.  They drove to [place] to go gambling.  They got home around 6:00 PM. around 7:30 PM, patient developed sudden acute generalized weakness.  No slurred speech.  No focality.  Patient brought in to the ER for evaluation.  On arrival temp 98 heart rate 67 blood pressure 199/77.  White count 10.0, hemoglobin 10.7, platelets of 217  First set of troponin is negative.  CT head negative for acute intracranial abnormality.  CT angio negative for large vessel occlusion.  MRI brain has been ordered.  Neurology has seen the patient.  [facility] hospitalist contacted for admission.

## 2022-12-23 NOTE — Progress Notes (Signed)
Care I did prior to midnight in the emergency room and [name] [name] is admitted under this morning after midnight by Dr. Carollee Herter and I am in current agreement with his Assessment and Plan. Additional changes to the plan of care have been made accordingly.  The [name] [name] is an elderly Caucasian female with a past medical history significant for but limited to CAD status post stenting, hyperlipoidemia, hypertension, obesity, chronic pain syndrome on hydrocodone as well as other comorbidities presented to the ED with sudden weakness around 7:30 PM last night.  She had a primary care visit yesterday and had a cardiology follow-up on Friday.  She went out to lunch with her daughter and another relative and then the daughter proceeded about a minute.  They got home around 6 PM and around 7:30 PM the [name] [name] developed sudden acute generalized weakness with no slurred speech no focality and she was brought to the ED for further evaluation.  [name] [name]'s daughter noted that [name] [name] seemed "off" and she is found to be in a recliner feeling nauseated and faint and per report she had difficulty with her speech and began to speak very slowly and having difficulty getting her words out.  She came to the ED and a code stroke was activated and neurology was consulted.  She is currently undergoing stroke and TIA workup and neurology is following.  MRI of the brain has been done and showed no acute intracranial abnormalities but did show findings of chronic small vessel ischemia.  CT of the head done and showed "No emergent large vessel occlusion or hemodynamically significant stenosis of the head or neck. Mild bilateral carotid bifurcation atherosclerosis without hemodynamically significant stenosis"  Currently undergoing workup and echocardiogram also done and showed an EF of 60 to 65%.  Neurology has been consulted now recommending dual antiplatelet therapy for at least 3 weeks with just Plavix alone afterwards however formal note is  still pending.  She has been loaded with Plavix already.  PT OT recommending home health and [name] [name] feels weak.  Urinalysis was positive for small leukocytes and positive for nitrites with many bacteria.  Initially thought to have asymptomatic bacteriuria however cannot effectively rule out UTI given her symptoms so we will initiate IV ceftriaxone continue antibiotics for 3 days total.  She will further care per neurology and PT OT recommending home health.  Currently she is being admitted and treated for the following but not limited to:  Assessment and Plan:  * TIA (transient ischemic attack) -Observation med/tele bed.  -MRI brain completed and showed no acute intracranial abnormalities but did show chronic small vessel ischemic changes.  -Permissive HTN for now per neuro consult note. Keep BP <220/110.  -Check echo. Check lipid panel.  -Started on plavix. Continue asa. -Further care per Neurology; Currently formal note is not in yet  CAD S/P percutaneous coronary angioplasty -Stable. On lipitor and asa, lopressor 25 mg bid. Now adding Plavix per Neuro  Essential Hypertension -Allow for permissive htn per neuro consult.  -At home, pt on losartan 25 mg qd and lopressor 25 mg bid. Recently saw cardiology this past week.  -If BP remains elevated, increase losartan to 50 mg daily.  Hyperlipidemia with target low density lipoprotein (LDL) cholesterol less than 70 mg/dL -Continue  lipitor 40 mg  Asymptomatic bacteriuria vs UTI -[name] [name] denied any dysuria.   -[name] [name] chronically has urinary frequency due to urinary incontinence.  -She was confused yesterday however -UA with + Small Leukocytes, Positive Nitrites and Many Bacteria -Urine  Cx not done so will order -Trial of IV Ceftriaxone for 3 Days -WBC trending up: Recent Labs  Lab 12/17/22 0828 12/22/22 2155 12/23/22 0436  WBC 7.3 10.0 11.8*   Normocytic Anemia -Hgb/Hct Trend: Recent Labs  Lab 12/17/22 0828 12/22/22 2155  12/22/22 2200 12/23/22 0436  HGB 11.0* 10.7* 10.9* 10.9*  HCT 33.6* 33.4* 32.0* 33.4*  MCV 89.7 91.8  --  90.0  -Check Anemia Panel in the AM -Continue to Monitor for S/Sx of Bleeding; no overt bleeding noted -Repeat CBC in the AM  Hypoalbuminemia -[name] [name]'s Albumin Trend: Recent Labs  Lab 12/17/22 0828 12/22/22 2155 12/23/22 0436  ALBUMIN 3.9 3.4* 3.3*  -Continue to Monitor and Trend and repeat CMP in the AM  Obesity -Complicates overall prognosis and care -Estimated body mass index is 30.9 kg/m as calculated from the following:   Height as of this encounter: 5' 2.75" (1.594 m).   Weight as of this encounter: 78.5 kg.  -Weight Loss and Dietary Counseling given  Continue to monitor [name] [name]'s clinical response to intervention and follow-up on neurology recommendations and initiate antibiotics for suspected UTI and anticipate discharge in the next 24 to 48 hours once Neurology has cleared the [name] [name] for discharge

## 2022-12-23 NOTE — Care Management Obs Status (Signed)
MEDICARE OBSERVATION STATUS NOTIFICATION   Patient Details  Name: [name] [name] MRN: [id number] Date of Birth: 1941/01/09   Medicare Observation Status Notification Given:  Yes    Kermit Balo, RN 12/23/2022, 3:54 PM

## 2022-12-23 NOTE — H&P (Signed)
History and Physical    [name] [name]:[id number] DOB: 03-Sep-1940 DOA: 12/22/2022  DOS: the patient was seen and examined on 12/22/2022  PCP: Judy Pimple, MD   Patient coming from: Home  I have personally briefly reviewed patient's old medical records in [facility]  CC: sudden weakness HPI: 82 year old female history of coronary disease status post stent, hyperlipidemia, hypertension, obesity, chronic pain on hydrocodone, presents to the ER today with sudden weakness around 7:30 PM.  Patient had a primary care via office visit yesterday.  She went out to lunch with her daughter and another relative.  They drove to [place] to go gambling.  They got home around 6:00 PM. around 7:30 PM, patient developed sudden acute generalized weakness.  No slurred speech.  No focality.  Patient brought in to the ER for evaluation.  On arrival temp 98 heart rate 67 blood pressure 199/77.  White count 10.0, hemoglobin 10.7, platelets of 217  First set of troponin is negative.  CT head negative for acute intracranial abnormality.  CT angio negative for large vessel occlusion.  MRI brain has been ordered.  Neurology has seen the patient.  [facility] contacted for admission.   ED Course: CT head/CTA negative for CVA/LVO.  Review of Systems:  Review of Systems  Constitutional: Negative.  Negative for chills and fever.  HENT: Negative.    Eyes: Negative.   Respiratory: Negative.    Cardiovascular: Negative.   Gastrointestinal:  Positive for nausea.  Genitourinary:        Chronic urinary incontinence/over active bladder  Musculoskeletal: Negative.   Skin: Negative.   Neurological:  Positive for weakness. Negative for dizziness, sensory change, speech change, focal weakness and headaches.  Endo/Heme/Allergies: Negative.   Psychiatric/Behavioral: Negative.    All other systems reviewed and are negative.   Past Medical History:  Diagnosis Date   Anemia    of chronic  disease   Anxiety    Arthritis    "psoratic arthritis; new dx" (09/11/2016)   CAD S/P percutaneous coronary angioplasty 08/2016   mLCx 90% -> 0% (2.75 mm x 16 mm) Synergy DES PCI Ostial D1 70% (not PTCA target). Moderate ostial and mD2.  LAD tandem 50% stenoses w/ negative FFR (0.86)   Cancer (HCC)    skin cancer on leg   Chronic lower back pain    Complication of anesthesia 1980 and 1982   trouble waking up after breast biopsy, many surgeries since no problem   Depression    Dyspnea on effort - CHRONIC    partial relief of Sx post PCI of L Circumflex.   Fibrocystic breast    GERD (gastroesophageal reflux disease)    Headache    "monthly" (09/11/2016)   History of blood transfusion    "several since age 51; I've had them after most of my surgeries" (09/11/2016)   History of myocardial infarction     - Myoview [name] 2018 indicated small Inferior - Inferoseptal Infarct (vs. rest ischemia). Echo with inferoseptal hypokinesis.   Hx of skin cancer, basal cell 2013 and 2012   right inner, lower leg; several scattered around my body   Hypertension    Iron deficiency anemia    "I've had an iron infusion"   Osteoarthritis    Osteopenia    Peptic ulcer disease    H pylori   PMR (polymyalgia rheumatica) (HCC)    PONV (postoperative nausea and vomiting)    Psoriasis    TIA (transient ischemic attack) 06/03/2020  Symptoms of right arm weakness/numbness and slurred speech as well as right droopm => MRI brain/MRA head normal.  Normal echo normal carotid Dopplers.    Past Surgical History:  Procedure Laterality Date   APPENDECTOMY     BACK SURGERY     x 6    BREAST BIOPSY Left 1980 and 1982   "benign"   CARDIAC CATHETERIZATION N/A 09/11/2016   Procedure: Left Heart Cath and Coronary Angiography;  Surgeon: Marykay Lex, MD;  Location: [facility] INVASIVE CV LAB;  Service: Cardiovascular: mLCx 90% * (PCI). Ostial D1 70% (not PTCA target). Mod ostial and mD2.  LAD tandem 50% stenoses w/ negative  FFR (0.86)   CARDIAC CATHETERIZATION N/A 09/11/2016   Procedure: Intravascular Pressure Wire/FFR Study;  Surgeon: Marykay Lex, MD;  Location: [facility] INVASIVE CV LAB;  Service: Cardiovascular: --  LAD tandem 50% stenoses w/ negative FFR (0.86   CARDIAC CATHETERIZATION N/A 09/11/2016   Procedure: Coronary Stent Intervention;  Surgeon: Marykay Lex, MD;  Location: [facility] INVASIVE CV LAB;  Service: Cardiovascular: -- DES PCI mLCx 90%--0% SYNERGY DES 2.75 MM X 16 MM.   CARDIAC CATHETERIZATION  2001   non-obstructive CAD   CATARACT EXTRACTION W/ INTRAOCULAR LENS  IMPLANT, BILATERAL  July -  August 2017   DILATION AND CURETTAGE OF UTERUS  X 3   ESOPHAGOGASTRODUODENOSCOPY  03/2009   ulcer,HP, GERD stricture   EXCISIONAL HEMORRHOIDECTOMY     HIP ARTHROPLASTY Left 2010   JOINT REPLACEMENT     KNEE ARTHROSCOPY Bilateral    LAPAROSCOPIC CHOLECYSTECTOMY  2005   LUMBAR DISC SURGERY  11/1984; 1987; 1989; 2004; 2005 X 2   deg. disk lumber spine    MOHS SURGERY Right    "inside my lower leg"   NASAL SINUS SURGERY  1970s   NM MYOVIEW LTD  08/28/2016   INTERMEDIATE RISK (b/c reduced EF & ? small infarct) -- EF ~35-40%.  ? Prior Small Inferior-apical & septal infarct (w/o ischemia).  Difuse HK - worse in inferoapex.  Suggest prior infarct with no ischemia. Ischemic area correlates with echo WMA   NM MYOVIEW LTD  05/27/2021   Findings consistent with prior anteroapical infarct with fixed defect.  No peri-infarct ischemia.  Notable improvement from prior study.:   ORIF FEMUR FRACTURE Right 01/04/2020   Procedure: OPEN REDUCTION INTERNAL FIXATION (ORIF) DISTAL FEMUR FRACTURE;  Surgeon: Samson Frederic, MD;  Location: [facility] OR;  Service: Orthopedics;  Laterality: Right;   REVERSE SHOULDER ARTHROPLASTY Left 12/17/2017   Procedure: LEFT REVERSE SHOULDER ARTHROPLASTY;  Surgeon: Beverely Low, MD;  Location: [facility] OR;  Service: Orthopedics;  Laterality: Left;   TOTAL HIP ARTHROPLASTY Right 04/04/2013   Procedure: RIGHT  TOTAL HIP ARTHROPLASTY ANTERIOR APPROACH;  Surgeon: Shelda Pal, MD;  Location: [facility] ORS;  Service: Orthopedics;  Laterality: Right;   TOTAL KNEE ARTHROPLASTY Right 2009   TRANSTHORACIC ECHOCARDIOGRAM  1/'18; 1/'19   a) 1/'18: mild conc LVH. EF 45-50% - anteroseptal & inferoseptal HK. Mod AI.; b) 1/'19 - post PCI: (post PCI) --> EF normalized:  Normal LV systolic function; mild diastolic dysfunction; mild   LVH; sclerotic aortic valve with mild AI.  No RWMA   TRANSTHORACIC ECHOCARDIOGRAM  06/04/2020    for TIA: Normal LV size and function.  No or WMA.  EF 60 to 65%.  GR 1 DD with elevated LAP.  Normal RV size and function.  Normal RVP, RAP.  Mild aortic valve calcification-sclerosis but no stenosis.  Trivial AI.  No embolic source  TUBAL LIGATION     VAGINAL HYSTERECTOMY  1970s   partial      reports that she has never smoked. She has never used smokeless tobacco. She reports current alcohol use. She reports that she does not use drugs.  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   Ace Inhibitors     cough   Hydrochlorothiazide     Hyponatremia    Oxaprozin     GI upset    Pregabalin Swelling    SWELLING REACTION UNSPECIFIED    Amoxicillin-Pot Clavulanate Nausea And Vomiting    gi upset Has patient had a PCN reaction causing immediate rash, facial/tongue/throat swelling, SOB or lightheadedness with hypotension: No Has patient had a PCN reaction causing severe rash involving mucus membranes or skin necrosis: No Has patient had a PCN reaction that required hospitalization No Has patient had a PCN reaction occurring within the last 10 years: No If all of the above answers are "NO", then may proceed with Cephalosporin use.     Family History  Problem Relation Age of Onset   Arthritis Mother    Emphysema Mother    Hyperlipidemia Mother    Hypertension Mother    Heart disease Mother    Heart disease Brother         CAD   Diabetes Paternal Grandmother    Heart disease Maternal Aunt     Prior to Admission medications   Medication Sig Start Date End Date Taking? Authorizing Provider  acetaminophen (TYLENOL) 500 MG tablet Take 1,000 mg by mouth 2 (two) times daily as needed for moderate pain or headache.     [provider]  albuterol (VENTOLIN HFA) 108 (90 Base) MCG/ACT inhaler INHALE 1 PUFF INTO THE LUNGS EVERY 6 (SIX) HOURS AS NEEDED (FOR COUGH). 02/04/21   Tower, Audrie Gallus, MD  aspirin EC 81 MG tablet Take 1 tablet (81 mg total) by mouth daily. Start after you complete taking Clopidogrel ( Plavix) Swallow whole. 05/13/22   Marykay Lex, MD  atorvastatin (LIPITOR) 40 MG tablet Take 1 tablet (40 mg total) by mouth daily. 12/18/22 03/18/23  Marykay Lex, MD  benzonatate (TESSALON) 200 MG capsule TAKE 1 CAPSULE (200 MG TOTAL) BY MOUTH 3 (THREE) TIMES DAILY AS NEEDED FOR COUGH. 02/23/22   Gweneth Dimitri, MD  busPIRone (BUSPAR) 15 MG tablet TAKE 1 TABLET BY MOUTH EVERY DAY 07/14/22   Tower, Audrie Gallus, MD  cetirizine (ZYRTEC) 10 MG tablet TAKE 1 TABLET BY MOUTH EVERY DAY 12/18/22   Tower, Audrie Gallus, MD  fluticasone (FLONASE) 50 MCG/ACT nasal spray PLACE 1 SPRAY INTO BOTH NOSTRILS DAILY AS NEEDED. 05/25/22   Tower, Audrie Gallus, MD  HYDROcodone-acetaminophen (NORCO/VICODIN) 5-325 MG tablet Take 1 tablet by mouth 3 (three) times daily as needed for moderate pain. 03/08/20   Serena Croissant, MD  losartan (COZAAR) 25 MG tablet TAKE 1 TABLET BY MOUTH EVERY DAY 04/10/22   Tower, Audrie Gallus, MD  Melatonin 3 MG CAPS Take 6 mg by mouth at bedtime.     [provider]  methocarbamol (ROBAXIN) 500 MG tablet TAKE 1 TABLET BY MOUTH EVERY 8 HOURS AS NEEDED FOR MUSCLE SPASM 12/20/22   Tower, Audrie Gallus, MD  metoprolol tartrate (LOPRESSOR) 25 MG tablet TAKE 1 TABLET BY MOUTH TWICE A DAY 10/15/22   Marykay Lex, MD  nitroGLYCERIN (NITROSTAT) 0.4 MG SL tablet Place 1 tablet (0.4 mg total) under the tongue every  5 (five) minutes as  needed. Patient not taking: Reported on 05/13/2022 11/07/21 02/05/22  Marykay Lex, MD  nystatin (MYCOSTATIN/NYSTOP) powder Apply topically 2 (two) times daily as needed. To affected areas to prevent yeast (in skin folds) 07/23/20   Tower, Audrie Gallus, MD  pantoprazole (PROTONIX) 40 MG tablet TAKE 1 TABLET BY MOUTH EVERY DAY 06/29/22   Marykay Lex, MD  sertraline (ZOLOFT) 25 MG tablet TAKE 1 TABLET (25 MG TOTAL) BY MOUTH DAILY. 10/21/22   Tower, Audrie Gallus, MD  Vitamin D, Cholecalciferol, 25 MCG (1000 UT) CAPS Take 2,000 Units by mouth in the morning and at bedtime.     [provider]    Physical Exam: Vitals:   12/23/22 0105 12/23/22 0130 12/23/22 0230 12/23/22 0300  BP: (!) 171/89 (!) 174/73 (!) 156/89 (!) 158/66  Pulse: 80 75 72 69  Resp: 16   17  Temp:      TempSrc:      SpO2: 100% 97% 96% 96%    Physical Exam Vitals and nursing note reviewed.  Constitutional:      General: She is not in acute distress.    Appearance: Normal appearance. She is obese. She is not ill-appearing, toxic-appearing or diaphoretic.  HENT:     Head: Normocephalic and atraumatic.     Nose: Nose normal.  Eyes:     General: No scleral icterus. Cardiovascular:     Rate and Rhythm: Normal rate and regular rhythm.     Pulses: Normal pulses.     Heart sounds: Normal heart sounds.  Pulmonary:     Effort: Pulmonary effort is normal. No respiratory distress.     Breath sounds: Normal breath sounds. No wheezing or rales.  Abdominal:     General: Bowel sounds are normal. There is no distension.     Palpations: Abdomen is soft.     Tenderness: There is no abdominal tenderness. There is no guarding or rebound.  Musculoskeletal:     Right lower leg: No edema.     Left lower leg: No edema.  Skin:    General: Skin is warm and dry.     Capillary Refill: Capillary refill takes less than 2 seconds.  Neurological:     General: No focal deficit present.     Mental Status: She is alert and oriented to  person, place, and time.      Labs on Admission: I have personally reviewed following labs and imaging studies  CBC: Recent Labs  Lab 12/17/22 0828 12/22/22 2155 12/22/22 2200  WBC 7.3 10.0  --   NEUTROABS 5.5 7.3  --   HGB 11.0* 10.7* 10.9*  HCT 33.6* 33.4* 32.0*  MCV 89.7 91.8  --   PLT 209.0 217  --    Basic Metabolic Panel: Recent Labs  Lab 12/17/22 0828 12/22/22 2155 12/22/22 2200  NA 141 136 135  K 4.4 4.1 4.2  CL 105 103 103  CO2 28 24  --   GLUCOSE 89 124* 118*  BUN 22 19 21   CREATININE 0.99 1.02* 0.90  CALCIUM 9.1 8.8*  --    GFR: Estimated Creatinine Clearance: 46.7 mL/min (by C-G formula based on SCr of 0.9 mg/dL). Liver Function Tests: Recent Labs  Lab 12/17/22 0828 12/22/22 2155  AST 17 21  ALT 9 15  ALKPHOS 57 50  BILITOT 0.4 0.7  PROT 6.9 6.5  ALBUMIN 3.9 3.4*   Recent Labs  Lab 12/22/22 2155  LIPASE 47    Coagulation Profile:  Recent Labs  Lab 12/22/22 2155  INR 1.1   Cardiac Enzymes: Recent Labs  Lab 12/22/22 2155  TROPONINIHS 6   Thyroid Function Tests: Recent Labs    12/22/22 2155  TSH 2.419   Urine analysis:    Component Value Date/Time   COLORURINE YELLOW 12/22/2022 2356   APPEARANCEUR CLEAR 12/22/2022 2356   LABSPEC 1.009 12/22/2022 2356   LABSPEC 1.010 11/17/2013 1237   PHURINE 6.0 12/22/2022 2356   GLUCOSEU NEGATIVE 12/22/2022 2356   GLUCOSEU Negative 11/17/2013 1237   HGBUR NEGATIVE 12/22/2022 2356   HGBUR small 06/03/2007 1129   BILIRUBINUR NEGATIVE 12/22/2022 2356   BILIRUBINUR Negative 11/17/2013 1237   KETONESUR NEGATIVE 12/22/2022 2356   PROTEINUR NEGATIVE 12/22/2022 2356   UROBILINOGEN 0.2 11/17/2013 1237   NITRITE POSITIVE (A) 12/22/2022 2356   LEUKOCYTESUR SMALL (A) 12/22/2022 2356   LEUKOCYTESUR Trace 11/17/2013 1237    Radiological Exams on Admission: I have personally reviewed images CT ANGIO HEAD NECK W WO CM  Result Date: 12/23/2022 CLINICAL DATA:  Dizziness EXAM: CT ANGIOGRAPHY HEAD  AND NECK WITH AND WITHOUT CONTRAST TECHNIQUE: Multidetector CT imaging of the head and neck was performed using the standard protocol during bolus administration of intravenous contrast. Multiplanar CT image reconstructions and MIPs were obtained to evaluate the vascular anatomy. Carotid stenosis measurements (when applicable) are obtained utilizing NASCET criteria, using the distal internal carotid diameter as the denominator. RADIATION DOSE REDUCTION: This exam was performed according to the departmental dose-optimization program which includes automated exposure control, adjustment of the mA and/or kV according to patient size and/or use of iterative reconstruction technique. CONTRAST:  75mL OMNIPAQUE IOHEXOL 350 MG/ML SOLN COMPARISON:  None Available. FINDINGS: CT HEAD FINDINGS Brain: There is no mass, hemorrhage or extra-axial collection. The size and configuration of the ventricles and extra-axial CSF spaces are normal. There is no acute or chronic infarction. The brain parenchyma is normal. Skull: The visualized skull base, calvarium and extracranial soft tissues are normal. Sinuses/Orbits: No fluid levels or advanced mucosal thickening of the visualized paranasal sinuses. No mastoid or middle ear effusion. The orbits are normal. CTA NECK FINDINGS SKELETON: There is no bony spinal canal stenosis. No lytic or blastic lesion. OTHER NECK: Normal pharynx, larynx and major salivary glands. No cervical lymphadenopathy. Unremarkable thyroid gland. UPPER CHEST: No pneumothorax or pleural effusion. No nodules or masses. AORTIC ARCH: There is calcific atherosclerosis of the aortic arch. There is no aneurysm, dissection or hemodynamically significant stenosis of the visualized portion of the aorta. Conventional 3 vessel aortic branching pattern. The visualized proximal subclavian arteries are widely patent. RIGHT CAROTID SYSTEM: No dissection, occlusion or aneurysm. Mild atherosclerotic calcification at the carotid  bifurcation without hemodynamically significant stenosis. LEFT CAROTID SYSTEM: No dissection, occlusion or aneurysm. Mild atherosclerotic calcification at the carotid bifurcation without hemodynamically significant stenosis. VERTEBRAL ARTERIES: Left dominant configuration. Both origins are clearly patent. There is no dissection, occlusion or flow-limiting stenosis to the skull base (V1-V3 segments). CTA HEAD FINDINGS POSTERIOR CIRCULATION: --Vertebral arteries: Normal V4 segments. --Inferior cerebellar arteries: Normal. --Basilar artery: Normal. --Superior cerebellar arteries: Normal. --Posterior cerebral arteries (PCA): Normal. ANTERIOR CIRCULATION: --Intracranial internal carotid arteries: Normal. --Anterior cerebral arteries (ACA): Normal. Both A1 segments are present. Patent anterior communicating artery (a-comm). --Middle cerebral arteries (MCA): Normal. VENOUS SINUSES: As permitted by contrast timing, patent. ANATOMIC VARIANTS: None Review of the MIP images confirms the above findings. IMPRESSION: 1. No emergent large vessel occlusion or hemodynamically significant stenosis of the head or neck. 2. Mild bilateral carotid bifurcation  atherosclerosis without hemodynamically significant stenosis. Aortic atherosclerosis (ICD10-I70.0). Electronically Signed   By: Deatra Robinson M.D.   On: 12/23/2022 00:32   CT HEAD CODE STROKE WO CONTRAST  Result Date: 12/22/2022 CLINICAL DATA:  Code stroke. Bilateral arm weakness and slurred speech EXAM: CT HEAD WITHOUT CONTRAST TECHNIQUE: Contiguous axial images were obtained from the base of the skull through the vertex without intravenous contrast. RADIATION DOSE REDUCTION: This exam was performed according to the departmental dose-optimization program which includes automated exposure control, adjustment of the mA and/or kV according to patient size and/or use of iterative reconstruction technique. COMPARISON:  None Available. FINDINGS: Brain: There is no mass, hemorrhage or  extra-axial collection. The size and configuration of the ventricles and extra-axial CSF spaces are normal. The brain parenchyma is normal, without evidence of acute or chronic infarction. Vascular: Atherosclerotic calcification of the internal carotid arteries at the skull base. No abnormal hyperdensity of the major intracranial arteries or dural venous sinuses. Skull: The visualized skull base, calvarium and extracranial soft tissues are normal. Sinuses/Orbits: No fluid levels or advanced mucosal thickening of the visualized paranasal sinuses. No mastoid or middle ear effusion. The orbits are normal. ASPECTS [facility] Stroke Program Early CT Score) - Ganglionic level infarction (caudate, lentiform nuclei, internal capsule, insula, M1-M3 cortex): 7 - Supraganglionic infarction (M4-M6 cortex): 3 Total score (0-10 with 10 being normal): 10 IMPRESSION: 1. No acute intracranial abnormality. 2. ASPECTS is 10. These results were communicated to Dr. Brooke Dare at 10:13 pm on 12/22/2022 by text page via the [facility] messaging system. Electronically Signed   By: Deatra Robinson M.D.   On: 12/22/2022 22:13   DG Chest Portable 1 View  Result Date: 12/22/2022 CLINICAL DATA:  Weakness EXAM: PORTABLE CHEST 1 VIEW COMPARISON:  Chest x-ray 11/04/2021 FINDINGS: The heart size and mediastinal contours are within normal limits. Both lungs are clear. Left shoulder arthroplasty is present. IMPRESSION: No active disease. Electronically Signed   By: Darliss Cheney M.D.   On: 12/22/2022 21:39    EKG: My personal interpretation of EKG shows: NSR, IVCD    Assessment/Plan Principal Problem:   TIA (transient ischemic attack) Active Problems:   Hyperlipidemia with target low density lipoprotein (LDL) cholesterol less than 70 mg/dL   Essential hypertension   Obesity (BMI 30-39.9)   CAD S/P percutaneous coronary angioplasty   Asymptomatic bacteriuria    Assessment and Plan: * TIA (transient ischemic attack) Observation  med/tele bed. MRI brain completed. Awaiting final report. Permissive HTN for now per neuro consult note. Keep BP <220/110. Check echo. Check lipid panel. Started on plavix. Continue asa.  CAD S/P percutaneous coronary angioplasty Stable. On lipitor and asa, lopressor 25 mg bid.  Obesity (BMI 30-39.9) Bmi 30.89  Essential hypertension Allow for permissive htn per neuro consult. At home, pt on losartan 25 mg qd and lopressor 25 mg bid. Recently saw cardiology this past week. If BP remains elevated, increase losartan to 50 mg daily.  Hyperlipidemia with target low density lipoprotein (LDL) cholesterol less than 70 mg/dL Continue  lipitor 40 mg  Asymptomatic bacteriuria Patient denies any dysuria.  Patient chronically has urinary frequency due to urinary incontinence.  Do not start antibiotics.   DVT prophylaxis: SQ Heparin Code Status: Full Code Family Communication: discussed with pt and dtr cathy at bedside  Disposition Plan: return home  Consults called: neurology  Admission status: Observation, Telemetry bed   Carollee Herter, DO [facility] 12/23/2022, 3:57 AM

## 2022-12-23 NOTE — Progress Notes (Signed)
STROKE TEAM PROGRESS NOTE   SUBJECTIVE (INTERVAL HISTORY) Her daughter is at the bedside.  Overall her condition is completely resolved.  Patient stated that she had a lot of activity yesterday with her sister-in-law, did not have good lunch.  1 home yesterday afternoon, felt joint pain, took pain medication and then she felt lightheadedness, nauseous, slow responding, difficulty speaking.  Currently symptoms all resolved.  MRI negative for stroke.   OBJECTIVE Temp:  [97.8 F (36.6 C)-98.3 F (36.8 C)] 98.3 F (36.8 C) (05/01 1513) Pulse Rate:  [65-84] 77 (05/01 1513) Cardiac Rhythm: Heart block (05/01 0751) Resp:  [10-19] 16 (05/01 1108) BP: (135-213)/(64-120) 143/65 (05/01 1513) SpO2:  [94 %-100 %] 96 % (05/01 1513) Weight:  [78.5 kg] 78.5 kg (05/01 0900)  No results for input(s): "GLUCAP" in the last 168 hours. Recent Labs  Lab 12/17/22 0828 12/22/22 2155 12/22/22 2200 12/23/22 0436  NA 141 136 135 136  K 4.4 4.1 4.2 3.8  CL 105 103 103 104  CO2 28 24  --  22  GLUCOSE 89 124* 118* 114*  BUN 22 19 21 15   CREATININE 0.99 1.02* 0.90 0.94  CALCIUM 9.1 8.8*  --  8.7*  MG  --   --   --  1.8  PHOS  --   --   --  3.7   Recent Labs  Lab 12/17/22 0828 12/22/22 2155 12/23/22 0436  AST 17 21 20   ALT 9 15 14   ALKPHOS 57 50 51  BILITOT 0.4 0.7 0.6  PROT 6.9 6.5 6.2*  ALBUMIN 3.9 3.4* 3.3*   Recent Labs  Lab 12/17/22 0828 12/22/22 2155 12/22/22 2200 12/23/22 0436  WBC 7.3 10.0  --  11.8*  NEUTROABS 5.5 7.3  --  8.7*  HGB 11.0* 10.7* 10.9* 10.9*  HCT 33.6* 33.4* 32.0* 33.4*  MCV 89.7 91.8  --  90.0  PLT 209.0 217  --  219   No results for input(s): "CKTOTAL", "CKMB", "CKMBINDEX", "TROPONINI" in the last 168 hours. Recent Labs    12/22/22 2155  LABPROT 14.0  INR 1.1   Recent Labs    12/22/22 2356  COLORURINE YELLOW  LABSPEC 1.009  PHURINE 6.0  GLUCOSEU NEGATIVE  HGBUR NEGATIVE  BILIRUBINUR NEGATIVE  KETONESUR NEGATIVE  PROTEINUR NEGATIVE  NITRITE  POSITIVE*  LEUKOCYTESUR SMALL*       Component Value Date/Time   CHOL 149 12/23/2022 0436   CHOL 128 10/27/2016 1015   TRIG 154 (H) 12/23/2022 0436   HDL 42 12/23/2022 0436   HDL 37 (L) 10/27/2016 1015   CHOLHDL 3.5 12/23/2022 0436   VLDL 31 12/23/2022 0436   LDLCALC 76 12/23/2022 0436   LDLCALC 52 10/27/2016 1015   Lab Results  Component Value Date   HGBA1C 5.7 12/17/2022      Component Value Date/Time   LABOPIA POSITIVE (A) 12/22/2022 2356   COCAINSCRNUR NONE DETECTED 12/22/2022 2356   LABBENZ NONE DETECTED 12/22/2022 2356   AMPHETMU NONE DETECTED 12/22/2022 2356   THCU NONE DETECTED 12/22/2022 2356   LABBARB NONE DETECTED 12/22/2022 2356    Recent Labs  Lab 12/22/22 2155  ETH <10    I have personally reviewed the radiological images below and agree with the radiology interpretations.  ECHOCARDIOGRAM COMPLETE  Result Date: 12/23/2022    ECHOCARDIOGRAM REPORT   Patient Name:   KYLENA MOLE Date of Exam: 12/23/2022 Medical Rec #:  [id number]       Height:       62.8 in  Accession #:    [id number]      Weight:       173.0 lb Date of Birth:  [birth year]        BSA:          1.813 m Patient Age:    82 years        BP:           211/93 mmHg Patient Gender: F               HR:           77 bpm. Exam Location:  Inpatient Procedure: 2D Echo, Cardiac Doppler and Color Doppler Indications:    TIA  History:        Patient has prior history of Echocardiogram examinations, most                 recent 06/04/2020. CAD; Risk Factors:Hypertension, Diabetes and                 HLD.  Sonographer:    Lucy Antigua Referring Phys: [phone] ERIC CHEN IMPRESSIONS  1. Left ventricular ejection fraction, by estimation, is 60 to 65%. The left ventricle has normal function. Left ventricular endocardial border not optimally defined to evaluate regional wall motion. There is mild concentric left ventricular hypertrophy. Left ventricular diastolic parameters are consistent with Grade I diastolic dysfunction  (impaired relaxation).  2. Right ventricular systolic function is hyperdynamic. The right ventricular size is normal.  3. A small pericardial effusion is present. The pericardial effusion is circumferential.  4. The mitral valve was not well visualized. No evidence of mitral valve regurgitation.  5. The aortic valve is tricuspid. There is mild calcification of the aortic valve. There is mild thickening of the aortic valve. Aortic valve regurgitation is trivial.  6. The inferior vena cava is normal in size with greater than 50% respiratory variability, suggesting right atrial pressure of 3 mmHg. Comparison(s): No significant change from prior study. FINDINGS  Left Ventricle: Left ventricular ejection fraction, by estimation, is 60 to 65%. The left ventricle has normal function. Left ventricular endocardial border not optimally defined to evaluate regional wall motion. The left ventricular internal cavity size was normal in size. There is mild concentric left ventricular hypertrophy. Left ventricular diastolic parameters are consistent with Grade I diastolic dysfunction (impaired relaxation). Right Ventricle: The right ventricular size is normal. No increase in right ventricular wall thickness. Right ventricular systolic function is hyperdynamic. Left Atrium: Left atrial size was not well visualized. Right Atrium: Right atrial size was not well visualized. Pericardium: A small pericardial effusion is present. The pericardial effusion is circumferential. Mitral Valve: The mitral valve was not well visualized. No evidence of mitral valve regurgitation. Tricuspid Valve: The tricuspid valve is normal in structure. Tricuspid valve regurgitation is not demonstrated. Aortic Valve: The aortic valve is tricuspid. There is mild calcification of the aortic valve. There is mild thickening of the aortic valve. There is mild aortic valve annular calcification. Aortic valve regurgitation is trivial. Aortic valve mean gradient  measures 5.0 mmHg. Aortic valve peak gradient measures 10.0 mmHg. Aortic valve area, by VTI measures 1.54 cm. Pulmonic Valve: The pulmonic valve was normal in structure. Pulmonic valve regurgitation is trivial. No evidence of pulmonic stenosis. Aorta: The aortic root and ascending aorta are structurally normal, with no evidence of dilitation. Venous: The inferior vena cava is normal in size with greater than 50% respiratory variability, suggesting right atrial pressure of 3 mmHg. IAS/Shunts: The interatrial septum  was not well visualized.  LEFT VENTRICLE PLAX 2D LVIDd:         4.00 cm   Diastology LVIDs:         2.60 cm   LV e' medial:    5.44 cm/s LV PW:         1.10 cm   LV E/e' medial:  10.6 LV IVS:        1.20 cm   LV e' lateral:   5.33 cm/s LVOT diam:     1.80 cm   LV E/e' lateral: 10.8 LV SV:         45 LV SV Index:   25 LVOT Area:     2.54 cm  RIGHT VENTRICLE RV S prime:     13.90 cm/s AORTIC VALVE AV Area (Vmax):    1.67 cm AV Area (Vmean):   1.61 cm AV Area (VTI):     1.54 cm AV Vmax:           158.00 cm/s AV Vmean:          106.000 cm/s AV VTI:            0.290 m AV Peak Grad:      10.0 mmHg AV Mean Grad:      5.0 mmHg LVOT Vmax:         104.00 cm/s LVOT Vmean:        67.200 cm/s LVOT VTI:          0.175 m LVOT/AV VTI ratio: 0.60  AORTA Ao Root diam: 2.80 cm Ao Asc diam:  3.10 cm MITRAL VALVE                TRICUSPID VALVE MV Area (PHT): 3.86 cm     TR Peak grad:   11.3 mmHg MV E velocity: 57.50 cm/s   TR Vmax:        168.00 cm/s MV A velocity: 105.00 cm/s MV E/A ratio:  0.55         SHUNTS                             Systemic VTI:  0.18 m                             Systemic Diam: 1.80 cm Riley Lam MD Electronically signed by Riley Lam MD Signature Date/Time: 12/23/2022/12:01:22 PM    Final    MR BRAIN WO CONTRAST  Result Date: 12/23/2022 CLINICAL DATA:  Acute neurologic deficit EXAM: MRI HEAD WITHOUT CONTRAST TECHNIQUE: Multiplanar, multiecho pulse sequences of the brain and  surrounding structures were obtained without intravenous contrast. COMPARISON:  None Available. FINDINGS: Brain: No acute infarct, mass effect or extra-axial collection. No acute or chronic hemorrhage. There is multifocal hyperintense T2-weighted signal within the white matter. Parenchymal volume and CSF spaces are normal. The midline structures are normal. Vascular: Major flow voids are preserved. Skull and upper cervical spine: Normal calvarium and skull base. Visualized upper cervical spine and soft tissues are normal. Sinuses/Orbits:No paranasal sinus fluid levels or advanced mucosal thickening. No mastoid or middle ear effusion. Normal orbits. IMPRESSION: 1. No acute intracranial abnormality. 2. Findings of chronic small vessel ischemia. Electronically Signed   By: Deatra Robinson M.D.   On: 12/23/2022 01:21   CT ANGIO HEAD NECK W WO CM  Result Date: 12/23/2022 CLINICAL DATA:  Dizziness EXAM: CT ANGIOGRAPHY HEAD AND  NECK WITH AND WITHOUT CONTRAST TECHNIQUE: Multidetector CT imaging of the head and neck was performed using the standard protocol during bolus administration of intravenous contrast. Multiplanar CT image reconstructions and MIPs were obtained to evaluate the vascular anatomy. Carotid stenosis measurements (when applicable) are obtained utilizing NASCET criteria, using the distal internal carotid diameter as the denominator. RADIATION DOSE REDUCTION: This exam was performed according to the departmental dose-optimization program which includes automated exposure control, adjustment of the mA and/or kV according to patient size and/or use of iterative reconstruction technique. CONTRAST:  75mL OMNIPAQUE IOHEXOL 350 MG/ML SOLN COMPARISON:  None Available. FINDINGS: CT HEAD FINDINGS Brain: There is no mass, hemorrhage or extra-axial collection. The size and configuration of the ventricles and extra-axial CSF spaces are normal. There is no acute or chronic infarction. The brain parenchyma is normal.  Skull: The visualized skull base, calvarium and extracranial soft tissues are normal. Sinuses/Orbits: No fluid levels or advanced mucosal thickening of the visualized paranasal sinuses. No mastoid or middle ear effusion. The orbits are normal. CTA NECK FINDINGS SKELETON: There is no bony spinal canal stenosis. No lytic or blastic lesion. OTHER NECK: Normal pharynx, larynx and major salivary glands. No cervical lymphadenopathy. Unremarkable thyroid gland. UPPER CHEST: No pneumothorax or pleural effusion. No nodules or masses. AORTIC ARCH: There is calcific atherosclerosis of the aortic arch. There is no aneurysm, dissection or hemodynamically significant stenosis of the visualized portion of the aorta. Conventional 3 vessel aortic branching pattern. The visualized proximal subclavian arteries are widely patent. RIGHT CAROTID SYSTEM: No dissection, occlusion or aneurysm. Mild atherosclerotic calcification at the carotid bifurcation without hemodynamically significant stenosis. LEFT CAROTID SYSTEM: No dissection, occlusion or aneurysm. Mild atherosclerotic calcification at the carotid bifurcation without hemodynamically significant stenosis. VERTEBRAL ARTERIES: Left dominant configuration. Both origins are clearly patent. There is no dissection, occlusion or flow-limiting stenosis to the skull base (V1-V3 segments). CTA HEAD FINDINGS POSTERIOR CIRCULATION: --Vertebral arteries: Normal V4 segments. --Inferior cerebellar arteries: Normal. --Basilar artery: Normal. --Superior cerebellar arteries: Normal. --Posterior cerebral arteries (PCA): Normal. ANTERIOR CIRCULATION: --Intracranial internal carotid arteries: Normal. --Anterior cerebral arteries (ACA): Normal. Both A1 segments are present. Patent anterior communicating artery (a-comm). --Middle cerebral arteries (MCA): Normal. VENOUS SINUSES: As permitted by contrast timing, patent. ANATOMIC VARIANTS: None Review of the MIP images confirms the above findings.  IMPRESSION: 1. No emergent large vessel occlusion or hemodynamically significant stenosis of the head or neck. 2. Mild bilateral carotid bifurcation atherosclerosis without hemodynamically significant stenosis. Aortic atherosclerosis (ICD10-I70.0). Electronically Signed   By: Deatra Robinson M.D.   On: 12/23/2022 00:32   CT HEAD CODE STROKE WO CONTRAST  Result Date: 12/22/2022 CLINICAL DATA:  Code stroke. Bilateral arm weakness and slurred speech EXAM: CT HEAD WITHOUT CONTRAST TECHNIQUE: Contiguous axial images were obtained from the base of the skull through the vertex without intravenous contrast. RADIATION DOSE REDUCTION: This exam was performed according to the departmental dose-optimization program which includes automated exposure control, adjustment of the mA and/or kV according to patient size and/or use of iterative reconstruction technique. COMPARISON:  None Available. FINDINGS: Brain: There is no mass, hemorrhage or extra-axial collection. The size and configuration of the ventricles and extra-axial CSF spaces are normal. The brain parenchyma is normal, without evidence of acute or chronic infarction. Vascular: Atherosclerotic calcification of the internal carotid arteries at the skull base. No abnormal hyperdensity of the major intracranial arteries or dural venous sinuses. Skull: The visualized skull base, calvarium and extracranial soft tissues are normal. Sinuses/Orbits: No fluid levels or advanced  mucosal thickening of the visualized paranasal sinuses. No mastoid or middle ear effusion. The orbits are normal. ASPECTS [facility] Stroke Program Early CT Score) - Ganglionic level infarction (caudate, lentiform nuclei, internal capsule, insula, M1-M3 cortex): 7 - Supraganglionic infarction (M4-M6 cortex): 3 Total score (0-10 with 10 being normal): 10 IMPRESSION: 1. No acute intracranial abnormality. 2. ASPECTS is 10. These results were communicated to Dr. Brooke Dare at 10:13 pm on 12/22/2022 by text  page via the [facility] messaging system. Electronically Signed   By: Deatra Robinson M.D.   On: 12/22/2022 22:13   DG Chest Portable 1 View  Result Date: 12/22/2022 CLINICAL DATA:  Weakness EXAM: PORTABLE CHEST 1 VIEW COMPARISON:  Chest x-ray 11/04/2021 FINDINGS: The heart size and mediastinal contours are within normal limits. Both lungs are clear. Left shoulder arthroplasty is present. IMPRESSION: No active disease. Electronically Signed   By: Darliss Cheney M.D.   On: 12/22/2022 21:39     PHYSICAL EXAM  Temp:  [97.8 F (36.6 C)-98.3 F (36.8 C)] 98.3 F (36.8 C) (05/01 1513) Pulse Rate:  [65-84] 77 (05/01 1513) Resp:  [10-19] 16 (05/01 1108) BP: (135-213)/(64-120) 143/65 (05/01 1513) SpO2:  [94 %-100 %] 96 % (05/01 1513) Weight:  [78.5 kg] 78.5 kg (05/01 0900)  General - Well nourished, well developed, in no apparent distress.  Ophthalmologic - fundi not visualized due to noncooperation.  Cardiovascular - Regular rhythm and rate.  Mental Status -  Level of arousal and orientation to time, place, and person were intact. Language including expression, naming, repetition, comprehension was assessed and found intact. Attention span and concentration were normal. Fund of Knowledge was assessed and was intact.  Cranial Nerves II - XII - II - Visual field intact OU. III, IV, VI - Extraocular movements intact. V - Facial sensation intact bilaterally. VII - Facial movement intact bilaterally. VIII - Hearing & vestibular intact bilaterally. X - Palate elevates symmetrically. XI - Chin turning & shoulder shrug intact bilaterally. XII - Tongue protrusion intact.  Motor Strength - The patient's strength was normal in all extremities and pronator drift was absent.  Bulk was normal and fasciculations were absent.   Motor Tone - Muscle tone was assessed at the neck and appendages and was normal.  Reflexes - The patient's reflexes were symmetrical in all extremities and she had no pathological  reflexes.  Sensory - Light touch, temperature/pinprick were assessed and were symmetrical.    Coordination - The patient had normal movements in the hands and feet with no ataxia or dysmetria.  Tremor was absent.  Gait and Station - deferred.   ASSESSMENT/PLAN Ms. CAELAN ATCHLEY is a 82 y.o. female with history of CAD status post stenting, hypertension, hyperlipidemia, psoriasis, TIA in 2021 admitted for nausea, lightheadedness, speech difficulty and slowness. No tPA given due to outside window.    Hypertensive encephalopathy versus medication effect  Took pain medication in the afternoon empty stomach BP significantly elevated on presentation CT no showed abnormality CT head and neck unremarkable MRI no acute 2D Echo EF 60 to 65% LDL 76 HgbA1c 5.7 UDS positive for opioids Heparin subcu for VTE prophylaxis aspirin 81 mg daily prior to admission, now on aspirin 81 mg daily and clopidogrel 75 mg daily DAPT for 3 weeks and then Plavix alone. Patient counseled to be compliant with her antithrombotic medications Ongoing aggressive stroke risk factor management Therapy recommendations: Home health PT Disposition: Pending  History of TIA 05/2020 admitted for transient speech difficulty and right hand numbness.  CT, MRI negative.  MRA and carotid Doppler unremarkable.  EF 60 to 65%.  LDL 55, A1c 5.3.  Discharged on DAPT and Lipitor 20  Hypertension Still elevated, up to 213 Recommend close home BP monitoring On losartan and metoprolol Home medication Increase losartan to 50, increase metoprolol to 37.5 twice daily Long term BP goal normotensive  Hyperlipidemia Home meds: Lipitor 40 LDL 76, goal < 70 Now on Lipitor 40 Continue statin at discharge  Other Stroke Risk Factors Advanced age Obesity, Body mass index is 30.9 kg/m.  Coronary artery disease status post stenting  Other Active Problems Psoriasis  Hospital day # 0  Neurology will sign off. Please call with  questions. Pt will follow up with stroke clinic NP at [facility] in about 4 weeks. Thanks for the consult.   Marvel Plan, MD PhD Stroke Neurology 12/23/2022 7:16 PM    To contact Stroke Continuity provider, please refer to [email or web address]. After hours, contact General Neurology

## 2022-12-23 NOTE — Assessment & Plan Note (Signed)
Stable. On lipitor and asa, lopressor 25 mg bid.

## 2022-12-23 NOTE — Assessment & Plan Note (Addendum)
Observation med/tele bed. MRI brain completed. Awaiting final report. Permissive HTN for now per neuro consult note. Keep BP <220/110. Check echo. Check lipid panel. Started on plavix. Continue asa.

## 2022-12-23 NOTE — Evaluation (Addendum)
Physical Therapy Evaluation & Discharge Patient Details Name: [name] [name] MRN: [id number] DOB: [birth year] Today's Date: 12/23/2022  History of Present Illness  Pt is an 82 y.o. female who presented 12/22/22 with sudden weakness and speech difficulty. MRI negative for acute intracranial abnormality. Being worked up for TIA. PMH: CAD s/p stent, HLD, HTN, obesity, anemia, arthritis, cancer, osteopenia, psoriasis, TIA   Clinical Impression  Pt presents with condition above. PTA, she was living with family in a 1-level house with 3 STE, performing functional mobility mod I using a RW vs rollator vs SPC. Currently, pt reports and appears to be at her baseline, performing all functional mobility with a RW or cane without LOB or physical assistance. Pt displays generalized weakness, but it appears to be fairly symmetrical bil. Pt also has deficits in R shoulder ROM, balance, and activity tolerance. Pt could benefit from follow-up with HHPT to address her deficits to promote reduction in risk for falls and prolongation in independence. No further acute PT needs identified, all education completed and questions answered, will sign off. Recommend pt mobilize with nursing staff and mobility techs while here to reduce risk of deconditioning.    BP: 169/78 supine 176/84 sitting 166/80 standing 182/89 sitting after ambulating   -- 139/62 at PCP 12/22/22 per pt and family; reported heaviness in head with positional changes; did quick vestibular assessment with no reproduction of symptoms, seemingly negative     Recommendations for follow up therapy are one component of a multi-disciplinary discharge planning process, led by the attending physician.  Recommendations may be updated based on patient status, additional functional criteria and insurance authorization.  Follow Up Recommendations       Assistance Recommended at Discharge Intermittent Supervision/Assistance  Patient can return home with the  following  A little help with bathing/dressing/bathroom;Assistance with cooking/housework;Direct supervision/assist for medications management;Direct supervision/assist for financial management;Assist for transportation;Help with stairs or ramp for entrance    Equipment Recommendations None recommended by PT (has all needed DME)  Recommendations for Other Services       Functional Status Assessment Patient has not had a recent decline in their functional status (likely progressive decline with age)     Precautions / Restrictions Precautions Precautions: Fall;Other (comment) Precaution Comments: HTN Restrictions Weight Bearing Restrictions: No      Mobility  Bed Mobility Overal bed mobility: Needs Assistance Bed Mobility: Supine to Sit, Sit to Supine     Supine to sit: Supervision, HOB elevated Sit to supine: Supervision, HOB elevated   General bed mobility comments: Extra time and use of bed rail for bed mobility, HOB elevated, supervision for safety    Transfers Overall transfer level: Needs assistance Equipment used: Rolling walker (2 wheels) Transfers: Sit to/from Stand Sit to Stand: Min guard           General transfer comment: Extra time but able to stand without LOB, min guard for safety    Ambulation/Gait Ambulation/Gait assistance: Min guard Gait Distance (Feet): 220 Feet Assistive device: Rolling walker (2 wheels), Straight cane Gait Pattern/deviations: Step-through pattern, Decreased step length - right, Decreased step length - left, Decreased stride length Gait velocity: reduced Gait velocity interpretation: <1.8 ft/sec, indicate of risk for recurrent falls   General Gait Details: Pt with slow step-through gait pattern, displaying fairly symmetrical gait pattern on bil legs, no LOB, using RW majority of time and SPC for final ~10 ft, min guard for safety  Stairs Stairs: Yes Stairs assistance: Min guard Stair Management: Two rails, Alternating  pattern, Forwards, Step to pattern Number of Stairs: 5 (x2 standard height, x3 short height) General stair comments: Ascends with reciprocal stepping pattern and descends with step-to pattern, no LOB, min guard for safety  Wheelchair Mobility    Modified Rankin (Stroke Patients Only) Modified Rankin (Stroke Patients Only) Pre-Morbid Rankin Score: Moderate disability Modified Rankin: Moderate disability     Balance                                             Pertinent Vitals/Pain Pain Assessment Pain Assessment: Faces Faces Pain Scale: Hurts little more Pain Location: head (heaviness with positional changes) Pain Descriptors / Indicators: Heaviness Pain Intervention(s): Limited activity within patient's tolerance, Monitored during session, Repositioned    Home Living Family/patient expects to be discharged to:: Private residence Living Arrangements: Children;Other relatives (son and great grandson) Available Help at Discharge: Family;Available 24 hours/day Type of Home: House Home Access: Stairs to enter Entrance Stairs-Rails: Doctor, general practice of Steps: 3   Home Layout: One level Home Equipment: Agricultural consultant (2 wheels);Rollator (4 wheels);Cane - single point;Tub bench Additional Comments: sleeps in recliner    Prior Function Prior Level of Function : Needs assist             Mobility Comments: Mod I using RW vs rollator vs SPC for mobility ADLs Comments: Wears slip on shoes and does not wear socks at baseline due to difficulty managing and dislike of them     Hand Dominance   Dominant Hand: Right    Extremity/Trunk Assessment   Upper Extremity Assessment Upper Extremity Assessment: Generalized weakness;Defer to OT evaluation (hx of L shoulder replacement and R shoulder ROM deficits currently receiving shots per pt)    Lower Extremity Assessment Lower Extremity Assessment: Generalized weakness (symmetrically weak with MMT  scores of 4- hip flexion, 4 knee extension, 4+ ankle dorsiflexion bil; denied numbness/tingling bil; slow with coordination testing bil)    Cervical / Trunk Assessment Cervical / Trunk Assessment: Normal  Communication   Communication: No difficulties  Cognition Arousal/Alertness: Awake/alert Behavior During Therapy: WFL for tasks assessed/performed Overall Cognitive Status: Within Functional Limits for tasks assessed                                 General Comments: Slow to process and respond at times, but seemingly baseline        General Comments General comments (skin integrity, edema, etc.): BP 169/78 supine, 176/84 sitting, 166/80 standing, 182/89 sitting after ambulating -- 139/62 at PCP 12/22/22 per pt and family; reported heaviness in head with positional changes; did quick vestibular assessment with no reproduction of symptoms; pt agreeable to ambulating with nursing to avoid deconditioning while here and desiring HHPT followup    Exercises     Assessment/Plan    PT Assessment All further PT needs can be met in the next venue of care  PT Problem List Decreased strength;Decreased activity tolerance;Decreased balance;Decreased mobility       PT Treatment Interventions      PT Goals (Current goals can be found in the Care Plan section)  Acute Rehab PT Goals Patient Stated Goal: to get HHPT/OT PT Goal Formulation: All assessment and education complete, DC therapy Time For Goal Achievement: 12/24/22 Potential to Achieve Goals: Good    Frequency  Co-evaluation               AM-PAC PT "6 Clicks" Mobility  Outcome Measure Help needed turning from your back to your side while in a flat bed without using bedrails?: None Help needed moving from lying on your back to sitting on the side of a flat bed without using bedrails?: A Little Help needed moving to and from a bed to a chair (including a wheelchair)?: A Little Help needed standing up from  a chair using your arms (e.g., wheelchair or bedside chair)?: A Little Help needed to walk in hospital room?: A Little Help needed climbing 3-5 steps with a railing? : A Little 6 Click Score: 19    End of Session Equipment Utilized During Treatment: Gait belt Activity Tolerance: Patient tolerated treatment well Patient left: in bed;with call bell/phone within reach;with bed alarm set;with family/visitor present   PT Visit Diagnosis: Unsteadiness on feet (R26.81);Other abnormalities of gait and mobility (R26.89);Muscle weakness (generalized) (M62.81)    Time: 1610-9604 PT Time Calculation (min) (ACUTE ONLY): 37 min   Charges:   PT Evaluation $PT Eval Moderate Complexity: 1 Mod PT Treatments $Therapeutic Activity: 8-22 mins        Raymond Gurney, PT, DPT Acute Rehabilitation Services  Office: [phone]   Jewel Baize 12/23/2022, 9:35 AM

## 2022-12-23 NOTE — Assessment & Plan Note (Signed)
Patient denies any dysuria.  Patient chronically has urinary frequency due to urinary incontinence.  Do not start antibiotics.

## 2022-12-23 NOTE — Assessment & Plan Note (Signed)
Allow for permissive htn per neuro consult. At home, pt on losartan 25 mg qd and lopressor 25 mg bid. Recently saw cardiology this past week. If BP remains elevated, increase losartan to 50 mg daily.

## 2022-12-23 NOTE — ED Notes (Signed)
Patient transported to CT 

## 2022-12-23 NOTE — Assessment & Plan Note (Signed)
Bmi 30.89

## 2022-12-23 NOTE — Progress Notes (Signed)
Mobility Specialist Progress Note   12/23/22 1445  Mobility  Activity Ambulated with assistance in hallway  Level of Assistance Contact guard assist, steadying assist  Assistive Device Front wheel walker  Distance Ambulated (ft) 310 ft  Activity Response Tolerated well  Mobility Referral Yes  $Mobility charge 1 Mobility   Received in [facility] requesting to ambulate in hallway. Pt having slow but steady gait throughout, returned back to bed w/o fault,complaint or physical assist. Left w/ call bell in reach.   Frederico Hamman Mobility Specialist Please contact via SecureChat or  Rehab office at [phone]

## 2022-12-23 NOTE — ED Notes (Signed)
ED TO INPATIENT HANDOFF REPORT  ED Nurse Name and Phone #: Tiburcio Pea [phone])  S Name/Age/Gender [name] [name] 82 y.o. female Room/Bed: 023C/023C  Code Status   Code Status: Full Code  Home/SNF/Other Home Patient oriented to: self, place, time, and situation Is this baseline? Yes   Triage Complete: Triage complete  Chief Complaint TIA (transient ischemic attack) [G45.9]  Triage Note Patient arrived with EMS from home reports fatigue/generalized weakness with dizziness this evening ( approx. 1800)  , CBG= 127 , hypertensive BP= 180/86.    Allergies 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   Ace Inhibitors     cough   Hydrochlorothiazide     Hyponatremia    Oxaprozin     GI upset    Pregabalin Swelling    SWELLING REACTION UNSPECIFIED    Amoxicillin-Pot Clavulanate Nausea And Vomiting    gi upset Has patient had a PCN reaction causing immediate rash, facial/tongue/throat swelling, SOB or lightheadedness with hypotension: No Has patient had a PCN reaction causing severe rash involving mucus membranes or skin necrosis: No Has patient had a PCN reaction that required hospitalization No Has patient had a PCN reaction occurring within the last 10 years: No If all of the above answers are "NO", then may proceed with Cephalosporin use.     Level of Care/Admitting Diagnosis ED Disposition     ED Disposition  Admit   Condition  --   Comment  Hospital Area: [facility] [[id number]]  Level of Care: Telemetry Medical [[id number]]  May place patient in observation at [facility] or [facility] if equivalent level of care is available:: No  Covid Evaluation: Asymptomatic - no recent exposure (last 10 days) testing not required  Diagnosis: TIA (transient ischemic attack) [[id number]]  Admitting Physician: Imogene Burn, ERIC [[id number]]  Attending Physician: Imogene Burn, ERIC [[id number]]          B Medical/Surgery  History Past Medical History:  Diagnosis Date   Anemia    of chronic disease   Anxiety    Arthritis    "psoratic arthritis; new dx" (09/11/2016)   CAD S/P percutaneous coronary angioplasty 08/2016   mLCx 90% -> 0% (2.75 mm x 16 mm) Synergy DES PCI Ostial D1 70% (not PTCA target). Moderate ostial and mD2.  LAD tandem 50% stenoses w/ negative FFR (0.86)   Cancer (HCC)    skin cancer on leg   Chronic lower back pain    Complication of anesthesia 1980 and 1982   trouble waking up after breast biopsy, many surgeries since no problem   Depression    Dyspnea on effort - CHRONIC    partial relief of Sx post PCI of L Circumflex.   Fibrocystic breast    GERD (gastroesophageal reflux disease)    Headache    "monthly" (09/11/2016)   History of blood transfusion    "several since age 15; I've had them after most of my surgeries" (09/11/2016)   History of myocardial infarction     - Myoview [name] 2018 indicated small Inferior - Inferoseptal Infarct (vs. rest ischemia). Echo with inferoseptal hypokinesis.   Hx of skin cancer, basal cell 2013 and 2012   right inner, lower leg; several scattered around my body   Hypertension    Iron deficiency anemia    "I've had an iron infusion"   Osteoarthritis    Osteopenia    Peptic ulcer disease    H pylori  PMR (polymyalgia rheumatica) (HCC)    PONV (postoperative nausea and vomiting)    Psoriasis    TIA (transient ischemic attack) 06/03/2020   Symptoms of right arm weakness/numbness and slurred speech as well as right droopm => MRI brain/MRA head normal.  Normal echo normal carotid Dopplers.   Past Surgical History:  Procedure Laterality Date   APPENDECTOMY     BACK SURGERY     x 6    BREAST BIOPSY Left 1980 and 1982   "benign"   CARDIAC CATHETERIZATION N/A 09/11/2016   Procedure: Left Heart Cath and Coronary Angiography;  Surgeon: Marykay Lex, MD;  Location: [facility] INVASIVE CV LAB;  Service: Cardiovascular: mLCx 90% * (PCI). Ostial D1 70% (not  PTCA target). Mod ostial and mD2.  LAD tandem 50% stenoses w/ negative FFR (0.86)   CARDIAC CATHETERIZATION N/A 09/11/2016   Procedure: Intravascular Pressure Wire/FFR Study;  Surgeon: Marykay Lex, MD;  Location: [facility] INVASIVE CV LAB;  Service: Cardiovascular: --  LAD tandem 50% stenoses w/ negative FFR (0.86   CARDIAC CATHETERIZATION N/A 09/11/2016   Procedure: Coronary Stent Intervention;  Surgeon: Marykay Lex, MD;  Location: [facility] INVASIVE CV LAB;  Service: Cardiovascular: -- DES PCI mLCx 90%--0% SYNERGY DES 2.75 MM X 16 MM.   CARDIAC CATHETERIZATION  2001   non-obstructive CAD   CATARACT EXTRACTION W/ INTRAOCULAR LENS  IMPLANT, BILATERAL  July -  August 2017   DILATION AND CURETTAGE OF UTERUS  X 3   ESOPHAGOGASTRODUODENOSCOPY  03/2009   ulcer,HP, GERD stricture   EXCISIONAL HEMORRHOIDECTOMY     HIP ARTHROPLASTY Left 2010   JOINT REPLACEMENT     KNEE ARTHROSCOPY Bilateral    LAPAROSCOPIC CHOLECYSTECTOMY  2005   LUMBAR DISC SURGERY  11/1984; 1987; 1989; 2004; 2005 X 2   deg. disk lumber spine    MOHS SURGERY Right    "inside my lower leg"   NASAL SINUS SURGERY  1970s   NM MYOVIEW LTD  08/28/2016   INTERMEDIATE RISK (b/c reduced EF & ? small infarct) -- EF ~35-40%.  ? Prior Small Inferior-apical & septal infarct (w/o ischemia).  Difuse HK - worse in inferoapex.  Suggest prior infarct with no ischemia. Ischemic area correlates with echo WMA   NM MYOVIEW LTD  05/27/2021   Findings consistent with prior anteroapical infarct with fixed defect.  No peri-infarct ischemia.  Notable improvement from prior study.:   ORIF FEMUR FRACTURE Right 01/04/2020   Procedure: OPEN REDUCTION INTERNAL FIXATION (ORIF) DISTAL FEMUR FRACTURE;  Surgeon: Samson Frederic, MD;  Location: [facility] OR;  Service: Orthopedics;  Laterality: Right;   REVERSE SHOULDER ARTHROPLASTY Left 12/17/2017   Procedure: LEFT REVERSE SHOULDER ARTHROPLASTY;  Surgeon: Beverely Low, MD;  Location: [facility] OR;  Service: Orthopedics;  Laterality:  Left;   TOTAL HIP ARTHROPLASTY Right 04/04/2013   Procedure: RIGHT TOTAL HIP ARTHROPLASTY ANTERIOR APPROACH;  Surgeon: Shelda Pal, MD;  Location: [facility] ORS;  Service: Orthopedics;  Laterality: Right;   TOTAL KNEE ARTHROPLASTY Right 2009   TRANSTHORACIC ECHOCARDIOGRAM  1/'18; 1/'19   a) 1/'18: mild conc LVH. EF 45-50% - anteroseptal & inferoseptal HK. Mod AI.; b) 1/'19 - post PCI: (post PCI) --> EF normalized:  Normal LV systolic function; mild diastolic dysfunction; mild   LVH; sclerotic aortic valve with mild AI.  No RWMA   TRANSTHORACIC ECHOCARDIOGRAM  06/04/2020    for TIA: Normal LV size and function.  No or WMA.  EF 60 to 65%.  GR 1 DD with elevated LAP.  Normal RV size and function.  Normal RVP, RAP.  Mild aortic valve calcification-sclerosis but no stenosis.  Trivial AI.  No embolic source   TUBAL LIGATION     VAGINAL HYSTERECTOMY  1970s   partial      A IV Location/Drains/Wounds Patient Lines/Drains/Airways Status     Active Line/Drains/Airways     Name Placement date Placement time Site Days   Peripheral IV 12/22/22 20 G Anterior;Left Forearm 12/22/22  2040  Forearm  1            Intake/Output Last 24 hours No intake or output data in the 24 hours ending 12/23/22 0426  Labs/Imaging Results for orders placed or performed during the hospital encounter of 12/22/22 (from the past 48 hour(s))  CBC with Differential     Status: Abnormal   Collection Time: 12/22/22  9:55 PM  Result Value Ref Range   WBC 10.0 4.0 - 10.5 K/uL   RBC 3.64 (L) 3.87 - 5.11 MIL/uL   Hemoglobin 10.7 (L) 12.0 - 15.0 g/dL   HCT 40.9 (L) 81.1 - 91.4 %   MCV 91.8 80.0 - 100.0 fL   MCH 29.4 26.0 - 34.0 pg   MCHC 32.0 30.0 - 36.0 g/dL   RDW 78.2 95.6 - 21.3 %   Platelets 217 150 - 400 K/uL   nRBC 0.0 0.0 - 0.2 %   Neutrophils Relative % 73 %   Neutro Abs 7.3 1.7 - 7.7 K/uL   Lymphocytes Relative 18 %   Lymphs Abs 1.8 0.7 - 4.0 K/uL   Monocytes Relative 7 %   Monocytes Absolute 0.7 0.1 - 1.0  K/uL   Eosinophils Relative 1 %   Eosinophils Absolute 0.1 0.0 - 0.5 K/uL   Basophils Relative 1 %   Basophils Absolute 0.1 0.0 - 0.1 K/uL   Immature Granulocytes 0 %   Abs Immature Granulocytes 0.04 0.00 - 0.07 K/uL    Comment: Performed at [facility], [address] [place], [place] [address]  Lipase, blood     Status: None   Collection Time: 12/22/22  9:55 PM  Result Value Ref Range   Lipase 47 11 - 51 U/L    Comment: Performed at [facility], [address], [place], [place] [address]  Troponin I (High Sensitivity)     Status: None   Collection Time: 12/22/22  9:55 PM  Result Value Ref Range   Troponin I (High Sensitivity) 6 <18 ng/L    Comment: (NOTE) Elevated high sensitivity troponin I (hsTnI) values and significant  changes across serial measurements may suggest ACS but many other  chronic and acute conditions are known to elevate hsTnI results.  Refer to the "Links" section for chest pain algorithms and additional  guidance. Performed at [facility], [address], [place], [place] [address]   TSH     Status: None   Collection Time: 12/22/22  9:55 PM  Result Value Ref Range   TSH 2.419 0.350 - 4.500 uIU/mL    Comment: Performed by a 3rd Generation assay with a functional sensitivity of <=0.01 uIU/mL. Performed at [facility], [address] [place], [place] [address]   Ethanol     Status: None   Collection Time: 12/22/22  9:55 PM  Result Value Ref Range   Alcohol, Ethyl (B) <10 <10 mg/dL    Comment: (NOTE) Lowest detectable limit for serum alcohol is 10 mg/dL.  For medical purposes only. Performed at [facility], [address], [place], [place] [address]   Protime-INR     Status: None   Collection Time: 12/22/22  9:55 PM  Result Value Ref Range   Prothrombin Time 14.0 11.4 - 15.2 seconds   INR 1.1 0.8 - 1.2    Comment: (NOTE) INR goal varies based on device and disease states. Performed at [facility],  [address], [place], [place] [address]   APTT     Status: None   Collection Time: 12/22/22  9:55 PM  Result Value Ref Range   aPTT 27 24 - 36 seconds    Comment: Performed at [facility], [address] [place], [place] [address]  Comprehensive metabolic panel     Status: Abnormal   Collection Time: 12/22/22  9:55 PM  Result Value Ref Range   Sodium 136 135 - 145 mmol/L   Potassium 4.1 3.5 - 5.1 mmol/L   Chloride 103 98 - 111 mmol/L   CO2 24 22 - 32 mmol/L   Glucose, Bld 124 (H) 70 - 99 mg/dL    Comment: Glucose reference range applies only to samples taken after fasting for at least 8 hours.   BUN 19 8 - 23 mg/dL   Creatinine, Ser 9.14 (H) 0.44 - 1.00 mg/dL   Calcium 8.8 (L) 8.9 - 10.3 mg/dL   Total Protein 6.5 6.5 - 8.1 g/dL   Albumin 3.4 (L) 3.5 - 5.0 g/dL   AST 21 15 - 41 U/L   ALT 15 0 - 44 U/L   Alkaline Phosphatase 50 38 - 126 U/L   Total Bilirubin 0.7 0.3 - 1.2 mg/dL   GFR, Estimated 55 (L) >60 mL/min    Comment: (NOTE) Calculated using the CKD-EPI Creatinine Equation (2021)    Anion gap 9 5 - 15    Comment: Performed at [facility], [address] [place], [place] [address]  I-stat chem 8, ED     Status: Abnormal   Collection Time: 12/22/22 10:00 PM  Result Value Ref Range   Sodium 135 135 - 145 mmol/L   Potassium 4.2 3.5 - 5.1 mmol/L   Chloride 103 98 - 111 mmol/L   BUN 21 8 - 23 mg/dL   Creatinine, Ser 6.21 0.44 - 1.00 mg/dL   Glucose, Bld 308 (H) 70 - 99 mg/dL    Comment: Glucose reference range applies only to samples taken after fasting for at least 8 hours.   Calcium, Ion 1.12 (L) 1.15 - 1.40 mmol/L   TCO2 24 22 - 32 mmol/L   Hemoglobin 10.9 (L) 12.0 - 15.0 g/dL   HCT 65.7 (L) 84.6 - 96.2 %  C-reactive protein     Status: None   Collection Time: 12/22/22 11:12 PM  Result Value Ref Range   CRP <0.5 <1.0 mg/dL    Comment: Performed at [facility], [address] [place], [place] [address]  Urinalysis, Routine w reflex microscopic  -Urine, Clean Catch     Status: Abnormal   Collection Time: 12/22/22 11:56 PM  Result Value Ref Range   Color, Urine YELLOW YELLOW   APPearance CLEAR CLEAR   Specific Gravity, Urine 1.009 1.005 - 1.030   pH 6.0 5.0 - 8.0   Glucose, UA NEGATIVE NEGATIVE mg/dL   Hgb urine dipstick NEGATIVE NEGATIVE   Bilirubin Urine NEGATIVE NEGATIVE   Ketones, ur NEGATIVE NEGATIVE mg/dL   Protein, ur NEGATIVE NEGATIVE mg/dL   Nitrite POSITIVE (A) NEGATIVE  Leukocytes,Ua SMALL (A) NEGATIVE   RBC / HPF 0-5 0 - 5 RBC/hpf   WBC, UA 0-5 0 - 5 WBC/hpf   Bacteria, UA MANY (A) NONE SEEN   Squamous Epithelial / HPF 0-5 0 - 5 /HPF    Comment: Performed at [facility], [address] [place], [place] [address]  Urine rapid drug screen (hosp performed)     Status: Abnormal   Collection Time: 12/22/22 11:56 PM  Result Value Ref Range   Opiates POSITIVE (A) NONE DETECTED   Cocaine NONE DETECTED NONE DETECTED   Benzodiazepines NONE DETECTED NONE DETECTED   Amphetamines NONE DETECTED NONE DETECTED   Tetrahydrocannabinol NONE DETECTED NONE DETECTED   Barbiturates NONE DETECTED NONE DETECTED    Comment: (NOTE) DRUG SCREEN FOR MEDICAL PURPOSES ONLY.  IF CONFIRMATION IS NEEDED FOR ANY PURPOSE, NOTIFY LAB WITHIN 5 DAYS.  LOWEST DETECTABLE LIMITS FOR URINE DRUG SCREEN Drug Class                     Cutoff (ng/mL) Amphetamine and metabolites    1000 Barbiturate and metabolites    200 Benzodiazepine                 200 Opiates and metabolites        300 Cocaine and metabolites        300 THC                            50 Performed at [facility], [address] [place], [place] [address]   Sedimentation rate     Status: None   Collection Time: 12/23/22 12:00 AM  Result Value Ref Range   Sed Rate 15 0 - 22 mm/hr    Comment: Performed at [facility], [address] [place], [place] [address]   MR BRAIN WO CONTRAST  Result Date: 12/23/2022 CLINICAL DATA:  Acute neurologic deficit  EXAM: MRI HEAD WITHOUT CONTRAST TECHNIQUE: Multiplanar, multiecho pulse sequences of the brain and surrounding structures were obtained without intravenous contrast. COMPARISON:  None Available. FINDINGS: Brain: No acute infarct, mass effect or extra-axial collection. No acute or chronic hemorrhage. There is multifocal hyperintense T2-weighted signal within the white matter. Parenchymal volume and CSF spaces are normal. The midline structures are normal. Vascular: Major flow voids are preserved. Skull and upper cervical spine: Normal calvarium and skull base. Visualized upper cervical spine and soft tissues are normal. Sinuses/Orbits:No paranasal sinus fluid levels or advanced mucosal thickening. No mastoid or middle ear effusion. Normal orbits. IMPRESSION: 1. No acute intracranial abnormality. 2. Findings of chronic small vessel ischemia. Electronically Signed   By: Deatra Robinson M.D.   On: 12/23/2022 01:21   CT ANGIO HEAD NECK W WO CM  Result Date: 12/23/2022 CLINICAL DATA:  Dizziness EXAM: CT ANGIOGRAPHY HEAD AND NECK WITH AND WITHOUT CONTRAST TECHNIQUE: Multidetector CT imaging of the head and neck was performed using the standard protocol during bolus administration of intravenous contrast. Multiplanar CT image reconstructions and MIPs were obtained to evaluate the vascular anatomy. Carotid stenosis measurements (when applicable) are obtained utilizing NASCET criteria, using the distal internal carotid diameter as the denominator. RADIATION DOSE REDUCTION: This exam was performed according to the departmental dose-optimization program which includes automated exposure control, adjustment of the mA and/or kV according to patient size and/or use of iterative reconstruction technique. CONTRAST:  75mL OMNIPAQUE IOHEXOL 350 MG/ML SOLN COMPARISON:  None Available.  FINDINGS: CT HEAD FINDINGS Brain: There is no mass, hemorrhage or extra-axial collection. The size and configuration of the ventricles and extra-axial  CSF spaces are normal. There is no acute or chronic infarction. The brain parenchyma is normal. Skull: The visualized skull base, calvarium and extracranial soft tissues are normal. Sinuses/Orbits: No fluid levels or advanced mucosal thickening of the visualized paranasal sinuses. No mastoid or middle ear effusion. The orbits are normal. CTA NECK FINDINGS SKELETON: There is no bony spinal canal stenosis. No lytic or blastic lesion. OTHER NECK: Normal pharynx, larynx and major salivary glands. No cervical lymphadenopathy. Unremarkable thyroid gland. UPPER CHEST: No pneumothorax or pleural effusion. No nodules or masses. AORTIC ARCH: There is calcific atherosclerosis of the aortic arch. There is no aneurysm, dissection or hemodynamically significant stenosis of the visualized portion of the aorta. Conventional 3 vessel aortic branching pattern. The visualized proximal subclavian arteries are widely patent. RIGHT CAROTID SYSTEM: No dissection, occlusion or aneurysm. Mild atherosclerotic calcification at the carotid bifurcation without hemodynamically significant stenosis. LEFT CAROTID SYSTEM: No dissection, occlusion or aneurysm. Mild atherosclerotic calcification at the carotid bifurcation without hemodynamically significant stenosis. VERTEBRAL ARTERIES: Left dominant configuration. Both origins are clearly patent. There is no dissection, occlusion or flow-limiting stenosis to the skull base (V1-V3 segments). CTA HEAD FINDINGS POSTERIOR CIRCULATION: --Vertebral arteries: Normal V4 segments. --Inferior cerebellar arteries: Normal. --Basilar artery: Normal. --Superior cerebellar arteries: Normal. --Posterior cerebral arteries (PCA): Normal. ANTERIOR CIRCULATION: --Intracranial internal carotid arteries: Normal. --Anterior cerebral arteries (ACA): Normal. Both A1 segments are present. Patent anterior communicating artery (a-comm). --Middle cerebral arteries (MCA): Normal. VENOUS SINUSES: As permitted by contrast timing,  patent. ANATOMIC VARIANTS: None Review of the MIP images confirms the above findings. IMPRESSION: 1. No emergent large vessel occlusion or hemodynamically significant stenosis of the head or neck. 2. Mild bilateral carotid bifurcation atherosclerosis without hemodynamically significant stenosis. Aortic atherosclerosis (ICD10-I70.0). Electronically Signed   By: Deatra Robinson M.D.   On: 12/23/2022 00:32   CT HEAD CODE STROKE WO CONTRAST  Result Date: 12/22/2022 CLINICAL DATA:  Code stroke. Bilateral arm weakness and slurred speech EXAM: CT HEAD WITHOUT CONTRAST TECHNIQUE: Contiguous axial images were obtained from the base of the skull through the vertex without intravenous contrast. RADIATION DOSE REDUCTION: This exam was performed according to the departmental dose-optimization program which includes automated exposure control, adjustment of the mA and/or kV according to patient size and/or use of iterative reconstruction technique. COMPARISON:  None Available. FINDINGS: Brain: There is no mass, hemorrhage or extra-axial collection. The size and configuration of the ventricles and extra-axial CSF spaces are normal. The brain parenchyma is normal, without evidence of acute or chronic infarction. Vascular: Atherosclerotic calcification of the internal carotid arteries at the skull base. No abnormal hyperdensity of the major intracranial arteries or dural venous sinuses. Skull: The visualized skull base, calvarium and extracranial soft tissues are normal. Sinuses/Orbits: No fluid levels or advanced mucosal thickening of the visualized paranasal sinuses. No mastoid or middle ear effusion. The orbits are normal. ASPECTS [facility] Stroke Program Early CT Score) - Ganglionic level infarction (caudate, lentiform nuclei, internal capsule, insula, M1-M3 cortex): 7 - Supraganglionic infarction (M4-M6 cortex): 3 Total score (0-10 with 10 being normal): 10 IMPRESSION: 1. No acute intracranial abnormality. 2. ASPECTS is 10.  These results were communicated to Dr. Brooke Dare at 10:13 pm on 12/22/2022 by text page via the [facility] messaging system. Electronically Signed   By: Deatra Robinson M.D.   On: 12/22/2022 22:13   DG Chest Portable 1 View  Result Date: 12/22/2022 CLINICAL DATA:  Weakness EXAM: PORTABLE CHEST 1 VIEW COMPARISON:  Chest x-ray 11/04/2021 FINDINGS: The heart size and mediastinal contours are within normal limits. Both lungs are clear. Left shoulder arthroplasty is present. IMPRESSION: No active disease. Electronically Signed   By: Darliss Cheney M.D.   On: 12/22/2022 21:39    Pending Labs Unresulted Labs (From admission, onward)     Start     Ordered   12/23/22 0500  Lipid panel  (Labs)  Tomorrow morning,   R       Comments: Fasting    12/22/22 2325            Vitals/Pain Today's Vitals   12/23/22 0105 12/23/22 0130 12/23/22 0230 12/23/22 0300  BP: (!) 171/89 (!) 174/73 (!) 156/89 (!) 158/66  Pulse: 80 75 72 69  Resp: 16   17  Temp:      TempSrc:      SpO2: 100% 97% 96% 96%  PainSc:        Isolation Precautions No active isolations  Medications Medications   stroke: early stages of recovery book (has no administration in time range)  clopidogrel (PLAVIX) tablet 300 mg (300 mg Oral Given 12/23/22 0105)    Followed by  clopidogrel (PLAVIX) tablet 75 mg (has no administration in time range)  hydrALAZINE (APRESOLINE) tablet 25 mg (25 mg Oral Given 12/23/22 0105)  iohexol (OMNIPAQUE) 350 MG/ML injection 75 mL (75 mLs Intravenous Contrast Given 12/23/22 0021)    Mobility walks with person assist     Focused Assessments Neuro Assessment Handoff:  Swallow screen pass? Yes    NIH Stroke Scale  Dizziness Present: No Headache Present: No Interval: Initial Level of Consciousness (1a.)   : Alert, keenly responsive LOC Questions (1b. )   : Answers both questions correctly LOC Commands (1c. )   : Performs both tasks correctly Best Gaze (2. )  : Normal Visual (3. )  : No visual  loss Facial Palsy (4. )    : Normal symmetrical movements Motor Arm, Left (5a. )   : No drift Motor Arm, Right (5b. ) : No drift Motor Leg, Left (6a. )  : No drift Motor Leg, Right (6b. ) : No drift Limb Ataxia (7. ): Absent Sensory (8. )  : Normal, no sensory loss Best Language (9. )  : No aphasia Dysarthria (10. ): Normal Extinction/Inattention (11.)   : No Abnormality Complete NIHSS TOTAL: 0 Last date known well: 12/22/22 Last time known well: 1800 Neuro Assessment: Exceptions to WDL Neuro Checks:   Initial (12/22/22 2200)  Has TPA been given? No If patient is a Neuro Trauma and patient is going to OR before floor call report to 4N Charge nurse: [phone] or [phone]   R Recommendations: See Admitting Provider Note  Report given to:   Additional Notes:

## 2022-12-24 ENCOUNTER — Encounter: Payer: Self-pay | Admitting: Hematology and Oncology

## 2022-12-24 ENCOUNTER — Telehealth: Payer: Self-pay | Admitting: Cardiology

## 2022-12-24 ENCOUNTER — Other Ambulatory Visit (HOSPITAL_COMMUNITY): Payer: Self-pay

## 2022-12-24 DIAGNOSIS — I2510 Atherosclerotic heart disease of native coronary artery without angina pectoris: Secondary | ICD-10-CM | POA: Diagnosis not present

## 2022-12-24 DIAGNOSIS — E669 Obesity, unspecified: Secondary | ICD-10-CM | POA: Diagnosis not present

## 2022-12-24 DIAGNOSIS — R3989 Other symptoms and signs involving the genitourinary system: Secondary | ICD-10-CM

## 2022-12-24 DIAGNOSIS — Z9861 Coronary angioplasty status: Secondary | ICD-10-CM | POA: Diagnosis not present

## 2022-12-24 DIAGNOSIS — G459 Transient cerebral ischemic attack, unspecified: Secondary | ICD-10-CM | POA: Diagnosis not present

## 2022-12-24 DIAGNOSIS — I674 Hypertensive encephalopathy: Secondary | ICD-10-CM | POA: Diagnosis not present

## 2022-12-24 DIAGNOSIS — E785 Hyperlipidemia, unspecified: Secondary | ICD-10-CM | POA: Diagnosis not present

## 2022-12-24 LAB — CBC WITH DIFFERENTIAL/PLATELET
Abs Immature Granulocytes: 0.03 10*3/uL (ref 0.00–0.07)
Basophils Absolute: 0 10*3/uL (ref 0.0–0.1)
Basophils Relative: 1 %
Eosinophils Absolute: 0.1 10*3/uL (ref 0.0–0.5)
Eosinophils Relative: 2 %
HCT: 33 % — ABNORMAL LOW (ref 36.0–46.0)
Hemoglobin: 10.4 g/dL — ABNORMAL LOW (ref 12.0–15.0)
Immature Granulocytes: 0 %
Lymphocytes Relative: 25 %
Lymphs Abs: 1.7 10*3/uL (ref 0.7–4.0)
MCH: 28.7 pg (ref 26.0–34.0)
MCHC: 31.5 g/dL (ref 30.0–36.0)
MCV: 90.9 fL (ref 80.0–100.0)
Monocytes Absolute: 0.4 10*3/uL (ref 0.1–1.0)
Monocytes Relative: 6 %
Neutro Abs: 4.5 10*3/uL (ref 1.7–7.7)
Neutrophils Relative %: 66 %
Platelets: 225 10*3/uL (ref 150–400)
RBC: 3.63 MIL/uL — ABNORMAL LOW (ref 3.87–5.11)
RDW: 12.5 % (ref 11.5–15.5)
WBC: 6.8 10*3/uL (ref 4.0–10.5)
nRBC: 0 % (ref 0.0–0.2)

## 2022-12-24 LAB — MAGNESIUM: Magnesium: 2.1 mg/dL (ref 1.7–2.4)

## 2022-12-24 LAB — FOLATE: Folate: 7.6 ng/mL (ref >5.9)

## 2022-12-24 LAB — RETICULOCYTES
Immature Retic Fract: 5.8 % (ref 2.3–15.9)
RBC.: 3.58 MIL/uL — ABNORMAL LOW (ref 3.87–5.11)
Retic Count, Absolute: 46.9 10*3/uL (ref 19.0–186.0)
Retic Ct Pct: 1.3 % (ref 0.4–3.1)

## 2022-12-24 LAB — COMPREHENSIVE METABOLIC PANEL
ALT: 14 U/L (ref 0–44)
AST: 18 U/L (ref 15–41)
Albumin: 3.2 g/dL — ABNORMAL LOW (ref 3.5–5.0)
Alkaline Phosphatase: 48 U/L (ref 38–126)
Anion gap: 8 (ref 5–15)
BUN: 14 mg/dL (ref 8–23)
CO2: 27 mmol/L (ref 22–32)
Calcium: 9.2 mg/dL (ref 8.9–10.3)
Chloride: 103 mmol/L (ref 98–111)
Creatinine, Ser: 0.93 mg/dL (ref 0.44–1.00)
GFR, Estimated: >60 mL/min (ref >60)
Glucose, Bld: 103 mg/dL — ABNORMAL HIGH (ref 70–99)
Potassium: 4.3 mmol/L (ref 3.5–5.1)
Sodium: 138 mmol/L (ref 135–145)
Total Bilirubin: 0.6 mg/dL (ref 0.3–1.2)
Total Protein: 6.2 g/dL — ABNORMAL LOW (ref 6.5–8.1)

## 2022-12-24 LAB — IRON AND TIBC
Iron: 64 ug/dL (ref 28–170)
Saturation Ratios: 21 % (ref 10.4–31.8)
TIBC: 305 ug/dL (ref 250–450)
UIBC: 241 ug/dL

## 2022-12-24 LAB — FERRITIN: Ferritin: 62 ng/mL (ref 11–307)

## 2022-12-24 LAB — PHOSPHORUS: Phosphorus: 4.4 mg/dL (ref 2.5–4.6)

## 2022-12-24 LAB — VITAMIN B12: Vitamin B-12: 306 pg/mL (ref 180–914)

## 2022-12-24 MED ORDER — METOPROLOL TARTRATE 25 MG PO TABS
37.5000 mg | ORAL_TABLET | Freq: Two times a day (BID) | ORAL | 0 refills | Status: DC
  Filled 2022-12-24: qty 90, 30d supply, fill #0
  Filled 2022-12-24: qty 75, 25d supply, fill #0

## 2022-12-24 MED ORDER — ASPIRIN 81 MG PO TBEC
81.0000 mg | DELAYED_RELEASE_TABLET | Freq: Every day | ORAL | 0 refills | Status: AC
  Filled 2022-12-24: qty 21, 21d supply, fill #0

## 2022-12-24 MED ORDER — CEFDINIR 300 MG PO CAPS
300.0000 mg | ORAL_CAPSULE | Freq: Two times a day (BID) | ORAL | 0 refills | Status: AC
  Filled 2022-12-24: qty 6, 3d supply, fill #0

## 2022-12-24 MED ORDER — LOSARTAN POTASSIUM 50 MG PO TABS
50.0000 mg | ORAL_TABLET | Freq: Every day | ORAL | 0 refills | Status: DC
  Filled 2022-12-24: qty 30, 30d supply, fill #0

## 2022-12-24 MED ORDER — CLOPIDOGREL BISULFATE 75 MG PO TABS
75.0000 mg | ORAL_TABLET | Freq: Every day | ORAL | 1 refills | Status: DC
  Filled 2022-12-24: qty 30, 30d supply, fill #0

## 2022-12-24 NOTE — Progress Notes (Signed)
Pt discharged at this time.  Has all belongings with her.  Patient and daughter verbalize understanding of all discharge instructions and follow-up appointments.

## 2022-12-24 NOTE — Telephone Encounter (Signed)
Pt stated she was admitted in the hospital recently and stated when she was released there had been some medication adjustments made. She would like for Dr. Herbie Baltimore to look over the changes and to let her know if he's ok with the changes. She stated she'd feel more comfortable about the changes. Pt is requesting a callback. Please advise.

## 2022-12-24 NOTE — TOC Transition Note (Signed)
Transition of Care [facility]) - CM/SW Discharge Note   Patient Details  Name: [name] [name] MRN: [id number] Date of Birth: [birth year]  Transition of Care [facility] CM/SW Contact:  Kermit Balo, RN Phone Number: 12/24/2022, 10:54 AM   Clinical Narrative:    Pt is discharging home with home health services through [facility]. Information on the AVS.  Pt's discharge medications to be delivered to the room per [facility] pharmacy.  Pt has transportation home.   Final next level of care: Home w Home Health Services Barriers to Discharge: No Barriers Identified   Patient Goals and CMS Choice CMS Medicare.gov Compare Post Acute Care list provided to:: Patient Choice offered to / list presented to : Patient  Discharge Placement                         Discharge Plan and Services Additional resources added to the After Visit Summary for                            [facility] Arranged: PT, OT [facility] Agency: [facility] Date [facility] Agency Contacted: 12/23/22   Representative spoke with at [facility] Agency: Kandee Keen  Social Determinants of Health (SDOH) Interventions SDOH Screenings   Food Insecurity: No Food Insecurity (12/23/2022)  Housing: Low Risk  (12/23/2022)  Transportation Needs: No Transportation Needs (12/23/2022)  Utilities: Not At Risk (12/23/2022)  Alcohol Screen: Low Risk  (12/09/2022)  Depression (PHQ2-9): Low Risk  (12/09/2022)  Financial Resource Strain: Low Risk  (12/09/2022)  Physical Activity: Inactive (12/09/2022)  Social Connections: Moderately Isolated (12/09/2022)  Stress: No Stress Concern Present (12/09/2022)  Tobacco Use: Low Risk  (12/23/2022)     Readmission Risk Interventions     No data to display

## 2022-12-26 NOTE — Telephone Encounter (Signed)
Subtle changes seem to be okay.  We had discussed possibly increasing losartan to 50 mg.  Not sure the benefit of increasing the beta-blocker dose but it will not hurt.  Just need to make sure the blood pressures are not getting too low.  The aspirin and Plavix plan is from neurology and I agree with that.  Bryan Lemma, MD

## 2022-12-26 NOTE — Discharge Summary (Signed)
Physician Discharge Summary   Patient: [name] [name] MRN: [id number] DOB: 04/21/1941  Admit date:     12/22/2022  Discharge date: 12/24/2022  Discharge Physician: Marguerita Merles, DO   PCP: Judy Pimple, MD   Recommendations at discharge:   Follow-up with PCP within 1 to 2 weeks repeat CBC, CMP, mag, Phos within 1 week Follow-up with neurology outpatient setting continue dual antiplatelet therapy for 3 weeks and then just Plavix alone  Discharge Diagnoses: Principal Problem:   TIA (transient ischemic attack) Active Problems:   Hyperlipidemia with target low density lipoprotein (LDL) cholesterol less than 70 mg/dL   Essential hypertension   Obesity (BMI 30-39.9)   CAD S/P percutaneous coronary angioplasty   Asymptomatic bacteriuria  Resolved Problems:   * No resolved hospital problems. [facility] Course: The patient is an elderly Caucasian female with a past medical history significant for but limited to CAD status post stenting, hyperlipoidemia, hypertension, obesity, chronic pain syndrome on hydrocodone as well as other comorbidities presented to the ED with sudden weakness around 7:30 PM last night.  She had a primary care visit yesterday and had a cardiology follow-up on Friday.  She went out to lunch with her daughter and another relative and then the daughter proceeded about a minute.  They got home around 6 PM and around 7:30 PM the patient developed sudden acute generalized weakness with no slurred speech no focality and she was brought to the ED for further evaluation.  Patient's daughter noted that patient seemed "off" and she is found to be in a recliner feeling nauseated and faint and per report she had difficulty with her speech and began to speak very slowly and having difficulty getting her words out.  She came to the ED and a code stroke was activated and neurology was consulted.  She is currently undergoing stroke and TIA workup and neurology is following.  MRI of the brain  has been done and showed no acute intracranial abnormalities but did show findings of chronic small vessel ischemia.  CT of the head done and showed "No emergent large vessel occlusion or hemodynamically significant stenosis of the head or neck. Mild bilateral carotid bifurcation atherosclerosis without hemodynamically significant stenosis"   Currently undergoing workup and echocardiogram also done and showed an EF of 60 to 65%.  Neurology has been consulted now recommending dual antiplatelet therapy for at least 3 weeks with just Plavix alone afterwards however formal note is still pending.  She has been loaded with Plavix already.  PT OT recommending home health and patient feels weak.  Urinalysis was positive for small leukocytes and positive for nitrites with many bacteria.  Initially thought to have asymptomatic bacteriuria however cannot effectively rule out UTI given her symptoms so we will initiate IV ceftriaxone continue antibiotics for 3 days total.  She will further care per neurology and PT OT recommending home health.    Assessment and Plan:  Suspected TIA (transient ischemic attack) versus hypertensive encephalopathy versus medication effect. -Observation med/tele bed and unclear etiology of her encephalopathy but she is improved back to her baseline.  Neurology cannot effectively rule out a TIA so there is no change her medications and will -MRI brain completed and showed no acute intracranial abnormalities but did show chronic small vessel ischemic changes.  -Permissive HTN for now per neuro consult note. Keep BP <220/110.  -Check echo. Check lipid panel.  -Started on plavix. Continue asa.  Now neurology recommending dual antiplatelet therapy for 3 weeks  and then just Plavix alone -Further care per Neurology and echocardiogram done and showed EF of 60 to 65%.  CT head and neck unremarkable and CT scan showed no abnormality. -Patient has a had a history of a TIA in 06/13/2020 which is  admitted for transient speech difficulty -PT OT recommending home health and she is stable for discharge   CAD S/P percutaneous coronary angioplasty -Stable. On lipitor and asa, lopressor 25 mg bid. Now adding Plavix per Neuro and they are recommending dual antiplatelet therapy for 3 weeks and then just Plavix alone   Essential Hypertension -Allow for permissive htn per neuro consult.  -At home, pt on losartan 25 mg qd and lopressor 25 mg bid. Recently saw cardiology this past week.  -I increased losartan to 50 mg p.o. daily and increase metoprolol to 37.5 mg twice daily -Follow-up with PCP and continue with blood pressure monitoring   Hyperlipidemia with target low density lipoprotein (LDL) cholesterol less than 70 mg/dL -Continue  lipitor 40 mg -Lipid panel done and showed a total cholesterol/HDL ratio 3.5, cholesterol level 149, HDL 42, LDL 76, triglycerides 154, VLDL 31   Suspected UTI -Patient denied any dysuria.   -Patient chronically has urinary frequency due to urinary incontinence.  -She was confused yesterday however -UA with + Small Leukocytes, Positive Nitrites and Many Bacteria -Urine Cx not done so will order -Trial of IV Ceftriaxone for 3 Days will change to p.o. antibiotics for discharge -WBC trending up: Recent Labs  Lab 12/17/22 0828 12/22/22 2155 12/23/22 0436 12/24/22 0704  WBC 7.3 10.0 11.8* 6.8  -Follow up PCP within 1-2 weeks    Normocytic Anemia -Hgb/Hct Trend: Recent Labs  Lab 12/17/22 0828 12/22/22 2155 12/22/22 2200 12/23/22 0436 12/24/22 0704  HGB 11.0* 10.7* 10.9* 10.9* 10.4*  HCT 33.6* 33.4* 32.0* 33.4* 33.0*  MCV 89.7 91.8  --  90.0 90.9  -Checked Anemia Panel showed an iron level of 64, UIBC 241, TIBC 305, saturation ratios of 21%, ferritin level 62, folate level 7.6, vitamin B12 level 306 -Continue to Monitor for S/Sx of Bleeding; no overt bleeding noted -Repeat CBC within 1 week   Hypoalbuminemia -Patient's Albumin Trend: Recent  Labs  Lab 12/17/22 0828 12/22/22 2155 12/23/22 0436 12/24/22 0704  ALBUMIN 3.9 3.4* 3.3* 3.2*  -Continue to Monitor and Trend and repeat CMP within 1 week   Obesity -Complicates overall prognosis and care -Estimated body mass index is 30.9 kg/m as calculated from the following:   Height as of this encounter: 5' 2.75" (1.594 m).   Weight as of this encounter: 78.5 kg.  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   Ace Inhibitors    cough   Hydrochlorothiazide    Hyponatremia   Oxaprozin    GI upset    Pregabalin Swelling   SWELLING REACTION UNSPECIFIED    Amoxicillin-pot Clavulanate Nausea And Vomiting   gi upset Has patient had a PCN reaction causing immediate rash, facial/tongue/throat swelling, SOB or  lightheadedness with hypotension: No Has patient had a PCN reaction causing severe rash involving mucus membranes or skin necrosis: No Has patient had a PCN reaction that required hospitalization No Has patient had a PCN reaction occurring within the last 10 years: No If all of the above answers are "NO", then may proceed with Cephalosporin use.        Medication List     TAKE these medications    acetaminophen 500 MG tablet Commonly known as: TYLENOL Take 1,000 mg by mouth 2 (two) times daily as needed for moderate pain or headache.   albuterol 108 (90 Base) MCG/ACT inhaler Commonly known as: VENTOLIN HFA INHALE 1 PUFF INTO THE LUNGS EVERY 6 (SIX) HOURS AS NEEDED (FOR COUGH).   Aspirin Low Dose 81 MG tablet Generic  drug: aspirin EC Take 1 tablet (81 mg total) by mouth daily for 21 days. Swallow whole. What changed: additional instructions   atorvastatin 40 MG tablet Commonly known as: LIPITOR Take 1 tablet (40 mg total) by mouth daily.   benzonatate 200 MG capsule Commonly known as: TESSALON TAKE 1 CAPSULE (200 MG TOTAL) BY MOUTH 3 (THREE) TIMES DAILY AS NEEDED FOR COUGH.   busPIRone 15 MG tablet Commonly known as: BUSPAR TAKE 1 TABLET BY MOUTH EVERY DAY   cefdinir 300 MG capsule Commonly known as: OMNICEF Take 1 capsule (300 mg total) by mouth 2 (two) times daily for 3 days.   cetirizine 10 MG tablet Commonly known as: ZYRTEC TAKE 1 TABLET BY MOUTH EVERY DAY   clopidogrel 75 MG tablet Commonly known as: PLAVIX Take 1 tablet (75 mg total) by mouth daily.   fluticasone 50 MCG/ACT nasal spray Commonly known as: FLONASE PLACE 1 SPRAY INTO BOTH NOSTRILS DAILY AS NEEDED. What changed: reasons to take this   HYDROcodone-acetaminophen 5-325 MG tablet Commonly known as: NORCO/VICODIN Take 1 tablet by mouth 3 (three) times daily as needed for moderate pain.   losartan 50 MG tablet Commonly known as: COZAAR Take 1 tablet (50 mg total) by mouth daily. What changed:  medication strength how much to take   Melatonin 3 MG Caps Take 6 mg by mouth at bedtime.   methocarbamol 500 MG tablet Commonly known as: ROBAXIN TAKE 1 TABLET BY MOUTH EVERY 8 HOURS AS NEEDED FOR MUSCLE SPASM What changed: See the new instructions.   metoprolol tartrate 25 MG tablet Commonly known as: LOPRESSOR Take 1.5 tablets (37.5 mg total) by mouth 2 (two) times daily. What changed: how much to take   nitroGLYCERIN 0.4 MG SL tablet Commonly known as: NITROSTAT Place 1 tablet (0.4 mg total) under the tongue every 5 (five) minutes as needed.   nystatin powder Commonly known as: MYCOSTATIN/NYSTOP Apply topically 2 (two) times daily as needed. To affected areas to prevent yeast (in skin folds)   pantoprazole  40 MG tablet Commonly known as: PROTONIX TAKE 1 TABLET BY MOUTH EVERY DAY   sertraline 25 MG tablet Commonly known as: ZOLOFT TAKE 1 TABLET (25 MG TOTAL) BY MOUTH DAILY.   Vitamin D (Cholecalciferol) 25 MCG (1000 UT) Caps Take 2,000 Units by mouth in the morning and at bedtime.        Follow-up Information     Connect with your PCP/Specialist as discussed. Schedule an appointment as soon as possible for a visit .   Contact information: [email or web address] Call our physician referral line at [phone].        [facility], [facility] Follow up.   Specialty: Home Health Services  Why: The home health agency will contact you for the first home visit. Contact information: [address] [place] [address] [phone]         [facility]. Schedule an appointment as soon as possible for a visit in 1 month(s).   Specialty: Neurology Why: stroke clinic Contact information: [address] [place] [address] [phone]               Discharge Exam: Ceasar Mons Weights   12/23/22 0900  Weight: 78.5 kg   Vitals:   12/24/22 0736 12/24/22 1100  BP: (!) 174/67 (!) 174/62  Pulse: 64 64  Resp: 18 18  Temp: 98.3 F (36.8 C) 98.4 F (36.9 C)  SpO2: 97% 97%   Examination: Physical Exam:  Constitutional: WN/WD elderly Caucasian female in no acute distress appears calm Respiratory: Diminished to auscultation bilaterally, no wheezing, rales, rhonchi or crackles. Normal respiratory effort and patient is not tachypenic. No accessory muscle use.  Unlabored breathing Cardiovascular: RRR, no murmurs / rubs / gallops. S1 and S2 auscultated.  Minimal extremity edema Abdomen: Soft, non-tender, distended secondary to body habitus. Bowel sounds positive.  GU: Deferred. Musculoskeletal: No clubbing / cyanosis of digits/nails. No joint deformity upper and lower extremities.  Skin: No  rashes, lesions, ulcers limited skin evaluation. No induration; Warm and dry.  Neurologic: CN 2-12 grossly intact with no focal deficits. Romberg sign and cerebellar reflexes not assessed.  Psychiatric: Normal judgment and insight. Alert and oriented x 3. Normal mood and appropriate affect.   Condition at discharge: stable  The results of significant diagnostics from this hospitalization (including imaging, microbiology, ancillary and laboratory) are listed below for reference.   Imaging Studies: ECHOCARDIOGRAM COMPLETE  Result Date: 12/23/2022    ECHOCARDIOGRAM REPORT   Patient Name:   RAYETTA TALLUTO Date of Exam: 12/23/2022 Medical Rec #:  [id number]       Height:       62.8 in Accession #:    [id number]      Weight:       173.0 lb Date of Birth:  1941-08-02        BSA:          1.813 m Patient Age:    82 years        BP:           211/93 mmHg Patient Gender: F               HR:           77 bpm. Exam Location:  Inpatient Procedure: 2D Echo, Cardiac Doppler and Color Doppler Indications:    TIA  History:        Patient has prior history of Echocardiogram examinations, most                 recent 06/04/2020. CAD; Risk Factors:Hypertension, Diabetes and                 HLD.  Sonographer:    Lucy Antigua Referring Phys: [phone] ERIC CHEN IMPRESSIONS  1. Left ventricular ejection fraction, by estimation, is 60 to 65%. The left ventricle has normal function. Left ventricular endocardial border not optimally defined to evaluate regional wall motion. There is mild concentric left ventricular hypertrophy. Left ventricular diastolic parameters are consistent with Grade I diastolic dysfunction (impaired relaxation).  2. Right ventricular systolic function is hyperdynamic. The right ventricular size is normal.  3. A small pericardial effusion is  present. The pericardial effusion is circumferential.  4. The mitral valve was not well visualized. No evidence of mitral valve regurgitation.  5. The aortic valve is tricuspid.  There is mild calcification of the aortic valve. There is mild thickening of the aortic valve. Aortic valve regurgitation is trivial.  6. The inferior vena cava is normal in size with greater than 50% respiratory variability, suggesting right atrial pressure of 3 mmHg. Comparison(s): No significant change from prior study. FINDINGS  Left Ventricle: Left ventricular ejection fraction, by estimation, is 60 to 65%. The left ventricle has normal function. Left ventricular endocardial border not optimally defined to evaluate regional wall motion. The left ventricular internal cavity size was normal in size. There is mild concentric left ventricular hypertrophy. Left ventricular diastolic parameters are consistent with Grade I diastolic dysfunction (impaired relaxation). Right Ventricle: The right ventricular size is normal. No increase in right ventricular wall thickness. Right ventricular systolic function is hyperdynamic. Left Atrium: Left atrial size was not well visualized. Right Atrium: Right atrial size was not well visualized. Pericardium: A small pericardial effusion is present. The pericardial effusion is circumferential. Mitral Valve: The mitral valve was not well visualized. No evidence of mitral valve regurgitation. Tricuspid Valve: The tricuspid valve is normal in structure. Tricuspid valve regurgitation is not demonstrated. Aortic Valve: The aortic valve is tricuspid. There is mild calcification of the aortic valve. There is mild thickening of the aortic valve. There is mild aortic valve annular calcification. Aortic valve regurgitation is trivial. Aortic valve mean gradient measures 5.0 mmHg. Aortic valve peak gradient measures 10.0 mmHg. Aortic valve area, by VTI measures 1.54 cm. Pulmonic Valve: The pulmonic valve was normal in structure. Pulmonic valve regurgitation is trivial. No evidence of pulmonic stenosis. Aorta: The aortic root and ascending aorta are structurally normal, with no evidence of  dilitation. Venous: The inferior vena cava is normal in size with greater than 50% respiratory variability, suggesting right atrial pressure of 3 mmHg. IAS/Shunts: The interatrial septum was not well visualized.  LEFT VENTRICLE PLAX 2D LVIDd:         4.00 cm   Diastology LVIDs:         2.60 cm   LV e' medial:    5.44 cm/s LV PW:         1.10 cm   LV E/e' medial:  10.6 LV IVS:        1.20 cm   LV e' lateral:   5.33 cm/s LVOT diam:     1.80 cm   LV E/e' lateral: 10.8 LV SV:         45 LV SV Index:   25 LVOT Area:     2.54 cm  RIGHT VENTRICLE RV S prime:     13.90 cm/s AORTIC VALVE AV Area (Vmax):    1.67 cm AV Area (Vmean):   1.61 cm AV Area (VTI):     1.54 cm AV Vmax:           158.00 cm/s AV Vmean:          106.000 cm/s AV VTI:            0.290 m AV Peak Grad:      10.0 mmHg AV Mean Grad:      5.0 mmHg LVOT Vmax:         104.00 cm/s LVOT Vmean:        67.200 cm/s LVOT VTI:          0.175 m LVOT/AV  VTI ratio: 0.60  AORTA Ao Root diam: 2.80 cm Ao Asc diam:  3.10 cm MITRAL VALVE                TRICUSPID VALVE MV Area (PHT): 3.86 cm     TR Peak grad:   11.3 mmHg MV E velocity: 57.50 cm/s   TR Vmax:        168.00 cm/s MV A velocity: 105.00 cm/s MV E/A ratio:  0.55         SHUNTS                             Systemic VTI:  0.18 m                             Systemic Diam: 1.80 cm Riley Lam MD Electronically signed by Riley Lam MD Signature Date/Time: 12/23/2022/12:01:22 PM    Final    MR BRAIN WO CONTRAST  Result Date: 12/23/2022 CLINICAL DATA:  Acute neurologic deficit EXAM: MRI HEAD WITHOUT CONTRAST TECHNIQUE: Multiplanar, multiecho pulse sequences of the brain and surrounding structures were obtained without intravenous contrast. COMPARISON:  None Available. FINDINGS: Brain: No acute infarct, mass effect or extra-axial collection. No acute or chronic hemorrhage. There is multifocal hyperintense T2-weighted signal within the white matter. Parenchymal volume and CSF spaces are normal. The  midline structures are normal. Vascular: Major flow voids are preserved. Skull and upper cervical spine: Normal calvarium and skull base. Visualized upper cervical spine and soft tissues are normal. Sinuses/Orbits:No paranasal sinus fluid levels or advanced mucosal thickening. No mastoid or middle ear effusion. Normal orbits. IMPRESSION: 1. No acute intracranial abnormality. 2. Findings of chronic small vessel ischemia. Electronically Signed   By: Deatra Robinson M.D.   On: 12/23/2022 01:21   CT ANGIO HEAD NECK W WO CM  Result Date: 12/23/2022 CLINICAL DATA:  Dizziness EXAM: CT ANGIOGRAPHY HEAD AND NECK WITH AND WITHOUT CONTRAST TECHNIQUE: Multidetector CT imaging of the head and neck was performed using the standard protocol during bolus administration of intravenous contrast. Multiplanar CT image reconstructions and MIPs were obtained to evaluate the vascular anatomy. Carotid stenosis measurements (when applicable) are obtained utilizing NASCET criteria, using the distal internal carotid diameter as the denominator. RADIATION DOSE REDUCTION: This exam was performed according to the departmental dose-optimization program which includes automated exposure control, adjustment of the mA and/or kV according to patient size and/or use of iterative reconstruction technique. CONTRAST:  75mL OMNIPAQUE IOHEXOL 350 MG/ML SOLN COMPARISON:  None Available. FINDINGS: CT HEAD FINDINGS Brain: There is no mass, hemorrhage or extra-axial collection. The size and configuration of the ventricles and extra-axial CSF spaces are normal. There is no acute or chronic infarction. The brain parenchyma is normal. Skull: The visualized skull base, calvarium and extracranial soft tissues are normal. Sinuses/Orbits: No fluid levels or advanced mucosal thickening of the visualized paranasal sinuses. No mastoid or middle ear effusion. The orbits are normal. CTA NECK FINDINGS SKELETON: There is no bony spinal canal stenosis. No lytic or blastic  lesion. OTHER NECK: Normal pharynx, larynx and major salivary glands. No cervical lymphadenopathy. Unremarkable thyroid gland. UPPER CHEST: No pneumothorax or pleural effusion. No nodules or masses. AORTIC ARCH: There is calcific atherosclerosis of the aortic arch. There is no aneurysm, dissection or hemodynamically significant stenosis of the visualized portion of the aorta. Conventional 3 vessel aortic branching pattern. The visualized proximal subclavian arteries are widely  patent. RIGHT CAROTID SYSTEM: No dissection, occlusion or aneurysm. Mild atherosclerotic calcification at the carotid bifurcation without hemodynamically significant stenosis. LEFT CAROTID SYSTEM: No dissection, occlusion or aneurysm. Mild atherosclerotic calcification at the carotid bifurcation without hemodynamically significant stenosis. VERTEBRAL ARTERIES: Left dominant configuration. Both origins are clearly patent. There is no dissection, occlusion or flow-limiting stenosis to the skull base (V1-V3 segments). CTA HEAD FINDINGS POSTERIOR CIRCULATION: --Vertebral arteries: Normal V4 segments. --Inferior cerebellar arteries: Normal. --Basilar artery: Normal. --Superior cerebellar arteries: Normal. --Posterior cerebral arteries (PCA): Normal. ANTERIOR CIRCULATION: --Intracranial internal carotid arteries: Normal. --Anterior cerebral arteries (ACA): Normal. Both A1 segments are present. Patent anterior communicating artery (a-comm). --Middle cerebral arteries (MCA): Normal. VENOUS SINUSES: As permitted by contrast timing, patent. ANATOMIC VARIANTS: None Review of the MIP images confirms the above findings. IMPRESSION: 1. No emergent large vessel occlusion or hemodynamically significant stenosis of the head or neck. 2. Mild bilateral carotid bifurcation atherosclerosis without hemodynamically significant stenosis. Aortic atherosclerosis (ICD10-I70.0). Electronically Signed   By: Deatra Robinson M.D.   On: 12/23/2022 00:32   CT HEAD CODE STROKE  WO CONTRAST  Result Date: 12/22/2022 CLINICAL DATA:  Code stroke. Bilateral arm weakness and slurred speech EXAM: CT HEAD WITHOUT CONTRAST TECHNIQUE: Contiguous axial images were obtained from the base of the skull through the vertex without intravenous contrast. RADIATION DOSE REDUCTION: This exam was performed according to the departmental dose-optimization program which includes automated exposure control, adjustment of the mA and/or kV according to patient size and/or use of iterative reconstruction technique. COMPARISON:  None Available. FINDINGS: Brain: There is no mass, hemorrhage or extra-axial collection. The size and configuration of the ventricles and extra-axial CSF spaces are normal. The brain parenchyma is normal, without evidence of acute or chronic infarction. Vascular: Atherosclerotic calcification of the internal carotid arteries at the skull base. No abnormal hyperdensity of the major intracranial arteries or dural venous sinuses. Skull: The visualized skull base, calvarium and extracranial soft tissues are normal. Sinuses/Orbits: No fluid levels or advanced mucosal thickening of the visualized paranasal sinuses. No mastoid or middle ear effusion. The orbits are normal. ASPECTS [facility] Stroke Program Early CT Score) - Ganglionic level infarction (caudate, lentiform nuclei, internal capsule, insula, M1-M3 cortex): 7 - Supraganglionic infarction (M4-M6 cortex): 3 Total score (0-10 with 10 being normal): 10 IMPRESSION: 1. No acute intracranial abnormality. 2. ASPECTS is 10. These results were communicated to Dr. Brooke Dare at 10:13 pm on 12/22/2022 by text page via the [facility] messaging system. Electronically Signed   By: Deatra Robinson M.D.   On: 12/22/2022 22:13   DG Chest Portable 1 View  Result Date: 12/22/2022 CLINICAL DATA:  Weakness EXAM: PORTABLE CHEST 1 VIEW COMPARISON:  Chest x-ray 11/04/2021 FINDINGS: The heart size and mediastinal contours are within normal limits. Both lungs are  clear. Left shoulder arthroplasty is present. IMPRESSION: No active disease. Electronically Signed   By: Darliss Cheney M.D.   On: 12/22/2022 21:39    Microbiology: Results for orders placed or performed during the hospital encounter of 06/03/20  Respiratory Panel by RT PCR (Flu A&B, Covid) - Nasopharyngeal Swab     Status: None   Collection Time: 06/04/20  4:48 AM   Specimen: Nasopharyngeal Swab  Result Value Ref Range Status   SARS Coronavirus 2 by RT PCR NEGATIVE NEGATIVE Final    Comment: (NOTE) SARS-CoV-2 target nucleic acids are NOT DETECTED.  The SARS-CoV-2 RNA is generally detectable in upper respiratoy specimens during the acute phase of infection. The lowest concentration of SARS-CoV-2 viral  copies this assay can detect is 131 copies/mL. A negative result does not preclude SARS-Cov-2 infection and should not be used as the sole basis for treatment or other patient management decisions. A negative result may occur with  improper specimen collection/handling, submission of specimen other than nasopharyngeal swab, presence of viral mutation(s) within the areas targeted by this assay, and inadequate number of viral copies (<131 copies/mL). A negative result must be combined with clinical observations, patient history, and epidemiological information. The expected result is Negative.  Fact Sheet for Patients:  https://www.moore.com/  Fact Sheet for Healthcare Providers:  https://www.young.biz/  This test is no t yet approved or cleared by the [place] FDA and  has been authorized for detection and/or diagnosis of SARS-CoV-2 by FDA under an Emergency Use Authorization (EUA). This EUA will remain  in effect (meaning this test can be used) for the duration of the COVID-19 declaration under Section 564(b)(1) of the Act, 21 U.S.C. section 360bbb-3(b)(1), unless the authorization is terminated or revoked sooner.     Influenza A by PCR  NEGATIVE NEGATIVE Final   Influenza B by PCR NEGATIVE NEGATIVE Final    Comment: (NOTE) The Xpert Xpress SARS-CoV-2/FLU/RSV assay is intended as an aid in  the diagnosis of influenza from Nasopharyngeal swab specimens and  should not be used as a sole basis for treatment. Nasal washings and  aspirates are unacceptable for Xpert Xpress SARS-CoV-2/FLU/RSV  testing.  Fact Sheet for Patients: https://www.moore.com/  Fact Sheet for Healthcare Providers: https://www.young.biz/  This test is not yet approved or cleared by the [place] FDA and  has been authorized for detection and/or diagnosis of SARS-CoV-2 by  FDA under an Emergency Use Authorization (EUA). This EUA will remain  in effect (meaning this test can be used) for the duration of the  Covid-19 declaration under Section 564(b)(1) of the Act, 21  U.S.C. section 360bbb-3(b)(1), unless the authorization is  terminated or revoked. Performed at [facility], [address] [place], [place] [address]     Labs: CBC: Recent Labs  Lab 12/22/22 2155 12/22/22 2200 12/23/22 0436 12/24/22 0704  WBC 10.0  --  11.8* 6.8  NEUTROABS 7.3  --  8.7* 4.5  HGB 10.7* 10.9* 10.9* 10.4*  HCT 33.4* 32.0* 33.4* 33.0*  MCV 91.8  --  90.0 90.9  PLT 217  --  219 225   Basic Metabolic Panel: Recent Labs  Lab 12/22/22 2155 12/22/22 2200 12/23/22 0436 12/24/22 0704  NA 136 135 136 138  K 4.1 4.2 3.8 4.3  CL 103 103 104 103  CO2 24  --  22 27  GLUCOSE 124* 118* 114* 103*  BUN 19 21 15 14   CREATININE 1.02* 0.90 0.94 0.93  CALCIUM 8.8*  --  8.7* 9.2  MG  --   --  1.8 2.1  PHOS  --   --  3.7 4.4   Liver Function Tests: Recent Labs  Lab 12/22/22 2155 12/23/22 0436 12/24/22 0704  AST 21 20 18   ALT 15 14 14   ALKPHOS 50 51 48  BILITOT 0.7 0.6 0.6  PROT 6.5 6.2* 6.2*  ALBUMIN 3.4* 3.3* 3.2*   CBG: No results for input(s): "GLUCAP" in the last 168 hours.  Discharge time spent:  greater than 30 minutes.  Signed: Marguerita Merles, DO [facility] 12/26/2022

## 2022-12-29 MED ORDER — LOSARTAN POTASSIUM 50 MG PO TABS: 50.0000 mg | ORAL_TABLET | Freq: Every day | ORAL | 6 refills | Status: DC

## 2022-12-29 NOTE — Telephone Encounter (Signed)
Pt/dAUGHTER informed of providers result & recommendations. Pt verbalized understanding. All questions, if any, were answered. Pt will take the extra 25mg  if BP>140/90 will continue the 25mg  will keep track of BP before taking extra 25mg 

## 2022-12-30 ENCOUNTER — Encounter: Payer: Self-pay | Admitting: Hematology and Oncology

## 2022-12-31 ENCOUNTER — Other Ambulatory Visit: Payer: Self-pay | Admitting: Family Medicine

## 2022-12-31 NOTE — Telephone Encounter (Signed)
Call to patient daughter as doctor still in clinic.  Until recommendations received, she will do the following. Give Losartan 50mg  I the morning.  She will take B{ before meds and 1 hour after meds and keep log.  If her systolic is less than 100, she will only give 25mg .  Agreed this may not be an issue as she has been running higher but as a safeguard since she has been taking higher doses.  Will speak with her again once recommendation/clarification received from provider

## 2022-12-31 NOTE — Telephone Encounter (Signed)
Patient daughter called regarding Losartan medication dosage.  She states that they are confused and questioning dosage.  She understood that patient should take 50 mg daily and if BP elevated, then to take another 25 mg tablet. Keep in mind they are taking BP 30 minutes after 50 mg dose and seeing it is elevated, then adding another 25 mg. Advised to take BP before medication and again 1 hour after medication to get a better reading as takes a little while for BP medication to work in the system.  Also that I will clarify as it looks that the total dosage is to be 50 mg total.   Readings: May 7 8:12pm 181/92 This is the day received the BP cuff.  She had taken 50mg  that morning.  With the BP elevated, at this time she took 25mg  as well. May 8 9:34 151/69 (30 minutes after 50mg  dose) She then took additional 25mg  tablet May 9 9:41 am 148/82 Took 50mg  tablet.  She has NOT taken any further medication as asked to hold until clarification.  Please clarify dosage

## 2022-12-31 NOTE — Telephone Encounter (Signed)
This should not be something they perseverate on.  Blood pressures are good to be labile meaning they go up and down.  We do not want to overtreat and therefore we need to work in a sweet spot where we are not as concerned with blood pressures being in the systolic range of 120 to 160 mmHg.  Below 110, we need to be careful with how much she takes, and if sustained pressures over 160, we want her to take an additional dose.  It looks like her pressures are good to be high enough for her to simply take 50 mg of losartan daily-however if SBP<110 mmHg Prior to taking medication, only take 25 mg.  Do not check blood pressure until at least 2 to 3 hours after taking medication.  If the BP is > 160/90 mmHg -have her sit and rest quietly for an hour and then reassess.  If it still remains at that level, then take an additional 25 mg.  My goal is to try to have her on a standing dose and I just wanted to see if she really was in need to 50 mg every day or not.  If she is having to take 50 mg because of BP being elevated otherwise, then lets just keep her on the 50 and use additional 25 as needed for higher blood pressures that are sustained.  They only need to keep a BP log for about a week to see what the average trend is.  After that we can hopefully see a trend and how much she is going to need to take.  Bryan Lemma, MD

## 2022-12-31 NOTE — Telephone Encounter (Signed)
Pt c/o medication issue:  1. Name of Medication: Losartan  2. How are you currently taking this medication (dosage and times per day)?  Daughter wants the correct directions on how the pt is supposed to take her Losartan  3. Are you having a reaction (difficulty breathing--STAT)?   4. What is your medication issue?   She knows the pt take 50 mg of Losartan daily, but need to know what were the other instruction about when her blood pressure is high

## 2023-01-01 NOTE — Telephone Encounter (Signed)
LVM to please call me back to discuss recommendations

## 2023-01-01 NOTE — Telephone Encounter (Signed)
Resolved in other message.

## 2023-01-01 NOTE — Telephone Encounter (Signed)
Spoke with daughter and read verbatim the message from Dr Herbie Baltimore.  She used her mothers phone to record the information, as she was driving.  She does carify the instructions again after this and states understanding.  She will call next week with the BP log unless she has concerns prior to this.

## 2023-01-04 ENCOUNTER — Telehealth: Payer: Self-pay | Admitting: Pharmacist

## 2023-01-04 DIAGNOSIS — G459 Transient cerebral ischemic attack, unspecified: Secondary | ICD-10-CM

## 2023-01-04 DIAGNOSIS — I10 Essential (primary) hypertension: Secondary | ICD-10-CM

## 2023-01-04 NOTE — Telephone Encounter (Signed)
PharmD reviewed patient chart to assess eligibility for [facility] services. Patient was determined to be a good candidate for the program given the complexity of the medication regimen and overall risk for hospitalization and/or high healthcare utilization.   Referral entered in order to outreach patient and offer appointment with PharmD. Referral cosigned to PCP.  

## 2023-01-05 ENCOUNTER — Telehealth: Payer: Self-pay

## 2023-01-05 NOTE — Progress Notes (Signed)
Care Management & Coordination Services Pharmacy Team  Reason for Encounter: Initial Appointment Reminder  Contacted patient to confirm telephone appointment with [name] [name], PharmD on 01/08/2023 at 9:00.  {US HC Outreach:[id number]}  Do you have any problems getting your medications? {yes/no:20286} If yes what types of problems are you experiencing? {Problems:27223}  What is your top health concern you would like to discuss at your upcoming visit?   Have you seen any other providers since your last visit with PCP? Yes   Chart review:  Recent office visits:  ***  Recent consult visits:     07/15/22 Serena Croissant, MD (Hematology) Anemia of chronic disease No med changes F/U 3 months  Hospital visits:  Medication Reconciliation was completed by comparing discharge summary, patient's EMR and Pharmacy list, and upon discussion with patient.  Admitted to the hospital on 12/22/2022 due to TIA. Discharge date was 12/24/2022. Discharged from [facility]    New?Medications Started at [facility] Discharge:?? clopidogrel 75 MG tablet   Medication Changes at Hospital Discharge: aspirin EC 81 MG tablet losartan (COZAAR) 50 MG tablet metoprolol tartrate 25 MG tablet   Medications that remain the same after Hospital Discharge:??  -All other medications will remain the same.    Star Rating Drugs:  Medication:  Last Fill: Day Supply Atorvastatin 40 mg 12/18/22 90 Losartan 50 mg 12/24/22 30  Care Gaps: Annual wellness visit in last year? Yes 12/09/2022  [name] [name], PharmD notified  [name], RMA Clinical Pharmacy Assistant [phone]

## 2023-01-08 ENCOUNTER — Ambulatory Visit: Payer: Medicare Other | Admitting: Pharmacist

## 2023-01-08 NOTE — Progress Notes (Signed)
Care Management & Coordination Services Pharmacy Note  01/08/2023 Name:  [name] [name] MRN:  [id number] DOB:  30-[name]-1941  Summary: Initial televisit -Reviewed recent hospitalization; pt endorses compliance with changes (taking Plavix + aspirin x 3 weeks then drop aspirin; taking losartan 50 mg and metoprolol 37.5 mg BID) -HTN: BP is elevated at home, pt is checking AM before meds and 2-3 hrs after meds; in AM 163/83, 191/92, 186/96. Per cardiology notes, ideally pt will check only once 2-3 hrs after meds and abide by permissive HTN strategy (goal SBP 120-160) -HLD/CAD: LDL 76 (12/2022), atorvastatin was just increased to 40 mg to try to achieve LDL goal < 70; pt denies any concerns/side effects with this  Recommendations/Changes made from today's visit: -Advised to check BP only once 2-3 hrs after AM meds; goal SBP 120-160; only take extra losartan 25 mg if SBP > 160 at that point -Advised she can stop aspirin Friday 5/24, continue Plavix as directed  Follow up plan: -Pharmacist follow up PRN -Hematology 01/13/23; Neurology 03/09/23    Subjective: [name] [name] is an 82 y.o. year old female who is a primary patient of Tower, Audrie Gallus, MD.  The care coordination team was consulted for assistance with disease management and care coordination needs.    Engaged with patient by telephone for initial visit. Patient lives with her daughter currently. Pt retired 3 years ago due to broken femur; she does not drive anymore due to children's wishes  Recent office visits: 12/22/22 Dr Milinda Antis OV: annual - currently off plavix.   Recent consult visits: 12/24/22 Cardiology TE: goal SBP 120-160. Take losartan 50 mg, unless SBP < 110 then take 25 mg. Take BP 2-3 hrs after med. If BP > 160, wait an hr and reassess. If still high take extra 25 mg.   12/18/22 Dr Herbie Baltimore (Cardiology): Increase atorvastatin to 40 mg.   12/15/22 [facility]: shoulder injection  10/28/22 NP Causey (Heme/onc): anemia - s/p  retacrit.   Hospital visits: 12/22/22 - 12/24/22 Admission [facility]): Encephalopathy - TIA cannot be ruled out. Permissive HTN BP < 220/110. Start on plavix + ASA x 3 week then plavix alone. Increase losartan to 50 mg and metoprolol to 37.5 mg BID   Objective:  Lab Results  Component Value Date   CREATININE 0.93 12/24/2022   BUN 14 12/24/2022   GFR 52.80 (L) 12/17/2022   EGFR 59 (L) 12/10/2014   GFRNONAA >60 12/24/2022   GFRAA 56 (L) 05/02/2020   NA 138 12/24/2022   K 4.3 12/24/2022   CALCIUM 9.2 12/24/2022   CO2 27 12/24/2022   GLUCOSE 103 (H) 12/24/2022    Lab Results  Component Value Date/Time   HGBA1C 5.7 12/17/2022 08:28 AM   HGBA1C 5.3 10/19/2018 10:03 AM   GFR 52.80 (L) 12/17/2022 08:28 AM   GFR 51.22 (L) 03/11/2022 12:55 PM    Last diabetic Eye exam: No results found for: "HMDIABEYEEXA"  Last diabetic Foot exam: No results found for: "HMDIABFOOTEX"   Lab Results  Component Value Date   CHOL 149 12/23/2022   HDL 42 12/23/2022   LDLCALC 76 12/23/2022   LDLDIRECT 75.0 12/02/2021   TRIG 154 (H) 12/23/2022   CHOLHDL 3.5 12/23/2022       Latest Ref Rng & Units 12/24/2022    7:04 AM 12/23/2022    4:36 AM 12/22/2022    9:55 PM  Hepatic Function  Total Protein 6.5 - 8.1 g/dL 6.2  6.2  6.5   Albumin 3.5 - 5.0  g/dL 3.2  3.3  3.4   AST 15 - 41 U/L 18  20  21    ALT 0 - 44 U/L 14  14  15    Alk Phosphatase 38 - 126 U/L 48  51  50   Total Bilirubin 0.3 - 1.2 mg/dL 0.6  0.6  0.7     Lab Results  Component Value Date/Time   TSH 2.419 12/22/2022 09:55 PM   TSH 2.29 12/17/2022 08:28 AM   TSH 7.13 (H) 12/02/2021 09:06 AM   FREET4 0.63 12/17/2022 08:28 AM       Latest Ref Rng & Units 12/24/2022    7:04 AM 12/23/2022    4:36 AM 12/22/2022   10:00 PM  CBC  WBC 4.0 - 10.5 K/uL 6.8  11.8    Hemoglobin 12.0 - 15.0 g/dL 60.4  54.0  98.1   Hematocrit 36.0 - 46.0 % 33.0  33.4  32.0   Platelets 150 - 400 K/uL 225  219     Iron/TIBC/Ferritin/ %Sat    Component Value Date/Time    IRON 64 12/24/2022 0704   IRON 35 (L) 08/28/2015 1313   TIBC 305 12/24/2022 0704   TIBC 276 08/28/2015 1313   FERRITIN 62 12/24/2022 0704   FERRITIN 232 08/28/2015 1313   IRONPCTSAT 21 12/24/2022 0704   IRONPCTSAT 13 (L) 08/28/2015 1313   IRONPCTSAT 14 (L) 05/19/2012 1039    Lab Results  Component Value Date/Time   VD25OH 59.48 12/17/2022 08:28 AM   VD25OH 39.64 12/02/2021 09:06 AM   VITAMINB12 306 12/24/2022 07:04 AM   VITAMINB12 365 12/17/2022 08:28 AM    Clinical ASCVD: Yes  The ASCVD Risk score (Arnett DK, et al., 2019) failed to calculate for the following reasons:   The 2019 ASCVD risk score is only valid for ages 41 to 78        12/09/2022    9:22 AM 03/11/2022   11:52 AM 12/03/2021    9:09 AM  Depression screen PHQ 2/9  Decreased Interest 0 0 0  Down, Depressed, Hopeless 0 0 0  PHQ - 2 Score 0 0 0     Social History   Tobacco Use  Smoking Status Never   Passive exposure: Never  Smokeless Tobacco Never   BP Readings from Last 3 Encounters:  12/24/22 (!) 174/62  12/22/22 139/62  12/18/22 (!) 180/86   Pulse Readings from Last 3 Encounters:  12/24/22 64  12/22/22 69  12/18/22 66   Wt Readings from Last 3 Encounters:  12/23/22 173 lb 1 oz (78.5 kg)  12/22/22 173 lb (78.5 kg)  12/18/22 172 lb 6.4 oz (78.2 kg)   BMI Readings from Last 3 Encounters:  12/23/22 30.90 kg/m  12/22/22 30.89 kg/m  12/18/22 30.54 kg/m    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   Ace Inhibitors     cough   Hydrochlorothiazide     Hyponatremia    Oxaprozin     GI upset    Pregabalin Swelling    SWELLING REACTION UNSPECIFIED    Amoxicillin-Pot Clavulanate Nausea And Vomiting    gi upset Has patient had a PCN reaction causing immediate rash, facial/tongue/throat swelling, SOB or lightheadedness with hypotension: No Has patient had a PCN reaction causing severe rash involving mucus membranes or skin  necrosis: No Has patient had a PCN reaction that required hospitalization No Has patient had a PCN reaction occurring within the last 10  years: No If all of the above answers are "NO", then may proceed with Cephalosporin use.     Medications Reviewed Today     Reviewed by Kathyrn Sheriff, [facility] (Pharmacist) on 01/08/23 at [id number]  Med List Status: <None>   Medication Order Taking? Sig Documenting Provider Last Dose Status Informant  acetaminophen (TYLENOL) 500 MG tablet [id number] Yes Take 1,000 mg by mouth 2 (two) times daily as needed for moderate pain or headache.  [provider] Taking Active Self  albuterol (VENTOLIN HFA) 108 (90 Base) MCG/ACT inhaler [id number] No INHALE 1 PUFF INTO THE LUNGS EVERY 6 (SIX) HOURS AS NEEDED (FOR COUGH).  Patient not taking: Reported on 01/08/2023   Judy Pimple, MD Not Taking Active Self  aspirin EC 81 MG tablet [id number] Yes Take 1 tablet (81 mg total) by mouth daily for 21 days. Swallow whole. Sheikh, Omair Palominas, DO Taking Active   atorvastatin (LIPITOR) 40 MG tablet [id number] Yes Take 1 tablet (40 mg total) by mouth daily. Marykay Lex, MD Taking Active Self  benzonatate (TESSALON) 200 MG capsule [id number] Yes TAKE 1 CAPSULE (200 MG TOTAL) BY MOUTH 3 (THREE) TIMES DAILY AS NEEDED FOR COUGH. Gweneth Dimitri, MD Taking Active Self  busPIRone (BUSPAR) 15 MG tablet [id number] Yes TAKE 1 TABLET BY MOUTH EVERY DAY Tower, Audrie Gallus, MD Taking Active   cetirizine (ZYRTEC) 10 MG tablet [id number] Yes TAKE 1 TABLET BY MOUTH EVERY DAY  Patient taking differently: Take 10 mg by mouth daily.   Tower, Audrie Gallus, MD Taking Active Self  clopidogrel (PLAVIX) 75 MG tablet [id number] Yes Take 1 tablet (75 mg total) by mouth daily. Sheikh, Omair [place]ont, DO Taking Active   fluticasone (FLONASE) 50 MCG/ACT nasal spray [id number] No PLACE 1 SPRAY INTO BOTH NOSTRILS DAILY AS NEEDED.  Patient not taking: Reported on 01/08/2023   Judy Pimple, MD Not Taking Active Self   HYDROcodone-acetaminophen (NORCO/VICODIN) 5-325 MG tablet [id number] Yes Take 1 tablet by mouth 3 (three) times daily as needed for moderate pain. Serena Croissant, MD Taking Active Self           Med Note Janelle Floor Jul 22, 2020 12:00 PM)    losartan (COZAAR) 50 MG tablet [id number] Yes Take 1 tablet (50 mg total) by mouth daily. Marykay Lex, MD Taking Active   Melatonin 3 MG CAPS [id number] Yes Take 6 mg by mouth at bedtime.  [provider] Taking Active Self  methocarbamol (ROBAXIN) 500 MG tablet [id number] Yes TAKE 1 TABLET BY MOUTH EVERY 8 HOURS AS NEEDED FOR MUSCLE SPASM  Patient taking differently: Take 500 mg by mouth every 8 (eight) hours as needed for muscle spasms.   Tower, Audrie Gallus, MD Taking Active Self  metoprolol tartrate (LOPRESSOR) 25 MG tablet [id number] Yes Take 1.5 tablets (37.5 mg total) by mouth 2 (two) times daily. Sheikh, Omair Provo, DO Taking Active   nitroGLYCERIN (NITROSTAT) 0.4 MG SL tablet [id number] No Place 1 tablet (0.4 mg total) under the tongue every 5 (five) minutes as needed.  Patient not taking: Reported on 01/08/2023   Marykay Lex, MD Not Taking Active Self  nystatin (MYCOSTATIN/NYSTOP) powder [id number] Yes Apply topically 2 (two) times daily as needed. To affected areas to prevent yeast (in skin folds) Tower, Audrie Gallus, MD Taking Active Self  pantoprazole (PROTONIX) 40 MG tablet [id number] Yes TAKE 1 TABLET BY MOUTH EVERY DAY Marykay Lex, MD Taking Active Self  sertraline (ZOLOFT) 25 MG tablet  [id number] Yes TAKE 1 TABLET (25 MG TOTAL) BY MOUTH DAILY. Tower, Audrie Gallus, MD Taking Active Self  Vitamin D, Cholecalciferol, 25 MCG (1000 UT) CAPS [id number] Yes Take 2,000 Units by mouth in the morning and at bedtime.  [provider] Taking Active Self            SDOH:  (Social Determinants of Health) assessments and interventions performed: No SDOH Interventions    Flowsheet Row Clinical Support from 12/09/2022 in [facility]  [facility] Clinical Support from 12/03/2021 in [facility] Clinical Support from 10/30/2020 in [facility] Clinical Support from 10/27/2019 in [facility] Clinical Support from 10/19/2018 in [facility] Clinical Support from 10/12/2017 in [facility]  SDOH Interventions        Food Insecurity Interventions Intervention Not Indicated Intervention Not Indicated -- -- -- --  Housing Interventions Intervention Not Indicated Intervention Not Indicated -- -- -- --  Transportation Interventions Intervention Not Indicated Intervention Not Indicated -- -- -- --  Utilities Interventions Intervention Not Indicated -- -- -- -- --  Alcohol Usage Interventions Intervention Not Indicated (Score <7) -- -- -- -- --  Depression Interventions/Treatment  -- -- Currently on Treatment, PHQ2-9 Score <4 Follow-up Not Indicated PHQ2-9 Score <4 Follow-up Not Indicated PHQ2-9 Score <4 Follow-up Not Indicated --  [referral to PCP]  Financial Strain Interventions Intervention Not Indicated Intervention Not Indicated -- -- -- --  Physical Activity Interventions Patient Refused, Other (Comments) Other (Comments)  [Recently finished 6 weeks of PT due to femur fx.] -- -- -- --  Stress Interventions Intervention Not Indicated Intervention Not Indicated  [Due to family stress.] -- -- -- --  Social Connections Interventions Intervention Not Indicated Intervention Not Indicated -- -- -- --       Medication Assistance: None required.  Patient affirms current coverage meets needs.  Medication Access: Name and location of current pharmacy:  [id number] [place], [place] - [address] [place] [address] Phone: [phone] Fax: [phone]  [facility] [address] [place] [address] Phone: [phone] Fax: [phone]  Within the past 30 days, how often has patient missed a dose of medication? 0 Is a pillbox or other method used to improve adherence? Yes  Factors that may affect medication adherence? no barriers identified Are meds synced by current pharmacy? No  Are meds delivered by current pharmacy? No  Does patient experience delays in picking up medications due to transportation concerns? No   Compliance/Adherence/Medication fill history: Care Gaps: None  Star-Rating Drugs: Atorvastatin - PDC 100% Losartan - PDC 100%   ASSESSMENT / PLAN  Hypertension (BP goal SBP 120-160) -Not ideally controlled - BP elevated in AM before meds, she has been advised by cardiology to only check 2-3 hrs after meds but has been checking multiple times; currently permissive HTN is the goal per cardiology -Current BP home readings:pt checking first in AM, then 2-3 hrs after meds 5/16 163/83  5/15 191/92 5/14 186/96  -Current treatment: Losartan 50 mg daily AM; take extra 25 mg if SBP > 160 2hrs after meds - Appropriate, Query Effective Metoprolol tartrate 25 mg - 1.5 tab BID - Appropriate, Query Effective -Medications previously tried: n/a -Educated on BP goals and benefits of medications for prevention  of heart attack, stroke and kidney damage; Importance of home blood pressure monitoring; Proper BP monitoring technique; -Discussed it is possible to check BP too much, perseverating on BP can lead to higher BP -Counseled to monitor BP at home daily 2-3 hrs after medications ONLY, document, and provide log at future appointments -Recommended to continue current medication  Hyperlipidemia: (LDL goal < 70) -Query Controlled - LDL 76 (12/2022), atorvastatin just recently increased to 40 mg to achieve LDL < 70 -Hx CAD, TIA (12/22/22 - plan DAPT x 3 weeks then Plavix alone) -Current treatment: Atorvastatin 40 mg daily PM -  Appropriate, Effective, Safe, Accessible Clopidogrel 75 mg daily AM - Appropriate, Effective, Safe, Accessible Aspirin 81 mg daily AM - Appropriate, Effective, Safe, Accessible Nitroglycerin 0.4 mg SL prn - Appropriate, Effective, Safe, Accessible -Medications previously tried: n/a  -Educated on Cholesterol goals; Benefits of statin for ASCVD risk reduction; -Reviewed plan to complete 3 weeks of plavix + aspirin, then stop aspirin; advised she can stop aspirin next Friday 5/24 -Recommended to continue current medication  Depression/Anxiety (Goal: manage symptoms) -Controlled - started medication ~3 years ago while in rehab from broken femur; she has been advised to continue since she is doing well and risk for depression is high with her lifestyle change and limited independence -PHQ9: 0 (11/2022) -GAD7: not on file -Connected with PCP for mental health support -Current treatment: Sertraline 25 mg daily - Appropriate, Effective, Safe, Accessible Buspirone 15 mg daily - Appropriate, Effective, Safe, Accessible -Medications previously tried/failed: n/a -Educated on Benefits of medication for symptom control -Recommended to continue current medication  Osteopenia (Goal prevent fractures) -Controlled -Last DEXA Scan: 10/2022   T-Score lumbar spine: -0.8  T-Score forearm radius: -2.1 -Patient is not a candidate for pharmacologic treatment -Current treatment  Vitamin D 2000 IU - Appropriate, Effective, Safe, Accessible -Medications previously tried: n/a  -Recommend [id number] units of vitamin D daily. Recommend 1200 mg of calcium daily from dietary and supplemental sources.  Chronic pain (Goal: manage pain) -Not ideally controlled - pt reports pain is worse with rainy weather; she usually takes Norco AM and PM, with extra pill for PRN use (usually waking up in middle night) -Hx PMR, no longer on prednisone; Follows w/ pain mgmt (Dr Ethelene Hal) -Hx TKA; hx femur fracture 2021 - pt reports painful  since then; she walks with cane or walker -Current treatment  Hydrocodone-APAP 7.5-325 mg #90/month -Appropriate, Effective, Safe, Accessible Methocarbamol 500 mg #90/month - Appropriate, Effective, Safe, Accessible -Medications previously tried: prednisone, methotrexate -Reviewed caution drowsiness/sedation with pain meds; pt denies these; she also denies constipation -Recommended to continue current medication  Anemia (Goal: HGB > 10) -Controlled - HGB 10.4 (12/2022), TSAT 21 (12/2022) -Follows with hematology (Dr Pamelia Hoit); notably NOT iron deficient -Current treatment  Retacrit inj monthly - Appropriate, Effective, Safe, Accessible -Medications previously tried: n/a  -Recommended to continue current medication; f/u with hematology as scheduled  Chronic Kidney Disease Stage 3a  -All medications assessed for renal dosing and appropriateness in chronic kidney disease. -Recommended to continue current medication  Health Maintenance -Vaccine gaps: Shingrix, TDAP -GERD: pantoprazole 40 mg; not needing tums  -Allergies: cetirizine, flonase, albuterol    Al Corpus, PharmD, BCACP, CPP Clinical Pharmacist Practitioner [facility] at [facility] [phone]

## 2023-01-12 ENCOUNTER — Other Ambulatory Visit: Payer: Self-pay | Admitting: *Deleted

## 2023-01-12 DIAGNOSIS — D649 Anemia, unspecified: Secondary | ICD-10-CM

## 2023-01-12 NOTE — Progress Notes (Signed)
Patient Care Team: Tower, Audrie Gallus, MD as PCP - General Herbie Baltimore Piedad Climes, MD as PCP - Cardiology (Cardiology) Sheran Luz, MD as Consulting Physician (Physical Medicine and Rehabilitation) Zenovia Jordan, MD as Consulting Physician (Rheumatology) Haverstock, Elvin So, MD as Referring Physician (Dermatology) Serena Croissant, MD as Consulting Physician (Hematology and Oncology) Kathyrn Sheriff, [facility] as Pharmacist (Pharmacist)  DIAGNOSIS: No diagnosis found.  SUMMARY OF ONCOLOGIC HISTORY: Oncology History   No history exists.    CHIEF COMPLIANT: Follow-up of anemia   INTERVAL HISTORY: [name] [name] is a 82 y.o. with above-mentioned history of anemia who currently receives Retacrit. She presents to the clinic today for follow-up.    ALLERGIES:  is allergic to sulfonamide derivatives, ace inhibitors, hydrochlorothiazide, oxaprozin, pregabalin, and amoxicillin-pot clavulanate.  MEDICATIONS:  Current Outpatient Medications  Medication Sig Dispense Refill   acetaminophen (TYLENOL) 500 MG tablet Take 1,000 mg by mouth 2 (two) times daily as needed for moderate pain or headache.      albuterol (VENTOLIN HFA) 108 (90 Base) MCG/ACT inhaler INHALE 1 PUFF INTO THE LUNGS EVERY 6 (SIX) HOURS AS NEEDED (FOR COUGH). (Patient not taking: Reported on 01/08/2023) 6.7 each 1   aspirin EC 81 MG tablet Take 1 tablet (81 mg total) by mouth daily for 21 days. Swallow whole. 21 tablet 0   atorvastatin (LIPITOR) 40 MG tablet Take 1 tablet (40 mg total) by mouth daily. 90 tablet 3   benzonatate (TESSALON) 200 MG capsule TAKE 1 CAPSULE (200 MG TOTAL) BY MOUTH 3 (THREE) TIMES DAILY AS NEEDED FOR COUGH. 90 capsule 1   busPIRone (BUSPAR) 15 MG tablet TAKE 1 TABLET BY MOUTH EVERY DAY 90 tablet 3   cetirizine (ZYRTEC) 10 MG tablet TAKE 1 TABLET BY MOUTH EVERY DAY (Patient taking differently: Take 10 mg by mouth daily.) 90 tablet 1   clopidogrel (PLAVIX) 75 MG tablet Take 1 tablet (75 mg total) by mouth  daily. 30 tablet 1   fluticasone (FLONASE) 50 MCG/ACT nasal spray PLACE 1 SPRAY INTO BOTH NOSTRILS DAILY AS NEEDED. (Patient not taking: Reported on 01/08/2023) 48 mL 1   HYDROcodone-acetaminophen (NORCO/VICODIN) 5-325 MG tablet Take 1 tablet by mouth 3 (three) times daily as needed for moderate pain. 30 tablet 0   losartan (COZAAR) 50 MG tablet Take 1 tablet (50 mg total) by mouth daily. 30 tablet 6   Melatonin 3 MG CAPS Take 6 mg by mouth at bedtime.      methocarbamol (ROBAXIN) 500 MG tablet TAKE 1 TABLET BY MOUTH EVERY 8 HOURS AS NEEDED FOR MUSCLE SPASM (Patient taking differently: Take 500 mg by mouth every 8 (eight) hours as needed for muscle spasms.) 90 tablet 0   metoprolol tartrate (LOPRESSOR) 25 MG tablet Take 1.5 tablets (37.5 mg total) by mouth 2 (two) times daily. 90 tablet 0   nitroGLYCERIN (NITROSTAT) 0.4 MG SL tablet Place 1 tablet (0.4 mg total) under the tongue every 5 (five) minutes as needed. (Patient not taking: Reported on 01/08/2023) 25 tablet 3   nystatin (MYCOSTATIN/NYSTOP) powder Apply topically 2 (two) times daily as needed. To affected areas to prevent yeast (in skin folds) 30 g 1   pantoprazole (PROTONIX) 40 MG tablet TAKE 1 TABLET BY MOUTH EVERY DAY 90 tablet 3   sertraline (ZOLOFT) 25 MG tablet TAKE 1 TABLET (25 MG TOTAL) BY MOUTH DAILY. 90 tablet 0   Vitamin D, Cholecalciferol, 25 MCG (1000 UT) CAPS Take 2,000 Units by mouth in the morning and at bedtime.  No current facility-administered medications for this visit.    PHYSICAL EXAMINATION: ECOG PERFORMANCE STATUS: {CHL ONC ECOG PS:[id number]}  There were no vitals filed for this visit. There were no vitals filed for this visit.  BREAST:*** No palpable masses or nodules in either right or left breasts. No palpable axillary supraclavicular or infraclavicular adenopathy no breast tenderness or nipple discharge. (exam performed in the presence of a chaperone)  LABORATORY DATA:  I have reviewed the data as  listed    Latest Ref Rng & Units 12/24/2022    7:04 AM 12/23/2022    4:36 AM 12/22/2022   10:00 PM  CMP  Glucose 70 - 99 mg/dL 161  096  045   BUN 8 - 23 mg/dL 14  15  21    Creatinine 0.44 - 1.00 mg/dL 4.09  8.11  9.14   Sodium 135 - 145 mmol/L 138  136  135   Potassium 3.5 - 5.1 mmol/L 4.3  3.8  4.2   Chloride 98 - 111 mmol/L 103  104  103   CO2 22 - 32 mmol/L 27  22    Calcium 8.9 - 10.3 mg/dL 9.2  8.7    Total Protein 6.5 - 8.1 g/dL 6.2  6.2    Total Bilirubin 0.3 - 1.2 mg/dL 0.6  0.6    Alkaline Phos 38 - 126 U/L 48  51    AST 15 - 41 U/L 18  20    ALT 0 - 44 U/L 14  14      Lab Results  Component Value Date   WBC 6.8 12/24/2022   HGB 10.4 (L) 12/24/2022   HCT 33.0 (L) 12/24/2022   MCV 90.9 12/24/2022   PLT 225 12/24/2022   NEUTROABS 4.5 12/24/2022    ASSESSMENT & PLAN:  No problem-specific Assessment & Plan notes found for this encounter.    No orders of the defined types were placed in this encounter.  The patient has a good understanding of the overall plan. she agrees with it. she will call with any problems that may develop before the next visit here. Total time spent: 30 mins including face to face time and time spent for planning, charting and co-ordination of care   Sherlyn Lick, CMA 01/12/23    I Janan Ridge am acting as a Neurosurgeon for [employer]  ***

## 2023-01-13 ENCOUNTER — Inpatient Hospital Stay: Payer: Medicare Other | Admitting: Hematology and Oncology

## 2023-01-13 ENCOUNTER — Inpatient Hospital Stay: Payer: Medicare Other

## 2023-01-13 ENCOUNTER — Inpatient Hospital Stay: Payer: Medicare Other | Attending: Hematology and Oncology

## 2023-01-13 ENCOUNTER — Other Ambulatory Visit: Payer: Self-pay

## 2023-01-13 VITALS — BP 169/101 | HR 62 | Temp 97.7°F | Resp 17 | Wt 173.2 lb

## 2023-01-13 DIAGNOSIS — D638 Anemia in other chronic diseases classified elsewhere: Secondary | ICD-10-CM | POA: Insufficient documentation

## 2023-01-13 DIAGNOSIS — M353 Polymyalgia rheumatica: Secondary | ICD-10-CM

## 2023-01-13 DIAGNOSIS — D649 Anemia, unspecified: Secondary | ICD-10-CM

## 2023-01-13 LAB — CMP (CANCER CENTER ONLY)
ALT: 13 U/L (ref 0–44)
AST: 18 U/L (ref 15–41)
Albumin: 3.9 g/dL (ref 3.5–5.0)
Alkaline Phosphatase: 57 U/L (ref 38–126)
Anion gap: 6 (ref 5–15)
BUN: 20 mg/dL (ref 8–23)
CO2: 27 mmol/L (ref 22–32)
Calcium: 8.9 mg/dL (ref 8.9–10.3)
Chloride: 105 mmol/L (ref 98–111)
Creatinine: 1.04 mg/dL — ABNORMAL HIGH (ref 0.44–1.00)
GFR, Estimated: 53 mL/min — ABNORMAL LOW (ref >60)
Glucose, Bld: 88 mg/dL (ref 70–99)
Potassium: 4.2 mmol/L (ref 3.5–5.1)
Sodium: 138 mmol/L (ref 135–145)
Total Bilirubin: 0.4 mg/dL (ref 0.3–1.2)
Total Protein: 6.8 g/dL (ref 6.5–8.1)

## 2023-01-13 LAB — CBC WITH DIFFERENTIAL (CANCER CENTER ONLY)
Abs Immature Granulocytes: 0.02 10*3/uL (ref 0.00–0.07)
Basophils Absolute: 0 10*3/uL (ref 0.0–0.1)
Basophils Relative: 1 %
Eosinophils Absolute: 0.1 10*3/uL (ref 0.0–0.5)
Eosinophils Relative: 2 %
HCT: 33.5 % — ABNORMAL LOW (ref 36.0–46.0)
Hemoglobin: 10.6 g/dL — ABNORMAL LOW (ref 12.0–15.0)
Immature Granulocytes: 0 %
Lymphocytes Relative: 28 %
Lymphs Abs: 1.9 10*3/uL (ref 0.7–4.0)
MCH: 29.3 pg (ref 26.0–34.0)
MCHC: 31.6 g/dL (ref 30.0–36.0)
MCV: 92.5 fL (ref 80.0–100.0)
Monocytes Absolute: 0.4 10*3/uL (ref 0.1–1.0)
Monocytes Relative: 5 %
Neutro Abs: 4.4 10*3/uL (ref 1.7–7.7)
Neutrophils Relative %: 64 %
Platelet Count: 209 10*3/uL (ref 150–400)
RBC: 3.62 MIL/uL — ABNORMAL LOW (ref 3.87–5.11)
RDW: 12.3 % (ref 11.5–15.5)
WBC Count: 6.8 10*3/uL (ref 4.0–10.5)
nRBC: 0 % (ref 0.0–0.2)

## 2023-01-13 LAB — IRON AND IRON BINDING CAPACITY (CC-WL,HP ONLY)
Iron: 45 ug/dL (ref 28–170)
Saturation Ratios: 13 % (ref 10.4–31.8)
TIBC: 335 ug/dL (ref 250–450)
UIBC: 290 ug/dL (ref 148–442)

## 2023-01-13 LAB — FERRITIN: Ferritin: 62 ng/mL (ref 11–307)

## 2023-01-13 LAB — VITAMIN B12: Vitamin B-12: 278 pg/mL (ref 180–914)

## 2023-01-13 NOTE — Assessment & Plan Note (Signed)
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    Lab review  10/12/2017: Hemoglobin 10.2, MCV 94, normal WBC and platelet counts, creatinine 1.02 04/13/2019: Hemoglobin 9 08/24/2019: Hemoglobin 10.2 02/08/2020: hemoglobin 10.2 03/08/20: Hemoglobin 9.9 05/02/2020: Hemoglobin 10.2 06/03/20: Hb 11.2 (did not receive Retacrit) 06/27/2020: Hemoglobin 10.3 (receiving Retacrit injection) 10/15/2020: Hemoglobin 10.2 (receiving Retacrit) 04/17/2021: Hemoglobin 10.6 (receiving Retacrit) now planning to do the injection every 3 months  07/21/21: hemoglobin: 9.9 (receiving Retacrit) 04/23/2022: Hemoglobin 10.7, ferritin 63, iron saturation 14%, B12 575 07/15/2022: Hemoglobin 9.9 01/13/2023:      Current treatment: Retacrit 40,000 units currently receiving it every 12 weeks. (Inj if Hb is less than 11)    Return to clinic in 6 months with labs

## 2023-01-20 ENCOUNTER — Telehealth: Payer: Self-pay | Admitting: Cardiology

## 2023-01-20 MED ORDER — CLOPIDOGREL BISULFATE 75 MG PO TABS: 75.0000 mg | ORAL_TABLET | Freq: Every day | ORAL | 5 refills | Status: DC

## 2023-01-20 NOTE — Telephone Encounter (Signed)
*  STAT* If patient is at the pharmacy, call can be transferred to refill team.   1. Which medications need to be refilled? (please list name of each medication and dose if known)   clopidogrel (PLAVIX) 75 MG tablet    2. Which pharmacy/location (including street and city if local pharmacy) is medication to be sent to?  [id number] [place], [place] - [address]    3. Do they need a 30 day or 90 day supply? 30 day

## 2023-01-20 NOTE — Telephone Encounter (Signed)
Refills has been sent to the pharmacy. 

## 2023-01-21 ENCOUNTER — Other Ambulatory Visit: Payer: Self-pay | Admitting: Family Medicine

## 2023-01-25 ENCOUNTER — Ambulatory Visit: Payer: Medicare Other | Attending: Cardiology | Admitting: Cardiology

## 2023-01-25 ENCOUNTER — Encounter: Payer: Self-pay | Admitting: Cardiology

## 2023-01-25 VITALS — BP 140/70 | HR 56 | Ht 62.0 in | Wt 171.6 lb

## 2023-01-25 DIAGNOSIS — I10 Essential (primary) hypertension: Secondary | ICD-10-CM

## 2023-01-25 DIAGNOSIS — G459 Transient cerebral ischemic attack, unspecified: Secondary | ICD-10-CM | POA: Diagnosis not present

## 2023-01-25 DIAGNOSIS — R5382 Chronic fatigue, unspecified: Secondary | ICD-10-CM | POA: Diagnosis not present

## 2023-01-25 DIAGNOSIS — I447 Left bundle-branch block, unspecified: Secondary | ICD-10-CM

## 2023-01-25 DIAGNOSIS — R600 Localized edema: Secondary | ICD-10-CM | POA: Diagnosis not present

## 2023-01-25 DIAGNOSIS — R0609 Other forms of dyspnea: Secondary | ICD-10-CM | POA: Diagnosis not present

## 2023-01-25 DIAGNOSIS — E785 Hyperlipidemia, unspecified: Secondary | ICD-10-CM | POA: Diagnosis not present

## 2023-01-25 DIAGNOSIS — I2510 Atherosclerotic heart disease of native coronary artery without angina pectoris: Secondary | ICD-10-CM | POA: Diagnosis not present

## 2023-01-25 DIAGNOSIS — Z9861 Coronary angioplasty status: Secondary | ICD-10-CM

## 2023-01-25 MED ORDER — METOPROLOL TARTRATE 25 MG PO TABS: 25.0000 mg | ORAL_TABLET | Freq: Two times a day (BID) | ORAL | 0 refills | Status: DC

## 2023-01-25 MED ORDER — LOSARTAN POTASSIUM 50 MG PO TABS: 50.0000 mg | ORAL_TABLET | Freq: Every day | ORAL | 6 refills | Status: DC

## 2023-01-25 NOTE — Progress Notes (Signed)
Primary Care Provider: Judy Pimple, MD [facility] Cardiologist: Bryan Lemma, MD Electrophysiologist: None  Clinic Note: Chief Complaint  Patient presents with   Hospitalization Follow-up    Recent admission for possible TIA versus need TID mediated dizziness.  Otherwise stable now.   Hypertension    Still has borderline pressures.  Some low pressures now.    ===================================  ASSESSMENT/PLAN   Problem List Items Addressed This Visit       Cardiology Problems   TIA (transient ischemic attack)    Pretty much stable meds-now on Plavix monotherapy being managed by neurology.  Symptoms seem to have resolved.      Relevant Medications   metoprolol tartrate (LOPRESSOR) 25 MG tablet   losartan (COZAAR) 50 MG tablet   Hyperlipidemia with target low density lipoprotein (LDL) cholesterol less than 70 mg/dL (Chronic)    Recently increased Lipitor to 40 mg.  Should be due for labs rechecked soon  Low threshold to consider adding Zetia.  I am just very reluctant to increase the extent of her polypharmacy.      Relevant Medications   metoprolol tartrate (LOPRESSOR) 25 MG tablet   losartan (COZAAR) 50 MG tablet   Essential hypertension (Chronic)    With pretty labile blood pressures I would prefer for her to have permissive hypertension.  If he has persistent blood pressure greater than 160 mmHg (Accu-Cheks) can take an additional 50 mg losartan otherwise discontinue current meds.      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  Back on Plavix.  Reducing dose of metoprolol back to 25 mg twice daily and continuing losartan 50 mg daily. Continue 40 mg atorvastatin      Relevant Medications   metoprolol tartrate (LOPRESSOR) 25 MG tablet   losartan (COZAAR) 50 MG tablet   Other  Relevant Orders   EKG 12-Lead (Completed)   CAD S/P percutaneous coronary angioplasty - Primary (Chronic)    No active angina. Now back on Plavix for stroke.  Of note, decisions about holding Plavix are not up to me they are up to neurology.  Plan: Continue Plavix for now, but when switched off of Plavix, would go back to aspirin 81 mg. On Lopressor which I think we can reduce back down to 25 mg twice daily because of bradycardia. Continue losartan 50 mg daily. Continue atorvastatin 40 mg daily.  She is tolerating well.      Relevant Medications   metoprolol tartrate (LOPRESSOR) 25 MG tablet   losartan (COZAAR) 50 MG tablet   Other Relevant Orders   EKG 12-Lead (Completed)     Other   Pedal edema (Chronic)    Also pretty stable.   Discussed fibrillation.  Trying to avoid diuretic      Left bundle branch block (LBBB) on electrocardiogram (Chronic)    Asymptomatic.  Stable.  Nonischemic Myoview on first evaluation.      Relevant Orders   EKG 12-Lead (Completed)   Dyspnea on exertion (Chronic)    I think this is more based on deconditioning. Not really complaining that today.  I think she is just deconditioned.      Relevant Orders   EKG 12-Lead (Completed)   Chronic fatigue (Chronic)    Longstanding issue.  I think it would be best for her to simply be on low-dose beta-blocker.  Will reduce her back to 25 milligram twice daily       ===================================  HPI:    [name] [name] is a 82 y.o. female with a PMH below who presents today for Hospital f/u.Marland Kitchen  LAURALYE STAPF was last seen on 12/18/22 - she was in her usual feisty mood.  She is accompanied by her daughter-in-law.  Did not sleep well the previous night - was tired & had a BIG cup of coffee in her hand as she walked in to the clinic room -> BP was quite high.  180/8u0 on recheck.  - Noted still being limited by Leg Pain - walking with cane for short distances & walker for longer distance.  No real  exertional dyspnea to be said that she exerts herself.  Just a significant effort to walk.  No chest pain or pressure.  Trivial edema; No PND or orthopnea.  increased to 40 mg atorvastatin -> b/c LDL was 78  Recent Hospitalizations:  => Seen by Dr. Milinda Antis on April 30-was feeling well.  BP was roughly 140/80.  Was continued on Lopressor 25 twice daily and losartan 25 mg daily.  4/30-12/24/2022 -> TIA: Presented with sudden weakness at roughly 7:30 PM.  She had been feeling well.  Went to lunch with her daughter and another relative after her PCP visit, and then drove up to [place] to do some "gambling ".  They went home by 6 PM and then she developed acute generalized weakness with slurred speech at roughly 730 and came to the ER. => BP was up to 199/77 => minutes diagnosis was a likely TIA Started on Plavix along with ASA. X 3 weeks (DAPT) then drop ASA. Losartan increased to 50 mg, and Lopressor increased to 1/2 tablet twice daily (37.5 mg) UA showed leukocytosis with positive nitrates unfortunately culture not tracked.  Was treated with 3 days of IV ceftriaxone and placed on p.o. antibiotics at discharge.   Reviewed  CV studies:    The following studies were reviewed today: (if available, images/films reviewed: From Epic Chart or Care Everywhere) Echo 12/23/2022: Nl LVEF 60-65%.  No RWMA. Mild Conc LVH. GR 1 DD (normal for age).  Normal RV - RVP & RAP. Small pericardial effusion. Normal MV with mild AoV thickening.  Comparison(s): No significant change from prior study.  CT Angio Head and Neck 12/22/2022: Calcific atherosclerosis of the aortic arch.  No aneurysm or dissection.  Bilateral proximal subclavian arteries appear to be patent.  Bilateral carotid arteries show no dissection, occlusion or aneurysm.  Mild plaque at the bifurcation without stenosis.  Left dominant vertebrals both patent without dissection or occlusion or flow-limiting lesions to the skull base.  Normal anterior and posterior  circulations.  Normal venous sinuses. No emergent large vessel occlusion or hemodynamically significant stenosis of the head and neck.   Mild bilateral carotid bifurcation atherosclerosis without hemodynamically significant stenosis.  Interval History:   GEMINI SALMERON presents here today because she is a little unsure what she should do with her medications.  She wanted my clarification.  She tells me that when she went to the hospital she felt both arms feeling weak and she fell this seems like she may pass out.  She does not recall mentioning any mild slurred speech but could have.  She denied any symptoms of rapid regular heartbeats or palpitations.  At present she seems like she is back to normal again.  No recurrent TIA or emesis.  Symptoms.  No chest  pain or pressure with rest or exertion.  CV Review of Symptoms (Summary):: positive for - dyspnea on exertion, edema, and mostly deconditioned with increased work of walking leading to dyspnea.  Mild end of day edema. negative for - chest pain, irregular heartbeat, orthopnea, palpitations, paroxysmal nocturnal dyspnea, rapid heart rate, shortness of breath, or recurrent weakness/numbness consistent with TIA or amaurosis fugax/CVA symptoms. .  No syncope or near syncope, or claudication.   REVIEWED OF SYSTEMS   Review of Systems  Constitutional:  Positive for malaise/fatigue (Mostly deconditioning.  No exercise.  Poor sleep). Negative for weight loss.  HENT:  Negative for congestion.   Respiratory:  Negative for cough and shortness of breath.   Cardiovascular:  Positive for leg swelling (Stable).  Gastrointestinal:  Negative for blood in stool.  Genitourinary:  Negative for hematuria.  Musculoskeletal:  Positive for joint pain (Right hip and leg.). Negative for falls.  Neurological:  Positive for dizziness, focal weakness and headaches.  Psychiatric/Behavioral:  Positive for memory loss. Negative for depression. The patient has insomnia.  The patient is not nervous/anxious.    I have reviewed and (if needed) personally updated the patient's problem list, medications, allergies, past medical and surgical history, social and family history.   PAST MEDICAL HISTORY   Past Medical History:  Diagnosis Date   Anemia    of chronic disease   Anxiety    Arthritis    "psoratic arthritis; new dx" (09/11/2016)   CAD S/P percutaneous coronary angioplasty 08/2016   mLCx 90% -> 0% (2.75 mm x 16 mm) Synergy DES PCI Ostial D1 70% (not PTCA target). Moderate ostial and mD2.  LAD tandem 50% stenoses w/ negative FFR (0.86)   Cancer (HCC)    skin cancer on leg   Chronic lower back pain    Complication of anesthesia 1980 and 1982   trouble waking up after breast biopsy, many surgeries since no problem   Depression    Dyspnea on effort - CHRONIC    partial relief of Sx post PCI of L Circumflex.   Fibrocystic breast    GERD (gastroesophageal reflux disease)    Headache    "monthly" (09/11/2016)   History of blood transfusion    "several since age 54; I've had them after most of my surgeries" (09/11/2016)   History of myocardial infarction     - Myoview [name] 2018 indicated small Inferior - Inferoseptal Infarct (vs. rest ischemia). Echo with inferoseptal hypokinesis.   Hx of skin cancer, basal cell 2013 and 2012   right inner, lower leg; several scattered around my body   Hypertension    Iron deficiency anemia    "I've had an iron infusion"   Osteoarthritis    Osteopenia    Peptic ulcer disease    H pylori   PMR (polymyalgia rheumatica) (HCC)    PONV (postoperative nausea and vomiting)    Psoriasis    TIA (transient ischemic attack) 06/03/2020   Symptoms of right arm weakness/numbness and slurred speech as well as right droopm => MRI brain/MRA head normal.  Normal echo normal carotid Dopplers.    PAST SURGICAL HISTORY -> reviewed in epic   Pertinent Cardiovascular Procedures  Procedure Laterality Date   CARDIAC CATHETERIZATION N/A  09/11/2016   Procedure: Left Heart Cath and Coronary Angiography;  Surgeon: Marykay Lex, MD;  Location: [facility] INVASIVE CV LAB;  Service: Cardiovascular: mLCx 90% * (PCI). Ostial D1 70% (not PTCA target). Mod ostial and mD2.  LAD tandem 50% stenoses w/  negative FFR (0.86)   CARDIAC CATHETERIZATION N/A 09/11/2016   Procedure: Intravascular Pressure Wire/FFR Study;  Surgeon: Marykay Lex, MD;  Location: [facility] INVASIVE CV LAB;  Service: Cardiovascular: --  LAD tandem 50% stenoses w/ negative FFR (0.86   CARDIAC CATHETERIZATION N/A 09/11/2016   Procedure: Coronary Stent Intervention;  Surgeon: Marykay Lex, MD;  Location: [facility] INVASIVE CV LAB;  Service: Cardiovascular: -- DES PCI mLCx 90%--0% SYNERGY DES 2.75 MM X 16 MM.   CARDIAC CATHETERIZATION  2001   non-obstructive CAD   NM MYOVIEW LTD  08/28/2016   INTERMEDIATE RISK (b/c reduced EF & ? small infarct) -- EF ~35-40%.  ? Prior Small Inferior-apical & septal infarct (w/o ischemia).  Difuse HK - worse in inferoapex.  Suggest prior infarct with no ischemia. Ischemic area correlates with echo WMA   NM MYOVIEW LTD  05/27/2021   Findings consistent with prior anteroapical infarct with fixed defect.  No peri-infarct ischemia.  Notable improvement from prior study.:   TOTAL HIP ARTHROPLASTY Right 04/04/2013   Procedure: RIGHT TOTAL HIP ARTHROPLASTY ANTERIOR APPROACH;  Surgeon: Shelda Pal, MD;  Location: [facility] ORS;  Service: Orthopedics;  Laterality: Right;   TOTAL KNEE ARTHROPLASTY Right 2009   TRANSTHORACIC ECHOCARDIOGRAM  1/'18; 1/'19   a) 1/'18: mild conc LVH. EF 45-50% - anteroseptal & inferoseptal HK. Mod AI.; b) 1/'19 - post PCI: (post PCI) --> EF normalized:  Normal LV systolic function; mild diastolic dysfunction; mild   LVH; sclerotic aortic valve with mild AI.  No RWMA   TRANSTHORACIC ECHOCARDIOGRAM  06/04/2020    for TIA: Normal LV size and function.  No or WMA.  EF 60 to 65%.  GR 1 DD with elevated LAP.  Normal RV size and function.  Normal  RVP, RAP.  Mild aortic valve calcification-sclerosis but no stenosis.  Trivial AI.  No embolic source   PCI [name] 2018 -mid LCx 90% - 0% with Synergy DES 2.75 mm right 60 mm; LAD FFR negative.     MEDICATIONS/ALLERGIES   Current Meds  Medication Sig   acetaminophen (TYLENOL) 500 MG tablet Take 1,000 mg by mouth 2 (two) times daily as needed for moderate pain or headache.    albuterol (VENTOLIN HFA) 108 (90 Base) MCG/ACT inhaler INHALE 1 PUFF INTO THE LUNGS EVERY 6 (SIX) HOURS AS NEEDED (FOR COUGH).   atorvastatin (LIPITOR) 40 MG tablet Take 1 tablet (40 mg total) by mouth daily.   benzonatate (TESSALON) 200 MG capsule TAKE 1 CAPSULE (200 MG TOTAL) BY MOUTH 3 (THREE) TIMES DAILY AS NEEDED FOR COUGH.   busPIRone (BUSPAR) 15 MG tablet TAKE 1 TABLET BY MOUTH EVERY DAY   cetirizine (ZYRTEC) 10 MG tablet TAKE 1 TABLET BY MOUTH EVERY DAY (Patient taking differently: Take 10 mg by mouth daily.)   clopidogrel (PLAVIX) 75 MG tablet Take 1 tablet (75 mg total) by mouth daily.   fluticasone (FLONASE) 50 MCG/ACT nasal spray PLACE 1 SPRAY INTO BOTH NOSTRILS DAILY AS NEEDED.   HYDROcodone-acetaminophen (NORCO/VICODIN) 5-325 MG tablet Take 1 tablet by mouth 3 (three) times daily as needed for moderate pain.   Melatonin 3 MG CAPS Take 6 mg by mouth at bedtime.    methocarbamol (ROBAXIN) 500 MG tablet TAKE 1 TABLET BY MOUTH EVERY 8 HOURS AS NEEDED FOR MUSCLE SPASMS   metoprolol tartrate (LOPRESSOR) 25 MG tablet Take 1.5  tablet (37.5 mg total) by mouth 2 (two) times daily.   nitroGLYCERIN (NITROSTAT) 0.4 MG SL tablet Place 1 tablet (0.4 mg total)  under the tongue every 5 (five) minutes as needed.   nystatin (MYCOSTATIN/NYSTOP) powder Apply topically 2 (two) times daily as needed. To affected areas to prevent yeast (in skin folds)   pantoprazole (PROTONIX) 40 MG tablet TAKE 1 TABLET BY MOUTH EVERY DAY   sertraline (ZOLOFT) 25 MG tablet TAKE 1 TABLET (25 MG TOTAL) BY MOUTH DAILY.   Vitamin D, Cholecalciferol,  25 MCG (1000 UT) CAPS Take 2,000 Units by mouth in the morning and at bedtime.    [Refilled] losartan (COZAAR) 50 MG tablet Take 1 tablet (50 mg total) by mouth daily.    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   Ace Inhibitors     cough   Hydrochlorothiazide     Hyponatremia    Oxaprozin     GI upset    Pregabalin Swelling    SWELLING REACTION UNSPECIFIED    Amoxicillin-Pot Clavulanate Nausea And Vomiting    gi upset Has patient had a PCN reaction causing immediate rash, facial/tongue/throat swelling, SOB or lightheadedness with hypotension: No Has patient had a PCN reaction causing severe rash involving mucus membranes or skin necrosis: No Has patient had a PCN reaction that required hospitalization No Has patient had a PCN reaction occurring within the last 10 years: No If all of the above answers are "NO", then may proceed with Cephalosporin use.     SOCIAL HISTORY/FAMILY HISTORY   Reviewed in Epic:  Pertinent findings: Rare margarita but otherwise no smoking or drinking.  Social History   Social History Narrative   Now Widowed as of [name] 2020. Birthed 5 children. Lost one at age 46.   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   9 grandchildren     OBJCTIVE -PE, EKG, labs   Wt Readings from Last 3 Encounters:  01/25/23 171 lb 9.6 oz (77.8 kg)  01/13/23 173 lb 3.2 oz (78.6 kg)  12/23/22 173 lb 1 oz (78.5 kg)    Physical Exam: BP (!) 140/70 (BP Location: Left Arm, Patient Position: Sitting, Cuff Size: Normal)   Pulse (!) 56   Ht 5\' 2"  (1.575 m)   Wt 171 lb 9.6 oz (77.8 kg)   SpO2 98%   BMI 31.39 kg/m  Physical Exam Vitals reviewed.  Constitutional:      General: She is not in acute distress.    Appearance: Normal appearance. She  is obese. She is not ill-appearing or toxic-appearing.     Comments: Mildly obese but otherwise well-nourished well-groomed.  HENT:     Head: Normocephalic and atraumatic.  Neck:     Vascular: No carotid bruit.  Cardiovascular:     Pulses: Normal pulses.     Heart sounds: Murmur (High-pitched 1/6 SEM at RUSB-neck.) heard.     No friction rub. No gallop.  Pulmonary:     Effort: Pulmonary effort is normal. No respiratory distress.     Breath sounds: Normal breath sounds. No wheezing, rhonchi or rales.  Musculoskeletal:        General: Swelling (Bilateral 1+) present.     Cervical back: Normal range of motion and neck supple.     Right lower leg: Edema present.     Left lower leg: Edema present.  Skin:  General: Skin is warm and dry.  Neurological:     General: No focal deficit present.     Mental Status: She is alert and oriented to person, place, and time.     Gait: Gait abnormal.  Psychiatric:        Mood and Affect: Mood normal.        Behavior: Behavior normal.        Thought Content: Thought content normal.        Judgment: Judgment normal.     Adult ECG Report  Rate: 56;  Rhythm: sinus bradycardia and left axis deviation.  Cannot rule out anterior. ;   Narrative Interpretation: Stable  Recent Labs: Reviewed Lab Results  Component Value Date   CHOL 149 12/23/2022   HDL 42 12/23/2022   LDLCALC 76 12/23/2022   LDLDIRECT 75.0 12/02/2021   TRIG 154 (H) 12/23/2022   CHOLHDL 3.5 12/23/2022   Lab Results  Component Value Date   CREATININE 1.04 (H) 01/13/2023   BUN 20 01/13/2023   NA 138 01/13/2023   K 4.2 01/13/2023   CL 105 01/13/2023   CO2 27 01/13/2023      Latest Ref Rng & Units 01/13/2023    9:32 AM 12/24/2022    7:04 AM 12/23/2022    4:36 AM  CBC  WBC 4.0 - 10.5 K/uL 6.8  6.8  11.8   Hemoglobin 12.0 - 15.0 g/dL 91.4  78.2  95.6   Hematocrit 36.0 - 46.0 % 33.5  33.0  33.4   Platelets 150 - 400 K/uL 209  225  219     Lab Results  Component Value Date    HGBA1C 5.7 12/17/2022   Lab Results  Component Value Date   TSH 2.419 12/22/2022    ================================================== I spent a total of 26 minutes with the patient spent in direct patient consultation.  Additional time spent with chart review  / charting (studies, outside notes, etc): 18 min Total Time: 44 min  Current medicines are reviewed at length with the patient today.  (+/- concerns) none  Notice: This dictation was prepared with Dragon dictation along with smart phrase technology. Any transcriptional errors that result from this process are unintentional and may not be corrected upon review.  Studies Ordered:   Orders Placed This Encounter  Procedures   EKG 12-Lead   Meds ordered this encounter  Medications   metoprolol tartrate (LOPRESSOR) 25 MG tablet    Sig: Take 1 tablet (25 mg total) by mouth 2 (two) times daily.    Dispense:  90 tablet    Refill:  0    ok to change to 25mg  tabs, back to twice a day ( 01/25/24) per Dr Herbie Baltimore   losartan (COZAAR) 50 MG tablet    Sig: Take 1 tablet (50 mg total) by mouth daily. May take an additional 50 mg tablet if blood pressure is greater than or equal to 160 systolic    Dispense:  30 tablet    Refill:  6    Patient Instructions / Medication Changes & Studies & Tests Ordered   Patient Instructions  Medication Instructions:   Switch back to Metoprolol tartrate 25 mg  twice a day    If blood pressure sustains greater than or equal to 160  take an extra dose of Losartan  50 mg   *If you need a refill on your cardiac medications before your next appointment, please call your pharmacy*   Lab Work: Not needed  Testing/Procedures:  Not needed  Follow-Up: At [facility] you and your health needs are our priority.  As part of our continuing mission to provide you with exceptional heart care, we have created designated Provider Care Teams.  These Care Teams include your primary Cardiologist (physician)  and Advanced Practice Providers (APPs -  Physician Assistants and Nurse Practitioners) who all work together to provide you with the care you need, when you need it.     Your next appointment:   6 month(s)  either [facility] or [place]   The format for your next appointment:   In Person  Provider:   Bryan Lemma, MD        Marykay Lex, MD, MS Bryan Lemma, M.D., M.S. Interventional Cardiologist  [facility]  Pager # [phone] Phone # [phone] [address] [place], [place] [address]   Thank you for choosing [facility] at [place]!!

## 2023-01-25 NOTE — Patient Instructions (Addendum)
Medication Instructions:   Switch back to Metoprolol tartrate 25 mg  twice a day    If blood pressure sustains greater than or equal to 160  take an extra dose of Losartan  50 mg   *If you need a refill on your cardiac medications before your next appointment, please call your pharmacy*   Lab Work: Not needed    Testing/Procedures:  Not needed  Follow-Up: At [facility], you and your health needs are our priority.  As part of our continuing mission to provide you with exceptional heart care, we have created designated Provider Care Teams.  These Care Teams include your primary Cardiologist (physician) and Advanced Practice Providers (APPs -  Physician Assistants and Nurse Practitioners) who all work together to provide you with the care you need, when you need it.     Your next appointment:   6 month(s)  either [facility] or [place]   The format for your next appointment:   In Person  Provider:   Bryan Lemma, MD

## 2023-02-02 ENCOUNTER — Encounter: Payer: Self-pay | Admitting: Cardiology

## 2023-02-02 DIAGNOSIS — M25562 Pain in left knee: Secondary | ICD-10-CM | POA: Diagnosis not present

## 2023-02-02 DIAGNOSIS — M25511 Pain in right shoulder: Secondary | ICD-10-CM | POA: Diagnosis not present

## 2023-02-02 NOTE — Assessment & Plan Note (Signed)
Also pretty stable.   Discussed fibrillation.  Trying to avoid diuretic

## 2023-02-02 NOTE — Assessment & Plan Note (Signed)
Recently increased Lipitor to 40 mg.  Should be due for labs rechecked soon  Low threshold to consider adding Zetia.  I am just very reluctant to increase the extent of her polypharmacy.

## 2023-02-02 NOTE — Assessment & Plan Note (Signed)
Pretty much stable meds-now on Plavix monotherapy being managed by neurology.  Symptoms seem to have resolved.

## 2023-02-02 NOTE — Assessment & Plan Note (Signed)
I think this is more based on deconditioning. Not really complaining that today.  I think she is just deconditioned.

## 2023-02-02 NOTE — Assessment & Plan Note (Signed)
Longstanding issue.  I think it would be best for her to simply be on low-dose beta-blocker.  Will reduce her back to 25 milligram twice daily

## 2023-02-02 NOTE — Assessment & Plan Note (Signed)
Asymptomatic.  Stable.  Nonischemic Myoview on first evaluation.

## 2023-02-02 NOTE — Assessment & Plan Note (Signed)
With pretty labile blood pressures I would prefer for her to have permissive hypertension.  If he has persistent blood pressure greater than 160 mmHg (Accu-Cheks) can take an additional 50 mg losartan otherwise discontinue current meds.

## 2023-02-02 NOTE — Assessment & Plan Note (Signed)
No active angina. Now back on Plavix for stroke.  Of note, decisions about holding Plavix are not up to me they are up to neurology.  Plan: Continue Plavix for now, but when switched off of Plavix, would go back to aspirin 81 mg. On Lopressor which I think we can reduce back down to 25 mg twice daily because of bradycardia. Continue losartan 50 mg daily. Continue atorvastatin 40 mg daily.  She is tolerating well.

## 2023-02-02 NOTE — Assessment & Plan Note (Signed)
Still no angina.  Back on Plavix.  Reducing dose of metoprolol back to 25 mg twice daily and continuing losartan 50 mg daily. Continue 40 mg atorvastatin

## 2023-02-04 DIAGNOSIS — M5459 Other low back pain: Secondary | ICD-10-CM | POA: Diagnosis not present

## 2023-02-04 DIAGNOSIS — Z79899 Other long term (current) drug therapy: Secondary | ICD-10-CM | POA: Diagnosis not present

## 2023-02-04 DIAGNOSIS — Z5181 Encounter for therapeutic drug level monitoring: Secondary | ICD-10-CM | POA: Diagnosis not present

## 2023-02-04 DIAGNOSIS — G894 Chronic pain syndrome: Secondary | ICD-10-CM | POA: Diagnosis not present

## 2023-02-04 DIAGNOSIS — Z79891 Long term (current) use of opiate analgesic: Secondary | ICD-10-CM | POA: Diagnosis not present

## 2023-02-04 DIAGNOSIS — M5136 Other intervertebral disc degeneration, lumbar region: Secondary | ICD-10-CM | POA: Diagnosis not present

## 2023-02-27 ENCOUNTER — Other Ambulatory Visit: Payer: Self-pay | Admitting: Family Medicine

## 2023-03-01 NOTE — Telephone Encounter (Signed)
Will route to cardiology, 50 mg is on pt's med list but it is a "no print" Rx so not sure what dose pt should be on so will let cardiology review

## 2023-03-02 ENCOUNTER — Other Ambulatory Visit: Payer: Self-pay | Admitting: Family Medicine

## 2023-03-02 DIAGNOSIS — M1712 Unilateral primary osteoarthritis, left knee: Secondary | ICD-10-CM | POA: Diagnosis not present

## 2023-03-02 NOTE — Telephone Encounter (Signed)
Last filled on 01/22/23 #90 tab/ 0 refill, pt has CPE scheduled for next year on 12/23/23, last CPE was 12/22/22

## 2023-03-08 NOTE — Progress Notes (Unsigned)
[facility] [address] [place]. [place] [address] [phone]       HOSPITAL FOLLOW UP NOTE  [name] Date of Birth:  1940/09/15 Medical Record Number:  [id number]   Reason for Referral:  hospital stroke follow up    SUBJECTIVE:   CHIEF COMPLAINT:  No chief complaint on file.   HPI:   [name] is a 82 y.o. female with history of CAD status post stenting, hypertension, hyperlipidemia, psoriasis, and TIA in 2021 who presented to ED on 12/22/2022 with nausea, lightheadedness, speech difficulty and slowness with significantly elevated BP.  Stroke workup unremarkable.  Felt symptoms in setting of TIA vs hypertensive encephalopathy vs medication effect (symptoms started shortly after taking pain medication).  Recommended DAPT for 3 weeks then Plavix alone and continuation of atorvastatin 40 mg daily.  Adjustments made to blood pressure regimen and advised PCP follow-up outpatient for further monitoring/management.        PERTINENT IMAGING  CT no showed abnormality CT head and neck unremarkable MRI no acute 2D Echo EF 60 to 65% LDL 76 HgbA1c 5.7 UDS positive for opioids    ROS:   14 system review of systems performed and negative with exception of ***  PMH:  Past Medical History:  Diagnosis Date   Anemia    of chronic disease   Anxiety    Arthritis    "psoratic arthritis; new dx" (09/11/2016)   CAD S/P percutaneous coronary angioplasty 08/2016   mLCx 90% -> 0% (2.75 mm x 16 mm) Synergy DES PCI Ostial D1 70% (not PTCA target). Moderate ostial and mD2.  LAD tandem 50% stenoses w/ negative FFR (0.86)   Cancer (HCC)    skin cancer on leg   Chronic lower back pain    Complication of anesthesia 1980 and 1982   trouble waking up after breast biopsy, many surgeries since no problem   Depression    Dyspnea on effort - CHRONIC    partial relief of Sx post PCI of L Circumflex.   Fibrocystic breast    GERD (gastroesophageal  reflux disease)    Headache    "monthly" (09/11/2016)   History of blood transfusion    "several since age 89; I've had them after most of my surgeries" (09/11/2016)   History of myocardial infarction     - Myoview [name] 2018 indicated small Inferior - Inferoseptal Infarct (vs. rest ischemia). Echo with inferoseptal hypokinesis.   Hx of skin cancer, basal cell 2013 and 2012   right inner, lower leg; several scattered around my body   Hypertension    Iron deficiency anemia    "I've had an iron infusion"   Osteoarthritis    Osteopenia    Peptic ulcer disease    H pylori   PMR (polymyalgia rheumatica) (HCC)    PONV (postoperative nausea and vomiting)    Psoriasis    TIA (transient ischemic attack) 06/03/2020   Symptoms of right arm weakness/numbness and slurred speech as well as right droopm => MRI brain/MRA head normal.  Normal echo normal carotid Dopplers.    PSH:  Past Surgical History:  Procedure Laterality Date   APPENDECTOMY     BACK SURGERY     x 6    BREAST BIOPSY Left 1980 and 1982   "benign"   CARDIAC CATHETERIZATION N/A 09/11/2016   Procedure: Left Heart Cath and Coronary Angiography;  Surgeon: Marykay Lex, MD;  Location: [facility] INVASIVE CV LAB;  Service: Cardiovascular: mLCx 90% * (  PCI). Ostial D1 70% (not PTCA target). Mod ostial and mD2.  LAD tandem 50% stenoses w/ negative FFR (0.86)   CARDIAC CATHETERIZATION N/A 09/11/2016   Procedure: Intravascular Pressure Wire/FFR Study;  Surgeon: Marykay Lex, MD;  Location: [facility] INVASIVE CV LAB;  Service: Cardiovascular: --  LAD tandem 50% stenoses w/ negative FFR (0.86   CARDIAC CATHETERIZATION N/A 09/11/2016   Procedure: Coronary Stent Intervention;  Surgeon: Marykay Lex, MD;  Location: [facility] INVASIVE CV LAB;  Service: Cardiovascular: -- DES PCI mLCx 90%--0% SYNERGY DES 2.75 MM X 16 MM.   CARDIAC CATHETERIZATION  2001   non-obstructive CAD   CATARACT EXTRACTION W/ INTRAOCULAR LENS  IMPLANT, BILATERAL  July -  August 2017    DILATION AND CURETTAGE OF UTERUS  X 3   ESOPHAGOGASTRODUODENOSCOPY  03/2009   ulcer,HP, GERD stricture   EXCISIONAL HEMORRHOIDECTOMY     HIP ARTHROPLASTY Left 2010   JOINT REPLACEMENT     KNEE ARTHROSCOPY Bilateral    LAPAROSCOPIC CHOLECYSTECTOMY  2005   LUMBAR DISC SURGERY  11/1984; 1987; 1989; 2004; 2005 X 2   deg. disk lumber spine    MOHS SURGERY Right    "inside my lower leg"   NASAL SINUS SURGERY  1970s   NM MYOVIEW LTD  08/28/2016   INTERMEDIATE RISK (b/c reduced EF & ? small infarct) -- EF ~35-40%.  ? Prior Small Inferior-apical & septal infarct (w/o ischemia).  Difuse HK - worse in inferoapex.  Suggest prior infarct with no ischemia. Ischemic area correlates with echo WMA   NM MYOVIEW LTD  05/27/2021   Findings consistent with prior anteroapical infarct with fixed defect.  No peri-infarct ischemia.  Notable improvement from prior study.:   ORIF FEMUR FRACTURE Right 01/04/2020   Procedure: OPEN REDUCTION INTERNAL FIXATION (ORIF) DISTAL FEMUR FRACTURE;  Surgeon: Samson Frederic, MD;  Location: [facility] OR;  Service: Orthopedics;  Laterality: Right;   REVERSE SHOULDER ARTHROPLASTY Left 12/17/2017   Procedure: LEFT REVERSE SHOULDER ARTHROPLASTY;  Surgeon: Beverely Low, MD;  Location: [facility] OR;  Service: Orthopedics;  Laterality: Left;   TOTAL HIP ARTHROPLASTY Right 04/04/2013   Procedure: RIGHT TOTAL HIP ARTHROPLASTY ANTERIOR APPROACH;  Surgeon: Shelda Pal, MD;  Location: [facility] ORS;  Service: Orthopedics;  Laterality: Right;   TOTAL KNEE ARTHROPLASTY Right 2009   TRANSTHORACIC ECHOCARDIOGRAM  1/'18; 1/'19   a) 1/'18: mild conc LVH. EF 45-50% - anteroseptal & inferoseptal HK. Mod AI.; b) 1/'19 - post PCI: (post PCI) --> EF normalized:  Normal LV systolic function; mild diastolic dysfunction; mild   LVH; sclerotic aortic valve with mild AI.  No RWMA   TRANSTHORACIC ECHOCARDIOGRAM  06/04/2020    for TIA: Normal LV size and function.  No or WMA.  EF 60 to 65%.  GR 1 DD with elevated LAP.  Normal  RV size and function.  Normal RVP, RAP.  Mild aortic valve calcification-sclerosis but no stenosis.  Trivial AI.  No embolic source   TUBAL LIGATION     VAGINAL HYSTERECTOMY  1970s   partial     Social History:  Social History   Socioeconomic History   Marital status: Widowed    Spouse name: Not on file   Number of children: 5   Years of education: Not on file   Highest education level: Not on file  Occupational History   Not on file  Tobacco Use   Smoking status: Never    Passive exposure: Never   Smokeless tobacco: Never  Vaping Use  Vaping status: Never Used  Substance and Sexual Activity   Alcohol use: Yes    Alcohol/week: 0.0 standard drinks of alcohol    Comment: 09/11/2016 "might have a margarita q 3-4 months"   Drug use: No   Sexual activity: Not Currently  Other Topics Concern   Not on file  Social History Narrative   Now Widowed as of [name] 2020. Birthed 5 children. Lost one at age 34.   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   9 grandchildren    Social Determinants of Health   Financial Resource Strain: Low Risk  (12/09/2022)   Overall Financial Resource Strain (CARDIA)    Difficulty of Paying Living Expenses: Not hard at all  Food Insecurity: No Food Insecurity (12/23/2022)   Hunger Vital Sign    Worried About Running Out of Food in the Last Year: Never true    Ran Out of Food in the Last Year: Never true  Transportation Needs: No Transportation Needs (12/23/2022)   PRAPARE - Administrator, Civil Service (Medical): No    Lack of Transportation (Non-Medical): No  Physical Activity: Inactive (12/09/2022)   Exercise Vital Sign    Days of Exercise per Week: 0 days    Minutes of Exercise per Session: 0 min  Stress: No Stress Concern Present (12/09/2022)   [employer] of Occupational  Health - Occupational Stress Questionnaire    Feeling of Stress : Not at all  Social Connections: Moderately Isolated (12/09/2022)   Social Connection and Isolation Panel [NHANES]    Frequency of Communication with Friends and Family: More than three times a week    Frequency of Social Gatherings with Friends and Family: More than three times a week    Attends Religious Services: More than 4 times per year    Active Member of [employer] or Organizations: No    Attends Banker Meetings: Never    Marital Status: Widowed  Intimate Partner Violence: Not At Risk (12/23/2022)   Humiliation, Afraid, Rape, and Kick questionnaire    Fear of Current or Ex-Partner: No    Emotionally Abused: No    Physically Abused: No    Sexually Abused: No    Family History:  Family History  Problem Relation Age of Onset   Arthritis Mother    Emphysema Mother    Hyperlipidemia Mother    Hypertension Mother    Heart disease Mother    Heart disease Brother        CAD   Diabetes Paternal Grandmother    Heart disease Maternal Aunt     Medications:   Current Outpatient Medications on File Prior to Visit  Medication Sig Dispense Refill   acetaminophen (TYLENOL) 500 MG tablet Take 1,000 mg by mouth 2 (two) times daily as needed for moderate pain or headache.      albuterol (VENTOLIN HFA) 108 (90 Base) MCG/ACT inhaler INHALE 1 PUFF INTO THE LUNGS EVERY 6 (SIX) HOURS AS NEEDED (FOR COUGH). 6.7 each 1   atorvastatin (LIPITOR) 40 MG tablet Take 1 tablet (40 mg total) by mouth daily. 90 tablet 3   benzonatate (TESSALON) 200 MG capsule TAKE 1 CAPSULE (200 MG TOTAL) BY MOUTH 3 (THREE) TIMES DAILY AS NEEDED FOR COUGH. 90 capsule 1   busPIRone (  BUSPAR) 15 MG tablet TAKE 1 TABLET BY MOUTH EVERY DAY 90 tablet 3   cetirizine (ZYRTEC) 10 MG tablet TAKE 1 TABLET BY MOUTH EVERY DAY (Patient taking differently: Take 10 mg by mouth daily.) 90 tablet 1   clopidogrel (PLAVIX) 75 MG tablet Take 1 tablet (75 mg total) by  mouth daily. 30 tablet 5   fluticasone (FLONASE) 50 MCG/ACT nasal spray PLACE 1 SPRAY INTO BOTH NOSTRILS DAILY AS NEEDED. 48 mL 1   HYDROcodone-acetaminophen (NORCO/VICODIN) 5-325 MG tablet Take 1 tablet by mouth 3 (three) times daily as needed for moderate pain. 30 tablet 0   losartan (COZAAR) 50 MG tablet Take 1 tablet (50 mg total) by mouth daily. May take an additional 50 mg tablet if blood pressure is greater than or equal to 160 systolic 30 tablet 6   Melatonin 3 MG CAPS Take 6 mg by mouth at bedtime.      methocarbamol (ROBAXIN) 500 MG tablet TAKE 1 TABLET BY MOUTH EVERY 8 HOURS AS NEEDED FOR MUSCLE SPASM 90 tablet 2   metoprolol tartrate (LOPRESSOR) 25 MG tablet Take 1 tablet (25 mg total) by mouth 2 (two) times daily. 90 tablet 0   nitroGLYCERIN (NITROSTAT) 0.4 MG SL tablet Place 1 tablet (0.4 mg total) under the tongue every 5 (five) minutes as needed. 25 tablet 3   nystatin (MYCOSTATIN/NYSTOP) powder Apply topically 2 (two) times daily as needed. To affected areas to prevent yeast (in skin folds) 30 g 1   pantoprazole (PROTONIX) 40 MG tablet TAKE 1 TABLET BY MOUTH EVERY DAY 90 tablet 3   sertraline (ZOLOFT) 25 MG tablet TAKE 1 TABLET (25 MG TOTAL) BY MOUTH DAILY. 90 tablet 0   Vitamin D, Cholecalciferol, 25 MCG (1000 UT) CAPS Take 2,000 Units by mouth in the morning and at bedtime.      No current facility-administered medications on file prior to visit.    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   Ace Inhibitors     cough   Hydrochlorothiazide     Hyponatremia    Oxaprozin     GI upset    Pregabalin Swelling    SWELLING REACTION UNSPECIFIED    Amoxicillin-Pot Clavulanate Nausea And Vomiting    gi upset Has patient had a PCN reaction causing immediate rash, facial/tongue/throat swelling, SOB or lightheadedness with hypotension: No Has patient had a PCN reaction causing severe rash involving  mucus membranes or skin necrosis: No Has patient had a PCN reaction that required hospitalization No Has patient had a PCN reaction occurring within the last 10 years: No If all of the above answers are "NO", then may proceed with Cephalosporin use.       OBJECTIVE:  Physical Exam  There were no vitals filed for this visit. There is no height or weight on file to calculate BMI. No results found.     12/09/2022    9:22 AM  Depression screen PHQ 2/9  Decreased Interest 0  Down, Depressed, Hopeless 0  PHQ - 2 Score 0     General: well developed, well nourished, seated, in no evident distress Head: head normocephalic and atraumatic.   Neck: supple with no carotid or supraclavicular bruits Cardiovascular: regular rate and rhythm, no murmurs Musculoskeletal: no deformity Skin:  no rash/petichiae Vascular:  Normal pulses all extremities   Neurologic Exam Mental Status: Awake and fully alert. Oriented to place and time. Recent and  remote memory intact. Attention span, concentration and fund of knowledge appropriate. Mood and affect appropriate.  Cranial Nerves: Fundoscopic exam reveals sharp disc margins. Pupils equal, briskly reactive to light. Extraocular movements full without nystagmus. Visual fields full to confrontation. Hearing intact. Facial sensation intact. Face, tongue, palate moves normally and symmetrically.  Motor: Normal bulk and tone. Normal strength in all tested extremity muscles Sensory.: intact to touch , pinprick , position and vibratory sensation.  Coordination: Rapid alternating movements normal in all extremities. Finger-to-nose and heel-to-shin performed accurately bilaterally. Gait and Station: Arises from chair without difficulty. Stance is normal. Gait demonstrates normal stride length and balance with ***. Tandem walk and heel toe ***.  Reflexes: 1+ and symmetric. Toes downgoing.     NIHSS  *** Modified Rankin  ***      ASSESSMENT: KADI HESSION is a 82 y.o. year old female who presented with nausea, lightheadedness, speech difficulty and slowness with significantly elevated blood pressure on 12/22/2022 possibly in setting of TIA vs hypertensive encephalopathy vs medication side effect as stroke work up unremarkable. Vascular risk factors include hx of TIA 2021, CAD s/p stenting, HTN, HLD and advanced age.      PLAN:  Strokelike episode:  Residual deficit: ***.  Continue Plavix and atorvastatin (Lipitor) for secondary stroke prevention.   Discussed secondary stroke prevention measures and importance of close PCP follow up for aggressive stroke risk factor management including BP goal<130/90, HLD with LDL goal<70 and DM with A1c.<7 .  Stroke labs 11/2022: LDL 76, A1c 5.7 I have gone over the pathophysiology of stroke, warning signs and symptoms, risk factors and their management in some detail with instructions to go to the closest emergency room for symptoms of concern.     Follow up in *** or call earlier if needed   CC:  [facility] provider: Dr. Pearlean Brownie PCP: Milinda Antis, Audrie Gallus, MD    I spent *** minutes of face-to-face and non-face-to-face time with patient.  This included previsit chart review including review of recent hospitalization, lab review, study review, order entry, electronic health record documentation, patient education regarding recent stroke including etiology, secondary stroke prevention measures and importance of managing stroke risk factors, residual deficits and typical recovery time and answered all other questions to patient satisfaction   Ihor Austin, AGNP-BC  [facility] [address] [place], [place] [address]  Phone [phone] Fax [phone] Note: This document was prepared with digital dictation and possible smart phrase technology. Any transcriptional errors that result from this process are unintentional.

## 2023-03-09 ENCOUNTER — Ambulatory Visit: Payer: Medicare Other | Admitting: Adult Health

## 2023-03-09 ENCOUNTER — Encounter: Payer: Self-pay | Admitting: Adult Health

## 2023-03-09 VITALS — BP 144/88 | HR 81 | Ht 62.0 in | Wt 169.0 lb

## 2023-03-09 DIAGNOSIS — Z8673 Personal history of transient ischemic attack (TIA), and cerebral infarction without residual deficits: Secondary | ICD-10-CM | POA: Diagnosis not present

## 2023-03-09 DIAGNOSIS — R2990 Unspecified symptoms and signs involving the nervous system: Secondary | ICD-10-CM | POA: Diagnosis not present

## 2023-03-09 NOTE — Telephone Encounter (Signed)
Per my last note:    Losartan Potassium 50 mg Oral Daily, May take an additional 50 mg tablet if blood pressure is greater than or equal to 160 systolic

## 2023-03-09 NOTE — Patient Instructions (Addendum)
Continue clopidogrel 75 mg daily  and atorvastatin  for secondary stroke prevention  Continue to follow up with PCP regarding cholesterol and blood pressure management  Maintain strict control of hypertension with blood pressure goal below 130/90 and cholesterol with LDL cholesterol (bad cholesterol) goal below 70 mg/dL.   Signs of a Stroke? Follow the BEFAST method:  Balance Watch for a sudden loss of balance, trouble with coordination or vertigo Eyes Is there a sudden loss of vision in one or both eyes? Or double vision?  Face: Ask the person to smile. Does one side of the face droop or is it numb?  Arms: Ask the person to raise both arms. Does one arm drift downward? Is there weakness or numbness of a leg? Speech: Ask the person to repeat a simple phrase. Does the speech sound slurred/strange? Is the person confused ? Time: If you observe any of these signs, call 911.       Thank you for coming to see [name] at [facility]. I hope we have been able to provide you high quality care today.  You may receive a patient satisfaction survey over the next few weeks. We would appreciate your feedback and comments so that we may continue to improve ourselves and the health of our patients.    Transient Ischemic Attack A transient ischemic attack (TIA) causes the same symptoms as a stroke, but the symptoms go away quickly. A TIA happens when blood flow to the brain is blocked. Having a TIA means you may be at risk for a stroke. A TIA is a medical emergency. What are the causes? A TIA is caused by a blocked artery in the head or neck. This means the brain does not get the blood supply it needs. A blockage can be caused by: Fatty buildup in an artery in the head or neck. A blood clot. A tear in an artery. Irritation and swelling (inflammation) of an artery. Sometimes the cause is not known. What increases the risk? Certain things may make you more likely to have a TIA. Some of  these are things that you can change, such as: Using products that have nicotine or tobacco. Not being active. Drinking too much alcohol. Using recreational drugs. Health conditions that may increase your risk include: High blood pressure. High cholesterol. Diabetes. Heart disease. A heartbeat that is not regular (atrial fibrillation). Sickle cell disease. Problems with blood clotting. Other risk factors include: Being over the age of 39. Being female. Being very overweight. Sleep problems (sleep apnea). Having a family history of stroke. Having had blood clots, stroke, TIA, or heart attack in the past. What are the signs or symptoms? The symptoms of a TIA are like those of a stroke. They can include: Weakness or loss of feeling in your face, arm, or leg. This often happens on one side of your body. Trouble walking. Trouble moving your arms or legs. Trouble talking or understanding what people are saying. Problems with how you see. Feeling dizzy. Feeling confused. Loss of balance or coordination. Feeling like you may vomit (nausea) or vomiting. Having a very bad headache. If you can, note what time you started to have symptoms. Tell your doctor. How is this treated? The goal of treatment is to lower the risk for a stroke. This may include: Changes to diet and lifestyle, such as getting regular exercise and stopping smoking. Taking medicines to: Thin the blood. Lower blood pressure. Lower cholesterol. Treating other health conditions, such as diabetes.  If testing shows that an artery in your brain is narrow, your doctor may recommend a procedure to: Take the blockage out of your artery. Open or widen an artery in your neck (carotid angioplasty and stenting). Follow these instructions at home: Medicines Take over-the-counter and prescription medicines only as told by your doctor. If you were told to take aspirin or another medicine to thin your blood, use it exactly as told  by your doctor. Taking too much of the medicine can cause bleeding. Taking too little of the medicine may not work to treat the problem. Eating and drinking  Eat 5 or more servings of fruits and vegetables each day. Follow instructions from your doctor about your diet. You may need to follow a certain diet to help lower your risk of a stroke. You may need to: Eat a diet that is low in fat and salt. Eat foods with a lot of fiber. Limit carbohydrates and sugar. If you drink alcohol: Limit how much you have to: 0-1 drink a day for women who are not pregnant. 0-2 drinks a day for men. Know how much alcohol is in a drink. In the [place] one drink equals one 12 oz bottle of beer (355 mL), one 5 oz glass of wine (148 mL), or one 1 oz glass of hard liquor (44 mL). General instructions Keep a healthy weight. Try to get at least 30 minutes of exercise on most days. Get treatment if you have sleep problems. Do not smoke or use any products that contain nicotine or tobacco. If you need help quitting, ask your doctor. Do not use drugs. Keep all follow-up visits. Your doctor will want to know if you have any more symptoms and to check blood labs if any medicines were prescribed. Where to find more information American Stroke Association: stroke.org Get help right away if: You have chest pain. You have a heartbeat that is not regular. You have any signs of a stroke. "BE FAST" is an easy way to remember the main warning signs: B - Balance. Dizziness, sudden trouble walking, or loss of balance. E - Eyes. Trouble seeing or a change in how you see. F - Face. Sudden weakness or loss of feeling of the face. The face or eyelid may droop on one side. A - Arms. Weakness or loss of feeling in an arm. This happens all of a sudden and most often on one side of the body. S - Speech. Sudden trouble speaking, slurred speech, or trouble understanding what people say. T - Time. Time to call emergency services. Write  down what time symptoms started. You have other signs of a stroke, such as: A sudden, very bad headache with no known cause. Feeling like you may vomit. Vomiting. A seizure. These symptoms may be an emergency. Get help right away. Call 911. Do not wait to see if the symptoms will go away. Do not drive yourself to the hospital. This information is not intended to replace advice given to you by your health care provider. Make sure you discuss any questions you have with your health care provider. Document Revised: 01/23/2022 Document Reviewed: 01/23/2022 Elsevier Patient Education  2024 [employer].

## 2023-03-23 ENCOUNTER — Other Ambulatory Visit: Payer: Self-pay | Admitting: Family Medicine

## 2023-03-24 DIAGNOSIS — Z85828 Personal history of other malignant neoplasm of skin: Secondary | ICD-10-CM | POA: Diagnosis not present

## 2023-03-24 DIAGNOSIS — C44729 Squamous cell carcinoma of skin of left lower limb, including hip: Secondary | ICD-10-CM | POA: Diagnosis not present

## 2023-03-24 DIAGNOSIS — L578 Other skin changes due to chronic exposure to nonionizing radiation: Secondary | ICD-10-CM | POA: Diagnosis not present

## 2023-03-24 DIAGNOSIS — L570 Actinic keratosis: Secondary | ICD-10-CM | POA: Diagnosis not present

## 2023-03-24 DIAGNOSIS — D225 Melanocytic nevi of trunk: Secondary | ICD-10-CM | POA: Diagnosis not present

## 2023-03-24 DIAGNOSIS — L281 Prurigo nodularis: Secondary | ICD-10-CM | POA: Diagnosis not present

## 2023-03-24 DIAGNOSIS — L814 Other melanin hyperpigmentation: Secondary | ICD-10-CM | POA: Diagnosis not present

## 2023-03-24 DIAGNOSIS — D0461 Carcinoma in situ of skin of right upper limb, including shoulder: Secondary | ICD-10-CM | POA: Diagnosis not present

## 2023-03-24 DIAGNOSIS — L821 Other seborrheic keratosis: Secondary | ICD-10-CM | POA: Diagnosis not present

## 2023-03-24 DIAGNOSIS — D485 Neoplasm of uncertain behavior of skin: Secondary | ICD-10-CM | POA: Diagnosis not present

## 2023-04-12 ENCOUNTER — Telehealth: Payer: Self-pay | Admitting: Hematology and Oncology

## 2023-04-12 NOTE — Telephone Encounter (Signed)
Cancelled and rescheduled appointments per room/resource and provider PAL. Patient is aware of the changes made to his upcoming appointment.

## 2023-04-14 ENCOUNTER — Other Ambulatory Visit: Payer: Self-pay

## 2023-04-14 DIAGNOSIS — D649 Anemia, unspecified: Secondary | ICD-10-CM

## 2023-04-14 DIAGNOSIS — D638 Anemia in other chronic diseases classified elsewhere: Secondary | ICD-10-CM

## 2023-04-15 ENCOUNTER — Inpatient Hospital Stay: Payer: Medicare Other | Admitting: Hematology and Oncology

## 2023-04-15 ENCOUNTER — Inpatient Hospital Stay (HOSPITAL_BASED_OUTPATIENT_CLINIC_OR_DEPARTMENT_OTHER): Payer: Medicare Other

## 2023-04-15 ENCOUNTER — Inpatient Hospital Stay: Payer: Medicare Other

## 2023-04-15 ENCOUNTER — Inpatient Hospital Stay: Payer: Medicare Other | Attending: Hematology and Oncology

## 2023-04-15 VITALS — BP 132/61 | HR 62 | Temp 98.2°F | Resp 18

## 2023-04-15 DIAGNOSIS — M353 Polymyalgia rheumatica: Secondary | ICD-10-CM | POA: Insufficient documentation

## 2023-04-15 DIAGNOSIS — D638 Anemia in other chronic diseases classified elsewhere: Secondary | ICD-10-CM | POA: Diagnosis not present

## 2023-04-15 DIAGNOSIS — D649 Anemia, unspecified: Secondary | ICD-10-CM

## 2023-04-15 LAB — CMP (CANCER CENTER ONLY)
ALT: 14 U/L (ref 0–44)
AST: 19 U/L (ref 15–41)
Albumin: 3.8 g/dL (ref 3.5–5.0)
Alkaline Phosphatase: 52 U/L (ref 38–126)
Anion gap: 7 (ref 5–15)
BUN: 20 mg/dL (ref 8–23)
CO2: 25 mmol/L (ref 22–32)
Calcium: 8.8 mg/dL — ABNORMAL LOW (ref 8.9–10.3)
Chloride: 104 mmol/L (ref 98–111)
Creatinine: 1.14 mg/dL — ABNORMAL HIGH (ref 0.44–1.00)
GFR, Estimated: 48 mL/min — ABNORMAL LOW (ref >60)
Glucose, Bld: 84 mg/dL (ref 70–99)
Potassium: 4.6 mmol/L (ref 3.5–5.1)
Sodium: 136 mmol/L (ref 135–145)
Total Bilirubin: 0.5 mg/dL (ref 0.3–1.2)
Total Protein: 6.8 g/dL (ref 6.5–8.1)

## 2023-04-15 LAB — CBC WITH DIFFERENTIAL (CANCER CENTER ONLY)
Abs Immature Granulocytes: 0.04 10*3/uL (ref 0.00–0.07)
Basophils Absolute: 0.1 10*3/uL (ref 0.0–0.1)
Basophils Relative: 1 %
Eosinophils Absolute: 0.1 10*3/uL (ref 0.0–0.5)
Eosinophils Relative: 1 %
HCT: 31.5 % — ABNORMAL LOW (ref 36.0–46.0)
Hemoglobin: 10.1 g/dL — ABNORMAL LOW (ref 12.0–15.0)
Immature Granulocytes: 1 %
Lymphocytes Relative: 28 %
Lymphs Abs: 1.7 10*3/uL (ref 0.7–4.0)
MCH: 29.5 pg (ref 26.0–34.0)
MCHC: 32.1 g/dL (ref 30.0–36.0)
MCV: 92.1 fL (ref 80.0–100.0)
Monocytes Absolute: 0.5 10*3/uL (ref 0.1–1.0)
Monocytes Relative: 9 %
Neutro Abs: 3.7 10*3/uL (ref 1.7–7.7)
Neutrophils Relative %: 60 %
Platelet Count: 265 10*3/uL (ref 150–400)
RBC: 3.42 MIL/uL — ABNORMAL LOW (ref 3.87–5.11)
RDW: 12.6 % (ref 11.5–15.5)
WBC Count: 6.1 10*3/uL (ref 4.0–10.5)
nRBC: 0 % (ref 0.0–0.2)

## 2023-04-15 LAB — FERRITIN: Ferritin: 70 ng/mL (ref 11–307)

## 2023-04-15 LAB — IRON AND IRON BINDING CAPACITY (CC-WL,HP ONLY)
Iron: 66 ug/dL (ref 28–170)
Saturation Ratios: 21 % (ref 10.4–31.8)
TIBC: 316 ug/dL (ref 250–450)
UIBC: 250 ug/dL (ref 148–442)

## 2023-04-15 MED ORDER — EPOETIN ALFA-EPBX 40000 UNIT/ML IJ SOLN
40000.0000 [IU] | Freq: Once | INTRAMUSCULAR | Status: AC
  Administered 2023-04-15: 40000 [IU] via SUBCUTANEOUS
  Filled 2023-04-15: qty 1

## 2023-04-19 DIAGNOSIS — C44729 Squamous cell carcinoma of skin of left lower limb, including hip: Secondary | ICD-10-CM | POA: Diagnosis not present

## 2023-04-19 DIAGNOSIS — L905 Scar conditions and fibrosis of skin: Secondary | ICD-10-CM | POA: Diagnosis not present

## 2023-04-29 DIAGNOSIS — D0461 Carcinoma in situ of skin of right upper limb, including shoulder: Secondary | ICD-10-CM | POA: Diagnosis not present

## 2023-04-29 DIAGNOSIS — L570 Actinic keratosis: Secondary | ICD-10-CM | POA: Diagnosis not present

## 2023-05-09 ENCOUNTER — Other Ambulatory Visit: Payer: Self-pay | Admitting: Cardiology

## 2023-05-12 ENCOUNTER — Telehealth: Payer: Self-pay | Admitting: Family Medicine

## 2023-05-12 ENCOUNTER — Other Ambulatory Visit: Payer: Self-pay | Admitting: Family Medicine

## 2023-05-12 DIAGNOSIS — R053 Chronic cough: Secondary | ICD-10-CM | POA: Insufficient documentation

## 2023-05-12 MED ORDER — BENZONATATE 200 MG PO CAPS: 200.0000 mg | ORAL_CAPSULE | Freq: Three times a day (TID) | ORAL | 1 refills | Status: DC | PRN

## 2023-05-12 NOTE — Telephone Encounter (Signed)
Is this for a chronic cough or is she sick?  What are symptoms?-please update and I will fill

## 2023-05-12 NOTE — Telephone Encounter (Signed)
Prescription Request  05/12/2023  LOV: 12/22/2022  What is the name of the medication or equipment? benzonatate (TESSALON) 200 MG capsule   Have you contacted your pharmacy to request a refill? No   Which pharmacy would you like this sent to?  [id number] [place], [place] - [address] [place] [address] Phone: [phone] Fax: [phone]    Patient notified that their request is being sent to the clinical staff for review and that they should receive a response within 2 business days.   Please advise at Mobile [phone] (mobile)

## 2023-05-12 NOTE — Telephone Encounter (Signed)
LOV: 12/22/2022 Pending OV: 12/23/2023 - CPE Medication Benzonatate 200mg  capsule Last Refill: 02/23/2022 by Dr. Karma Ganja: #[id number] with 1 refill

## 2023-05-12 NOTE — Telephone Encounter (Signed)
Addressed through refill request.

## 2023-05-12 NOTE — Telephone Encounter (Signed)
Pt said she isn't sick. She occasionally gets a cough (chronic). She said she stays with 3 different daughters and sometimes moving from home to home and the weather aggravates her chronic cough pt said she has no other sxs except the dry cough and the tessalon really help. Pt said she doesn't take them often she just ran out from the last time it was filled but she likes to keep it on hand for when her cough flares up.

## 2023-05-17 ENCOUNTER — Other Ambulatory Visit: Payer: Self-pay | Admitting: Family Medicine

## 2023-05-23 ENCOUNTER — Other Ambulatory Visit: Payer: Self-pay | Admitting: Cardiology

## 2023-05-27 DIAGNOSIS — M25811 Other specified joint disorders, right shoulder: Secondary | ICD-10-CM | POA: Diagnosis not present

## 2023-05-28 ENCOUNTER — Telehealth: Payer: Self-pay | Admitting: *Deleted

## 2023-05-28 ENCOUNTER — Telehealth: Payer: Self-pay | Admitting: Cardiology

## 2023-05-28 NOTE — Telephone Encounter (Signed)
Pre-operative Risk Assessment    Patient Name: [name] [name]  DOB: [birth year] MRN: [id number]  Last visit 01-25-23  Next visit 08-05-23      Request for Surgical Clearance    Procedure:   right reverse total shoulder arthroplasty  Date of Surgery:  Clearance TBD                                 Surgeon:  Dr. Malon Kindle Surgeon's Group or Practice Name:  [facility] Phone number:  [phone] [facility]. schedular  Fax number:  [phone]   Type of Clearance Requested:   - Medical  - Pharmacy:  Hold Clopidogrel (Plavix) hold not indicated   Type of Anesthesia:   choice   Additional requests/questions:    Sharen Hones   05/28/2023, 11:54 AM

## 2023-05-28 NOTE — Telephone Encounter (Signed)
Pt has been scheduled tele pre op appt 06/10/23. Med rec and consent are done.     Patient Consent for Virtual Visit        [name] [name] has provided verbal consent on 05/28/2023 for a virtual visit (video or telephone).   CONSENT FOR VIRTUAL VISIT FOR:  [name] [name]  By participating in this virtual visit I agree to the following:  I hereby voluntarily request, consent and authorize [facility] and its employed or contracted physicians, physician assistants, nurse practitioners or other licensed health care professionals (the Practitioner), to provide me with telemedicine health care services (the "Services") as deemed necessary by the treating Practitioner. I acknowledge and consent to receive the Services by the Practitioner via telemedicine. I understand that the telemedicine visit will involve communicating with the Practitioner through live audiovisual communication technology and the disclosure of certain medical information by electronic transmission. I acknowledge that I have been given the opportunity to request an in-person assessment or other available alternative prior to the telemedicine visit and am voluntarily participating in the telemedicine visit.  I understand that I have the right to withhold or withdraw my consent to the use of telemedicine in the course of my care at any time, without affecting my right to future care or treatment, and that the Practitioner or I may terminate the telemedicine visit at any time. I understand that I have the right to inspect all information obtained and/or recorded in the course of the telemedicine visit and may receive copies of available information for a reasonable fee.  I understand that some of the potential risks of receiving the Services via telemedicine include:  Delay or interruption in medical evaluation due to technological equipment failure or disruption; Information transmitted may not be sufficient (e.g. poor  resolution of images) to allow for appropriate medical decision making by the Practitioner; and/or  In rare instances, security protocols could fail, causing a breach of personal health information.  Furthermore, I acknowledge that it is my responsibility to provide information about my medical history, conditions and care that is complete and accurate to the best of my ability. I acknowledge that Practitioner's advice, recommendations, and/or decision may be based on factors not within their control, such as incomplete or inaccurate data provided by me or distortions of diagnostic images or specimens that may result from electronic transmissions. I understand that the practice of medicine is not an exact science and that Practitioner makes no warranties or guarantees regarding treatment outcomes. I acknowledge that a copy of this consent can be made available to me via my patient portal [facility] MyChart), or I can request a printed copy by calling the office of [facility].    I understand that my insurance will be billed for this visit.   I have read or had this consent read to me. I understand the contents of this consent, which adequately explains the benefits and risks of the Services being provided via telemedicine.  I have been provided ample opportunity to ask questions regarding this consent and the Services and have had my questions answered to my satisfaction. I give my informed consent for the services to be provided through the use of telemedicine in my medical care

## 2023-05-28 NOTE — Telephone Encounter (Signed)
Name: [name] [name]  DOB: 01/22/1941  MRN: [id number]  Primary Cardiologist: Bryan Lemma, MD   Preoperative team, please contact this patient and set up a phone call appointment for further preoperative risk assessment. Please obtain consent and complete medication review. Thank you for your help.  I confirm that guidance regarding antiplatelet and oral anticoagulation therapy has been completed and, if necessary, noted below.   (Dr. Elissa Hefty note from 01/25/2023 states that neurology is managing the Plavix but his name is on the refills)   Per office protocol, if patient is without any new symptoms or concerns at the time of their virtual visit, he/she may hold Plavix for 5 days prior to procedure. Please resume Plavix  as soon as possible postprocedure, at the discretion of the surgeon.   I also confirmed the patient resides in the state of [place]. As per [employer] telemedicine laws, the patient must reside in the state in which the provider is licensed.   Joni Reining, NP 05/28/2023, 12:43 PM [facility]

## 2023-05-28 NOTE — Telephone Encounter (Signed)
Pt has been scheduled tele pre op appt 06/10/23. Med rec and consent are done.

## 2023-06-04 ENCOUNTER — Telehealth: Payer: Self-pay | Admitting: *Deleted

## 2023-06-04 NOTE — Telephone Encounter (Signed)
[email or web address]

## 2023-06-04 NOTE — Telephone Encounter (Signed)
We received surgical clearance forms on pt. Per Dr. Milinda Antis pt needs an appt to check her out and check her BP. Please schedule an appt, thanks

## 2023-06-05 ENCOUNTER — Other Ambulatory Visit: Payer: Self-pay | Admitting: Cardiology

## 2023-06-07 ENCOUNTER — Other Ambulatory Visit: Payer: Self-pay | Admitting: Family Medicine

## 2023-06-09 ENCOUNTER — Telehealth: Payer: Self-pay | Admitting: Cardiology

## 2023-06-09 NOTE — Telephone Encounter (Signed)
Patient is requesting to speak with [name] [name] in regards to her tele visit for tomorrow. Patient states she has another appointment tomorrow. Please advise.

## 2023-06-09 NOTE — Telephone Encounter (Signed)
Appt is scheduled for 06/11/23

## 2023-06-09 NOTE — Telephone Encounter (Signed)
Spoke with patient who thought that she had an in office appointment tomorrow for pre-op clearance. I informed her that it was a telephone call and she verbalized understanding and said she would just keep her appointment because it wouldn't affect her other appointment.

## 2023-06-10 ENCOUNTER — Ambulatory Visit: Payer: Medicare Other | Attending: Physician Assistant

## 2023-06-10 DIAGNOSIS — G894 Chronic pain syndrome: Secondary | ICD-10-CM | POA: Diagnosis not present

## 2023-06-10 DIAGNOSIS — M5459 Other low back pain: Secondary | ICD-10-CM | POA: Diagnosis not present

## 2023-06-10 DIAGNOSIS — Z79899 Other long term (current) drug therapy: Secondary | ICD-10-CM | POA: Diagnosis not present

## 2023-06-10 DIAGNOSIS — Z79891 Long term (current) use of opiate analgesic: Secondary | ICD-10-CM | POA: Diagnosis not present

## 2023-06-10 DIAGNOSIS — Z01810 Encounter for preprocedural cardiovascular examination: Secondary | ICD-10-CM

## 2023-06-10 NOTE — Progress Notes (Signed)
Virtual Visit via Telephone Note   Because of [name] [name]'s co-morbid illnesses, she is at least at moderate risk for complications without adequate follow up.  This format is felt to be most appropriate for this patient at this time.  The patient did not have access to video technology/had technical difficulties with video requiring transitioning to audio format only (telephone).  All issues noted in this document were discussed and addressed.  No physical exam could be performed with this format.  Please refer to the patient's chart for her consent to telehealth for [facility].  Evaluation Performed:  Preoperative cardiovascular risk assessment _____________   Date:  06/10/2023   Patient ID:  [name] [name], DOB [birth year], MRN [id number] Patient Location:  Home Provider location:   Office  Primary Care Provider:  Judy Pimple, MD Primary Cardiologist:  Bryan Lemma, MD  Chief Complaint / Patient Profile   82 y.o. y/o female with a h/o hypertension, TIA, hyperlipidemia, CAD (chronic) status post PCI, chronic DOE, chronic fatigue, LBBB, chronic pedal edema who is pending right reverse total shoulder arthroplasty and presents today for telephonic preoperative cardiovascular risk assessment.  History of Present Illness    [name] [name] is a 82 y.o. female who presents via audio/video conferencing for a telehealth visit today.  Pt was last seen in cardiology clinic on 02/21/2023 by Dr. Herbie Baltimore.  At that time [name] [name] was doing well.  The patient is now pending procedure as outlined above. Since her last visit, she   Patient did not answer phone call x 2 and VM x 2. She will need to reschedule.   Per office protocol, if patient is without any new symptoms or concerns at the time of their virtual visit, he/she may hold Plavix for 5 days prior to procedure. Please resume Plavix  as soon as possible postprocedure, at the discretion of the surgeon.   Past Medical  History    Past Medical History:  Diagnosis Date   Anemia    of chronic disease   Anxiety    Arthritis    "psoratic arthritis; new dx" (09/11/2016)   CAD S/P percutaneous coronary angioplasty 08/2016   mLCx 90% -> 0% (2.75 mm x 16 mm) Synergy DES PCI Ostial D1 70% (not PTCA target). Moderate ostial and mD2.  LAD tandem 50% stenoses w/ negative FFR (0.86)   Cancer (HCC)    skin cancer on leg   Chronic lower back pain    Complication of anesthesia 1980 and 1982   trouble waking up after breast biopsy, many surgeries since no problem   Depression    Dyspnea on effort - CHRONIC    partial relief of Sx post PCI of L Circumflex.   Fibrocystic breast    GERD (gastroesophageal reflux disease)    Headache    "monthly" (09/11/2016)   History of blood transfusion    "several since age 52; I've had them after most of my surgeries" (09/11/2016)   History of myocardial infarction     - Myoview [name] 2018 indicated small Inferior - Inferoseptal Infarct (vs. rest ischemia). Echo with inferoseptal hypokinesis.   Hx of skin cancer, basal cell 2013 and 2012   right inner, lower leg; several scattered around my body   Hypertension    Iron deficiency anemia    "I've had an iron infusion"   Osteoarthritis    Osteopenia    Peptic ulcer disease    H pylori   PMR (polymyalgia  rheumatica) (HCC)    PONV (postoperative nausea and vomiting)    Psoriasis    TIA (transient ischemic attack) 06/03/2020   Symptoms of right arm weakness/numbness and slurred speech as well as right droopm => MRI brain/MRA head normal.  Normal echo normal carotid Dopplers.   Past Surgical History:  Procedure Laterality Date   APPENDECTOMY     BACK SURGERY     x 6    BREAST BIOPSY Left 1980 and 1982   "benign"   CARDIAC CATHETERIZATION N/A 09/11/2016   Procedure: Left Heart Cath and Coronary Angiography;  Surgeon: Marykay Lex, MD;  Location: [facility] INVASIVE CV LAB;  Service: Cardiovascular: mLCx 90% * (PCI). Ostial D1 70%  (not PTCA target). Mod ostial and mD2.  LAD tandem 50% stenoses w/ negative FFR (0.86)   CARDIAC CATHETERIZATION N/A 09/11/2016   Procedure: Intravascular Pressure Wire/FFR Study;  Surgeon: Marykay Lex, MD;  Location: [facility] INVASIVE CV LAB;  Service: Cardiovascular: --  LAD tandem 50% stenoses w/ negative FFR (0.86   CARDIAC CATHETERIZATION N/A 09/11/2016   Procedure: Coronary Stent Intervention;  Surgeon: Marykay Lex, MD;  Location: [facility] INVASIVE CV LAB;  Service: Cardiovascular: -- DES PCI mLCx 90%--0% SYNERGY DES 2.75 MM X 16 MM.   CARDIAC CATHETERIZATION  2001   non-obstructive CAD   CATARACT EXTRACTION W/ INTRAOCULAR LENS  IMPLANT, BILATERAL  July -  August 2017   DILATION AND CURETTAGE OF UTERUS  X 3   ESOPHAGOGASTRODUODENOSCOPY  03/2009   ulcer,HP, GERD stricture   EXCISIONAL HEMORRHOIDECTOMY     HIP ARTHROPLASTY Left 2010   JOINT REPLACEMENT     KNEE ARTHROSCOPY Bilateral    LAPAROSCOPIC CHOLECYSTECTOMY  2005   LUMBAR DISC SURGERY  11/1984; 1987; 1989; 2004; 2005 X 2   deg. disk lumber spine    MOHS SURGERY Right    "inside my lower leg"   NASAL SINUS SURGERY  1970s   NM MYOVIEW LTD  08/28/2016   INTERMEDIATE RISK (b/c reduced EF & ? small infarct) -- EF ~35-40%.  ? Prior Small Inferior-apical & septal infarct (w/o ischemia).  Difuse HK - worse in inferoapex.  Suggest prior infarct with no ischemia. Ischemic area correlates with echo WMA   NM MYOVIEW LTD  05/27/2021   Findings consistent with prior anteroapical infarct with fixed defect.  No peri-infarct ischemia.  Notable improvement from prior study.:   ORIF FEMUR FRACTURE Right 01/04/2020   Procedure: OPEN REDUCTION INTERNAL FIXATION (ORIF) DISTAL FEMUR FRACTURE;  Surgeon: Samson Frederic, MD;  Location: [facility] OR;  Service: Orthopedics;  Laterality: Right;   REVERSE SHOULDER ARTHROPLASTY Left 12/17/2017   Procedure: LEFT REVERSE SHOULDER ARTHROPLASTY;  Surgeon: Beverely Low, MD;  Location: [facility] OR;  Service: Orthopedics;   Laterality: Left;   TOTAL HIP ARTHROPLASTY Right 04/04/2013   Procedure: RIGHT TOTAL HIP ARTHROPLASTY ANTERIOR APPROACH;  Surgeon: Shelda Pal, MD;  Location: [facility] ORS;  Service: Orthopedics;  Laterality: Right;   TOTAL KNEE ARTHROPLASTY Right 2009   TRANSTHORACIC ECHOCARDIOGRAM  1/'18; 1/'19   a) 1/'18: mild conc LVH. EF 45-50% - anteroseptal & inferoseptal HK. Mod AI.; b) 1/'19 - post PCI: (post PCI) --> EF normalized:  Normal LV systolic function; mild diastolic dysfunction; mild   LVH; sclerotic aortic valve with mild AI.  No RWMA   TRANSTHORACIC ECHOCARDIOGRAM  06/04/2020    for TIA: Normal LV size and function.  No or WMA.  EF 60 to 65%.  GR 1 DD with elevated LAP.  Normal RV  size and function.  Normal RVP, RAP.  Mild aortic valve calcification-sclerosis but no stenosis.  Trivial AI.  No embolic source   TUBAL LIGATION     VAGINAL HYSTERECTOMY  1970s   partial     Allergies  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   Ace Inhibitors     cough   Hydrochlorothiazide     Hyponatremia    Oxaprozin     GI upset    Pregabalin Swelling    SWELLING REACTION UNSPECIFIED    Amoxicillin-Pot Clavulanate Nausea And Vomiting    gi upset Has patient had a PCN reaction causing immediate rash, facial/tongue/throat swelling, SOB or lightheadedness with hypotension: No Has patient had a PCN reaction causing severe rash involving mucus membranes or skin necrosis: No Has patient had a PCN reaction that required hospitalization No Has patient had a PCN reaction occurring within the last 10 years: No If all of the above answers are "NO", then may proceed with Cephalosporin use.     Home Medications    Prior to Admission medications   Medication Sig Start Date End Date Taking? Authorizing Provider  acetaminophen (TYLENOL) 500 MG tablet Take 1,000 mg by mouth 2 (two) times daily as needed for moderate pain or headache.      [provider]  albuterol (VENTOLIN HFA) 108 (90 Base) MCG/ACT inhaler INHALE 1 PUFF INTO THE LUNGS EVERY 6 (SIX) HOURS AS NEEDED (FOR COUGH). 02/04/21   Tower, Audrie Gallus, MD  atorvastatin (LIPITOR) 40 MG tablet Take 1 tablet (40 mg total) by mouth daily. 12/18/22 03/18/23  Marykay Lex, MD  benzonatate (TESSALON) 200 MG capsule Take 1 capsule (200 mg total) by mouth 3 (three) times daily as needed for cough. 05/12/23   Tower, Audrie Gallus, MD  busPIRone (BUSPAR) 15 MG tablet TAKE 1 TABLET BY MOUTH EVERY DAY 01/01/23   Tower, Audrie Gallus, MD  cetirizine (ZYRTEC) 10 MG tablet TAKE 1 TABLET BY MOUTH EVERY DAY Patient taking differently: Take 10 mg by mouth daily. 12/18/22   Tower, Audrie Gallus, MD  clopidogrel (PLAVIX) 75 MG tablet Take 1 tablet (75 mg total) by mouth daily. 01/20/23   Marykay Lex, MD  fluticasone (FLONASE) 50 MCG/ACT nasal spray PLACE 1 SPRAY INTO BOTH NOSTRILS DAILY AS NEEDED. 05/25/22   Tower, Audrie Gallus, MD  HYDROcodone-acetaminophen (NORCO/VICODIN) 5-325 MG tablet Take 1 tablet by mouth 3 (three) times daily as needed for moderate pain. 03/08/20   Serena Croissant, MD  losartan (COZAAR) 50 MG tablet TAKE 1 TABLET BY MOUTH EVERY DAY 05/26/23   Marykay Lex, MD  Melatonin 3 MG CAPS Take 6 mg by mouth at bedtime.     [provider]  methocarbamol (ROBAXIN) 500 MG tablet TAKE 1 TABLET BY MOUTH EVERY 8 HOURS AS NEEDED FOR MUSCLE SPASMS 06/07/23   Tower, Audrie Gallus, MD  metoprolol tartrate (LOPRESSOR) 25 MG tablet TAKE 1 TABLET BY MOUTH TWICE A DAY 05/10/23   Marykay Lex, MD  nitroGLYCERIN (NITROSTAT) 0.4 MG SL tablet Place 1 tablet (0.4 mg total) under the tongue every 5 (five) minutes as needed. 11/07/21 12/23/23  Marykay Lex, MD  nystatin (MYCOSTATIN/NYSTOP) powder Apply topically 2 (two) times daily as needed. To affected areas to prevent yeast (in skin folds) 07/23/20   Tower, Audrie Gallus, MD  pantoprazole (PROTONIX) 40 MG tablet TAKE 1 TABLET BY MOUTH EVERY DAY 06/07/23   Bryan Lemma  W, MD  sertraline (ZOLOFT) 25 MG tablet TAKE 1 TABLET (25 MG TOTAL) BY MOUTH DAILY. 03/23/23   Tower, Audrie Gallus, MD  Vitamin D, Cholecalciferol, 25 MCG (1000 UT) CAPS Take 2,000 Units by mouth in the morning and at bedtime.     [provider]    Physical Exam    Vital Signs:  JADRIAN SANTA does not have vital signs available for review today.  Given telephonic nature of communication, physical exam is limited. AAOx3. NAD. Normal affect.  Speech and respirations are unlabored.  Accessory Clinical Findings    None  Assessment & Plan    1.  Preoperative Cardiovascular Risk Assessment:  Not assessed today.   The patient was advised that if she develops new symptoms prior to surgery to contact our office to arrange for a follow-up visit, and she verbalized understanding.   A copy of this note will be routed to requesting surgeon.  Time:   Today, I have spent 0 minutes with the patient with telehealth technology discussing medical history, symptoms, and management plan.     Sharlene Dory, PA-C  06/10/2023, 11:55 AM

## 2023-06-11 ENCOUNTER — Ambulatory Visit (INDEPENDENT_AMBULATORY_CARE_PROVIDER_SITE_OTHER): Payer: Medicare Other | Admitting: Family Medicine

## 2023-06-11 ENCOUNTER — Encounter: Payer: Self-pay | Admitting: Family Medicine

## 2023-06-11 VITALS — BP 118/74 | HR 65 | Temp 98.2°F | Ht 62.0 in | Wt 172.5 lb

## 2023-06-11 DIAGNOSIS — D638 Anemia in other chronic diseases classified elsewhere: Secondary | ICD-10-CM | POA: Diagnosis not present

## 2023-06-11 DIAGNOSIS — R0609 Other forms of dyspnea: Secondary | ICD-10-CM

## 2023-06-11 DIAGNOSIS — E559 Vitamin D deficiency, unspecified: Secondary | ICD-10-CM | POA: Diagnosis not present

## 2023-06-11 DIAGNOSIS — E785 Hyperlipidemia, unspecified: Secondary | ICD-10-CM | POA: Diagnosis not present

## 2023-06-11 DIAGNOSIS — Z131 Encounter for screening for diabetes mellitus: Secondary | ICD-10-CM | POA: Diagnosis not present

## 2023-06-11 DIAGNOSIS — I10 Essential (primary) hypertension: Secondary | ICD-10-CM | POA: Diagnosis not present

## 2023-06-11 DIAGNOSIS — Z23 Encounter for immunization: Secondary | ICD-10-CM | POA: Diagnosis not present

## 2023-06-11 DIAGNOSIS — Z01818 Encounter for other preprocedural examination: Secondary | ICD-10-CM | POA: Diagnosis not present

## 2023-06-11 DIAGNOSIS — Z8673 Personal history of transient ischemic attack (TIA), and cerebral infarction without residual deficits: Secondary | ICD-10-CM

## 2023-06-11 DIAGNOSIS — M353 Polymyalgia rheumatica: Secondary | ICD-10-CM

## 2023-06-11 DIAGNOSIS — R053 Chronic cough: Secondary | ICD-10-CM | POA: Diagnosis not present

## 2023-06-11 DIAGNOSIS — E669 Obesity, unspecified: Secondary | ICD-10-CM

## 2023-06-11 DIAGNOSIS — N289 Disorder of kidney and ureter, unspecified: Secondary | ICD-10-CM | POA: Diagnosis not present

## 2023-06-11 LAB — BASIC METABOLIC PANEL
BUN: 15 mg/dL (ref 6–23)
CO2: 29 meq/L (ref 19–32)
Calcium: 9.1 mg/dL (ref 8.4–10.5)
Chloride: 103 meq/L (ref 96–112)
Creatinine, Ser: 1.04 mg/dL (ref 0.40–1.20)
GFR: 49.6 mL/min — ABNORMAL LOW (ref >60.00)
Glucose, Bld: 84 mg/dL (ref 70–99)
Potassium: 4.3 meq/L (ref 3.5–5.1)
Sodium: 140 meq/L (ref 135–145)

## 2023-06-11 LAB — TSH: TSH: 3.26 u[IU]/mL (ref 0.35–5.50)

## 2023-06-11 LAB — HEMOGLOBIN A1C: Hgb A1c MFr Bld: 5.6 % (ref 4.6–6.5)

## 2023-06-11 NOTE — Assessment & Plan Note (Signed)
bp in fair control at this time  BP Readings from Last 1 Encounters:  06/11/23 118/74   No changes needed/ up to date with cardiology care  Most recent labs reviewed  Disc lifstyle change with low sodium diet and exercise  Plan to continue losartan 50 mg daily (has labile blood pressure and instructed from card to add another 50 if needed) Metoprolol 25 mg bid  Pulse of 65  For cardiol clearance before shoulder surgery

## 2023-06-11 NOTE — Assessment & Plan Note (Signed)
No prednisone currently

## 2023-06-11 NOTE — Assessment & Plan Note (Signed)
Bmet today  Fluid intake per pt is improved lately

## 2023-06-11 NOTE — Assessment & Plan Note (Signed)
Disc goals for lipids and reasons to control them Rev last labs with pt Rev low sat fat diet in detail  Last LDL 76 in setting of CAD and past tia  Plan to continue atorvastatin 40 mg which she is tolerating Not motivated to eat better

## 2023-06-11 NOTE — Assessment & Plan Note (Signed)
Cardiology aware and will need card clearance for shoulder surgery  May be deconditioned  Cannot walk a block w/o shortness of breath

## 2023-06-11 NOTE — Assessment & Plan Note (Signed)
Last vitamin D Lab Results  Component Value Date   VD25OH 59.48 12/17/2022   Vitamin D level is therapeutic with current supplementation Disc importance of this to bone and overall health

## 2023-06-11 NOTE — Assessment & Plan Note (Signed)
A1c today.  

## 2023-06-11 NOTE — Assessment & Plan Note (Signed)
From pnd Takes occational tessalon and antihistamine

## 2023-06-11 NOTE — Patient Instructions (Addendum)
Check in with cardiology and hematology as recommended   Labs today   Take to Dr Herbie Baltimore about shortness of breath on exertion /exercise intolerance   Take care of yourself

## 2023-06-11 NOTE — Assessment & Plan Note (Signed)
Reviewed neuro notes from this summer  Was on plavix and asa Now plavix alone  Under care of cardiology who will need to guide re: pre operative clearance and safety re: holding this   No further tia/cva symptoms  Blood pressure and lipids are controlled

## 2023-06-11 NOTE — Progress Notes (Signed)
Subjective:    Patient ID: [name] [name], female    DOB: 07-07-1941, 82 y.o.   MRN: [id number]  HPI  Wt Readings from Last 3 Encounters:  06/11/23 172 lb 8 oz (78.2 kg)  03/09/23 169 lb (76.7 kg)  01/25/23 171 lb 9.6 oz (77.8 kg)   31.55 kg/m  Vitals:   06/11/23 1123  BP: 118/74  Pulse: 65  Temp: 98.2 F (36.8 C)  SpO2: 98%    Pt presents for operative medical clearance for upcoming right reverse total shoulder arthroplasty  With Dr Ranell Patrick  Wants flu shot today   Unsure which form of anesthesia  Most likely general   Has had other shoulder done Did fine   Form indicates she will have some labs at the hospital 10-14 d pre surgery   No surgery date yet   She has cardiology appointment upcoming for that clearance Also hematology   Allergies - no history of anaphylaxis No issues with tape  No issues with latex     HTN bp is stable today  No cp or palpitations or headaches or edema  No side effects to medicines  BP Readings from Last 3 Encounters:  06/11/23 118/74  04/15/23 132/61  03/09/23 (!) 144/88    Her cardiologist has allowed for permissive HTN due to labile blood pressure  Losartan 50 mg daily  Can take an addnl 50 if needed  Metoprolol 25 mg bid  Pulse Readings from Last 3 Encounters:  06/11/23 65  04/15/23 62  03/09/23 81    CAD History of TIA  (saw neuro in summer- still taking plavix and risk factors noted /reviewed)  Under care of cardiology  Losartan  Metoprolol  Plavix  Atorvastatin 40 mg daily  LS nitroglycerin prn   No stroke symptoms since April   Has GERD and past PUD Takes protonix 40 mg daily  No symptoms    Has baseline shortness of breath on exertion  No change in this  No wheezing or acute cough  Has some chronic cough in the past -from post nasal drip  Has never smoked  No cough or respiratory illnesses    Lab Results  Component Value Date   NA 136 04/15/2023   K 4.6 04/15/2023   CO2 25 04/15/2023    GLUCOSE 84 04/15/2023   BUN 20 04/15/2023   CREATININE 1.14 (H) 04/15/2023   CALCIUM 8.8 (L) 04/15/2023   GFR 52.80 (L) 12/17/2022   EGFR 59 (L) 12/10/2014   GFRNONAA 48 (L) 04/15/2023  Mild red GFR  Fluid intake is fair     Last vitamin D Lab Results  Component Value Date   VD25OH 59.48 12/17/2022   Taking 2000 international units bid   Anemia of chronic dz  Sees heme- Dr Pamelia Hoit  Lab Results  Component Value Date   WBC 6.1 04/15/2023   HGB 10.1 (L) 04/15/2023   HCT 31.5 (L) 04/15/2023   MCV 92.1 04/15/2023   PLT 265 04/15/2023  Retacrit every 12 wks if Hb less than 11   PMR in past  No current prednisone  Diabetes screening in past Lab Results  Component Value Date   HGBA1C 5.7 12/17/2022    Hyperlipidemia Lab Results  Component Value Date   CHOL 149 12/23/2022   HDL 42 12/23/2022   LDLCALC 76 12/23/2022   LDLDIRECT 75.0 12/02/2021   TRIG 154 (H) 12/23/2022   CHOLHDL 3.5 12/23/2022   Atorvastatin 40     Patient Active Problem  List   Diagnosis Date Noted   Pre-operative clearance 06/11/2023   Chronic cough 05/12/2023   TIA (transient ischemic attack) 12/23/2022   Asymptomatic bacteriuria 12/23/2022   Diabetes mellitus screening 12/14/2022   Elevated TSH 12/09/2021   Coronary artery disease involving native heart without angina pectoris 05/02/2021   Current use of proton pump inhibitor 10/29/2020   History of transient ischemic attack (TIA) 06/04/2020   Normochromic normocytic anemia    History of femur fracture 02/04/2020   Renal insufficiency 11/01/2019   Pedal edema 10/04/2018   S/P shoulder replacement, left 12/17/2017   S/P reverse total shoulder arthroplasty, left 12/17/2017   Routine general medical examination at a health care facility 10/14/2017   Preop cardiovascular exam 10/01/2017   CAD S/P percutaneous coronary angioplasty 01/27/2017   Aortic regurgitation 01/27/2017   Dyspnea on exertion 08/19/2016   Left bundle branch block  (LBBB) on electrocardiogram 08/19/2016   Estrogen deficiency 10/08/2015   Chronic fatigue 08/20/2015   Deficiency anemia 11/17/2013   PMR (polymyalgia rheumatica) (HCC) 07/17/2013   Intertrigo 04/19/2013   Obesity (BMI 30-39.9) 04/05/2013   S/P right THA, AA 04/04/2013   IBS (irritable bowel syndrome) 05/30/2012   Anemia of chronic disease 04/16/2010   Generalized anxiety disorder 04/16/2010   GERD 03/04/2009   Vitamin D deficiency 03/12/2008   URINARY TRACT INFECTION, RECURRENT 08/07/2007   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   FIBROCYSTIC BREAST DISEASE, HX OF 02/11/2007   Hyperlipidemia with target low density lipoprotein (LDL) cholesterol less than 70 mg/dL 40/98/1191   DEPRESSION 01/10/2007   RETINAL VEIN OCCLUSION 01/10/2007   Essential hypertension 01/10/2007   PEPTIC ULCER DISEASE 01/10/2007   Shoulder arthritis 01/10/2007   SKIN CANCER, HX OF 01/10/2007   Localized, primary osteoarthritis of shoulder region 01/10/2007   Past Medical History:  Diagnosis Date   Anemia    of chronic disease   Anxiety    Arthritis    "psoratic arthritis; new dx" (09/11/2016)   CAD S/P percutaneous coronary angioplasty 08/2016   mLCx 90% -> 0% (2.75 mm x 16 mm) Synergy DES PCI Ostial D1 70% (not PTCA target). Moderate ostial and mD2.  LAD tandem 50% stenoses w/ negative FFR (0.86)   Cancer (HCC)    skin cancer on leg   Chronic lower back pain    Complication of anesthesia 1980 and 1982   trouble waking up after breast biopsy, many surgeries since no problem   Depression    Dyspnea on effort - CHRONIC    partial relief of Sx post PCI of L Circumflex.   Fibrocystic breast    GERD (gastroesophageal reflux disease)    Headache    "monthly" (09/11/2016)   History of blood transfusion    "several since age 40; I've had them after most of my surgeries" (09/11/2016)   History of myocardial infarction     - Myoview [name] 2018 indicated small Inferior - Inferoseptal Infarct (vs. rest  ischemia). Echo with inferoseptal hypokinesis.   Hx of skin cancer, basal cell 2013 and 2012   right inner, lower leg; several scattered around my body   Hypertension    Iron deficiency anemia    "I've had an iron infusion"   Osteoarthritis    Osteopenia    Peptic ulcer disease    H pylori   PMR (polymyalgia rheumatica) (HCC)    PONV (postoperative nausea and vomiting)    Psoriasis    TIA (transient ischemic attack) 06/03/2020   Symptoms of right arm weakness/numbness and slurred speech  as well as right droopm => MRI brain/MRA head normal.  Normal echo normal carotid Dopplers.   Past Surgical History:  Procedure Laterality Date   APPENDECTOMY     BACK SURGERY     x 6    BREAST BIOPSY Left 1980 and 1982   "benign"   CARDIAC CATHETERIZATION N/A 09/11/2016   Procedure: Left Heart Cath and Coronary Angiography;  Surgeon: Marykay Lex, MD;  Location: [facility] INVASIVE CV LAB;  Service: Cardiovascular: mLCx 90% * (PCI). Ostial D1 70% (not PTCA target). Mod ostial and mD2.  LAD tandem 50% stenoses w/ negative FFR (0.86)   CARDIAC CATHETERIZATION N/A 09/11/2016   Procedure: Intravascular Pressure Wire/FFR Study;  Surgeon: Marykay Lex, MD;  Location: [facility] INVASIVE CV LAB;  Service: Cardiovascular: --  LAD tandem 50% stenoses w/ negative FFR (0.86   CARDIAC CATHETERIZATION N/A 09/11/2016   Procedure: Coronary Stent Intervention;  Surgeon: Marykay Lex, MD;  Location: [facility] INVASIVE CV LAB;  Service: Cardiovascular: -- DES PCI mLCx 90%--0% SYNERGY DES 2.75 MM X 16 MM.   CARDIAC CATHETERIZATION  2001   non-obstructive CAD   CATARACT EXTRACTION W/ INTRAOCULAR LENS  IMPLANT, BILATERAL  July -  August 2017   DILATION AND CURETTAGE OF UTERUS  X 3   ESOPHAGOGASTRODUODENOSCOPY  03/2009   ulcer,HP, GERD stricture   EXCISIONAL HEMORRHOIDECTOMY     HIP ARTHROPLASTY Left 2010   JOINT REPLACEMENT     KNEE ARTHROSCOPY Bilateral    LAPAROSCOPIC CHOLECYSTECTOMY  2005   LUMBAR DISC SURGERY  11/1984; 1987;  1989; 2004; 2005 X 2   deg. disk lumber spine    MOHS SURGERY Right    "inside my lower leg"   NASAL SINUS SURGERY  1970s   NM MYOVIEW LTD  08/28/2016   INTERMEDIATE RISK (b/c reduced EF & ? small infarct) -- EF ~35-40%.  ? Prior Small Inferior-apical & septal infarct (w/o ischemia).  Difuse HK - worse in inferoapex.  Suggest prior infarct with no ischemia. Ischemic area correlates with echo WMA   NM MYOVIEW LTD  05/27/2021   Findings consistent with prior anteroapical infarct with fixed defect.  No peri-infarct ischemia.  Notable improvement from prior study.:   ORIF FEMUR FRACTURE Right 01/04/2020   Procedure: OPEN REDUCTION INTERNAL FIXATION (ORIF) DISTAL FEMUR FRACTURE;  Surgeon: Samson Frederic, MD;  Location: [facility] OR;  Service: Orthopedics;  Laterality: Right;   REVERSE SHOULDER ARTHROPLASTY Left 12/17/2017   Procedure: LEFT REVERSE SHOULDER ARTHROPLASTY;  Surgeon: Beverely Low, MD;  Location: [facility] OR;  Service: Orthopedics;  Laterality: Left;   TOTAL HIP ARTHROPLASTY Right 04/04/2013   Procedure: RIGHT TOTAL HIP ARTHROPLASTY ANTERIOR APPROACH;  Surgeon: Shelda Pal, MD;  Location: [facility] ORS;  Service: Orthopedics;  Laterality: Right;   TOTAL KNEE ARTHROPLASTY Right 2009   TRANSTHORACIC ECHOCARDIOGRAM  1/'18; 1/'19   a) 1/'18: mild conc LVH. EF 45-50% - anteroseptal & inferoseptal HK. Mod AI.; b) 1/'19 - post PCI: (post PCI) --> EF normalized:  Normal LV systolic function; mild diastolic dysfunction; mild   LVH; sclerotic aortic valve with mild AI.  No RWMA   TRANSTHORACIC ECHOCARDIOGRAM  06/04/2020    for TIA: Normal LV size and function.  No or WMA.  EF 60 to 65%.  GR 1 DD with elevated LAP.  Normal RV size and function.  Normal RVP, RAP.  Mild aortic valve calcification-sclerosis but no stenosis.  Trivial AI.  No embolic source   TUBAL LIGATION     VAGINAL HYSTERECTOMY  1970s   partial    Social History   Tobacco Use   Smoking status: Never    Passive exposure: Never   Smokeless  tobacco: Never  Vaping Use   Vaping status: Never Used  Substance Use Topics   Alcohol use: Yes    Alcohol/week: 0.0 standard drinks of alcohol    Comment: 09/11/2016 "might have a margarita q 3-4 months"   Drug use: No   Family History  Problem Relation Age of Onset   Arthritis Mother    Emphysema Mother    Hyperlipidemia Mother    Hypertension Mother    Heart disease Mother    Heart disease Brother        CAD   Diabetes Paternal Grandmother    Heart disease Maternal Aunt    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   Ace Inhibitors     cough   Hydrochlorothiazide     Hyponatremia    Oxaprozin     GI upset    Pregabalin Swelling    SWELLING REACTION UNSPECIFIED    Amoxicillin-Pot Clavulanate Nausea And Vomiting    gi upset Has patient had a PCN reaction causing immediate rash, facial/tongue/throat swelling, SOB or lightheadedness with hypotension: No Has patient had a PCN reaction causing severe rash involving mucus membranes or skin necrosis: No Has patient had a PCN reaction that required hospitalization No Has patient had a PCN reaction occurring within the last 10 years: No If all of the above answers are "NO", then may proceed with Cephalosporin use.    Current Outpatient Medications on File Prior to Visit  Medication Sig Dispense Refill   acetaminophen (TYLENOL) 500 MG tablet Take 1,000 mg by mouth 2 (two) times daily as needed for moderate pain or headache.      albuterol (VENTOLIN HFA) 108 (90 Base) MCG/ACT inhaler INHALE 1 PUFF INTO THE LUNGS EVERY 6 (SIX) HOURS AS NEEDED (FOR COUGH). 6.7 each 1   atorvastatin (LIPITOR) 40 MG tablet Take 1 tablet (40 mg total) by mouth daily. 90 tablet 3   benzonatate (TESSALON) 200 MG capsule Take 1 capsule (200 mg total) by mouth 3 (three) times daily as needed for cough. 90 capsule 1   busPIRone (BUSPAR) 15 MG tablet TAKE 1 TABLET BY MOUTH EVERY DAY 90  tablet 3   cetirizine (ZYRTEC) 10 MG tablet TAKE 1 TABLET BY MOUTH EVERY DAY (Patient taking differently: Take 10 mg by mouth daily.) 90 tablet 1   clopidogrel (PLAVIX) 75 MG tablet Take 1 tablet (75 mg total) by mouth daily. 30 tablet 5   fluticasone (FLONASE) 50 MCG/ACT nasal spray PLACE 1 SPRAY INTO BOTH NOSTRILS DAILY AS NEEDED. 48 mL 1   HYDROcodone-acetaminophen (NORCO) 7.5-325 MG tablet Take 1 tablet by mouth 3 (three) times daily as needed.     losartan (COZAAR) 50 MG tablet TAKE 1 TABLET BY MOUTH EVERY DAY 90 tablet 2   Melatonin 3 MG CAPS Take 6 mg by mouth at bedtime.      methocarbamol (ROBAXIN) 500 MG tablet TAKE 1 TABLET BY MOUTH EVERY 8 HOURS AS NEEDED FOR MUSCLE SPASMS 90 tablet 2   metoprolol tartrate (LOPRESSOR) 25 MG tablet TAKE 1 TABLET BY MOUTH TWICE A DAY 180 tablet 3   nitroGLYCERIN (NITROSTAT) 0.4 MG SL tablet Place 1 tablet (0.4 mg total) under the tongue every 5 (five) minutes as needed. 25 tablet 3   nystatin (MYCOSTATIN/NYSTOP) powder  Apply topically 2 (two) times daily as needed. To affected areas to prevent yeast (in skin folds) 30 g 1   pantoprazole (PROTONIX) 40 MG tablet TAKE 1 TABLET BY MOUTH EVERY DAY 90 tablet 2   sertraline (ZOLOFT) 25 MG tablet TAKE 1 TABLET (25 MG TOTAL) BY MOUTH DAILY. 90 tablet 2   Vitamin D, Cholecalciferol, 25 MCG (1000 UT) CAPS Take 2,000 Units by mouth in the morning and at bedtime.      No current facility-administered medications on file prior to visit.    Review of Systems  Constitutional:  Positive for fatigue. Negative for activity change, appetite change, fever and unexpected weight change.       Poor exercise tol  HENT:  Negative for congestion, ear pain, rhinorrhea, sinus pressure and sore throat.   Eyes:  Negative for pain, redness and visual disturbance.  Respiratory:  Positive for shortness of breath. Negative for cough, wheezing and stridor.        Baseline shortness of breath on exertion   Cardiovascular:  Negative  for chest pain, palpitations and leg swelling.  Gastrointestinal:  Negative for abdominal pain, blood in stool, constipation and diarrhea.  Endocrine: Negative for polydipsia and polyuria.  Genitourinary:  Negative for dysuria, frequency and urgency.  Musculoskeletal:  Positive for arthralgias. Negative for back pain and myalgias.  Skin:  Negative for pallor and rash.  Allergic/Immunologic: Negative for environmental allergies.  Neurological:  Negative for dizziness, syncope and headaches.  Hematological:  Negative for adenopathy. Does not bruise/bleed easily.  Psychiatric/Behavioral:  Negative for decreased concentration and dysphoric mood. The patient is not nervous/anxious.        Objective:   Physical Exam        Assessment & Plan:   Problem List Items Addressed This Visit       Cardiovascular and Mediastinum   Essential hypertension (Chronic)    bp in fair control at this time  BP Readings from Last 1 Encounters:  06/11/23 118/74   No changes needed/ up to date with cardiology care  Most recent labs reviewed  Disc lifstyle change with low sodium diet and exercise  Plan to continue losartan 50 mg daily (has labile blood pressure and instructed from card to add another 50 if needed) Metoprolol 25 mg bid  Pulse of 65  For cardiol clearance before shoulder surgery       Relevant Orders   Basic metabolic panel   TSH     Genitourinary   Renal insufficiency    Bmet today  Fluid intake per pt is improved lately      Relevant Orders   Basic metabolic panel     Other   Anemia of chronic disease (Chronic)    Will follow up with hematology for surg clearance  Last hb of 10.1 Gets retacrit inj every 12 weeks if under 11         Dyspnea on exertion (Chronic)    Cardiology aware and will need card clearance for shoulder surgery  May be deconditioned  Cannot walk a block w/o shortness of breath       Hyperlipidemia with target low density lipoprotein (LDL)  cholesterol less than 70 mg/dL (Chronic)    Disc goals for lipids and reasons to control them Rev last labs with pt Rev low sat fat diet in detail  Last LDL 76 in setting of CAD and past tia  Plan to continue atorvastatin 40 mg which she is tolerating Not motivated to eat better  Chronic cough    From pnd Takes occational tessalon and antihistamine       Diabetes mellitus screening    A1c today      Relevant Orders   Hemoglobin A1c   History of transient ischemic attack (TIA)    Reviewed neuro notes from this summer  Was on plavix and asa Now plavix alone  Under care of cardiology who will need to guide re: pre operative clearance and safety re: holding this   No further tia/cva symptoms  Blood pressure and lipids are controlled      Obesity (BMI 30-39.9)    Bmi of 31.5  This does affect surgical risk somewhat   No known OSA or symptoms       PMR (polymyalgia rheumatica) (HCC)    No prednisone currently      Pre-operative clearance - Primary    For right reverse shoulder total arthroplasty with Dr Ranell Patrick  Likely gen anesth  Complex medical pt  Will need clearance from heme (anemia) and cardiology (multiple) History of CVA on plavix- will need cardiology input re holding this  Poor exercise tolerance Obese  HTN is well controlled  Labs today for chem and A1c   No known lung problems  Flu shot given today      Vitamin D deficiency    Last vitamin D Lab Results  Component Value Date   VD25OH 59.48 12/17/2022   Vitamin D level is therapeutic with current supplementation Disc importance of this to bone and overall health       Other Visit Diagnoses     Need for influenza vaccination       Relevant Orders   Flu Vaccine Trivalent High Dose (Fluad) (Completed)

## 2023-06-11 NOTE — Assessment & Plan Note (Addendum)
Bmi of 31.5  This does affect surgical risk somewhat   No known OSA or symptoms

## 2023-06-11 NOTE — Assessment & Plan Note (Signed)
Will follow up with hematology for surg clearance  Last hb of 10.1 Gets retacrit inj every 12 weeks if under 11

## 2023-06-11 NOTE — Assessment & Plan Note (Signed)
For right reverse shoulder total arthroplasty with Dr Ranell Patrick  Likely gen anesth  Complex medical pt  Will need clearance from heme (anemia) and cardiology (multiple) History of CVA on plavix- will need cardiology input re holding this  Poor exercise tolerance Obese  HTN is well controlled  Labs today for chem and A1c   No known lung problems  Flu shot given today

## 2023-06-23 ENCOUNTER — Ambulatory Visit: Payer: Medicare Other | Attending: Nurse Practitioner

## 2023-06-23 DIAGNOSIS — Z01810 Encounter for preprocedural cardiovascular examination: Secondary | ICD-10-CM

## 2023-06-23 NOTE — Progress Notes (Signed)
Virtual Visit via Telephone Note   Because of [name] [name]'s co-morbid illnesses, she is at least at moderate risk for complications without adequate follow up.  This format is felt to be most appropriate for this patient at this time.  The patient did not have access to video technology/had technical difficulties with video requiring transitioning to audio format only (telephone).  All issues noted in this document were discussed and addressed.  No physical exam could be performed with this format.  Please refer to the patient's chart for her consent to telehealth for [facility].  Evaluation Performed:  Preoperative cardiovascular risk assessment _____________   Date:  06/23/2023   Patient ID:  [name] [name], DOB 03/04/1941, MRN [id number] Patient Location:  Home Provider location:   Office  Primary Care Provider:  Judy Pimple, MD Primary Cardiologist:  Bryan Lemma, MD  Chief Complaint / Patient Profile   82 y.o. y/o female with a h/o hypertension, TIA, hyperlipidemia, CAD (chronic) status post PCI, chronic DOE, chronic fatigue, LBBB, chronic pedal edema  who is pending right reverse total shoulder arthroplasty and presents today for telephonic preoperative cardiovascular risk assessment.  History of Present Illness    [name] [name] is a 82 y.o. female who presents via audio/video conferencing for a telehealth visit today.  Pt was last seen in cardiology clinic on 01/25/2023 by Dr. Herbie Baltimore following recent hospitalization of TIA.  At that time ELIANNIE GEISEL was doing well with no new cardiac complaints. She reported feeling close to baseline prior to hospitalization.  The patient is now pending procedure as outlined above. Since her last visit, she has been doing well with controlled BP at 118/74.  She  denies chest pain, shortness of breath, lower extremity edema, fatigue, palpitations, melena, hematuria, hemoptysis, diaphoresis, weakness, presyncope, syncope,  orthopnea, and PND.    Past Medical History    Past Medical History:  Diagnosis Date   Anemia    of chronic disease   Anxiety    Arthritis    "psoratic arthritis; new dx" (09/11/2016)   CAD S/P percutaneous coronary angioplasty 08/2016   mLCx 90% -> 0% (2.75 mm x 16 mm) Synergy DES PCI Ostial D1 70% (not PTCA target). Moderate ostial and mD2.  LAD tandem 50% stenoses w/ negative FFR (0.86)   Cancer (HCC)    skin cancer on leg   Chronic lower back pain    Complication of anesthesia 1980 and 1982   trouble waking up after breast biopsy, many surgeries since no problem   Depression    Dyspnea on effort - CHRONIC    partial relief of Sx post PCI of L Circumflex.   Fibrocystic breast    GERD (gastroesophageal reflux disease)    Headache    "monthly" (09/11/2016)   History of blood transfusion    "several since age 53; I've had them after most of my surgeries" (09/11/2016)   History of myocardial infarction     - Myoview [name] 2018 indicated small Inferior - Inferoseptal Infarct (vs. rest ischemia). Echo with inferoseptal hypokinesis.   Hx of skin cancer, basal cell 2013 and 2012   right inner, lower leg; several scattered around my body   Hypertension    Iron deficiency anemia    "I've had an iron infusion"   Osteoarthritis    Osteopenia    Peptic ulcer disease    H pylori   PMR (polymyalgia rheumatica) (HCC)    PONV (postoperative nausea and vomiting)  Psoriasis    TIA (transient ischemic attack) 06/03/2020   Symptoms of right arm weakness/numbness and slurred speech as well as right droopm => MRI brain/MRA head normal.  Normal echo normal carotid Dopplers.   Past Surgical History:  Procedure Laterality Date   APPENDECTOMY     BACK SURGERY     x 6    BREAST BIOPSY Left 1980 and 1982   "benign"   CARDIAC CATHETERIZATION N/A 09/11/2016   Procedure: Left Heart Cath and Coronary Angiography;  Surgeon: Marykay Lex, MD;  Location: [facility] INVASIVE CV LAB;  Service:  Cardiovascular: mLCx 90% * (PCI). Ostial D1 70% (not PTCA target). Mod ostial and mD2.  LAD tandem 50% stenoses w/ negative FFR (0.86)   CARDIAC CATHETERIZATION N/A 09/11/2016   Procedure: Intravascular Pressure Wire/FFR Study;  Surgeon: Marykay Lex, MD;  Location: [facility] INVASIVE CV LAB;  Service: Cardiovascular: --  LAD tandem 50% stenoses w/ negative FFR (0.86   CARDIAC CATHETERIZATION N/A 09/11/2016   Procedure: Coronary Stent Intervention;  Surgeon: Marykay Lex, MD;  Location: [facility] INVASIVE CV LAB;  Service: Cardiovascular: -- DES PCI mLCx 90%--0% SYNERGY DES 2.75 MM X 16 MM.   CARDIAC CATHETERIZATION  2001   non-obstructive CAD   CATARACT EXTRACTION W/ INTRAOCULAR LENS  IMPLANT, BILATERAL  July -  August 2017   DILATION AND CURETTAGE OF UTERUS  X 3   ESOPHAGOGASTRODUODENOSCOPY  03/2009   ulcer,HP, GERD stricture   EXCISIONAL HEMORRHOIDECTOMY     HIP ARTHROPLASTY Left 2010   JOINT REPLACEMENT     KNEE ARTHROSCOPY Bilateral    LAPAROSCOPIC CHOLECYSTECTOMY  2005   LUMBAR DISC SURGERY  11/1984; 1987; 1989; 2004; 2005 X 2   deg. disk lumber spine    MOHS SURGERY Right    "inside my lower leg"   NASAL SINUS SURGERY  1970s   NM MYOVIEW LTD  08/28/2016   INTERMEDIATE RISK (b/c reduced EF & ? small infarct) -- EF ~35-40%.  ? Prior Small Inferior-apical & septal infarct (w/o ischemia).  Difuse HK - worse in inferoapex.  Suggest prior infarct with no ischemia. Ischemic area correlates with echo WMA   NM MYOVIEW LTD  05/27/2021   Findings consistent with prior anteroapical infarct with fixed defect.  No peri-infarct ischemia.  Notable improvement from prior study.:   ORIF FEMUR FRACTURE Right 01/04/2020   Procedure: OPEN REDUCTION INTERNAL FIXATION (ORIF) DISTAL FEMUR FRACTURE;  Surgeon: Samson Frederic, MD;  Location: [facility] OR;  Service: Orthopedics;  Laterality: Right;   REVERSE SHOULDER ARTHROPLASTY Left 12/17/2017   Procedure: LEFT REVERSE SHOULDER ARTHROPLASTY;  Surgeon: Beverely Low, MD;   Location: [facility] OR;  Service: Orthopedics;  Laterality: Left;   TOTAL HIP ARTHROPLASTY Right 04/04/2013   Procedure: RIGHT TOTAL HIP ARTHROPLASTY ANTERIOR APPROACH;  Surgeon: Shelda Pal, MD;  Location: [facility] ORS;  Service: Orthopedics;  Laterality: Right;   TOTAL KNEE ARTHROPLASTY Right 2009   TRANSTHORACIC ECHOCARDIOGRAM  1/'18; 1/'19   a) 1/'18: mild conc LVH. EF 45-50% - anteroseptal & inferoseptal HK. Mod AI.; b) 1/'19 - post PCI: (post PCI) --> EF normalized:  Normal LV systolic function; mild diastolic dysfunction; mild   LVH; sclerotic aortic valve with mild AI.  No RWMA   TRANSTHORACIC ECHOCARDIOGRAM  06/04/2020    for TIA: Normal LV size and function.  No or WMA.  EF 60 to 65%.  GR 1 DD with elevated LAP.  Normal RV size and function.  Normal RVP, RAP.  Mild aortic valve calcification-sclerosis but  no stenosis.  Trivial AI.  No embolic source   TUBAL LIGATION     VAGINAL HYSTERECTOMY  1970s   partial     Allergies  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   Ace Inhibitors     cough   Hydrochlorothiazide     Hyponatremia    Oxaprozin     GI upset    Pregabalin Swelling    SWELLING REACTION UNSPECIFIED    Amoxicillin-Pot Clavulanate Nausea And Vomiting    gi upset Has patient had a PCN reaction causing immediate rash, facial/tongue/throat swelling, SOB or lightheadedness with hypotension: No Has patient had a PCN reaction causing severe rash involving mucus membranes or skin necrosis: No Has patient had a PCN reaction that required hospitalization No Has patient had a PCN reaction occurring within the last 10 years: No If all of the above answers are "NO", then may proceed with Cephalosporin use.     Home Medications    Prior to Admission medications   Medication Sig Start Date End Date Taking? Authorizing Provider  acetaminophen (TYLENOL) 500 MG tablet Take 1,000 mg by mouth 2 (two) times daily as  needed for moderate pain or headache.     [provider]  albuterol (VENTOLIN HFA) 108 (90 Base) MCG/ACT inhaler INHALE 1 PUFF INTO THE LUNGS EVERY 6 (SIX) HOURS AS NEEDED (FOR COUGH). 02/04/21   Tower, Audrie Gallus, MD  atorvastatin (LIPITOR) 40 MG tablet Take 1 tablet (40 mg total) by mouth daily. 12/18/22 06/10/24  Marykay Lex, MD  benzonatate (TESSALON) 200 MG capsule Take 1 capsule (200 mg total) by mouth 3 (three) times daily as needed for cough. 05/12/23   Tower, Audrie Gallus, MD  busPIRone (BUSPAR) 15 MG tablet TAKE 1 TABLET BY MOUTH EVERY DAY 01/01/23   Tower, Audrie Gallus, MD  cetirizine (ZYRTEC) 10 MG tablet TAKE 1 TABLET BY MOUTH EVERY DAY Patient taking differently: Take 10 mg by mouth daily. 12/18/22   Tower, Audrie Gallus, MD  clopidogrel (PLAVIX) 75 MG tablet Take 1 tablet (75 mg total) by mouth daily. 01/20/23   Marykay Lex, MD  fluticasone (FLONASE) 50 MCG/ACT nasal spray PLACE 1 SPRAY INTO BOTH NOSTRILS DAILY AS NEEDED. 05/25/22   Tower, Audrie Gallus, MD  HYDROcodone-acetaminophen (NORCO) 7.5-325 MG tablet Take 1 tablet by mouth 3 (three) times daily as needed.    [provider]  losartan (COZAAR) 50 MG tablet TAKE 1 TABLET BY MOUTH EVERY DAY 05/26/23   Marykay Lex, MD  Melatonin 3 MG CAPS Take 6 mg by mouth at bedtime.     [provider]  methocarbamol (ROBAXIN) 500 MG tablet TAKE 1 TABLET BY MOUTH EVERY 8 HOURS AS NEEDED FOR MUSCLE SPASMS 06/07/23   Tower, Audrie Gallus, MD  metoprolol tartrate (LOPRESSOR) 25 MG tablet TAKE 1 TABLET BY MOUTH TWICE A DAY 05/10/23   Marykay Lex, MD  nitroGLYCERIN (NITROSTAT) 0.4 MG SL tablet Place 1 tablet (0.4 mg total) under the tongue every 5 (five) minutes as needed. 11/07/21 12/23/23  Marykay Lex, MD  nystatin (MYCOSTATIN/NYSTOP) powder Apply topically 2 (two) times daily as needed. To affected areas to prevent yeast (in skin folds) 07/23/20   Tower, Audrie Gallus, MD  pantoprazole (PROTONIX) 40 MG tablet TAKE 1 TABLET BY MOUTH EVERY DAY  06/07/23   Marykay Lex, MD  sertraline (ZOLOFT) 25 MG tablet TAKE 1 TABLET (25 MG TOTAL) BY MOUTH  DAILY. 03/23/23   Tower, Audrie Gallus, MD  Vitamin D, Cholecalciferol, 25 MCG (1000 UT) CAPS Take 2,000 Units by mouth in the morning and at bedtime.     [provider]    Physical Exam    Vital Signs:  DAYAN CLEGG does not have vital signs available for review today.118/74  Given telephonic nature of communication, physical exam is limited. AAOx3. NAD. Normal affect.  Speech and respirations are unlabored.  Accessory Clinical Findings    None  Assessment & Plan    1.  Preoperative Cardiovascular Risk Assessment: -Patient's RCRI score is 11% The patient affirms she has been doing well without any new cardiac symptoms. They are able to achieve 4 METS without cardiac limitations. Therefore, based on ACC/AHA guidelines, the patient would be at acceptable risk for the planned procedure without further cardiovascular testing. The patient was advised that if she develops new symptoms prior to surgery to contact our office to arrange for a follow-up visit, and she verbalized understanding.   The patient was advised that if she develops new symptoms prior to surgery to contact our office to arrange for a follow-up visit, and she verbalized understanding.  She can hold her Plavix 5 days prior to her procedure and should restart post procedure when surgically safe.  A copy of this note will be routed to requesting surgeon.  Time:   Today, I have spent 7 minutes with the patient with telehealth technology discussing medical history, symptoms, and management plan.     Napoleon Form, Leodis Rains, NP  06/23/2023, 7:19 AM

## 2023-07-03 ENCOUNTER — Other Ambulatory Visit: Payer: Self-pay | Admitting: Family Medicine

## 2023-07-03 DIAGNOSIS — J302 Other seasonal allergic rhinitis: Secondary | ICD-10-CM

## 2023-07-05 ENCOUNTER — Other Ambulatory Visit: Payer: Self-pay | Admitting: Cardiology

## 2023-07-15 ENCOUNTER — Other Ambulatory Visit: Payer: Self-pay | Admitting: *Deleted

## 2023-07-15 ENCOUNTER — Inpatient Hospital Stay: Payer: Medicare Other

## 2023-07-15 ENCOUNTER — Inpatient Hospital Stay: Payer: Medicare Other | Attending: Hematology and Oncology

## 2023-07-15 ENCOUNTER — Inpatient Hospital Stay: Payer: Medicare Other | Admitting: Hematology and Oncology

## 2023-07-15 VITALS — BP 141/63 | HR 59 | Temp 98.4°F | Resp 16

## 2023-07-15 VITALS — BP 152/58 | HR 65 | Temp 97.3°F | Resp 18 | Ht 62.0 in | Wt 176.1 lb

## 2023-07-15 DIAGNOSIS — D638 Anemia in other chronic diseases classified elsewhere: Secondary | ICD-10-CM | POA: Diagnosis not present

## 2023-07-15 DIAGNOSIS — M353 Polymyalgia rheumatica: Secondary | ICD-10-CM | POA: Insufficient documentation

## 2023-07-15 LAB — CMP (CANCER CENTER ONLY)
ALT: 9 U/L (ref 0–44)
AST: 17 U/L (ref 15–41)
Albumin: 3.7 g/dL (ref 3.5–5.0)
Alkaline Phosphatase: 60 U/L (ref 38–126)
Anion gap: 5 (ref 5–15)
BUN: 22 mg/dL (ref 8–23)
CO2: 26 mmol/L (ref 22–32)
Calcium: 9 mg/dL (ref 8.9–10.3)
Chloride: 108 mmol/L (ref 98–111)
Creatinine: 1.2 mg/dL — ABNORMAL HIGH (ref 0.44–1.00)
GFR, Estimated: 45 mL/min — ABNORMAL LOW (ref >60)
Glucose, Bld: 112 mg/dL — ABNORMAL HIGH (ref 70–99)
Potassium: 4 mmol/L (ref 3.5–5.1)
Sodium: 139 mmol/L (ref 135–145)
Total Bilirubin: 0.4 mg/dL (ref <1.2)
Total Protein: 6.7 g/dL (ref 6.5–8.1)

## 2023-07-15 LAB — FERRITIN: Ferritin: 50 ng/mL (ref 11–307)

## 2023-07-15 LAB — CBC WITH DIFFERENTIAL (CANCER CENTER ONLY)
Abs Immature Granulocytes: 0.01 10*3/uL (ref 0.00–0.07)
Basophils Absolute: 0 10*3/uL (ref 0.0–0.1)
Basophils Relative: 1 %
Eosinophils Absolute: 0.2 10*3/uL (ref 0.0–0.5)
Eosinophils Relative: 4 %
HCT: 31.4 % — ABNORMAL LOW (ref 36.0–46.0)
Hemoglobin: 9.9 g/dL — ABNORMAL LOW (ref 12.0–15.0)
Immature Granulocytes: 0 %
Lymphocytes Relative: 29 %
Lymphs Abs: 1.6 10*3/uL (ref 0.7–4.0)
MCH: 29 pg (ref 26.0–34.0)
MCHC: 31.5 g/dL (ref 30.0–36.0)
MCV: 92.1 fL (ref 80.0–100.0)
Monocytes Absolute: 0.4 10*3/uL (ref 0.1–1.0)
Monocytes Relative: 8 %
Neutro Abs: 3.1 10*3/uL (ref 1.7–7.7)
Neutrophils Relative %: 58 %
Platelet Count: 235 10*3/uL (ref 150–400)
RBC: 3.41 MIL/uL — ABNORMAL LOW (ref 3.87–5.11)
RDW: 12.4 % (ref 11.5–15.5)
WBC Count: 5.3 10*3/uL (ref 4.0–10.5)
nRBC: 0 % (ref 0.0–0.2)

## 2023-07-15 LAB — IRON AND IRON BINDING CAPACITY (CC-WL,HP ONLY)
Iron: 50 ug/dL (ref 28–170)
Saturation Ratios: 16 % (ref 10.4–31.8)
TIBC: 305 ug/dL (ref 250–450)
UIBC: 255 ug/dL (ref 148–442)

## 2023-07-15 LAB — VITAMIN B12: Vitamin B-12: 317 pg/mL (ref 180–914)

## 2023-07-15 MED ORDER — EPOETIN ALFA-EPBX 40000 UNIT/ML IJ SOLN
40000.0000 [IU] | Freq: Once | INTRAMUSCULAR | Status: AC
  Administered 2023-07-15: 40000 [IU] via SUBCUTANEOUS
  Filled 2023-07-15: qty 1

## 2023-07-15 NOTE — Patient Instructions (Signed)

## 2023-07-15 NOTE — Assessment & Plan Note (Signed)
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    Lab review  10/12/2017: Hemoglobin 10.2, MCV 94, normal WBC and platelet counts, creatinine 1.02 04/13/2019: Hemoglobin 9 06/03/20: Hb 11.2 (did not receive Retacrit) 06/27/2020: Hemoglobin 10.3 (receiving Retacrit injection) 10/15/2020: Hemoglobin 10.2 (receiving Retacrit) 04/17/2021: Hemoglobin 10.6 (receiving Retacrit) now planning to do the injection every 3 months  07/21/21: hemoglobin: 9.9 (receiving Retacrit) 04/23/2022: Hemoglobin 10.7, ferritin 63, iron saturation 14%, B12 575 07/15/2022: Hemoglobin 9.9 01/13/2023: Hemoglobin 10.6 04/15/2023: Hemoglobin 10.1 07/15/2023     Current treatment: Retacrit 40,000 units currently receiving it every 12 weeks. (Inj if Hb is less than 10.5) last Retacrit was given in March 2024    Return to clinic in 3 months for labs and every 6 months for follow-up with me

## 2023-07-15 NOTE — Progress Notes (Signed)
Patient Care Team: Tower, Audrie Gallus, MD as PCP - General Herbie Baltimore Piedad Climes, MD as PCP - Cardiology (Cardiology) Sheran Luz, MD as Consulting Physician (Physical Medicine and Rehabilitation) Zenovia Jordan, MD as Consulting Physician (Rheumatology) Haverstock, Elvin So, MD as Referring Physician (Dermatology) Serena Croissant, MD as Consulting Physician (Hematology and Oncology) Kathyrn Sheriff, [facility] (Inactive) as Pharmacist (Pharmacist)  DIAGNOSIS:  Encounter Diagnosis  Name Primary?   Anemia of chronic disease Yes      CHIEF COMPLIANT: Follow-up on Retacrit injections for anemia of chronic disease  HISTORY OF PRESENT ILLNESS:   History of Present Illness   The patient, with a history of anemia, presents for a routine follow-up. She reports a recent hospitalization in April for a suspected transient ischemic attack (TIA), but no residual deficits were noted. Since the event, she has been staying with her daughters for support. She also reports significant family stressors, including a recent stroke in her daughter and a cancer diagnosis in her grandson.  Regarding her anemia, her hemoglobin has remained stable around 10 g/dL with injections every three months. She is considering a shoulder replacement surgery and is concerned about her hemoglobin levels during the perioperative period. She has a history of requiring blood transfusions postoperatively.         ALLERGIES:  is allergic to sulfonamide derivatives, ace inhibitors, hydrochlorothiazide, oxaprozin, pregabalin, and amoxicillin-pot clavulanate.  MEDICATIONS:  Current Outpatient Medications  Medication Sig Dispense Refill   acetaminophen (TYLENOL) 500 MG tablet Take 1,000 mg by mouth 2 (two) times daily as needed for moderate pain or headache.      albuterol (VENTOLIN HFA) 108 (90 Base) MCG/ACT inhaler INHALE 1 PUFF INTO THE LUNGS EVERY 6 (SIX) HOURS AS NEEDED (FOR COUGH). 6.7 each 1   atorvastatin (LIPITOR) 40 MG  tablet Take 1 tablet (40 mg total) by mouth daily. 90 tablet 3   benzonatate (TESSALON) 200 MG capsule Take 1 capsule (200 mg total) by mouth 3 (three) times daily as needed for cough. 90 capsule 1   busPIRone (BUSPAR) 15 MG tablet TAKE 1 TABLET BY MOUTH EVERY DAY 90 tablet 3   cetirizine (ZYRTEC) 10 MG tablet TAKE 1 TABLET BY MOUTH EVERY DAY 90 tablet 1   clopidogrel (PLAVIX) 75 MG tablet TAKE 1 TABLET BY MOUTH EVERY DAY 90 tablet 2   fluticasone (FLONASE) 50 MCG/ACT nasal spray PLACE 1 SPRAY INTO BOTH NOSTRILS DAILY AS NEEDED. 48 mL 1   HYDROcodone-acetaminophen (NORCO) 7.5-325 MG tablet Take 1 tablet by mouth 3 (three) times daily as needed.     losartan (COZAAR) 50 MG tablet TAKE 1 TABLET BY MOUTH EVERY DAY 90 tablet 2   Melatonin 3 MG CAPS Take 6 mg by mouth at bedtime.      methocarbamol (ROBAXIN) 500 MG tablet TAKE 1 TABLET BY MOUTH EVERY 8 HOURS AS NEEDED FOR MUSCLE SPASMS 90 tablet 2   metoprolol tartrate (LOPRESSOR) 25 MG tablet TAKE 1 TABLET BY MOUTH TWICE A DAY 180 tablet 3   nitroGLYCERIN (NITROSTAT) 0.4 MG SL tablet Place 1 tablet (0.4 mg total) under the tongue every 5 (five) minutes as needed. 25 tablet 3   nystatin (MYCOSTATIN/NYSTOP) powder Apply topically 2 (two) times daily as needed. To affected areas to prevent yeast (in skin folds) 30 g 1   pantoprazole (PROTONIX) 40 MG tablet TAKE 1 TABLET BY MOUTH EVERY DAY 90 tablet 2   sertraline (ZOLOFT) 25 MG tablet TAKE 1 TABLET (25 MG TOTAL) BY MOUTH DAILY. 90 tablet  2   Vitamin D, Cholecalciferol, 25 MCG (1000 UT) CAPS Take 2,000 Units by mouth in the morning and at bedtime.      No current facility-administered medications for this visit.    PHYSICAL EXAMINATION: ECOG PERFORMANCE STATUS: 1 - Symptomatic but completely ambulatory  Vitals:   07/15/23 1102  BP: (!) 152/58  Pulse: 65  Resp: 18  Temp: (!) 97.3 F (36.3 C)  SpO2: 97%   Filed Weights   07/15/23 1102  Weight: 176 lb 1.6 oz (79.9 kg)      LABORATORY DATA:   I have reviewed the data as listed    Latest Ref Rng & Units 06/11/2023   12:06 PM 04/15/2023    2:25 PM 01/13/2023    9:32 AM  CMP  Glucose 70 - 99 mg/dL 84  84  88   BUN 6 - 23 mg/dL 15  20  20    Creatinine 0.40 - 1.20 mg/dL 4.78  2.95  6.21   Sodium 135 - 145 mEq/L 140  136  138   Potassium 3.5 - 5.1 mEq/L 4.3  4.6  4.2   Chloride 96 - 112 mEq/L 103  104  105   CO2 19 - 32 mEq/L 29  25  27    Calcium 8.4 - 10.5 mg/dL 9.1  8.8  8.9   Total Protein 6.5 - 8.1 g/dL  6.8  6.8   Total Bilirubin 0.3 - 1.2 mg/dL  0.5  0.4   Alkaline Phos 38 - 126 U/L  52  57   AST 15 - 41 U/L  19  18   ALT 0 - 44 U/L  14  13     Lab Results  Component Value Date   WBC 5.3 07/15/2023   HGB 9.9 (L) 07/15/2023   HCT 31.4 (L) 07/15/2023   MCV 92.1 07/15/2023   PLT 235 07/15/2023   NEUTROABS 3.1 07/15/2023    ASSESSMENT & PLAN:  Anemia of chronic disease 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    Lab review  10/12/2017: Hemoglobin 10.2, MCV 94, normal WBC and platelet counts, creatinine 1.02 04/13/2019: Hemoglobin 9 06/03/20: Hb 11.2 (did not receive Retacrit) 06/27/2020: Hemoglobin 10.3 (receiving Retacrit injection) 10/15/2020: Hemoglobin 10.2 (receiving Retacrit) 04/17/2021: Hemoglobin 10.6 (receiving Retacrit) now planning to do the injection every 3 months  07/21/21: hemoglobin: 9.9 (receiving Retacrit) 04/23/2022: Hemoglobin 10.7, ferritin 63, iron saturation 14%, B12 575 07/15/2022: Hemoglobin 9.9 01/13/2023: Hemoglobin 10.6 04/15/2023: Hemoglobin 10.1 (Retacrit injection) 07/15/2023: Hemoglobin 9.9 (Retacrit injection)     Current treatment: Retacrit 40,000 units currently receiving it every 12 weeks. (Inj if Hb is less than 10.5) last Retacrit was given in March 2024    Cerebrovascular Accident (CVA) Reported a possible mini stroke in April with no  residual damage. No acute issues discussed today. -Continue current management.  Shoulder Replacement Surgery Discussed upcoming shoulder replacement surgery and the need for optimal hemoglobin levels. Patient has a history of requiring blood transfusion post-surgery. -Plan to recheck hemoglobin prior to surgery. -If hemoglobin drops significantly post-surgery, consider blood transfusion.      Return to clinic in 3 months for labs, injection and MD visit    Orders Placed This Encounter  Procedures   CBC with Differential (Cancer Center Only)    Standing Status:   Future    Standing Expiration Date:   07/14/2024   Ferritin  Standing Status:   Future    Standing Expiration Date:   07/14/2024   Iron and Iron Binding Capacity ([facility] only)    Standing Status:   Future    Standing Expiration Date:   07/14/2024   The patient has a good understanding of the overall plan. she agrees with it. she will call with any problems that may develop before the next visit here. Total time spent: 30 mins including face to face time and time spent for planning, charting and co-ordination of care   Tamsen Meek, MD 07/15/23

## 2023-07-29 ENCOUNTER — Ambulatory Visit: Payer: Medicare Other | Attending: Cardiology | Admitting: Cardiology

## 2023-07-29 ENCOUNTER — Encounter: Payer: Self-pay | Admitting: Cardiology

## 2023-07-29 VITALS — BP 129/71 | HR 55 | Ht 62.75 in | Wt 179.4 lb

## 2023-07-29 DIAGNOSIS — I2510 Atherosclerotic heart disease of native coronary artery without angina pectoris: Secondary | ICD-10-CM | POA: Diagnosis not present

## 2023-07-29 DIAGNOSIS — I10 Essential (primary) hypertension: Secondary | ICD-10-CM

## 2023-07-29 DIAGNOSIS — Z9861 Coronary angioplasty status: Secondary | ICD-10-CM

## 2023-07-29 DIAGNOSIS — R5382 Chronic fatigue, unspecified: Secondary | ICD-10-CM | POA: Diagnosis not present

## 2023-07-29 DIAGNOSIS — D638 Anemia in other chronic diseases classified elsewhere: Secondary | ICD-10-CM

## 2023-07-29 DIAGNOSIS — Z01810 Encounter for preprocedural cardiovascular examination: Secondary | ICD-10-CM | POA: Diagnosis not present

## 2023-07-29 DIAGNOSIS — E785 Hyperlipidemia, unspecified: Secondary | ICD-10-CM

## 2023-07-29 DIAGNOSIS — R600 Localized edema: Secondary | ICD-10-CM

## 2023-07-29 DIAGNOSIS — Z8673 Personal history of transient ischemic attack (TIA), and cerebral infarction without residual deficits: Secondary | ICD-10-CM | POA: Diagnosis not present

## 2023-07-29 MED ORDER — METOPROLOL SUCCINATE ER 25 MG PO TB24: ORAL_TABLET | ORAL | 3 refills | Status: AC

## 2023-07-29 MED ORDER — LOSARTAN POTASSIUM 100 MG PO TABS: ORAL_TABLET | ORAL | 3 refills | Status: AC

## 2023-07-29 NOTE — Progress Notes (Signed)
Cardiology Office Note:  .   Date:  07/29/2023  ID:  [name] [name], DOB [birth year], MRN [id number] PCP: Judy Pimple, MD  [facility] Providers Cardiologist:  Bryan Lemma, MD     Chief Complaint  Patient presents with   Follow-up    Patient states that Dr. Herbie Baltimore wanted to see her prior to her surgery. Patient states that she is feeling ok on today. Meds reviewed.     Patient Profile: .     [name] [name] is a 82 y.o. female with a PMH notable for CAD-PCI (PCI LCx-OM; Inf MI noted on Myoview), HTN, HLD, h/o TIA & chronic DOE (only nominal improvement after PCI) who presents here for 6 month & pre-op evaluation at the request of Judy Pimple, MD. [name] 2018: Cath-PCI LCx for abnormal Myoview (ischemic territory improved post-PCI) Known LBBB TIA April 2024     [name] [name] was last seen on January 25, 2023 as a hospital f/u for TIA. She was stable from a CV standpoint, but had ?s about meds.  ER visit was for Sx of bilateral arm weakness & near syncope, but denied slurred speech. NO Sx of palpitations/irregular heartbeats.  -> was on Plavix temporarily post TIA -> with plans to revert to ASA SAPT.  BB dose reduced to Lopressor 25 mg BID b/c bradycardia along with continued Losartan 50 mg & atorvastatin 40 mg. => plan to f/u Lipids with increased statin Chronic fatigue  & DOE along with mild pedal edema.  Subjective  Discussed the use of AI scribe software for clinical note transcription with the patient, who gave verbal consent to proceed.  History of Present Illness   The patient, an 82 year old with a history of CAD-PCI LCx, mini strokes and multiple orthopedic surgeries, HTN & HLD, presents for a 6 month & pre-operative evaluation for an upcoming shoulder surgery. The patient reports no recent mini stroke symptoms since the last episode in April. She is currently on Plavix, as prescribed by her neurologist following her recent TIA.  The patient has been living with  her daughters and reports a significant amount of stress due to family health issues, including a grandson with esophageal and stomach cancer and a daughter who recently had a stroke. She has been rotating between her 3 daughters & her son lives her old house. The patient acknowledges a decrease in energy levels and increased fatigue, but attributes this to stress and age rather than a specific medical condition.  The patient uses a cane for mobility and reports difficulty with initial movements after standing from a seated position, attributing this to muscle weakness rather than lack of energy. She expresses concern about potential falls and has a walker for use in longer distances. The patient denies any chest pain, pressure, or tightness (with rest or exertion), and reports no shortness of breath when lying flat or waking up. She prefers not to lay flat, but attributes this to habit rather than discomfort or difficulty breathing.  The patient denies any swelling in her legs and reports no current use of a diuretic. She has a history of elevated feet during sleep due to the use of adjustable beds. The patient denies any feelings of heart racing, skipping, or flip-flopping beats. She reports no dizziness or wooziness, and no further stroke symptoms.  The patient has a history of multiple joint surgeries, including both hips, one knee, and one shoulder. The upcoming surgery is for the other (Right) shoulder. The patient  is not diabetic, has normal kidney function, and has one artery opened with a stent. She is currently on cholesterol medication. The patient's heart rate is reported as low, and she is on metoprolol, which has been reduced due to the low heart rate. The patient reports no angina symptoms or heart failure symptoms.     Pending R shoulder Arthroplasty  Cardiovascular ROS: no chest pain or dyspnea on exertion positive for - exercise intolerance with fatigue and some shortness of  breath-remains deconditioned., but more limited by MSK symptoms.  End of day edema seems to be totally controlled. negative for - irregular heartbeat, orthopnea, palpitations, paroxysmal nocturnal dyspnea, rapid heart rate, or lightheadedness or dizziness or wooziness, syncope or near syncope, TIA/CVA or amaurosis fugax, claudication  ROS:  Review of Systems - Negative except deconditioning with some malaise and exercise intolerance.  Lots of stress and anxiety, especially being out of her element in her own home.  Mild memory loss.  Musculoskeletal aches and pains as noted above.    Objective   Current Meds  Medication Sig   acetaminophen (TYLENOL) 500 MG tablet Take 1,000 mg by mouth 2 (two) times daily as needed for moderate pain or headache.    albuterol (VENTOLIN HFA) 108 (90 Base) MCG/ACT inhaler INHALE 1 PUFF INTO THE LUNGS EVERY 6 (SIX) HOURS AS NEEDED (FOR COUGH).   atorvastatin (LIPITOR) 40 MG tablet Take 1 tablet (40 mg total) by mouth daily.   benzonatate (TESSALON) 200 MG capsule Take 1 capsule (200 mg total) by mouth 3 (three) times daily as needed for cough.   busPIRone (BUSPAR) 15 MG tablet TAKE 1 TABLET BY MOUTH EVERY DAY   cetirizine (ZYRTEC) 10 MG tablet TAKE 1 TABLET BY MOUTH EVERY DAY   clopidogrel (PLAVIX) 75 MG tablet TAKE 1 TABLET BY MOUTH EVERY DAY   fluticasone (FLONASE) 50 MCG/ACT nasal spray PLACE 1 SPRAY INTO BOTH NOSTRILS DAILY AS NEEDED.   HYDROcodone-acetaminophen (NORCO) 7.5-325 MG tablet Take 1 tablet by mouth 3 (three) times daily as needed.   Melatonin 3 MG CAPS Take 6 mg by mouth at bedtime.    methocarbamol (ROBAXIN) 500 MG tablet TAKE 1 TABLET BY MOUTH EVERY 8 HOURS AS NEEDED FOR MUSCLE SPASMS   nitroGLYCERIN (NITROSTAT) 0.4 MG SL tablet Place 1 tablet (0.4 mg total) under the tongue every 5 (five) minutes as needed.   nystatin (MYCOSTATIN/NYSTOP) powder Apply topically 2 (two) times daily as needed. To affected areas to prevent yeast (in skin folds)    pantoprazole (PROTONIX) 40 MG tablet TAKE 1 TABLET BY MOUTH EVERY DAY   sertraline (ZOLOFT) 25 MG tablet TAKE 1 TABLET (25 MG TOTAL) BY MOUTH DAILY.   Vitamin D, Cholecalciferol, 25 MCG (1000 UT) CAPS Take 2,000 Units by mouth in the morning and at bedtime.    [DISCONTINUED] losartan () 50 MG tablet TAKE 1 TABLET BY MOUTH EVERY DAY   []  metoprolol tartrate (LOPRESSOR) 25 MG tablet TAKE 1 TABLET BY MOUTH TWICE A DAY    Studies Reviewed: Marland Kitchen   EKG Interpretation Date/Time:  Thursday July 29 2023 08:50:05 EST Ventricular Rate:  55 PR Interval:  216 QRS Duration:  122 QT Interval:  460 QTC Calculation: 440 R Axis:   -56  Text Interpretation: Sinus bradycardia with 1st degree A-V block Left axis deviation Left bundle branch block When compared with ECG of 22-Dec-2022 20:59, IVCD now more c/w LBBB (previously described) Confirmed by Bryan Lemma ([id number]) on 07/29/2023 9:01:22 AM    Cath-PCI Vanessa Kick  2018: mLCx 90% (DES PCI mLCx 90%--0% SYNERGY DES 2.75 MM X 16 MM), Ost D1 70% (not PCI target). Mod ostial and mD2. LAD tandem 50% stenoses w/ negative FFR (0.86). (Pre-PCI Myoview: EF ~35-40%. ? Prior Small Inferior-apical & septal infarct (w/o ischemia). Difuse HK - worse in inferoapex. Suggest prior infarct with no ischemia. Ischemic area correlates with Echo WMA)  Myoview 05/2021: Findings consistent with prior anteroapical infarct with fixed defect. No peri-infarct ischemia. Notable improvement from prior study  TTE 05/2020: Normal LV size and function. No or WMA. EF 60 to 65%. GR 1 DD with elevated LAP. Normal RV size and function. Normal RVP, RAP. Mild aortic valve calcification-sclerosis but no stenosis. Trivial AI. No embolic source  TTE 12/23/2022: Nl LVEF 60-65%.  No RWMA. Mild Conc LVH. GR 1 DD (normal for age).  Normal RV - RVP & RAP. Small pericardial effusion. Normal MV with mild AoV thickening.  Comparison(s): No significant change from prior study.   Lab Results  Component Value Date    CHOL 149 12/23/2022   HDL 42 12/23/2022   LDLCALC 76 12/23/2022   LDLDIRECT 75.0 12/02/2021   TRIG 154 (H) 12/23/2022   CHOLHDL 3.5 12/23/2022   Lab Results  Component Value Date   NA 139 07/15/2023   K 4.0 07/15/2023   CREATININE 1.20 (H) 07/15/2023   GFRNONAA 45 (L) 07/15/2023   GLUCOSE 112 (H) 07/15/2023   Lab Results  Component Value Date   HGBA1C 5.6 06/11/2023      Latest Ref Rng & Units 07/15/2023   10:38 AM 04/15/2023    2:25 PM 01/13/2023    9:32 AM  CBC  WBC 4.0 - 10.5 K/uL 5.3  6.1  6.8   Hemoglobin 12.0 - 15.0 g/dL 9.9  29.5  18.8   Hematocrit 36.0 - 46.0 % 31.4  31.5  33.5   Platelets 150 - 400 K/uL 235  265  209     Risk Assessment/Calculations:               07/29/23 1414  Revised Cardiac Risk Index (RCRI)  High Risk Surgery?  0  History of Ischemic Heart Disease? 0 (s/p PCI for + ST; no further Sx)  History of Congestive Heart Failure? 0  History of Cerebrovascular Disease? 1  Pre-Op Treatment w/ Insulin? 0  Pre-Operative Creatinine > 2 mg/dL? 0  Total Points 1  Perioperative Risk of Major Cardiac Event is (%) 0.9     07/29/23 1416  Duke Activity Status Index (DASI)  Can you take care of yourself? 2.75  Can you walk indoors? 1.75  Can you walk 1-2 blocks on level ground? 2.75  Can you climb a flight of stairs or walk up a hill? 5.5  Can you run a short distance? 0 (msk restrictions)  Can you do light work around the house? 2.7  Can you do moderate work around the house? 3.5  Can you do heavy work around the house? 0  Can you do yardwork? 0 (MSK issues)  Are you able to have sexual relations? 0  Are you able to participate in moderate recreational activities? 0  Are you able to participate in strenuous sports? 0  DASI Score 18.95  Functional Capacity in METs is 5.07    Ms. Sole's perioperative risk of a major cardiac event is 0.9% according to the Revised Cardiac Risk Index (RCRI).  Therefore, she is at low risk for perioperative  complications.   Her functional capacity is fair at  5.07 METs according to the Duke Activity Status Index (DASI). Recommendations: According to ACC/AHA guidelines, no further cardiovascular testing needed.  The patient may proceed to surgery at acceptable risk.   Antiplatelet and/or Anticoagulation Recommendations: Aspirin can be held for 5-7 days prior to her surgery.  Please resume Aspirin post operatively when it is felt to be safe from a bleeding standpoint.  Clopidogrel (Plavix) can be held for 5-7 days prior to her surgery and resumed as soon as possible post op. Plavix was prescribed by Neuro post TIA -- if stopped, will be on ASA   Physical Exam:   VS:  BP 129/71 (BP Location: Left Arm, Patient Position: Sitting, Cuff Size: Normal)   Pulse (!) 55   Ht 5' 2.75" (1.594 m)   Wt 179 lb 6.4 oz (81.4 kg)   SpO2 96%   BMI 32.03 kg/m    Wt Readings from Last 3 Encounters:  07/29/23 179 lb 6.4 oz (81.4 kg)  07/15/23 176 lb 1.6 oz (79.9 kg)  06/11/23 172 lb 8 oz (78.2 kg)    GEN: Well nourished, well developed in no acute distress; mildly obese but well-groomed and healthy-appearing for stated age.  Walks with a walker. NECK: No JVD; No carotid bruits CARDIAC:  RRR, Normal S1, S2; soft 1/6 SEM at RUSB.  Otherwise no additional murmurs, rubs, gallops RESPIRATORY:  Clear to auscultation without rales, wheezing or rhonchi ; nonlabored, good air movement. ABDOMEN: Soft, non-tender, non-distended EXTREMITIES:  No edema; No deformity ; somewhat slow and deliberate gait.    ASSESSMENT AND PLAN: .    Problem List Items Addressed This Visit       Cardiology Problems   CAD S/P percutaneous coronary angioplasty - Primary (Chronic)    History PCI to the circumflex with mild to moderate disease elsewhere.  No longer on Plavix from a cardiac standpoint, he is on Plavix from a neurology standpoint.  When not on Plavix, would switch back to aspirin.  For now we will defer timing and duration of  Plavix treatment.   Okay to hold either Plavix or aspirin 5 to 7 days preop for surgeries or procedures.      Relevant Medications   losartan (COZAAR) 100 MG tablet   metoprolol succinate (TOPROL XL) 25 MG 24 hr tablet   Other Relevant Orders   EKG 12-Lead (Completed)   Coronary artery disease involving native heart without angina pectoris (Chronic)    Stable, no angina symptoms almost 7 years out from PCI.  Remains on on Plavix (switch from aspirin after TID) and cholesterol medication. Lipids checked in May, not optimal but acceptable for age. -Continue current medications. -Plan to reduce Lopressor (metoprolol tartrate) to 12.5 mg BID with plan to convert to Toprol XL (metoprolol succinate) -Increase Losartan dose to 100mg  daily to help manage blood pressure. -Continue 40 mg rosuvastatin      Relevant Medications   losartan (COZAAR) 100 MG tablet   metoprolol succinate (TOPROL XL) 25 MG 24 hr tablet   Essential hypertension (Chronic)    Plan to reduce Lopressor (metoprolol tartrate) to 12.5 mg BID with plan to convert to Toprol XL (metoprolol succinate) -Increase Losartan dose to 100mg  daily to help manage blood pressure.      Relevant Medications   losartan (COZAAR) 100 MG tablet   metoprolol succinate (TOPROL XL) 25 MG 24 hr tablet   Hyperlipidemia with target low density lipoprotein (LDL) cholesterol less than 70 mg/dL (Chronic)    Recent Lipid panel with LDL ~  45 => with advanced age, will consider this acceptable -Continue current dose of atorvastatin 40 mg daily.      Relevant Medications   losartan (COZAAR) 100 MG tablet   metoprolol succinate (TOPROL XL) 25 MG 24 hr tablet     Other   Anemia of chronic disease (Chronic)    Managed with regular injections, maintaining hemoglobin around 10. -Continue current treatment plan.      Chronic fatigue (Chronic)    Stable, long-standing issue.  Will continue to titrate beta-blocker down further.  Cut to 12.5 mg twice  daily Lopressor and then switch to 25 mg Toprol once current bottle is complete.      History of TIA (transient ischemic attack)    No recent symptoms since April. On Plavix as per neurologist's recommendation. -Continue Plavix until safe to stop for upcoming surgery.  Continue aggressive CV risk factor modification.      Pedal edema (Chronic)    Well-controlled with foot elevation.  Has not had to use any diuretic      Preop cardiovascular exam    She is now almost 7 years out from PCI, but has had a recent TIA for which she is back on Plavix. Will defer timing of Plavix treatment whether she stays on Plavix and goes back to aspirin to neurology.  Ms. Bisig's perioperative risk of a major cardiac event is 0.9% according to the Revised Cardiac Risk Index (RCRI).  Therefore, she is at low risk for perioperative complications.   Her functional capacity is fair at 5.07 METs according to the Duke Activity Status Index (DASI). Recommendations: According to ACC/AHA guidelines, no further cardiovascular testing needed.  The patient may proceed to surgery at acceptable risk.   Antiplatelet and/or Anticoagulation Recommendations: Aspirin can be held for 5-7 days prior to her surgery.  Please resume Aspirin post operatively when it is felt to be safe from a bleeding standpoint.  Clopidogrel (Plavix) can be held for 5-7 days prior to her surgery and resumed as soon as possible post op. Plavix was prescribed by Neuro post TIA -- if stopped, will be on ASA            Follow-Up: Return in about 1 year (around 07/28/2024). -Next annual checkup with primary doctor in May. -Plan to switch cardiology follow-up to November/December instead of May. -Check lipid profile at next primary care visit.    Total time spent: 31 min spent with patient + 46 min spent charting = 77 min   .  Signed, Marykay Lex, MD, MS Bryan Lemma, M.D., M.S. Interventional Cardiologist  [facility]   Pager # [phone] Phone # [phone] [address] [place], [place] [address]

## 2023-07-29 NOTE — Assessment & Plan Note (Signed)
History PCI to the circumflex with mild to moderate disease elsewhere.  No longer on Plavix from a cardiac standpoint, he is on Plavix from a neurology standpoint.  When not on Plavix, would switch back to aspirin.  For now we will defer timing and duration of Plavix treatment.   Okay to hold either Plavix or aspirin 5 to 7 days preop for surgeries or procedures.

## 2023-07-29 NOTE — Assessment & Plan Note (Signed)
Stable, no angina symptoms almost 7 years out from PCI.  Remains on on Plavix (switch from aspirin after TID) and cholesterol medication. Lipids checked in May, not optimal but acceptable for age. -Continue current medications. -Plan to reduce Lopressor (metoprolol tartrate) to 12.5 mg BID with plan to convert to Toprol XL (metoprolol succinate) -Increase Losartan dose to 100mg  daily to help manage blood pressure. -Continue 40 mg rosuvastatin

## 2023-07-29 NOTE — Assessment & Plan Note (Signed)
Plan to reduce Lopressor (metoprolol tartrate) to 12.5 mg BID with plan to convert to Toprol XL (metoprolol succinate) -Increase Losartan dose to 100mg  daily to help manage blood pressure.

## 2023-07-29 NOTE — Assessment & Plan Note (Signed)
No recent symptoms since April. On Plavix as per neurologist's recommendation. -Continue Plavix until safe to stop for upcoming surgery.  Continue aggressive CV risk factor modification.

## 2023-07-29 NOTE — Patient Instructions (Addendum)
Medication Instructions:  - Your physician has recommended you make the following change in your medication:   1) INCREASE Losartan to 100 mg: - take 1 tablet by mouth once daily in the morning You may use up your current supply of 50 mg tablets by taking 2 tablets (100 mg) once daily in the morning  2) DECREASE Metoprolol tartrate (Lopressor) 25 mg: - take 0.5 tablet (12.5) by mouth twice daily until gone then stop   3) START Metoprolol succinate (Toprol XL) 25 mg: - take 1 tablet by mouth once daily at bedtime  Start this dose once your current supply of metoprolol tartrate is completed  *If you need a refill on your cardiac medications before your next appointment, please call your pharmacy*   Lab Work: - none ordered  If you have labs (blood work) drawn today and your tests are completely normal, you will receive your results only by: MyChart Message (if you have MyChart) OR A paper copy in the mail If you have any lab test that is abnormal or we need to change your treatment, we will call you to review the results.   Testing/Procedures: - none ordered   Follow-Up: At [facility], you and your health needs are our priority.  As part of our continuing mission to provide you with exceptional heart care, we have created designated Provider Care Teams.  These Care Teams include your primary Cardiologist (physician) and Advanced Practice Providers (APPs -  Physician Assistants and Nurse Practitioners) who all work together to provide you with the care you need, when you need it.  We recommend signing up for the patient portal called "MyChart".  Sign up information is provided on this After Visit Summary.  MyChart is used to connect with patients for Virtual Visits (Telemedicine).  Patients are able to view lab/test results, encounter notes, upcoming appointments, etc.  Non-urgent messages can be sent to your provider as well.   To learn more about what you can do with  MyChart, go to [email or web address].    Your next appointment:   1 year(s)  Provider:   You may see Bryan Lemma, MD or one of the following Advanced Practice Providers on your designated Care Team:   Nicolasa Ducking, NP Eula Listen, PA-C Cadence Fransico Michael, PA-C Charlsie Quest, NP Carlos Levering, NP    Other Instructions N/a

## 2023-07-29 NOTE — Assessment & Plan Note (Signed)
Well-controlled with foot elevation.  Has not had to use any diuretic

## 2023-07-29 NOTE — Progress Notes (Signed)
   07/29/23 1416  Duke Activity Status Index (DASI)  Can you take care of yourself? 2.75  Can you walk indoors? 1.75  Can you walk 1-2 blocks on level ground? 2.75  Can you climb a flight of stairs or walk up a hill? 5.5  Can you run a short distance? 0 (msk restrictions)  Can you do light work around the house? 2.7  Can you do moderate work around the house? 3.5  Can you do heavy work around the house? 0  Can you do yardwork? 0 (MSK issues)  Are you able to have sexual relations? 0  Are you able to participate in moderate recreational activities? 0  Are you able to participate in strenuous sports? 0  DASI Score 18.95  Functional Capacity in METs is 5.07

## 2023-07-29 NOTE — Assessment & Plan Note (Signed)
She is now almost 7 years out from PCI, but has had a recent TIA for which she is back on Plavix. Will defer timing of Plavix treatment whether she stays on Plavix and goes back to aspirin to neurology.  Ms. Appelhans's perioperative risk of a major cardiac event is 0.9% according to the Revised Cardiac Risk Index (RCRI).  Therefore, she is at low risk for perioperative complications.   Her functional capacity is fair at 5.07 METs according to the Duke Activity Status Index (DASI). Recommendations: According to ACC/AHA guidelines, no further cardiovascular testing needed.  The patient may proceed to surgery at acceptable risk.   Antiplatelet and/or Anticoagulation Recommendations: Aspirin can be held for 5-7 days prior to her surgery.  Please resume Aspirin post operatively when it is felt to be safe from a bleeding standpoint.  Clopidogrel (Plavix) can be held for 5-7 days prior to her surgery and resumed as soon as possible post op. Plavix was prescribed by Neuro post TIA -- if stopped, will be on ASA

## 2023-07-29 NOTE — Progress Notes (Signed)
   07/29/23 1414  Revised Cardiac Risk Index (RCRI)  High Risk Surgery?  0  History of Ischemic Heart Disease? 0 (s/p PCI for + ST; no further Sx)  History of Congestive Heart Failure? 0  History of Cerebrovascular Disease? 1  Pre-Op Treatment w/ Insulin? 0  Pre-Operative Creatinine > 2 mg/dL? 0  Total Points 1  Perioperative Risk of Major Cardiac Event is (%) 0.9

## 2023-07-29 NOTE — Assessment & Plan Note (Signed)
Managed with regular injections, maintaining hemoglobin around 10. -Continue current treatment plan.

## 2023-07-29 NOTE — Assessment & Plan Note (Signed)
Recent Lipid panel with LDL ~ 76 => with advanced age, will consider this acceptable -Continue current dose of atorvastatin 40 mg daily.

## 2023-07-29 NOTE — Assessment & Plan Note (Signed)
Stable, long-standing issue.  Will continue to titrate beta-blocker down further.  Cut to 12.5 mg twice daily Lopressor and then switch to 25 mg Toprol once current bottle is complete.

## 2023-08-05 ENCOUNTER — Ambulatory Visit: Payer: Medicare Other | Admitting: Cardiology

## 2023-09-06 NOTE — H&P (Signed)
 Patient's anticipated LOS is less than 2 midnights, meeting these requirements: - Younger than 54 - Lives within 1 hour of care - Has a competent adult at home to recover with post-op recover - NO history of  - Chronic pain requiring opiods  - Diabetes  - Coronary Artery Disease  - Heart failure  - Heart attack  - Stroke  - DVT/VTE  - Cardiac arrhythmia  - Respiratory Failure/COPD  - Renal failure  - Anemia  - Advanced Liver disease     [name] [name] is an 83 y.o. female.    Chief Complaint: right shoulder pain  HPI: Pt is a 83 y.o. female complaining of right shoulder pain for multiple years. Pain had continually increased since the beginning. X-rays in the clinic show end-stage arthritic changes of the right shoulder. Pt has tried various conservative treatments which have failed to alleviate their symptoms, including injections and therapy. Various options are discussed with the patient. Risks, benefits and expectations were discussed with the patient. Patient understand the risks, benefits and expectations and wishes to proceed with surgery.   PCP:  Randeen Laine LABOR, MD  D/C Plans: Home  PMH: Past Medical History:  Diagnosis Date   Anemia    of chronic disease   Anxiety    Arthritis    psoratic arthritis; new dx (09/11/2016)   CAD S/P percutaneous coronary angioplasty 08/2016   mLCx 90% -> 0% (2.75 mm x 16 mm) Synergy DES PCI Ostial D1 70% (not PTCA target). Moderate ostial and mD2.  LAD tandem 50% stenoses w/ negative FFR (0.86)   Cancer (HCC)    skin cancer on leg   Chronic lower back pain    Complication of anesthesia 1980 and 1982   trouble waking up after breast biopsy, many surgeries since no problem   Depression    Dyspnea on effort - CHRONIC    partial relief of Sx post PCI of L Circumflex.   Fibrocystic breast    GERD (gastroesophageal reflux disease)    Headache    monthly (09/11/2016)   History of blood transfusion    several since age 63; I've  had them after most of my surgeries (09/11/2016)   History of myocardial infarction     - Myoview  [name] 2018 indicated small Inferior - Inferoseptal Infarct (vs. rest ischemia). Echo with inferoseptal hypokinesis.   Hx of skin cancer, basal cell 2013 and 2012   right inner, lower leg; several scattered around my body   Hypertension    Iron deficiency anemia    I've had an iron infusion   Osteoarthritis    Osteopenia    Peptic ulcer disease    H pylori   PMR (polymyalgia rheumatica) (HCC)    PONV (postoperative nausea and vomiting)    Psoriasis    TIA (transient ischemic attack) 06/03/2020   Symptoms of right arm weakness/numbness and slurred speech as well as right droopm => MRI brain/MRA head normal.  Normal echo normal carotid Dopplers.    PSH: Past Surgical History:  Procedure Laterality Date   APPENDECTOMY     BACK SURGERY     x 6    BREAST BIOPSY Left 1980 and 1982   benign   CARDIAC CATHETERIZATION N/A 09/11/2016   Procedure: Left Heart Cath and Coronary Angiography;  Surgeon: Alm LELON Clay, MD;  Location: [facility] INVASIVE CV LAB;  Service: Cardiovascular: mLCx 90% * (PCI). Ostial D1 70% (not PTCA target). Mod ostial and mD2.  LAD tandem 50% stenoses  w/ negative FFR (0.86)   CARDIAC CATHETERIZATION N/A 09/11/2016   Procedure: Intravascular Pressure Wire/FFR Study;  Surgeon: Alm LELON Clay, MD;  Location: [facility] INVASIVE CV LAB;  Service: Cardiovascular: --  LAD tandem 50% stenoses w/ negative FFR (0.86   CARDIAC CATHETERIZATION N/A 09/11/2016   Procedure: Coronary Stent Intervention;  Surgeon: Alm LELON Clay, MD;  Location: [facility] INVASIVE CV LAB;  Service: Cardiovascular: -- DES PCI mLCx 90%--0% SYNERGY DES 2.75 MM X 16 MM.   CARDIAC CATHETERIZATION  2001   non-obstructive CAD   CATARACT EXTRACTION W/ INTRAOCULAR LENS  IMPLANT, BILATERAL  July -  August 2017   DILATION AND CURETTAGE OF UTERUS  X 3   ESOPHAGOGASTRODUODENOSCOPY  03/2009   ulcer,HP, GERD stricture   EXCISIONAL  HEMORRHOIDECTOMY     HIP ARTHROPLASTY Left 2010   JOINT REPLACEMENT     KNEE ARTHROSCOPY Bilateral    LAPAROSCOPIC CHOLECYSTECTOMY  2005   LUMBAR DISC SURGERY  11/1984; 1987; 1989; 2004; 2005 X 2   deg. disk lumber spine    MOHS SURGERY Right    inside my lower leg   NASAL SINUS SURGERY  1970s   NM MYOVIEW  LTD  08/28/2016   INTERMEDIATE RISK (b/c reduced EF & ? small infarct) -- EF ~35-40%.  ? Prior Small Inferior-apical & septal infarct (w/o ischemia).  Difuse HK - worse in inferoapex.  Suggest prior infarct with no ischemia. Ischemic area correlates with echo WMA   NM MYOVIEW  LTD  05/27/2021   Findings consistent with prior anteroapical infarct with fixed defect.  No peri-infarct ischemia.  Notable improvement from prior study.:   ORIF FEMUR FRACTURE Right 01/04/2020   Procedure: OPEN REDUCTION INTERNAL FIXATION (ORIF) DISTAL FEMUR FRACTURE;  Surgeon: Fidel Rogue, MD;  Location: [facility] OR;  Service: Orthopedics;  Laterality: Right;   REVERSE SHOULDER ARTHROPLASTY Left 12/17/2017   Procedure: LEFT REVERSE SHOULDER ARTHROPLASTY;  Surgeon: Kay Kemps, MD;  Location: [facility] OR;  Service: Orthopedics;  Laterality: Left;   TOTAL HIP ARTHROPLASTY Right 04/04/2013   Procedure: RIGHT TOTAL HIP ARTHROPLASTY ANTERIOR APPROACH;  Surgeon: Donnice JONETTA Car, MD;  Location: [facility] ORS;  Service: Orthopedics;  Laterality: Right;   TOTAL KNEE ARTHROPLASTY Right 2009   TRANSTHORACIC ECHOCARDIOGRAM  1/'18; 1/'19   a) 1/'18: mild conc LVH. EF 45-50% - anteroseptal & inferoseptal HK. Mod AI.; b) 1/'19 - post PCI: (post PCI) --> EF normalized:  Normal LV systolic function; mild diastolic dysfunction; mild   LVH; sclerotic aortic valve with mild AI.  No RWMA   TRANSTHORACIC ECHOCARDIOGRAM  06/04/2020    for TIA: Normal LV size and function.  No or WMA.  EF 60 to 65%.  GR 1 DD with elevated LAP.  Normal RV size and function.  Normal RVP, RAP.  Mild aortic valve calcification-sclerosis but no stenosis.  Trivial AI.  No  embolic source   TUBAL LIGATION     VAGINAL HYSTERECTOMY  1970s   partial     Social History:  reports that she has never smoked. She has never been exposed to tobacco smoke. She has never used smokeless tobacco. She reports current alcohol  use. She reports that she does not use drugs. BMI: Estimated body mass index is 32.03 kg/m as calculated from the following:   Height as of 07/29/23: 5' 2.75 (1.594 m).   Weight as of 07/29/23: 81.4 kg.  Lab Results  Component Value Date   ALBUMIN 3.7 07/15/2023   Diabetes: Patient does not have a diagnosis of diabetes. Lab Results  Component Value Date   HGBA1C 5.6 06/11/2023     Smoking Status:   reports that she has never smoked. She has never been exposed to tobacco smoke. She has never used smokeless tobacco.    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   Current Outpatient Medications  Medication Sig Dispense Refill   acetaminophen  (TYLENOL ) 500 MG tablet Take 1,000 mg by mouth 2 (two) times daily as needed for moderate pain or headache.      atorvastatin  (LIPITOR) 40 MG tablet Take 1 tablet (40 mg total) by mouth daily. (Patient taking differently: Take 40 mg by mouth at bedtime.) 90 tablet 3   benzonatate  (TESSALON ) 200 MG capsule Take 1 capsule (200 mg total) by mouth 3 (three) times daily as needed for cough. 90 capsule 1   busPIRone  (BUSPAR ) 15 MG tablet TAKE 1 TABLET BY MOUTH EVERY DAY 90 tablet 3   cetirizine  (ZYRTEC ) 10 MG tablet TAKE 1 TABLET BY MOUTH EVERY DAY 90 tablet 1   Cholecalciferol  (VITAMIN D ) 50 MCG (2000 UT) tablet Take 2,000 Units by mouth  2 (two) times daily.     clopidogrel  (PLAVIX ) 75 MG tablet TAKE 1 TABLET BY MOUTH EVERY DAY 90 tablet 2   fluticasone  (FLONASE ) 50 MCG/ACT nasal spray PLACE 1 SPRAY INTO BOTH NOSTRILS DAILY AS NEEDED. 48 mL 1   HYDROcodone -acetaminophen  (NORCO) 7.5-325 MG tablet Take 1 tablet by mouth 3 (three) times daily as needed for severe pain (pain score 7-10).     losartan  (COZAAR ) 100 MG tablet Take 1 tablet (100 mg) by mouth once daily in the morning 90 tablet 3   Melatonin 3 MG CAPS Take 6 mg by mouth at bedtime.      methocarbamol  (ROBAXIN ) 500 MG tablet TAKE 1 TABLET BY MOUTH EVERY 8 HOURS AS NEEDED FOR MUSCLE SPASMS 90 tablet 2   metoprolol  succinate (TOPROL  XL) 25 MG 24 hr tablet Take 1 tablet (25 mg) by mouth once daily at bedtime 90 tablet 3   nitroGLYCERIN  (NITROSTAT ) 0.4 MG SL tablet Place 1 tablet (0.4 mg total) under the tongue every 5 (five) minutes as needed. 25 tablet 3   nystatin  (MYCOSTATIN /NYSTOP ) powder Apply topically 2 (two) times daily as needed. To affected areas to prevent yeast (in skin folds) 30 g 1   pantoprazole  (PROTONIX ) 40 MG tablet TAKE 1 TABLET BY MOUTH EVERY DAY 90 tablet 2   sertraline  (ZOLOFT ) 25 MG tablet TAKE 1 TABLET (25 MG TOTAL) BY MOUTH DAILY. 90 tablet 2    No results found for this or any previous visit (from the past 48 hours). No results found.  ROS: Pain with rom of the right upper  extremity  Physical Exam: Alert and oriented 83 y.o. female in no acute distress Cranial nerves 2-12 intact Cervical spine: full rom with no tenderness, nv intact distally Chest: active breath sounds bilaterally, no wheeze rhonchi or rales Heart: regular rate and rhythm, no murmur Abd: non tender non distended with active bowel sounds Hip is stable with rom  Right shoulder painful and weak rom Nv intact distally No  rashes or edema distally  Assessment/Plan Assessment: right shoulder cuff arthropathy  Plan:  Patient will undergo a right reverse total shoulder by  Dr. Kay at [facility] Risks benefits and expectations were discussed with the patient. Patient understand risks, benefits and expectations and wishes to proceed. Preoperative templating of the joint replacement has been completed, documented, and submitted to the Operating Room personnel in order to optimize intra-operative equipment management.   Arvella Fireman PA-C, MPAS [facility] is now [employer] [address], [address], [place], [place] [address] Phone: [phone] [email or web address] Facebook  [employer]

## 2023-09-07 ENCOUNTER — Other Ambulatory Visit: Payer: Self-pay | Admitting: Family Medicine

## 2023-09-07 ENCOUNTER — Ambulatory Visit: Payer: Self-pay | Admitting: Family Medicine

## 2023-09-07 NOTE — Telephone Encounter (Signed)
 2 days of s/s of covid, covid + with at home test. Pt would like paxlovid if possible.  Chief Complaint: variety of s/s Symptoms: chills, N/V/D, trembling Frequency: yesterday Pertinent Negatives: Patient denies SOB, denies CP, denies GU s/s,  Disposition: [] ED /[] Urgent Care (no appt availability in office) / [] Appointment(In office/virtual)/ []  [facility]/ [x] Home Care/ [] Refused Recommended Disposition /[] [facility]/ []  Follow-up with PCP Additional Notes: Pt c/o N/V/D, states some weakness, pt expereiences weakness at baseline.  While on call pt administered Covid test, was positive. Pt does not believe she received the covid vaccine. Pt advised of using OTC medications in conjunction with her NORCO. Pt and family members understand. Pt and family reassured. Given home care instructions per Epic. Pt and family understand. Pt will be with family and not alone. Pt family states they will call back if the patient experiencing worsening of s/s. Family also advised to call back should they want the pt to be seen.  Pt agreeable. Pt advised to increase fluid intake. Pt and family had checked pt temp prior to calling triage, temp 99.9, pt was taking hydrocodone  during the phone triage.   Copied from CRM [id number]. Topic: Clinical - Red Word Triage >> Sep 07, 2023  4:22 PM Burnard DEL wrote: Red Word that prompted transfer to Nurse Triage: headache,shivers,diarrhea Reason for Disposition  Mild weakness or fatigue with acute minor illness (e.g., colds)  Answer Assessment - Initial Assessment Questions 1. DESCRIPTION: Describe how you are feeling.     N/V/D, chills 2. SEVERITY: How bad is it?  Can you stand and walk?   - MILD (0-3): Feels weak or tired, but does not interfere with work, school or normal activities.   - MODERATE (4-7): Able to stand and walk; weakness interferes with work, school, or normal activities.   - SEVERE (8-10): Unable to stand or walk; unable to do  usual activities.     Moderate, baseline is mild to moderate 3. ONSET: When did these symptoms begin? (e.g., hours, days, weeks, months)     Diarrhea started yesterday, chills started about 1500 today 4. CAUSE: What do you think is causing the weakness or fatigue? (e.g., not drinking enough fluids, medical problem, trouble sleeping)     + covid test 5. NEW MEDICINES:  Have you started on any new medicines recently? (e.g., opioid pain medicines, benzodiazepines, muscle relaxants, antidepressants, antihistamines, neuroleptics, beta blockers)     Denies  6. OTHER SYMPTOMS: Do you have any other symptoms? (e.g., chest pain, fever, cough, SOB, vomiting, diarrhea, bleeding, other areas of pain)     N/V/D, shoulder pain-normal for her, needs surgery  Protocols used: Weakness (Generalized) and Fatigue-A-AH

## 2023-09-07 NOTE — Telephone Encounter (Signed)
 Last filled on 06/07/23 #90 tabs/ 2 refill  CPE 12/23/23

## 2023-09-08 NOTE — Telephone Encounter (Signed)
 We don't prescribed med without an appt., please schedule appt with 1st available provider for Covid +

## 2023-09-08 NOTE — Telephone Encounter (Signed)
 Lvm for pt to call the office.

## 2023-09-09 ENCOUNTER — Encounter (HOSPITAL_COMMUNITY)
Admission: RE | Admit: 2023-09-09 | Discharge: 2023-09-09 | Disposition: A | Discharge status: Home / Self Care | Payer: Medicare Other | Source: Ambulatory Visit | Attending: Orthopedic Surgery | Admitting: Orthopedic Surgery

## 2023-09-09 DIAGNOSIS — Z01818 Encounter for other preprocedural examination: Secondary | ICD-10-CM

## 2023-09-09 DIAGNOSIS — I2510 Atherosclerotic heart disease of native coronary artery without angina pectoris: Secondary | ICD-10-CM

## 2023-09-22 ENCOUNTER — Other Ambulatory Visit: Payer: Self-pay | Admitting: Family Medicine

## 2023-09-22 NOTE — Telephone Encounter (Signed)
Last filled on 05/12/23 #90 tabs/ 1 refill   CPE scheduled 12/23/23

## 2023-10-12 DIAGNOSIS — Z79891 Long term (current) use of opiate analgesic: Secondary | ICD-10-CM | POA: Diagnosis not present

## 2023-10-12 DIAGNOSIS — G894 Chronic pain syndrome: Secondary | ICD-10-CM | POA: Diagnosis not present

## 2023-10-12 DIAGNOSIS — M5459 Other low back pain: Secondary | ICD-10-CM | POA: Diagnosis not present

## 2023-10-12 NOTE — Progress Notes (Signed)
Anesthesia Review:  PCP: marne tower LOV 06/11/23- preop clearance  Cardiologist : Onalee Hua harding  LOV 07/29/23  Chest x-ray : 12/22/22- 1 view  EKG : 07/29/23  Echo : 12/23/22  Stress test: 05/26/21  Cardiac Cath :  2018  Activity level:  Sleep Study/ CPAP : Fasting Blood Sugar :      / Checks Blood Sugar -- times a day:   Blood Thinner/ Instructions /Last Dose: ASA / Instructions/ Last Dose :

## 2023-10-13 NOTE — Patient Instructions (Signed)
SURGICAL WAITING ROOM VISITATION  Patients having surgery or a procedure may have no more than 2 support people in the waiting area - these visitors may rotate.    Children under the age of 64 must have an adult with them who is not the patient.  Due to an increase in RSV and influenza rates and associated hospitalizations, children ages 58 and under may not visit patients in [facility] hospitals.  Visitors with respiratory illnesses are discouraged from visiting and should remain at home.  If the patient needs to stay at the hospital during part of their recovery, the visitor guidelines for inpatient rooms apply. Pre-op nurse will coordinate an appropriate time for 1 support person to accompany patient in pre-op.  This support person may not rotate.    Please refer to the [facility] website for the visitor guidelines for Inpatients (after your surgery is over and you are in a regular room).       Your procedure is scheduled on:  10/22/2023    Report to [facility] Main Entrance    Report to admitting at   1030AM   Call this number if you have problems the morning of surgery [phone]   Do not eat food :After Midnight.   After Midnight you may have the following liquids until _ 1000_____ AM  DAY OF SURGERY  Water Non-Citrus Juices (without pulp, NO RED-Apple, White grape, White cranberry) Black Coffee (NO MILK/CREAM OR CREAMERS, sugar ok)  Clear Tea (NO MILK/CREAM OR CREAMERS, sugar ok) regular and decaf                             Plain Jell-O (NO RED)                                           Fruit ices (not with fruit pulp, NO RED)                                     Popsicles (NO RED)                                                               Sports drinks like Gatorade (NO RED)                     The day of surgery:  Drink ONE (1) Pre-Surgery Clear Ensure or G2 at   1000AM ( have completed by )  the morning of surgery. Drink in one sitting. Do not sip.   This drink was given to you during your hospital  pre-op appointment visit. Nothing else to drink after completing the  Pre-Surgery Clear Ensure or G2.          If you have questions, please contact your surgeon's office.       Oral Hygiene is also important to reduce your risk of infection.  Remember - BRUSH YOUR TEETH THE MORNING OF SURGERY WITH YOUR REGULAR TOOTHPASTE  DENTURES WILL BE REMOVED PRIOR TO SURGERY PLEASE DO NOT APPLY "Poly grip" OR ADHESIVES!!!   Do NOT smoke after Midnight   Stop all vitamins and herbal supplements 7 days before surgery.   Take these medicines the morning of surgery with A SIP OF WATER:  buspar, zyrtec, flonase if needed, protonix, zoloft   DO NOT TAKE ANY ORAL DIABETIC MEDICATIONS DAY OF YOUR SURGERY  Bring CPAP mask and tubing day of surgery.                              You may not have any metal on your body including hair pins, jewelry, and body piercing             Do not wear make-up, lotions, powders, perfumes/cologne, or deodorant  Do not wear nail polish including gel and S&S, artificial/acrylic nails, or any other type of covering on natural nails including finger and toenails. If you have artificial nails, gel coating, etc. that needs to be removed by a nail salon please have this removed prior to surgery or surgery may need to be canceled/ delayed if the surgeon/ anesthesia feels like they are unable to be safely monitored.   Do not shave  48 hours prior to surgery.               Men may shave face and neck.   Do not bring valuables to the hospital. [facility] IS NOT             RESPONSIBLE   FOR VALUABLES.   Contacts, glasses, dentures or bridgework may not be worn into surgery.   Bring small overnight bag day of surgery.   DO NOT BRING YOUR HOME MEDICATIONS TO THE HOSPITAL. PHARMACY WILL DISPENSE MEDICATIONS LISTED ON YOUR MEDICATION LIST TO YOU DURING YOUR ADMISSION IN THE  HOSPITAL!    Patients discharged on the day of surgery will not be allowed to drive home.  Someone NEEDS to stay with you for the first 24 hours after anesthesia.   Special Instructions: Bring a copy of your healthcare power of attorney and living will documents the day of surgery if you haven't scanned them before.              Please read over the following fact sheets you were given: IF YOU HAVE QUESTIONS ABOUT YOUR PRE-OP INSTRUCTIONS PLEASE CALL [phone]   If you received a COVID test during your pre-op visit  it is requested that you wear a mask when out in public, stay away from anyone that may not be feeling well and notify your surgeon if you develop symptoms. If you test positive for Covid or have been in contact with anyone that has tested positive in the last 10 days please notify you surgeon.      Pre-operative 5 CHG Bath Instructions   You can play a key role in reducing the risk of infection after surgery. Your skin needs to be as free of germs as possible. You can reduce the number of germs on your skin by washing with CHG (chlorhexidine gluconate) soap before surgery. CHG is an antiseptic soap that kills germs and continues to kill germs even after washing.   DO NOT use if you have an allergy to chlorhexidine/CHG or antibacterial soaps. If your skin becomes reddened or irritated, stop using the CHG and  notify one of our RNs at [phone].   Please shower with the CHG soap starting 4 days before surgery using the following schedule:     Please keep in mind the following:  DO NOT shave, including legs and underarms, starting the day of your first shower.   You may shave your face at any point before/day of surgery.  Place clean sheets on your bed the day you start using CHG soap. Use a clean washcloth (not used since being washed) for each shower. DO NOT sleep with pets once you start using the CHG.   CHG Shower Instructions:  If you choose to wash your hair and  private area, wash first with your normal shampoo/soap.  After you use shampoo/soap, rinse your hair and body thoroughly to remove shampoo/soap residue.  Turn the water OFF and apply about 3 tablespoons (45 ml) of CHG soap to a CLEAN washcloth.  Apply CHG soap ONLY FROM YOUR NECK DOWN TO YOUR TOES (washing for 3-5 minutes)  DO NOT use CHG soap on face, private areas, open wounds, or sores.  Pay special attention to the area where your surgery is being performed.  If you are having back surgery, having someone wash your back for you may be helpful. Wait 2 minutes after CHG soap is applied, then you may rinse off the CHG soap.  Pat dry with a clean towel  Put on clean clothes/pajamas   If you choose to wear lotion, please use ONLY the CHG-compatible lotions on the back of this paper.     Additional instructions for the day of surgery: DO NOT APPLY any lotions, deodorants, cologne, or perfumes.   Put on clean/comfortable clothes.  Brush your teeth.  Ask your nurse before applying any prescription medications to the skin.      CHG Compatible Lotions   Aveeno Moisturizing lotion  Cetaphil Moisturizing Cream  Cetaphil Moisturizing Lotion  Clairol Herbal Essence Moisturizing Lotion, Dry Skin  Clairol Herbal Essence Moisturizing Lotion, Extra Dry Skin  Clairol Herbal Essence Moisturizing Lotion, Normal Skin  Curel Age Defying Therapeutic Moisturizing Lotion with Alpha Hydroxy  Curel Extreme Care Body Lotion  Curel Soothing Hands Moisturizing Hand Lotion  Curel Therapeutic Moisturizing Cream, Fragrance-Free  Curel Therapeutic Moisturizing Lotion, Fragrance-Free  Curel Therapeutic Moisturizing Lotion, Original Formula  Eucerin Daily Replenishing Lotion  Eucerin Dry Skin Therapy Plus Alpha Hydroxy Crme  Eucerin Dry Skin Therapy Plus Alpha Hydroxy Lotion  Eucerin Original Crme  Eucerin Original Lotion  Eucerin Plus Crme Eucerin Plus Lotion  Eucerin TriLipid Replenishing Lotion  Keri  Anti-Bacterial Hand Lotion  Keri Deep Conditioning Original Lotion Dry Skin Formula Softly Scented  Keri Deep Conditioning Original Lotion, Fragrance Free Sensitive Skin Formula  Keri Lotion Fast Absorbing Fragrance Free Sensitive Skin Formula  Keri Lotion Fast Absorbing Softly Scented Dry Skin Formula  Keri Original Lotion  Keri Skin Renewal Lotion Keri Silky Smooth Lotion  Keri Silky Smooth Sensitive Skin Lotion  Nivea Body Creamy Conditioning Oil  Nivea Body Extra Enriched Lotion  Nivea Body Original Lotion  Nivea Body Sheer Moisturizing Lotion Nivea Crme  Nivea Skin Firming Lotion  NutraDerm 30 Skin Lotion  NutraDerm Skin Lotion  NutraDerm Therapeutic Skin Cream  NutraDerm Therapeutic Skin Lotion  ProShield Protective Hand Cream  Provon moisturizing lotion   [place]- Preparing for Total Shoulder Arthroplasty    Before surgery, you can play an important role. Because skin is not sterile, your skin needs to be as free of germs as possible. You  can reduce the number of germs on your skin by using the following products. Benzoyl Peroxide Gel Reduces the number of germs present on the skin Applied twice a day to shoulder area starting two days before surgery    ==================================================================  Please follow these instructions carefully:  BENZOYL PEROXIDE 5% GEL  Please do not use if you have an allergy to benzoyl peroxide.   If your skin becomes reddened/irritated stop using the benzoyl peroxide.  Starting two days before surgery, apply as follows: Apply benzoyl peroxide in the morning and at night. Apply after taking a shower. If you are not taking a shower clean entire shoulder front, back, and side along with the armpit with a clean wet washcloth.  Place a quarter-sized dollop on your shoulder and rub in thoroughly, making sure to cover the front, back, and side of your shoulder, along with the armpit.   2 days before ____ AM   ____ PM               1 day before ____ AM   ____ PM                         Do this twice a day for two days.  (Last application is the night before surgery, AFTER using the CHG soap as described below).  Do NOT apply benzoyl peroxide gel on the day of surgery.

## 2023-10-14 ENCOUNTER — Inpatient Hospital Stay: Payer: Medicare Other

## 2023-10-14 ENCOUNTER — Inpatient Hospital Stay: Payer: Medicare Other | Admitting: Hematology and Oncology

## 2023-10-15 ENCOUNTER — Encounter (HOSPITAL_COMMUNITY)
Admission: RE | Admit: 2023-10-15 | Discharge: 2023-10-15 | Disposition: A | Discharge status: Home / Self Care | Payer: Medicare Other | Source: Ambulatory Visit | Attending: Orthopedic Surgery | Admitting: Orthopedic Surgery

## 2023-10-15 ENCOUNTER — Inpatient Hospital Stay: Payer: Medicare Other | Attending: Hematology and Oncology

## 2023-10-15 ENCOUNTER — Inpatient Hospital Stay: Payer: Medicare Other

## 2023-10-15 ENCOUNTER — Inpatient Hospital Stay (HOSPITAL_BASED_OUTPATIENT_CLINIC_OR_DEPARTMENT_OTHER): Payer: Medicare Other | Admitting: Hematology and Oncology

## 2023-10-15 ENCOUNTER — Encounter (HOSPITAL_COMMUNITY): Payer: Self-pay

## 2023-10-15 ENCOUNTER — Other Ambulatory Visit: Payer: Self-pay

## 2023-10-15 VITALS — BP 146/52 | HR 65 | Temp 97.7°F | Resp 18 | Ht 62.75 in | Wt 177.4 lb

## 2023-10-15 DIAGNOSIS — D649 Anemia, unspecified: Secondary | ICD-10-CM | POA: Diagnosis not present

## 2023-10-15 DIAGNOSIS — Z01812 Encounter for preprocedural laboratory examination: Secondary | ICD-10-CM | POA: Diagnosis not present

## 2023-10-15 DIAGNOSIS — F419 Anxiety disorder, unspecified: Secondary | ICD-10-CM | POA: Diagnosis not present

## 2023-10-15 DIAGNOSIS — M353 Polymyalgia rheumatica: Secondary | ICD-10-CM | POA: Insufficient documentation

## 2023-10-15 DIAGNOSIS — M129 Arthropathy, unspecified: Secondary | ICD-10-CM | POA: Diagnosis not present

## 2023-10-15 DIAGNOSIS — Z8711 Personal history of peptic ulcer disease: Secondary | ICD-10-CM | POA: Insufficient documentation

## 2023-10-15 DIAGNOSIS — K219 Gastro-esophageal reflux disease without esophagitis: Secondary | ICD-10-CM | POA: Insufficient documentation

## 2023-10-15 DIAGNOSIS — D509 Iron deficiency anemia, unspecified: Secondary | ICD-10-CM | POA: Insufficient documentation

## 2023-10-15 DIAGNOSIS — Z6832 Body mass index (BMI) 32.0-32.9, adult: Secondary | ICD-10-CM | POA: Diagnosis not present

## 2023-10-15 DIAGNOSIS — D638 Anemia in other chronic diseases classified elsewhere: Secondary | ICD-10-CM

## 2023-10-15 DIAGNOSIS — Z7902 Long term (current) use of antithrombotics/antiplatelets: Secondary | ICD-10-CM | POA: Insufficient documentation

## 2023-10-15 DIAGNOSIS — Z8673 Personal history of transient ischemic attack (TIA), and cerebral infarction without residual deficits: Secondary | ICD-10-CM | POA: Insufficient documentation

## 2023-10-15 DIAGNOSIS — I252 Old myocardial infarction: Secondary | ICD-10-CM | POA: Insufficient documentation

## 2023-10-15 DIAGNOSIS — I119 Hypertensive heart disease without heart failure: Secondary | ICD-10-CM | POA: Insufficient documentation

## 2023-10-15 DIAGNOSIS — Z01818 Encounter for other preprocedural examination: Secondary | ICD-10-CM

## 2023-10-15 DIAGNOSIS — F32A Depression, unspecified: Secondary | ICD-10-CM | POA: Diagnosis not present

## 2023-10-15 DIAGNOSIS — Z955 Presence of coronary angioplasty implant and graft: Secondary | ICD-10-CM | POA: Insufficient documentation

## 2023-10-15 DIAGNOSIS — I2510 Atherosclerotic heart disease of native coronary artery without angina pectoris: Secondary | ICD-10-CM | POA: Insufficient documentation

## 2023-10-15 DIAGNOSIS — E669 Obesity, unspecified: Secondary | ICD-10-CM | POA: Insufficient documentation

## 2023-10-15 HISTORY — DX: Pneumonia, unspecified organism: J18.9

## 2023-10-15 HISTORY — DX: Cerebral infarction, unspecified: I63.9

## 2023-10-15 LAB — COMPREHENSIVE METABOLIC PANEL
ALT: 27 U/L (ref 0–44)
AST: 41 U/L (ref 15–41)
Albumin: 3.6 g/dL (ref 3.5–5.0)
Alkaline Phosphatase: 54 U/L (ref 38–126)
Anion gap: 9 (ref 5–15)
BUN: 35 mg/dL — ABNORMAL HIGH (ref 8–23)
CO2: 23 mmol/L (ref 22–32)
Calcium: 9.1 mg/dL (ref 8.9–10.3)
Chloride: 103 mmol/L (ref 98–111)
Creatinine, Ser: 1.12 mg/dL — ABNORMAL HIGH (ref 0.44–1.00)
GFR, Estimated: 48 mL/min — ABNORMAL LOW (ref >60)
Glucose, Bld: 98 mg/dL (ref 70–99)
Potassium: 4.2 mmol/L (ref 3.5–5.1)
Sodium: 135 mmol/L (ref 135–145)
Total Bilirubin: 0.8 mg/dL (ref 0.0–1.2)
Total Protein: 7.3 g/dL (ref 6.5–8.1)

## 2023-10-15 LAB — CBC
HCT: 34.4 % — ABNORMAL LOW (ref 36.0–46.0)
Hemoglobin: 10.2 g/dL — ABNORMAL LOW (ref 12.0–15.0)
MCH: 28.2 pg (ref 26.0–34.0)
MCHC: 29.7 g/dL — ABNORMAL LOW (ref 30.0–36.0)
MCV: 95 fL (ref 80.0–100.0)
Platelets: 270 10*3/uL (ref 150–400)
RBC: 3.62 MIL/uL — ABNORMAL LOW (ref 3.87–5.11)
RDW: 12.9 % (ref 11.5–15.5)
WBC: 6.2 10*3/uL (ref 4.0–10.5)
nRBC: 0 % (ref 0.0–0.2)

## 2023-10-15 LAB — SURGICAL PCR SCREEN
MRSA, PCR: NEGATIVE
Staphylococcus aureus: POSITIVE — AB

## 2023-10-15 MED ORDER — EPOETIN ALFA-EPBX 40000 UNIT/ML IJ SOLN
40000.0000 [IU] | Freq: Once | INTRAMUSCULAR | Status: AC
  Administered 2023-10-15: 40000 [IU] via SUBCUTANEOUS
  Filled 2023-10-15: qty 1

## 2023-10-15 NOTE — Assessment & Plan Note (Signed)
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    Lab review  10/12/2017: Hemoglobin 10.2, MCV 94, normal WBC and platelet counts, creatinine 1.02 04/13/2019: Hemoglobin 9 06/03/20: Hb 11.2 (did not receive Retacrit) 06/27/2020: Hemoglobin 10.3 (receiving Retacrit injection) 10/15/2020: Hemoglobin 10.2 (receiving Retacrit) 04/17/2021: Hemoglobin 10.6 (receiving Retacrit) now planning to do the injection every 3 months  07/21/21: hemoglobin: 9.9 (receiving Retacrit) 04/23/2022: Hemoglobin 10.7, ferritin 63, iron saturation 14%, B12 575 07/15/2022: Hemoglobin 9.9 01/13/2023: Hemoglobin 10.6 04/15/2023: Hemoglobin 10.1 10/15/2023: Hemoglobin 10.2     Current treatment: Retacrit 40,000 units currently receiving it every 12 weeks. (Inj if Hb is less than 10.5) last Retacrit was given in Nov 2024    Return to clinic 11/01/2023 to recheck of blood work after shoulder replacement surgery and transfuse 1 unit of PRBC if necessary.

## 2023-10-15 NOTE — Progress Notes (Signed)
Patient Care Team: Tower, Audrie Gallus, MD as PCP - General Herbie Baltimore Piedad Climes, MD as PCP - Cardiology (Cardiology) Sheran Luz, MD as Consulting Physician (Physical Medicine and Rehabilitation) Zenovia Jordan, MD as Consulting Physician (Rheumatology) Haverstock, Elvin So, MD as Referring Physician (Dermatology) Serena Croissant, MD as Consulting Physician (Hematology and Oncology) Kathyrn Sheriff, [facility] (Inactive) as Pharmacist (Pharmacist)  DIAGNOSIS:  Encounter Diagnoses  Name Primary?   Normochromic normocytic anemia Yes   Anemia of chronic disease       CHIEF COMPLIANT: Follow-up of anemia of chronic disease on Retacrit  HISTORY OF PRESENT ILLNESS:   History of Present Illness   [name] [name] is a 83 year old female with anemia who presents for pre-operative evaluation and management of hemoglobin levels.  She is preparing for right shoulder surgery scheduled for next Friday. Her hemoglobin level today is 10.2, an improvement from 9.9 in November. She has a history of requiring blood transfusions post-operatively, as noted after her previous hip replacement surgery.  Her hemoglobin levels have been stable around 10, and she has not received an injection in the past three months. Her diet has improved as she now rotates living between her three daughters' homes, which has positively impacted her nutrition. Some of her daughters cook more than others, contributing to better dietary intake.  She no longer drives and has adjusted her medical appointments accordingly. She is a retired Runner, broadcasting/film/video and has a supportive family structure, with her middle daughter running the family business and her youngest daughter caring for her grandchildren.  She has significant pain in her right shoulder, which has not responded to previous injections, necessitating the upcoming surgery.         ALLERGIES:  is allergic to sulfonamide derivatives, ace inhibitors, hydrochlorothiazide, oxaprozin,  pregabalin, and amoxicillin-pot clavulanate.  MEDICATIONS:  Current Outpatient Medications  Medication Sig Dispense Refill   acetaminophen (TYLENOL) 500 MG tablet Take 1,000 mg by mouth 2 (two) times daily as needed for moderate pain or headache.      atorvastatin (LIPITOR) 40 MG tablet Take 1 tablet (40 mg total) by mouth daily. (Patient taking differently: Take 40 mg by mouth at bedtime.) 90 tablet 3   benzonatate (TESSALON) 200 MG capsule TAKE 1 CAPSULE (200 MG TOTAL) BY MOUTH 3 (THREE) TIMES DAILY AS NEEDED FOR COUGH. 90 capsule 2   busPIRone (BUSPAR) 15 MG tablet TAKE 1 TABLET BY MOUTH EVERY DAY 90 tablet 3   cetirizine (ZYRTEC) 10 MG tablet TAKE 1 TABLET BY MOUTH EVERY DAY 90 tablet 1   Cholecalciferol (VITAMIN D) 50 MCG (2000 UT) tablet Take 2,000 Units by mouth 2 (two) times daily.     clopidogrel (PLAVIX) 75 MG tablet TAKE 1 TABLET BY MOUTH EVERY DAY 90 tablet 2   fluticasone (FLONASE) 50 MCG/ACT nasal spray PLACE 1 SPRAY INTO BOTH NOSTRILS DAILY AS NEEDED. 48 mL 1   HYDROcodone-acetaminophen (NORCO) 7.5-325 MG tablet Take 1 tablet by mouth 3 (three) times daily as needed for severe pain (pain score 7-10).     losartan (COZAAR) 100 MG tablet Take 1 tablet (100 mg) by mouth once daily in the morning 90 tablet 3   Melatonin 3 MG CAPS Take 6 mg by mouth at bedtime.      methocarbamol (ROBAXIN) 500 MG tablet TAKE 1 TABLET BY MOUTH EVERY 8 HOURS AS NEEDED FOR MUSCLE SPASMS 90 tablet 2   metoprolol succinate (TOPROL XL) 25 MG 24 hr tablet Take 1 tablet (25 mg) by mouth  once daily at bedtime 90 tablet 3   nitroGLYCERIN (NITROSTAT) 0.4 MG SL tablet Place 1 tablet (0.4 mg total) under the tongue every 5 (five) minutes as needed. 25 tablet 3   nystatin (MYCOSTATIN/NYSTOP) powder Apply topically 2 (two) times daily as needed. To affected areas to prevent yeast (in skin folds) 30 g 1   pantoprazole (PROTONIX) 40 MG tablet TAKE 1 TABLET BY MOUTH EVERY DAY 90 tablet 2   sertraline (ZOLOFT) 25 MG  tablet TAKE 1 TABLET (25 MG TOTAL) BY MOUTH DAILY. 90 tablet 2   No current facility-administered medications for this visit.   Facility-Administered Medications Ordered in Other Visits  Medication Dose Route Frequency Provider Last Rate Last Admin   epoetin alfa-epbx (RETACRIT) injection 40,000 Units  40,000 Units Subcutaneous Once Serena Croissant, MD        PHYSICAL EXAMINATION: ECOG PERFORMANCE STATUS: 1 - Symptomatic but completely ambulatory  Vitals:   10/15/23 1219  BP: (!) 146/52  Pulse: 65  Resp: 18  Temp: 97.7 F (36.5 C)  SpO2: 98%   Filed Weights   10/15/23 1219  Weight: 177 lb 6.4 oz (80.5 kg)     LABORATORY DATA:  I have reviewed the data as listed    Latest Ref Rng & Units 10/15/2023   11:54 AM 07/15/2023   10:38 AM 06/11/2023   12:06 PM  CMP  Glucose 70 - 99 mg/dL 98  213  84   BUN 8 - 23 mg/dL 35  22  15   Creatinine 0.44 - 1.00 mg/dL 0.86  5.78  4.69   Sodium 135 - 145 mmol/L 135  139  140   Potassium 3.5 - 5.1 mmol/L 4.2  4.0  4.3   Chloride 98 - 111 mmol/L 103  108  103   CO2 22 - 32 mmol/L 23  26  29    Calcium 8.9 - 10.3 mg/dL 9.1  9.0  9.1   Total Protein 6.5 - 8.1 g/dL 7.3  6.7    Total Bilirubin 0.0 - 1.2 mg/dL 0.8  0.4    Alkaline Phos 38 - 126 U/L 54  60    AST 15 - 41 U/L 41  17    ALT 0 - 44 U/L 27  9      Lab Results  Component Value Date   WBC 6.2 10/15/2023   HGB 10.2 (L) 10/15/2023   HCT 34.4 (L) 10/15/2023   MCV 95.0 10/15/2023   PLT 270 10/15/2023   NEUTROABS 3.1 07/15/2023    ASSESSMENT & PLAN:  Anemia of chronic disease 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    Lab review  10/12/2017: Hemoglobin 10.2, MCV 94, normal WBC and platelet counts, creatinine 1.02 04/13/2019: Hemoglobin 9 06/03/20: Hb 11.2 (did not receive Retacrit) 06/27/2020: Hemoglobin 10.3 (receiving Retacrit  injection) 10/15/2020: Hemoglobin 10.2 (receiving Retacrit) 04/17/2021: Hemoglobin 10.6 (receiving Retacrit) now planning to do the injection every 3 months  07/21/21: hemoglobin: 9.9 (receiving Retacrit) 04/23/2022: Hemoglobin 10.7, ferritin 63, iron saturation 14%, B12 575 07/15/2022: Hemoglobin 9.9 01/13/2023: Hemoglobin 10.6 04/15/2023: Hemoglobin 10.1 10/15/2023: Hemoglobin 10.2     Current treatment: Retacrit 40,000 units currently receiving it every 12 weeks. (Inj if Hb is less than 10.5) last Retacrit was given in Nov 2024    Return to clinic 11/01/2023 to recheck of blood work after shoulder replacement surgery and transfuse 1 unit of PRBC  if necessary. Shoulder replacement surgery happening on 09/21/2023. ------------------------------------- Assessment and Plan    Preoperative Evaluation for Right Shoulder Surgery Scheduled for right shoulder surgery next Friday. Preoperative hemoglobin is 10.2, close to the intervention threshold. History of postoperative blood transfusions. Discussed potential intraoperative blood loss and transfusion need. Goal is to avoid postoperative complications. Transfusion may be necessary if hemoglobin drops below 10.5 post-surgery. - Administer hemoglobin injection today - Document potential need for blood transfusion during surgery - Recheck hemoglobin 10 days post-surgery on March 10th - Block one unit of blood for potential transfusion on March 10th  Chronic Anemia Chronic anemia with hemoglobin around 10. Last hemoglobin injection was three months ago; current level is 10.2. Improved diet and nutrition likely contributing to well-managed hemoglobin levels. Today's hemoglobin injection is primarily for upcoming surgery, not due to a significant drop in levels. Consider extending monitoring intervals if hemoglobin remains above 10.5. - Monitor hemoglobin levels every three months - Extend interval to six months if hemoglobin remains above 10.5 -  Administer hemoglobin injection today due to upcoming surgery  General Health Maintenance Improved nutritional status due to better dietary habits while living with her daughters, contributing to well-managed hemoglobin levels. - Encourage continued healthy eating habits  Follow-up - Recheck hemoglobin 10 days post-surgery on March 10th - Schedule follow-up appointment for blood work on March 10th - Block one unit of blood for potential transfusion on March 10th.          Orders Placed This Encounter  Procedures   CBC with Differential (Cancer Center Only)    Standing Status:   Future    Expiration Date:   10/14/2024   Ferritin    Standing Status:   Future    Expiration Date:   10/14/2024   Iron and Iron Binding Capacity ([facility] only)    Standing Status:   Future    Expiration Date:   10/14/2024   Sample to Blood Bank(Blood Bank Hold)    Standing Status:   Future    Expiration Date:   10/14/2024   The patient has a good understanding of the overall plan. she agrees with it. she will call with any problems that may develop before the next visit here. Total time spent: 30 mins including face to face time and time spent for planning, charting and co-ordination of care   Tamsen Meek, MD 10/15/23

## 2023-10-18 NOTE — Progress Notes (Signed)
 Case: [id number] Date/Time: 10/22/23 1255   Procedure: REVERSE SHOULDER ARTHROPLASTY (Right: Shoulder) - interscale block   Anesthesia type: Choice   Pre-op diagnosis: Right shoulder rotator cuff arthropathy   Location: [facility] / [facility]   Surgeons: Beverely Low, MD       DISCUSSION: [name] [name] is an 83 yo female who presents to PAT prior to surgery above. PMH of hx of MI and CAD s/p PCI in 2018, chronic DOE, hx of TIA (05/2020), GERD, hx of PUD, anemia, PMR, arthritis, anxiety, depression.  Prior anesthesia complications include PONV, prolonged emergence (remotely)  Patient follows with Hematology for chronic anemia. She was last seen by Dr. Pamelia Hoit on 10/15/23. Recommendations as follows: Preoperative Evaluation for Right Shoulder Surgery Scheduled for right shoulder surgery next Friday. Preoperative hemoglobin is 10.2, close to the intervention threshold. History of postoperative blood transfusions. Discussed potential intraoperative blood loss and transfusion need. Goal is to avoid postoperative complications. Transfusion may be necessary if hemoglobin drops below 10.5 post-surgery. - Administer hemoglobin injection today - Document potential need for blood transfusion during surgery - Recheck hemoglobin 10 days post-surgery on March 10th - Block one unit of blood for potential transfusion on March 10th"  She has hx of MI and CAD s/p PCI in 2018. Last seen by Dr. Herbie Baltimore on 07/29/23 for pre op clearance. All issues stable at that visit. Cleared for surgery:  "Preop cardiovascular exam  She is now almost 7 years out from PCI, but has had a recent TIA for which she is back on Plavix. Will defer timing of Plavix treatment whether she stays on Plavix and goes back to aspirin to neurology.   [name] [name]'s perioperative risk of a major cardiac event is 0.9% according to the Revised Cardiac Risk Index (RCRI).  Therefore, she is at low risk for perioperative complications.   Her functional  capacity is fair at 5.07 METs according to the [employer] Activity Status Index (DASI). Recommendations: According to ACC/AHA guidelines, no further cardiovascular testing needed.  The patient may proceed to surgery at acceptable risk.   Antiplatelet and/or Anticoagulation Recommendations: Aspirin can be held for 5-7 days prior to her surgery.  Please resume Aspirin post operatively when it is felt to be safe from a bleeding standpoint.  Clopidogrel (Plavix) can be held for 5-7 days prior to her surgery and resumed as soon as possible post op. Plavix was prescribed by Neuro post TIA -- if stopped, will be on ASA"  Follows with PCP. Last seen on 06/11/23 for clearance with recommendations as follows:  "Pre-operative clearance - Primary    For right reverse shoulder total arthroplasty with Dr Ranell Patrick  Likely gen anesth  Complex medical pt  Will need clearance from heme (anemia) and cardiology (multiple) History of CVA on plavix- will need cardiology input re holding this  Poor exercise tolerance Obese  HTN is well controlled  Labs today for chem and A1c"   Plavix- Stop 5 days prior per pt  VS: BP 136/70   Pulse 67   Temp 36.9 C (Oral)   Resp 16   Ht 5' 2.75" (1.594 m)   SpO2 98%   BMI 32.03 kg/m   PROVIDERS: Tower, Audrie Gallus, MD Cardiologist : Bryan Lemma, MD Hematology: Serena Croissant, MD  LABS: Labs reviewed: Acceptable for surgery. (all labs ordered are listed, but only abnormal results are displayed)  Labs Reviewed  SURGICAL PCR SCREEN - Abnormal; Notable for the following components:      Result Value  Staphylococcus aureus POSITIVE (*)    All other components within normal limits  CBC - Abnormal; Notable for the following components:   RBC 3.62 (*)    Hemoglobin 10.2 (*)    HCT 34.4 (*)    MCHC 29.7 (*)    All other components within normal limits  COMPREHENSIVE METABOLIC PANEL - Abnormal; Notable for the following components:   BUN 35 (*)    Creatinine, Ser 1.12  (*)    GFR, Estimated 48 (*)    All other components within normal limits     IMAGES:   EKG:   CV:  Echo 12/23/22:  IMPRESSIONS    1. Left ventricular ejection fraction, by estimation, is 60 to 65%. The left ventricle has normal function. Left ventricular endocardial border not optimally defined to evaluate regional wall motion. There is mild concentric left ventricular hypertrophy. Left ventricular diastolic parameters are consistent with Grade I diastolic dysfunction (impaired relaxation).  2. Right ventricular systolic function is hyperdynamic. The right ventricular size is normal.  3. A small pericardial effusion is present. The pericardial effusion is circumferential.  4. The mitral valve was not well visualized. No evidence of mitral valve regurgitation.  5. The aortic valve is tricuspid. There is mild calcification of the aortic valve. There is mild thickening of the aortic valve. Aortic valve regurgitation is trivial.  6. The inferior vena cava is normal in size with greater than 50% respiratory variability, suggesting right atrial pressure of 3 mmHg.  Comparison(s): No significant change from prior study.  Stress test 05/26/2021:  Findings are consistent with prior myocardial infarction.   No ST deviation was noted.   Left ventricular function is normal. End diastolic cavity size is normal. End systolic cavity size is normal.   Prior study available for comparison from 08/28/2016. Improvement in apical perfusion   Findings: Negative for stress induced arrhythmias. No evidence of ischemia. There is evidence of an apical inferior perfusion defect in rest and stress with true apical and apical inferior wall motion abnormalities.    Conclusions: Stress test is negative for infarction. This is consistent with a low-moderate risk (due to mild perfusion defect). Improvement in perfusion and function from prior study.  LHC 09/11/2016:   Mid Cx lesion, 90  %stenosed. A STENT SYNERGY DES [id number] drug eluting stent was successfully placed. Post intervention, there is a 0% residual stenosis. _________________________________________ Suezanne Jacquet 1st Diag lesion, 70 %stenosed. - Not good PCI/PTCA target Ost 2nd Diag to 2nd Diag lesion, 40 %stenosed. Ost 3rd Diag to 3rd Diag lesion, 60 %stenosed. Ost LAD lesion, 50 %stenosed. Dist LAD lesion, 50 %stenosed. - Combined FFR 0.86-0.7 LV end diastolic pressure is mildly elevated.   Successful DES stenting of the most significant culprit lesion in the circumflex without complication.   Past Medical History:  Diagnosis Date   Anemia    of chronic disease   Anxiety    Arthritis    "psoratic arthritis; new dx" (09/11/2016)   CAD S/P percutaneous coronary angioplasty 08/2016   mLCx 90% -> 0% (2.75 mm x 16 mm) Synergy DES PCI Ostial D1 70% (not PTCA target). Moderate ostial and mD2.  LAD tandem 50% stenoses w/ negative FFR (0.86)   Cancer (HCC)    skin cancer on leg   Chronic lower back pain    Complication of anesthesia 1980 and 1982   trouble waking up after breast biopsy, many surgeries since no problem   Depression    Dyspnea    Dyspnea on effort -  CHRONIC    partial relief of Sx post PCI of L Circumflex.   Fibrocystic breast    GERD (gastroesophageal reflux disease)    Headache    "monthly" (09/11/2016)   History of blood transfusion    "several since age 54; I've had them after most of my surgeries" (09/11/2016)   History of myocardial infarction     - Myoview [name] 2018 indicated small Inferior - Inferoseptal Infarct (vs. rest ischemia). Echo with inferoseptal hypokinesis.   Hx of skin cancer, basal cell 2013 and 2012   right inner, lower leg; several scattered around my body   Hypertension    Iron deficiency anemia    "I've had an iron infusion"   Osteoarthritis    Osteopenia    Peptic ulcer disease    H pylori   PMR (polymyalgia rheumatica) (HCC)    Pneumonia    PONV (postoperative nausea  and vomiting)    Psoriasis    Stroke (HCC)    hx of mini stroke   TIA (transient ischemic attack) 06/03/2020   Symptoms of right arm weakness/numbness and slurred speech as well as right droopm => MRI brain/MRA head normal.  Normal echo normal carotid Dopplers.    Past Surgical History:  Procedure Laterality Date   APPENDECTOMY     BACK SURGERY     x 6    BREAST BIOPSY Left 1980 and 1982   "benign"   CARDIAC CATHETERIZATION N/A 09/11/2016   Procedure: Left Heart Cath and Coronary Angiography;  Surgeon: Marykay Lex, MD;  Location[facility] INVASIVE CV LAB;  Service: Cardiovascular: mLCx 90% * (PCI). Ostial D1 70% (not PTCA target). Mod ostial and mD2.  LAD tandem 50% stenoses w/ negative FFR (0.86)   CARDIAC CATHETERIZATION N/A 09/11/2016   Procedure: Intravascular Pressure Wire/FFR Study;  Surgeon: Marykay Lex, MD;  Location: [facility] INVASIVE CV LAB;  Service: Cardiovascular: --  LAD tandem 50% stenoses w/ negative FFR (0.86   CARDIAC CATHETERIZATION N/A 09/11/2016   Procedure: Coronary Stent Intervention;  Surgeon: Marykay Lex, MD;  Location: [facility] INVASIVE CV LAB;  Service: Cardiovascular: -- DES PCI mLCx 90%--0% SYNERGY DES 2.75 MM X 16 MM.   CARDIAC CATHETERIZATION  2001   non-obstructive CAD   CATARACT EXTRACTION W/ INTRAOCULAR LENS  IMPLANT, BILATERAL  July -  August 2017   DILATION AND CURETTAGE OF UTERUS  X 3   ESOPHAGOGASTRODUODENOSCOPY  03/2009   ulcer,HP, GERD stricture   EXCISIONAL HEMORRHOIDECTOMY     HIP ARTHROPLASTY Left 2010   JOINT REPLACEMENT     KNEE ARTHROSCOPY Bilateral    LAPAROSCOPIC CHOLECYSTECTOMY  2005   LUMBAR DISC SURGERY  11/1984; 1987; 1989; 2004; 2005 X 2   deg. disk lumber spine    MOHS SURGERY Right    "inside my lower leg"   NASAL SINUS SURGERY  1970s   NM MYOVIEW LTD  08/28/2016   INTERMEDIATE RISK (b/c reduced EF & ? small infarct) -- EF ~35-40%.  ? Prior Small Inferior-apical & septal infarct (w/o ischemia).  Difuse HK - worse in inferoapex.   Suggest prior infarct with no ischemia. Ischemic area correlates with echo WMA   NM MYOVIEW LTD  05/27/2021   Findings consistent with prior anteroapical infarct with fixed defect.  No peri-infarct ischemia.  Notable improvement from prior study.:   ORIF FEMUR FRACTURE Right 01/04/2020   Procedure: OPEN REDUCTION INTERNAL FIXATION (ORIF) DISTAL FEMUR FRACTURE;  Surgeon: Samson Frederic, MD;  Location: [facility] OR;  Service: Orthopedics;  Laterality: Right;   REVERSE SHOULDER ARTHROPLASTY Left 12/17/2017   Procedure: LEFT REVERSE SHOULDER ARTHROPLASTY;  Surgeon: Beverely Low, MD;  Location: [facility] OR;  Service: Orthopedics;  Laterality: Left;   TOTAL HIP ARTHROPLASTY Right 04/04/2013   Procedure: RIGHT TOTAL HIP ARTHROPLASTY ANTERIOR APPROACH;  Surgeon: Shelda Pal, MD;  Location: [facility] ORS;  Service: Orthopedics;  Laterality: Right;   TOTAL KNEE ARTHROPLASTY Right 2009   TRANSTHORACIC ECHOCARDIOGRAM  1/'18; 1/'19   a) 1/'18: mild conc LVH. EF 45-50% - anteroseptal & inferoseptal HK. Mod AI.; b) 1/'19 - post PCI: (post PCI) --> EF normalized:  Normal LV systolic function; mild diastolic dysfunction; mild   LVH; sclerotic aortic valve with mild AI.  No RWMA   TRANSTHORACIC ECHOCARDIOGRAM  06/04/2020    for TIA: Normal LV size and function.  No or WMA.  EF 60 to 65%.  GR 1 DD with elevated LAP.  Normal RV size and function.  Normal RVP, RAP.  Mild aortic valve calcification-sclerosis but no stenosis.  Trivial AI.  No embolic source   TUBAL LIGATION     VAGINAL HYSTERECTOMY  1970s   partial     MEDICATIONS:  acetaminophen (TYLENOL) 500 MG tablet   atorvastatin (LIPITOR) 40 MG tablet   benzonatate (TESSALON) 200 MG capsule   busPIRone (BUSPAR) 15 MG tablet   cetirizine (ZYRTEC) 10 MG tablet   Cholecalciferol (VITAMIN D) 50 MCG (2000 UT) tablet   clopidogrel (PLAVIX) 75 MG tablet   fluticasone (FLONASE) 50 MCG/ACT nasal spray   HYDROcodone-acetaminophen (NORCO) 7.5-325 MG tablet   losartan (COZAAR) 100  MG tablet   Melatonin 3 MG CAPS   methocarbamol (ROBAXIN) 500 MG tablet   metoprolol succinate (TOPROL XL) 25 MG 24 hr tablet   nitroGLYCERIN (NITROSTAT) 0.4 MG SL tablet   nystatin (MYCOSTATIN/NYSTOP) powder   pantoprazole (PROTONIX) 40 MG tablet   sertraline (ZOLOFT) 25 MG tablet   No current facility-administered medications for this encounter.   Marcille Blanco [facility] Surgical Short Stay/Anesthesiology [facility] Phone [phone] 10/18/2023 2:28 PM

## 2023-10-18 NOTE — Anesthesia Preprocedure Evaluation (Signed)
 Anesthesia Evaluation  Patient identified by MRN, date of birth, ID band Patient awake    Reviewed: Allergy & Precautions, NPO status , Patient's Chart, lab work & pertinent test results  History of Anesthesia Complications (+) PONV and history of anesthetic complications  Airway Mallampati: II  TM Distance: >3 FB Neck ROM: Full    Dental  (+) Dental Advisory Given, Partial Upper   Pulmonary neg pulmonary ROS   Pulmonary exam normal breath sounds clear to auscultation       Cardiovascular hypertension, (-) angina + CAD and + Cardiac Stents  Normal cardiovascular exam Rhythm:Regular Rate:Normal     Neuro/Psych  Headaches PSYCHIATRIC DISORDERS Anxiety Depression    TIA   GI/Hepatic Neg liver ROS, PUD,GERD  ,,  Endo/Other  negative endocrine ROS    Renal/GU Renal InsufficiencyRenal disease     Musculoskeletal  (+) Arthritis ,    Abdominal   Peds  Hematology  (+) Blood dyscrasia, anemia   Anesthesia Other Findings Day of surgery medications reviewed with the patient.  Reproductive/Obstetrics                             Anesthesia Physical Anesthesia Plan  ASA: 3  Anesthesia Plan: General   Post-op Pain Management: Tylenol PO (pre-op)* and Regional block*   Induction: Intravenous  PONV Risk Score and Plan: 4 or greater and Dexamethasone and Ondansetron  Airway Management Planned: Oral ETT  Additional Equipment:   Intra-op Plan:   Post-operative Plan: Extubation in OR  Informed Consent: I have reviewed the patients History and Physical, chart, labs and discussed the procedure including the risks, benefits and alternatives for the proposed anesthesia with the patient or authorized representative who has indicated his/her understanding and acceptance.     Dental advisory given  Plan Discussed with: CRNA  Anesthesia Plan Comments: (See PAT note from 2/21 by Sherlie Ban PA-C )         Anesthesia Quick Evaluation

## 2023-10-22 ENCOUNTER — Observation Stay (HOSPITAL_COMMUNITY)
Admission: RE | Admit: 2023-10-22 | Discharge: 2023-10-23 | Disposition: A | Discharge status: Home / Self Care | Payer: Medicare Other | Attending: Orthopedic Surgery | Admitting: Orthopedic Surgery

## 2023-10-22 ENCOUNTER — Ambulatory Visit (HOSPITAL_COMMUNITY): Payer: Medicare Other | Admitting: Medical

## 2023-10-22 ENCOUNTER — Encounter (HOSPITAL_COMMUNITY)
Admission: RE | Disposition: A | Discharge status: Home / Self Care | Payer: Self-pay | Source: Home / Self Care | Attending: Orthopedic Surgery

## 2023-10-22 ENCOUNTER — Observation Stay (HOSPITAL_COMMUNITY): Payer: Medicare Other

## 2023-10-22 ENCOUNTER — Other Ambulatory Visit: Payer: Self-pay

## 2023-10-22 ENCOUNTER — Ambulatory Visit (HOSPITAL_COMMUNITY): Payer: Medicare Other | Admitting: Anesthesiology

## 2023-10-22 ENCOUNTER — Encounter (HOSPITAL_COMMUNITY): Payer: Self-pay | Admitting: Orthopedic Surgery

## 2023-10-22 DIAGNOSIS — Z96643 Presence of artificial hip joint, bilateral: Secondary | ICD-10-CM | POA: Insufficient documentation

## 2023-10-22 DIAGNOSIS — Z8673 Personal history of transient ischemic attack (TIA), and cerebral infarction without residual deficits: Secondary | ICD-10-CM | POA: Diagnosis not present

## 2023-10-22 DIAGNOSIS — I10 Essential (primary) hypertension: Secondary | ICD-10-CM | POA: Diagnosis not present

## 2023-10-22 DIAGNOSIS — Z96611 Presence of right artificial shoulder joint: Secondary | ICD-10-CM | POA: Diagnosis not present

## 2023-10-22 DIAGNOSIS — Z85020 Personal history of malignant carcinoid tumor of stomach: Secondary | ICD-10-CM | POA: Diagnosis not present

## 2023-10-22 DIAGNOSIS — G8918 Other acute postprocedural pain: Secondary | ICD-10-CM | POA: Diagnosis not present

## 2023-10-22 DIAGNOSIS — Z96651 Presence of right artificial knee joint: Secondary | ICD-10-CM | POA: Diagnosis not present

## 2023-10-22 DIAGNOSIS — Z471 Aftercare following joint replacement surgery: Secondary | ICD-10-CM | POA: Diagnosis not present

## 2023-10-22 DIAGNOSIS — M12811 Other specific arthropathies, not elsewhere classified, right shoulder: Secondary | ICD-10-CM | POA: Diagnosis not present

## 2023-10-22 DIAGNOSIS — F418 Other specified anxiety disorders: Secondary | ICD-10-CM

## 2023-10-22 DIAGNOSIS — M19011 Primary osteoarthritis, right shoulder: Secondary | ICD-10-CM | POA: Insufficient documentation

## 2023-10-22 DIAGNOSIS — I2510 Atherosclerotic heart disease of native coronary artery without angina pectoris: Secondary | ICD-10-CM | POA: Insufficient documentation

## 2023-10-22 DIAGNOSIS — Z96612 Presence of left artificial shoulder joint: Secondary | ICD-10-CM | POA: Diagnosis not present

## 2023-10-22 DIAGNOSIS — Z7902 Long term (current) use of antithrombotics/antiplatelets: Secondary | ICD-10-CM | POA: Insufficient documentation

## 2023-10-22 DIAGNOSIS — M75111 Incomplete rotator cuff tear or rupture of right shoulder, not specified as traumatic: Secondary | ICD-10-CM | POA: Diagnosis not present

## 2023-10-22 DIAGNOSIS — Z79899 Other long term (current) drug therapy: Secondary | ICD-10-CM | POA: Insufficient documentation

## 2023-10-22 SURGERY — ARTHROPLASTY, SHOULDER, TOTAL, REVERSE
Anesthesia: General | Site: Shoulder | Laterality: Right

## 2023-10-22 MED ORDER — FENTANYL CITRATE (PF) 100 MCG/2ML IJ SOLN
INTRAMUSCULAR | Status: DC | PRN
  Administered 2023-10-22 (×2): 50 ug via INTRAVENOUS

## 2023-10-22 MED ORDER — LIDOCAINE HCL (PF) 2 % IJ SOLN
INTRAMUSCULAR | Status: AC
  Filled 2023-10-22: qty 5

## 2023-10-22 MED ORDER — PHENOL 1.4 % MT LIQD: 1.0000 | OROMUCOSAL | Status: DC | PRN

## 2023-10-22 MED ORDER — LOSARTAN POTASSIUM 50 MG PO TABS
100.0000 mg | ORAL_TABLET | Freq: Every day | ORAL | Status: DC
  Administered 2023-10-22: 100 mg via ORAL
  Filled 2023-10-22 (×2): qty 2

## 2023-10-22 MED ORDER — BUPIVACAINE-EPINEPHRINE (PF) 0.25% -1:200000 IJ SOLN
INTRAMUSCULAR | Status: DC | PRN
  Administered 2023-10-22: 14 mL

## 2023-10-22 MED ORDER — LACTATED RINGERS IV SOLN: INTRAVENOUS | Status: DC

## 2023-10-22 MED ORDER — CHLORHEXIDINE GLUCONATE 4 % EX SOLN
1.0000 | CUTANEOUS | 1 refills | Status: AC
Start: 2023-10-22 — End: ?

## 2023-10-22 MED ORDER — MUPIROCIN 2 % EX OINT
1.0000 | TOPICAL_OINTMENT | Freq: Two times a day (BID) | CUTANEOUS | 0 refills | Status: AC
Start: 2023-10-22 — End: 2023-11-21

## 2023-10-22 MED ORDER — PHENYLEPHRINE 80 MCG/ML (10ML) SYRINGE FOR IV PUSH (FOR BLOOD PRESSURE SUPPORT)
PREFILLED_SYRINGE | INTRAVENOUS | Status: AC
  Filled 2023-10-22: qty 10

## 2023-10-22 MED ORDER — DOCUSATE SODIUM 100 MG PO CAPS
100.0000 mg | ORAL_CAPSULE | Freq: Two times a day (BID) | ORAL | Status: DC
  Administered 2023-10-22: 100 mg via ORAL
  Filled 2023-10-22 (×2): qty 1

## 2023-10-22 MED ORDER — SODIUM CHLORIDE 0.9 % IV SOLN: INTRAVENOUS | Status: DC

## 2023-10-22 MED ORDER — STERILE WATER FOR IRRIGATION IR SOLN
Status: DC | PRN
  Administered 2023-10-22: 1000 mL

## 2023-10-22 MED ORDER — POLYETHYLENE GLYCOL 3350 17 G PO PACK: 17.0000 g | PACK | Freq: Every day | ORAL | Status: DC | PRN

## 2023-10-22 MED ORDER — SUCCINYLCHOLINE CHLORIDE 200 MG/10ML IV SOSY
PREFILLED_SYRINGE | INTRAVENOUS | Status: AC
  Filled 2023-10-22: qty 10

## 2023-10-22 MED ORDER — ONDANSETRON HCL 4 MG PO TABS: 4.0000 mg | ORAL_TABLET | Freq: Four times a day (QID) | ORAL | Status: DC | PRN

## 2023-10-22 MED ORDER — PHENYLEPHRINE HCL-NACL 20-0.9 MG/250ML-% IV SOLN: INTRAVENOUS | Status: DC | PRN

## 2023-10-22 MED ORDER — METOPROLOL SUCCINATE ER 25 MG PO TB24: 25.0000 mg | ORAL_TABLET | Freq: Every evening | ORAL | Status: DC

## 2023-10-22 MED ORDER — NYSTATIN 100000 UNIT/GM EX POWD: Freq: Two times a day (BID) | CUTANEOUS | Status: DC | PRN

## 2023-10-22 MED ORDER — CEFAZOLIN SODIUM-DEXTROSE 2-4 GM/100ML-% IV SOLN
2.0000 g | INTRAVENOUS | Status: AC
  Administered 2023-10-22: 2 g via INTRAVENOUS
  Filled 2023-10-22: qty 100

## 2023-10-22 MED ORDER — MENTHOL 3 MG MT LOZG: 1.0000 | LOZENGE | OROMUCOSAL | Status: DC | PRN

## 2023-10-22 MED ORDER — TRANEXAMIC ACID-NACL 1000-0.7 MG/100ML-% IV SOLN
1000.0000 mg | INTRAVENOUS | Status: DC
  Filled 2023-10-22: qty 100

## 2023-10-22 MED ORDER — DEXAMETHASONE SODIUM PHOSPHATE 10 MG/ML IJ SOLN
INTRAMUSCULAR | Status: AC
  Filled 2023-10-22: qty 1

## 2023-10-22 MED ORDER — METOCLOPRAMIDE HCL 5 MG/ML IJ SOLN: 5.0000 mg | Freq: Three times a day (TID) | INTRAMUSCULAR | Status: DC | PRN

## 2023-10-22 MED ORDER — BUSPIRONE HCL 5 MG PO TABS
15.0000 mg | ORAL_TABLET | Freq: Every day | ORAL | Status: DC
  Administered 2023-10-23: 15 mg via ORAL
  Filled 2023-10-22: qty 1

## 2023-10-22 MED ORDER — ACETAMINOPHEN 10 MG/ML IV SOLN
INTRAVENOUS | Status: AC
Start: 2023-10-22 — End: 2023-10-23
  Filled 2023-10-22: qty 100

## 2023-10-22 MED ORDER — FENTANYL CITRATE PF 50 MCG/ML IJ SOSY: 25.0000 ug | PREFILLED_SYRINGE | INTRAMUSCULAR | Status: DC | PRN

## 2023-10-22 MED ORDER — CHLORHEXIDINE GLUCONATE 0.12 % MT SOLN
15.0000 mL | Freq: Once | OROMUCOSAL | Status: AC
  Administered 2023-10-22: 15 mL via OROMUCOSAL

## 2023-10-22 MED ORDER — VITAMIN D 50 MCG (2000 UT) PO TABS
2000.0000 [IU] | ORAL_TABLET | Freq: Two times a day (BID) | ORAL | Status: DC
Start: 2023-10-22 — End: 2023-10-22

## 2023-10-22 MED ORDER — PROPOFOL 10 MG/ML IV BOLUS
INTRAVENOUS | Status: AC
  Filled 2023-10-22: qty 20

## 2023-10-22 MED ORDER — MELATONIN 3 MG PO TABS
6.0000 mg | ORAL_TABLET | Freq: Every day | ORAL | Status: DC
Start: 2023-10-22 — End: 2023-10-23
  Administered 2023-10-22: 6 mg via ORAL
  Filled 2023-10-22: qty 2

## 2023-10-22 MED ORDER — PROPOFOL 10 MG/ML IV BOLUS
INTRAVENOUS | Status: DC | PRN
  Administered 2023-10-22 (×2): 50 mg via INTRAVENOUS
  Administered 2023-10-22: 100 mg via INTRAVENOUS

## 2023-10-22 MED ORDER — TRANEXAMIC ACID-NACL 1000-0.7 MG/100ML-% IV SOLN
INTRAVENOUS | Status: DC | PRN
Start: 2023-10-22 — End: 2023-10-22
  Administered 2023-10-22: 1000 mg via INTRAVENOUS

## 2023-10-22 MED ORDER — METOCLOPRAMIDE HCL 5 MG PO TABS: 5.0000 mg | ORAL_TABLET | Freq: Three times a day (TID) | ORAL | Status: DC | PRN

## 2023-10-22 MED ORDER — LABETALOL HCL 5 MG/ML IV SOLN
5.0000 mg | INTRAVENOUS | Status: DC | PRN
Start: 2023-10-22 — End: 2023-10-22
  Administered 2023-10-22: 5 mg via INTRAVENOUS
  Filled 2023-10-22: qty 4

## 2023-10-22 MED ORDER — LACTATED RINGERS IV SOLN: INTRAVENOUS | Status: DC | PRN

## 2023-10-22 MED ORDER — LORATADINE 10 MG PO TABS
10.0000 mg | ORAL_TABLET | Freq: Every day | ORAL | Status: DC
  Administered 2023-10-23: 10 mg via ORAL
  Filled 2023-10-22: qty 1

## 2023-10-22 MED ORDER — LIDOCAINE HCL (CARDIAC) PF 100 MG/5ML IV SOSY
PREFILLED_SYRINGE | INTRAVENOUS | Status: DC | PRN
  Administered 2023-10-22: 100 mg via INTRATRACHEAL

## 2023-10-22 MED ORDER — ORAL CARE MOUTH RINSE: 15.0000 mL | Freq: Once | OROMUCOSAL | Status: AC

## 2023-10-22 MED ORDER — VITAMIN D 25 MCG (1000 UNIT) PO TABS
2000.0000 [IU] | ORAL_TABLET | Freq: Two times a day (BID) | ORAL | Status: DC
  Administered 2023-10-22 – 2023-10-23 (×2): 2000 [IU] via ORAL
  Filled 2023-10-22 (×2): qty 2

## 2023-10-22 MED ORDER — SERTRALINE HCL 25 MG PO TABS
25.0000 mg | ORAL_TABLET | Freq: Every day | ORAL | Status: DC
  Administered 2023-10-23: 25 mg via ORAL
  Filled 2023-10-22: qty 1

## 2023-10-22 MED ORDER — SUCCINYLCHOLINE CHLORIDE 200 MG/10ML IV SOSY
PREFILLED_SYRINGE | INTRAVENOUS | Status: DC | PRN
  Administered 2023-10-22: 100 mg via INTRAVENOUS

## 2023-10-22 MED ORDER — ATORVASTATIN CALCIUM 40 MG PO TABS
40.0000 mg | ORAL_TABLET | Freq: Every day | ORAL | Status: DC
  Administered 2023-10-22: 40 mg via ORAL
  Filled 2023-10-22 (×2): qty 1

## 2023-10-22 MED ORDER — LOSARTAN POTASSIUM 50 MG PO TABS: 100.0000 mg | ORAL_TABLET | Freq: Every day | ORAL | Status: DC

## 2023-10-22 MED ORDER — ONDANSETRON HCL 4 MG/2ML IJ SOLN
INTRAMUSCULAR | Status: DC | PRN
  Administered 2023-10-22: 4 mg via INTRAVENOUS

## 2023-10-22 MED ORDER — PHENYLEPHRINE HCL-NACL 20-0.9 MG/250ML-% IV SOLN
INTRAVENOUS | Status: DC | PRN
  Administered 2023-10-22: 20 ug/min via INTRAVENOUS

## 2023-10-22 MED ORDER — MORPHINE SULFATE (PF) 2 MG/ML IV SOLN: 0.5000 mg | INTRAVENOUS | Status: DC | PRN

## 2023-10-22 MED ORDER — ONDANSETRON HCL 4 MG/2ML IJ SOLN: 4.0000 mg | Freq: Four times a day (QID) | INTRAMUSCULAR | Status: DC | PRN

## 2023-10-22 MED ORDER — TRANEXAMIC ACID-NACL 1000-0.7 MG/100ML-% IV SOLN
1000.0000 mg | Freq: Once | INTRAVENOUS | Status: AC
  Administered 2023-10-22: 1000 mg via INTRAVENOUS
  Filled 2023-10-22: qty 100

## 2023-10-22 MED ORDER — CLOPIDOGREL BISULFATE 75 MG PO TABS
75.0000 mg | ORAL_TABLET | Freq: Every day | ORAL | Status: DC
  Administered 2023-10-22 – 2023-10-23 (×2): 75 mg via ORAL
  Filled 2023-10-22 (×2): qty 1

## 2023-10-22 MED ORDER — 0.9 % SODIUM CHLORIDE (POUR BTL) OPTIME
TOPICAL | Status: DC | PRN
  Administered 2023-10-22: 1000 mL

## 2023-10-22 MED ORDER — FENTANYL CITRATE (PF) 100 MCG/2ML IJ SOLN
INTRAMUSCULAR | Status: AC
  Filled 2023-10-22: qty 2

## 2023-10-22 MED ORDER — BENZONATATE 100 MG PO CAPS: 200.0000 mg | ORAL_CAPSULE | Freq: Three times a day (TID) | ORAL | Status: DC | PRN

## 2023-10-22 MED ORDER — METHOCARBAMOL 500 MG PO TABS: 500.0000 mg | ORAL_TABLET | Freq: Three times a day (TID) | ORAL | Status: DC | PRN

## 2023-10-22 MED ORDER — HYDROCODONE-ACETAMINOPHEN 7.5-325 MG PO TABS
1.0000 | ORAL_TABLET | Freq: Three times a day (TID) | ORAL | Status: DC | PRN
  Administered 2023-10-23 (×2): 1 via ORAL
  Filled 2023-10-22 (×2): qty 1

## 2023-10-22 MED ORDER — NITROGLYCERIN 0.4 MG SL SUBL: 0.4000 mg | SUBLINGUAL_TABLET | SUBLINGUAL | Status: DC | PRN

## 2023-10-22 MED ORDER — FLUTICASONE PROPIONATE 50 MCG/ACT NA SUSP: 1.0000 | Freq: Every day | NASAL | Status: DC | PRN

## 2023-10-22 MED ORDER — ONDANSETRON HCL 4 MG/2ML IJ SOLN: 4.0000 mg | Freq: Once | INTRAMUSCULAR | Status: DC | PRN

## 2023-10-22 MED ORDER — FENTANYL CITRATE PF 50 MCG/ML IJ SOSY
50.0000 ug | PREFILLED_SYRINGE | INTRAMUSCULAR | Status: DC
  Administered 2023-10-22: 50 ug via INTRAVENOUS
  Filled 2023-10-22: qty 2

## 2023-10-22 MED ORDER — ACETAMINOPHEN 10 MG/ML IV SOLN
1000.0000 mg | Freq: Once | INTRAVENOUS | Status: AC
  Administered 2023-10-22: 1000 mg via INTRAVENOUS

## 2023-10-22 MED ORDER — BUPIVACAINE-EPINEPHRINE (PF) 0.25% -1:200000 IJ SOLN
INTRAMUSCULAR | Status: AC
  Filled 2023-10-22: qty 30

## 2023-10-22 MED ORDER — ACETAMINOPHEN 500 MG PO TABS
1000.0000 mg | ORAL_TABLET | Freq: Once | ORAL | Status: AC
  Administered 2023-10-22: 1000 mg via ORAL
  Filled 2023-10-22: qty 2

## 2023-10-22 MED ORDER — BUPIVACAINE LIPOSOME 1.3 % IJ SUSP
INTRAMUSCULAR | Status: DC | PRN
Start: 2023-10-22 — End: 2023-10-22
  Administered 2023-10-22: 10 mL via PERINEURAL

## 2023-10-22 MED ORDER — BUPIVACAINE HCL (PF) 0.5 % IJ SOLN
INTRAMUSCULAR | Status: DC | PRN
  Administered 2023-10-22: 10 mL via PERINEURAL

## 2023-10-22 MED ORDER — ONDANSETRON HCL 4 MG/2ML IJ SOLN
INTRAMUSCULAR | Status: AC
  Filled 2023-10-22: qty 2

## 2023-10-22 MED ORDER — PANTOPRAZOLE SODIUM 40 MG PO TBEC
40.0000 mg | DELAYED_RELEASE_TABLET | Freq: Every day | ORAL | Status: DC
  Administered 2023-10-23: 40 mg via ORAL
  Filled 2023-10-22: qty 1

## 2023-10-22 MED ORDER — ACETAMINOPHEN 500 MG PO TABS: 1000.0000 mg | ORAL_TABLET | Freq: Two times a day (BID) | ORAL | Status: DC | PRN

## 2023-10-22 MED ORDER — PHENYLEPHRINE HCL-NACL 20-0.9 MG/250ML-% IV SOLN
INTRAVENOUS | Status: AC
  Filled 2023-10-22: qty 250

## 2023-10-22 MED ORDER — CEFAZOLIN SODIUM-DEXTROSE 2-4 GM/100ML-% IV SOLN
2.0000 g | Freq: Four times a day (QID) | INTRAVENOUS | Status: AC
  Administered 2023-10-22 – 2023-10-23 (×2): 2 g via INTRAVENOUS
  Filled 2023-10-22 (×2): qty 100

## 2023-10-22 SURGICAL SUPPLY — 62 items
BAG COUNTER SPONGE SURGICOUNT (BAG) IMPLANT
BAG ZIPLOCK 12X15 (MISCELLANEOUS) IMPLANT
BIT DRILL 1.6MX128 (BIT) IMPLANT
BIT DRILL 170X2.5X (BIT) IMPLANT
BIT DRL 170X2.5X (BIT) ×1 IMPLANT
BLADE SAG 18X100X1.27 (BLADE) ×1 IMPLANT
COVER BACK TABLE 60X90IN (DRAPES) ×1 IMPLANT
COVER SURGICAL LIGHT HANDLE (MISCELLANEOUS) ×1 IMPLANT
DRAPE INCISE IOBAN 66X45 STRL (DRAPES) ×1 IMPLANT
DRAPE SHEET LG 3/4 BI-LAMINATE (DRAPES) ×1 IMPLANT
DRAPE SURG ORHT 6 SPLT 77X108 (DRAPES) ×2 IMPLANT
DRAPE TOP 10253 STERILE (DRAPES) ×1 IMPLANT
DRAPE U-SHAPE 47X51 STRL (DRAPES) ×1 IMPLANT
DRSG ADAPTIC 3X8 NADH LF (GAUZE/BANDAGES/DRESSINGS) ×1 IMPLANT
DURAPREP 26ML APPLICATOR (WOUND CARE) ×1 IMPLANT
ECCENTRIC EPIPHYSI MODULAR SZ1 (Trauma) IMPLANT
ELECT BLADE TIP CTD 4 INCH (ELECTRODE) ×1 IMPLANT
ELECT NDL TIP 2.8 STRL (NEEDLE) ×1 IMPLANT
ELECT NEEDLE TIP 2.8 STRL (NEEDLE) ×1 IMPLANT
ELECT REM PT RETURN 15FT ADLT (MISCELLANEOUS) ×1 IMPLANT
FACESHIELD WRAPAROUND (MASK) ×1 IMPLANT
FACESHIELD WRAPAROUND OR TEAM (MASK) ×1 IMPLANT
GAUZE PAD ABD 8X10 STRL (GAUZE/BANDAGES/DRESSINGS) ×1 IMPLANT
GAUZE SPONGE 4X4 12PLY STRL (GAUZE/BANDAGES/DRESSINGS) ×1 IMPLANT
GLENOSPHERE DELTA XTEND LAT 38 (Miscellaneous) IMPLANT
GLOVE BIOGEL PI IND STRL 7.5 (GLOVE) ×1 IMPLANT
GLOVE BIOGEL PI IND STRL 8.5 (GLOVE) ×1 IMPLANT
GLOVE ORTHO TXT STRL SZ7.5 (GLOVE) ×1 IMPLANT
GLOVE SURG ORTHO 8.5 STRL (GLOVE) ×1 IMPLANT
GOWN STRL REUS W/ TWL XL LVL3 (GOWN DISPOSABLE) ×2 IMPLANT
KIT BASIN OR (CUSTOM PROCEDURE TRAY) ×1 IMPLANT
KIT TURNOVER KIT A (KITS) IMPLANT
MANIFOLD NEPTUNE II (INSTRUMENTS) ×1 IMPLANT
METAGLENE DELTA EXTEND (Trauma) IMPLANT
METAGLENE DXTEND (Trauma) ×1 IMPLANT
MODULAR ECCENTRIC EPIPHYSI SZ1 (Trauma) ×1 IMPLANT
NDL MAYO CATGUT SZ4 TPR NDL (NEEDLE) IMPLANT
NEEDLE MAYO CATGUT SZ4 (NEEDLE) IMPLANT
NS IRRIG 1000ML POUR BTL (IV SOLUTION) ×1 IMPLANT
PACK SHOULDER (CUSTOM PROCEDURE TRAY) ×1 IMPLANT
PIN GUIDE 1.2 (PIN) IMPLANT
PIN GUIDE GLENOPHERE 1.5MX300M (PIN) IMPLANT
PIN METAGLENE 2.5 (PIN) IMPLANT
PIN STEINMAN FIXATION KNEE (PIN) IMPLANT
RESTRAINT HEAD UNIVERSAL NS (MISCELLANEOUS) ×1 IMPLANT
SCREW LOCK 42 (Screw) IMPLANT
SCREW LOCK DELTA XTEND 4.5X30 (Screw) IMPLANT
SLING ARM FOAM STRAP LRG (SOFTGOODS) IMPLANT
SPACER 38 PLUS 3 (Spacer) IMPLANT
SPIKE FLUID TRANSFER (MISCELLANEOUS) ×1 IMPLANT
SPONGE T-LAP 4X18 ~~LOC~~+RFID (SPONGE) IMPLANT
STEM HUMERAL SZ8 STANDARD (Stem) ×1 IMPLANT
STEM HUMERAL SZ8 STD (Stem) IMPLANT
STRIP CLOSURE SKIN 1/2X4 (GAUZE/BANDAGES/DRESSINGS) ×1 IMPLANT
SUT FIBERWIRE #2 38 T-5 BLUE (SUTURE) ×1 IMPLANT
SUT MNCRL AB 4-0 PS2 18 (SUTURE) ×1 IMPLANT
SUT VIC AB 0 CT1 36 (SUTURE) ×1 IMPLANT
SUT VIC AB 0 CT2 27 (SUTURE) ×1 IMPLANT
SUT VIC AB 2-0 CT1 TAPERPNT 27 (SUTURE) ×1 IMPLANT
SUTURE FIBERWR #2 38 T-5 BLUE (SUTURE) ×1 IMPLANT
TOWEL GREEN STERILE FF (TOWEL DISPOSABLE) ×1 IMPLANT
TOWEL OR 17X26 10 PK STRL BLUE (TOWEL DISPOSABLE) ×1 IMPLANT

## 2023-10-22 NOTE — Discharge Instructions (Signed)
 Ice to the shoulder constantly.  Keep the incision covered and clean and dry for one week, then ok to get it wet in the shower. Ok to leave the incision uncovered after one week and open to air.   Do exercise as instructed several times per day.  DO NOT reach behind your back or push up out of a chair with the operative arm.  Use a sling while you are up and around for comfort, may remove while seated.  Keep pillow propped behind the operative elbow.  Follow up with Dr Ranell Patrick in two weeks in the office, call [phone] for appt  Please call Dr Ranell Patrick (cell) [phone] with any questions or concerns

## 2023-10-22 NOTE — Anesthesia Postprocedure Evaluation (Signed)
 Anesthesia Post Note  Patient: DORRAINE ELLENDER  Procedure(s) Performed: REVERSE SHOULDER ARTHROPLASTY (Right: Shoulder)     Patient location during evaluation: PACU Anesthesia Type: General and Regional Level of consciousness: awake and alert, oriented and patient cooperative Pain management: pain level controlled Vital Signs Assessment: post-procedure vital signs reviewed and stable Respiratory status: spontaneous breathing, nonlabored ventilation and respiratory function stable Cardiovascular status: blood pressure returned to baseline and stable Postop Assessment: no apparent nausea or vomiting Anesthetic complications: no Comments: Hypertensive at baseline, no pain   No notable events documented.  Last Vitals:  Vitals:   10/22/23 1715 10/22/23 1730  BP: (!) 195/89 (!) 199/87  Pulse: 79 75  Resp: 16 18  Temp:    SpO2: 99% 100%    Last Pain:  Vitals:   10/22/23 1645  TempSrc:   PainSc: 0-No pain                 Lannie Fields

## 2023-10-22 NOTE — Op Note (Signed)
 NAMEEVANGELYNN, LOCHRIDGE MEDICAL RECORD NO: [id number] ACCOUNT NO: [id number] DATE OF BIRTH: 03-08-1941 FACILITY: [facility]ATION[id number] PHYSICIAN: Almedia Balls. Ranell Patrick, MD  Operative Report   DATE OF PROCEDURE: 10/22/2023  PREOPERATIVE DIAGNOSIS:  Right shoulder rotator cuff tear arthropathy.  POSTOPERATIVE DIAGNOSIS:  Right shoulder rotator cuff tear arthropathy.  PROCEDURE PERFORMED:  Right reverse total shoulder arthroplasty using DePuy Delta Xtend prosthesis.  ATTENDING SURGEON:  Almedia Balls. Ranell Patrick, MD.  ASSISTANT:  Konrad Felix Dixon, [place], who was scrubbed during the entire procedure, and necessary for satisfactory completion of surgery.  ANESTHESIA:  General anesthesia was used plus interscalene block.  ESTIMATED BLOOD LOSS:  Less than 100 mL.  FLUID REPLACEMENT:  1000 mL crystalloid.  COUNTS:  Instrument count was correct.  COMPLICATIONS:  There were no complications.  ANTIBIOTICS:  Perioperative antibiotics were given.  INDICATIONS:  The patient is an 83 year old female with worsening right shoulder pain due to end-stage arthritis and rotator cuff insufficiency.  The patient has failed an extended period of conservative management and desires operative treatment to  eliminate pain and restore function. Informed consent obtained.  DESCRIPTION OF PROCEDURE:  After an adequate level of anesthesia was achieved, the patient was positioned in the modified beach chair position.  Right shoulder correctly identified and sterile prep and drape performed.  Timeout called verifying correct  patient, correct site.  We entered the patient's shoulder using a standard deltopectoral incision starting at the coracoid process and extending down to the anterior humerus with a 10 blade scalpel.  Subcutaneous dissection with the Bovie, we identified  the cephalic vein and took that laterally to the deltoid. Pectoralis was retracted medially.  Conjoined tendon identified and retracted medially.  Deep  retractors were placed.  We tenodesed the biceps in situ with 0 Vicryl figure-of-eight suture x 2. We  then released the subscapularis subperiosteally off lesser tuberosity, which was in surprisingly good shape, but we were choosing not to repair given the patient's age and the desire to let her go ahead and push and use the arm for balance immediately.   We did use the subscapular retraction.  We released the inferior capsule. We progressively externally rotated extending the shoulder and delivering the humeral head out of the wound.  There was no cartilage remaining on the humeral head.  We entered the  proximal humerus with a 6-mm reamer reaming up to a size 8.  We then placed our 8-mm T-handle intramedullary guide, and resected the head at 20 degrees of retroversion with the oscillating saw.  We removed excess osteophytes with a rongeur.  We then  subluxed the humerus posteriorly, gaining good exposure of the glenoid face.  We placed our deep retractors.  We removed the biceps stump and the labrum and released the capsule. We then went ahead and placed our guidepin centered low on the glenoid,  angled slightly inferiorly bicortically.  Once we had that bicortical pin in place, we reamed to subchondral bone with the reamer. We then did our peripheral hand reaming to make room for the glenosphere and then next drilled out our central peg hole.   We irrigated thoroughly.  We then impacted the HA coated press-fit baseplate into position. Due to the patient's small glenoid size, we could only get two screws.  We placed a 42 screw inferiorly and a 30 screw superiorly, both with great purchase.  We  locked those screws into the plate. We then selected a 38 +0 standard glenosphere attached to the baseplate. We  went to the humeral side and reamed for the one right metaphysis. We then trialed with the 8 stem and the 1 right metaphysis set on the 0  setting and placed in 20 degrees of retroversion. We used a 38 +3  trial, placed on the humeral tray, reduced the shoulder.  We were happy with our soft tissue balancing and stability.  We removed the trial components, irrigated thoroughly, used viable  bone graft from the humeral head and impaction grafting technique. We placed an 8 Por[place]oat stem with a 1 right HA-coated metaphysis set on the 0 setting and placed in 20 degrees of retroversion. Once that was impacted and stable, we selected the real 38  +3 poly, placed that in the humeral tray, impacted that, and reduced the shoulder.  Again, a nice little pop as it reduced, appropriate conjoint tensioning and good stability throughout a full arc of motion.  We irrigated thoroughly. We then resected the  subscapularis. Next, we repaired the deltopectoral interval with 0 Vicryl suture followed by 2-0 Vicryl for subcuticular closure and 4-0 Monocryl for skin.  Steri-Strips applied followed by a sterile dressing.  The patient tolerated surgery well.   NIK D: 10/22/2023 4:40:29 pm T: 10/22/2023 9:05:00 pm  JOB: [id number]/ [id number]

## 2023-10-22 NOTE — Anesthesia Procedure Notes (Signed)
 Anesthesia Regional Block: Interscalene brachial plexus block   Pre-Anesthetic Checklist: , timeout performed,  Correct Patient, Correct Site, Correct Laterality,  Correct Procedure, Correct Position, site marked,  Risks and benefits discussed,  Surgical consent,  Pre-op evaluation,  At surgeon's request and post-op pain management  Laterality: Right  Prep: chloraprep       Needles:  Injection technique: Single-shot  Needle Type: Echogenic Stimulator Needle     Needle Length: 9cm  Needle Gauge: 22     Additional Needles:   Procedures:,,,, ultrasound used (permanent image in chart),,    Narrative:  Start time: 10/22/2023 2:15 PM End time: 10/22/2023 2:23 PM Injection made incrementally with aspirations every 5 mL.  Performed by: Personally  Anesthesiologist: Collene Schlichter, MD  Additional Notes: Functioning IV was confirmed and monitors were applied.  A 90mm 22ga echogenic stimulator needle was used. Sterile prep and drape, hand hygiene, and sterile gloves were used.  Negative aspiration and negative test dose prior to incremental administration of local anesthetic. The patient tolerated the procedure well.  Ultrasound guidance: relevent anatomy identified, needle position confirmed, local anesthetic spread visualized around nerve(s), vascular puncture avoided.  Image printed for medical record.

## 2023-10-22 NOTE — Care Plan (Signed)
 Ortho Bundle Case Management Note  Patient Details  Name: [name] [name] MRN: [id number] Date of Birth: Oct 02, 1940                  R Rev TSA on 10/22/23. DCP: Home with daughter Darel Hong. DME: No needs. PT: HEP   DME Arranged:  N/A DME Agency:     HH Arranged:    HH Agency:     Additional Comments: Please contact me with any questions of if this plan should need to change.    Despina Pole, CCM Case Manager, [facility]  [phone] 10/22/2023, 1:50 PM

## 2023-10-22 NOTE — Transfer of Care (Addendum)
 Immediate Anesthesia Transfer of Care Note  Patient: [name] [name]  Procedure(s) Performed: REVERSE SHOULDER ARTHROPLASTY (Right: Shoulder)  Patient Location: PACU  Anesthesia Type:General and Regional  Level of Consciousness: awake, alert , and oriented  Airway & Oxygen Therapy: Patient Spontanous Breathing and Patient connected to face mask oxygen  Post-op Assessment: Report given to RN and Post -op Vital signs reviewed and stable  Post vital signs: Reviewed and stable  Last Vitals:  Vitals Value Taken Time  BP 166/98 10/22/23 1645  Temp    Pulse 81 10/22/23 1646  Resp 14 10/22/23 1646  SpO2 100 % 10/22/23 1646  Vitals shown include unfiled device data.  Last Pain:  Vitals:   10/22/23 1144  TempSrc: Oral  PainSc:          Complications: No notable events documented.

## 2023-10-22 NOTE — Interval H&P Note (Signed)
 History and Physical Interval Note:  10/22/2023 12:47 PM  [name] [name]  has presented today for surgery, with the diagnosis of Right shoulder rotator cuff arthropathy.  The various methods of treatment have been discussed with the patient and family. After consideration of risks, benefits and other options for treatment, the patient has consented to  Procedure(s) with comments: REVERSE SHOULDER ARTHROPLASTY (Right) - interscale block as a surgical intervention.  The patient's history has been reviewed, patient examined, no change in status, stable for surgery.  I have reviewed the patient's chart and labs.  Questions were answered to the patient's satisfaction.     Verlee Rossetti

## 2023-10-22 NOTE — Anesthesia Procedure Notes (Signed)
 Procedure Name: Intubation Date/Time: 10/22/2023 3:17 PM  Performed by: Micki Riley, CRNAPre-anesthesia Checklist: Patient identified, Emergency Drugs available, Suction available and Patient being monitored Patient Re-evaluated:Patient Re-evaluated prior to induction Oxygen Delivery Method: Circle System Utilized Preoxygenation: Pre-oxygenation with 100% oxygen Induction Type: IV induction Ventilation: Mask ventilation without difficulty Laryngoscope Size: Miller and 2 Grade View: Grade III Tube type: Oral Number of attempts: 1 Airway Equipment and Method: Stylet and Oral airway Placement Confirmation: ETT inserted through vocal cords under direct vision, positive ETCO2 and breath sounds checked- equal and bilateral Secured at: 21 cm Tube secured with: Tape Dental Injury: Teeth and Oropharynx as per pre-operative assessment

## 2023-10-22 NOTE — Brief Op Note (Signed)
 10/22/2023  4:35 PM  PATIENT:  [name] [name]  83 y.o. female  PRE-OPERATIVE DIAGNOSIS:  Right shoulder rotator cuff arthropathy  POST-OPERATIVE DIAGNOSIS:  Right shoulder rotator cuff arthropathy  PROCEDURE:  Procedure(s) with comments: REVERSE SHOULDER ARTHROPLASTY (Right) - interscale block Depuy Delta Xtend with NO subscap repair   SURGEON:  Surgeons and Role:    Beverely Low, MD - Primary  PHYSICIAN ASSISTANT:   ASSISTANTS: Thea Gist, PA-C   ANESTHESIA:   regional and general  EBL:  minimal  BLOOD ADMINISTERED:none  DRAINS: none   LOCAL MEDICATIONS USED:  MARCAINE     SPECIMEN:  No Specimen  DISPOSITION OF SPECIMEN:  N/A  COUNTS:  YES  TOURNIQUET:  * No tourniquets in log *  DICTATION: .Other Dictation: Dictation Number [id number]  PLAN OF CARE: Admit for overnight observation  PATIENT DISPOSITION:  PACU - hemodynamically stable.   Delay start of Pharmacological VTE agent (>24hrs) due to surgical blood loss or risk of bleeding: yes

## 2023-10-23 DIAGNOSIS — Z85020 Personal history of malignant carcinoid tumor of stomach: Secondary | ICD-10-CM | POA: Diagnosis not present

## 2023-10-23 DIAGNOSIS — Z7902 Long term (current) use of antithrombotics/antiplatelets: Secondary | ICD-10-CM | POA: Diagnosis not present

## 2023-10-23 DIAGNOSIS — I2510 Atherosclerotic heart disease of native coronary artery without angina pectoris: Secondary | ICD-10-CM | POA: Diagnosis not present

## 2023-10-23 DIAGNOSIS — Z96651 Presence of right artificial knee joint: Secondary | ICD-10-CM | POA: Diagnosis not present

## 2023-10-23 DIAGNOSIS — Z8673 Personal history of transient ischemic attack (TIA), and cerebral infarction without residual deficits: Secondary | ICD-10-CM | POA: Diagnosis not present

## 2023-10-23 DIAGNOSIS — M19011 Primary osteoarthritis, right shoulder: Secondary | ICD-10-CM | POA: Diagnosis not present

## 2023-10-23 DIAGNOSIS — Z96612 Presence of left artificial shoulder joint: Secondary | ICD-10-CM | POA: Diagnosis not present

## 2023-10-23 DIAGNOSIS — Z96643 Presence of artificial hip joint, bilateral: Secondary | ICD-10-CM | POA: Diagnosis not present

## 2023-10-23 DIAGNOSIS — I10 Essential (primary) hypertension: Secondary | ICD-10-CM | POA: Diagnosis not present

## 2023-10-23 DIAGNOSIS — Z79899 Other long term (current) drug therapy: Secondary | ICD-10-CM | POA: Diagnosis not present

## 2023-10-23 DIAGNOSIS — M12811 Other specific arthropathies, not elsewhere classified, right shoulder: Secondary | ICD-10-CM | POA: Diagnosis not present

## 2023-10-23 LAB — BASIC METABOLIC PANEL
Anion gap: 11 (ref 5–15)
BUN: 15 mg/dL (ref 8–23)
CO2: 21 mmol/L — ABNORMAL LOW (ref 22–32)
Calcium: 8.3 mg/dL — ABNORMAL LOW (ref 8.9–10.3)
Chloride: 105 mmol/L (ref 98–111)
Creatinine, Ser: 0.72 mg/dL (ref 0.44–1.00)
GFR, Estimated: >60 mL/min (ref >60)
Glucose, Bld: 162 mg/dL — ABNORMAL HIGH (ref 70–99)
Potassium: 4.1 mmol/L (ref 3.5–5.1)
Sodium: 137 mmol/L (ref 135–145)

## 2023-10-23 LAB — HEMOGLOBIN AND HEMATOCRIT, BLOOD
HCT: 31.4 % — ABNORMAL LOW (ref 36.0–46.0)
Hemoglobin: 9.3 g/dL — ABNORMAL LOW (ref 12.0–15.0)

## 2023-10-23 NOTE — Care Management Obs Status (Signed)
 MEDICARE OBSERVATION STATUS NOTIFICATION   Patient Details  Name: [name] [name] MRN: [id number] Date of Birth: [birth year]   Medicare Observation Status Notification Given:  Yes    MahabirOlegario Messier, RN 10/23/2023, 10:02 AM

## 2023-10-23 NOTE — Discharge Summary (Signed)
 In most cases prophylactic antibiotics for Dental procdeures after total joint surgery are not necessary.  Exceptions are as follows:  1. History of prior total joint infection  2. Severely immunocompromised (Organ Transplant, cancer chemotherapy, Rheumatoid biologic meds such as Humera)  3. Poorly controlled diabetes (A1C &gt; 8.0, blood glucose over 200)  If you have one of these conditions, contact your surgeon for an antibiotic prescription, prior to your dental procedure. Orthopedic Discharge Summary        Physician Discharge Summary  Patient ID: [name] [name] MRN: [id number] DOB/AGE: 1940-09-29 83 y.o.  Admit date: 10/22/2023 Discharge date: 10/23/2023   Procedures:  Procedure(s) (LRB): REVERSE SHOULDER ARTHROPLASTY (Right)  Attending Physician:  Dr. Malon Kindle  Admission Diagnoses:   Right shoulder end stage OA  Discharge Diagnoses:  same   Past Medical History:  Diagnosis Date   Anemia    of chronic disease   Anxiety    Arthritis    "psoratic arthritis; new dx" (09/11/2016)   CAD S/P percutaneous coronary angioplasty 08/2016   mLCx 90% -> 0% (2.75 mm x 16 mm) Synergy DES PCI Ostial D1 70% (not PTCA target). Moderate ostial and mD2.  LAD tandem 50% stenoses w/ negative FFR (0.86)   Cancer (HCC)    skin cancer on leg   Chronic lower back pain    Complication of anesthesia 1980 and 1982   trouble waking up after breast biopsy, many surgeries since no problem   Depression    Dyspnea    Dyspnea on effort - CHRONIC    partial relief of Sx post PCI of L Circumflex.   Fibrocystic breast    GERD (gastroesophageal reflux disease)    Headache    "monthly" (09/11/2016)   History of blood transfusion    "several since age 64; I've had them after most of my surgeries" (09/11/2016)   History of myocardial infarction     - Myoview [name] 2018 indicated small Inferior - Inferoseptal Infarct (vs. rest ischemia). Echo with inferoseptal hypokinesis.   Hx of skin  cancer, basal cell 2013 and 2012   right inner, lower leg; several scattered around my body   Hypertension    Iron deficiency anemia    "I've had an iron infusion"   Osteoarthritis    Osteopenia    Peptic ulcer disease    H pylori   PMR (polymyalgia rheumatica) (HCC)    Pneumonia    PONV (postoperative nausea and vomiting)    Psoriasis    Stroke (HCC)    hx of mini stroke   TIA (transient ischemic attack) 06/03/2020   Symptoms of right arm weakness/numbness and slurred speech as well as right droopm => MRI brain/MRA head normal.  Normal echo normal carotid Dopplers.    PCP: Judy Pimple, MD   Discharged Condition: good  Hospital Course:  Patient underwent the above stated procedure on 10/22/2023. Patient tolerated the procedure well and brought to the recovery room in good condition and subsequently to the floor. Patient had an uncomplicated hospital course and was stable for discharge.   Disposition: Discharge disposition: 01-Home or Self Care      with follow up in 2 weeks    Follow-up Information     Beverely Low, MD. Schedule an appointment as soon as possible for a visit in 2 week(s).   Specialty: Orthopedic Surgery Why: [phone] Contact information: [address] [place] [address] [place] [address] [phone]         Ranell Patrick,  Brett Canales, MD. Call in 2 week(s).   Specialty: Orthopedic Surgery Why: call [phone] for appt in two weeks Contact information: [address] [place] [address] [phone]                 Dental Antibiotics:  In most cases prophylactic antibiotics for Dental procdeures after total joint surgery are not necessary.  Exceptions are as follows:  1. History of prior total joint infection  2. Severely immunocompromised (Organ Transplant, cancer chemotherapy, Rheumatoid biologic meds such as Humera)  3. Poorly controlled diabetes (A1C &gt; 8.0, blood glucose over 200)  If you have one of  these conditions, contact your surgeon for an antibiotic prescription, prior to your dental procedure.  Discharge Instructions     Call MD / Call 911   Complete by: As directed    If you experience chest pain or shortness of breath, CALL 911 and be transported to the hospital emergency room.  If you develope a fever above 101 F, pus (white drainage) or increased drainage or redness at the wound, or calf pain, call your surgeon's office.   Constipation Prevention   Complete by: As directed    Drink plenty of fluids.  Prune juice may be helpful.  You may use a stool softener, such as Colace (over the counter) 100 mg twice a day.  Use MiraLax (over the counter) for constipation as needed.   Diet - low sodium heart healthy   Complete by: As directed    Increase activity slowly as tolerated   Complete by: As directed    Post-operative opioid taper instructions:   Complete by: As directed    POST-OPERATIVE OPIOID TAPER INSTRUCTIONS: It is important to wean off of your opioid medication as soon as possible. If you do not need pain medication after your surgery it is ok to stop day one. Opioids include: Codeine, Hydrocodone(Norco, Vicodin), Oxycodone(Percocet, oxycontin) and hydromorphone amongst others.  Long term and even short term use of opiods can cause: Increased pain response Dependence Constipation Depression Respiratory depression And more.  Withdrawal symptoms can include Flu like symptoms Nausea, vomiting And more Techniques to manage these symptoms Hydrate well Eat regular healthy meals Stay active Use relaxation techniques(deep breathing, meditating, yoga) Do Not substitute Alcohol to help with tapering If you have been on opioids for less than two weeks and do not have pain than it is ok to stop all together.  Plan to wean off of opioids This plan should start within one week post op of your joint replacement. Maintain the same interval or time between taking each dose  and first decrease the dose.  Cut the total daily intake of opioids by one tablet each day Next start to increase the time between doses. The last dose that should be eliminated is the evening dose.          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   atorvastatin 40 MG tablet Commonly known as: LIPITOR Take 1 tablet (40 mg total) by mouth daily. What changed: when to take this   benzonatate 200 MG capsule Commonly known as: TESSALON TAKE 1 CAPSULE (200 MG TOTAL) BY MOUTH 3 (THREE) TIMES DAILY AS NEEDED FOR COUGH.   busPIRone 15 MG tablet Commonly known as: BUSPAR TAKE 1 TABLET BY MOUTH EVERY DAY   cetirizine 10 MG tablet Commonly known as: ZYRTEC TAKE 1 TABLET BY MOUTH EVERY DAY   chlorhexidine 4 % external liquid Commonly known as: HIBICLENS Apply 15 mLs (1 Application total) topically as directed for 30 doses. Use as directed daily for 5 days every other week for 6 weeks.   clopidogrel 75 MG tablet Commonly known as: PLAVIX TAKE 1 TABLET BY MOUTH EVERY DAY   fluticasone 50 MCG/ACT nasal spray Commonly known as: FLONASE PLACE 1 SPRAY INTO BOTH NOSTRILS DAILY AS NEEDED.   HYDROcodone-acetaminophen 7.5-325 MG tablet Commonly known as: NORCO Take 1 tablet by mouth 3 (three) times daily as needed for severe pain (pain score 7-10).   losartan 100 MG tablet Commonly known as: COZAAR Take 1 tablet (100 mg) by mouth once daily in the morning   Melatonin 3 MG Caps Take 6 mg by mouth at  bedtime.   methocarbamol 500 MG tablet Commonly known as: ROBAXIN TAKE 1 TABLET BY MOUTH EVERY 8 HOURS AS NEEDED FOR MUSCLE SPASMS   metoprolol succinate 25 MG 24 hr tablet Commonly known as: Toprol XL Take 1 tablet (25 mg) by mouth once daily at bedtime   mupirocin ointment 2 % Commonly known as: BACTROBAN Place 1 Application into the nose 2 (two) times daily for 60 doses. Use as directed 2 times daily for 5 days every other week for 6 weeks.   nitroGLYCERIN 0.4 MG SL tablet Commonly known as: NITROSTAT Place 1 tablet (0.4 mg total) under the tongue every 5 (five) minutes as needed.   nystatin powder Commonly known as: MYCOSTATIN/NYSTOP Apply topically 2 (two) times daily as needed. To affected areas to prevent yeast (in skin folds)   pantoprazole 40 MG tablet Commonly known as: PROTONIX TAKE 1 TABLET BY MOUTH EVERY DAY   sertraline 25 MG tablet Commonly known as: ZOLOFT TAKE 1 TABLET (25 MG TOTAL) BY MOUTH DAILY.   Vitamin D 50 MCG (2000 UT) tablet Take 2,000 Units by mouth 2 (two) times daily.          Signed: Verlee Rossetti 10/23/2023, 8:17 AM  [facility] is now [facility] [address], [place], [place] [address] Phone: [phone] Facebook  Instagram  [employer]

## 2023-10-23 NOTE — Progress Notes (Signed)
 Orthopedics Progress Note  Subjective: Right shoulder with no pain this AM. Rested well last night  Objective:  Vitals:   10/23/23 0157 10/23/23 0700  BP: (!) 165/82 (!) 164/78  Pulse: 75 74  Resp:  16  Temp: (!) 97.5 F (36.4 C) (!) 97.4 F (36.3 C)  SpO2: 95% 95%    General: Awake and alert  Musculoskeletal: Right shoulder incision looks good. Dressing changed Neurovascularly intact  Lab Results  Component Value Date   WBC 6.2 10/15/2023   HGB 9.3 (L) 10/23/2023   HCT 31.4 (L) 10/23/2023   MCV 95.0 10/15/2023   PLT 270 10/15/2023       Component Value Date/Time   NA 137 10/23/2023 0323   NA 131 (A) 02/06/2020 0000   NA 141 12/10/2014 1012   K 4.1 10/23/2023 0323   K 3.7 12/10/2014 1012   CL 105 10/23/2023 0323   CL 98 02/28/2009 1250   CO2 21 (L) 10/23/2023 0323   CO2 25 12/10/2014 1012   GLUCOSE 162 (H) 10/23/2023 0323   GLUCOSE 104 12/10/2014 1012   GLUCOSE 103 02/28/2009 1250   BUN 15 10/23/2023 0323   BUN 21 09/08/2016 1104   BUN 15.7 12/10/2014 1012   CREATININE 0.72 10/23/2023 0323   CREATININE 1.20 (H) 07/15/2023 1038   CREATININE 1.01 (H) 09/16/2018 1703   CREATININE 0.9 12/10/2014 1012   CALCIUM 8.3 (L) 10/23/2023 0323   CALCIUM 9.2 12/10/2014 1012   GFRNONAA >60 10/23/2023 0323   GFRNONAA 45 (L) 07/15/2023 1038   GFRAA 56 (L) 05/02/2020 1146    Lab Results  Component Value Date   INR 1.1 12/22/2022   INR 1.0 06/03/2020   INR 1.1 06/18/2019    Assessment/Plan: POD #1 s/p Procedure(s): REVERSE SHOULDER ARTHROPLASTY Stable overnight. Dressing changed Discharge to home after therapy Follow up in two weeks Almedia Balls. Ranell Patrick, MD 10/23/2023 8:16 AM

## 2023-10-23 NOTE — Evaluation (Signed)
 Occupational Therapy Evaluation Patient Details Name: [name] [name] MRN: [id number] DOB: 03-01-1941 Today's Date: 10/23/2023   History of Present Illness   Patient is a 83 year old female who presented on 2/28 with right shoulder rotator cuff tear arthropathy. Patient underwent right total reverse shoulder surgery on 2/28. PMH: anxiety, arthritis, dyspnea on effort, iron deficiency anemia, TIA, psoriasis, ostearthritis.     Clinical Impressions s/p shoulder replacement without functional use of RUE secondary to effects of surgery and interscalene block and shoulder precautions. Therapist provided education and instruction to patient and daughter in regards to exercises, precautions, positioning, donning upper extremity clothing and bathing while maintaining shoulder precautions, ice and edema management, use of ice and donning/doffing sling. Patient and daughter verbalized understanding and demonstrated as needed. Patient needed assistance to donn shirt, underwear, pants, socks and shoes and provided with instruction on compensatory strategies to perform ADLs. Patient to follow up with MD for further therapy needs.       If plan is discharge home, recommend the following:   A little help with walking and/or transfers;A lot of help with bathing/dressing/bathroom;Assistance with cooking/housework;Direct supervision/assist for medications management;Assist for transportation;Help with stairs or ramp for entrance;Direct supervision/assist for financial management;Supervision due to cognitive status     Functional Status Assessment   Patient has had a recent decline in their functional status and demonstrates the ability to make significant improvements in function in a reasonable and predictable amount of time.     Equipment Recommendations   None recommended by OT      Precautions/Restrictions   Precautions Precautions: Shoulder Type of Shoulder Precautions: no shoulder ROM, ok  for hand wrist and elbow AROM, gentle hand to face ok Precaution Booklet Issued: Yes (comment) (handout) Recall of Precautions/Restrictions: Impaired Restrictions Weight Bearing Restrictions Per Provider Order: Yes RUE Weight Bearing Per Provider Order: Non weight bearing     Mobility Bed Mobility Overal bed mobility: Needs Assistance Bed Mobility: Supine to Sit, Sit to Supine     Supine to sit: Supervision Sit to supine: Min assist   General bed mobility comments: to get RLE back into bed.            Balance Overall balance assessment: Mild deficits observed, not formally tested             ADL either performed or assessed with clinical judgement   ADL Overall ADL's : Needs assistance/impaired           Upper Body Bathing Details (indicate cue type and reason): educated on process.             Toilet Transfer: Supervision/safety;Set up;Ambulation Toilet Transfer Details (indicate cue type and reason): with personal cane. patient ditched cane upon entering doorway of bathroom holding onto wall to hike up skirt of gown and advacne to front of toilet ignoring cues fron therapist to keep cane with her and not do clothing management until infront of toilet. Toileting- Clothing Manipulation and Hygiene: Minimal assistance;Sit to/from stand Toileting - Clothing Manipulation Details (indicate cue type and reason): to get adult absorbent undergarments up and down.             Vision   Vision Assessment?: No apparent visual deficits            Pertinent Vitals/Pain Pain Assessment Pain Assessment: No/denies pain     Extremity/Trunk Assessment Upper Extremity Assessment Upper Extremity Assessment: RUE deficits/detail RUE Deficits / Details: shoulder surgery. patient able to wiggle digits and wrist. RUE Coordination:  decreased fine motor;decreased gross motor   Lower Extremity Assessment Lower Extremity Assessment: Overall WFL for tasks assessed    Cervical / Trunk Assessment Cervical / Trunk Assessment: Normal   Communication Communication Communication: No apparent difficulties   Cognition Arousal: Alert Behavior During Therapy: Impulsive Cognition: History of cognitive impairments             OT - Cognition Comments: patietn has poor safety awarness with consistent family support at baseline. patient very talktative during session.                 Following commands: Impaired Following commands impaired: Follows multi-step commands inconsistently, Follows multi-step commands with increased time     Cueing  General Comments   Cueing Techniques: Verbal cues;Gestural cues              Home Living Family/patient expects to be discharged to:: Private residence Living Arrangements: Children Available Help at Discharge: Family;Available 24 hours/day Type of Home: House Home Access: Stairs to enter [employer] of Steps: 3 Entrance Stairs-Rails: Right;Left Home Layout: One level     Bathroom Shower/Tub: Chief Strategy Officer: Handicapped height     Home Equipment: Agricultural consultant (2 wheels);Rollator (4 wheels);Cane - single point;Tub bench   Additional Comments: sleeps in recliner, has hospital bed as well per daughter.      Prior Functioning/Environment Prior Level of Function : Needs assist             Mobility Comments: patient uses cane at baseline ADLs Comments: patient was supervision for UB and min A for LB at times at home.    OT Problem List: Impaired UE functional use;Decreased safety awareness;Decreased knowledge of precautions;Cardiopulmonary status limiting activity        OT Goals(Current goals can be found in the care plan section)   Acute Rehab OT Goals Patient Stated Goal: to go home OT Goal Formulation: With patient/family Time For Goal Achievement: 11/06/23 Potential to Achieve Goals: Fair   OT Frequency:          AM-PAC OT "6 Clicks"  Daily Activity     Outcome Measure Help from another person eating meals?: A Little Help from another person taking care of personal grooming?: A Little Help from another person toileting, which includes using toliet, bedpan, or urinal?: A Lot Help from another person bathing (including washing, rinsing, drying)?: A Lot Help from another person to put on and taking off regular upper body clothing?: A Lot Help from another person to put on and taking off regular lower body clothing?: A Little 6 Click Score: 15   End of Session Equipment Utilized During Treatment: Gait belt;Other (comment) (sling, personal cane) Nurse Communication: Mobility status  Activity Tolerance: Patient tolerated treatment well Patient left: in bed;with call bell/phone within reach;with family/visitor present  OT Visit Diagnosis: Unsteadiness on feet (R26.81);Pain                Time: 8657-8469 OT Time Calculation (min): 30 min Charges:  OT General Charges $OT Visit: 1 Visit OT Evaluation $OT Eval Low Complexity: 1 Low OT Treatments $Self Care/Home Management : 8-22 mins  Rosalio Loud, MS Acute Rehabilitation Department Office# [phone]   Selinda Flavin 10/23/2023, 2:13 PM

## 2023-10-23 NOTE — Plan of Care (Signed)
  Problem: Clinical Measurements: Goal: Ability to maintain clinical measurements within normal limits will improve Outcome: Progressing   Problem: Elimination: Goal: Will not experience complications related to urinary retention Outcome: Progressing   Problem: Safety: Goal: Ability to remain free from injury will improve Outcome: Progressing   Problem: Activity: Goal: Ability to tolerate increased activity will improve Outcome: Progressing   Problem: Pain Management: Goal: Pain level will decrease with appropriate interventions Outcome: Progressing

## 2023-10-25 ENCOUNTER — Encounter (HOSPITAL_COMMUNITY): Payer: Self-pay | Admitting: Orthopedic Surgery

## 2023-10-25 ENCOUNTER — Telehealth: Payer: Self-pay

## 2023-10-25 ENCOUNTER — Encounter: Payer: Self-pay | Admitting: Family Medicine

## 2023-10-25 NOTE — Transitions of Care (Post Inpatient/ED Visit) (Signed)
 10/25/2023  Name: [name] [name] MRN: [id number] DOB: [birth year]  Today's TOC FU Call Status: Today's TOC FU Call Status:: Successful TOC FU Call Completed TOC FU Call Complete Date: 10/25/23 Patient's Name and Date of Birth confirmed.  Transition Care Management Follow-up Telephone Call Date of Discharge: 10/23/23 Discharge Facility: [facility]) Type of Discharge: Emergency Department Reason for ED Visit: Other: (right shoulder surgery) How have you been since you were released from the hospital?: Better Any questions or concerns?: No  Items Reviewed: Did you receive and understand the discharge instructions provided?: Yes Medications obtained,verified, and reconciled?: Yes (Medications Reviewed) Any new allergies since your discharge?: No Dietary orders reviewed?: Yes Do you have support at home?: Yes People in Home: child(ren), adult  Medications Reviewed Today: Medications Reviewed Today     Reviewed by Karena Addison, LPN (Licensed Practical Nurse) on 10/25/23 at 1551  Med List Status: <None>   Medication Order Taking? Sig Documenting Provider Last Dose Status Informant  acetaminophen (TYLENOL) 500 MG tablet [id number] No Take 1,000 mg by mouth 2 (two) times daily as needed for moderate pain or headache.  [provider] 10/21/2023 Active Self  atorvastatin (LIPITOR) 40 MG tablet [id number] No Take 1 tablet (40 mg total) by mouth daily.  Patient taking differently: Take 40 mg by mouth at bedtime.   Marykay Lex, MD 10/21/2023 Bedtime Active Self  benzonatate (TESSALON) 200 MG capsule [id number] No TAKE 1 CAPSULE (200 MG TOTAL) BY MOUTH 3 (THREE) TIMES DAILY AS NEEDED FOR COUGH. Tower, Audrie Gallus, MD 10/21/2023 Active   busPIRone (BUSPAR) 15 MG tablet [id number] No TAKE 1 TABLET BY MOUTH EVERY DAY Tower, Audrie Gallus, MD 10/22/2023  8:30 AM Active Self  cetirizine (ZYRTEC) 10 MG tablet [id number] No TAKE 1 TABLET BY MOUTH EVERY DAY Tower, Marne A, MD 10/22/2023  8:30 AM  Active Self  chlorhexidine (HIBICLENS) 4 % external liquid [id number]  Apply 15 mLs (1 Application total) topically as directed for 30 doses. Use as directed daily for 5 days every other week for 6 weeks. Beverely Low, MD  Active   Cholecalciferol (VITAMIN D) 50 MCG (2000 UT) tablet [id number] No Take 2,000 Units by mouth 2 (two) times daily. [provider] 10/15/2023 Active Self  clopidogrel (PLAVIX) 75 MG tablet [id number] No TAKE 1 TABLET BY MOUTH EVERY DAY Marykay Lex, MD 10/15/2023 Active Self  fluticasone (FLONASE) 50 MCG/ACT nasal spray [id number] No PLACE 1 SPRAY INTO BOTH NOSTRILS DAILY AS NEEDED. Tower, Audrie Gallus, MD More than a month Active Self  HYDROcodone-acetaminophen (NORCO) 7.5-325 MG tablet [id number] No Take 1 tablet by mouth 3 (three) times daily as needed for severe pain (pain score 7-10). [provider] 10/21/2023 Evening Active Self  losartan (COZAAR) 100 MG tablet [id number] No Take 1 tablet (100 mg) by mouth once daily in the morning Marykay Lex, MD 10/21/2023 Evening Active Self  Melatonin 3 MG CAPS [id number] No Take 6 mg by mouth at bedtime.  [provider] 10/21/2023 Bedtime Active Self  methocarbamol (ROBAXIN) 500 MG tablet [id number] No TAKE 1 TABLET BY MOUTH EVERY 8 HOURS AS NEEDED FOR MUSCLE SPASMS Tower, Audrie Gallus, MD 10/21/2023 Evening Active   metoprolol succinate (TOPROL XL) 25 MG 24 hr tablet [id number] No Take 1 tablet (25 mg) by mouth once daily at bedtime Marykay Lex, MD 10/22/2023  8:30 AM Active Self  mupirocin ointment (BACTROBAN) 2 % [id number]  Place 1 Application into the nose 2 (two) times daily for 60  doses. Use as directed 2 times daily for 5 days every other week for 6 weeks. Beverely Low, MD  Active   nitroGLYCERIN (NITROSTAT) 0.4 MG SL tablet [id number] No Place 1 tablet (0.4 mg total) under the tongue every 5 (five) minutes as needed. Marykay Lex, MD Taking Active Self           Med Note [facility] M   Fri Oct 22, 2023 11:39 AM) Never taken  nystatin (MYCOSTATIN/NYSTOP) powder [id number] No Apply topically 2 (two) times daily as needed. To affected areas to prevent yeast (in skin folds) Tower, Audrie Gallus, MD More than a month Active Self  pantoprazole (PROTONIX) 40 MG tablet [id number] No TAKE 1 TABLET BY MOUTH EVERY DAY Marykay Lex, MD 10/22/2023  8:30 AM Active Self  sertraline (ZOLOFT) 25 MG tablet [id number] No TAKE 1 TABLET (25 MG TOTAL) BY MOUTH DAILY. Tower, Skene A, MD 10/22/2023  8:30 AM Active Self            Home Care and Equipment/Supplies: Were Home Health Services Ordered?: NA Any new equipment or medical supplies ordered?: NA  Functional Questionnaire: Do you need assistance with bathing/showering or dressing?: Yes Do you need assistance with meal preparation?: Yes Do you need assistance with eating?: No Do you have difficulty maintaining continence: No Do you need assistance with getting out of bed/getting out of a chair/moving?: Yes Do you have difficulty managing or taking your medications?: Yes  Follow up appointments reviewed: PCP Follow-up appointment confirmed?: No MD Provider Line [phone] Given: No Specialist Hospital Follow-up appointment confirmed?: No Reason Specialist Follow-Up Not Confirmed: Patient has Specialist Provider Number and will Call for Appointment Do you need transportation to your follow-up appointment?: No Do you understand care options if your condition(s) worsen?: Yes-patient verbalized understanding  patient had shoulder surgery  SIGNATURE Karena Addison, LPN [facility] Nurse Health Advisor Direct Dial [phone]

## 2023-10-25 NOTE — Telephone Encounter (Signed)
 I'm confused from looking at the chart  Has orthopedics reached out to her?  Does she have follow up with them?   Thanks

## 2023-11-02 ENCOUNTER — Inpatient Hospital Stay: Payer: Medicare Other

## 2023-11-02 ENCOUNTER — Inpatient Hospital Stay (HOSPITAL_BASED_OUTPATIENT_CLINIC_OR_DEPARTMENT_OTHER): Payer: Medicare Other | Admitting: Hematology and Oncology

## 2023-11-02 ENCOUNTER — Inpatient Hospital Stay: Payer: Medicare Other | Attending: Hematology and Oncology

## 2023-11-02 VITALS — BP 179/69 | HR 78 | Temp 98.0°F | Resp 19 | Ht 62.75 in | Wt 174.8 lb

## 2023-11-02 DIAGNOSIS — D649 Anemia, unspecified: Secondary | ICD-10-CM | POA: Diagnosis not present

## 2023-11-02 DIAGNOSIS — M353 Polymyalgia rheumatica: Secondary | ICD-10-CM | POA: Diagnosis not present

## 2023-11-02 DIAGNOSIS — D638 Anemia in other chronic diseases classified elsewhere: Secondary | ICD-10-CM | POA: Diagnosis not present

## 2023-11-02 DIAGNOSIS — D62 Acute posthemorrhagic anemia: Secondary | ICD-10-CM | POA: Diagnosis not present

## 2023-11-02 DIAGNOSIS — Z79899 Other long term (current) drug therapy: Secondary | ICD-10-CM | POA: Diagnosis not present

## 2023-11-02 LAB — SAMPLE TO BLOOD BANK

## 2023-11-02 LAB — CBC WITH DIFFERENTIAL (CANCER CENTER ONLY)
Abs Immature Granulocytes: 0.04 10*3/uL (ref 0.00–0.07)
Basophils Absolute: 0.1 10*3/uL (ref 0.0–0.1)
Basophils Relative: 1 %
Eosinophils Absolute: 0.1 10*3/uL (ref 0.0–0.5)
Eosinophils Relative: 2 %
HCT: 31.3 % — ABNORMAL LOW (ref 36.0–46.0)
Hemoglobin: 9.7 g/dL — ABNORMAL LOW (ref 12.0–15.0)
Immature Granulocytes: 1 %
Lymphocytes Relative: 20 %
Lymphs Abs: 1.5 10*3/uL (ref 0.7–4.0)
MCH: 27.6 pg (ref 26.0–34.0)
MCHC: 31 g/dL (ref 30.0–36.0)
MCV: 89.2 fL (ref 80.0–100.0)
Monocytes Absolute: 0.4 10*3/uL (ref 0.1–1.0)
Monocytes Relative: 6 %
Neutro Abs: 5.2 10*3/uL (ref 1.7–7.7)
Neutrophils Relative %: 70 %
Platelet Count: 329 10*3/uL (ref 150–400)
RBC: 3.51 MIL/uL — ABNORMAL LOW (ref 3.87–5.11)
RDW: 13.4 % (ref 11.5–15.5)
WBC Count: 7.3 10*3/uL (ref 4.0–10.5)
nRBC: 0 % (ref 0.0–0.2)

## 2023-11-02 LAB — FERRITIN: Ferritin: 86 ng/mL (ref 11–307)

## 2023-11-02 LAB — IRON AND IRON BINDING CAPACITY (CC-WL,HP ONLY)
Iron: 40 ug/dL (ref 28–170)
Saturation Ratios: 14 % (ref 10.4–31.8)
TIBC: 277 ug/dL (ref 250–450)
UIBC: 237 ug/dL (ref 148–442)

## 2023-11-02 MED ORDER — EPOETIN ALFA-EPBX 40000 UNIT/ML IJ SOLN
40000.0000 [IU] | Freq: Once | INTRAMUSCULAR | Status: AC
  Administered 2023-11-02: 40000 [IU] via SUBCUTANEOUS
  Filled 2023-11-02: qty 1

## 2023-11-02 NOTE — Progress Notes (Signed)
 Patient Care Team: Tower, Audrie Gallus, MD as PCP - General [facility]bie [place] Piedad Climes, MD as PCP - Cardiology (Cardiology) Sheran Luz, MD as Consulting Physician (Physical Medicine and Rehabilitation) Zenovia Jordan, MD as Consulting Physician (Rheumatology) Haverstock, Elvin So, MD as Referring Physician (Dermatology) Serena Croissant, MD as Consulting Physician (Hematology and Oncology) Kathyrn Sheriff, [facility] (Inactive) as Pharmacist (Pharmacist)  DIAGNOSIS:  Encounter Diagnosis  Name Primary?   Anemia of chronic disease Yes      CHIEF COMPLIANT: Follow-up of anemia of chronic disease status post recent shoulder surgery  HISTORY OF PRESENT ILLNESS:  History of Present Illness The patient, with a recent history of surgery on February 28th, presents with severe discomfort in the right shoulder. The patient reports that this is her first time out since the surgery and she is feeling exhausted and uncomfortable. The patient is scheduled to see an orthopedic surgeon in two days. The patient's hemoglobin levels have been fluctuating post-surgery, with readings of 10.2, 9.3, and currently 9.7, indicating an improvement. The patient has not been mobilized much during her hospital stay and is experiencing a high level of pain.     ALLERGIES:  is allergic to sulfonamide derivatives, ace inhibitors, hydrochlorothiazide, oxaprozin, pregabalin, and amoxicillin-pot clavulanate.  MEDICATIONS:  Current Outpatient Medications  Medication Sig Dispense Refill   acetaminophen (TYLENOL) 500 MG tablet Take 1,000 mg by mouth 2 (two) times daily as needed for moderate pain or headache.      atorvastatin (LIPITOR) 40 MG tablet Take 1 tablet (40 mg total) by mouth daily. (Patient taking differently: Take 40 mg by mouth at bedtime.) 90 tablet 3   benzonatate (TESSALON) 200 MG capsule TAKE 1 CAPSULE (200 MG TOTAL) BY MOUTH 3 (THREE) TIMES DAILY AS NEEDED FOR COUGH. 90 capsule 2   busPIRone (BUSPAR) 15 MG tablet  TAKE 1 TABLET BY MOUTH EVERY DAY 90 tablet 3   cetirizine (ZYRTEC) 10 MG tablet TAKE 1 TABLET BY MOUTH EVERY DAY 90 tablet 1   chlorhexidine (HIBICLENS) 4 % external liquid Apply 15 mLs (1 Application total) topically as directed for 30 doses. Use as directed daily for 5 days every other week for 6 weeks. 946 mL 1   Cholecalciferol (VITAMIN D) 50 MCG (2000 UT) tablet Take 2,000 Units by mouth 2 (two) times daily.     clopidogrel (PLAVIX) 75 MG tablet TAKE 1 TABLET BY MOUTH EVERY DAY 90 tablet 2   fluticasone (FLONASE) 50 MCG/ACT nasal spray PLACE 1 SPRAY INTO BOTH NOSTRILS DAILY AS NEEDED. 48 mL 1   HYDROcodone-acetaminophen (NORCO) 7.5-325 MG tablet Take 1 tablet by mouth 3 (three) times daily as needed for severe pain (pain score 7-10).     losartan (COZAAR) 100 MG tablet Take 1 tablet (100 mg) by mouth once daily in the morning 90 tablet 3   Melatonin 3 MG CAPS Take 6 mg by mouth at bedtime.      methocarbamol (ROBAXIN) 500 MG tablet TAKE 1 TABLET BY MOUTH EVERY 8 HOURS AS NEEDED FOR MUSCLE SPASMS 90 tablet 2   metoprolol succinate (TOPROL XL) 25 MG 24 hr tablet Take 1 tablet (25 mg) by mouth once daily at bedtime 90 tablet 3   mupirocin ointment (BACTROBAN) 2 % Place 1 Application into the nose 2 (two) times daily for 60 doses. Use as directed 2 times daily for 5 days every other week for 6 weeks. 60 g 0   nitroGLYCERIN (NITROSTAT) 0.4 MG SL tablet Place 1 tablet (0.4 mg total) under  the tongue every 5 (five) minutes as needed. 25 tablet 3   nystatin (MYCOSTATIN/NYSTOP) powder Apply topically 2 (two) times daily as needed. To affected areas to prevent yeast (in skin folds) 30 g 1   pantoprazole (PROTONIX) 40 MG tablet TAKE 1 TABLET BY MOUTH EVERY DAY 90 tablet 2   sertraline (ZOLOFT) 25 MG tablet TAKE 1 TABLET (25 MG TOTAL) BY MOUTH DAILY. 90 tablet 2   No current facility-administered medications for this visit.    PHYSICAL EXAMINATION: ECOG PERFORMANCE STATUS: 1 - Symptomatic but  completely ambulatory  Vitals:   11/02/23 1336  BP: (!) 179/69  Pulse: 78  Resp: 19  Temp: 98 F (36.7 C)  SpO2: 100%   Filed Weights   11/02/23 1336  Weight: 174 lb 12.8 oz (79.3 kg)      LABORATORY DATA:  I have reviewed the data as listed    Latest Ref Rng & Units 10/23/2023    3:23 AM 10/15/2023   11:54 AM 07/15/2023   10:38 AM  CMP  Glucose 70 - 99 mg/dL 829  98  562   BUN 8 - 23 mg/dL 15  35  22   Creatinine 0.44 - 1.00 mg/dL 1.30  8.65  7.84   Sodium 135 - 145 mmol/L 137  135  139   Potassium 3.5 - 5.1 mmol/L 4.1  4.2  4.0   Chloride 98 - 111 mmol/L 105  103  108   CO2 22 - 32 mmol/L 21  23  26    Calcium 8.9 - 10.3 mg/dL 8.3  9.1  9.0   Total Protein 6.5 - 8.1 g/dL  7.3  6.7   Total Bilirubin 0.0 - 1.2 mg/dL  0.8  0.4   Alkaline Phos 38 - 126 U/L  54  60   AST 15 - 41 U/L  41  17   ALT 0 - 44 U/L  27  9     Lab Results  Component Value Date   WBC 7.3 11/02/2023   HGB 9.7 (L) 11/02/2023   HCT 31.3 (L) 11/02/2023   MCV 89.2 11/02/2023   PLT 329 11/02/2023   NEUTROABS 5.2 11/02/2023    ASSESSMENT & PLAN:  Anemia of chronic disease 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    Lab review  10/12/2017: Hemoglobin 10.2, MCV 94, normal WBC and platelet counts, creatinine 1.02 04/13/2019: Hemoglobin 9 06/03/20: Hb 11.2 (did not receive Retacrit) 06/27/2020: Hemoglobin 10.3 (receiving Retacrit injection) 10/15/2020: Hemoglobin 10.2 (receiving Retacrit) 04/17/2021: Hemoglobin 10.6 (receiving Retacrit) now planning to do the injection every 3 months  07/21/21: hemoglobin: 9.9 (receiving Retacrit) 04/23/2022: Hemoglobin 10.7, ferritin 63, iron saturation 14%, B12 575 07/15/2022: Hemoglobin 9.9 01/13/2023: Hemoglobin 10.6 04/15/2023: Hemoglobin 10.1 10/15/2023: Hemoglobin 10.2 11/02/2023: Hemoglobin 9.7     Current treatment:  Retacrit 40,000 units currently receiving it every 12 weeks. (Inj if Hb is less than 10.5) last Retacrit was given in Nov 2024 Reverse shoulder arthroplasty 10/22/2023 Right shoulder severe discomfort: Patient is going to see her orthopedic surgeon ------------------------------------- Assessment and Plan Assessment & Plan Normochromic normocytic anemia Hemoglobin improved to 9.7 g/dL, indicating recovery from post-surgical blood loss. No transfusion needed. - Administer injection today. - Schedule lab injection and MD visit in 12 weeks.  Post-surgical shoulder pain Severe right shoulder pain post-surgery, likely related to recovery. - Follow up with orthopedic surgeon for shoulder pain management.  No orders of the defined types were placed in this encounter.  The patient has a good understanding of the overall plan. she agrees with it. she will call with any problems that may develop before the next visit here. Total time spent: 30 mins including face to face time and time spent for planning, charting and co-ordination of care   Tamsen Meek, MD 11/02/23

## 2023-11-02 NOTE — Patient Instructions (Signed)

## 2023-11-02 NOTE — Assessment & Plan Note (Signed)
 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    Lab review  10/12/2017: Hemoglobin 10.2, MCV 94, normal WBC and platelet counts, creatinine 1.02 04/13/2019: Hemoglobin 9 06/03/20: Hb 11.2 (did not receive Retacrit) 06/27/2020: Hemoglobin 10.3 (receiving Retacrit injection) 10/15/2020: Hemoglobin 10.2 (receiving Retacrit) 04/17/2021: Hemoglobin 10.6 (receiving Retacrit) now planning to do the injection every 3 months  07/21/21: hemoglobin: 9.9 (receiving Retacrit) 04/23/2022: Hemoglobin 10.7, ferritin 63, iron saturation 14%, B12 575 07/15/2022: Hemoglobin 9.9 01/13/2023: Hemoglobin 10.6 04/15/2023: Hemoglobin 10.1 10/15/2023: Hemoglobin 10.2 11/02/2023: Hemoglobin     Current treatment: Retacrit 40,000 units currently receiving it every 12 weeks. (Inj if Hb is less than 10.5) last Retacrit was given in Nov 2024 Reverse shoulder arthroplasty 10/22/2023

## 2023-11-04 DIAGNOSIS — Z4789 Encounter for other orthopedic aftercare: Secondary | ICD-10-CM | POA: Diagnosis not present

## 2023-11-08 ENCOUNTER — Telehealth: Payer: Self-pay | Admitting: Hematology and Oncology

## 2023-11-08 NOTE — Telephone Encounter (Signed)
 Scheduled appointments per 3/11 los. Talked with the patient and she is aware of the made appointments.

## 2023-11-30 ENCOUNTER — Other Ambulatory Visit: Payer: Self-pay | Admitting: Cardiology

## 2023-12-02 ENCOUNTER — Other Ambulatory Visit: Payer: Self-pay | Admitting: *Deleted

## 2023-12-02 MED ORDER — NYSTATIN 100000 UNIT/GM EX POWD: Freq: Two times a day (BID) | CUTANEOUS | 1 refills | Status: AC | PRN

## 2023-12-02 NOTE — Telephone Encounter (Signed)
 Received fax saying pt is requesting a new Rx for this. Last filled on 07/23/20 #30 g with 1 refill  CPE scheduled 12/23/23

## 2023-12-10 ENCOUNTER — Other Ambulatory Visit: Payer: Self-pay | Admitting: Family Medicine

## 2023-12-12 ENCOUNTER — Telehealth: Payer: Self-pay | Admitting: Family Medicine

## 2023-12-12 DIAGNOSIS — E785 Hyperlipidemia, unspecified: Secondary | ICD-10-CM

## 2023-12-12 DIAGNOSIS — I10 Essential (primary) hypertension: Secondary | ICD-10-CM

## 2023-12-12 DIAGNOSIS — E559 Vitamin D deficiency, unspecified: Secondary | ICD-10-CM

## 2023-12-12 DIAGNOSIS — R7309 Other abnormal glucose: Secondary | ICD-10-CM

## 2023-12-12 DIAGNOSIS — Z79899 Other long term (current) drug therapy: Secondary | ICD-10-CM

## 2023-12-12 NOTE — Telephone Encounter (Signed)
-----   Message from Gerry Krone sent at 11/29/2023  3:02 PM EDT ----- Regarding: Lab orders for [facility], 4.24.25 Patient is scheduled for CPX labs, please order future labs, Thanks , Anselmo Kings

## 2023-12-13 ENCOUNTER — Ambulatory Visit (INDEPENDENT_AMBULATORY_CARE_PROVIDER_SITE_OTHER): Payer: Medicare Other

## 2023-12-13 ENCOUNTER — Other Ambulatory Visit: Payer: Self-pay | Admitting: Family Medicine

## 2023-12-13 VITALS — BP 179/69 | Ht 62.75 in | Wt 170.7 lb

## 2023-12-13 DIAGNOSIS — Z2821 Immunization not carried out because of patient refusal: Secondary | ICD-10-CM | POA: Diagnosis not present

## 2023-12-13 DIAGNOSIS — Z5320 Procedure and treatment not carried out because of patient's decision for unspecified reasons: Secondary | ICD-10-CM | POA: Diagnosis not present

## 2023-12-13 DIAGNOSIS — Z0000 Encounter for general adult medical examination without abnormal findings: Secondary | ICD-10-CM | POA: Diagnosis not present

## 2023-12-13 NOTE — Telephone Encounter (Signed)
 Copied from CRM [id number]. Topic: Clinical - Medication Refill >> Dec 13, 2023  3:25 PM Carlatta H wrote: Most Recent Primary Care Visit:  Provider: Deri Fleet A  Department: [facility]  Visit Type: OFFICE VISIT  Date: 06/11/2023  Medication: methocarbamol  (ROBAXIN ) 500 MG tablet [[id number]]  Has the patient contacted their pharmacy? Yes (Agent: If no, request that the patient contact the pharmacy for the refill. If patient does not wish to contact the pharmacy document the reason why and proceed with request.) (Agent: If yes, when and what did the pharmacy advise?) Advised to contact office  Is this the correct pharmacy for this prescription? Yes If no, delete pharmacy and type the correct one.  This is the patient's preferred pharmacy:  [id number] Jonette Nestle, [place] - [address] [place] [address] Phone: [phone] Fax: [phone]   Has the prescription been filled recently? No  Is the patient out of the medication? Yes  Has the patient been seen for an appointment in the last year OR does the patient have an upcoming appointment? Yes  Can we respond through MyChart? No  Agent: Please be advised that Rx refills may take up to 3 business days. We ask that you follow-up with your pharmacy.

## 2023-12-13 NOTE — Telephone Encounter (Signed)
 Last filled on 09/08/23 #90 tabs/ 2 refills   CPE on 12/23/23

## 2023-12-13 NOTE — Progress Notes (Signed)
 Because this visit was a virtual/telehealth visit,  certain criteria was not obtained, such a blood pressure, CBG if applicable, and timed get up and go. Any medications not marked as "taking" were not mentioned during the medication reconciliation part of the visit. Any vitals not documented were not able to be obtained due to this being a telehealth visit or patient was unable to self-report a recent blood pressure reading due to a lack of equipment at home via telehealth. Vitals that have been documented are verbally provided by the patient.  Subjective:   [name] [name] is a 83 y.o. who presents for a Medicare Wellness preventive visit.  Visit Complete: Virtual I connected with  [name] [name] on 12/13/23 by a audio enabled telemedicine application and verified that I am speaking with the correct person using two identifiers.  Patient Location: Home  Provider Location: Home Office  I discussed the limitations of evaluation and management by telemedicine. The patient expressed understanding and agreed to proceed.  Vital Signs: Because this visit was a virtual/telehealth visit, some criteria may be missing or patient reported. Any vitals not documented were not able to be obtained and vitals that have been documented are patient reported.  VideoDeclined- This patient declined Librarian, academic. Therefore the visit was completed with audio only.  Persons Participating in Visit: Patient.  AWV Questionnaire: No: Patient Medicare AWV questionnaire was not completed prior to this visit.  Cardiac Risk Factors include: advanced age (>67men, >11 women);hypertension;obesity (BMI >30kg/m2)     Objective:    Today's Vitals   12/13/23 0856  BP: (!) 179/69  Weight: 170 lb 11.2 oz (77.4 kg)  Height: 5' 2.75" (1.594 m)   Body mass index is 30.48 kg/m.     12/13/2023    8:54 AM 10/22/2023   11:43 AM 10/15/2023   11:19 AM 01/13/2023    9:53 AM 12/22/2022    8:47  PM 12/09/2022    9:22 AM 07/15/2022    2:39 PM  Advanced Directives  Does Patient Have a Medical Advance Directive? Yes Yes Yes No No Yes Yes  Type of Estate agent of [place];Living will Healthcare Power of [place];Living will Healthcare Power of [place];Living will Healthcare Power of [place];Living will  Healthcare Power of [place];Living will Healthcare Power of [place];Living will  Does patient want to make changes to medical advance directive? No - Patient declined No - Patient declined  No - Patient declined   No - Patient declined  Copy of Healthcare Power of Attorney in Chart? No - copy requested No - copy requested  No - copy requested  No - copy requested No - copy requested    Current Medications (verified) Outpatient Encounter Medications as of 12/13/2023  Medication Sig   acetaminophen  (TYLENOL ) 500 MG tablet Take 1,000 mg by mouth 2 (two) times daily as needed for moderate pain (pain score 4-6) or headache.   atorvastatin  (LIPITOR) 40 MG tablet TAKE 1 TABLET BY MOUTH EVERY DAY   benzonatate  (TESSALON ) 200 MG capsule TAKE 1 CAPSULE (200 MG TOTAL) BY MOUTH 3 (THREE) TIMES DAILY AS NEEDED FOR COUGH.   busPIRone  (BUSPAR ) 15 MG tablet TAKE 1 TABLET BY MOUTH EVERY DAY   cetirizine  (ZYRTEC ) 10 MG tablet TAKE 1 TABLET BY MOUTH EVERY DAY   chlorhexidine  (HIBICLENS ) 4 % external liquid Apply 15 mLs (1 Application total) topically as directed for 30 doses. Use as directed daily for 5 days every other week for 6 weeks.  Cholecalciferol  (VITAMIN D ) 50 MCG (2000 UT) tablet Take 2,000 Units by mouth 2 (two) times daily.   clopidogrel  (PLAVIX ) 75 MG tablet TAKE 1 TABLET BY MOUTH EVERY DAY   fluticasone  (FLONASE ) 50 MCG/ACT nasal spray PLACE 1 SPRAY INTO BOTH NOSTRILS DAILY AS NEEDED.   HYDROcodone -acetaminophen  (NORCO) 7.5-325 MG tablet Take 1 tablet by mouth 3 (three) times daily as needed for severe pain (pain score 7-10).   losartan  (COZAAR ) 100 MG tablet Take 1  tablet (100 mg) by mouth once daily in the morning   Melatonin 3 MG CAPS Take 6 mg by mouth at bedtime.    methocarbamol  (ROBAXIN ) 500 MG tablet TAKE 1 TABLET BY MOUTH EVERY 8 HOURS AS NEEDED FOR MUSCLE SPASMS   metoprolol  succinate (TOPROL  XL) 25 MG 24 hr tablet Take 1 tablet (25 mg) by mouth once daily at bedtime   nitroGLYCERIN  (NITROSTAT ) 0.4 MG SL tablet Place 1 tablet (0.4 mg total) under the tongue every 5 (five) minutes as needed.   nystatin  (MYCOSTATIN /NYSTOP ) powder Apply topically 2 (two) times daily as needed. To affected areas to prevent yeast (in skin folds)   pantoprazole  (PROTONIX ) 40 MG tablet TAKE 1 TABLET BY MOUTH EVERY DAY   sertraline  (ZOLOFT ) 25 MG tablet TAKE 1 TABLET (25 MG TOTAL) BY MOUTH DAILY.   No facility-administered encounter medications on file as of 12/13/2023.    Allergies (verified) Sulfonamide derivatives, Ace inhibitors, Hydrochlorothiazide , Oxaprozin, Pregabalin, and Amoxicillin-pot clavulanate   History: Past Medical History:  Diagnosis Date   Anemia    of chronic disease   Anxiety    Arthritis    "psoratic arthritis; new dx" (09/11/2016)   CAD S/P percutaneous coronary angioplasty 08/2016   mLCx 90% -> 0% (2.75 mm x 16 mm) Synergy DES PCI Ostial D1 70% (not PTCA target). Moderate ostial and mD2.  LAD tandem 50% stenoses w/ negative FFR (0.86)   Cancer (HCC)    skin cancer on leg   Chronic lower back pain    Complication of anesthesia 1980 and 1982   trouble waking up after breast biopsy, many surgeries since no problem   Depression    Dyspnea    Dyspnea on effort - CHRONIC    partial relief of Sx post PCI of L Circumflex.   Fibrocystic breast    GERD (gastroesophageal reflux disease)    Headache    "monthly" (09/11/2016)   History of blood transfusion    "several since age 80; I've had them after most of my surgeries" (09/11/2016)   History of myocardial infarction     - Myoview  [name] 2018 indicated small Inferior - Inferoseptal Infarct (vs.  rest ischemia). Echo with inferoseptal hypokinesis.   Hx of skin cancer, basal cell 2013 and 2012   right inner, lower leg; several scattered around my body   Hypertension    Iron deficiency anemia    "I've had an iron infusion"   Osteoarthritis    Osteopenia    Peptic ulcer disease    H pylori   PMR (polymyalgia rheumatica) (HCC)    Pneumonia    PONV (postoperative nausea and vomiting)    Psoriasis    Stroke (HCC)    hx of mini stroke   TIA (transient ischemic attack) 06/03/2020   Symptoms of right arm weakness/numbness and slurred speech as well as right droopm => MRI brain/MRA head normal.  Normal echo normal carotid Dopplers.   Past Surgical History:  Procedure Laterality Date   APPENDECTOMY     BACK  SURGERY     x 6    BREAST BIOPSY Left 1980 and 1982   "benign"   CARDIAC CATHETERIZATION N/A 09/11/2016   Procedure: Left Heart Cath and Coronary Angiography;  Surgeon: Arleen Lacer, MD;  Location[facility] INVASIVE CV LAB;  Service: Cardiovascular: mLCx 90% * (PCI). Ostial D1 70% (not PTCA target). Mod ostial and mD2.  LAD tandem 50% stenoses w/ negative FFR (0.86)   CARDIAC CATHETERIZATION N/A 09/11/2016   Procedure: Intravascular Pressure Wire/FFR Study;  Surgeon: Arleen Lacer, MD;  Location: [facility] INVASIVE CV LAB;  Service: Cardiovascular: --  LAD tandem 50% stenoses w/ negative FFR (0.86   CARDIAC CATHETERIZATION N/A 09/11/2016   Procedure: Coronary Stent Intervention;  Surgeon: Arleen Lacer, MD;  Location: [facility] INVASIVE CV LAB;  Service: Cardiovascular: -- DES PCI mLCx 90%--0% SYNERGY DES 2.75 MM X 16 MM.   CARDIAC CATHETERIZATION  2001   non-obstructive CAD   CATARACT EXTRACTION W/ INTRAOCULAR LENS  IMPLANT, BILATERAL  July -  August 2017   DILATION AND CURETTAGE OF UTERUS  X 3   ESOPHAGOGASTRODUODENOSCOPY  03/2009   ulcer,HP, GERD stricture   EXCISIONAL HEMORRHOIDECTOMY     HIP ARTHROPLASTY Left 2010   JOINT REPLACEMENT     KNEE ARTHROSCOPY Bilateral    LAPAROSCOPIC  CHOLECYSTECTOMY  2005   LUMBAR DISC SURGERY  11/1984; 1987; 1989; 2004; 2005 X 2   deg. disk lumber spine    MOHS SURGERY Right    "inside my lower leg"   NASAL SINUS SURGERY  1970s   NM MYOVIEW  LTD  08/28/2016   INTERMEDIATE RISK (b/c reduced EF & ? small infarct) -- EF ~35-40%.  ? Prior Small Inferior-apical & septal infarct (w/o ischemia).  Difuse HK - worse in inferoapex.  Suggest prior infarct with no ischemia. Ischemic area correlates with echo WMA   NM MYOVIEW  LTD  05/27/2021   Findings consistent with prior anteroapical infarct with fixed defect.  No peri-infarct ischemia.  Notable improvement from prior study.:   ORIF FEMUR FRACTURE Right 01/04/2020   Procedure: OPEN REDUCTION INTERNAL FIXATION (ORIF) DISTAL FEMUR FRACTURE;  Surgeon: Adonica Hoose, MD;  Location: [facility] OR;  Service: Orthopedics;  Laterality: Right;   REVERSE SHOULDER ARTHROPLASTY Left 12/17/2017   Procedure: LEFT REVERSE SHOULDER ARTHROPLASTY;  Surgeon: Winston Hawking, MD;  Location: [facility] OR;  Service: Orthopedics;  Laterality: Left;   REVERSE SHOULDER ARTHROPLASTY Right 10/22/2023   Procedure: REVERSE SHOULDER ARTHROPLASTY;  Surgeon: Winston Hawking, MD;  Location: [facility] ORS;  Service: Orthopedics;  Laterality: Right;  interscale block   TOTAL HIP ARTHROPLASTY Right 04/04/2013   Procedure: RIGHT TOTAL HIP ARTHROPLASTY ANTERIOR APPROACH;  Surgeon: Bevin Bucks, MD;  Location: [facility] ORS;  Service: Orthopedics;  Laterality: Right;   TOTAL KNEE ARTHROPLASTY Right 2009   TRANSTHORACIC ECHOCARDIOGRAM  1/'18; 1/'19   a) 1/'18: mild conc LVH. EF 45-50% - anteroseptal & inferoseptal HK. Mod AI.; b) 1/'19 - post PCI: (post PCI) --> EF normalized:  Normal LV systolic function; mild diastolic dysfunction; mild   LVH; sclerotic aortic valve with mild AI.  No RWMA   TRANSTHORACIC ECHOCARDIOGRAM  06/04/2020    for TIA: Normal LV size and function.  No or WMA.  EF 60 to 65%.  GR 1 DD with elevated LAP.  Normal RV size and function.  Normal RVP,  RAP.  Mild aortic valve calcification-sclerosis but no stenosis.  Trivial AI.  No embolic source   TUBAL LIGATION     VAGINAL HYSTERECTOMY  1970s  partial    Family History  Problem Relation Age of Onset   Arthritis Mother    Emphysema Mother    Hyperlipidemia Mother    Hypertension Mother    Heart disease Mother    Heart disease Brother        CAD   Diabetes Paternal Grandmother    Heart disease Maternal Aunt    Social History   Socioeconomic History   Marital status: Widowed    Spouse name: Not on file   Number of children: 5   Years of education: Not on file   Highest education level: Not on file  Occupational History   Not on file  Tobacco Use   Smoking status: Never    Passive exposure: Never   Smokeless tobacco: Never  Vaping Use   Vaping status: Never Used  Substance and Sexual Activity   Alcohol use: Not Currently   Drug use: No   Sexual activity: Not Currently  Other Topics Concern   Not on file  Social History Narrative   Now Widowed as of [name] 2020. Birthed 5 children. Lost one at age 59.   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   9 grandchildren    Social Drivers of Corporate investment banker Strain: Low Risk  (12/13/2023)   Overall Financial Resource Strain (CARDIA)    Difficulty of Paying Living Expenses: Not hard at all  Food Insecurity: No Food Insecurity (12/13/2023)   Hunger Vital Sign    Worried About Running Out of Food in the Last Year: Never true    Ran Out of Food in the Last Year: Never true  Transportation Needs: No Transportation Needs (12/13/2023)   PRAPARE - Administrator, Civil Service (Medical): No    Lack of Transportation (Non-Medical): No  Physical Activity: Inactive (12/13/2023)   Exercise Vital Sign    Days of Exercise per Week: 0 days    Minutes of  Exercise per Session: 0 min  Stress: No Stress Concern Present (12/13/2023)   [employer] of Occupational Health - Occupational Stress Questionnaire    Feeling of Stress : Not at all  Social Connections: Moderately Isolated (12/13/2023)   Social Connection and Isolation Panel [NHANES]    Frequency of Communication with Friends and Family: More than three times a week    Frequency of Social Gatherings with Friends and Family: More than three times a week    Attends Religious Services: More than 4 times per year    Active Member of [employer] or Organizations: No    Attends Banker Meetings: Never    Marital Status: Widowed    Tobacco Counseling Counseling given: Not Answered    Clinical Intake:  Pre-visit preparation completed: Yes  Pain : No/denies pain     BMI - recorded: 30.48 Nutritional Status: BMI > 30  Obese Nutritional Risks: None Diabetes: No  Lab Results  Component Value Date   HGBA1C 5.6 06/11/2023   HGBA1C 5.7 12/17/2022   HGBA1C 5.3 10/19/2018     How often do you need to have someone help you when you read instructions, pamphlets, or other written materials from your doctor or pharmacy?: 1 - Never  Interpreter Needed?: No  Information entered by :: [employer]  Activities of Daily Living     12/13/2023    9:13 AM 12/13/2023    9:10 AM  In your present state of health, do you have any difficulty performing the following activities:  Hearing? 0 0  Vision? 0 0  Difficulty concentrating or making decisions? 1 0  Walking or climbing stairs? 1 1  Comment climbing stairs climbing stairs  Dressing or bathing? 0 0  Doing errands, shopping? 0 0  Preparing Food and eating ? N N  Using the Toilet? N N  In the past six months, have you accidently leaked urine? Y N  Comment yes all the time   Do you have problems with loss of bowel control? N N  Managing your Medications? Y N  Comment daughter helps   Managing your Finances? N N   Housekeeping or managing your Housekeeping? N N    Patient Care Team: Tower, Manley Seeds, MD as PCP - General Addie Holstein Vashti Gentles, MD as PCP - Cardiology (Cardiology) Adelaide Adjutant, MD as Consulting Physician (Physical Medicine and Rehabilitation) Stefan Edge, MD as Consulting Physician (Rheumatology) [place], Thornell Flirt, MD as Referring Physician (Dermatology) Cameron Cea, MD as Consulting Physician (Hematology and Oncology) Jonathan Neighbor, [facility] (Inactive) as Pharmacist (Pharmacist)  Indicate any recent Medical Services you may have received from other than [facility] providers in the past year (date may be approximate).     Assessment:   This is a routine wellness examination for Wilbur.  Hearing/Vision screen Hearing Screening - Comments:: Patient has no hearing difficulties Vision Screening - Comments:: Patient wears glasses   Goals Addressed               This Visit's Progress     Patient Stated (pt-stated)        Patient wants to discuss a [place] [employer] Game        Depression Screen     12/13/2023    9:16 AM 12/09/2022    9:22 AM 03/11/2022   11:52 AM 12/03/2021    9:09 AM 11/04/2021   11:00 AM 10/30/2020   12:15 PM 07/22/2020   11:32 AM  PHQ 2/9 Scores  PHQ - 2 Score 2 0 0 0 0 0 0  PHQ- 9 Score 2     0     Fall Risk     12/13/2023    9:06 AM 12/09/2022    9:24 AM 03/11/2022   11:52 AM 12/03/2021    9:24 AM 11/04/2021   11:00 AM  Fall Risk   Falls in the past year? 1 1 0 0 0  Number falls in past yr: 1 1  0 0  Comment  lost balance fell backwards     Injury with Fall? 1 0  0 0  Risk for fall due to : History of fall(s);Impaired balance/gait;Impaired mobility No Fall Risks;Medication side effect;Impaired mobility;Impaired balance/gait  Impaired balance/gait;Impaired mobility   Follow up Falls evaluation completed Falls prevention discussed;Falls evaluation completed Falls evaluation completed Falls prevention discussed Falls evaluation completed     MEDICARE RISK AT HOME:  Medicare Risk at Home Any stairs in or around the home?: Yes If so, are there any without handrails?: No Home free of loose throw rugs in walkways, pet beds, electrical cords, etc?: Yes Adequate lighting in your home to reduce risk of falls?: Yes Life alert?: No Use of a cane, walker or w/c?: Yes (cane and walker sometimes) Grab bars in the bathroom?: Yes Shower chair or bench in shower?: Yes  Elevated toilet seat or a handicapped toilet?: Yes  TIMED UP AND GO:  Was the test performed?  No  Cognitive Function: 6CIT completed    10/30/2020   12:18 PM 10/27/2019   11:53 AM 10/19/2018    9:48 AM 10/12/2017    9:50 AM 08/11/2016   12:00 PM  MMSE - Mini Mental State Exam  Orientation to time 5 5 5 5 5   Orientation to Place 5 5 5 5 5   Registration 3 3 3 3 3   Attention/ Calculation 5 5 0 0 0  Recall 3 3 3 3 3   Language- name 2 objects   0 0 0  Language- repeat 1 1 1 1 1   Language- follow 3 step command   3 3 3   Language- read & follow direction   0 0 0  Write a sentence   0 0 0  Copy design   0 0 0  Total score   20 20 20         12/13/2023    9:03 AM 12/09/2022    9:27 AM  6CIT Screen  What Year? 0 points 0 points  What month? 0 points 0 points  What time? 0 points 0 points  Count back from 20 0 points 0 points  Months in reverse 0 points 0 points  Repeat phrase 0 points 0 points  Total Score 0 points 0 points    Immunizations Immunization History  Administered Date(s) Administered   Fluad Quad(high Dose 65+) 06/30/2019, 05/06/2020   Fluad Trivalent(High Dose 65+) 06/11/2023   Influenza, High Dose Seasonal PF 06/30/2021   Influenza,inj,Quad PF,6+ Mos 07/17/2013, 09/12/2016, 10/12/2017, 10/25/2018   Influenza-Unspecified 05/26/2021   PFIZER(Purple Top)SARS-COV-2 Vaccination 09/14/2019, 10/05/2019, 08/13/2020   Pneumococcal Conjugate-13 10/12/2017   Pneumococcal Polysaccharide-23 01/26/2008, 04/16/2010   Td 03/28/1998, 01/26/2008    Unspecified SARS-COV-2 Vaccination 05/26/2021    Screening Tests Health Maintenance  Topic Date Due   Zoster Vaccines- Shingrix (1 of 2) Never done   MAMMOGRAM  11/19/2023   DTaP/Tdap/Td (3 - Tdap) 06/10/2024 (Originally 01/25/2018)   COVID-19 Vaccine (5 - 2024-25 season) 06/26/2024 (Originally 04/25/2023)   INFLUENZA VACCINE  03/24/2024   Medicare Annual Wellness (AWV)  12/12/2024   Pneumonia Vaccine 45+ Years old  Completed   DEXA SCAN  Completed   HPV VACCINES  Aged Out   Meningococcal B Vaccine  Aged Out    Health Maintenance  Health Maintenance Due  Topic Date Due   Zoster Vaccines- Shingrix (1 of 2) Never done   MAMMOGRAM  11/19/2023   Health Maintenance Items Addressed:patient declined mammo and vaccinations.she will receive both when her daughters have the day off  Additional Screening:  Vision Screening: Recommended annual ophthalmology exams for early detection of glaucoma and other disorders of the eye.  Dental Screening: Recommended annual dental exams for proper oral hygiene  Community Resource Referral / Chronic Care Management: CRR required this visit?  No   CCM required this visit?  No     Plan:     I have personally reviewed and noted the following in the patient's chart:   Medical and social history Use of alcohol, tobacco or illicit drugs  Current medications and supplements including opioid prescriptions. Patient is not currently taking opioid prescriptions. Functional ability and status Nutritional status Physical activity Advanced directives List of other physicians Hospitalizations, surgeries, and ER visits in previous 12 months Vitals Screenings to include cognitive, depression, and falls Referrals and appointments  In addition, I have reviewed and  discussed with patient certain preventive protocols, quality metrics, and best practice recommendations. A written personalized care plan for preventive services as well as general preventive  health recommendations were provided to patient.     Freeda Jerry, [place]   12/13/2023   After Visit Summary: (MyChart) Due to this being a telephonic visit, the after visit summary with patients personalized plan was offered to patient via MyChart   Notes: Nothing significant to report at this time.

## 2023-12-13 NOTE — Patient Instructions (Signed)
 [name] [name] , Thank you for taking time to come for your Medicare Wellness Visit. I appreciate your ongoing commitment to your health goals. Please review the following plan we discussed and let me know if I can assist you in the future.   Referrals/Orders/Follow-Ups/Clinician Recommendations: follow up in 1 year for medicare annual wellness visit.  This is a list of the screening recommended for you and due dates:  Health Maintenance  Topic Date Due   Zoster (Shingles) Vaccine (1 of 2) Never done   Mammogram  11/19/2023   DTaP/Tdap/Td vaccine (3 - Tdap) 06/10/2024*   COVID-19 Vaccine (5 - 2024-25 season) 06/26/2024*   Flu Shot  03/24/2024   Medicare Annual Wellness Visit  12/12/2024   Pneumonia Vaccine  Completed   DEXA scan (bone density measurement)  Completed   HPV Vaccine  Aged Out   Meningitis B Vaccine  Aged Out  *Topic was postponed. The date shown is not the original due date.    Advanced directives: (Copy Requested) Please bring a copy of your health care power of attorney and living will to the office to be added to your chart at your convenience. You can mail to [facility] [address] [place], [place] [address] or email to [email or web address]  Next Medicare Annual Wellness Visit scheduled for next year: Yes

## 2023-12-14 DIAGNOSIS — Z96611 Presence of right artificial shoulder joint: Secondary | ICD-10-CM | POA: Diagnosis not present

## 2023-12-16 ENCOUNTER — Other Ambulatory Visit

## 2023-12-16 ENCOUNTER — Other Ambulatory Visit: Payer: Medicare Other

## 2023-12-20 ENCOUNTER — Other Ambulatory Visit (INDEPENDENT_AMBULATORY_CARE_PROVIDER_SITE_OTHER)

## 2023-12-20 DIAGNOSIS — Z79899 Other long term (current) drug therapy: Secondary | ICD-10-CM

## 2023-12-20 DIAGNOSIS — E785 Hyperlipidemia, unspecified: Secondary | ICD-10-CM

## 2023-12-20 DIAGNOSIS — I10 Essential (primary) hypertension: Secondary | ICD-10-CM

## 2023-12-20 DIAGNOSIS — E559 Vitamin D deficiency, unspecified: Secondary | ICD-10-CM | POA: Diagnosis not present

## 2023-12-20 DIAGNOSIS — R7309 Other abnormal glucose: Secondary | ICD-10-CM

## 2023-12-20 LAB — LIPID PANEL
Cholesterol: 187 mg/dL (ref 0–200)
HDL: 44.4 mg/dL (ref >39.00)
LDL Cholesterol: 100 mg/dL — ABNORMAL HIGH (ref 0–99)
NonHDL: 142.22
Total CHOL/HDL Ratio: 4
Triglycerides: 212 mg/dL — ABNORMAL HIGH (ref 0.0–149.0)
VLDL: 42.4 mg/dL — ABNORMAL HIGH (ref 0.0–40.0)

## 2023-12-20 LAB — COMPREHENSIVE METABOLIC PANEL WITH GFR
ALT: 12 U/L (ref 0–35)
AST: 18 U/L (ref 0–37)
Albumin: 4 g/dL (ref 3.5–5.2)
Alkaline Phosphatase: 63 U/L (ref 39–117)
BUN: 19 mg/dL (ref 6–23)
CO2: 26 meq/L (ref 19–32)
Calcium: 9.3 mg/dL (ref 8.4–10.5)
Chloride: 106 meq/L (ref 96–112)
Creatinine, Ser: 0.95 mg/dL (ref 0.40–1.20)
GFR: 55.09 mL/min — ABNORMAL LOW (ref >60.00)
Glucose, Bld: 99 mg/dL (ref 70–99)
Potassium: 4.6 meq/L (ref 3.5–5.1)
Sodium: 140 meq/L (ref 135–145)
Total Bilirubin: 0.4 mg/dL (ref 0.2–1.2)
Total Protein: 6.9 g/dL (ref 6.0–8.3)

## 2023-12-20 LAB — TSH: TSH: 2.62 u[IU]/mL (ref 0.35–5.50)

## 2023-12-20 LAB — CBC WITH DIFFERENTIAL/PLATELET
Basophils Absolute: 0 10*3/uL (ref 0.0–0.1)
Basophils Relative: 0.8 % (ref 0.0–3.0)
Eosinophils Absolute: 0.3 10*3/uL (ref 0.0–0.7)
Eosinophils Relative: 4 % (ref 0.0–5.0)
HCT: 34.4 % — ABNORMAL LOW (ref 36.0–46.0)
Hemoglobin: 10.9 g/dL — ABNORMAL LOW (ref 12.0–15.0)
Lymphocytes Relative: 31.3 % (ref 12.0–46.0)
Lymphs Abs: 2 10*3/uL (ref 0.7–4.0)
MCHC: 31.8 g/dL (ref 30.0–36.0)
MCV: 86.1 fl (ref 78.0–100.0)
Monocytes Absolute: 0.4 10*3/uL (ref 0.1–1.0)
Monocytes Relative: 6.9 % (ref 3.0–12.0)
Neutro Abs: 3.6 10*3/uL (ref 1.4–7.7)
Neutrophils Relative %: 57 % (ref 43.0–77.0)
Platelets: 218 10*3/uL (ref 150.0–400.0)
RBC: 4 Mil/uL (ref 3.87–5.11)
RDW: 15.6 % — ABNORMAL HIGH (ref 11.5–15.5)
WBC: 6.4 10*3/uL (ref 4.0–10.5)

## 2023-12-20 LAB — VITAMIN B12: Vitamin B-12: 442 pg/mL (ref 211–911)

## 2023-12-20 LAB — VITAMIN D 25 HYDROXY (VIT D DEFICIENCY, FRACTURES): VITD: 88.23 ng/mL (ref 30.00–100.00)

## 2023-12-20 LAB — HEMOGLOBIN A1C: Hgb A1c MFr Bld: 6 % (ref 4.6–6.5)

## 2023-12-23 ENCOUNTER — Encounter: Payer: Self-pay | Admitting: Family Medicine

## 2023-12-23 ENCOUNTER — Ambulatory Visit (INDEPENDENT_AMBULATORY_CARE_PROVIDER_SITE_OTHER): Payer: Medicare Other | Admitting: Family Medicine

## 2023-12-23 VITALS — BP 124/68 | HR 66 | Temp 98.0°F | Ht 62.0 in | Wt 173.2 lb

## 2023-12-23 DIAGNOSIS — K219 Gastro-esophageal reflux disease without esophagitis: Secondary | ICD-10-CM

## 2023-12-23 DIAGNOSIS — M19019 Primary osteoarthritis, unspecified shoulder: Secondary | ICD-10-CM | POA: Diagnosis not present

## 2023-12-23 DIAGNOSIS — Z79899 Other long term (current) drug therapy: Secondary | ICD-10-CM

## 2023-12-23 DIAGNOSIS — E785 Hyperlipidemia, unspecified: Secondary | ICD-10-CM | POA: Diagnosis not present

## 2023-12-23 DIAGNOSIS — M353 Polymyalgia rheumatica: Secondary | ICD-10-CM

## 2023-12-23 DIAGNOSIS — R7303 Prediabetes: Secondary | ICD-10-CM | POA: Diagnosis not present

## 2023-12-23 DIAGNOSIS — F411 Generalized anxiety disorder: Secondary | ICD-10-CM

## 2023-12-23 DIAGNOSIS — I10 Essential (primary) hypertension: Secondary | ICD-10-CM

## 2023-12-23 DIAGNOSIS — E559 Vitamin D deficiency, unspecified: Secondary | ICD-10-CM | POA: Diagnosis not present

## 2023-12-23 DIAGNOSIS — N289 Disorder of kidney and ureter, unspecified: Secondary | ICD-10-CM

## 2023-12-23 DIAGNOSIS — Z0000 Encounter for general adult medical examination without abnormal findings: Secondary | ICD-10-CM

## 2023-12-23 DIAGNOSIS — D638 Anemia in other chronic diseases classified elsewhere: Secondary | ICD-10-CM | POA: Diagnosis not present

## 2023-12-23 MED ORDER — BUSPIRONE HCL 15 MG PO TABS: 15.0000 mg | ORAL_TABLET | Freq: Every day | ORAL | 3 refills | Status: AC

## 2023-12-23 MED ORDER — SERTRALINE HCL 25 MG PO TABS: 25.0000 mg | ORAL_TABLET | Freq: Every day | ORAL | 3 refills | Status: AC

## 2023-12-23 NOTE — Assessment & Plan Note (Signed)
 Last vitamin D  Lab Results  Component Value Date   VD25OH 88.23 12/20/2023   Vitamin D  level is therapeutic with current supplementation Disc importance of this to bone and overall health

## 2023-12-23 NOTE — Assessment & Plan Note (Signed)
 Continues protonix  40 mg daily  Controlling symptoms

## 2023-12-23 NOTE — Assessment & Plan Note (Signed)
 Last hb is improved at 10.9 Under herme care Taking retacrit 

## 2023-12-23 NOTE — Assessment & Plan Note (Signed)
 GFR of 55.09  Encouraged better fluid intake if not on a restriction No nsaids

## 2023-12-23 NOTE — Assessment & Plan Note (Signed)
 Disc goals for lipids and reasons to control them Rev last labs with pt Rev low sat fat diet in detail   LDL of 100- up  Atorvastatin  40 mg daily   Under cardiology care

## 2023-12-23 NOTE — Assessment & Plan Note (Signed)
 Did well overall with 2nd shoulder replaceent

## 2023-12-23 NOTE — Assessment & Plan Note (Signed)
 No clinical changes No prednisone  currently

## 2023-12-23 NOTE — Progress Notes (Signed)
 Subjective:    Patient ID: [name] [name], female    DOB: [birth year], 83 y.o.   MRN: [id number]  HPI  Here for health maintenance exam and to review chronic medical problems   Wt Readings from Last 3 Encounters:  12/23/23 173 lb 4 oz (78.6 kg)  12/13/23 170 lb 11.2 oz (77.4 kg)  11/02/23 174 lb 12.8 oz (79.3 kg)   31.69 kg/m  Vitals:   12/23/23 0953  BP: 124/68  Pulse: 66  Temp: 98 F (36.7 C)  SpO2: 98%    Immunization History  Administered Date(s) Administered   Fluad Quad(high Dose 65+) 06/30/2019, 05/06/2020   Fluad Trivalent(High Dose 65+) 06/11/2023   Influenza, High Dose Seasonal PF 06/30/2021   Influenza,inj,Quad PF,6+ Mos 07/17/2013, 09/12/2016, 10/12/2017, 10/25/2018   Influenza-Unspecified 05/26/2021   PFIZER(Purple Top)SARS-COV-2 Vaccination 09/14/2019, 10/05/2019, 08/13/2020   Pneumococcal Conjugate-13 10/12/2017   Pneumococcal Polysaccharide-23 01/26/2008, 04/16/2010   Td 03/28/1998, 01/26/2008   Unspecified SARS-COV-2 Vaccination 05/26/2021    Health Maintenance Due  Topic Date Due   MAMMOGRAM  11/19/2023   Had recent shoulder replacement    Shingrix vaccine -interested in the future when she can   Mammogram 10/2022 at [facility]  -it is scheduled in the next week Self breast exam-no lumps   Gyn health No problems  Colon cancer screening -out aged   Bone health  Dexa 10/2022 osteopenia  Falls- has fallen several times this year with no fractures   Has a walker if she needs it  Fractures: none  Supplements  Last vitamin D  Lab Results  Component Value Date   VD25OH 88.23 12/20/2023    Exercise : Some walking  Uses a cane full time  Not in PT currently  but doing it at home      Mood PHQ and GAD scores are up     12/23/2023   10:49 AM 12/13/2023    9:16 AM 12/09/2022    9:22 AM 03/11/2022   11:52 AM 12/03/2021    9:09 AM  Depression screen PHQ 2/9  Decreased Interest 1 0 0 0 0  Down, Depressed, Hopeless 2 2 0 0 0  PHQ - 2 Score  3 2 0 0 0  Altered sleeping 2 0     Tired, decreased energy 2 0     Change in appetite 2 0     Feeling bad or failure about yourself  0 0     Trouble concentrating 1 0     Moving slowly or fidgety/restless 0 0     Suicidal thoughts 0 0     PHQ-9 Score 10 2     Difficult doing work/chores Somewhat difficult Not difficult at all         12/23/2023   10:50 AM  GAD 7 : Generalized Anxiety Score  Nervous, Anxious, on Edge 1  Control/stop worrying 3  Worry too much - different things 2  Trouble relaxing 1  Restless 0  Easily annoyed or irritable 0  Afraid - awful might happen 0  Total GAD 7 Score 7  Anxiety Difficulty Somewhat difficult      Low dose zoloft  25 mg daily for GAD/dep Buspar  15 mg daily   A lot of family medical problems  She worries about family  A lot going on and holidays were tough on her  Declines any help or medicine changes    HTN bp is stable today  No cp or palpitations or headaches or edema  No side  effects to medicines  BP Readings from Last 3 Encounters:  12/23/23 124/68  12/13/23 (!) 179/69  11/02/23 (!) 179/69    Pulse Readings from Last 3 Encounters:  12/23/23 66  11/02/23 78  10/23/23 73   Metoprolol  xl 25 mg daily  Losartan  100 mg daily   In setting of CAD/under cardiology care  Plavix  75 mg daily   Lab Results  Component Value Date   NA 140 12/20/2023   K 4.6 12/20/2023   CO2 26 12/20/2023   GLUCOSE 99 12/20/2023   BUN 19 12/20/2023   CREATININE 0.95 12/20/2023   CALCIUM  9.3 12/20/2023   GFR 55.09 (L) 12/20/2023   EGFR 59 (L) 12/10/2014   GFRNONAA >60 10/23/2023  Trying to drink more water   Lab Results  Component Value Date   ALT 12 12/20/2023   AST 18 12/20/2023   ALKPHOS 63 12/20/2023   BILITOT 0.4 12/20/2023   Anemia of chronic disease (prior infx/chronic inflammation, CKD) Also surgery in feb  Watched by hematology  Taking retacrit    Hb is improved  Lab Results  Component Value Date   WBC 6.4 12/20/2023    HGB 10.9 (L) 12/20/2023   HCT 34.4 (L) 12/20/2023   MCV 86.1 12/20/2023   PLT 218.0 12/20/2023     GERD with past PUD Protonix  40 mg daily  Lab Results  Component Value Date   VITAMINB12 442 12/20/2023   DM screen Lab Results  Component Value Date   HGBA1C 6.0 12/20/2023   HGBA1C 5.6 06/11/2023   HGBA1C 5.7 12/17/2022   Occational biscuit  Some fruit    Lab Results  Component Value Date   TSH 2.62 12/20/2023    Hyperlipidemia Lab Results  Component Value Date   CHOL 187 12/20/2023   CHOL 149 12/23/2022   CHOL 153 12/17/2022   Lab Results  Component Value Date   HDL 44.40 12/20/2023   HDL 42 12/23/2022   HDL 46.90 12/17/2022   Lab Results  Component Value Date   LDLCALC 100 (H) 12/20/2023   LDLCALC 76 12/23/2022   LDLCALC 78 12/17/2022   Lab Results  Component Value Date   TRIG 212.0 (H) 12/20/2023   TRIG 154 (H) 12/23/2022   TRIG 144.0 12/17/2022   Lab Results  Component Value Date   CHOLHDL 4 12/20/2023   CHOLHDL 3.5 12/23/2022   CHOLHDL 3 12/17/2022   Lab Results  Component Value Date   LDLDIRECT 75.0 12/02/2021   LDLDIRECT 35.0 10/19/2018   LDLDIRECT 74.0 10/12/2017   Atorvastatin  40 mg daily     Patient Active Problem List   Diagnosis Date Noted   S/P shoulder replacement, right 10/22/2023   Chronic cough 05/12/2023   History of TIA (transient ischemic attack) 12/23/2022   Asymptomatic bacteriuria 12/23/2022   Prediabetes 12/14/2022   Coronary artery disease involving native heart without angina pectoris 05/02/2021   Current use of proton pump inhibitor 10/29/2020   History of transient ischemic attack (TIA) 06/04/2020   History of femur fracture 02/04/2020   Renal insufficiency 11/01/2019   Pedal edema 10/04/2018   S/P shoulder replacement, left 12/17/2017   S/P reverse total shoulder arthroplasty, left 12/17/2017   Routine general medical examination at a health care facility 10/14/2017   Preop cardiovascular exam 10/01/2017    CAD S/P percutaneous coronary angioplasty 01/27/2017   Aortic regurgitation 01/27/2017   Dyspnea on exertion 08/19/2016   Left bundle branch block (LBBB) on electrocardiogram 08/19/2016   Estrogen deficiency 10/08/2015   Chronic fatigue  08/20/2015   PMR (polymyalgia rheumatica) (HCC) 07/17/2013   Intertrigo 04/19/2013   Obesity (BMI 30-39.9) 04/05/2013   S/P right THA, AA 04/04/2013   IBS (irritable bowel syndrome) 05/30/2012   Anemia of chronic disease 04/16/2010   Generalized anxiety disorder 04/16/2010   GERD 03/04/2009   Vitamin D  deficiency 03/12/2008   URINARY TRACT INFECTION, RECURRENT 08/07/2007   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   FIBROCYSTIC BREAST DISEASE, HX OF 02/11/2007   Hyperlipidemia with target low density lipoprotein (LDL) cholesterol less than 70 mg/dL 62/13/0865   DEPRESSION 01/10/2007   RETINAL VEIN OCCLUSION 01/10/2007   Essential hypertension 01/10/2007   PEPTIC ULCER DISEASE 01/10/2007   Shoulder arthritis 01/10/2007   SKIN CANCER, HX OF 01/10/2007   Localized, primary osteoarthritis of shoulder region 01/10/2007   Past Medical History:  Diagnosis Date   Anemia    of chronic disease   Anxiety    Arthritis    "psoratic arthritis; new dx" (09/11/2016)   CAD S/P percutaneous coronary angioplasty 08/2016   mLCx 90% -> 0% (2.75 mm x 16 mm) Synergy DES PCI Ostial D1 70% (not PTCA target). Moderate ostial and mD2.  LAD tandem 50% stenoses w/ negative FFR (0.86)   Cancer (HCC)    skin cancer on leg   Chronic lower back pain    Complication of anesthesia 1980 and 1982   trouble waking up after breast biopsy, many surgeries since no problem   Depression    Dyspnea    Dyspnea on effort - CHRONIC    partial relief of Sx post PCI of L Circumflex.   Fibrocystic breast    GERD (gastroesophageal reflux disease)    Headache    "monthly" (09/11/2016)   History of blood transfusion    "several since age 58; I've had them after most of my surgeries"  (09/11/2016)   History of myocardial infarction     - Myoview  [name] 2018 indicated small Inferior - Inferoseptal Infarct (vs. rest ischemia). Echo with inferoseptal hypokinesis.   Hx of skin cancer, basal cell 2013 and 2012   right inner, lower leg; several scattered around my body   Hypertension    Iron deficiency anemia    "I've had an iron infusion"   Osteoarthritis    Osteopenia    Peptic ulcer disease    H pylori   PMR (polymyalgia rheumatica) (HCC)    Pneumonia    PONV (postoperative nausea and vomiting)    Psoriasis    Stroke (HCC)    hx of mini stroke   TIA (transient ischemic attack) 06/03/2020   Symptoms of right arm weakness/numbness and slurred speech as well as right droopm => MRI brain/MRA head normal.  Normal echo normal carotid Dopplers.   Past Surgical History:  Procedure Laterality Date   APPENDECTOMY     BACK SURGERY     x 6    BREAST BIOPSY Left 1980 and 1982   "benign"   CARDIAC CATHETERIZATION N/A 09/11/2016   Procedure: Left Heart Cath and Coronary Angiography;  Surgeon: Arleen Lacer, MD;  Location: [facility] INVASIVE CV LAB;  Service: Cardiovascular: mLCx 90% * (PCI). Ostial D1 70% (not PTCA target). Mod ostial and mD2.  LAD tandem 50% stenoses w/ negative FFR (0.86)   CARDIAC CATHETERIZATION N/A 09/11/2016   Procedure: Intravascular Pressure Wire/FFR Study;  Surgeon: Arleen Lacer, MD;  Location: [facility] INVASIVE CV LAB;  Service: Cardiovascular: --  LAD tandem 50% stenoses w/ negative FFR (0.86   CARDIAC CATHETERIZATION N/A 09/11/2016  Procedure: Coronary Stent Intervention;  Surgeon: Arleen Lacer, MD;  Location: [facility] INVASIVE CV LAB;  Service: Cardiovascular: -- DES PCI mLCx 90%--0% SYNERGY DES 2.75 MM X 16 MM.   CARDIAC CATHETERIZATION  2001   non-obstructive CAD   CATARACT EXTRACTION W/ INTRAOCULAR LENS  IMPLANT, BILATERAL  July -  August 2017   DILATION AND CURETTAGE OF UTERUS  X 3   ESOPHAGOGASTRODUODENOSCOPY  03/2009   ulcer,HP, GERD stricture    EXCISIONAL HEMORRHOIDECTOMY     HIP ARTHROPLASTY Left 2010   JOINT REPLACEMENT     KNEE ARTHROSCOPY Bilateral    LAPAROSCOPIC CHOLECYSTECTOMY  2005   LUMBAR DISC SURGERY  11/1984; 1987; 1989; 2004; 2005 X 2   deg. disk lumber spine    MOHS SURGERY Right    "inside my lower leg"   NASAL SINUS SURGERY  1970s   NM MYOVIEW  LTD  08/28/2016   INTERMEDIATE RISK (b/c reduced EF & ? small infarct) -- EF ~35-40%.  ? Prior Small Inferior-apical & septal infarct (w/o ischemia).  Difuse HK - worse in inferoapex.  Suggest prior infarct with no ischemia. Ischemic area correlates with echo WMA   NM MYOVIEW  LTD  05/27/2021   Findings consistent with prior anteroapical infarct with fixed defect.  No peri-infarct ischemia.  Notable improvement from prior study.:   ORIF FEMUR FRACTURE Right 01/04/2020   Procedure: OPEN REDUCTION INTERNAL FIXATION (ORIF) DISTAL FEMUR FRACTURE;  Surgeon: Adonica Hoose, MD;  Location: [facility] OR;  Service: Orthopedics;  Laterality: Right;   REVERSE SHOULDER ARTHROPLASTY Left 12/17/2017   Procedure: LEFT REVERSE SHOULDER ARTHROPLASTY;  Surgeon: Winston Hawking, MD;  Location: [facility] OR;  Service: Orthopedics;  Laterality: Left;   REVERSE SHOULDER ARTHROPLASTY Right 10/22/2023   Procedure: REVERSE SHOULDER ARTHROPLASTY;  Surgeon: Winston Hawking, MD;  Location: [facility] ORS;  Service: Orthopedics;  Laterality: Right;  interscale block   TOTAL HIP ARTHROPLASTY Right 04/04/2013   Procedure: RIGHT TOTAL HIP ARTHROPLASTY ANTERIOR APPROACH;  Surgeon: Bevin Bucks, MD;  Location: [facility] ORS;  Service: Orthopedics;  Laterality: Right;   TOTAL KNEE ARTHROPLASTY Right 2009   TRANSTHORACIC ECHOCARDIOGRAM  1/'18; 1/'19   a) 1/'18: mild conc LVH. EF 45-50% - anteroseptal & inferoseptal HK. Mod AI.; b) 1/'19 - post PCI: (post PCI) --> EF normalized:  Normal LV systolic function; mild diastolic dysfunction; mild   LVH; sclerotic aortic valve with mild AI.  No RWMA   TRANSTHORACIC ECHOCARDIOGRAM  06/04/2020    for  TIA: Normal LV size and function.  No or WMA.  EF 60 to 65%.  GR 1 DD with elevated LAP.  Normal RV size and function.  Normal RVP, RAP.  Mild aortic valve calcification-sclerosis but no stenosis.  Trivial AI.  No embolic source   TUBAL LIGATION     VAGINAL HYSTERECTOMY  1970s   partial    Social History   Tobacco Use   Smoking status: Never    Passive exposure: Never   Smokeless tobacco: Never  Vaping Use   Vaping status: Never Used  Substance Use Topics   Alcohol use: Not Currently   Drug use: No   Family History  Problem Relation Age of Onset   Arthritis Mother    Emphysema Mother    Hyperlipidemia Mother    Hypertension Mother    Heart disease Mother    Heart disease Brother        CAD   Diabetes Paternal Grandmother    Heart disease Maternal Aunt    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   Ace Inhibitors Cough   Hydrochlorothiazide      Hyponatremia    Oxaprozin     GI upset    Pregabalin Swelling    SWELLING REACTION UNSPECIFIED    Amoxicillin-Pot Clavulanate Nausea And Vomiting    gi upset   Current Outpatient Medications on File Prior to Visit  Medication Sig Dispense Refill   acetaminophen  (TYLENOL ) 500 MG tablet Take 1,000 mg by mouth 2 (two) times daily as needed for moderate pain (pain score 4-6) or headache.     atorvastatin  (LIPITOR) 40 MG tablet TAKE 1 TABLET BY MOUTH EVERY DAY 90 tablet 2   benzonatate  (TESSALON ) 200 MG capsule TAKE 1 CAPSULE (200 MG TOTAL) BY MOUTH 3 (THREE) TIMES DAILY AS NEEDED FOR COUGH. 90 capsule 2   cetirizine  (ZYRTEC ) 10 MG tablet TAKE 1 TABLET BY MOUTH EVERY DAY 90 tablet 1   chlorhexidine  (HIBICLENS ) 4 % external liquid Apply 15 mLs (1 Application total) topically as directed for 30 doses. Use as directed daily for 5 days every other week for 6 weeks. 946 mL 1   Cholecalciferol  (VITAMIN D ) 50 MCG (2000 UT) tablet Take 2,000 Units by mouth 2 (two) times  daily.     clopidogrel  (PLAVIX ) 75 MG tablet TAKE 1 TABLET BY MOUTH EVERY DAY 90 tablet 2   fluticasone  (FLONASE ) 50 MCG/ACT nasal spray PLACE 1 SPRAY INTO BOTH NOSTRILS DAILY AS NEEDED. 48 mL 1   HYDROcodone -acetaminophen  (NORCO) 7.5-325 MG tablet Take 1 tablet by mouth 3 (three) times daily as needed for severe pain (pain score 7-10).     losartan  (COZAAR ) 100 MG tablet Take 1 tablet (100 mg) by mouth once daily in the morning 90 tablet 3   Melatonin 3 MG CAPS Take 6 mg by mouth at bedtime.      methocarbamol  (ROBAXIN ) 500 MG tablet TAKE 1 TABLET BY MOUTH EVERY 8 HOURS AS NEEDED FOR MUSCLE SPASMS 90 tablet 2   metoprolol  succinate (TOPROL  XL) 25 MG 24 hr tablet Take 1 tablet (25 mg) by mouth once daily at bedtime 90 tablet 3   nitroGLYCERIN  (NITROSTAT ) 0.4 MG SL tablet Place 1 tablet (0.4 mg total) under the tongue every 5 (five) minutes as needed. 25 tablet 3   nystatin  (MYCOSTATIN /NYSTOP ) powder Apply topically 2 (two) times daily as needed. To affected areas to prevent yeast (in skin folds) 30 g 1   pantoprazole  (PROTONIX ) 40 MG tablet TAKE 1 TABLET BY MOUTH EVERY DAY 90 tablet 2   No current facility-administered medications on file prior to visit.    Review of Systems  Constitutional:  Negative for activity change, appetite change, fatigue, fever and unexpected weight change.  HENT:  Negative for congestion, ear pain, rhinorrhea, sinus pressure and sore throat.   Eyes:  Negative for pain, redness and visual disturbance.  Respiratory:  Negative for cough, shortness of breath and wheezing.   Cardiovascular:  Negative for chest pain and palpitations.  Gastrointestinal:  Negative for abdominal pain, blood in stool, constipation and diarrhea.  Endocrine: Negative for polydipsia and polyuria.  Genitourinary:  Negative for dysuria, frequency and urgency.  Musculoskeletal:  Positive for arthralgias. Negative for back pain and myalgias.  Skin:  Negative for pallor and rash.   Allergic/Immunologic: Negative for environmental allergies.  Neurological:  Negative for dizziness, syncope and headaches.  Hematological:  Negative for adenopathy. Does not bruise/bleed easily.  Psychiatric/Behavioral:  Negative for decreased  concentration and dysphoric mood. The patient is nervous/anxious.        Objective:   Physical Exam Constitutional:      General: She is not in acute distress.    Appearance: Normal appearance. She is well-developed. She is obese. She is not ill-appearing or diaphoretic.  HENT:     Head: Normocephalic and atraumatic.     Right Ear: Tympanic membrane, ear canal and external ear normal.     Left Ear: Tympanic membrane, ear canal and external ear normal.     Nose: Nose normal. No congestion.     Mouth/Throat:     Mouth: Mucous membranes are moist.     Pharynx: Oropharynx is clear. No posterior oropharyngeal erythema.  Eyes:     General: No scleral icterus.    Extraocular Movements: Extraocular movements intact.     Conjunctiva/sclera: Conjunctivae normal.     Pupils: Pupils are equal, round, and reactive to light.  Neck:     Thyroid : No thyromegaly.     Vascular: No carotid bruit or JVD.  Cardiovascular:     Rate and Rhythm: Normal rate and regular rhythm.     Pulses: Normal pulses.     Heart sounds: Normal heart sounds.     No gallop.  Pulmonary:     Effort: Pulmonary effort is normal. No respiratory distress.     Breath sounds: Normal breath sounds. No wheezing.     Comments: Good air exch Chest:     Chest wall: No tenderness.  Abdominal:     General: Bowel sounds are normal. There is no distension or abdominal bruit.     Palpations: Abdomen is soft. There is no mass.     Tenderness: There is no abdominal tenderness.     Hernia: No hernia is present.  Genitourinary:    Comments: Pt declined breast exam Musculoskeletal:        General: No tenderness. Normal range of motion.     Cervical back: Normal range of motion and neck  supple. No rigidity. No muscular tenderness.     Right lower leg: Edema present.     Left lower leg: Edema present.     Comments: Mild/borderline kyphosis   Trace ankle edema   Lymphadenopathy:     Cervical: No cervical adenopathy.  Skin:    General: Skin is warm and dry.     Coloration: Skin is not pale.     Findings: No erythema or rash.     Comments: Fair   Solar lentigines diffusely Few sks   Right arm- hyperkeratotic area noted (pt states she has follow up with derm for this upcoming)  Neurological:     Mental Status: She is alert. Mental status is at baseline.     Cranial Nerves: No cranial nerve deficit.     Motor: No abnormal muscle tone.     Coordination: Coordination normal.     Gait: Gait normal.     Deep Tendon Reflexes: Reflexes are normal and symmetric. Reflexes normal.  Psychiatric:        Mood and Affect: Mood normal.        Speech: Speech is tangential.        Cognition and Memory: Cognition and memory normal.           Assessment & Plan:   Problem List Items Addressed This Visit       Cardiovascular and Mediastinum   Essential hypertension - Primary (Chronic)   bp in fair control at this  time  BP Readings from Last 1 Encounters:  12/23/23 124/68   No changes needed Most recent labs reviewed  Disc lifstyle change with low sodium diet and exercise   Under care of cardiology for this  and CAD Metoprolol  xl 25 mg daily -tolerating Losartan  100 mg daily   Also plavix           Digestive   GERD   Continues protonix  40 mg daily  Controlling symptoms         Musculoskeletal and Integument   Shoulder arthritis   Did well overall with 2nd shoulder replaceent         Genitourinary   Renal insufficiency   GFR of 55.09  Encouraged better fluid intake if not on a restriction No nsaids         Other   Hyperlipidemia with target low density lipoprotein (LDL) cholesterol less than 70 mg/dL (Chronic)   Disc goals for lipids and reasons  to control them Rev last labs with pt Rev low sat fat diet in detail   LDL of 100- up  Atorvastatin  40 mg daily   Under cardiology care         Anemia of chronic disease (Chronic)   Last hb is improved at 10.9 Under herme care Taking retacrit        Vitamin D  deficiency   Last vitamin D  Lab Results  Component Value Date   VD25OH 88.23 12/20/2023   Vitamin D  level is therapeutic with current supplementation Disc importance of this to bone and overall health       Routine general medical examination at a health care facility   Reviewed health habits including diet and exercise and skin cancer prevention Reviewed appropriate screening tests for age  Also reviewed health mt list, fam hx and immunization status , as well as social and family history   See HPI Labs reviewed and ordered Health Maintenance  Topic Date Due   Mammogram  11/19/2023   DTaP/Tdap/Td vaccine (3 - Tdap) 06/10/2024*   COVID-19 Vaccine (5 - 2024-25 season) 06/26/2024*   Zoster (Shingles) Vaccine (1 of 2) 03/24/2025*   Flu Shot  03/24/2024   Medicare Annual Wellness Visit  12/12/2024   Pneumonia Vaccine  Completed   DEXA scan (bone density measurement)  Completed   HPV Vaccine  Aged Out   Meningitis B Vaccine  Aged Out  *Topic was postponed. The date shown is not the original due date.    Mammogram scheduled next wk Discussed fall prevention, supplements and exercise for bone density  Encouraged use of walker instead of fall for fall prevention  Encouraged more fluids fo rrenal health  Phq and GAD7 up -pt declines further treatment or counseling  Mood is good today       Prediabetes   Lab Results  Component Value Date   HGBA1C 6.0 12/20/2023   HGBA1C 5.6 06/11/2023   HGBA1C 5.7 12/17/2022   disc imp of low glycemic diet and wt loss to prevent DM2       PMR (polymyalgia rheumatica) (HCC)   No clinical changes No prednisone  currently      Generalized anxiety disorder   Relevant  Medications   busPIRone  (BUSPAR ) 15 MG tablet   sertraline  (ZOLOFT ) 25 MG tablet   Current use of proton pump inhibitor   Last vitamin D  Lab Results  Component Value Date   VD25OH 88.23 12/20/2023   Lab Results  Component Value Date   VITAMINB12 442 12/20/2023

## 2023-12-23 NOTE — Assessment & Plan Note (Signed)
 Last vitamin D  Lab Results  Component Value Date   VD25OH 88.23 12/20/2023   Lab Results  Component Value Date   VITAMINB12 442 12/20/2023

## 2023-12-23 NOTE — Assessment & Plan Note (Signed)
 bp in fair control at this time  BP Readings from Last 1 Encounters:  12/23/23 124/68   No changes needed Most recent labs reviewed  Disc lifstyle change with low sodium diet and exercise   Under care of cardiology for this  and CAD Metoprolol  xl 25 mg daily -tolerating Losartan  100 mg daily   Also plavix 

## 2023-12-23 NOTE — Assessment & Plan Note (Addendum)
 Reviewed health habits including diet and exercise and skin cancer prevention Reviewed appropriate screening tests for age  Also reviewed health mt list, fam hx and immunization status , as well as social and family history   See HPI Labs reviewed and ordered Health Maintenance  Topic Date Due   Mammogram  11/19/2023   DTaP/Tdap/Td vaccine (3 - Tdap) 06/10/2024*   COVID-19 Vaccine (5 - 2024-25 season) 06/26/2024*   Zoster (Shingles) Vaccine (1 of 2) 03/24/2025*   Flu Shot  03/24/2024   Medicare Annual Wellness Visit  12/12/2024   Pneumonia Vaccine  Completed   DEXA scan (bone density measurement)  Completed   HPV Vaccine  Aged Out   Meningitis B Vaccine  Aged Out  *Topic was postponed. The date shown is not the original due date.    Mammogram scheduled next wk Discussed fall prevention, supplements and exercise for bone density  Encouraged use of walker instead of fall for fall prevention  Encouraged more fluids fo rrenal health  Phq and GAD7 up -pt declines further treatment or counseling  Mood is good today

## 2023-12-23 NOTE — Assessment & Plan Note (Signed)
 Lab Results  Component Value Date   HGBA1C 6.0 12/20/2023   HGBA1C 5.6 06/11/2023   HGBA1C 5.7 12/17/2022   disc imp of low glycemic diet and wt loss to prevent DM2

## 2023-12-23 NOTE — Patient Instructions (Addendum)
 If you are interested in the new shingles vaccine (Shingrix) - call your local pharmacy to check on coverage and availability    [name] [name] is for pain  [name] [name] is for balance-use it when you need it   Stay as active as you can be  Add some strength training to your routine, this is important for bone and brain health and can reduce your risk of falls and help your body use insulin properly and regulate weight  Light weights, exercise bands , and internet videos are a good way to start  Yoga (chair or regular), machines , floor exercises or a gym with machines are also good options   Think about working on the core  Continue home PT for shoulders   You need more fluids for kidney health (unless cardiologist limits this)   To prevent diabetes  Eliminate added sugar when possible Try to get most of your carbohydrates from produce (with the exception of white potatoes) and whole grains Eat less bread/pasta/rice/snack foods/cereals/sweets and other items from the middle of the grocery store (processed carbs)  Cholesterol is up  Avoid red meat/ fried foods/ egg yolks/ fatty breakfast meats/ butter, cheese and high fat dairy/ and shellfish

## 2023-12-28 DIAGNOSIS — Z1231 Encounter for screening mammogram for malignant neoplasm of breast: Secondary | ICD-10-CM | POA: Diagnosis not present

## 2023-12-28 LAB — HM MAMMOGRAPHY

## 2023-12-29 ENCOUNTER — Other Ambulatory Visit: Payer: Self-pay | Admitting: Family Medicine

## 2023-12-29 DIAGNOSIS — J302 Other seasonal allergic rhinitis: Secondary | ICD-10-CM

## 2024-01-13 ENCOUNTER — Emergency Department (HOSPITAL_COMMUNITY)

## 2024-01-13 ENCOUNTER — Inpatient Hospital Stay (HOSPITAL_COMMUNITY)
Admission: EM | Admit: 2024-01-13 | Discharge: 2024-01-23 | DRG: 083 | Disposition: E | Attending: Surgery | Admitting: Surgery

## 2024-01-13 DIAGNOSIS — J9811 Atelectasis: Secondary | ICD-10-CM | POA: Diagnosis not present

## 2024-01-13 DIAGNOSIS — R404 Transient alteration of awareness: Secondary | ICD-10-CM | POA: Diagnosis not present

## 2024-01-13 DIAGNOSIS — S0101XA Laceration without foreign body of scalp, initial encounter: Secondary | ICD-10-CM | POA: Diagnosis not present

## 2024-01-13 DIAGNOSIS — G8929 Other chronic pain: Secondary | ICD-10-CM | POA: Diagnosis present

## 2024-01-13 DIAGNOSIS — Z8673 Personal history of transient ischemic attack (TIA), and cerebral infarction without residual deficits: Secondary | ICD-10-CM | POA: Diagnosis not present

## 2024-01-13 DIAGNOSIS — F32A Depression, unspecified: Secondary | ICD-10-CM | POA: Diagnosis present

## 2024-01-13 DIAGNOSIS — G9389 Other specified disorders of brain: Secondary | ICD-10-CM | POA: Diagnosis not present

## 2024-01-13 DIAGNOSIS — M353 Polymyalgia rheumatica: Secondary | ICD-10-CM | POA: Diagnosis present

## 2024-01-13 DIAGNOSIS — Z66 Do not resuscitate: Secondary | ICD-10-CM | POA: Diagnosis present

## 2024-01-13 DIAGNOSIS — Z96612 Presence of left artificial shoulder joint: Secondary | ICD-10-CM | POA: Diagnosis not present

## 2024-01-13 DIAGNOSIS — S3991XA Unspecified injury of abdomen, initial encounter: Secondary | ICD-10-CM | POA: Diagnosis not present

## 2024-01-13 DIAGNOSIS — Z8711 Personal history of peptic ulcer disease: Secondary | ICD-10-CM

## 2024-01-13 DIAGNOSIS — S3993XA Unspecified injury of pelvis, initial encounter: Secondary | ICD-10-CM | POA: Diagnosis not present

## 2024-01-13 DIAGNOSIS — M545 Low back pain, unspecified: Secondary | ICD-10-CM | POA: Diagnosis present

## 2024-01-13 DIAGNOSIS — S01111A Laceration without foreign body of right eyelid and periocular area, initial encounter: Secondary | ICD-10-CM | POA: Diagnosis not present

## 2024-01-13 DIAGNOSIS — W010XXA Fall on same level from slipping, tripping and stumbling without subsequent striking against object, initial encounter: Secondary | ICD-10-CM | POA: Diagnosis present

## 2024-01-13 DIAGNOSIS — D638 Anemia in other chronic diseases classified elsewhere: Secondary | ICD-10-CM | POA: Diagnosis not present

## 2024-01-13 DIAGNOSIS — R55 Syncope and collapse: Secondary | ICD-10-CM | POA: Diagnosis not present

## 2024-01-13 DIAGNOSIS — R0989 Other specified symptoms and signs involving the circulatory and respiratory systems: Secondary | ICD-10-CM | POA: Diagnosis not present

## 2024-01-13 DIAGNOSIS — R402342 Coma scale, best motor response, flexion withdrawal, at arrival to emergency department: Secondary | ICD-10-CM | POA: Diagnosis not present

## 2024-01-13 DIAGNOSIS — R402212 Coma scale, best verbal response, none, at arrival to emergency department: Secondary | ICD-10-CM | POA: Diagnosis present

## 2024-01-13 DIAGNOSIS — I252 Old myocardial infarction: Secondary | ICD-10-CM

## 2024-01-13 DIAGNOSIS — Z96643 Presence of artificial hip joint, bilateral: Secondary | ICD-10-CM | POA: Diagnosis not present

## 2024-01-13 DIAGNOSIS — I2510 Atherosclerotic heart disease of native coronary artery without angina pectoris: Secondary | ICD-10-CM | POA: Diagnosis not present

## 2024-01-13 DIAGNOSIS — Z85828 Personal history of other malignant neoplasm of skin: Secondary | ICD-10-CM

## 2024-01-13 DIAGNOSIS — F419 Anxiety disorder, unspecified: Secondary | ICD-10-CM | POA: Diagnosis present

## 2024-01-13 DIAGNOSIS — D6832 Hemorrhagic disorder due to extrinsic circulating anticoagulants: Secondary | ICD-10-CM | POA: Diagnosis present

## 2024-01-13 DIAGNOSIS — S80211A Abrasion, right knee, initial encounter: Secondary | ICD-10-CM | POA: Diagnosis present

## 2024-01-13 DIAGNOSIS — Z515 Encounter for palliative care: Secondary | ICD-10-CM

## 2024-01-13 DIAGNOSIS — S065XAA Traumatic subdural hemorrhage with loss of consciousness status unknown, initial encounter: Secondary | ICD-10-CM | POA: Diagnosis not present

## 2024-01-13 DIAGNOSIS — S069XAA Unspecified intracranial injury with loss of consciousness status unknown, initial encounter: Secondary | ICD-10-CM | POA: Diagnosis present

## 2024-01-13 DIAGNOSIS — Z955 Presence of coronary angioplasty implant and graft: Secondary | ICD-10-CM | POA: Diagnosis not present

## 2024-01-13 DIAGNOSIS — K219 Gastro-esophageal reflux disease without esophagitis: Secondary | ICD-10-CM | POA: Diagnosis present

## 2024-01-13 DIAGNOSIS — I10 Essential (primary) hypertension: Secondary | ICD-10-CM | POA: Diagnosis not present

## 2024-01-13 DIAGNOSIS — R402112 Coma scale, eyes open, never, at arrival to emergency department: Secondary | ICD-10-CM | POA: Diagnosis present

## 2024-01-13 DIAGNOSIS — Z96651 Presence of right artificial knee joint: Secondary | ICD-10-CM | POA: Diagnosis present

## 2024-01-13 DIAGNOSIS — S065X0A Traumatic subdural hemorrhage without loss of consciousness, initial encounter: Secondary | ICD-10-CM | POA: Diagnosis not present

## 2024-01-13 DIAGNOSIS — Z4682 Encounter for fitting and adjustment of non-vascular catheter: Secondary | ICD-10-CM | POA: Diagnosis not present

## 2024-01-13 DIAGNOSIS — S299XXA Unspecified injury of thorax, initial encounter: Secondary | ICD-10-CM | POA: Diagnosis not present

## 2024-01-13 DIAGNOSIS — I499 Cardiac arrhythmia, unspecified: Secondary | ICD-10-CM | POA: Diagnosis not present

## 2024-01-13 LAB — I-STAT CHEM 8, ED
BUN: 31 mg/dL — ABNORMAL HIGH (ref 8–23)
Calcium, Ion: 1.16 mmol/L (ref 1.15–1.40)
Chloride: 109 mmol/L (ref 98–111)
Creatinine, Ser: 1.2 mg/dL — ABNORMAL HIGH (ref 0.44–1.00)
Glucose, Bld: 139 mg/dL — ABNORMAL HIGH (ref 70–99)
HCT: 29 % — ABNORMAL LOW (ref 36.0–46.0)
Hemoglobin: 9.9 g/dL — ABNORMAL LOW (ref 12.0–15.0)
Potassium: 4.2 mmol/L (ref 3.5–5.1)
Sodium: 139 mmol/L (ref 135–145)
TCO2: 20 mmol/L — ABNORMAL LOW (ref 22–32)

## 2024-01-13 LAB — CBC
HCT: 30.7 % — ABNORMAL LOW (ref 36.0–46.0)
Hemoglobin: 9.6 g/dL — ABNORMAL LOW (ref 12.0–15.0)
MCH: 28.1 pg (ref 26.0–34.0)
MCHC: 31.3 g/dL (ref 30.0–36.0)
MCV: 89.8 fL (ref 80.0–100.0)
Platelets: 284 10*3/uL (ref 150–400)
RBC: 3.42 MIL/uL — ABNORMAL LOW (ref 3.87–5.11)
RDW: 13.9 % (ref 11.5–15.5)
WBC: 12.4 10*3/uL — ABNORMAL HIGH (ref 4.0–10.5)
nRBC: 0 % (ref 0.0–0.2)

## 2024-01-13 LAB — PROTIME-INR
INR: 1 (ref 0.8–1.2)
Prothrombin Time: 13.3 s (ref 11.4–15.2)

## 2024-01-13 LAB — COMPREHENSIVE METABOLIC PANEL WITH GFR
ALT: 17 U/L (ref 0–44)
AST: 34 U/L (ref 15–41)
Albumin: 3.4 g/dL — ABNORMAL LOW (ref 3.5–5.0)
Alkaline Phosphatase: 52 U/L (ref 38–126)
Anion gap: 10 (ref 5–15)
BUN: 23 mg/dL (ref 8–23)
CO2: 19 mmol/L — ABNORMAL LOW (ref 22–32)
Calcium: 9.1 mg/dL (ref 8.9–10.3)
Chloride: 109 mmol/L (ref 98–111)
Creatinine, Ser: 1.16 mg/dL — ABNORMAL HIGH (ref 0.44–1.00)
GFR, Estimated: 46 mL/min — ABNORMAL LOW (ref >60)
Glucose, Bld: 140 mg/dL — ABNORMAL HIGH (ref 70–99)
Potassium: 4.1 mmol/L (ref 3.5–5.1)
Sodium: 138 mmol/L (ref 135–145)
Total Bilirubin: 0.7 mg/dL (ref 0.0–1.2)
Total Protein: 6.7 g/dL (ref 6.5–8.1)

## 2024-01-13 LAB — I-STAT CG4 LACTIC ACID, ED: Lactic Acid, Venous: 1.7 mmol/L (ref 0.5–1.9)

## 2024-01-13 LAB — ETHANOL: Alcohol, Ethyl (B): <15 mg/dL (ref <15)

## 2024-01-13 MED ORDER — PHENYLEPHRINE HCL-NACL 20-0.9 MG/250ML-% IV SOLN
0.0000 ug/min | INTRAVENOUS | Status: DC
Start: 2024-01-13 — End: 2024-01-14

## 2024-01-13 MED ORDER — PROPOFOL 1000 MG/100ML IV EMUL
0.0000 ug/kg/min | INTRAVENOUS | Status: DC
  Administered 2024-01-13: 20 ug/kg/min via INTRAVENOUS

## 2024-01-13 MED ORDER — PROPOFOL BOLUS VIA INFUSION
50.0000 mg | Freq: Once | INTRAVENOUS | Status: AC
  Administered 2024-01-13: 50 mg via INTRAVENOUS
  Filled 2024-01-13: qty 50

## 2024-01-13 MED ORDER — SUCCINYLCHOLINE CHLORIDE 20 MG/ML IJ SOLN
INTRAMUSCULAR | Status: AC | PRN
  Administered 2024-01-13: 100 mg via INTRAVENOUS

## 2024-01-13 MED ORDER — ETOMIDATE 2 MG/ML IV SOLN
INTRAVENOUS | Status: AC | PRN
  Administered 2024-01-13: 20 mg via INTRAVENOUS

## 2024-01-13 MED ORDER — SODIUM CHLORIDE 0.9 % IV SOLN
INTRAVENOUS | Status: AC | PRN
  Administered 2024-01-13: 1000 mL via INTRAVENOUS

## 2024-01-13 MED ORDER — TETANUS-DIPHTH-ACELL PERTUSSIS 5-2.5-18.5 LF-MCG/0.5 IM SUSY: 0.5000 mL | PREFILLED_SYRINGE | Freq: Once | INTRAMUSCULAR | Status: DC

## 2024-01-13 MED ORDER — NOREPINEPHRINE 4 MG/250ML-% IV SOLN: 0.0000 ug/min | INTRAVENOUS | Status: DC

## 2024-01-13 MED ORDER — FENTANYL CITRATE PF 50 MCG/ML IJ SOSY
PREFILLED_SYRINGE | INTRAMUSCULAR | Status: AC
  Filled 2024-01-13: qty 1

## 2024-01-13 MED ORDER — IOHEXOL 350 MG/ML SOLN
75.0000 mL | Freq: Once | INTRAVENOUS | Status: AC | PRN
  Administered 2024-01-13: 75 mL via INTRAVENOUS

## 2024-01-13 MED ORDER — FENTANYL CITRATE PF 50 MCG/ML IJ SOSY
50.0000 ug | PREFILLED_SYRINGE | Freq: Once | INTRAMUSCULAR | Status: AC
  Administered 2024-01-13: 50 ug via INTRAVENOUS

## 2024-01-13 MED ORDER — PHENYLEPHRINE HCL-NACL 20-0.9 MG/250ML-% IV SOLN
INTRAVENOUS | Status: AC
  Administered 2024-01-13: 400 ug/min via INTRAVENOUS
  Filled 2024-01-13: qty 250

## 2024-01-13 NOTE — ED Notes (Signed)
 Family at bedside with pt.

## 2024-01-13 NOTE — Progress Notes (Signed)
 RT transported pt from TRA-B to CT-1 and back with TRN and RN at bedside. No complications at this time.

## 2024-01-13 NOTE — Progress Notes (Signed)
 Chaplain responded to Lvl 1 trauma. Pt had already arrived by the time chaplain made it to ED. Chaplain learned from EMS that "at least two family members are on the way." Chaplain checked with security to learn if any family had arrived yet. No family at hospital yet. Chaplain requested security to contact chaplain once family begins arriving.

## 2024-01-13 NOTE — Consult Note (Signed)
 Reason for Consult:acute right subdural hematoma Referring Physician: Maurisa, Tesmer is an 83 y.o. female.  HPI: whom fell in her home striking her head on the mantle of her chimney. Initially talking and responsive her mental status declined rapidly. Intubated emergently in the [facility] ED. Head CT showed a large Acute subdural hematoma with significant mass effect and midline shift  No past medical history on file.  No family history on file.  Social History:  has no history on file for tobacco use, alcohol use, and drug use.  Allergies: Not on File  Medications: I have reviewed the patient's current medications.  Results for orders placed or performed during the hospital encounter of 01/13/24 (from the past 48 hours)  CBC     Status: Abnormal   Collection Time: 01/13/24  9:07 PM  Result Value Ref Range   WBC 12.4 (H) 4.0 - 10.5 K/uL   RBC 3.42 (L) 3.87 - 5.11 MIL/uL   Hemoglobin 9.6 (L) 12.0 - 15.0 g/dL   HCT 86.5 (L) 78.4 - 69.6 %   MCV 89.8 80.0 - 100.0 fL   MCH 28.1 26.0 - 34.0 pg   MCHC 31.3 30.0 - 36.0 g/dL   RDW 29.5 28.4 - 13.2 %   Platelets 284 150 - 400 K/uL   nRBC 0.0 0.0 - 0.2 %    Comment: Performed at [facility], [address] [place], [place] [address]  Ethanol     Status: None   Collection Time: 01/13/24  9:07 PM  Result Value Ref Range   Alcohol, Ethyl (B) <15 <15 mg/dL    Comment: (NOTE) For medical purposes only. Performed at [facility], [address], [place], [place] [address]   Protime-INR     Status: None   Collection Time: 01/13/24  9:07 PM  Result Value Ref Range   Prothrombin Time 13.3 11.4 - 15.2 seconds   INR 1.0 0.8 - 1.2    Comment: (NOTE) INR goal varies based on device and disease states. Performed at [facility], [address] [place], [place] [address]   I-Stat Chem 8, ED     Status: Abnormal   Collection Time: 01/13/24  9:16 PM  Result Value Ref Range   Sodium 139 135 - 145 mmol/L    Potassium 4.2 3.5 - 5.1 mmol/L   Chloride 109 98 - 111 mmol/L   BUN 31 (H) 8 - 23 mg/dL   Creatinine, Ser 3.47 (H) 0.44 - 1.00 mg/dL   Glucose, Bld 425 (H) 70 - 99 mg/dL    Comment: Glucose reference range applies only to samples taken after fasting for at least 8 hours.   Calcium , Ion 1.16 1.15 - 1.40 mmol/L   TCO2 20 (L) 22 - 32 mmol/L   Hemoglobin 9.9 (L) 12.0 - 15.0 g/dL   HCT 95.6 (L) 38.7 - 56.4 %  I-Stat Lactic Acid, ED     Status: None   Collection Time: 01/13/24  9:17 PM  Result Value Ref Range   Lactic Acid, Venous 1.7 0.5 - 1.9 mmol/L    CT HEAD WO CONTRAST Result Date: 01/13/2024 CLINICAL DATA:  Ground level fall on Plavix  EXAM: CT HEAD WITHOUT CONTRAST CT MAXILLOFACIAL WITHOUT CONTRAST CT CERVICAL SPINE WITHOUT CONTRAST TECHNIQUE: Multidetector CT imaging of the head, cervical spine, and maxillofacial structures were performed using the standard protocol without intravenous contrast. Multiplanar CT image reconstructions of the cervical spine and maxillofacial structures were also generated. RADIATION  DOSE REDUCTION: This exam was performed according to the departmental dose-optimization program which includes automated exposure control, adjustment of the mA and/or kV according to patient size and/or use of iterative reconstruction technique. COMPARISON:  None Available. FINDINGS: CT HEAD FINDINGS Brain: Large right-sided acute subdural hematoma, measures up to 2.1 cm maximum thickness on coronal series 5, image 48. Mass effect on underlying brain parenchyma, about 13 mm midline shift to the left. Compression of the right lateral and third ventricles with slight asymmetrical enlargement of the left atrium of the lateral ventricle. Small volume acute left convexity subdural hematoma, measures 5 mm maximum thickness on series 5, image 50. Trace posterior inter hemispheric subdural blood, coronal series 5, image 22 and small volume blood layering along the right tentorium, coronal series 5,  image 20. Vascular: No hyperdense vessels.  Carotid vascular calcification Skull: No convincing skull fracture Other: Moderate right periorbital and forehead hematoma. Moderate right posterior parietal scalp hematoma and laceration. Traumatic Brain Injury Risk Stratification Skull Fracture: No - Low/mBIG 1 Subdural Hematoma (SDH): 8mm plus - High/mBIG 3 Subarachnoid Hemorrhage [facility]): No Epidural Hematoma (EDH): No - Low/mBIG 1 Cerebral contusion, intra-axial, intraparenchymal Hemorrhage (IPH): No Intraventricular Hemorrhage (IVH): No - Low/mBIG 1 Midline Shift > 1mm or Edema/effacement of sulci/vents: Yes - High/mBIG 3 ---------------------------------------------------- CT MAXILLOFACIAL FINDINGS Osseous: Mastoid air cells are clear. Mandibular heads are normally position. No mandibular fracture. Pterygoid plates and zygomatic arches appear intact. No acute nasal bone fracture. Orbits: Negative. No traumatic or inflammatory finding. Sinuses: Moderate mucosal thickening in the sinuses. Fluid and debris in the posterior nasopharynx. No acute sinus wall fracture Soft tissues: Moderate right periorbital and forehead scalp hematoma. Moderate large right posterior parietal scalp laceration and hematoma CT CERVICAL SPINE FINDINGS Alignment: No subluxation.  Facet alignment is normal Skull base and vertebrae: No acute fracture. No primary bone lesion or focal pathologic process. Soft tissues and spinal canal: Fluid in the posterior nasopharynx. No visible canal hematoma Disc levels: C1-C2 degenerative changes. Partial ankylosis C4-C5. Moderate disc space narrowing C6-C7. Multilevel facet degenerative change with foraminal narrowing. Upper chest: See separately dictated chest CT Other: None IMPRESSION: 1. Large acute right-sided subdural hematoma, measures up to 2.1 cm maximum thickness with mass effect on the underlying brain parenchyma and approximately 13 mm midline shift to the left. Mass effect on the right lateral and  third ventricles with slight asymmetrical enlargement of the left atria of the lateral ventricle. Small volume acute left convexity subdural hematoma, maximum 5 mm thickness, with trace posterior inter hemispheric subdural blood and small volume blood layering along the right tentorium. 2. No CT evidence for acute facial bone fracture. Moderate right periorbital and forehead scalp hematoma. Moderate to large right posterior parietal scalp laceration and hematoma. 3. No CT evidence for acute osseous abnormality of the cervical spine. Degenerative changes. Critical Value/emergent results were called by telephone at the time of interpretation on 01/13/2024 at 9:55 Pm to provider Dr. Elvan Hamel, Who verbally acknowledged these results. Electronically Signed   By: Esmeralda Hedge M.D.   On: 01/13/2024 22:18   CT MAXILLOFACIAL WO CONTRAST Result Date: 01/13/2024 CLINICAL DATA:  Ground level fall on Plavix  EXAM: CT HEAD WITHOUT CONTRAST CT MAXILLOFACIAL WITHOUT CONTRAST CT CERVICAL SPINE WITHOUT CONTRAST TECHNIQUE: Multidetector CT imaging of the head, cervical spine, and maxillofacial structures were performed using the standard protocol without intravenous contrast. Multiplanar CT image reconstructions of the cervical spine and maxillofacial structures were also generated. RADIATION DOSE REDUCTION: This exam was performed according to  the departmental dose-optimization program which includes automated exposure control, adjustment of the mA and/or kV according to patient size and/or use of iterative reconstruction technique. COMPARISON:  None Available. FINDINGS: CT HEAD FINDINGS Brain: Large right-sided acute subdural hematoma, measures up to 2.1 cm maximum thickness on coronal series 5, image 48. Mass effect on underlying brain parenchyma, about 13 mm midline shift to the left. Compression of the right lateral and third ventricles with slight asymmetrical enlargement of the left atrium of the lateral ventricle. Small volume  acute left convexity subdural hematoma, measures 5 mm maximum thickness on series 5, image 50. Trace posterior inter hemispheric subdural blood, coronal series 5, image 22 and small volume blood layering along the right tentorium, coronal series 5, image 20. Vascular: No hyperdense vessels.  Carotid vascular calcification Skull: No convincing skull fracture Other: Moderate right periorbital and forehead hematoma. Moderate right posterior parietal scalp hematoma and laceration. Traumatic Brain Injury Risk Stratification Skull Fracture: No - Low/mBIG 1 Subdural Hematoma (SDH): 8mm plus - High/mBIG 3 Subarachnoid Hemorrhage [facility]): No Epidural Hematoma (EDH): No - Low/mBIG 1 Cerebral contusion, intra-axial, intraparenchymal Hemorrhage (IPH): No Intraventricular Hemorrhage (IVH): No - Low/mBIG 1 Midline Shift > 1mm or Edema/effacement of sulci/vents: Yes - High/mBIG 3 ---------------------------------------------------- CT MAXILLOFACIAL FINDINGS Osseous: Mastoid air cells are clear. Mandibular heads are normally position. No mandibular fracture. Pterygoid plates and zygomatic arches appear intact. No acute nasal bone fracture. Orbits: Negative. No traumatic or inflammatory finding. Sinuses: Moderate mucosal thickening in the sinuses. Fluid and debris in the posterior nasopharynx. No acute sinus wall fracture Soft tissues: Moderate right periorbital and forehead scalp hematoma. Moderate large right posterior parietal scalp laceration and hematoma CT CERVICAL SPINE FINDINGS Alignment: No subluxation.  Facet alignment is normal Skull base and vertebrae: No acute fracture. No primary bone lesion or focal pathologic process. Soft tissues and spinal canal: Fluid in the posterior nasopharynx. No visible canal hematoma Disc levels: C1-C2 degenerative changes. Partial ankylosis C4-C5. Moderate disc space narrowing C6-C7. Multilevel facet degenerative change with foraminal narrowing. Upper chest: See separately dictated chest CT  Other: None IMPRESSION: 1. Large acute right-sided subdural hematoma, measures up to 2.1 cm maximum thickness with mass effect on the underlying brain parenchyma and approximately 13 mm midline shift to the left. Mass effect on the right lateral and third ventricles with slight asymmetrical enlargement of the left atria of the lateral ventricle. Small volume acute left convexity subdural hematoma, maximum 5 mm thickness, with trace posterior inter hemispheric subdural blood and small volume blood layering along the right tentorium. 2. No CT evidence for acute facial bone fracture. Moderate right periorbital and forehead scalp hematoma. Moderate to large right posterior parietal scalp laceration and hematoma. 3. No CT evidence for acute osseous abnormality of the cervical spine. Degenerative changes. Critical Value/emergent results were called by telephone at the time of interpretation on 01/13/2024 at 9:55 Pm to provider Dr. Elvan Hamel, Who verbally acknowledged these results. Electronically Signed   By: Esmeralda Hedge M.D.   On: 01/13/2024 22:18   CT CERVICAL SPINE WO CONTRAST Result Date: 01/13/2024 CLINICAL DATA:  Ground level fall on Plavix  EXAM: CT HEAD WITHOUT CONTRAST CT MAXILLOFACIAL WITHOUT CONTRAST CT CERVICAL SPINE WITHOUT CONTRAST TECHNIQUE: Multidetector CT imaging of the head, cervical spine, and maxillofacial structures were performed using the standard protocol without intravenous contrast. Multiplanar CT image reconstructions of the cervical spine and maxillofacial structures were also generated. RADIATION DOSE REDUCTION: This exam was performed according to the departmental dose-optimization program which includes automated  exposure control, adjustment of the mA and/or kV according to patient size and/or use of iterative reconstruction technique. COMPARISON:  None Available. FINDINGS: CT HEAD FINDINGS Brain: Large right-sided acute subdural hematoma, measures up to 2.1 cm maximum thickness on coronal  series 5, image 48. Mass effect on underlying brain parenchyma, about 13 mm midline shift to the left. Compression of the right lateral and third ventricles with slight asymmetrical enlargement of the left atrium of the lateral ventricle. Small volume acute left convexity subdural hematoma, measures 5 mm maximum thickness on series 5, image 50. Trace posterior inter hemispheric subdural blood, coronal series 5, image 22 and small volume blood layering along the right tentorium, coronal series 5, image 20. Vascular: No hyperdense vessels.  Carotid vascular calcification Skull: No convincing skull fracture Other: Moderate right periorbital and forehead hematoma. Moderate right posterior parietal scalp hematoma and laceration. Traumatic Brain Injury Risk Stratification Skull Fracture: No - Low/mBIG 1 Subdural Hematoma (SDH): 8mm plus - High/mBIG 3 Subarachnoid Hemorrhage [facility]): No Epidural Hematoma (EDH): No - Low/mBIG 1 Cerebral contusion, intra-axial, intraparenchymal Hemorrhage (IPH): No Intraventricular Hemorrhage (IVH): No - Low/mBIG 1 Midline Shift > 1mm or Edema/effacement of sulci/vents: Yes - High/mBIG 3 ---------------------------------------------------- CT MAXILLOFACIAL FINDINGS Osseous: Mastoid air cells are clear. Mandibular heads are normally position. No mandibular fracture. Pterygoid plates and zygomatic arches appear intact. No acute nasal bone fracture. Orbits: Negative. No traumatic or inflammatory finding. Sinuses: Moderate mucosal thickening in the sinuses. Fluid and debris in the posterior nasopharynx. No acute sinus wall fracture Soft tissues: Moderate right periorbital and forehead scalp hematoma. Moderate large right posterior parietal scalp laceration and hematoma CT CERVICAL SPINE FINDINGS Alignment: No subluxation.  Facet alignment is normal Skull base and vertebrae: No acute fracture. No primary bone lesion or focal pathologic process. Soft tissues and spinal canal: Fluid in the posterior  nasopharynx. No visible canal hematoma Disc levels: C1-C2 degenerative changes. Partial ankylosis C4-C5. Moderate disc space narrowing C6-C7. Multilevel facet degenerative change with foraminal narrowing. Upper chest: See separately dictated chest CT Other: None IMPRESSION: 1. Large acute right-sided subdural hematoma, measures up to 2.1 cm maximum thickness with mass effect on the underlying brain parenchyma and approximately 13 mm midline shift to the left. Mass effect on the right lateral and third ventricles with slight asymmetrical enlargement of the left atria of the lateral ventricle. Small volume acute left convexity subdural hematoma, maximum 5 mm thickness, with trace posterior inter hemispheric subdural blood and small volume blood layering along the right tentorium. 2. No CT evidence for acute facial bone fracture. Moderate right periorbital and forehead scalp hematoma. Moderate to large right posterior parietal scalp laceration and hematoma. 3. No CT evidence for acute osseous abnormality of the cervical spine. Degenerative changes. Critical Value/emergent results were called by telephone at the time of interpretation on 01/13/2024 at 9:55 Pm to provider Dr. Elvan Hamel, Who verbally acknowledged these results. Electronically Signed   By: Esmeralda Hedge M.D.   On: 01/13/2024 22:18   DG Pelvis Portable Result Date: 01/13/2024 CLINICAL DATA:  Status post trauma. 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 A radiopaque fixation plate and screws are also seen along the proximal right femoral shaft. There is no evidence of an acute pelvic fracture or diastasis. No pelvic bone lesions are seen. IMPRESSION: 1. Intact bilateral total hip replacements. 2. No acute fracture or dislocation. Electronically Signed   By: Virgle Grime M.D.   On: 01/13/2024  22:15   DG Chest Port 1 View Result Date: 01/13/2024 CLINICAL DATA:  Status post  trauma. EXAM: PORTABLE CHEST 1 VIEW COMPARISON:  December 22, 2022 FINDINGS: An endotracheal tube is seen with its distal tip approximately 4.3 cm from the carina. An enteric tube is in place with its distal tip overlying the body of the stomach. The distal side hole sits approximately 3.1 cm distal to the expected region of the gastroesophageal junction. The heart size and mediastinal contours are within normal limits. Low lung volumes are noted. Mild atelectasis is suspected within the left lung base. There is no evidence of acute infiltrate, pleural effusion or pneumothorax. Radiopaque surgical clips are seen within the right upper quadrant. There is evidence of prior bilateral shoulder arthroplasty. Multilevel degenerative changes are present throughout the thoracic spine. IMPRESSION: 1. Endotracheal tube and enteric tube positioning, as described above. 2. Low lung volumes with mild left basilar atelectasis. Electronically Signed   By: Virgle Grime M.D.   On: 01/13/2024 22:09    Review of Systems  Unable to perform ROS: Patient unresponsive   Blood pressure (!) 230/108, pulse (!) 45, temperature (!) 95.8 F (35.4 C), temperature source Temporal, resp. rate 18, weight 75 kg, SpO2 100%. Physical Exam Neurological:     Mental Status: She is unresponsive.     GCS: GCS eye subscore is 1. GCS motor subscore is 1.   Intubated Not responsive to noxious stimuli Pupils not reactive to light No cough no gag Currently pulse is slowing and blood pressure is decreasing  Assessment/Plan: [name] [name] is a 83 y.o. female With acute trauma to the head. I do not believe she will survive the injury. Family notified questions answered. No operation will be done. She is DNR  Audie Bleacher 01/13/2024, 10:20 PM

## 2024-01-13 NOTE — ED Notes (Signed)
 Neur surg consult

## 2024-01-13 NOTE — ED Triage Notes (Signed)
 Level 1 Trauma Ground Level Fall on Plavix    EMS reports pt was sitting on a recliner reaching over to put on or remove shoes when she feel forward striking the right side of his face on a fire place. Presents to ED c-collar in place, laceration to right side of face bandaged. Mumbling on scene now unresponsive. Arrival GCS 6. IV 18G LAC, 4 iv Zofran  for 1 episode of emesis.

## 2024-01-13 NOTE — H&P (Signed)
 CC: unable to obtain  Requesting provider: n/a  HPI: [name] [name] is an 83 y.o. female who is here for evaluation as a ground-level fall as a level 1 trauma alert.  It was a witnessed fall.  The patiently fell and struck her head on a fireplace.  She was brought directly from the scene.  Patient has a history of hypertension, coronary artery disease, TIA on Plavix .  She had a significant scalp laceration which was bandaged at the scene.  The patient has been nonverbal since transport.  The patient did vomit in transport per EMS.  She was given some Zofran .   Pmhx Htn Tia on plavix  CAD Anemia of chronic dis  Past Medical History:  Diagnosis Date   Anemia      of chronic disease   Anxiety     Arthritis      "psoratic arthritis; new dx" (09/11/2016)   CAD S/P percutaneous coronary angioplasty 08/2016    mLCx 90% -> 0% (2.75 mm x 16 mm) Synergy DES PCI Ostial D1 70% (not PTCA target). Moderate ostial and mD2.  LAD tandem 50% stenoses w/ negative FFR (0.86)   Cancer (HCC)      skin cancer on leg   Chronic lower back pain     Complication of anesthesia 1980 and 1982    trouble waking up after breast biopsy, many surgeries since no problem   Depression     Dyspnea on effort - CHRONIC      partial relief of Sx post PCI of L Circumflex.   Fibrocystic breast     GERD (gastroesophageal reflux disease)     Headache      "monthly" (09/11/2016)   History of blood transfusion      "several since age 89; I've had them after most of my surgeries" (09/11/2016)   History of myocardial infarction       - Myoview  [name] 2018 indicated small Inferior - Inferoseptal Infarct (vs. rest ischemia). Echo with inferoseptal hypokinesis.   Hx of skin cancer, basal cell 2013 and 2012    right inner, lower leg; several scattered around my body   Hypertension     Iron deficiency anemia      "I've had an iron infusion"   Osteoarthritis     Osteopenia     Peptic ulcer disease      H pylori   PMR  (polymyalgia rheumatica) (HCC)     PONV (postoperative nausea and vomiting)     Psoriasis     TIA (transient ischemic attack) 06/03/2020    Symptoms of right arm weakness/numbness and slurred speech as well as right droopm => MRI brain/MRA head normal.  Normal echo normal carotid Dopplers.        Past Surgical History:  Procedure Laterality Date   APPENDECTOMY       BACK SURGERY        x 6    BREAST BIOPSY Left 1980 and 1982    "benign"   CARDIAC CATHETERIZATION N/A 09/11/2016    Procedure: Left Heart Cath and Coronary Angiography;  Surgeon: Arleen Lacer, MD;  Location: [facility] INVASIVE CV LAB;  Service: Cardiovascular: mLCx 90% * (PCI). Ostial D1 70% (not PTCA target). Mod ostial and mD2.  LAD tandem 50% stenoses w/ negative FFR (0.86)   CARDIAC CATHETERIZATION N/A 09/11/2016    Procedure: Intravascular Pressure Wire/FFR Study;  Surgeon: Arleen Lacer, MD;  Location: [facility] INVASIVE CV LAB;  Service: Cardiovascular: --  LAD tandem 50%  stenoses w/ negative FFR (0.86   CARDIAC CATHETERIZATION N/A 09/11/2016    Procedure: Coronary Stent Intervention;  Surgeon: Arleen Lacer, MD;  Location: [facility] INVASIVE CV LAB;  Service: Cardiovascular: -- DES PCI mLCx 90%--0% SYNERGY DES 2.75 MM X 16 MM.   CARDIAC CATHETERIZATION   2001    non-obstructive CAD   CATARACT EXTRACTION W/ INTRAOCULAR LENS  IMPLANT, BILATERAL   July -  August 2017   DILATION AND CURETTAGE OF UTERUS   X 3   ESOPHAGOGASTRODUODENOSCOPY   03/2009    ulcer,HP, GERD stricture   EXCISIONAL HEMORRHOIDECTOMY       HIP ARTHROPLASTY Left 2010   JOINT REPLACEMENT       KNEE ARTHROSCOPY Bilateral     LAPAROSCOPIC CHOLECYSTECTOMY   2005   LUMBAR DISC SURGERY   11/1984; 1987; 1989; 2004; 2005 X 2    deg. disk lumber spine    MOHS SURGERY Right      "inside my lower leg"   NASAL SINUS SURGERY   1970s   NM MYOVIEW  LTD   08/28/2016    INTERMEDIATE RISK (b/c reduced EF & ? small infarct) -- EF ~35-40%.  ? Prior Small Inferior-apical & septal  infarct (w/o ischemia).  Difuse HK - worse in inferoapex.  Suggest prior infarct with no ischemia. Ischemic area correlates with echo WMA   NM MYOVIEW  LTD   05/27/2021    Findings consistent with prior anteroapical infarct with fixed defect.  No peri-infarct ischemia.  Notable improvement from prior study.:   ORIF FEMUR FRACTURE Right 01/04/2020    Procedure: OPEN REDUCTION INTERNAL FIXATION (ORIF) DISTAL FEMUR FRACTURE;  Surgeon: Adonica Hoose, MD;  Location: [facility] OR;  Service: Orthopedics;  Laterality: Right;   REVERSE SHOULDER ARTHROPLASTY Left 12/17/2017    Procedure: LEFT REVERSE SHOULDER ARTHROPLASTY;  Surgeon: Winston Hawking, MD;  Location: [facility] OR;  Service: Orthopedics;  Laterality: Left;   TOTAL HIP ARTHROPLASTY Right 04/04/2013    Procedure: RIGHT TOTAL HIP ARTHROPLASTY ANTERIOR APPROACH;  Surgeon: Bevin Bucks, MD;  Location: [facility] ORS;  Service: Orthopedics;  Laterality: Right;   TOTAL KNEE ARTHROPLASTY Right 2009   TRANSTHORACIC ECHOCARDIOGRAM   1/'18; 1/'19    a) 1/'18: mild conc LVH. EF 45-50% - anteroseptal & inferoseptal HK. Mod AI.; b) 1/'19 - post PCI: (post PCI) --> EF normalized:  Normal LV systolic function; mild diastolic dysfunction; mild   LVH; sclerotic aortic valve with mild AI.  No RWMA   TRANSTHORACIC ECHOCARDIOGRAM   06/04/2020     for TIA: Normal LV size and function.  No or WMA.  EF 60 to 65%.  GR 1 DD with elevated LAP.  Normal RV size and function.  Normal RVP, RAP.  Mild aortic valve calcification-sclerosis but no stenosis.  Trivial AI.  No embolic source   TUBAL LIGATION       VAGINAL HYSTERECTOMY   1970s    partial     No family history on file.  Social:  has no history on file for tobacco use, alcohol use, and drug use.  Allergies: Not on File  Medications: Medication list from her other chart was reviewed   ROS -unable to perform due to mental status  PE Blood pressure (!) 199/71, pulse (!) 48, temperature (!) 95.8 F (35.4 C), temperature source  Temporal, resp. rate 14, weight 75 kg, SpO2 100%. Constitutional: obtunded, bleeding from Rt scalp; no deformities Eyes: Moist conjunctiva; no lid lag; anicteric; PERRL, 2mm; Rt eye periorbital ecchymosis Head: complex scalp  lac Rt Neck: Trachea midline; no thyromegaly; C collar Lungs: Normal respiratory effort; no tactile fremitus CV: RRR; no palpable thrills; no pitting edema GI: Abd soft, nd; no palpable hepatosplenomegaly MSK:  no clubbing/cyanosis; Rt knee abrasion/contusion Psychiatric: unable to assess Lymphatic: No palpable cervical or axillary lymphadenopathy Skin:complex Rt scalp lac;  Neuro: GCS 6 E2, V1, M3  Results for orders placed or performed during the hospital encounter of 01/13/24 (from the past 48 hours)  Comprehensive metabolic panel     Status: Abnormal   Collection Time: 01/13/24  9:07 PM  Result Value Ref Range   Sodium 138 135 - 145 mmol/L   Potassium 4.1 3.5 - 5.1 mmol/L    Comment: HEMOLYSIS AT THIS LEVEL MAY AFFECT RESULT   Chloride 109 98 - 111 mmol/L   CO2 19 (L) 22 - 32 mmol/L   Glucose, Bld 140 (H) 70 - 99 mg/dL    Comment: Glucose reference range applies only to samples taken after fasting for at least 8 hours.   BUN 23 8 - 23 mg/dL   Creatinine, Ser 4.03 (H) 0.44 - 1.00 mg/dL   Calcium  9.1 8.9 - 10.3 mg/dL   Total Protein 6.7 6.5 - 8.1 g/dL   Albumin 3.4 (L) 3.5 - 5.0 g/dL   AST 34 15 - 41 U/L    Comment: HEMOLYSIS AT THIS LEVEL MAY AFFECT RESULT   ALT 17 0 - 44 U/L    Comment: HEMOLYSIS AT THIS LEVEL MAY AFFECT RESULT   Alkaline Phosphatase 52 38 - 126 U/L   Total Bilirubin 0.7 0.0 - 1.2 mg/dL    Comment: HEMOLYSIS AT THIS LEVEL MAY AFFECT RESULT   GFR, Estimated 46 (L) >60 mL/min    Comment: (NOTE) Calculated using the CKD-EPI Creatinine Equation (2021)    Anion gap 10 5 - 15    Comment: Performed at [facility], [address] [place], [place] [address]  CBC     Status: Abnormal   Collection Time: 01/13/24  9:07 PM  Result  Value Ref Range   WBC 12.4 (H) 4.0 - 10.5 K/uL   RBC 3.42 (L) 3.87 - 5.11 MIL/uL   Hemoglobin 9.6 (L) 12.0 - 15.0 g/dL   HCT 95.6 (L) 38.7 - 56.4 %   MCV 89.8 80.0 - 100.0 fL   MCH 28.1 26.0 - 34.0 pg   MCHC 31.3 30.0 - 36.0 g/dL   RDW 33.2 95.1 - 88.4 %   Platelets 284 150 - 400 K/uL   nRBC 0.0 0.0 - 0.2 %    Comment: Performed at [facility], [address], [place], [place] [address]  Ethanol     Status: None   Collection Time: 01/13/24  9:07 PM  Result Value Ref Range   Alcohol, Ethyl (B) <15 <15 mg/dL    Comment: (NOTE) For medical purposes only. Performed at [facility], [address] [place], [place] [address]   Protime-INR     Status: None   Collection Time: 01/13/24  9:07 PM  Result Value Ref Range   Prothrombin Time 13.3 11.4 - 15.2 seconds   INR 1.0 0.8 - 1.2    Comment: (NOTE) INR goal varies based on device and disease states. Performed at [facility], [address] [place], [place] [address]   I-Stat Chem 8, ED     Status: Abnormal   Collection Time: 01/13/24  9:16 PM  Result Value Ref Range   Sodium 139 135 -  145 mmol/L   Potassium 4.2 3.5 - 5.1 mmol/L   Chloride 109 98 - 111 mmol/L   BUN 31 (H) 8 - 23 mg/dL   Creatinine, Ser 1.61 (H) 0.44 - 1.00 mg/dL   Glucose, Bld 096 (H) 70 - 99 mg/dL    Comment: Glucose reference range applies only to samples taken after fasting for at least 8 hours.   Calcium , Ion 1.16 1.15 - 1.40 mmol/L   TCO2 20 (L) 22 - 32 mmol/L   Hemoglobin 9.9 (L) 12.0 - 15.0 g/dL   HCT 04.5 (L) 40.9 - 81.1 %  I-Stat Lactic Acid, ED     Status: None   Collection Time: 01/13/24  9:17 PM  Result Value Ref Range   Lactic Acid, Venous 1.7 0.5 - 1.9 mmol/L    CT CHEST ABDOMEN PELVIS W CONTRAST Result Date: 01/13/2024 CLINICAL DATA:  Trauma EXAM: CT CHEST, ABDOMEN, AND PELVIS WITH CONTRAST TECHNIQUE: Multidetector CT imaging of the chest, abdomen and pelvis was performed following the standard protocol during bolus  administration of intravenous contrast. RADIATION DOSE REDUCTION: This exam was performed according to the departmental dose-optimization program which includes automated exposure control, adjustment of the mA and/or kV according to patient size and/or use of iterative reconstruction technique. CONTRAST:  75mL OMNIPAQUE  IOHEXOL  350 MG/ML SOLN COMPARISON:  CT of the chest 06/19/2019 CT stone 09/06/2007 FINDINGS: CT CHEST FINDINGS Cardiovascular: The heart is enlarged. Aorta is normal in size. There is no pericardial effusion. There are atherosclerotic calcifications of the aorta and coronary arteries. Mediastinum/Nodes: Enteric tube is seen throughout the soft a Gus. There is a small hiatal hernia. There is a small amount of fluid throughout the entire esophagus. Visualized thyroid  gland is within normal limits. No enlarged lymph nodes are identified. No evidence for pneumomediastinum or mediastinal hematoma. Lungs/Pleura: Endotracheal tube tip 1.4 cm above the carina. There are ground-glass and airspace opacities in the dependent portions of the inferior bilateral lower lobes, left greater than right. The lungs are otherwise clear. There is no pleural effusion or pneumothorax identified. Musculoskeletal: Bilateral shoulder arthroplasties are present. No acute fractures are seen. CT ABDOMEN PELVIS FINDINGS Hepatobiliary: Patient is status post cholecystectomy. There is mild intra and extrahepatic biliary ductal dilatation. No focal liver lesion or subcapsular fluid collection. Pancreas: Unremarkable. No pancreatic ductal dilatation or surrounding inflammatory changes. Spleen: No splenic injury or perisplenic hematoma. Adrenals/Urinary Tract: Bladder not well evaluated secondary to streak artifact in the pelvis. There is no hydronephrosis or perinephric stranding. Renal cysts are present on the right measuring up to 3.6 cm. Adrenal glands are within normal limits. Stomach/Bowel: Stomach is within normal limits.  Enteric tube tip is in the body of the stomach. No evidence of bowel wall thickening, distention, or inflammatory changes. There are scattered colonic diverticula. The appendix is not seen. Vascular/Lymphatic: Aortic atherosclerosis. No enlarged abdominal or pelvic lymph nodes. Reproductive: Status post hysterectomy. No adnexal masses. Other: No abdominal wall hernia or abnormality. No abdominopelvic ascites. Musculoskeletal: No acute fractures are seen. Bilateral hip arthroplasties are present. Degenerative changes affect the spine. The IMPRESSION: 1. No acute posttraumatic sequelae in the chest, abdomen or pelvis. 2. Bibasilar ground-glass and airspace opacities, left greater than right, worrisome for aspiration or infection. 3. Cardiomegaly. 4. Small hiatal hernia with fluid throughout the esophagus compatible with gastroesophageal reflux. 5. Status post cholecystectomy with mild intra and extrahepatic biliary ductal dilatation. 6. Colonic diverticulosis. Aortic Atherosclerosis (ICD10-I70.0). Electronically Signed   By: Tyron Gallon M.D.   On: 01/13/2024  22:18   CT HEAD WO CONTRAST Result Date: 01/13/2024 CLINICAL DATA:  Ground level fall on Plavix  EXAM: CT HEAD WITHOUT CONTRAST CT MAXILLOFACIAL WITHOUT CONTRAST CT CERVICAL SPINE WITHOUT CONTRAST TECHNIQUE: Multidetector CT imaging of the head, cervical spine, and maxillofacial structures were performed using the standard protocol without intravenous contrast. Multiplanar CT image reconstructions of the cervical spine and maxillofacial structures were also generated. RADIATION DOSE REDUCTION: This exam was performed according to the departmental dose-optimization program which includes automated exposure control, adjustment of the mA and/or kV according to patient size and/or use of iterative reconstruction technique. COMPARISON:  None Available. FINDINGS: CT HEAD FINDINGS Brain: Large right-sided acute subdural hematoma, measures up to 2.1 cm maximum  thickness on coronal series 5, image 48. Mass effect on underlying brain parenchyma, about 13 mm midline shift to the left. Compression of the right lateral and third ventricles with slight asymmetrical enlargement of the left atrium of the lateral ventricle. Small volume acute left convexity subdural hematoma, measures 5 mm maximum thickness on series 5, image 50. Trace posterior inter hemispheric subdural blood, coronal series 5, image 22 and small volume blood layering along the right tentorium, coronal series 5, image 20. Vascular: No hyperdense vessels.  Carotid vascular calcification Skull: No convincing skull fracture Other: Moderate right periorbital and forehead hematoma. Moderate right posterior parietal scalp hematoma and laceration. Traumatic Brain Injury Risk Stratification Skull Fracture: No - Low/mBIG 1 Subdural Hematoma (SDH): 8mm plus - High/mBIG 3 Subarachnoid Hemorrhage [facility]): No Epidural Hematoma (EDH): No - Low/mBIG 1 Cerebral contusion, intra-axial, intraparenchymal Hemorrhage (IPH): No Intraventricular Hemorrhage (IVH): No - Low/mBIG 1 Midline Shift > 1mm or Edema/effacement of sulci/vents: Yes - High/mBIG 3 ---------------------------------------------------- CT MAXILLOFACIAL FINDINGS Osseous: Mastoid air cells are clear. Mandibular heads are normally position. No mandibular fracture. Pterygoid plates and zygomatic arches appear intact. No acute nasal bone fracture. Orbits: Negative. No traumatic or inflammatory finding. Sinuses: Moderate mucosal thickening in the sinuses. Fluid and debris in the posterior nasopharynx. No acute sinus wall fracture Soft tissues: Moderate right periorbital and forehead scalp hematoma. Moderate large right posterior parietal scalp laceration and hematoma CT CERVICAL SPINE FINDINGS Alignment: No subluxation.  Facet alignment is normal Skull base and vertebrae: No acute fracture. No primary bone lesion or focal pathologic process. Soft tissues and spinal canal:  Fluid in the posterior nasopharynx. No visible canal hematoma Disc levels: C1-C2 degenerative changes. Partial ankylosis C4-C5. Moderate disc space narrowing C6-C7. Multilevel facet degenerative change with foraminal narrowing. Upper chest: See separately dictated chest CT Other: None IMPRESSION: 1. Large acute right-sided subdural hematoma, measures up to 2.1 cm maximum thickness with mass effect on the underlying brain parenchyma and approximately 13 mm midline shift to the left. Mass effect on the right lateral and third ventricles with slight asymmetrical enlargement of the left atria of the lateral ventricle. Small volume acute left convexity subdural hematoma, maximum 5 mm thickness, with trace posterior inter hemispheric subdural blood and small volume blood layering along the right tentorium. 2. No CT evidence for acute facial bone fracture. Moderate right periorbital and forehead scalp hematoma. Moderate to large right posterior parietal scalp laceration and hematoma. 3. No CT evidence for acute osseous abnormality of the cervical spine. Degenerative changes. Critical Value/emergent results were called by telephone at the time of interpretation on 01/13/2024 at 9:55 Pm to provider Dr. Elvan Hamel, Who verbally acknowledged these results. Electronically Signed   By: Esmeralda Hedge M.D.   On: 01/13/2024 22:18   CT MAXILLOFACIAL WO CONTRAST Result  Date: 01/13/2024 CLINICAL DATA:  Ground level fall on Plavix  EXAM: CT HEAD WITHOUT CONTRAST CT MAXILLOFACIAL WITHOUT CONTRAST CT CERVICAL SPINE WITHOUT CONTRAST TECHNIQUE: Multidetector CT imaging of the head, cervical spine, and maxillofacial structures were performed using the standard protocol without intravenous contrast. Multiplanar CT image reconstructions of the cervical spine and maxillofacial structures were also generated. RADIATION DOSE REDUCTION: This exam was performed according to the departmental dose-optimization program which includes automated exposure  control, adjustment of the mA and/or kV according to patient size and/or use of iterative reconstruction technique. COMPARISON:  None Available. FINDINGS: CT HEAD FINDINGS Brain: Large right-sided acute subdural hematoma, measures up to 2.1 cm maximum thickness on coronal series 5, image 48. Mass effect on underlying brain parenchyma, about 13 mm midline shift to the left. Compression of the right lateral and third ventricles with slight asymmetrical enlargement of the left atrium of the lateral ventricle. Small volume acute left convexity subdural hematoma, measures 5 mm maximum thickness on series 5, image 50. Trace posterior inter hemispheric subdural blood, coronal series 5, image 22 and small volume blood layering along the right tentorium, coronal series 5, image 20. Vascular: No hyperdense vessels.  Carotid vascular calcification Skull: No convincing skull fracture Other: Moderate right periorbital and forehead hematoma. Moderate right posterior parietal scalp hematoma and laceration. Traumatic Brain Injury Risk Stratification Skull Fracture: No - Low/mBIG 1 Subdural Hematoma (SDH): 8mm plus - High/mBIG 3 Subarachnoid Hemorrhage [facility]): No Epidural Hematoma (EDH): No - Low/mBIG 1 Cerebral contusion, intra-axial, intraparenchymal Hemorrhage (IPH): No Intraventricular Hemorrhage (IVH): No - Low/mBIG 1 Midline Shift > 1mm or Edema/effacement of sulci/vents: Yes - High/mBIG 3 ---------------------------------------------------- CT MAXILLOFACIAL FINDINGS Osseous: Mastoid air cells are clear. Mandibular heads are normally position. No mandibular fracture. Pterygoid plates and zygomatic arches appear intact. No acute nasal bone fracture. Orbits: Negative. No traumatic or inflammatory finding. Sinuses: Moderate mucosal thickening in the sinuses. Fluid and debris in the posterior nasopharynx. No acute sinus wall fracture Soft tissues: Moderate right periorbital and forehead scalp hematoma. Moderate large right  posterior parietal scalp laceration and hematoma CT CERVICAL SPINE FINDINGS Alignment: No subluxation.  Facet alignment is normal Skull base and vertebrae: No acute fracture. No primary bone lesion or focal pathologic process. Soft tissues and spinal canal: Fluid in the posterior nasopharynx. No visible canal hematoma Disc levels: C1-C2 degenerative changes. Partial ankylosis C4-C5. Moderate disc space narrowing C6-C7. Multilevel facet degenerative change with foraminal narrowing. Upper chest: See separately dictated chest CT Other: None IMPRESSION: 1. Large acute right-sided subdural hematoma, measures up to 2.1 cm maximum thickness with mass effect on the underlying brain parenchyma and approximately 13 mm midline shift to the left. Mass effect on the right lateral and third ventricles with slight asymmetrical enlargement of the left atria of the lateral ventricle. Small volume acute left convexity subdural hematoma, maximum 5 mm thickness, with trace posterior inter hemispheric subdural blood and small volume blood layering along the right tentorium. 2. No CT evidence for acute facial bone fracture. Moderate right periorbital and forehead scalp hematoma. Moderate to large right posterior parietal scalp laceration and hematoma. 3. No CT evidence for acute osseous abnormality of the cervical spine. Degenerative changes. Critical Value/emergent results were called by telephone at the time of interpretation on 01/13/2024 at 9:55 Pm to provider Dr. Elvan Hamel, Who verbally acknowledged these results. Electronically Signed   By: Esmeralda Hedge M.D.   On: 01/13/2024 22:18   CT CERVICAL SPINE WO CONTRAST Result Date: 01/13/2024 CLINICAL DATA:  Ground level  fall on Plavix  EXAM: CT HEAD WITHOUT CONTRAST CT MAXILLOFACIAL WITHOUT CONTRAST CT CERVICAL SPINE WITHOUT CONTRAST TECHNIQUE: Multidetector CT imaging of the head, cervical spine, and maxillofacial structures were performed using the standard protocol without intravenous  contrast. Multiplanar CT image reconstructions of the cervical spine and maxillofacial structures were also generated. RADIATION DOSE REDUCTION: This exam was performed according to the departmental dose-optimization program which includes automated exposure control, adjustment of the mA and/or kV according to patient size and/or use of iterative reconstruction technique. COMPARISON:  None Available. FINDINGS: CT HEAD FINDINGS Brain: Large right-sided acute subdural hematoma, measures up to 2.1 cm maximum thickness on coronal series 5, image 48. Mass effect on underlying brain parenchyma, about 13 mm midline shift to the left. Compression of the right lateral and third ventricles with slight asymmetrical enlargement of the left atrium of the lateral ventricle. Small volume acute left convexity subdural hematoma, measures 5 mm maximum thickness on series 5, image 50. Trace posterior inter hemispheric subdural blood, coronal series 5, image 22 and small volume blood layering along the right tentorium, coronal series 5, image 20. Vascular: No hyperdense vessels.  Carotid vascular calcification Skull: No convincing skull fracture Other: Moderate right periorbital and forehead hematoma. Moderate right posterior parietal scalp hematoma and laceration. Traumatic Brain Injury Risk Stratification Skull Fracture: No - Low/mBIG 1 Subdural Hematoma (SDH): 8mm plus - High/mBIG 3 Subarachnoid Hemorrhage [facility]): No Epidural Hematoma (EDH): No - Low/mBIG 1 Cerebral contusion, intra-axial, intraparenchymal Hemorrhage (IPH): No Intraventricular Hemorrhage (IVH): No - Low/mBIG 1 Midline Shift > 1mm or Edema/effacement of sulci/vents: Yes - High/mBIG 3 ---------------------------------------------------- CT MAXILLOFACIAL FINDINGS Osseous: Mastoid air cells are clear. Mandibular heads are normally position. No mandibular fracture. Pterygoid plates and zygomatic arches appear intact. No acute nasal bone fracture. Orbits: Negative. No  traumatic or inflammatory finding. Sinuses: Moderate mucosal thickening in the sinuses. Fluid and debris in the posterior nasopharynx. No acute sinus wall fracture Soft tissues: Moderate right periorbital and forehead scalp hematoma. Moderate large right posterior parietal scalp laceration and hematoma CT CERVICAL SPINE FINDINGS Alignment: No subluxation.  Facet alignment is normal Skull base and vertebrae: No acute fracture. No primary bone lesion or focal pathologic process. Soft tissues and spinal canal: Fluid in the posterior nasopharynx. No visible canal hematoma Disc levels: C1-C2 degenerative changes. Partial ankylosis C4-C5. Moderate disc space narrowing C6-C7. Multilevel facet degenerative change with foraminal narrowing. Upper chest: See separately dictated chest CT Other: None IMPRESSION: 1. Large acute right-sided subdural hematoma, measures up to 2.1 cm maximum thickness with mass effect on the underlying brain parenchyma and approximately 13 mm midline shift to the left. Mass effect on the right lateral and third ventricles with slight asymmetrical enlargement of the left atria of the lateral ventricle. Small volume acute left convexity subdural hematoma, maximum 5 mm thickness, with trace posterior inter hemispheric subdural blood and small volume blood layering along the right tentorium. 2. No CT evidence for acute facial bone fracture. Moderate right periorbital and forehead scalp hematoma. Moderate to large right posterior parietal scalp laceration and hematoma. 3. No CT evidence for acute osseous abnormality of the cervical spine. Degenerative changes. Critical Value/emergent results were called by telephone at the time of interpretation on 01/13/2024 at 9:55 Pm to provider Dr. Elvan Hamel, Who verbally acknowledged these results. Electronically Signed   By: Esmeralda Hedge M.D.   On: 01/13/2024 22:18   DG Pelvis Portable Result Date: 01/13/2024 CLINICAL DATA:  Status post trauma. 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 A radiopaque fixation plate and screws are also seen along the proximal right femoral shaft. There is no evidence of an acute pelvic fracture or diastasis. No pelvic bone lesions are seen. IMPRESSION: 1. Intact bilateral total hip replacements. 2. No acute fracture or dislocation. Electronically Signed   By: Virgle Grime M.D.   On: 01/13/2024 22:15   DG Chest Port 1 View Result Date: 01/13/2024 CLINICAL DATA:  Status post trauma. EXAM: PORTABLE CHEST 1 VIEW COMPARISON:  December 22, 2022 FINDINGS: An endotracheal tube is seen with its distal tip approximately 4.3 cm from the carina. An enteric tube is in place with its distal tip overlying the body of the stomach. The distal side hole sits approximately 3.1 cm distal to the expected region of the gastroesophageal junction. The heart size and mediastinal contours are within normal limits. Low lung volumes are noted. Mild atelectasis is suspected within the left lung base. There is no evidence of acute infiltrate, pleural effusion or pneumothorax. Radiopaque surgical clips are seen within the right upper quadrant. There is evidence of prior bilateral shoulder arthroplasty. Multilevel degenerative changes are present throughout the thoracic spine. IMPRESSION: 1. Endotracheal tube and enteric tube positioning, as described above. 2. Low lung volumes with mild left basilar atelectasis. Electronically Signed   By: Virgle Grime M.D.   On: 01/13/2024 22:09    Imaging: Personally reviewed  A/P: [name] [name] is an 83 y.o. female  Fall Severe TBI  Bilateral SDH, right >L with shift Complex scalp lac Anemia of chronic disease HTN CAD H/o TIA on plavix  Right knee abrasion/contusion   On arrival the patient was essentially unresponsive.  She did have some spontaneous movement.  She was mildly bradycardic at times and hypertensive.  Given her GCS of  around 6 we decided to intubate her for airway protection.  She was started on propofol  after intubation.  She was quite hypertensive at times with systolic above 200s.  She was transported to CT where she was found to have a large right-sided subdural hematoma with shift.  Neurosurgery was immediately available and at the bedside shortly after return from the CT scanner.  Dr. Michale Age reviewed her imaging and did affect exam and discussed the prognosis with the family.  He felt it was a nonsurvivable injury.  Her pupil exam had deteriorated and she had no cough or gag  Her pulse was slowing and her blood pressure was decreasing so the propofol  was decreased and stopped and she was started on a little bit of phenylephrine  while the family was gathering.  I also discussed the CT findings with the patient's 3 daughters and son-in-law's.  We discussed her CODE STATUS.  They felt that she would not want to be resuscitated and they thought that she had a DNR.  But they all agreed that she should not be resuscitated given the diagnosis and prognosis.  Dr. Cabbell did not feel reversal agent was indicated given the degree of injury.  She will be DNR we will try to do to be we can to help maintain her vitals until the rest of the family can arrive but will not escalate care  Data reviewed: I reviewed her hematology office note, cardiology office note and PCP note CT scan of her head, C-spine, face, chest abdomen pelvis, diagnostic chest and pelvic x-ray, labs from today. discussed case with neurosurgery and ED  Attending  Critical care 30 minutes  Trauma management,  disposition arrangement, consultation discussion with specialists Critical care time was exclusive of separately billable procedures and treating other patients.   Critical care was necessary to treat or prevent imminent or life-threatening deterioration.   Critical care was time spent personally by me on the following activities:  development of treatment plan with patient and/or surrogate as well as nursing, discussions with consultants, evaluation of patient's response to treatment, examination of patient, obtaining history from patient or surrogate, ordering and performing treatments and interventions, ordering and review of laboratory studies, ordering and review of radiographic studies, pulse oximetry and re-evaluation of patient's condition.  Marianna Shirk. Elvan Hamel, MD, FACS General, Bariatric, & Minimally Invasive Surgery [facility]

## 2024-01-13 NOTE — ED Notes (Signed)
 Neurosurg at bedside

## 2024-01-13 NOTE — ED Notes (Signed)
 XR at bedside

## 2024-01-13 NOTE — ED Provider Notes (Signed)
 [place] EMERGENCY DEPARTMENT AT [facility] Provider Note  History  Chief Complaint:  Trauma (Level 1 FOT)  The history is provided by the EMS personnel.  Trauma Mechanism of injury: Fall Injury location: head/neck Injury location detail: head   Fall:      Fall occurred: tripped      Impact surface: inside fireplace.  EMS/PTA data:      Mental status condition since incident: worsening  Relevant PMH:      Pharmacological risk factors:            Antiplatelet therapy.       Tetanus status: unknown    [name] [name] is a 83 y.o. female with a history of GERD, anxiety who presents the emergency department after a fall with AMS.  No past medical history on file.  No family history on file.     Review of Systems  Review of Systems   Reviewed and documented in HPI if pertinent.   Physical Exam   ED Triage Vitals  Encounter Vitals Group     BP 01/13/24 2109 (!) 150/70     Systolic BP Percentile --      Diastolic BP Percentile --      Pulse Rate 01/13/24 2111 (!) 43     Resp 01/13/24 2111 (!) 21     Temp 01/13/24 2112 (!) 95.8 F (35.4 C)     Temp Source 01/13/24 2112 Temporal     SpO2 01/13/24 2111 100 %     Weight 01/13/24 2119 165 lb 5.5 oz (75 kg)     Height --      Head Circumference --      Peak Flow --      Pain Score --      Pain Loc --      Pain Education --      Exclude from Growth Chart --     Physical Exam  Constitutional Nursing notes reviewed Vital signs reviewed  Head R eyebrow with crescent laceration, R parietal laceration with venous oozing  ENT PERRL No conjunctival hemorrhage No periorbital ecchymoses, Racoon Eyes, or Battle Sign bilaterally Ears atraumatic No nasal septal deviation or hematoma Mouth and tongue atraumatic Trachea midline.  Neck No C spine stepoffs, deformities, or tenderness C collar in place  Chest Clavicles atraumatic Clavicles stable to anterior compression without crepitus Chest wall  with symmetric expansion Chest wall stable to anterior and lateral compression without crepitus  Respiratory Effort normal CTAB No respiratory distress  CV Normal rate DP and radial pulses 2+ and equal bilaterally  Abdomen Soft Non-tender Non-distended No peritonitis No abrasions/contusions  GU Atraumatic No gross blood  MSK Atraumatic No obvious deformity ROM appropriate Pelvis stable to lateral compression  Back No step offs or deformities  Decreased rectal tone (s/p intubation)  Skin Warm Dry  Neuro GCS 6 (e1, v1, m4)   Procedures   Procedure Name: Intubation Date/Time: 01/13/2024 9:32 PM  Performed by: [name] [name], MDOxygen Delivery Method: Non-rebreather mask Preoxygenation: Pre-oxygenation with 100% oxygen Induction Type: IV induction and Rapid sequence Laryngoscope Size: Glidescope and 4 Grade View: Grade I Tube size: 7.5 mm Number of attempts: 1 Airway Equipment and Method: Rigid stylet Placement Confirmation: ETT inserted through vocal cords under direct vision, Positive ETCO2, CO2 detector and Breath sounds checked- equal and bilateral Secured at: 23 cm Tube secured with: ETT holder Dental Injury: Teeth and Oropharynx as per pre-operative assessment       ED Course - Medical  Decision Making  Brief Overview [name] [name] is a 83 y.o. female who presents as per above.  I have reviewed the nursing documentation for past medical history, family history, and social history and agree.  I have reviewed the patient's vital signs. Hypertensive.  Initial Differential Diagnoses: I am primarily concerned for intracranial bleed, intra-abdominal bleeding, rib fractures, pneumothorax, hemothorax, life-threatening extremity injury, fracture, dislocation, thoracic spine fracture, lumbar spine fracture, cervical spine fracture  Therapies: These medications and interventions were provided for the patient while in the ED.  Medications  propofol   (DIPRIVAN ) 1000 MG/100ML infusion (20 mcg/kg/min  75 kg (Order-Specific) Intravenous Restarted 01/13/24 2254)  Tdap (BOOSTRIX) injection 0.5 mL (has no administration in time range)  phenylephrine  (NEO-SYNEPHRINE) 20mg /NS 250mL premix infusion (0 mcg/min Intravenous Stopped 01/13/24 2236)  0.9 %  sodium chloride  infusion (0 mLs Intravenous Stopped 01/13/24 2348)  0.9 %  sodium chloride  infusion (0 mLs Intravenous Stopped 01/13/24 2230)  etomidate (AMIDATE) injection (20 mg Intravenous Given 01/13/24 2116)  succinylcholine  (ANECTINE ) injection (100 mg Intravenous Given 01/13/24 2116)  propofol  (DIPRIVAN ) bolus via infusion 50 mg (50 mg Intravenous Bolus from Bag 01/13/24 2122)  fentaNYL  (SUBLIMAZE ) injection 50 mcg (50 mcg Intravenous Given 01/13/24 2123)  propofol  (DIPRIVAN ) bolus via infusion 50 mg (50 mg Intravenous Bolus from Bag 01/13/24 2130)  iohexol  (OMNIPAQUE ) 350 MG/ML injection 75 mL (75 mLs Intravenous Contrast Given 01/13/24 2144)    Testing Results: On my interpretation labs are significant for : Hgb 9.6 Cr 1.16  On my interpretation imaging is significant for: CT with large R SDH with midline shift  See the EMR for full details regarding lab and imaging results.   Medical Decision Making [name] [name] is a 83 y.o. female with significant PMHx as above who presented to the ED by EMS as an activated Level 1 trauma for fall with decreased GCS.  Prior to arrival of the patient, the room was prepared with the following: code cart to bedside, glidescope, suction x2, BVM. Trauma team was present prior to arrival of the patient.    Upon arrival of the patient, EMS provided pertinent history and exam findings. The patient was transferred over to the trauma bed. ABCs intact as exam below. Once IVs were established, the secondary exam was performed. Pertinent physical exam findings include large laceration to R parietal skull, right eyebrow, GCS is 6. Portable XRs performed at the bedside.The  patient was then prepared and sent to the CT for trauma scans.   Full trauma scans were  performed and results are significant for large right subdural with severe midline shift. Trauma labs reveal anemia.   Neurosurgery was consulted by trauma surgery.  They evaluate the patient and are concerned this could represent a life altering brain bleed.  They are concerned that any intervention would not change course of action.  Trauma surgery discussed with the family which included 3 daughters and a son.  They have agreed on DNR at this time.  Given the severe trauma and interventions would not change management we have elected not to reverse per neurosurgery recommendations.  Patient was placed on pressors in order for family to gather at the patient's bedside.  Patient will be admitted to trauma surgery in critical condition.  Amount and/or Complexity of Data Reviewed Labs: ordered. Radiology: ordered.  Risk Prescription drug management. Decision regarding hospitalization.     ### All radiography studies, electrocardiograms, and laboratory data were personally reviewed by me and incorporated into my medical decision making. Impression  1. SDH (subdural hematoma) (HCC)      Note: Dragon medical dictation software was used in the creation of this note.     [name] Landmark, MD 01/13/24 2356    Albertus Hughs, DO 01/03/2024 1501

## 2024-01-13 NOTE — Progress Notes (Signed)
 Orthopedic Tech Progress Note Patient Details:  [name] [name] [birth year] [id number]  Patient ID: Newton Barer, female   DOB: 10/19/1940, 83 y.o.   MRN: [id number] LV2T not needed. No obvious deformities.  Udell Gamble 01/13/2024, 9:23 PM

## 2024-01-13 NOTE — ED Notes (Signed)
 Pt transported to CT with this rn, trn, pharmacy and rt

## 2024-01-14 ENCOUNTER — Encounter: Payer: Self-pay | Admitting: Hematology and Oncology

## 2024-01-14 LAB — SAMPLE TO BLOOD BANK

## 2024-01-14 MED ORDER — POLYETHYLENE GLYCOL 3350 17 G PO PACK: 17.0000 g | PACK | Freq: Every day | ORAL | Status: DC | PRN

## 2024-01-14 MED ORDER — SODIUM CHLORIDE 0.9 % IV SOLN: INTRAVENOUS | Status: DC

## 2024-01-14 MED ORDER — DOCUSATE SODIUM 100 MG PO CAPS: 100.0000 mg | ORAL_CAPSULE | Freq: Two times a day (BID) | ORAL | Status: DC

## 2024-01-14 MED ORDER — POLYVINYL ALCOHOL 1.4 % OP SOLN
1.0000 [drp] | Freq: Four times a day (QID) | OPHTHALMIC | Status: DC | PRN
  Filled 2024-01-14: qty 15

## 2024-01-14 MED ORDER — ACETAMINOPHEN 650 MG RE SUPP: 650.0000 mg | Freq: Four times a day (QID) | RECTAL | Status: DC | PRN

## 2024-01-14 MED ORDER — GLYCOPYRROLATE 0.2 MG/ML IJ SOLN: 0.2000 mg | INTRAMUSCULAR | Status: DC | PRN

## 2024-01-14 MED ORDER — MIDAZOLAM HCL 2 MG/2ML IJ SOLN: 2.0000 mg | INTRAMUSCULAR | Status: DC | PRN

## 2024-01-14 MED ORDER — ACETAMINOPHEN 500 MG PO TABS: 1000.0000 mg | ORAL_TABLET | Freq: Four times a day (QID) | ORAL | Status: DC

## 2024-01-14 MED ORDER — MORPHINE BOLUS VIA INFUSION
5.0000 mg | INTRAVENOUS | Status: DC | PRN
  Administered 2024-01-14 (×3): 5 mg via INTRAVENOUS

## 2024-01-14 MED ORDER — ACETAMINOPHEN 325 MG PO TABS: 650.0000 mg | ORAL_TABLET | Freq: Four times a day (QID) | ORAL | Status: DC | PRN

## 2024-01-14 MED ORDER — GLYCOPYRROLATE 1 MG PO TABS: 1.0000 mg | ORAL_TABLET | ORAL | Status: DC | PRN

## 2024-01-14 MED ORDER — METHOCARBAMOL 500 MG PO TABS
500.0000 mg | ORAL_TABLET | Freq: Three times a day (TID) | ORAL | Status: DC
  Filled 2024-01-14: qty 1

## 2024-01-14 MED ORDER — HYDRALAZINE HCL 20 MG/ML IJ SOLN: 10.0000 mg | INTRAMUSCULAR | Status: DC | PRN

## 2024-01-14 MED ORDER — DOCUSATE SODIUM 50 MG/5ML PO LIQD: 100.0000 mg | Freq: Two times a day (BID) | ORAL | Status: DC

## 2024-01-14 MED ORDER — METHOCARBAMOL 1000 MG/10ML IJ SOLN: 500.0000 mg | Freq: Three times a day (TID) | INTRAMUSCULAR | Status: DC

## 2024-01-14 MED ORDER — FENTANYL CITRATE PF 50 MCG/ML IJ SOSY: 25.0000 ug | PREFILLED_SYRINGE | INTRAMUSCULAR | Status: DC | PRN

## 2024-01-14 MED ORDER — ACETAMINOPHEN 500 MG PO TABS
1000.0000 mg | ORAL_TABLET | Freq: Four times a day (QID) | ORAL | Status: DC
  Filled 2024-01-14: qty 2

## 2024-01-14 MED ORDER — MORPHINE 100MG IN NS 100ML (1MG/ML) PREMIX INFUSION
0.0000 mg/h | INTRAVENOUS | Status: DC
  Administered 2024-01-14: 5 mg/h via INTRAVENOUS
  Administered 2024-01-14: 15 mg/h via INTRAVENOUS
  Filled 2024-01-14 (×2): qty 100

## 2024-01-14 MED ORDER — ONDANSETRON 4 MG PO TBDP: 4.0000 mg | ORAL_TABLET | Freq: Four times a day (QID) | ORAL | Status: DC | PRN

## 2024-01-14 MED ORDER — ORAL CARE MOUTH RINSE: 15.0000 mL | OROMUCOSAL | Status: DC | PRN

## 2024-01-14 MED ORDER — ONDANSETRON HCL 4 MG/2ML IJ SOLN: 4.0000 mg | Freq: Four times a day (QID) | INTRAMUSCULAR | Status: DC | PRN

## 2024-01-14 MED ORDER — ORAL CARE MOUTH RINSE
15.0000 mL | OROMUCOSAL | Status: DC
  Administered 2024-01-14: 15 mL via OROMUCOSAL

## 2024-01-14 MED ORDER — PROPOFOL 1000 MG/100ML IV EMUL: 0.0000 ug/kg/min | INTRAVENOUS | Status: DC

## 2024-01-14 MED ORDER — METOPROLOL TARTRATE 5 MG/5ML IV SOLN: 5.0000 mg | Freq: Four times a day (QID) | INTRAVENOUS | Status: DC | PRN

## 2024-01-14 MED ORDER — CHLORHEXIDINE GLUCONATE CLOTH 2 % EX PADS
6.0000 | MEDICATED_PAD | Freq: Every day | CUTANEOUS | Status: DC
  Administered 2024-01-14: 6 via TOPICAL

## 2024-01-23 NOTE — Procedures (Signed)
 Extubation Procedure Note  Patient Details:   Name: [name] [name] DOB: 1940-10-07 MRN: [id number]   Airway Documentation:  Airway 7.5 mm (Active)  Secured at (cm) 22 cm 01/01/2024 0026  Measured From Lips 01/07/2024 0026  Secured Location Center 01/05/2024 0026  Secured By [employer] 01/18/2024 0026  Bite Block No 01/16/2024 0026  Tube Holder Repositioned Yes 01/07/2024 0026  Prone position No 01/20/2024 0026  Cuff Pressure (cm H2O) MOV (Manual Technique) 12/23/2023 0026  Site Condition Dry 01/09/2024 0026   Vent end date: (not recorded) Vent end time: (not recorded)   Evaluation  O2 sats: currently acceptable Complications: No apparent complications Patient did tolerate procedure well. Bilateral Breath Sounds: Diminished   No Pt extubated to [facility] per extubation order. RN and family at bedside. Aron Bien Sonji Starkes 01/10/2024, 2:16 AM

## 2024-01-23 NOTE — Progress Notes (Signed)
 RT transported pt from TRA-B to 4N27 with RN at bedside. No complications at this time. Family directed to waiting area.

## 2024-01-23 NOTE — Progress Notes (Signed)
 Trauma/Critical Care Follow Up Note  Subjective:    Overnight Issues:   Objective:  Vital signs for last 24 hours: Temp:  [95.8 F (35.4 C)-98.2 F (36.8 C)] 98.2 F (36.8 C) (05/23 0026) Pulse Rate:  [36-104] 86 (05/23 1000) Resp:  [0-25] 21 (05/23 1000) BP: (62-248)/(30-151) 124/84 (05/23 0100) SpO2:  [82 %-100 %] 82 % (05/23 1000) FiO2 (%):  [100 %] 100 % (05/23 0100) Weight:  [75 kg] 75 kg (05/22 2119)  Hemodynamic parameters for last 24 hours:    Intake/Output from previous day: 05/22 0701 - 05/23 0700 In: 2102.9 [I.V.:2102.9] Out: 350 [Urine:350]  Intake/Output this shift: No intake/output data recorded.  Vent settings for last 24 hours: Vent Mode: PRVC FiO2 (%):  [100 %] 100 % Set Rate:  [15 bmp] 15 bmp Vt Set:  [500 mL] 500 mL PEEP:  [5 cmH20] 5 cmH20 Plateau Pressure:  [15 cmH20] 15 cmH20  Physical Exam:  Gen: comfortable, no distress Neck: supple CV: RRR Pulm: unlabored breathing on RA  Results for orders placed or performed during the hospital encounter of 01/13/24 (from the past 24 hours)  Comprehensive metabolic panel     Status: Abnormal   Collection Time: 01/13/24  9:07 PM  Result Value Ref Range   Sodium 138 135 - 145 mmol/L   Potassium 4.1 3.5 - 5.1 mmol/L   Chloride 109 98 - 111 mmol/L   CO2 19 (L) 22 - 32 mmol/L   Glucose, Bld 140 (H) 70 - 99 mg/dL   BUN 23 8 - 23 mg/dL   Creatinine, Ser 1.30 (H) 0.44 - 1.00 mg/dL   Calcium  9.1 8.9 - 10.3 mg/dL   Total Protein 6.7 6.5 - 8.1 g/dL   Albumin 3.4 (L) 3.5 - 5.0 g/dL   AST 34 15 - 41 U/L   ALT 17 0 - 44 U/L   Alkaline Phosphatase 52 38 - 126 U/L   Total Bilirubin 0.7 0.0 - 1.2 mg/dL   GFR, Estimated 46 (L) >60 mL/min   Anion gap 10 5 - 15  CBC     Status: Abnormal   Collection Time: 01/13/24  9:07 PM  Result Value Ref Range   WBC 12.4 (H) 4.0 - 10.5 K/uL   RBC 3.42 (L) 3.87 - 5.11 MIL/uL   Hemoglobin 9.6 (L) 12.0 - 15.0 g/dL   HCT 86.5 (L) 78.4 - 69.6 %   MCV 89.8 80.0 - 100.0 fL    MCH 28.1 26.0 - 34.0 pg   MCHC 31.3 30.0 - 36.0 g/dL   RDW 29.5 28.4 - 13.2 %   Platelets 284 150 - 400 K/uL   nRBC 0.0 0.0 - 0.2 %  Ethanol     Status: None   Collection Time: 01/13/24  9:07 PM  Result Value Ref Range   Alcohol, Ethyl (B) <15 <15 mg/dL  Protime-INR     Status: None   Collection Time: 01/13/24  9:07 PM  Result Value Ref Range   Prothrombin Time 13.3 11.4 - 15.2 seconds   INR 1.0 0.8 - 1.2  I-Stat Chem 8, ED     Status: Abnormal   Collection Time: 01/13/24  9:16 PM  Result Value Ref Range   Sodium 139 135 - 145 mmol/L   Potassium 4.2 3.5 - 5.1 mmol/L   Chloride 109 98 - 111 mmol/L   BUN 31 (H) 8 - 23 mg/dL   Creatinine, Ser 4.40 (H) 0.44 - 1.00 mg/dL   Glucose, Bld 102 (H) 70 -  99 mg/dL   Calcium , Ion 1.16 1.15 - 1.40 mmol/L   TCO2 20 (L) 22 - 32 mmol/L   Hemoglobin 9.9 (L) 12.0 - 15.0 g/dL   HCT 69.6 (L) 29.5 - 28.4 %  I-Stat Lactic Acid, ED     Status: None   Collection Time: 01/13/24  9:17 PM  Result Value Ref Range   Lactic Acid, Venous 1.7 0.5 - 1.9 mmol/L  Sample to Blood Bank     Status: None   Collection Time: 01/04/2024  1:21 AM  Result Value Ref Range   Blood Bank Specimen SAMPLE AVAILABLE FOR TESTING    Sample Expiration      01/17/2024,2359 Performed at [facility], [address], [place], [place] [address]     Assessment & Plan: The plan of care was discussed with the bedside nurse for the day, who is in agreement with this plan and no additional concerns were raised.   Present on Admission:  TBI (traumatic brain injury) (HCC)    LOS: 1 day   Additional comments:I reviewed the patient's new clinical lab test results.   and I reviewed the patients new imaging test results.    Fall  Severe TBI, bilateral SDH, right >L with shift - nonsurvivable injury, NSGY c/s, Dr. Sarah Cumber, patient seen in conjunction with Dr. Michale Age this AM, transitioned to comfort care overnight Complex scalp lac Anemia of chronic disease HTN CAD H/o  TIA on plavix  Right knee abrasion/contusion   Continue morphine  drip, transfer to 6N  Critical Care Total Time: 35 minutes  Anda Bamberg, MD Trauma & General Surgery Please use [email or web address] to contact on call provider  12/31/2023  *Care during the described time interval was provided by me. I have reviewed this patient's available data, including medical history, events of note, physical examination and test results as part of my evaluation.

## 2024-01-23 NOTE — Progress Notes (Addendum)
 ME requests body to be taken to morgue when possible. Family states one more son on the way, and he will be here in a few minutes.  Family finished visiting at 1337. Pt to morgue at 1351.

## 2024-01-23 NOTE — Progress Notes (Signed)
 Nurse paged stating that family requested full comfort measures at this time and extubation.   Comfort care orderset initiated    Marianna Shirk. Elvan Hamel, MD, FACS General, Bariatric, & Minimally Invasive Surgery [facility],  [facility]

## 2024-01-23 NOTE — TOC CAGE-AID Note (Addendum)
 Transition of Care [facility]) - CAGE-AID Screening   Patient Details  Name: [name] [name] MRN: [id number] Date of Birth: December 04, 1940  [facility] CM/SW Contact:    Malick Netz E Jeromey Kruer, LCSW Phone Number: 01/10/2024, 10:39 AM   Clinical Narrative: Patient is on comfort care.  CAGE-AID Screening: Substance Abuse Screening unable to be completed due to: : Patient unable to participate (N/A)

## 2024-01-23 NOTE — Progress Notes (Signed)
 Postmortem note:  60 mL morphine  gtt wasted in [employer] with [employer].

## 2024-01-23 NOTE — Progress Notes (Signed)
 Pt passed at 12:19. No breathing, no pulse, no EKG activity noted.No heart or lung sounds audible. TOD confirmed by RN Spencer Dy.

## 2024-01-23 DEATH — deceased

## 2024-01-25 ENCOUNTER — Inpatient Hospital Stay

## 2024-01-25 ENCOUNTER — Inpatient Hospital Stay: Admitting: Hematology and Oncology

## 2024-02-22 NOTE — Death Summary Note (Signed)
 DEATH SUMMARY   Patient Details  Name: [name] [name] MRN: [id number] DOB: [birth year]  Admission/Discharge Information   Admit Date:  [name] 17, 2024  Date of Death: Date of Death: 01-18-24  Time of Death: Time of Death: 02/03/1218  Length of Stay: 1  Referring Physician: Pcp, No   Reason(s) for Hospitalization  Fall  Diagnoses  Preliminary cause of death:  Secondary Diagnoses (including complications and co-morbidities):  Principal Problem:   TBI (traumatic brain injury) [facility])   Brief Hospital Course (including significant findings, care, treatment, and services provided and events leading to death)  [name] [name] is a 83 y.o. year old female who s/p fall with devastating ICH. Transitioned to comfort care and expired.     Pertinent Labs and Studies  Significant Diagnostic Studies CT CHEST ABDOMEN PELVIS W CONTRAST Result Date: 01-17-2024 CLINICAL DATA:  Trauma EXAM: CT CHEST, ABDOMEN, AND PELVIS WITH CONTRAST TECHNIQUE: Multidetector CT imaging of the chest, abdomen and pelvis was performed following the standard protocol during bolus administration of intravenous contrast. RADIATION DOSE REDUCTION: This exam was performed according to the departmental dose-optimization program which includes automated exposure control, adjustment of the mA and/or kV according to patient size and/or use of iterative reconstruction technique. CONTRAST:  75mL OMNIPAQUE  IOHEXOL  350 MG/ML SOLN COMPARISON:  CT of the chest 06/19/2019 CT stone 09/06/2007 FINDINGS: CT CHEST FINDINGS Cardiovascular: The heart is enlarged. Aorta is normal in size. There is no pericardial effusion. There are atherosclerotic calcifications of the aorta and coronary arteries. Mediastinum/Nodes: Enteric tube is seen throughout the soft a Gus. There is a small hiatal hernia. There is a small amount of fluid throughout the entire esophagus. Visualized thyroid  gland is within normal limits. No enlarged lymph nodes are identified. No evidence  for pneumomediastinum or mediastinal hematoma. Lungs/Pleura: Endotracheal tube tip 1.4 cm above the carina. There are ground-glass and airspace opacities in the dependent portions of the inferior bilateral lower lobes, left greater than right. The lungs are otherwise clear. There is no pleural effusion or pneumothorax identified. Musculoskeletal: Bilateral shoulder arthroplasties are present. No acute fractures are seen. CT ABDOMEN PELVIS FINDINGS Hepatobiliary: Patient is status post cholecystectomy. There is mild intra and extrahepatic biliary ductal dilatation. No focal liver lesion or subcapsular fluid collection. Pancreas: Unremarkable. No pancreatic ductal dilatation or surrounding inflammatory changes. Spleen: No splenic injury or perisplenic hematoma. Adrenals/Urinary Tract: Bladder not well evaluated secondary to streak artifact in the pelvis. There is no hydronephrosis or perinephric stranding. Renal cysts are present on the right measuring up to 3.6 cm. Adrenal glands are within normal limits. Stomach/Bowel: Stomach is within normal limits. Enteric tube tip is in the body of the stomach. No evidence of bowel wall thickening, distention, or inflammatory changes. There are scattered colonic diverticula. The appendix is not seen. Vascular/Lymphatic: Aortic atherosclerosis. No enlarged abdominal or pelvic lymph nodes. Reproductive: Status post hysterectomy. No adnexal masses. Other: No abdominal wall hernia or abnormality. No abdominopelvic ascites. Musculoskeletal: No acute fractures are seen. Bilateral hip arthroplasties are present. Degenerative changes affect the spine. The IMPRESSION: 1. No acute posttraumatic sequelae in the chest, abdomen or pelvis. 2. Bibasilar ground-glass and airspace opacities, left greater than right, worrisome for aspiration or infection. 3. Cardiomegaly. 4. Small hiatal hernia with fluid throughout the esophagus compatible with gastroesophageal reflux. 5. Status post  cholecystectomy with mild intra and extrahepatic biliary ductal dilatation. 6. Colonic diverticulosis. Aortic Atherosclerosis (ICD10-I70.0). Electronically Signed   By: Tyron Gallon M.D.   On: 01/17/24 22:18   CT HEAD  WO CONTRAST Result Date: 01/13/2024 CLINICAL DATA:  Ground level fall on Plavix  EXAM: CT HEAD WITHOUT CONTRAST CT MAXILLOFACIAL WITHOUT CONTRAST CT CERVICAL SPINE WITHOUT CONTRAST TECHNIQUE: Multidetector CT imaging of the head, cervical spine, and maxillofacial structures were performed using the standard protocol without intravenous contrast. Multiplanar CT image reconstructions of the cervical spine and maxillofacial structures were also generated. RADIATION DOSE REDUCTION: This exam was performed according to the departmental dose-optimization program which includes automated exposure control, adjustment of the mA and/or kV according to patient size and/or use of iterative reconstruction technique. COMPARISON:  None Available. FINDINGS: CT HEAD FINDINGS Brain: Large right-sided acute subdural hematoma, measures up to 2.1 cm maximum thickness on coronal series 5, image 48. Mass effect on underlying brain parenchyma, about 13 mm midline shift to the left. Compression of the right lateral and third ventricles with slight asymmetrical enlargement of the left atrium of the lateral ventricle. Small volume acute left convexity subdural hematoma, measures 5 mm maximum thickness on series 5, image 50. Trace posterior inter hemispheric subdural blood, coronal series 5, image 22 and small volume blood layering along the right tentorium, coronal series 5, image 20. Vascular: No hyperdense vessels.  Carotid vascular calcification Skull: No convincing skull fracture Other: Moderate right periorbital and forehead hematoma. Moderate right posterior parietal scalp hematoma and laceration. Traumatic Brain Injury Risk Stratification Skull Fracture: No - Low/mBIG 1 Subdural Hematoma (SDH): 8mm plus - High/mBIG 3  Subarachnoid Hemorrhage [facility]): No Epidural Hematoma (EDH): No - Low/mBIG 1 Cerebral contusion, intra-axial, intraparenchymal Hemorrhage (IPH): No Intraventricular Hemorrhage (IVH): No - Low/mBIG 1 Midline Shift > 1mm or Edema/effacement of sulci/vents: Yes - High/mBIG 3 ---------------------------------------------------- CT MAXILLOFACIAL FINDINGS Osseous: Mastoid air cells are clear. Mandibular heads are normally position. No mandibular fracture. Pterygoid plates and zygomatic arches appear intact. No acute nasal bone fracture. Orbits: Negative. No traumatic or inflammatory finding. Sinuses: Moderate mucosal thickening in the sinuses. Fluid and debris in the posterior nasopharynx. No acute sinus wall fracture Soft tissues: Moderate right periorbital and forehead scalp hematoma. Moderate large right posterior parietal scalp laceration and hematoma CT CERVICAL SPINE FINDINGS Alignment: No subluxation.  Facet alignment is normal Skull base and vertebrae: No acute fracture. No primary bone lesion or focal pathologic process. Soft tissues and spinal canal: Fluid in the posterior nasopharynx. No visible canal hematoma Disc levels: C1-C2 degenerative changes. Partial ankylosis C4-C5. Moderate disc space narrowing C6-C7. Multilevel facet degenerative change with foraminal narrowing. Upper chest: See separately dictated chest CT Other: None IMPRESSION: 1. Large acute right-sided subdural hematoma, measures up to 2.1 cm maximum thickness with mass effect on the underlying brain parenchyma and approximately 13 mm midline shift to the left. Mass effect on the right lateral and third ventricles with slight asymmetrical enlargement of the left atria of the lateral ventricle. Small volume acute left convexity subdural hematoma, maximum 5 mm thickness, with trace posterior inter hemispheric subdural blood and small volume blood layering along the right tentorium. 2. No CT evidence for acute facial bone fracture. Moderate right  periorbital and forehead scalp hematoma. Moderate to large right posterior parietal scalp laceration and hematoma. 3. No CT evidence for acute osseous abnormality of the cervical spine. Degenerative changes. Critical Value/emergent results were called by telephone at the time of interpretation on 01/13/2024 at 9:55 Pm to provider Dr. Elvan Hamel, Who verbally acknowledged these results. Electronically Signed   By: Esmeralda Hedge M.D.   On: 01/13/2024 22:18   CT MAXILLOFACIAL WO CONTRAST Result Date: 01/13/2024 CLINICAL DATA:  Ground level fall on Plavix  EXAM: CT HEAD WITHOUT CONTRAST CT MAXILLOFACIAL WITHOUT CONTRAST CT CERVICAL SPINE WITHOUT CONTRAST TECHNIQUE: Multidetector CT imaging of the head, cervical spine, and maxillofacial structures were performed using the standard protocol without intravenous contrast. Multiplanar CT image reconstructions of the cervical spine and maxillofacial structures were also generated. RADIATION DOSE REDUCTION: This exam was performed according to the departmental dose-optimization program which includes automated exposure control, adjustment of the mA and/or kV according to patient size and/or use of iterative reconstruction technique. COMPARISON:  None Available. FINDINGS: CT HEAD FINDINGS Brain: Large right-sided acute subdural hematoma, measures up to 2.1 cm maximum thickness on coronal series 5, image 48. Mass effect on underlying brain parenchyma, about 13 mm midline shift to the left. Compression of the right lateral and third ventricles with slight asymmetrical enlargement of the left atrium of the lateral ventricle. Small volume acute left convexity subdural hematoma, measures 5 mm maximum thickness on series 5, image 50. Trace posterior inter hemispheric subdural blood, coronal series 5, image 22 and small volume blood layering along the right tentorium, coronal series 5, image 20. Vascular: No hyperdense vessels.  Carotid vascular calcification Skull: No convincing skull  fracture Other: Moderate right periorbital and forehead hematoma. Moderate right posterior parietal scalp hematoma and laceration. Traumatic Brain Injury Risk Stratification Skull Fracture: No - Low/mBIG 1 Subdural Hematoma (SDH): 8mm plus - High/mBIG 3 Subarachnoid Hemorrhage [facility]): No Epidural Hematoma (EDH): No - Low/mBIG 1 Cerebral contusion, intra-axial, intraparenchymal Hemorrhage (IPH): No Intraventricular Hemorrhage (IVH): No - Low/mBIG 1 Midline Shift > 1mm or Edema/effacement of sulci/vents: Yes - High/mBIG 3 ---------------------------------------------------- CT MAXILLOFACIAL FINDINGS Osseous: Mastoid air cells are clear. Mandibular heads are normally position. No mandibular fracture. Pterygoid plates and zygomatic arches appear intact. No acute nasal bone fracture. Orbits: Negative. No traumatic or inflammatory finding. Sinuses: Moderate mucosal thickening in the sinuses. Fluid and debris in the posterior nasopharynx. No acute sinus wall fracture Soft tissues: Moderate right periorbital and forehead scalp hematoma. Moderate large right posterior parietal scalp laceration and hematoma CT CERVICAL SPINE FINDINGS Alignment: No subluxation.  Facet alignment is normal Skull base and vertebrae: No acute fracture. No primary bone lesion or focal pathologic process. Soft tissues and spinal canal: Fluid in the posterior nasopharynx. No visible canal hematoma Disc levels: C1-C2 degenerative changes. Partial ankylosis C4-C5. Moderate disc space narrowing C6-C7. Multilevel facet degenerative change with foraminal narrowing. Upper chest: See separately dictated chest CT Other: None IMPRESSION: 1. Large acute right-sided subdural hematoma, measures up to 2.1 cm maximum thickness with mass effect on the underlying brain parenchyma and approximately 13 mm midline shift to the left. Mass effect on the right lateral and third ventricles with slight asymmetrical enlargement of the left atria of the lateral ventricle.  Small volume acute left convexity subdural hematoma, maximum 5 mm thickness, with trace posterior inter hemispheric subdural blood and small volume blood layering along the right tentorium. 2. No CT evidence for acute facial bone fracture. Moderate right periorbital and forehead scalp hematoma. Moderate to large right posterior parietal scalp laceration and hematoma. 3. No CT evidence for acute osseous abnormality of the cervical spine. Degenerative changes. Critical Value/emergent results were called by telephone at the time of interpretation on 01/13/2024 at 9:55 Pm to provider Dr. Elvan Hamel, Who verbally acknowledged these results. Electronically Signed   By: Esmeralda Hedge M.D.   On: 01/13/2024 22:18   CT CERVICAL SPINE WO CONTRAST Result Date: 01/13/2024 CLINICAL DATA:  Ground level fall on Plavix  EXAM: CT  HEAD WITHOUT CONTRAST CT MAXILLOFACIAL WITHOUT CONTRAST CT CERVICAL SPINE WITHOUT CONTRAST TECHNIQUE: Multidetector CT imaging of the head, cervical spine, and maxillofacial structures were performed using the standard protocol without intravenous contrast. Multiplanar CT image reconstructions of the cervical spine and maxillofacial structures were also generated. RADIATION DOSE REDUCTION: This exam was performed according to the departmental dose-optimization program which includes automated exposure control, adjustment of the mA and/or kV according to patient size and/or use of iterative reconstruction technique. COMPARISON:  None Available. FINDINGS: CT HEAD FINDINGS Brain: Large right-sided acute subdural hematoma, measures up to 2.1 cm maximum thickness on coronal series 5, image 48. Mass effect on underlying brain parenchyma, about 13 mm midline shift to the left. Compression of the right lateral and third ventricles with slight asymmetrical enlargement of the left atrium of the lateral ventricle. Small volume acute left convexity subdural hematoma, measures 5 mm maximum thickness on series 5, image 50.  Trace posterior inter hemispheric subdural blood, coronal series 5, image 22 and small volume blood layering along the right tentorium, coronal series 5, image 20. Vascular: No hyperdense vessels.  Carotid vascular calcification Skull: No convincing skull fracture Other: Moderate right periorbital and forehead hematoma. Moderate right posterior parietal scalp hematoma and laceration. Traumatic Brain Injury Risk Stratification Skull Fracture: No - Low/mBIG 1 Subdural Hematoma (SDH): 8mm plus - High/mBIG 3 Subarachnoid Hemorrhage [facility]): No Epidural Hematoma (EDH): No - Low/mBIG 1 Cerebral contusion, intra-axial, intraparenchymal Hemorrhage (IPH): No Intraventricular Hemorrhage (IVH): No - Low/mBIG 1 Midline Shift > 1mm or Edema/effacement of sulci/vents: Yes - High/mBIG 3 ---------------------------------------------------- CT MAXILLOFACIAL FINDINGS Osseous: Mastoid air cells are clear. Mandibular heads are normally position. No mandibular fracture. Pterygoid plates and zygomatic arches appear intact. No acute nasal bone fracture. Orbits: Negative. No traumatic or inflammatory finding. Sinuses: Moderate mucosal thickening in the sinuses. Fluid and debris in the posterior nasopharynx. No acute sinus wall fracture Soft tissues: Moderate right periorbital and forehead scalp hematoma. Moderate large right posterior parietal scalp laceration and hematoma CT CERVICAL SPINE FINDINGS Alignment: No subluxation.  Facet alignment is normal Skull base and vertebrae: No acute fracture. No primary bone lesion or focal pathologic process. Soft tissues and spinal canal: Fluid in the posterior nasopharynx. No visible canal hematoma Disc levels: C1-C2 degenerative changes. Partial ankylosis C4-C5. Moderate disc space narrowing C6-C7. Multilevel facet degenerative change with foraminal narrowing. Upper chest: See separately dictated chest CT Other: None IMPRESSION: 1. Large acute right-sided subdural hematoma, measures up to 2.1 cm  maximum thickness with mass effect on the underlying brain parenchyma and approximately 13 mm midline shift to the left. Mass effect on the right lateral and third ventricles with slight asymmetrical enlargement of the left atria of the lateral ventricle. Small volume acute left convexity subdural hematoma, maximum 5 mm thickness, with trace posterior inter hemispheric subdural blood and small volume blood layering along the right tentorium. 2. No CT evidence for acute facial bone fracture. Moderate right periorbital and forehead scalp hematoma. Moderate to large right posterior parietal scalp laceration and hematoma. 3. No CT evidence for acute osseous abnormality of the cervical spine. Degenerative changes. Critical Value/emergent results were called by telephone at the time of interpretation on 01/13/2024 at 9:55 Pm to provider Dr. Elvan Hamel, Who verbally acknowledged these results. Electronically Signed   By: Esmeralda Hedge M.D.   On: 01/13/2024 22:18   DG Pelvis Portable Result Date: 01/13/2024 CLINICAL DATA:  Status post trauma. EXAM: PORTABLE PELVIS 1-2 VIEWS COMPARISON:  April 04, 2013 FINDINGS: Intact bilateral  total hip replacements are seen. A radiopaque fixation plate and screws are also seen along the proximal right femoral shaft. There is no evidence of an acute pelvic fracture or diastasis. No pelvic bone lesions are seen. IMPRESSION: 1. Intact bilateral total hip replacements. 2. No acute fracture or dislocation. Electronically Signed   By: Virgle Grime M.D.   On: 01/13/2024 22:15   DG Chest Port 1 View Result Date: 01/13/2024 CLINICAL DATA:  Status post trauma. EXAM: PORTABLE CHEST 1 VIEW COMPARISON:  December 22, 2022 FINDINGS: An endotracheal tube is seen with its distal tip approximately 4.3 cm from the carina. An enteric tube is in place with its distal tip overlying the body of the stomach. The distal side hole sits approximately 3.1 cm distal to the expected region of the gastroesophageal  junction. The heart size and mediastinal contours are within normal limits. Low lung volumes are noted. Mild atelectasis is suspected within the left lung base. There is no evidence of acute infiltrate, pleural effusion or pneumothorax. Radiopaque surgical clips are seen within the right upper quadrant. There is evidence of prior bilateral shoulder arthroplasty. Multilevel degenerative changes are present throughout the thoracic spine. IMPRESSION: 1. Endotracheal tube and enteric tube positioning, as described above. 2. Low lung volumes with mild left basilar atelectasis. Electronically Signed   By: Virgle Grime M.D.   On: 01/13/2024 22:09    Microbiology No results found for this or any previous visit (from the past 240 hours).  Lab Basic Metabolic Panel: No results for input(s): "NA", "K", "CL", "CO2", "GLUCOSE", "BUN", "CREATININE", "CALCIUM ", "MG", "PHOS" in the last 168 hours. Liver Function Tests: No results for input(s): "AST", "ALT", "ALKPHOS", "BILITOT", "PROT", "ALBUMIN" in the last 168 hours. No results for input(s): "LIPASE", "AMYLASE" in the last 168 hours. No results for input(s): "AMMONIA" in the last 168 hours. CBC: No results for input(s): "WBC", "NEUTROABS", "HGB", "HCT", "MCV", "PLT" in the last 168 hours. Cardiac Enzymes: No results for input(s): "CKTOTAL", "CKMB", "CKMBINDEX", "TROPONINI" in the last 168 hours. Sepsis Labs: No results for input(s): "PROCALCITON", "WBC", "LATICACIDVEN" in the last 168 hours.  Procedures/Operations  None   Leona Alen N Jarae Panas 01/26/2024, 1:12 PM

## 2024-12-13 ENCOUNTER — Ambulatory Visit
